# Patient Record
Sex: Male | Born: 1940 | Race: White | Hispanic: No | Smoking: Former smoker
Health system: Southern US, Community
[De-identification: ages and names within clinical notes are randomized; demographics above are authoritative.]

## PROBLEM LIST (undated history)

## (undated) DIAGNOSIS — I420 Dilated cardiomyopathy: Secondary | ICD-10-CM

## (undated) DIAGNOSIS — Z9289 Personal history of other medical treatment: Secondary | ICD-10-CM

## (undated) DIAGNOSIS — D759 Disease of blood and blood-forming organs, unspecified: Secondary | ICD-10-CM

## (undated) DIAGNOSIS — Z9889 Other specified postprocedural states: Secondary | ICD-10-CM

## (undated) DIAGNOSIS — I10 Essential (primary) hypertension: Secondary | ICD-10-CM

## (undated) DIAGNOSIS — E119 Type 2 diabetes mellitus without complications: Secondary | ICD-10-CM

## (undated) DIAGNOSIS — E669 Obesity, unspecified: Secondary | ICD-10-CM

## (undated) DIAGNOSIS — K219 Gastro-esophageal reflux disease without esophagitis: Secondary | ICD-10-CM

## (undated) DIAGNOSIS — C9110 Chronic lymphocytic leukemia of B-cell type not having achieved remission: Principal | ICD-10-CM

## (undated) DIAGNOSIS — E781 Pure hyperglyceridemia: Secondary | ICD-10-CM

## (undated) DIAGNOSIS — D72820 Lymphocytosis (symptomatic): Secondary | ICD-10-CM

## (undated) HISTORY — PX: FOOT SURGERY: SHX648

## (undated) HISTORY — DX: Pure hyperglyceridemia: E78.1

## (undated) HISTORY — PX: NASAL SINUS SURGERY: SHX719

## (undated) HISTORY — DX: Chronic lymphocytic leukemia of B-cell type not having achieved remission: C91.10

## (undated) HISTORY — PX: OTHER SURGICAL HISTORY: SHX169

## (undated) HISTORY — PX: TONSILLECTOMY: SUR1361

## (undated) HISTORY — DX: Obesity, unspecified: E66.9

## (undated) HISTORY — DX: Lymphocytosis (symptomatic): D72.820

## (undated) HISTORY — DX: Personal history of other medical treatment: Z92.89

## (undated) HISTORY — DX: Dilated cardiomyopathy: I42.0

## (undated) HISTORY — DX: Other specified postprocedural states: Z98.890

## (undated) HISTORY — DX: Essential (primary) hypertension: I10

## (undated) HISTORY — DX: Type 2 diabetes mellitus without complications: E11.9

---

## 1998-02-19 ENCOUNTER — Ambulatory Visit (HOSPITAL_COMMUNITY): Admission: RE | Admit: 1998-02-19 | Discharge: 1998-02-19 | Payer: Self-pay | Admitting: Cardiology

## 1998-02-23 ENCOUNTER — Ambulatory Visit (HOSPITAL_COMMUNITY): Admission: RE | Admit: 1998-02-23 | Discharge: 1998-02-23 | Payer: Self-pay | Admitting: Cardiology

## 1998-03-31 ENCOUNTER — Ambulatory Visit (HOSPITAL_COMMUNITY): Admission: RE | Admit: 1998-03-31 | Discharge: 1998-03-31 | Payer: Self-pay | Admitting: Cardiology

## 1998-04-01 ENCOUNTER — Ambulatory Visit (HOSPITAL_COMMUNITY): Admission: RE | Admit: 1998-04-01 | Discharge: 1998-04-01 | Payer: Self-pay | Admitting: Cardiology

## 1998-12-30 ENCOUNTER — Ambulatory Visit (HOSPITAL_COMMUNITY): Admission: RE | Admit: 1998-12-30 | Discharge: 1998-12-30 | Payer: Self-pay | Admitting: Gastroenterology

## 2002-09-03 ENCOUNTER — Encounter: Admission: RE | Admit: 2002-09-03 | Discharge: 2002-09-03 | Payer: Self-pay | Admitting: Internal Medicine

## 2002-09-03 ENCOUNTER — Encounter: Payer: Self-pay | Admitting: Internal Medicine

## 2003-06-03 ENCOUNTER — Ambulatory Visit: Admission: RE | Admit: 2003-06-03 | Discharge: 2003-06-03 | Payer: Self-pay | Admitting: Family Medicine

## 2003-09-30 ENCOUNTER — Encounter: Admission: RE | Admit: 2003-09-30 | Discharge: 2003-12-29 | Payer: Self-pay | Admitting: Internal Medicine

## 2007-02-19 ENCOUNTER — Encounter (HOSPITAL_BASED_OUTPATIENT_CLINIC_OR_DEPARTMENT_OTHER): Admission: RE | Admit: 2007-02-19 | Discharge: 2007-05-20 | Payer: Self-pay | Admitting: Surgery

## 2007-02-21 ENCOUNTER — Encounter: Admission: RE | Admit: 2007-02-21 | Discharge: 2007-02-21 | Payer: Self-pay | Admitting: Internal Medicine

## 2010-01-18 ENCOUNTER — Encounter: Admission: RE | Admit: 2010-01-18 | Discharge: 2010-01-18 | Payer: Self-pay | Admitting: Orthopedic Surgery

## 2011-02-01 NOTE — Assessment & Plan Note (Signed)
[facility]   NAME:  Derek [name], Derek [name] NO.:  [id number]   MEDICAL RECORD NO.:  [id number]      DATE OF BIRTH:  04-Jun-1941   PHYSICIAN:  Epifania Gore. Nils Pyle, M.D. 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  In the interim we have treated him with Aquacel  Silver and a Covaderm dressing.  There has been no excessive drainage or  malodor.  He has not had pain.   OBJECTIVE:  Blood pressure is 145/75.  Respirations 18, pulse rate 73,  temperature 98.1.  Inspection of the right lateral ankle shows that  there has been some contraction in the wound.  There is 100% granulation  with early epithelial advancement from the periphery.   ASSESSMENT:  Clinically improved.   PLAN:  The patient is going on vacation for 2 weeks in [place].  We are  recommending that he utilize antiseptic soap washes twice a day.  Use a  Covaderm dressing and continue to use the modified shoe that is cut out  to avoid pressure over the wound.  We will reevaluate him in 2 weeks.  If he develops increased redness, pain or drainage, he should be  reevaluated in [facility].  The patient seems to understand.  We have  given him an opportunity to ask questions.  He expresses gratitude for  having been seen in the clinic and indicates he will be compliant.      [name], M.D.  Electronically Signed     HAN/MEDQ  D:  03/07/2007  T:  03/07/2007  Job:  [id number]

## 2011-02-01 NOTE — Assessment & Plan Note (Signed)
[facility]   NAME:  Derek, [name] NO.:  [id number]   MEDICAL RECORD NO.:  [id number]      DATE OF BIRTH:  06-Jan-1941   PHYSICIAN:  Epifania Gore. Nils Pyle, M.D.      VISIT DATE:                                   OFFICE VISIT   SUBJECTIVE:  [name] was initially seen on February 19, 2007 with a  neuropathic ulcer involving the right ankle.  The wound was debrided in  the clinic and we provided him with pressure relief, i.e. offloading by  utilizing a healing sandal and a bulky dressing.  He returns for  followup.  He denies interim pain, excessive drainage, malodor or fever.   OBJECTIVE:  Blood pressure is 134/81, respirations are 20, pulse rate  63, temperature 98.4.  The patient is unaccompanied.   Inspection of the right lower extremity and foot shows that the wound,  in fact, appears cleaner.  There is no evidence of infection.  There  appears to be healthy well-perfused tissue in the previous area of  debridement.  No epithelialization is evident at this point.  The ankle  brachial index performed today in the clinic is 1.31 and 0.93 on the  left and right, respectively.   IMPRESSION:  Clinical improvement responding to adequate offloading.   PLAN:  The patient is insistent upon wearing a cut away of his regular  shoe; this will allow him to continue to work.  For the wound itself, we  would dress it with a double Prisma dressing, hydrogel and Covaderm.  The patient will cut away his shoe to provide for no compression  whatsoever over the wound.  We will reevaluate the patient in one week.  We have counseled the patient with regard to the possibility of  excessive drainage or fever.  He is to notify the clinic if there is any  difficulty whatsoever and he is also to bring his modified shoe with him  when he returns to the clinic.  We have given him an opportunity to ask  questions, he seems to understand and indicates that he will be  compliant.      [name], M.D.  Electronically Signed     HAN/MEDQ  D:  02/21/2007  T:  02/21/2007  Job:  [id number]

## 2011-02-01 NOTE — Consult Note (Signed)
NAME:  Derek [name], Derek [name]                   ACCOUNT NO.:  [id number]   MEDICAL RECORD NO.:  [id number]          PATIENT TYPE:  REC   LOCATION:  FOOT                         FACILITY:  [facility]   PHYSICIAN:  Epifania Gore. Nils Pyle, M.D.DATE OF BIRTH:  1941/02/09   DATE OF CONSULTATION:  DATE OF DISCHARGE:                                 CONSULTATION   SUBJECTIVE:  Derek [name] is a 70 year old man referred by Dr. Deforest Hoyles for  evaluation of a non-healing ulceration on the lateral right foot.   IMPRESSION:  Neuropathic ulcer.   RECOMMENDATIONS:  We have initiated debridement in the wound center,  followed by the placement of Promogran with hydrogel.  We will offload  the patient with a healing sandal and re-evaluate him in 2-3 days, to  assess his response to the therapy.   SUBJECTIVE:  Derek [name] is a 70 year old man who underwent a plastic re-  construction of his right foot approximately 15 years ago.  The  resulting repair gave him a grossly deformed foot.   FOLLOWUP:  In an attempt to increase his activity, he started walking  more and noted a rub at the inferior border of the lateral malleolus.  The wound continued to enlarge, in spite of the application of topical  agents.  He was seen by Dr. Deforest Hoyles, who referred him to the wound  center for consultation.   PAST MEDICAL HISTORY:  Remarkable for the patient's hypertension.  He is  a borderline diabetic according to the patient.  He is on no oral  hypoglycemic agents, and he does not take insulin.   PREVIOUS SURGERY:  His previous surgery has included only the plastic  reconstruction of the foot and a vasectomy.   ALLERGIES:  HE DENIES ALLERGIES.   CURRENT MEDICATIONS:  Include lisinopril, HCTZ 5 mg daily, fish oil p.o.  daily and aspirin 81 mg daily.   FAMILY HISTORY:  Is positive for vascular disease, diabetes and heart  attack.   SOCIAL HISTORY:  Socially, he is married.  He does not smoke.  Drinks  occasional alcohol.  Has several  children who live in the local area.   REVIEW OF SYSTEMS:  Is remarkable for some recent a right shoulder and  chest discomfort.  He has been seen by Dr. Chase Picket and has been  told that this most likely represents a musculoskeletal pain.  He denies  bowel or bladder dysfunction.  His weight has increased 20 pounds, over  the last year.  He denies TIAs or transient paralysis.  There is no  dyspnea on exertion and no orthopnea.  The remainder of the review of  systems is negative.   PHYSICAL EXAM:  GENERAL:  He is alert, oriented, in no acute distress.  VITAL SIGNS:  Blood pressure is 163/87, respirations are 20, pulse rate  72, temperature of 98.  Capillary blood glucoses 118 mg percent.  HEENT:  Exam is clear.  NECK:  Supple.  Trachea is midline.  Thyroid is nonpalpable.  LUNGS:  Clear.  The heart sounds are normal.  ABDOMEN:  Soft.  EXTREMITY:  Exam is remarkable for mild changes of stasis bilaterally.  There is a bounding 3+ dorsalis pedis pulse on the left, with intact  sensation on the right.  The postsurgical changes of a myocutaneous flap  to the dorsal lateral aspect of the right foot are evident.  At the  lateral edge of the flap is an open ulcerated area, which was  photographed and measured and catalogued.  This wound had some non-  viable tissue at the periphery and the central areas.  This underwent a  short debridement, without particular tenderness.  There was resultant  bleeding.  There was no evidence of infection.  Capillary refill was  normal.  Neurologically, sensation was retained on the volar aspect of  the foot.  There were areas of decreased sensation associated with the  grafted skin.   DISCUSSION:  The patient's wound appears to be directly related to the  relatively insensate status of the wound and likely related to repeated  trauma from mal-fitting shoes.  Our initial attempt will be to offload  the friction-bearing area by placing the patient in a  bulky wrap with a  matrix dressing and a healing sandal.  We will re-evaluate him and 3  days.  We have explained this approach to the patient, in terms that he  seems to understand.  Our long-term objective will be to get this wound  initially healed and to have him placed in customized footwear.  The  patient seems to understand.  We have given him an opportunity to ask  questions.  He expresses gratitude for having been seen in the clinic  and indicates that he will be compliant as outlined above.      [name], M.D.  Electronically Signed     HAN/MEDQ  D:  02/19/2007  T:  02/19/2007  Job:  [id number]   cc:   Wenda Low, MD

## 2011-02-01 NOTE — Assessment & Plan Note (Signed)
[facility]   NAME:  KATELYN, BRAXTON NO.:  [id number]   MEDICAL RECORD NO.:  [id number]      DATE OF BIRTH:  [birth year]   PHYSICIAN:  Epifania Gore. Nils Pyle, M.D.      VISIT DATE:                                   OFFICE VISIT   SUBJECTIVE:  Mr. Talamo is a 70 year old man who we are following for a  right lateral ankle neuropathic ulcer.  In the interim, he has made a  modification of his athletic shoe to avoid pressure and has continued to  wear a moist dressing and to utilize a daily shower.  There has been no  excessive drainage, malodor, pain, or fever.   OBJECTIVE:  The blood pressure is 123/68, respirations are 18, pulse  rate 72, temperature 98.4.  Inspection of the wound shows that there is  the development of healthy appearing granulation at the base of the  wound.  There is no definite epithelialization at present.  There is no  evidence of infection or vascular compromise.   ASSESSMENT:  Clinical improvement.   PLAN:  We will continue the modified athletic shoe for off-loading.  We  will begin an Aquacel silver with a coverdell dressing and re-evaluate  the patient in one week.      [name], M.D.  Electronically Signed     HAN/MEDQ  D:  02/28/2007  T:  02/28/2007  Job:  [id number]

## 2011-12-21 DIAGNOSIS — E669 Obesity, unspecified: Secondary | ICD-10-CM | POA: Diagnosis not present

## 2011-12-21 DIAGNOSIS — I10 Essential (primary) hypertension: Secondary | ICD-10-CM | POA: Diagnosis not present

## 2011-12-21 DIAGNOSIS — R21 Rash and other nonspecific skin eruption: Secondary | ICD-10-CM | POA: Diagnosis not present

## 2011-12-21 DIAGNOSIS — Z1331 Encounter for screening for depression: Secondary | ICD-10-CM | POA: Diagnosis not present

## 2011-12-21 DIAGNOSIS — E782 Mixed hyperlipidemia: Secondary | ICD-10-CM | POA: Diagnosis not present

## 2011-12-21 DIAGNOSIS — E119 Type 2 diabetes mellitus without complications: Secondary | ICD-10-CM | POA: Diagnosis not present

## 2011-12-21 DIAGNOSIS — N529 Male erectile dysfunction, unspecified: Secondary | ICD-10-CM | POA: Diagnosis not present

## 2012-01-03 DIAGNOSIS — I10 Essential (primary) hypertension: Secondary | ICD-10-CM | POA: Diagnosis not present

## 2012-01-03 DIAGNOSIS — E119 Type 2 diabetes mellitus without complications: Secondary | ICD-10-CM | POA: Diagnosis not present

## 2012-01-03 DIAGNOSIS — G479 Sleep disorder, unspecified: Secondary | ICD-10-CM | POA: Diagnosis not present

## 2012-01-13 DIAGNOSIS — N529 Male erectile dysfunction, unspecified: Secondary | ICD-10-CM | POA: Diagnosis not present

## 2012-03-08 DIAGNOSIS — IMO0002 Reserved for concepts with insufficient information to code with codable children: Secondary | ICD-10-CM | POA: Diagnosis not present

## 2012-06-18 DIAGNOSIS — E119 Type 2 diabetes mellitus without complications: Secondary | ICD-10-CM | POA: Diagnosis not present

## 2012-06-18 DIAGNOSIS — E669 Obesity, unspecified: Secondary | ICD-10-CM | POA: Diagnosis not present

## 2012-06-18 DIAGNOSIS — E782 Mixed hyperlipidemia: Secondary | ICD-10-CM | POA: Diagnosis not present

## 2012-06-18 DIAGNOSIS — L989 Disorder of the skin and subcutaneous tissue, unspecified: Secondary | ICD-10-CM | POA: Diagnosis not present

## 2012-06-18 DIAGNOSIS — I10 Essential (primary) hypertension: Secondary | ICD-10-CM | POA: Diagnosis not present

## 2012-07-09 DIAGNOSIS — H2510 Age-related nuclear cataract, unspecified eye: Secondary | ICD-10-CM | POA: Diagnosis not present

## 2012-07-09 DIAGNOSIS — E119 Type 2 diabetes mellitus without complications: Secondary | ICD-10-CM | POA: Diagnosis not present

## 2012-07-11 DIAGNOSIS — L570 Actinic keratosis: Secondary | ICD-10-CM | POA: Diagnosis not present

## 2012-09-26 DIAGNOSIS — Z23 Encounter for immunization: Secondary | ICD-10-CM | POA: Diagnosis not present

## 2012-12-18 DIAGNOSIS — R0602 Shortness of breath: Secondary | ICD-10-CM | POA: Diagnosis not present

## 2012-12-18 DIAGNOSIS — E785 Hyperlipidemia, unspecified: Secondary | ICD-10-CM | POA: Diagnosis not present

## 2012-12-18 DIAGNOSIS — E119 Type 2 diabetes mellitus without complications: Secondary | ICD-10-CM | POA: Diagnosis not present

## 2012-12-18 DIAGNOSIS — E669 Obesity, unspecified: Secondary | ICD-10-CM | POA: Diagnosis not present

## 2012-12-18 DIAGNOSIS — N529 Male erectile dysfunction, unspecified: Secondary | ICD-10-CM | POA: Diagnosis not present

## 2012-12-18 DIAGNOSIS — Z0000 Encounter for general adult medical examination without abnormal findings: Secondary | ICD-10-CM | POA: Diagnosis not present

## 2012-12-18 DIAGNOSIS — I10 Essential (primary) hypertension: Secondary | ICD-10-CM | POA: Diagnosis not present

## 2012-12-18 DIAGNOSIS — Z1331 Encounter for screening for depression: Secondary | ICD-10-CM | POA: Diagnosis not present

## 2012-12-27 DIAGNOSIS — R0602 Shortness of breath: Secondary | ICD-10-CM | POA: Diagnosis not present

## 2012-12-27 DIAGNOSIS — R079 Chest pain, unspecified: Secondary | ICD-10-CM | POA: Diagnosis not present

## 2012-12-27 DIAGNOSIS — E119 Type 2 diabetes mellitus without complications: Secondary | ICD-10-CM | POA: Diagnosis not present

## 2012-12-31 DIAGNOSIS — E119 Type 2 diabetes mellitus without complications: Secondary | ICD-10-CM | POA: Diagnosis not present

## 2012-12-31 DIAGNOSIS — R9439 Abnormal result of other cardiovascular function study: Secondary | ICD-10-CM | POA: Diagnosis not present

## 2012-12-31 DIAGNOSIS — I10 Essential (primary) hypertension: Secondary | ICD-10-CM | POA: Diagnosis not present

## 2012-12-31 DIAGNOSIS — R0602 Shortness of breath: Secondary | ICD-10-CM | POA: Diagnosis not present

## 2012-12-31 DIAGNOSIS — E785 Hyperlipidemia, unspecified: Secondary | ICD-10-CM | POA: Diagnosis not present

## 2013-01-11 ENCOUNTER — Other Ambulatory Visit: Payer: Self-pay | Admitting: Cardiology

## 2013-01-14 DIAGNOSIS — R079 Chest pain, unspecified: Secondary | ICD-10-CM | POA: Diagnosis not present
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
## 2013-01-17 ENCOUNTER — Inpatient Hospital Stay (HOSPITAL_BASED_OUTPATIENT_CLINIC_OR_DEPARTMENT_OTHER)
Admission: RE | Admit: 2013-01-17 | Discharge: 2013-01-17 | Disposition: A | Discharge status: Home / Self Care | Payer: Medicare Other | Source: Ambulatory Visit | Attending: Cardiology | Admitting: Cardiology

## 2013-01-17 ENCOUNTER — Encounter (HOSPITAL_BASED_OUTPATIENT_CLINIC_OR_DEPARTMENT_OTHER)
Admission: RE | Disposition: A | Discharge status: Home / Self Care | Payer: Self-pay | Source: Ambulatory Visit | Attending: Cardiology

## 2013-01-17 ENCOUNTER — Encounter (HOSPITAL_BASED_OUTPATIENT_CLINIC_OR_DEPARTMENT_OTHER): Payer: Self-pay | Admitting: *Deleted

## 2013-01-17 DIAGNOSIS — R9430 Abnormal result of cardiovascular function study, unspecified: Secondary | ICD-10-CM | POA: Diagnosis not present

## 2013-01-17 DIAGNOSIS — I2510 Atherosclerotic heart disease of native coronary artery without angina pectoris: Secondary | ICD-10-CM | POA: Insufficient documentation

## 2013-01-17 DIAGNOSIS — I209 Angina pectoris, unspecified: Secondary | ICD-10-CM | POA: Diagnosis not present

## 2013-01-17 DIAGNOSIS — I10 Essential (primary) hypertension: Secondary | ICD-10-CM | POA: Insufficient documentation

## 2013-01-17 DIAGNOSIS — R9439 Abnormal result of other cardiovascular function study: Secondary | ICD-10-CM | POA: Insufficient documentation

## 2013-01-17 DIAGNOSIS — E119 Type 2 diabetes mellitus without complications: Secondary | ICD-10-CM | POA: Insufficient documentation

## 2013-01-17 DIAGNOSIS — Z6841 Body Mass Index (BMI) 40.0 and over, adult: Secondary | ICD-10-CM | POA: Insufficient documentation

## 2013-01-17 DIAGNOSIS — E785 Hyperlipidemia, unspecified: Secondary | ICD-10-CM | POA: Insufficient documentation

## 2013-01-17 DIAGNOSIS — R0602 Shortness of breath: Secondary | ICD-10-CM | POA: Insufficient documentation

## 2013-01-17 LAB — POCT I-STAT GLUCOSE
Glucose, Bld: 128 mg/dL — ABNORMAL HIGH (ref 70–99)
Operator id: 274661

## 2013-01-17 SURGERY — JV LEFT HEART CATHETERIZATION WITH CORONARY ANGIOGRAM
Anesthesia: Moderate Sedation

## 2013-01-17 MED ORDER — ONDANSETRON HCL 4 MG/2ML IJ SOLN: 4.0000 mg | Freq: Four times a day (QID) | INTRAMUSCULAR | Status: DC | PRN

## 2013-01-17 MED ORDER — METOPROLOL SUCCINATE ER 50 MG PO TB24: 50.0000 mg | ORAL_TABLET | Freq: Every day | ORAL | Status: DC

## 2013-01-17 MED ORDER — SODIUM CHLORIDE 0.9 % IV SOLN: 1.0000 mL/kg/h | INTRAVENOUS | Status: AC

## 2013-01-17 MED ORDER — ACETAMINOPHEN 325 MG PO TABS: 650.0000 mg | ORAL_TABLET | ORAL | Status: DC | PRN

## 2013-01-17 MED ORDER — ASPIRIN 81 MG PO CHEW
324.0000 mg | CHEWABLE_TABLET | ORAL | Status: AC
  Administered 2013-01-17: 324 mg via ORAL

## 2013-01-17 MED ORDER — SODIUM CHLORIDE 0.9 % IV SOLN: 250.0000 mL | INTRAVENOUS | Status: DC | PRN

## 2013-01-17 MED ORDER — SODIUM CHLORIDE 0.9 % IV SOLN
INTRAVENOUS | Status: DC
  Administered 2013-01-17: 09:00:00 via INTRAVENOUS

## 2013-01-17 MED ORDER — SODIUM CHLORIDE 0.9 % IJ SOLN: 3.0000 mL | Freq: Two times a day (BID) | INTRAMUSCULAR | Status: DC

## 2013-01-17 MED ORDER — SODIUM CHLORIDE 0.9 % IJ SOLN: 3.0000 mL | INTRAMUSCULAR | Status: DC | PRN

## 2013-01-17 MED ORDER — ISOSORBIDE MONONITRATE ER 30 MG PO TB24: 30.0000 mg | ORAL_TABLET | Freq: Every day | ORAL | Status: DC

## 2013-01-17 MED ORDER — DIAZEPAM 5 MG PO TABS
5.0000 mg | ORAL_TABLET | ORAL | Status: AC
  Administered 2013-01-17: 5 mg via ORAL

## 2013-01-17 NOTE — Interval H&P Note (Signed)
History and Physical Interval Note:  01/17/2013 9:39 AM  Derek [name]  has presented today for surgery, with the diagnosis of abn stress test  The various methods of treatment have been discussed with the patient and family. After consideration of risks, benefits and other options for treatment, the patient has consented to  Procedure(s): JV LEFT HEART CATHETERIZATION WITH CORONARY ANGIOGRAM (N/A) as a surgical intervention .  The patient's history has been reviewed, patient examined, no change in status, stable for surgery.  I have reviewed the patient's chart and labs.  Questions were answered to the patient's satisfaction.     Ewald Beg
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
## 2013-01-17 NOTE — CV Procedure (Signed)
CARDIAC CATHETERIZATION  PROCEDURE:  Left heart catheterization with selective coronary angiography, left ventriculogram via the radial artery approach.  INDICATIONS:  72 year old male with diabetes, hypertension, morbid obesity with weight of 318 pounds, who was referred for stress test secondary to increasing dyspnea on exertion with right upper chest/shoulder discomfort upon minimal activity, CCS class 2-3 angina. His stress test was overall moderate risk with inferolateral mid to apex ischemia.  The risks, benefits, and details of the procedure were explained to the patient, including possibilities of stroke, heart attack, death, renal impairment, arterial damage, bleeding.  The patient verbalized understanding and wanted to proceed.  Informed written consent was obtained.  PROCEDURE TECHNIQUE:  Allen's test was performed pre-and post procedure and was normal. The right radial artery site was prepped and draped in a sterile fashion. One percent lidocaine was used for local anesthesia. Using the modified Seldinger technique a 5 French hydrophilic sheath was inserted into the radial artery without difficulty. 3 mg of verapamil was administered via the sheath. A Judkins right #4 catheter with the guidance of a Versicore wire was placed in the right coronary cusp and selectively cannulated the right coronary artery. After traversing the aortic arch, 5000 units of heparin IV was administered. A Judkins left #3.5 catheter was used to selectively cannulate the left main artery. Multiple views with hand injection of Omnipaque were obtained. Catheter a pigtail catheter was used to cross into the left ventricle, hemodynamics were obtained, and a left ventriculogram was performed in the RAO position with power injection. Following the procedure, sheath was removed, patient was hemodynamically stable, hemostasis was maintained with a Terumo T band.   CONTRAST:  Total of 75 ml.    FLOUROSCOPY TIME: 4.0  min.  COMPLICATIONS:  None.    HEMODYNAMICS:  Aortic pressure was 123456 mmHg; LV systolic pressure was 123456 mmHg; LVEDP 6 mmHg.  There was no gradient between the left ventricle and aorta.    ANGIOGRAPHIC DATA:    Left main: Widely patent vessel giving rise to LAD and circumflex.  Left anterior descending (LAD): The proximal vessel demonstrates only minor luminal irregularities. In the mid vessel, there is a bifurcation of a large diagonal branch. Immediately after this bifurcation, there is LAD stenosis of approximately 85%, then 15 mm distally, stenosis of 95%, calcified, then in the distal region of the vessel, there is diffuse disease with after nitroglycerin demonstrates 2-3 focal regions of 80-90% stenosis in the small caliber distal vessel. The large diagonal branch mentioned above, demonstrates 2 focal areas of 85% stenosis in the proximal/mid vessel.  Circumflex artery (CIRC): The circumflex artery in its proximal region demonstrates minor luminal irregularities then in obtuse marginal 2 demonstrates a mid occlusion with apparent left to left collaterals filling retrograde. The mid to distal segment of the circumflex is fairly small in caliber.  Right coronary artery (RCA): Dominant vessel giving rise to the posterior descending artery. Minor luminal irregularities throughout the proximal to distal RCA. There is a small caliber posterior lateral branch with 90% proximal stenosis. There is also collateral blood flow, right to right filling a segmental posterior lateral branch.  LEFT VENTRICULOGRAM:  Left ventricular angiogram was done in the 30 RAO projection and revealed normal left ventricular wall motion and systolic function with an estimated ejection fraction of 55%.   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 Nitroglycerin administered with mild increase in caliber of the distal vessel. There is also small caliber posterior lateral branch 90% proximal stenosis, collateral blood flow. There is small caliber obtuse marginal branch stenosis/occlusion as well with left to left collaterals filling retrograde. See above for details. Normal left ventricular systolic function.  LVEDP 6 mmHg.  Ejection fraction 55%. No significant wall motion abnormalities.  RECOMMENDATION:  We will first initiate metoprolol as well as isosorbide to provide anti anginal therapy. We will reevaluate next week. I discussed case with Dr. Irish Lack of interventional cardiology. We discussed the potential plan for percutaneous intervention to the mid LAD segment as well as intervention to the proximal/mid large caliber diagonal branch and potential balloon angioplasty to distal LAD. Once again, nuclear stress test was deemed moderate risk-he did have mild electrical instability with brief run of possible ventricular tachycardia that was low and he was also noted to have significant dyspnea on exertion with minimal effort, CCS class III.

## 2013-01-17 NOTE — Progress Notes (Signed)
Pt received from cath procedure, alert and oriented, denies any discomfort, and  with right radial site intact, and no problems at site.  Pt tolerated fluid well.  Dr Derl Barrow in to talk to pt and family about the results of test and plan of care.

## 2013-01-24 DIAGNOSIS — I2510 Atherosclerotic heart disease of native coronary artery without angina pectoris: Secondary | ICD-10-CM | POA: Diagnosis not present

## 2013-01-24 DIAGNOSIS — E119 Type 2 diabetes mellitus without complications: Secondary | ICD-10-CM | POA: Diagnosis not present

## 2013-01-24 DIAGNOSIS — I10 Essential (primary) hypertension: Secondary | ICD-10-CM | POA: Diagnosis not present

## 2013-01-24 DIAGNOSIS — E782 Mixed hyperlipidemia: Secondary | ICD-10-CM | POA: Diagnosis not present

## 2013-02-13 DIAGNOSIS — D72829 Elevated white blood cell count, unspecified: Secondary | ICD-10-CM | POA: Diagnosis not present

## 2013-02-15 ENCOUNTER — Telehealth: Payer: Self-pay | Admitting: Oncology

## 2013-02-15 NOTE — Telephone Encounter (Signed)
LVOM FOR PT TO RETURN CALL IN RE NP APPT.  °

## 2013-02-18 ENCOUNTER — Telehealth: Payer: Self-pay | Admitting: Oncology

## 2013-02-18 NOTE — Telephone Encounter (Signed)
S/W PT IN RE TO NP APPT 06/23 @ 10:30 W/DR. HA REFERRING DR. D[place] Ar HUSAIN DX- [place] PACKET MAILED.

## 2013-02-18 NOTE — Telephone Encounter (Signed)
C/D 02/18/13 for appt. 03/11/13

## 2013-03-11 ENCOUNTER — Other Ambulatory Visit (HOSPITAL_BASED_OUTPATIENT_CLINIC_OR_DEPARTMENT_OTHER): Payer: Medicare Other | Admitting: Lab

## 2013-03-11 ENCOUNTER — Ambulatory Visit: Payer: Medicare Other

## 2013-03-11 ENCOUNTER — Encounter: Payer: Self-pay | Admitting: Oncology

## 2013-03-11 ENCOUNTER — Other Ambulatory Visit (HOSPITAL_COMMUNITY)
Admission: RE | Admit: 2013-03-11 | Discharge: 2013-03-11 | Disposition: A | Discharge status: Home / Self Care | Payer: Medicare Other | Source: Ambulatory Visit | Attending: Oncology | Admitting: Oncology

## 2013-03-11 ENCOUNTER — Ambulatory Visit (HOSPITAL_BASED_OUTPATIENT_CLINIC_OR_DEPARTMENT_OTHER): Payer: Medicare Other | Admitting: Oncology

## 2013-03-11 ENCOUNTER — Telehealth: Payer: Self-pay | Admitting: Oncology

## 2013-03-11 VITALS — BP 130/72 | HR 62 | Temp 96.9°F | Resp 19 | Ht 76.0 in | Wt 318.6 lb

## 2013-03-11 DIAGNOSIS — D72820 Lymphocytosis (symptomatic): Secondary | ICD-10-CM

## 2013-03-11 DIAGNOSIS — D72819 Decreased white blood cell count, unspecified: Secondary | ICD-10-CM | POA: Diagnosis not present

## 2013-03-11 LAB — CBC WITH DIFFERENTIAL/PLATELET
BASO%: 0.8 % (ref 0.0–2.0)
Basophils Absolute: 0.1 10*3/uL (ref 0.0–0.1)
EOS%: 0.6 % (ref 0.0–7.0)
Eosinophils Absolute: 0.1 10*3/uL (ref 0.0–0.5)
HCT: 39.5 % (ref 38.4–49.9)
HGB: 13.5 g/dL (ref 13.0–17.1)
LYMPH%: 51.1 % — ABNORMAL HIGH (ref 14.0–49.0)
MCH: 30.2 pg (ref 27.2–33.4)
MCHC: 34.2 g/dL (ref 32.0–36.0)
MCV: 88.2 fL (ref 79.3–98.0)
MONO#: 0.6 10*3/uL (ref 0.1–0.9)
MONO%: 4.6 % (ref 0.0–14.0)
NEUT#: 5.9 10*3/uL (ref 1.5–6.5)
NEUT%: 42.9 % (ref 39.0–75.0)
Platelets: 175 10*3/uL (ref 140–400)
RBC: 4.47 10*6/uL (ref 4.20–5.82)
RDW: 13.8 % (ref 11.0–14.6)
WBC: 13.9 10*3/uL — ABNORMAL HIGH (ref 4.0–10.3)
lymph#: 7.1 10*3/uL — ABNORMAL HIGH (ref 0.9–3.3)

## 2013-03-11 LAB — MORPHOLOGY: PLT EST: ADEQUATE

## 2013-03-11 LAB — CHCC SMEAR

## 2013-03-11 LAB — COMPREHENSIVE METABOLIC PANEL (CC13)
ALT: 12 U/L (ref 0–55)
AST: 14 U/L (ref 5–34)
Albumin: 3.6 g/dL (ref 3.5–5.0)
Alkaline Phosphatase: 62 U/L (ref 40–150)
BUN: 11.1 mg/dL (ref 7.0–26.0)
CO2: 25 mEq/L (ref 22–29)
Calcium: 9.7 mg/dL (ref 8.4–10.4)
Chloride: 101 mEq/L (ref 98–107)
Creatinine: 1.1 mg/dL (ref 0.7–1.3)
Glucose: 173 mg/dl — ABNORMAL HIGH (ref 70–99)
Potassium: 4.2 mEq/L (ref 3.5–5.1)
Sodium: 137 mEq/L (ref 136–145)
Total Bilirubin: 1 mg/dL (ref 0.20–1.20)
Total Protein: 7.2 g/dL (ref 6.4–8.3)

## 2013-03-11 LAB — LACTATE DEHYDROGENASE (CC13): LDH: 109 U/L — ABNORMAL LOW (ref 125–245)

## 2013-03-11 NOTE — Progress Notes (Signed)
[place]  Telephone:(336) [phone] Fax:(336) [place]    Referral MD:  Wenda Low, M.D.  Reason for Referral:  Lymphocyte-predominant lymphocytosis.     HPI:  Derek [name] is a 72 year-old man with no significant PMH.  He was noted to have leukocytosis on CBC on 01/14/13 with WBC 15.2; Hgb 14; plt 186.  He was kindly referred to the [facility] for evaluation.  Mr. Kishi presented to the clinic for the first time today with a daughter-in-law.  He denied any problem.  He denied fever, anorexia, weight loss, fatigue, headache, visual changes, confusion, drenching night sweats, palpable lymph node swelling, mucositis, odynophagia, dysphagia, nausea vomiting, jaundice, chest pain, palpitation, shortness of breath, dyspnea on exertion, productive cough, gum bleeding, epistaxis, hematemesis, hemoptysis, abdominal pain, abdominal swelling, early satiety, melena, hematochezia, hematuria, skin rash, spontaneous bleeding, joint swelling, joint pain, heat or cold intolerance, bowel bladder incontinence, back pain, focal motor weakness, paresthesia, depression.      Past Medical History  Diagnosis Date  . Lymphocytosis   . Hypertension   . Diabetes mellitus, type II   :    Past Surgical History  Procedure Laterality Date  . Foot surgery Right   . Vericose vein stripping    . Nasal sinus surgery    :   CURRENT MEDS: Current Outpatient Prescriptions  Medication Sig Dispense Refill  . atorvastatin (LIPITOR) 40 MG tablet Take 40 mg by mouth daily.      . Cholecalciferol (VITAMIN D-3 PO) Take by mouth.      . fish oil-omega-3 fatty acids 1000 MG capsule Take 1 g by mouth daily.      . isosorbide mononitrate (IMDUR) 30 MG 24 hr tablet Take 1 tablet (30 mg total) by mouth daily.  30 tablet  12  . lisinopril-hydrochlorothiazide (PRINZIDE,ZESTORETIC) 10-12.5 MG per tablet Take 1 tablet by mouth daily.      . metFORMIN  (GLUCOPHAGE) 500 MG tablet Take 500 mg by mouth 3 (three) times daily with meals.      . metoprolol succinate (TOPROL XL) 50 MG 24 hr tablet Take 1 tablet (50 mg total) by mouth daily. Take with or immediately following a meal.  30 tablet  12   No current facility-administered medications for this visit.      No Known Allergies:  Family History  Problem Relation Age of Onset  . Heart attack Mother   . Cancer Father     liver  :  History   Social History  . Marital Status: Married    Spouse Name: N/A    Number of Children: 3  . Years of Education: N/A   Occupational History  .      retied Audiological scientist; Materials engineer work    Social History Main Topics  . Smoking status: Former Smoker -- 3.00 packs/day for 20 years    Quit date: 09/19/1988  . Smokeless tobacco: Never Used  . Alcohol Use: Yes  . Drug Use: Not on file  . Sexually Active: Not on file   Other Topics Concern  . Not on file   Social History Narrative  . No narrative on file  :  REVIEW OF SYSTEM:  The rest of the 14-point review of sytem was negative.   Exam: ECOG 0  General:  Obese man, in no acute distress.  Eyes:  no scleral icterus.  ENT:  There were no oropharyngeal lesions.  Neck was without thyromegaly.  Lymphatics:  Negative cervical, supraclavicular or axillary adenopathy.  Respiratory: lungs were clear bilaterally without wheezing or crackles.  Cardiovascular:  Regular rate and rhythm, S1/S2, without murmur, rub or gallop.  There was no pedal edema.  GI:  abdomen was soft, flat, nontender, nondistended, without organomegaly.  Muscoloskeletal:  no spinal tenderness of palpation of vertebral spine.  Skin exam was without echymosis, petichae.  Neuro exam was nonfocal.  Patient was able to get on and off exam table without assistance.  Gait was normal.  Patient was alerted and oriented.  Attention was good.   Language was appropriate.  Mood was normal without depression.  Speech was not pressured.  Thought content was  not tangential.    LABS:  Lab Results  Component Value Date   WBC 13.9* 03/11/2013   HGB 13.5 03/11/2013   HCT 39.5 03/11/2013   PLT 175 03/11/2013   GLUCOSE 173* 03/11/2013   ALT 12 03/11/2013   AST 14 03/11/2013   NA 137 03/11/2013   K 4.2 03/11/2013   CL 101 03/11/2013   CREATININE 1.1 03/11/2013   BUN 11.1 03/11/2013   CO2 25 03/11/2013    Blood smear review:   I personally reviewed the patient's peripheral blood smear today.  There was isocytosis.  There was no peripheral blast.  There was moderate atypical lymphocytes. There was no schistocytosis, spherocytosis, target cell, rouleaux formation, tear drop cell.  There was no giant platelets or platelet clumps.      ASSESSMENT AND PLAN:   1.  Issue:  Elevated white blood cell. 2.  Potential causes:  Chronic leukemia/lymphoma such as CLL (chronic lymphocytic leukemia).  This can also be due to chronic, benign elevation in white blood cell not related to any bone marrow problem.  3.  Work up:  Peripheral blood flow cytometry to determine the diagnosis. 4.  If CLL is confirmed, I recommend watchful observation and monitor.  Patients with CLL need to be treated when they have severe constitutional symptoms, low blood count, massive lymph nodes, and recurrent infection. 5.  Follow up:  In about 4 months.   I informed Derek [name] that I am leaving the practice.  The [facility] will arrange for him to see another provider when he returns.    The length of time of the face-to-face encounter was 30 minutes. More than 50% of time was spent counseling and coordination of care.     Thank you for this referral.

## 2013-03-11 NOTE — Patient Instructions (Addendum)
1.  Issue:  Elevated white blood cell. 2.  Potential causes:  Chronic (not acute) leukemia/lymphoma such as CLL (chronic lymphocytic leukemia).  This can also be chronic, benign elevation in white blood cell not related to any bone marrow problem.  3.  Work up:  Peripheral blood flow cytometry to determine the diagnosis. 4.  If CLL is confirmed, I recommend watchful observation and monitor.  Patients with CLL need to be treated when they have severe constitutional symptoms, low blood count, massive lymph nodes, and recurrent infection. 5.  Follow up:  In about 4 months.

## 2013-03-11 NOTE — Progress Notes (Signed)
Checked in new patient. Wants all communication via phone/mail. No POA/living will. No financial issues. He will be ordering new medicare and BCBS card. All scanned in.

## 2013-03-13 ENCOUNTER — Encounter: Payer: Self-pay | Admitting: Oncology

## 2013-03-13 LAB — FLOW CYTOMETRY

## 2013-03-15 DIAGNOSIS — E785 Hyperlipidemia, unspecified: Secondary | ICD-10-CM | POA: Diagnosis not present

## 2013-03-15 DIAGNOSIS — I10 Essential (primary) hypertension: Secondary | ICD-10-CM | POA: Diagnosis not present

## 2013-03-15 DIAGNOSIS — E669 Obesity, unspecified: Secondary | ICD-10-CM | POA: Diagnosis not present

## 2013-03-15 DIAGNOSIS — E782 Mixed hyperlipidemia: Secondary | ICD-10-CM | POA: Diagnosis not present

## 2013-03-15 DIAGNOSIS — I2510 Atherosclerotic heart disease of native coronary artery without angina pectoris: Secondary | ICD-10-CM | POA: Diagnosis not present

## 2013-04-23 ENCOUNTER — Telehealth: Payer: Self-pay | Admitting: Oncology

## 2013-04-23 NOTE — Telephone Encounter (Signed)
pt called to set up appt.Marland KitchenMarland KitchenDone.Marland Kitchenok per Lanelle Bal

## 2013-04-24 ENCOUNTER — Telehealth: Payer: Self-pay | Admitting: Hematology and Oncology

## 2013-04-24 ENCOUNTER — Other Ambulatory Visit: Payer: Medicare Other | Admitting: Lab

## 2013-04-24 ENCOUNTER — Ambulatory Visit (HOSPITAL_BASED_OUTPATIENT_CLINIC_OR_DEPARTMENT_OTHER): Payer: Medicare Other | Admitting: Hematology and Oncology

## 2013-04-24 ENCOUNTER — Ambulatory Visit (HOSPITAL_BASED_OUTPATIENT_CLINIC_OR_DEPARTMENT_OTHER): Payer: Medicare Other | Admitting: Lab

## 2013-04-24 VITALS — BP 158/80 | HR 92 | Temp 98.4°F | Resp 20 | Ht 76.0 in | Wt 316.3 lb

## 2013-04-24 DIAGNOSIS — R5381 Other malaise: Secondary | ICD-10-CM

## 2013-04-24 DIAGNOSIS — R5383 Other fatigue: Secondary | ICD-10-CM | POA: Diagnosis not present

## 2013-04-24 DIAGNOSIS — D72829 Elevated white blood cell count, unspecified: Secondary | ICD-10-CM | POA: Diagnosis not present

## 2013-04-24 DIAGNOSIS — C9110 Chronic lymphocytic leukemia of B-cell type not having achieved remission: Secondary | ICD-10-CM | POA: Diagnosis not present

## 2013-04-24 LAB — CBC WITH DIFFERENTIAL/PLATELET
BASO%: 0.6 % (ref 0.0–2.0)
Basophils Absolute: 0.1 10*3/uL (ref 0.0–0.1)
EOS%: 0.6 % (ref 0.0–7.0)
Eosinophils Absolute: 0.1 10*3/uL (ref 0.0–0.5)
HCT: 40 % (ref 38.4–49.9)
HGB: 13.6 g/dL (ref 13.0–17.1)
LYMPH%: 52 % — ABNORMAL HIGH (ref 14.0–49.0)
MCH: 30.1 pg (ref 27.2–33.4)
MCHC: 34 g/dL (ref 32.0–36.0)
MCV: 88.3 fL (ref 79.3–98.0)
MONO#: 0.9 10*3/uL (ref 0.1–0.9)
MONO%: 5.2 % (ref 0.0–14.0)
NEUT#: 7.5 10*3/uL — ABNORMAL HIGH (ref 1.5–6.5)
NEUT%: 41.6 % (ref 39.0–75.0)
Platelets: 178 10*3/uL (ref 140–400)
RBC: 4.53 10*6/uL (ref 4.20–5.82)
RDW: 13.8 % (ref 11.0–14.6)
WBC: 18.1 10*3/uL — ABNORMAL HIGH (ref 4.0–10.3)
lymph#: 9.4 10*3/uL — ABNORMAL HIGH (ref 0.9–3.3)

## 2013-04-24 LAB — TSH CHCC: TSH: 2.016 m(IU)/L (ref 0.320–4.118)

## 2013-04-24 LAB — TECHNOLOGIST REVIEW

## 2013-04-24 NOTE — Telephone Encounter (Signed)
gv an dprinted avs for pt...sent pt back to lab.Derek KitchenMarland [name]

## 2013-04-30 DIAGNOSIS — R5381 Other malaise: Secondary | ICD-10-CM | POA: Diagnosis not present

## 2013-04-30 DIAGNOSIS — E119 Type 2 diabetes mellitus without complications: Secondary | ICD-10-CM | POA: Diagnosis not present

## 2013-04-30 DIAGNOSIS — I10 Essential (primary) hypertension: Secondary | ICD-10-CM | POA: Diagnosis not present

## 2013-04-30 DIAGNOSIS — R5383 Other fatigue: Secondary | ICD-10-CM | POA: Diagnosis not present

## 2013-05-10 NOTE — Progress Notes (Signed)
ID: Ardath Sax OB: [birth year]  MR#: [id number]  [place]  Telephone:(336) [phone] Fax:(336) [phone]    OFFICE PROGRESS NOTE   Referral MD: Wenda Low, M.D.  Reason for Referral: Lymphocyte-predominant lymphocytosis.   DIAGNOSIS: CHRONIC LYMPHOCYTIC LEUKEMIA.  PAST THERAPY: . None.  CURRENT THERAPY: Watchful observation.  HPI: Mr. Derek [name]. Derek [name] is a 72 year-old man with no significant PMH. He was noted to have leukocytosis on CBC on 01/14/13 with WBC 15.2; Hgb 14; plt 186. He was kindly referred to the [facility] for evaluation.  Mr. Paolicelli presented to the clinic for the first time on 03/11/2013 with a daughter-in-law where he was evaluated by Dr. [name] who recommended flow cytometry. Results of Peripheral Blood Flow Cytometry: IMMUNOPHENOTYPING BY FLOW CYTOMETRY OF THIS PERIPHERAL BLOOD SPECIMEN SHOWS A KAPPA RESTRICTED MONOCLONAL B CELL POPULATION WHICH IS POSITIVE FOR CD19, CD20, CD21, CD22, CD23 AND SHOWS CO-EXPRESSION OF CD5 AND 20. THE PERIPHERAL SMEAR REVIEW SHOWS LYMPHOCYTOSIS AND THE FINDINGS ARE CONSISTENT WITH CHRONIC LYMPHOCYTIC LEUKEMIA.  INTERVAL HISTORY: Mr. Derek [name]. Eschete is a 72 year-old man who presented for his follow up visit. He reported no changes since last visit. He continue to have low energy level. He has mild night sweats around neck. In morning he has nasal congestion. Occasional cough. Patient complains on tingling in both feet. He denied fever, anorexia, weight loss, headache, visual changes, confusion, drenching night sweats, palpable lymph node swelling, mucositis, odynophagia, dysphagia, nausea vomiting, jaundice, chest pain, palpitation, shortness of breath, dyspnea on exertion, productive cough, gum bleeding, epistaxis, hematemesis, hemoptysis, abdominal pain, abdominal swelling, early satiety, melena, hematochezia, hematuria, skin rash, spontaneous bleeding, joint swelling, joint pain, heat or cold intolerance, bowel bladder  incontinence, back pain, focal motor weakness, paresthesia, depression.   Review of Systems  Constitutional: Positive for malaise/fatigue. Negative for fever, chills, weight loss and diaphoresis.  HENT: Positive for congestion. Negative for hearing loss, nosebleeds, sore throat, neck pain, tinnitus and ear discharge.   Eyes: Negative for blurred vision, double vision and photophobia.  Respiratory: Positive for cough. Negative for hemoptysis, sputum production, shortness of breath and wheezing.   Cardiovascular: Negative for chest pain, palpitations, orthopnea, claudication, leg swelling and PND.  Gastrointestinal: Negative for heartburn, nausea, vomiting, abdominal pain, diarrhea, constipation and blood in stool.  Genitourinary: Negative for dysuria, urgency, frequency and hematuria.  Musculoskeletal: Negative for myalgias, back pain and joint pain.  Skin: Negative for itching and rash.  Neurological: Positive for tingling. Negative for dizziness, tremors, sensory change, speech change, focal weakness, seizures, loss of consciousness, weakness and headaches.  Endo/Heme/Allergies: Does not bruise/bleed easily.  Psychiatric/Behavioral: Negative.      PAST MEDICAL HISTORY: Past Medical History  Diagnosis Date  . Lymphocytosis   . Hypertension   . Diabetes mellitus, type II   . Obesity   . Hypertriglyceridemia     PAST SURGICAL HISTORY: Past Surgical History  Procedure Laterality Date  . Foot surgery Right   . Vericose vein stripping    . Nasal sinus surgery      FAMILY HISTORY Family History  Problem Relation Age of Onset  . Heart attack Mother   . Cancer Father     liver   HEALTH MAINTENANCE: History  Substance Use Topics  . Smoking status: Former Smoker -- 3.00 packs/day for 20 years    Quit date: 09/19/1988  . Smokeless tobacco: Never Used  . Alcohol Use: Yes    No Known Allergies  Current Outpatient Prescriptions  Medication Sig Dispense Refill  . atorvastatin  (LIPITOR) 40 MG tablet Take 40 mg by mouth daily.      . Cholecalciferol (VITAMIN D-3 PO) Take by mouth.      . fish oil-omega-3 fatty acids 1000 MG capsule Take 1 g by mouth daily.      . isosorbide mononitrate (IMDUR) 30 MG 24 hr tablet Take 1 tablet (30 mg total) by mouth daily.  30 tablet  12  . lisinopril-hydrochlorothiazide (PRINZIDE,ZESTORETIC) 10-12.5 MG per tablet Take 1 tablet by mouth daily.      . metFORMIN (GLUCOPHAGE) 500 MG tablet Take 500 mg by mouth. Take 3 tablets daily      . metoprolol succinate (TOPROL XL) 50 MG 24 hr tablet Take 1 tablet (50 mg total) by mouth daily. Take with or immediately following a meal.  30 tablet  12    OBJECTIVE: Filed Vitals:   04/24/13 0902  BP: 158/80  Pulse: 92  Temp: 98.4 F (36.9 C)  Resp: 20     Body mass index is 38.52 kg/(m^2).    ECOG FS: 0  HEENT: Sclerae anicteric.  Conjunctivae were pink. Pupils round and reactive bilaterally. Oral mucosa is moist without ulceration or thrush. No occipital, submandibular, cervical, supraclavicular or axillar adenopathy. Lungs: clear to auscultation without wheezes. No rales or rhonchi. Heart: regular rate and rhythm. No murmur, gallop or rubs. Abdomen: soft, non tender. No guarding or rebound tenderness. Bowel sounds are present. No palpable hepatosplenomegaly. MSK: no focal spinal tenderness. Extremities: No clubbing or cyanosis.No calf tenderness to palpitation, no peripheral edema. The patient had grossly intact strength in upper and lower extremities Neuro: non-focal, alert and oriented to time, person and place, appropriate affect  LAB RESULTS:  CMP     Component Value Date/Time   NA 137 03/11/2013 1031   K 4.2 03/11/2013 1031   CL 101 03/11/2013 1031   CO2 25 03/11/2013 1031   GLUCOSE 173* 03/11/2013 1031   GLUCOSE 128* 01/17/2013 1040   BUN 11.1 03/11/2013 1031   CREATININE 1.1 03/11/2013 1031   CALCIUM 9.7 03/11/2013 1031   PROT 7.2 03/11/2013 1031   ALBUMIN 3.6 03/11/2013 1031   AST  14 03/11/2013 1031   ALT 12 03/11/2013 1031   ALKPHOS 62 03/11/2013 1031   BILITOT 1.00 03/11/2013 1031    Lab Results  Component Value Date   WBC 18.1* 04/24/2013   NEUTROABS 7.5* 04/24/2013   HGB 13.6 04/24/2013   HCT 40.0 04/24/2013   MCV 88.3 04/24/2013   PLT 178 04/24/2013      Chemistry      Component Value Date/Time   NA 137 03/11/2013 1031   K 4.2 03/11/2013 1031   CL 101 03/11/2013 1031   CO2 25 03/11/2013 1031   BUN 11.1 03/11/2013 1031   CREATININE 1.1 03/11/2013 1031      Component Value Date/Time   CALCIUM 9.7 03/11/2013 1031   ALKPHOS 62 03/11/2013 1031   AST 14 03/11/2013 1031   ALT 12 03/11/2013 1031   BILITOT 1.00 03/11/2013 1031     ASSESSMENT AND PLAN:  1. CLL (chronic lymphocytic leukemia).   2. We discussed indications for treatment such as severe constitutional symptoms, low blood count, massive lymph nodes, and recurrent infection. Patient was offer treatment for CLL if he has significant fatigue, but he decided to be on watchful observation. 3. Diabetes mellitus, type II. His tiredness can be secondary to not well control diabetes mellitus. I recommended patient follow with his  PCP foe control his diabetes. 4. Fatigue. We will check thyroid function today. 5. Follow up: In about 4 months.    The length of time of the face-to-face encounter was 25 minutes. More than 50% of time was spent counseling and coordination of care.   Conni Slipper, MD   04/25/2013  3:41 AM

## 2013-05-28 ENCOUNTER — Other Ambulatory Visit: Payer: Self-pay | Admitting: *Deleted

## 2013-06-19 DIAGNOSIS — E782 Mixed hyperlipidemia: Secondary | ICD-10-CM | POA: Diagnosis not present

## 2013-06-19 DIAGNOSIS — I10 Essential (primary) hypertension: Secondary | ICD-10-CM | POA: Diagnosis not present

## 2013-06-19 DIAGNOSIS — R21 Rash and other nonspecific skin eruption: Secondary | ICD-10-CM | POA: Diagnosis not present

## 2013-06-19 DIAGNOSIS — D72829 Elevated white blood cell count, unspecified: Secondary | ICD-10-CM | POA: Diagnosis not present

## 2013-06-19 DIAGNOSIS — E119 Type 2 diabetes mellitus without complications: Secondary | ICD-10-CM | POA: Diagnosis not present

## 2013-07-11 DIAGNOSIS — H2510 Age-related nuclear cataract, unspecified eye: Secondary | ICD-10-CM | POA: Diagnosis not present

## 2013-07-11 DIAGNOSIS — E119 Type 2 diabetes mellitus without complications: Secondary | ICD-10-CM | POA: Diagnosis not present

## 2013-08-27 ENCOUNTER — Encounter: Payer: Self-pay | Admitting: Cardiology

## 2013-08-27 DIAGNOSIS — E119 Type 2 diabetes mellitus without complications: Secondary | ICD-10-CM | POA: Insufficient documentation

## 2013-08-27 DIAGNOSIS — E781 Pure hyperglyceridemia: Secondary | ICD-10-CM | POA: Insufficient documentation

## 2013-08-27 DIAGNOSIS — E669 Obesity, unspecified: Secondary | ICD-10-CM | POA: Insufficient documentation

## 2013-08-27 DIAGNOSIS — I10 Essential (primary) hypertension: Secondary | ICD-10-CM | POA: Insufficient documentation

## 2013-08-28 ENCOUNTER — Encounter: Payer: Self-pay | Admitting: Cardiology

## 2013-08-28 ENCOUNTER — Ambulatory Visit (INDEPENDENT_AMBULATORY_CARE_PROVIDER_SITE_OTHER): Payer: Medicare Other | Admitting: Cardiology

## 2013-08-28 ENCOUNTER — Encounter (INDEPENDENT_AMBULATORY_CARE_PROVIDER_SITE_OTHER): Payer: Self-pay

## 2013-08-28 VITALS — BP 124/80 | HR 70 | Ht 75.0 in | Wt 311.0 lb

## 2013-08-28 DIAGNOSIS — I10 Essential (primary) hypertension: Secondary | ICD-10-CM | POA: Diagnosis not present

## 2013-08-28 DIAGNOSIS — E669 Obesity, unspecified: Secondary | ICD-10-CM

## 2013-08-28 DIAGNOSIS — E119 Type 2 diabetes mellitus without complications: Secondary | ICD-10-CM

## 2013-08-28 DIAGNOSIS — R9430 Abnormal result of cardiovascular function study, unspecified: Secondary | ICD-10-CM

## 2013-08-28 DIAGNOSIS — I2510 Atherosclerotic heart disease of native coronary artery without angina pectoris: Secondary | ICD-10-CM | POA: Diagnosis not present

## 2013-08-28 NOTE — Progress Notes (Signed)
[facility] [address], [address], [place]  [address] Phone: [phone] Fax:  [phone]  Date:  08/28/2013   ID:  Rohaan, [place] December 26, 1940, MRN [id number]  PCP:  Wenda Low, MD   History of Present Illness: Derek [name] is a 72 y.o. male with newly discovered CAD. He be complaining of right shoulder and right upper chest discomfort with walking and occasional shortness of breath with no associated diaphoresis or nausea. He has a history of diabetes, hypertension and obesity. Dr. Lysle Rubens ordered a nuclear stress test. Stress test was performed and demonstrated evidence of mild ischemia in the basal inferolateral to mid inferolateral region. Felt to be a moderate risk myocardial perfusion study. He also had during exercise a small/brief run of possible ventricular tachycardia that was slow and was noted to have significant dyspnea on exertion.  Catheterization demonstrated mid LAD 85% stenosis immediately after bifurcation of a large diagonal branch. Distal to this lesion approximately 15 mm was another 95% stenosis, calcified. In the very distal regions of the LAD there was diffuse disease up to 80%. The large diagonal branch mentioned above also demonstrated 2 focal areas of 85% stenosis of the proximal/mid vessel. Other vessels demonstrated minor luminal irregularities. Ejection fraction 55%.   After cardiac catheterization I started him on metoprolol as well as isosorbide. He states that with medication, he has not been feeling any further symptoms. No exertional discomfort at this point. He will monitor this closely. We did discuss the risks of taking Viagra with isosorbide. For the first 3 days he had a headache with isosorbide, this is improved.  On 03/15/13-he is interested in perhaps coming off of the isosorbide because of the limitations with Viagra. I'm willing to allow him to trial being off of this medication. Of course if he develops anginal-like symptoms, I would like  for him to be back on isosorbide. We have discussed at length. He is going to [facility]  08/28/13 - Left angle swelling after driving from [place]. Stopped and walked around. Some pain in right arm when walking up stairs. His isosorbide remains on his drug list. He believes he is still taking this medication.  Wt Readings from Last 3 Encounters:  08/28/13 311 lb (141.069 kg)  04/24/13 316 lb 4.8 oz (143.473 kg)  03/11/13 318 lb 9.6 oz (144.516 kg)     Past Medical History  Diagnosis Date  . Lymphocytosis   . Hypertension   . Diabetes mellitus, type II   . Obesity   . Hypertriglyceridemia     Past Surgical History  Procedure Laterality Date  . Foot surgery Right   . Vericose vein stripping    . Nasal sinus surgery      Current Outpatient Prescriptions  Medication Sig Dispense Refill  . aspirin 81 MG tablet Take 81 mg by mouth daily.      Marland Kitchen atorvastatin (LIPITOR) 40 MG tablet Take 40 mg by mouth daily.      . Cholecalciferol (VITAMIN D-3 PO) Take by mouth.      . fish oil-omega-3 fatty acids 1000 MG capsule Take 2 g by mouth daily.       . isosorbide mononitrate (IMDUR) 30 MG 24 hr tablet Take 1 tablet (30 mg total) by mouth daily.  30 tablet  12  . lisinopril-hydrochlorothiazide (PRINZIDE,ZESTORETIC) 10-12.5 MG per tablet Take 1 tablet by mouth daily.      . metFORMIN (GLUCOPHAGE) 500 MG tablet Take 2,000 mg by  mouth daily.       . metoprolol succinate (TOPROL XL) 50 MG 24 hr tablet Take 1 tablet (50 mg total) by mouth daily. Take with or immediately following a meal.  30 tablet  12   No current facility-administered medications for this visit.    Allergies:   No Known Allergies  Social History:  The patient  reports that he quit smoking about 24 years ago. He has never used smokeless tobacco. He reports that he drinks alcohol.   ROS:  Please see the history of present illness.   No syncope, no bleeding, no orthopnea, no PND    PHYSICAL EXAM: VS:  BP 124/80  Pulse  70  Ht 6\' 3"  (1.905 m)  Wt 311 lb (141.069 kg)  BMI 38.87 kg/m2 Well nourished, well developed, in no acute distress HEENT: normal Neck: no JVD Cardiac:  normal S1, S2; RRR; no murmur Lungs:  clear to auscultation bilaterally, no wheezing, rhonchi or rales Abd: soft, nontender, no hepatomegalyObese Ext: no edema Skin: warm and dry Neuro: no focal abnormalities noted  EKG:  Normal rhythm, rate 70, poor R wave progression, left axis deviation, no other changes  Cardiac catheterization 01/17/13: Stress test moderate risk with inferolateral mid to apex ischemia. 1. Mid LAD disease immediately after the bifurcation of a large diagonal branch of 85% stenosis, then 15 mm distal to that, 95% calcified stenosis, then distal LAD diffuse disease with lesions of up to 80-90%. Nitroglycerin administered with mild increase in caliber of the distal vessel. There is also small caliber posterior lateral branch 90% proximal stenosis, collateral blood flow. There is small caliber obtuse marginal branch stenosis/occlusion as well with left to left collaterals filling retrograde. See above for details. 2. Normal left ventricular systolic function. LVEDP 6 mmHg. Ejection fraction 55%. No significant wall motion abnormalities. Have discussed this with Dr. Irish Lack.   ASSESSMENT AND PLAN:  1. Coronary artery disease-currently stable on antianginal medications. Aggressive secondary prevention. Disease as described above. Reviewed. 2. Angina-atypical right arm pain with motion. Could be anginal symptoms or perhaps musculoskeletal. We will continue with anti-anginal therapy. 3. Obesity-continue with aggressive diet, exercise. He has lost approximately 7 pounds since June. 4. Hyperlipidemia-continue with atorvastatin. LFTs normal in June. Hemoglobin 13.6. Prior LDL 36. 5. Diabetes-Dr. Deforest Hoyles working on this. Medications reviewed. ACE inhibitor for renal protection. Creatinine 1.1 in June 2014.  Signed, [name],  MD [facility]  08/28/2013 9:08 AM

## 2013-08-28 NOTE — Patient Instructions (Signed)
Your physician recommends that you continue on your current medications as directed. Please refer to the Current Medication list given to you today.  Your physician wants you to follow-up in: 6 months with Dr. Skains. You will receive a reminder letter in the mail two months in advance. If you don't receive a letter, please call our office to schedule the follow-up appointment.  

## 2013-09-10 ENCOUNTER — Other Ambulatory Visit (HOSPITAL_BASED_OUTPATIENT_CLINIC_OR_DEPARTMENT_OTHER): Payer: Medicare Other

## 2013-09-10 ENCOUNTER — Encounter: Payer: Self-pay | Admitting: Hematology and Oncology

## 2013-09-10 ENCOUNTER — Ambulatory Visit (HOSPITAL_BASED_OUTPATIENT_CLINIC_OR_DEPARTMENT_OTHER): Payer: Medicare Other | Admitting: Hematology and Oncology

## 2013-09-10 ENCOUNTER — Telehealth: Payer: Self-pay | Admitting: Hematology and Oncology

## 2013-09-10 VITALS — BP 150/63 | HR 64 | Temp 98.3°F | Resp 18 | Ht 75.0 in | Wt 312.5 lb

## 2013-09-10 DIAGNOSIS — C9110 Chronic lymphocytic leukemia of B-cell type not having achieved remission: Secondary | ICD-10-CM

## 2013-09-10 DIAGNOSIS — D72820 Lymphocytosis (symptomatic): Secondary | ICD-10-CM

## 2013-09-10 DIAGNOSIS — D72819 Decreased white blood cell count, unspecified: Secondary | ICD-10-CM

## 2013-09-10 DIAGNOSIS — C8330 Diffuse large B-cell lymphoma, unspecified site: Secondary | ICD-10-CM | POA: Insufficient documentation

## 2013-09-10 DIAGNOSIS — Z85820 Personal history of malignant melanoma of skin: Secondary | ICD-10-CM | POA: Diagnosis not present

## 2013-09-10 DIAGNOSIS — D649 Anemia, unspecified: Secondary | ICD-10-CM

## 2013-09-10 HISTORY — DX: Chronic lymphocytic leukemia of B-cell type not having achieved remission: C91.10

## 2013-09-10 LAB — TECHNOLOGIST REVIEW

## 2013-09-10 LAB — COMPREHENSIVE METABOLIC PANEL (CC13)
ALT: 14 U/L (ref 0–55)
AST: 16 U/L (ref 5–34)
Albumin: 3.6 g/dL (ref 3.5–5.0)
Alkaline Phosphatase: 69 U/L (ref 40–150)
Anion Gap: 12 mEq/L — ABNORMAL HIGH (ref 3–11)
BUN: 15.5 mg/dL (ref 7.0–26.0)
CO2: 24 mEq/L (ref 22–29)
Calcium: 9.6 mg/dL (ref 8.4–10.4)
Chloride: 104 mEq/L (ref 98–109)
Creatinine: 1 mg/dL (ref 0.7–1.3)
Glucose: 131 mg/dl (ref 70–140)
Potassium: 4.2 mEq/L (ref 3.5–5.1)
Sodium: 140 mEq/L (ref 136–145)
Total Bilirubin: 0.85 mg/dL (ref 0.20–1.20)
Total Protein: 7.5 g/dL (ref 6.4–8.3)

## 2013-09-10 LAB — CBC WITH DIFFERENTIAL/PLATELET
BASO%: 0.7 % (ref 0.0–2.0)
Basophils Absolute: 0.2 10*3/uL — ABNORMAL HIGH (ref 0.0–0.1)
EOS%: 1.4 % (ref 0.0–7.0)
Eosinophils Absolute: 0.3 10*3/uL (ref 0.0–0.5)
HCT: 39 % (ref 38.4–49.9)
HGB: 12.9 g/dL — ABNORMAL LOW (ref 13.0–17.1)
LYMPH%: 69.9 % — ABNORMAL HIGH (ref 14.0–49.0)
MCH: 28.8 pg (ref 27.2–33.4)
MCHC: 33.1 g/dL (ref 32.0–36.0)
MCV: 87 fL (ref 79.3–98.0)
MONO#: 1 10*3/uL — ABNORMAL HIGH (ref 0.1–0.9)
MONO%: 4 % (ref 0.0–14.0)
NEUT#: 6.2 10*3/uL (ref 1.5–6.5)
NEUT%: 24 % — ABNORMAL LOW (ref 39.0–75.0)
Platelets: 175 10*3/uL (ref 140–400)
RBC: 4.48 10*6/uL (ref 4.20–5.82)
RDW: 14.7 % — ABNORMAL HIGH (ref 11.0–14.6)
WBC: 25.8 10*3/uL — ABNORMAL HIGH (ref 4.0–10.3)
lymph#: 18 10*3/uL — ABNORMAL HIGH (ref 0.9–3.3)

## 2013-09-10 LAB — CHCC SMEAR

## 2013-09-10 NOTE — Telephone Encounter (Signed)
appts made per 12/23 POF AVS and CAL given shh

## 2013-09-10 NOTE — Progress Notes (Signed)
[name] OFFICE PROGRESS NOTE  Patient Care Team: Wenda Low, MD as PCP - General (Internal Medicine) [name], MD as Attending Physician (Cardiology)  DIAGNOSIS: CLL, Rai stage 0  SUMMARY OF ONCOLOGIC HISTORY: This is a pleasant 72 year old gentleman was found to have lymphocytosis. Flow cytometry confirmed CLL. He was placed on observation.  INTERVAL HISTORY: Derek [name] 72 y.o. male returns for further followup. The patient denies any recent infection. He denies any new skin lesion.\ Denies any new lymphadenopathy.  I have reviewed the past medical history, past surgical history, social history and family history with the patient and they are unchanged from previous note.  ALLERGIES:  has No Known Allergies.  MEDICATIONS:  Current Outpatient Prescriptions  Medication Sig Dispense Refill  . aspirin 81 MG tablet Take 81 mg by mouth daily. Every other day      . atorvastatin (LIPITOR) 40 MG tablet Take 40 mg by mouth daily.      . fish oil-omega-3 fatty acids 1000 MG capsule Take 2 g by mouth daily.       . isosorbide mononitrate (IMDUR) 30 MG 24 hr tablet Take 1 tablet (30 mg total) by mouth daily.  30 tablet  12  . lisinopril-hydrochlorothiazide (PRINZIDE,ZESTORETIC) 10-12.5 MG per tablet Take 1 tablet by mouth daily.      . metFORMIN (GLUCOPHAGE) 500 MG tablet Take 1,000 mg by mouth 2 (two) times daily with a meal.       . metoprolol succinate (TOPROL-XL) 25 MG 24 hr tablet Take 25 mg by mouth daily.       No current facility-administered medications for this visit.    REVIEW OF SYSTEMS:   Constitutional: Denies fevers, chills or abnormal weight loss Eyes: Denies blurriness of vision Ears, nose, mouth, throat, and face: Denies mucositis or sore throat Respiratory: Denies cough, dyspnea or wheezes Cardiovascular: Denies palpitation, chest discomfort or lower extremity swelling Gastrointestinal:  Denies nausea, heartburn or change in bowel  habits Skin: Denies abnormal skin rashes Lymphatics: Denies new lymphadenopathy or easy bruising Neurological:Denies numbness, tingling or new weaknesses Behavioral/Psych: Mood is stable, no new changes  All other systems were reviewed with the patient and are negative.  PHYSICAL EXAMINATION: ECOG PERFORMANCE STATUS: 0 - Asymptomatic  Filed Vitals:   09/10/13 0827  BP: 150/63  Pulse: 64  Temp: 98.3 F (36.8 C)  Resp: 18   Filed Weights   09/10/13 0827  Weight: 312 lb 8 oz (141.749 kg)    GENERAL:alert, no distress and comfortable. The patient is morbidly obese SKIN: skin color, texture, turgor are normal, no rashes or significant lesions EYES: normal, Conjunctiva are pink and non-injected, sclera clear OROPHARYNX:no exudate, no erythema and lips, buccal mucosa, and tongue normal  NECK: supple, thyroid normal size, non-tender, without nodularity LYMPH:  no palpable lymphadenopathy in the cervical, axillary or inguinal LUNGS: clear to auscultation and percussion with normal breathing effort HEART: regular rate & rhythm and no murmurs and no lower extremity edema ABDOMEN:abdomen soft, non-tender and normal bowel sounds. Unable to appreciate splenomegaly due to morbid obesity Musculoskeletal:no cyanosis of digits and no clubbing  NEURO: alert & oriented x 3 with fluent speech, no focal motor/sensory deficits  LABORATORY DATA:  I have reviewed the data as listed    Component Value Date/Time   NA 140 09/10/2013 0812   K 4.2 09/10/2013 0812   CL 101 03/11/2013 1031   CO2 24 09/10/2013 0812   GLUCOSE 131 09/10/2013 0812   GLUCOSE 173* 03/11/2013  1031   GLUCOSE 128* 01/17/2013 1040   BUN 15.5 09/10/2013 0812   CREATININE 1.0 09/10/2013 0812   CALCIUM 9.6 09/10/2013 0812   PROT 7.5 09/10/2013 0812   ALBUMIN 3.6 09/10/2013 0812   AST 16 09/10/2013 0812   ALT 14 09/10/2013 0812   ALKPHOS 69 09/10/2013 0812   BILITOT 0.85 09/10/2013 0812    No results found for this basename:  SPEP, UPEP,  kappa and lambda light chains    Lab Results  Component Value Date   WBC 25.8* 09/10/2013   NEUTROABS 6.2 09/10/2013   HGB 12.9* 09/10/2013   HCT 39.0 09/10/2013   MCV 87.0 09/10/2013   PLT 175 09/10/2013      Chemistry      Component Value Date/Time   NA 140 09/10/2013 0812   K 4.2 09/10/2013 0812   CL 101 03/11/2013 1031   CO2 24 09/10/2013 0812   BUN 15.5 09/10/2013 0812   CREATININE 1.0 09/10/2013 0812      Component Value Date/Time   CALCIUM 9.6 09/10/2013 0812   ALKPHOS 69 09/10/2013 0812   AST 16 09/10/2013 0812   ALT 14 09/10/2013 0812   BILITOT 0.85 09/10/2013 0812      ASSESSMENT & PLAN:  #1 CLL The patient have rapidly increasing white blood cell count. I did not see a FISH panel done. I will order that with his next visit. I educated the patient's signs and symptoms to watch out for progression of CLL. In the meantime I will like to see him back in 4 months for history, blood work and physical examination. #2 very mild anemia This is new. It could be due to progression of CLL or anemia chronic disease. He is not symptomatic. We will observe for now. #3 history of skin cancer I recommend vigilance skin care and yearly dermatology visit. I recommend him to take vitamin D supplements. #4 preventive care He has received influenza vaccination this year.  Orders Placed This Encounter  Procedures  . CBC & Diff and Retic    Standing Status: Future     Number of Occurrences:      Standing Expiration Date: 09/10/2014  . Comprehensive metabolic panel    Standing Status: Future     Number of Occurrences:      Standing Expiration Date: 09/10/2014  . FISH, Peripheral Blood    CLL FISH    Standing Status: Future     Number of Occurrences:      Standing Expiration Date: 09/10/2014  . Lactate dehydrogenase    Standing Status: Future     Number of Occurrences:      Standing Expiration Date: 09/10/2014  . IgG, IgA, IgM    Standing Status: Future      Number of Occurrences:      Standing Expiration Date: 09/10/2014  . Direct antiglobulin test    Standing Status: Future     Number of Occurrences:      Standing Expiration Date: 09/10/2014   All questions were answered. The patient knows to call the clinic with any problems, questions or concerns. No barriers to learning was detected. I spent 25 minutes counseling the patient face to face. The total time spent in the appointment was 40 minutes and more than 50% was on counseling and review of test results     [facility], [place], MD 09/10/2013 8:54 AM

## 2013-09-18 ENCOUNTER — Other Ambulatory Visit (HOSPITAL_COMMUNITY): Payer: Self-pay | Admitting: Internal Medicine

## 2013-09-18 ENCOUNTER — Ambulatory Visit (HOSPITAL_COMMUNITY)
Admission: RE | Admit: 2013-09-18 | Discharge: 2013-09-18 | Disposition: A | Discharge status: Home / Self Care | Payer: Medicare Other | Source: Ambulatory Visit | Attending: Internal Medicine | Admitting: Internal Medicine

## 2013-09-18 DIAGNOSIS — L03119 Cellulitis of unspecified part of limb: Secondary | ICD-10-CM

## 2013-09-18 DIAGNOSIS — M25561 Pain in right knee: Secondary | ICD-10-CM

## 2013-09-18 DIAGNOSIS — I82819 Embolism and thrombosis of superficial veins of unspecified lower extremities: Secondary | ICD-10-CM | POA: Insufficient documentation

## 2013-09-18 DIAGNOSIS — M79609 Pain in unspecified limb: Secondary | ICD-10-CM | POA: Diagnosis not present

## 2013-09-18 DIAGNOSIS — M7989 Other specified soft tissue disorders: Secondary | ICD-10-CM

## 2013-09-18 DIAGNOSIS — I889 Nonspecific lymphadenitis, unspecified: Secondary | ICD-10-CM | POA: Diagnosis not present

## 2013-09-18 DIAGNOSIS — I809 Phlebitis and thrombophlebitis of unspecified site: Secondary | ICD-10-CM | POA: Diagnosis not present

## 2013-09-18 DIAGNOSIS — L02419 Cutaneous abscess of limb, unspecified: Secondary | ICD-10-CM

## 2013-09-18 DIAGNOSIS — I83893 Varicose veins of bilateral lower extremities with other complications: Secondary | ICD-10-CM | POA: Insufficient documentation

## 2013-09-18 DIAGNOSIS — L539 Erythematous condition, unspecified: Secondary | ICD-10-CM | POA: Diagnosis not present

## 2013-09-18 DIAGNOSIS — R609 Edema, unspecified: Secondary | ICD-10-CM | POA: Diagnosis not present

## 2013-09-18 NOTE — Progress Notes (Signed)
VASCULAR LAB PRELIMINARY  PRELIMINARY  PRELIMINARY  PRELIMINARY  Right lower extremity venous duplex completed.    Preliminary report:  No evidence of right lower extremity DVT or Baker's cyst. 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

## 2013-09-30 DIAGNOSIS — L02419 Cutaneous abscess of limb, unspecified: Secondary | ICD-10-CM | POA: Diagnosis not present

## 2013-09-30 DIAGNOSIS — L03119 Cellulitis of unspecified part of limb: Secondary | ICD-10-CM | POA: Diagnosis not present

## 2013-10-01 ENCOUNTER — Telehealth: Payer: Self-pay | Admitting: *Deleted

## 2013-10-01 NOTE — Telephone Encounter (Signed)
Call from nurse Berneice Gandy at Dr. Sherilyn Cooter office requesting last office note and list of medications prescribed by Dr. Alvy Bimler.   Informed nurse no meds prescribed by Dr. Alvy Bimler and faxed over last note from Dec 2014 to their fax [phone] as requested.

## 2013-11-15 ENCOUNTER — Other Ambulatory Visit: Payer: Self-pay | Admitting: Internal Medicine

## 2013-11-15 ENCOUNTER — Ambulatory Visit
Admission: RE | Admit: 2013-11-15 | Discharge: 2013-11-15 | Disposition: A | Discharge status: Home / Self Care | Payer: Medicare Other | Source: Ambulatory Visit | Attending: Internal Medicine | Admitting: Internal Medicine

## 2013-11-15 DIAGNOSIS — R195 Other fecal abnormalities: Secondary | ICD-10-CM | POA: Diagnosis not present

## 2013-11-15 DIAGNOSIS — R059 Cough, unspecified: Secondary | ICD-10-CM | POA: Diagnosis not present

## 2013-11-15 DIAGNOSIS — R05 Cough: Secondary | ICD-10-CM | POA: Diagnosis not present

## 2013-11-15 DIAGNOSIS — R5383 Other fatigue: Secondary | ICD-10-CM | POA: Diagnosis not present

## 2013-11-15 DIAGNOSIS — N289 Disorder of kidney and ureter, unspecified: Secondary | ICD-10-CM | POA: Diagnosis not present

## 2013-11-15 DIAGNOSIS — R109 Unspecified abdominal pain: Secondary | ICD-10-CM | POA: Diagnosis not present

## 2013-11-15 DIAGNOSIS — D72829 Elevated white blood cell count, unspecified: Secondary | ICD-10-CM | POA: Diagnosis not present

## 2013-11-15 DIAGNOSIS — R5381 Other malaise: Secondary | ICD-10-CM | POA: Diagnosis not present

## 2013-11-18 DIAGNOSIS — D72829 Elevated white blood cell count, unspecified: Secondary | ICD-10-CM | POA: Diagnosis not present

## 2013-11-19 DIAGNOSIS — Z1211 Encounter for screening for malignant neoplasm of colon: Secondary | ICD-10-CM | POA: Diagnosis not present

## 2013-11-20 DIAGNOSIS — R109 Unspecified abdominal pain: Secondary | ICD-10-CM | POA: Diagnosis not present

## 2013-11-20 DIAGNOSIS — Z792 Long term (current) use of antibiotics: Secondary | ICD-10-CM | POA: Diagnosis not present

## 2013-11-25 ENCOUNTER — Other Ambulatory Visit: Payer: Self-pay | Admitting: Oncology

## 2013-12-26 ENCOUNTER — Ambulatory Visit (HOSPITAL_BASED_OUTPATIENT_CLINIC_OR_DEPARTMENT_OTHER): Payer: Medicare Other | Admitting: Hematology and Oncology

## 2013-12-26 ENCOUNTER — Other Ambulatory Visit (HOSPITAL_BASED_OUTPATIENT_CLINIC_OR_DEPARTMENT_OTHER): Payer: Medicare Other

## 2013-12-26 ENCOUNTER — Other Ambulatory Visit (HOSPITAL_COMMUNITY)
Admission: RE | Admit: 2013-12-26 | Discharge: 2013-12-26 | Disposition: A | Discharge status: Home / Self Care | Payer: Medicare Other | Source: Ambulatory Visit | Attending: Hematology and Oncology | Admitting: Hematology and Oncology

## 2013-12-26 ENCOUNTER — Encounter: Payer: Self-pay | Admitting: Hematology and Oncology

## 2013-12-26 ENCOUNTER — Telehealth: Payer: Self-pay | Admitting: Hematology and Oncology

## 2013-12-26 VITALS — BP 135/63 | HR 61 | Temp 98.1°F | Resp 20 | Ht 75.0 in | Wt 300.1 lb

## 2013-12-26 DIAGNOSIS — R634 Abnormal weight loss: Secondary | ICD-10-CM

## 2013-12-26 DIAGNOSIS — D649 Anemia, unspecified: Secondary | ICD-10-CM

## 2013-12-26 DIAGNOSIS — D72820 Lymphocytosis (symptomatic): Secondary | ICD-10-CM | POA: Diagnosis not present

## 2013-12-26 DIAGNOSIS — C9110 Chronic lymphocytic leukemia of B-cell type not having achieved remission: Secondary | ICD-10-CM | POA: Insufficient documentation

## 2013-12-26 LAB — COMPREHENSIVE METABOLIC PANEL (CC13)
ALT: <6 U/L (ref 0–55)
AST: 12 U/L (ref 5–34)
Albumin: 3.5 g/dL (ref 3.5–5.0)
Alkaline Phosphatase: 63 U/L (ref 40–150)
Anion Gap: 12 mEq/L — ABNORMAL HIGH (ref 3–11)
BUN: 17.5 mg/dL (ref 7.0–26.0)
CO2: 24 mEq/L (ref 22–29)
Calcium: 9.6 mg/dL (ref 8.4–10.4)
Chloride: 105 mEq/L (ref 98–109)
Creatinine: 1 mg/dL (ref 0.7–1.3)
Glucose: 139 mg/dl (ref 70–140)
Potassium: 4.3 mEq/L (ref 3.5–5.1)
Sodium: 140 mEq/L (ref 136–145)
Total Bilirubin: 0.95 mg/dL (ref 0.20–1.20)
Total Protein: 7.3 g/dL (ref 6.4–8.3)

## 2013-12-26 LAB — CBC & DIFF AND RETIC
BASO%: 0.6 % (ref 0.0–2.0)
Basophils Absolute: 0.2 10*3/uL — ABNORMAL HIGH (ref 0.0–0.1)
EOS%: 0.7 % (ref 0.0–7.0)
Eosinophils Absolute: 0.2 10*3/uL (ref 0.0–0.5)
HCT: 35.1 % — ABNORMAL LOW (ref 38.4–49.9)
HGB: 11.3 g/dL — ABNORMAL LOW (ref 13.0–17.1)
Immature Retic Fract: 11.1 % — ABNORMAL HIGH (ref 3.00–10.60)
LYMPH%: 78.1 % — ABNORMAL HIGH (ref 14.0–49.0)
MCH: 27.5 pg (ref 27.2–33.4)
MCHC: 32.3 g/dL (ref 32.0–36.0)
MCV: 85.1 fL (ref 79.3–98.0)
MONO#: 1 10*3/uL — ABNORMAL HIGH (ref 0.1–0.9)
MONO%: 3.6 % (ref 0.0–14.0)
NEUT#: 4.7 10*3/uL (ref 1.5–6.5)
NEUT%: 17 % — ABNORMAL LOW (ref 39.0–75.0)
Platelets: 175 10*3/uL (ref 140–400)
RBC: 4.12 10*6/uL — ABNORMAL LOW (ref 4.20–5.82)
RDW: 17 % — ABNORMAL HIGH (ref 11.0–14.6)
Retic %: 1.83 % — ABNORMAL HIGH (ref 0.80–1.80)
Retic Ct Abs: 75.4 10*3/uL (ref 34.80–93.90)
WBC: 27.9 10*3/uL — ABNORMAL HIGH (ref 4.0–10.3)
lymph#: 21.8 10*3/uL — ABNORMAL HIGH (ref 0.9–3.3)

## 2013-12-26 LAB — TECHNOLOGIST REVIEW

## 2013-12-26 LAB — LACTATE DEHYDROGENASE (CC13): LDH: 114 U/L — ABNORMAL LOW (ref 125–245)

## 2013-12-26 NOTE — Progress Notes (Signed)
**Derek [name] De-Identified via Obfuscation** Derek Derek [name]  Patient Care Team: Wenda Low, MD as PCP - General (Internal Medicine) [name], MD as Attending Physician (Cardiology)  DIAGNOSIS: CLL with progressive anemia and recent weight loss  SUMMARY OF ONCOLOGIC HISTORY: This is a pleasant 73 year old gentleman was found to have lymphocytosis. Flow cytometry confirmed CLL. He was placed on observation.  INTERVAL HISTORY: Derek Derek [name] 73 y.o. male returns for further followup. He has lost 12 pounds of weight since I saw him 4 months ago. He complained of fatigue. A month ago, he had some sort of infectious process with severe lower abdominal pain, resolved with antibiotic therapy. He denies any recent diarrhea.  I have reviewed the past medical history, past surgical history, social history and family history with the patient and they are unchanged from previous Derek [name].  ALLERGIES:  has No Known Allergies.  MEDICATIONS:  Current Outpatient Prescriptions  Medication Sig Dispense Refill  . aspirin 81 MG tablet Take 81 mg by mouth daily. Every other day      . atorvastatin (LIPITOR) 40 MG tablet Take 40 mg by mouth daily.      . fish oil-omega-3 fatty acids 1000 MG capsule Take 2 g by mouth daily.       . isosorbide mononitrate (IMDUR) 30 MG 24 hr tablet Take 1 tablet (30 mg total) by mouth daily.  30 tablet  12  . lisinopril-hydrochlorothiazide (PRINZIDE,ZESTORETIC) 10-12.5 MG per tablet Take 1 tablet by mouth daily.      . metFORMIN (GLUCOPHAGE) 500 MG tablet Take 1,000 mg by mouth 2 (two) times daily with a meal.       . metoprolol succinate (TOPROL-XL) 25 MG 24 hr tablet Take 25 mg by mouth daily.       No current facility-administered medications for this visit.    REVIEW OF SYSTEMS:   Constitutional: Denies fevers, chills  Eyes: Denies blurriness of vision Ears, nose, mouth, throat, and face: Denies mucositis or sore throat Respiratory: Denies cough, dyspnea or  wheezes Cardiovascular: Denies palpitation, chest discomfort or lower extremity swelling Gastrointestinal:  Denies nausea, heartburn or change in bowel habits Skin: Denies abnormal skin rashes Lymphatics: Denies new lymphadenopathy or easy bruising Neurological:Denies numbness, tingling or new weaknesses Behavioral/Psych: Mood is stable, no new changes  All other systems were reviewed with the patient and are negative.  PHYSICAL EXAMINATION: ECOG PERFORMANCE STATUS: 1 - Symptomatic but completely ambulatory  Filed Vitals:   12/26/13 0831  BP: 135/63  Pulse: 61  Temp: 98.1 F (36.7 C)  Resp: 20   Filed Weights   12/26/13 0831  Weight: 300 lb 1.6 oz (136.124 kg)    GENERAL:alert, no distress and comfortable. He is morbidly obese SKIN: skin color, texture, turgor are normal, no rashes or significant lesions EYES: normal, Conjunctiva are pink and non-injected, sclera clear OROPHARYNX:no exudate, no erythema and lips, buccal mucosa, and tongue normal  NECK: supple, thyroid normal size, non-tender, without nodularity LYMPH:  no palpable lymphadenopathy in the cervical, axillary or inguinal LUNGS: clear to auscultation and percussion with normal breathing effort HEART: regular rate & rhythm and no murmurs and no lower extremity edema ABDOMEN:abdomen soft, non-tender and normal bowel sounds Musculoskeletal:no cyanosis of digits and no clubbing  NEURO: alert & oriented x 3 with fluent speech, no focal motor/sensory deficits  LABORATORY DATA:  I have reviewed the data as listed    Component Value Date/Time   NA 140 12/26/2013 0806   K 4.3 12/26/2013 0806  CL 101 03/11/2013 1031   CO2 24 12/26/2013 0806   GLUCOSE 139 12/26/2013 0806   GLUCOSE 173* 03/11/2013 1031   GLUCOSE 128* 01/17/2013 1040   BUN 17.5 12/26/2013 0806   CREATININE 1.0 12/26/2013 0806   CALCIUM 9.6 12/26/2013 0806   PROT 7.3 12/26/2013 0806   ALBUMIN 3.5 12/26/2013 0806   AST 12 12/26/2013 0806   ALT <6 12/26/2013 0806   ALKPHOS  63 12/26/2013 0806   BILITOT 0.95 12/26/2013 0806    No results found for this basename: SPEP,  UPEP,   kappa and lambda light chains    Lab Results  Component Value Date   WBC 27.9* 12/26/2013   NEUTROABS 4.7 12/26/2013   HGB 11.3* 12/26/2013   HCT 35.1* 12/26/2013   MCV 85.1 12/26/2013   PLT 175 12/26/2013      Chemistry      Component Value Date/Time   NA 140 12/26/2013 0806   K 4.3 12/26/2013 0806   CL 101 03/11/2013 1031   CO2 24 12/26/2013 0806   BUN 17.5 12/26/2013 0806   CREATININE 1.0 12/26/2013 0806      Component Value Date/Time   CALCIUM 9.6 12/26/2013 0806   ALKPHOS 63 12/26/2013 0806   AST 12 12/26/2013 0806   ALT <6 12/26/2013 0806   BILITOT 0.95 12/26/2013 0806     ASSESSMENT & PLAN:  #1 CLL with progressive lymphocytosis, weight loss and new onset of anemia  Overall, I think he has progression of his CLL. However, clinically, there is no reason to treat him yet. I recommend close followup and see him back in 3 months. If he starts to have evidence of impairment of quality of life, recurrence of infection, progressive weight loss, anemia or thrombocytopenia, he may be looking at treatment soon. I discussed with the patient the risks and benefits of watchful observation and he agreed.  Orders Placed This Encounter  Procedures  . CBC & Diff and Retic    Standing Status: Future     Number of Occurrences:      Standing Expiration Date: 12/26/2014  . Lactate dehydrogenase    Standing Status: Future     Number of Occurrences:      Standing Expiration Date: 12/26/2014  . Comprehensive metabolic panel    Standing Status: Future     Number of Occurrences:      Standing Expiration Date: 12/26/2014  . Ferritin    Standing Status: Future     Number of Occurrences:      Standing Expiration Date: 12/26/2014  . Iron and TIBC    Standing Status: Future     Number of Occurrences:      Standing Expiration Date: 12/26/2014   All questions were answered. The patient knows to call the clinic with any  problems, questions or concerns. No barriers to learning was detected. I spent 25 minutes counseling the patient face to face. The total time spent in the appointment was 30 minutes and more than 50% was on counseling and review of test results     [name], MD 12/26/2013 12:09 PM

## 2013-12-26 NOTE — Telephone Encounter (Signed)
s.w. pt wife and advised on July appt...mailed pt appt sched/avs and letter

## 2013-12-27 LAB — DIRECT ANTIGLOBULIN TEST (NOT AT ARMC)
DAT (Complement): NEGATIVE
DAT IgG: NEGATIVE

## 2013-12-27 LAB — IGG, IGA, IGM
IgA: 126 mg/dL (ref 68–379)
IgG (Immunoglobin G), Serum: 937 mg/dL (ref 650–1600)
IgM, Serum: 495 mg/dL — ABNORMAL HIGH (ref 41–251)

## 2013-12-30 DIAGNOSIS — E119 Type 2 diabetes mellitus without complications: Secondary | ICD-10-CM | POA: Diagnosis not present

## 2013-12-30 DIAGNOSIS — C9110 Chronic lymphocytic leukemia of B-cell type not having achieved remission: Secondary | ICD-10-CM | POA: Diagnosis not present

## 2013-12-30 DIAGNOSIS — I2510 Atherosclerotic heart disease of native coronary artery without angina pectoris: Secondary | ICD-10-CM | POA: Diagnosis not present

## 2013-12-30 DIAGNOSIS — Z1331 Encounter for screening for depression: Secondary | ICD-10-CM | POA: Diagnosis not present

## 2013-12-30 DIAGNOSIS — I10 Essential (primary) hypertension: Secondary | ICD-10-CM | POA: Diagnosis not present

## 2013-12-30 DIAGNOSIS — E782 Mixed hyperlipidemia: Secondary | ICD-10-CM | POA: Diagnosis not present

## 2013-12-30 DIAGNOSIS — Z0000 Encounter for general adult medical examination without abnormal findings: Secondary | ICD-10-CM | POA: Diagnosis not present

## 2014-01-06 LAB — TISSUE HYBRIDIZATION TO NCBH

## 2014-01-28 ENCOUNTER — Encounter: Payer: Self-pay | Admitting: Hematology and Oncology

## 2014-01-28 LAB — FISH, PERIPHERAL BLOOD

## 2014-02-18 ENCOUNTER — Other Ambulatory Visit: Payer: Self-pay | Admitting: *Deleted

## 2014-02-18 DIAGNOSIS — Z09 Encounter for follow-up examination after completed treatment for conditions other than malignant neoplasm: Secondary | ICD-10-CM | POA: Diagnosis not present

## 2014-02-18 DIAGNOSIS — Z86010 Personal history of colonic polyps: Secondary | ICD-10-CM | POA: Diagnosis not present

## 2014-02-18 DIAGNOSIS — K5730 Diverticulosis of large intestine without perforation or abscess without bleeding: Secondary | ICD-10-CM | POA: Diagnosis not present

## 2014-02-18 DIAGNOSIS — E7800 Pure hypercholesterolemia, unspecified: Secondary | ICD-10-CM

## 2014-02-18 DIAGNOSIS — Z79899 Other long term (current) drug therapy: Secondary | ICD-10-CM

## 2014-02-27 ENCOUNTER — Telehealth: Payer: Self-pay | Admitting: Hematology and Oncology

## 2014-02-27 NOTE — Telephone Encounter (Signed)
s.w. pt adn advised on appt change due to 7.3 holiday.Marland KitchenMarland KitchenMarland Kitchenp tok and aware

## 2014-03-14 ENCOUNTER — Ambulatory Visit (HOSPITAL_BASED_OUTPATIENT_CLINIC_OR_DEPARTMENT_OTHER): Payer: Medicare Other | Admitting: Hematology and Oncology

## 2014-03-14 ENCOUNTER — Other Ambulatory Visit: Payer: Medicare Other

## 2014-03-14 ENCOUNTER — Telehealth: Payer: Self-pay | Admitting: Hematology and Oncology

## 2014-03-14 ENCOUNTER — Encounter: Payer: Self-pay | Admitting: Hematology and Oncology

## 2014-03-14 VITALS — BP 128/70 | HR 51 | Temp 97.5°F | Resp 15 | Ht 75.0 in | Wt 293.9 lb

## 2014-03-14 DIAGNOSIS — R634 Abnormal weight loss: Secondary | ICD-10-CM | POA: Diagnosis not present

## 2014-03-14 DIAGNOSIS — C9110 Chronic lymphocytic leukemia of B-cell type not having achieved remission: Secondary | ICD-10-CM

## 2014-03-14 DIAGNOSIS — D630 Anemia in neoplastic disease: Secondary | ICD-10-CM

## 2014-03-14 DIAGNOSIS — D638 Anemia in other chronic diseases classified elsewhere: Secondary | ICD-10-CM | POA: Insufficient documentation

## 2014-03-14 LAB — COMPREHENSIVE METABOLIC PANEL (CC13)
ALT: 7 U/L (ref 0–55)
AST: 9 U/L (ref 5–34)
Albumin: 3.5 g/dL (ref 3.5–5.0)
Alkaline Phosphatase: 63 U/L (ref 40–150)
Anion Gap: 10 mEq/L (ref 3–11)
BUN: 18.5 mg/dL (ref 7.0–26.0)
CO2: 22 mEq/L (ref 22–29)
Calcium: 9.7 mg/dL (ref 8.4–10.4)
Chloride: 105 mEq/L (ref 98–109)
Creatinine: 1 mg/dL (ref 0.7–1.3)
Glucose: 150 mg/dl — ABNORMAL HIGH (ref 70–140)
Potassium: 4.6 mEq/L (ref 3.5–5.1)
Sodium: 137 mEq/L (ref 136–145)
Total Bilirubin: 0.48 mg/dL (ref 0.20–1.20)
Total Protein: 7.6 g/dL (ref 6.4–8.3)

## 2014-03-14 LAB — CBC & DIFF AND RETIC
BASO%: 0.3 % (ref 0.0–2.0)
Basophils Absolute: 0.1 10*3/uL (ref 0.0–0.1)
EOS%: 0.5 % (ref 0.0–7.0)
Eosinophils Absolute: 0.2 10*3/uL (ref 0.0–0.5)
HCT: 35.9 % — ABNORMAL LOW (ref 38.4–49.9)
HGB: 11.2 g/dL — ABNORMAL LOW (ref 13.0–17.1)
Immature Retic Fract: 6 % (ref 3.00–10.60)
LYMPH%: 82.9 % — ABNORMAL HIGH (ref 14.0–49.0)
MCH: 26.5 pg — ABNORMAL LOW (ref 27.2–33.4)
MCHC: 31.2 g/dL — ABNORMAL LOW (ref 32.0–36.0)
MCV: 84.9 fL (ref 79.3–98.0)
MONO#: 0.9 10*3/uL (ref 0.1–0.9)
MONO%: 2.2 % (ref 0.0–14.0)
NEUT#: 6 10*3/uL (ref 1.5–6.5)
NEUT%: 14.1 % — ABNORMAL LOW (ref 39.0–75.0)
Platelets: 186 10*3/uL (ref 140–400)
RBC: 4.23 10*6/uL (ref 4.20–5.82)
RDW: 16.3 % — ABNORMAL HIGH (ref 11.0–14.6)
Retic %: 0.9 % (ref 0.80–1.80)
Retic Ct Abs: 38.07 10*3/uL (ref 34.80–93.90)
WBC: 42.3 10*3/uL — ABNORMAL HIGH (ref 4.0–10.3)
lymph#: 35 10*3/uL — ABNORMAL HIGH (ref 0.9–3.3)

## 2014-03-14 LAB — TECHNOLOGIST REVIEW

## 2014-03-14 LAB — LACTATE DEHYDROGENASE (CC13): LDH: 110 U/L — ABNORMAL LOW (ref 125–245)

## 2014-03-14 LAB — FERRITIN CHCC: Ferritin: 261 ng/ml (ref 22–316)

## 2014-03-14 LAB — IRON AND TIBC CHCC
%SAT: 15 % — ABNORMAL LOW (ref 20–55)
Iron: 40 ug/dL — ABNORMAL LOW (ref 42–163)
TIBC: 269 ug/dL (ref 202–409)
UIBC: 229 ug/dL (ref 117–376)

## 2014-03-14 NOTE — Telephone Encounter (Signed)
Gave pt appt for lab and MD for September 2015

## 2014-03-14 NOTE — Assessment & Plan Note (Signed)
This is likely related to progression of disease. I recommend he watch his blood sugar carefully. If this blood sugar continues to drop, he needs to contact his primary care provider about medication adjustment.

## 2014-03-14 NOTE — Progress Notes (Signed)
[place] OFFICE PROGRESS NOTE  Patient Care Team: Wenda Low, MD as PCP - General (Internal Medicine) [name], MD as Attending Physician (Cardiology)  SUMMARY OF ONCOLOGIC HISTORY:   CLL (chronic lymphocytic leukemia)   09/10/2013 Initial Diagnosis CLL (chronic lymphocytic leukemia)   12/26/2013 Pathology FISH analysis was normal.    INTERVAL HISTORY: Please see below for problem oriented charting. He has progressive anorexia and weight loss. He lost 6 pounds since his last visit 4 months ago. He denies new palpable lymphadenopathy. He complained of fatigue. Denies recent infection.  REVIEW OF SYSTEMS:   Constitutional: Denies fevers, chills or night sweats. Eyes: Denies blurriness of vision Ears, nose, mouth, throat, and face: Denies mucositis or sore throat Respiratory: Denies cough, dyspnea or wheezes Cardiovascular: Denies palpitation, chest discomfort or lower extremity swelling Gastrointestinal:  Denies nausea, heartburn or change in bowel habits Skin: Denies abnormal skin rashes. He complained of skin itching Lymphatics: Denies new lymphadenopathy or easy bruising Neurological:Denies numbness, tingling or new weaknesses Behavioral/Psych: Mood is stable, no new changes  All other systems were reviewed with the patient and are negative.  I have reviewed the past medical history, past surgical history, social history and family history with the patient and they are unchanged from previous note.  ALLERGIES:  has No Known Allergies.  MEDICATIONS:  Current Outpatient Prescriptions  Medication Sig Dispense Refill  . aspirin 81 MG tablet Take 81 mg by mouth daily. Every other day      . atorvastatin (LIPITOR) 40 MG tablet Take 40 mg by mouth daily.      . fish oil-omega-3 fatty acids 1000 MG capsule Take 2 g by mouth daily.       . isosorbide mononitrate (IMDUR) 30 MG 24 hr tablet Take 1 tablet (30 mg total) by mouth daily.  30 tablet  12  .  lisinopril-hydrochlorothiazide (PRINZIDE,ZESTORETIC) 10-12.5 MG per tablet Take 1 tablet by mouth daily.      . metFORMIN (GLUCOPHAGE) 500 MG tablet Take 1,000 mg by mouth 2 (two) times daily with a meal.       . metoprolol succinate (TOPROL-XL) 25 MG 24 hr tablet Take 25 mg by mouth daily.       No current facility-administered medications for this visit.    PHYSICAL EXAMINATION: ECOG PERFORMANCE STATUS: 1 - Symptomatic but completely ambulatory  Filed Vitals:   03/14/14 0810  BP: 128/70  Pulse: 51  Temp: 97.5 F (36.4 C)  Resp: 15   Filed Weights   03/14/14 0810  Weight: 293 lb 14.4 oz (133.312 kg)    GENERAL:alert, no distress and comfortable. He is morbidly obese SKIN: skin color, texture, turgor are normal, no rashes or significant lesions. Noticed scratch marks. EYES: normal, Conjunctiva are pink and non-injected, sclera clear OROPHARYNX:no exudate, no erythema and lips, buccal mucosa, and tongue normal  NECK: supple, thyroid normal size, non-tender, without nodularity LYMPH:  Palpable lymphadenopathy in his neck. None elsewhere. LUNGS: clear to auscultation and percussion with normal breathing effort HEART: regular rate & rhythm and no murmurs and no lower extremity edema ABDOMEN:abdomen soft, non-tender and normal bowel sounds Musculoskeletal:no cyanosis of digits and no clubbing  NEURO: alert & oriented x 3 with fluent speech, no focal motor/sensory deficits  LABORATORY DATA:  I have reviewed the data as listed    Component Value Date/Time   NA 140 12/26/2013 0806   K 4.3 12/26/2013 0806   CL 101 03/11/2013 1031   CO2 24 12/26/2013 0806  GLUCOSE 139 12/26/2013 0806   GLUCOSE 173* 03/11/2013 1031   GLUCOSE 128* 01/17/2013 1040   BUN 17.5 12/26/2013 0806   CREATININE 1.0 12/26/2013 0806   CALCIUM 9.6 12/26/2013 0806   PROT 7.3 12/26/2013 0806   ALBUMIN 3.5 12/26/2013 0806   AST 12 12/26/2013 0806   ALT <6 12/26/2013 0806   ALKPHOS 63 12/26/2013 0806   BILITOT 0.95 12/26/2013 0806     No results found for this basename: SPEP, UPEP,  kappa and lambda light chains    Lab Results  Component Value Date   WBC 42.3* 03/14/2014   NEUTROABS 6.0 03/14/2014   HGB 11.2* 03/14/2014   HCT 35.9* 03/14/2014   MCV 84.9 03/14/2014   PLT 186 03/14/2014      Chemistry      Component Value Date/Time   NA 140 12/26/2013 0806   K 4.3 12/26/2013 0806   CL 101 03/11/2013 1031   CO2 24 12/26/2013 0806   BUN 17.5 12/26/2013 0806   CREATININE 1.0 12/26/2013 0806      Component Value Date/Time   CALCIUM 9.6 12/26/2013 0806   ALKPHOS 63 12/26/2013 0806   AST 12 12/26/2013 0806   ALT <6 12/26/2013 0806   BILITOT 0.95 12/26/2013 0806      ASSESSMENT & PLAN:  CLL (chronic lymphocytic leukemia) Clinically, he has disease progression with now mild palpable lymphadenopathy, progressive anemia and weight loss. His lymphocyte doubling time is more than 6 months. I warned the patient that he will likely need treatment started within the next 6-12 months. I will see him back in 3 months with history, physical examination and blood work. I educated the patient signs and symptoms to watch out for disease progression.  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 The patient denies recent history of bleeding such as epistaxis, hematuria or hematochezia. He is asymptomatic from the anemia. We will observe for now.  He does not require transfusion now.    Weight loss, non-intentional This is likely related to progression of disease. I recommend he watch his blood sugar carefully. If this blood sugar continues to drop, he needs to contact his primary care provider about medication adjustment.   Orders Placed This Encounter  Procedures  . CBC & Diff and Retic    Standing Status: Future     Number of Occurrences:      Standing Expiration Date: 03/14/2015  . Lactate dehydrogenase    Standing Status: Future     Number of Occurrences:      Standing Expiration Date:  03/14/2015  . Comprehensive metabolic panel    Standing Status: Future     Number of Occurrences:      Standing Expiration Date: 03/14/2015   All questions were answered. The patient knows to call the clinic with any problems, questions or concerns. No barriers to learning was detected. I spent 25 minutes counseling the patient face to face. The total time spent in the appointment was 30 minutes and more than 50% was on counseling and review of test results     [facility], Causey, MD 03/14/2014 8:54 AM

## 2014-03-14 NOTE — Assessment & Plan Note (Signed)
This is likely anemia of chronic disease related to CLL.. The patient denies recent history of bleeding such as epistaxis, hematuria or hematochezia. He is asymptomatic from the anemia. We will observe for now.  He does not require transfusion now.

## 2014-03-14 NOTE — Assessment & Plan Note (Signed)
Clinically, he has disease progression with now mild palpable lymphadenopathy, progressive anemia and weight loss. His lymphocyte doubling time is more than 6 months. I warned the patient that he will likely need treatment started within the next 6-12 months. I will see him back in 3 months with history, physical examination and blood work. I educated the patient signs and symptoms to watch out for disease progression.

## 2014-03-17 ENCOUNTER — Ambulatory Visit: Payer: Medicare Other | Admitting: Cardiology

## 2014-03-19 ENCOUNTER — Other Ambulatory Visit: Payer: Medicare Other

## 2014-03-21 ENCOUNTER — Other Ambulatory Visit: Payer: Medicare Other

## 2014-03-21 ENCOUNTER — Ambulatory Visit (HOSPITAL_BASED_OUTPATIENT_CLINIC_OR_DEPARTMENT_OTHER): Payer: Medicare Other | Admitting: Hematology and Oncology

## 2014-03-31 ENCOUNTER — Other Ambulatory Visit: Payer: Medicare Other

## 2014-03-31 ENCOUNTER — Encounter: Payer: Self-pay | Admitting: Cardiology

## 2014-03-31 ENCOUNTER — Ambulatory Visit: Payer: Medicare Other | Admitting: Cardiology

## 2014-05-05 ENCOUNTER — Telehealth: Payer: Self-pay | Admitting: Hematology and Oncology

## 2014-05-05 NOTE — Telephone Encounter (Signed)
s.w. pt wife and she just wanted me to mail...mailed pt appt scehd anda vs letter for Sept...md out of the office appt moved

## 2014-05-07 ENCOUNTER — Other Ambulatory Visit: Payer: Medicare Other

## 2014-05-07 ENCOUNTER — Ambulatory Visit: Payer: Medicare Other | Admitting: Cardiology

## 2014-05-16 ENCOUNTER — Encounter: Payer: Self-pay | Admitting: Cardiology

## 2014-05-16 ENCOUNTER — Other Ambulatory Visit: Payer: Medicare Other

## 2014-05-16 ENCOUNTER — Ambulatory Visit (INDEPENDENT_AMBULATORY_CARE_PROVIDER_SITE_OTHER): Payer: Medicare Other | Admitting: Cardiology

## 2014-05-16 VITALS — BP 122/70 | HR 62 | Ht 76.0 in | Wt 297.0 lb

## 2014-05-16 DIAGNOSIS — I208 Other forms of angina pectoris: Secondary | ICD-10-CM

## 2014-05-16 DIAGNOSIS — I2510 Atherosclerotic heart disease of native coronary artery without angina pectoris: Secondary | ICD-10-CM | POA: Diagnosis not present

## 2014-05-16 DIAGNOSIS — E7800 Pure hypercholesterolemia, unspecified: Secondary | ICD-10-CM

## 2014-05-16 DIAGNOSIS — I209 Angina pectoris, unspecified: Secondary | ICD-10-CM

## 2014-05-16 DIAGNOSIS — I25119 Atherosclerotic heart disease of native coronary artery with unspecified angina pectoris: Secondary | ICD-10-CM

## 2014-05-16 DIAGNOSIS — I2089 Other forms of angina pectoris: Secondary | ICD-10-CM

## 2014-05-16 DIAGNOSIS — E669 Obesity, unspecified: Secondary | ICD-10-CM

## 2014-05-16 DIAGNOSIS — I10 Essential (primary) hypertension: Secondary | ICD-10-CM | POA: Diagnosis not present

## 2014-05-16 MED ORDER — METOPROLOL SUCCINATE ER 50 MG PO TB24: 50.0000 mg | ORAL_TABLET | Freq: Every day | ORAL | Status: DC

## 2014-05-16 NOTE — Patient Instructions (Signed)
Please increase your Metoprolol to 50 mg a day. Continue all other medications as listed.  Please let us know if the right arm discomfort/pain continues.  Follow up in 1 month with Dr Marlou Porch.

## 2014-05-16 NOTE — Progress Notes (Signed)
[facility]. [address] [place], [place]  [address] Phone: [phone] Fax:  [phone]  Date:  05/16/2014   ID:  Derek [name], Derek [name] Oct 26, 1940, MRN [id number]  PCP:  Wenda Low, MD   History of Present Illness: Derek [name] is a 73 y.o. male with newly discovered CAD. He be complaining of right shoulder and right upper chest discomfort with walking and occasional shortness of breath with no associated diaphoresis or nausea. He has a history of diabetes, hypertension and obesity. Dr. Lysle Rubens ordered a nuclear stress test. Stress test was performed and demonstrated evidence of mild ischemia in the basal inferolateral to mid inferolateral region. Felt to be a moderate risk myocardial perfusion study. He also had during exercise a small/brief run of possible ventricular tachycardia that was slow and was noted to have significant dyspnea on exertion.  Catheterization demonstrated mid LAD 85% stenosis immediately after bifurcation of a large diagonal branch. Distal to this lesion approximately 15 mm was another 95% stenosis, calcified. In the very distal regions of the LAD there was diffuse disease up to 80%. The large diagonal branch mentioned above also demonstrated 2 focal areas of 85% stenosis of the proximal/mid vessel. Other vessels demonstrated minor luminal irregularities. Ejection fraction 55%. Discussed with Dr. Consuello Closs.   After cardiac catheterization I started him on metoprolol as well as isosorbide. He states that with medication, he has not been feeling any further symptoms. No exertional discomfort at this point. He will monitor this closely. We did discuss the risks of taking Viagra with isosorbide. For the first 3 days he had a headache with isosorbide, this is improved.  On 03/15/13-he is interested in perhaps coming off of the isosorbide because of the limitations with Viagra. I'm willing to allow him to trial being off of this medication. Of course if he  develops anginal-like symptoms, I would like for him to be back on isosorbide. We have discussed at length. He is going to [facility]  08/28/13 - Left ankle swelling after driving from [place]. Stopped and walked around. Some pain in right arm when walking up stairs. His isosorbide remains on his drug list. He believes he is still taking this medication.  05/16/14-he is now once again complaining of symptoms of right-sided arm/shoulder discomfort, achiness when exerting himself, walking a pale, walking up stairs, any other vigorous activity. It is relieved with rest. When walking, he tries to raise his arm above his head to change or less and this symptom but usually it is rest that seems to alleviate the discomfort.  Wt Readings from Last 3 Encounters:  03/14/14 293 lb 14.4 oz (133.312 kg)  12/26/13 300 lb 1.6 oz (136.124 kg)  09/10/13 312 lb 8 oz (141.749 kg)     Past Medical History  Diagnosis Date  . Lymphocytosis   . Hypertension   . Diabetes mellitus, type II   . Obesity   . Hypertriglyceridemia   . 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 Foot surgery Right   . Vericose vein stripping    . Nasal sinus surgery      Current Outpatient Prescriptions  Medication Sig Dispense Refill  . aspirin 81 MG tablet Take 81 mg by mouth daily. Every other day      . atorvastatin (LIPITOR) 40 MG tablet Take 40 mg by mouth daily.      . fish oil-omega-3 fatty acids 1000 MG  capsule Take 2 g by mouth daily.       . isosorbide mononitrate (IMDUR) 30 MG 24 hr tablet Take 1 tablet (30 mg total) by mouth daily.  30 tablet  12  . lisinopril-hydrochlorothiazide (PRINZIDE,ZESTORETIC) 10-12.5 MG per tablet Take 1 tablet by mouth daily.      . metFORMIN (GLUCOPHAGE) 500 MG tablet Take 1,000 mg by mouth 2 (two) times daily with a meal.       . metoprolol succinate (TOPROL-XL) 25 MG 24 hr tablet Take 25 mg by mouth daily.       No current  facility-administered medications for this visit.    Allergies:   No Known Allergies  Social History:  The patient  reports that he quit smoking about 25 years ago. He has never used smokeless tobacco. He reports that he drinks about 12 ounces of alcohol per week.   ROS:  Please see the history of present illness.   No syncope, no bleeding, no orthopnea, no PND    PHYSICAL EXAM: VS:  There were no vitals taken for this visit. Well nourished, well developed, in no acute distress HEENT: normal Neck: no JVD Cardiac:  normal S1, S2; RRR; no murmur Lungs:  clear to auscultation bilaterally, no wheezing, rhonchi or rales Abd: soft, nontender, no hepatomegalyObese Ext: no edema Skin: warm and dry Neuro: no focal abnormalities noted  EKG:  05/16/14-Normal rhythm, rate 62 poor R wave progression, left axis deviation, no other changes  Cardiac catheterization 01/17/13: Stress test moderate risk with inferolateral mid to apex ischemia. 1. Mid LAD disease immediately after the bifurcation of a large diagonal branch of 85% stenosis, then 15 mm distal to that, 95% calcified stenosis, then distal LAD diffuse disease with lesions of up to 80-90%. Nitroglycerin administered with mild increase in caliber of the distal vessel. There is also small caliber posterior lateral branch 90% proximal stenosis, collateral blood flow. There is small caliber obtuse marginal branch stenosis/occlusion as well with left to left collaterals filling retrograde.  2. Normal left ventricular systolic function. LVEDP 6 mmHg. Ejection fraction 55%. No significant wall motion abnormalities. Have discussed this with Dr. Irish Lack.   ASSESSMENT AND PLAN:  1. Coronary artery disease-challenging to discern whether or not his right-sided arm symptoms are musculoskeletal versus anginal equivalent. I'm unable to reduplicate the discomfort with arm motion or palpation. He states clearly that he obtains this discomfort when walking up a  hill, stairs in his house, any other exertional activity. At first, when starting metoprolol this seemed to help. I will increase his metoprolol to 50 mg and see him back in one month. If he is still having the symptoms, I will have him referred for PCI of LAD. Aggressive secondary prevention. Disease as described above. Reviewed. I discussed the case previously with Dr. Irish Lack.  2. Angina-atypical right arm pain with motion. Could be anginal symptoms or perhaps musculoskeletal (not as convinced). 3. Obesity-continue with aggressive diet, exercise.  4. Hyperlipidemia-continue with atorvastatin. LFTs normal in June. Hemoglobin 13.6. Prior LDL 36. 5. Diabetes-Dr. Deforest Hoyles working on this. Medications reviewed. ACE inhibitor for renal protection. Creatinine 1.1 in June 2014. 6. One month f/u.  Signed, [name], MD [facility]  05/16/2014 8:34 AM

## 2014-06-11 ENCOUNTER — Telehealth: Payer: Self-pay | Admitting: Hematology and Oncology

## 2014-06-11 ENCOUNTER — Ambulatory Visit (HOSPITAL_BASED_OUTPATIENT_CLINIC_OR_DEPARTMENT_OTHER): Payer: Medicare Other | Admitting: Hematology and Oncology

## 2014-06-11 ENCOUNTER — Encounter: Payer: Self-pay | Admitting: Hematology and Oncology

## 2014-06-11 ENCOUNTER — Telehealth: Payer: Self-pay | Admitting: *Deleted

## 2014-06-11 ENCOUNTER — Other Ambulatory Visit (HOSPITAL_BASED_OUTPATIENT_CLINIC_OR_DEPARTMENT_OTHER): Payer: Medicare Other

## 2014-06-11 VITALS — BP 137/58 | HR 63 | Temp 98.0°F | Resp 18 | Ht 76.0 in | Wt 296.1 lb

## 2014-06-11 DIAGNOSIS — D649 Anemia, unspecified: Secondary | ICD-10-CM

## 2014-06-11 DIAGNOSIS — C9110 Chronic lymphocytic leukemia of B-cell type not having achieved remission: Secondary | ICD-10-CM

## 2014-06-11 DIAGNOSIS — D72829 Elevated white blood cell count, unspecified: Secondary | ICD-10-CM

## 2014-06-11 DIAGNOSIS — D630 Anemia in neoplastic disease: Secondary | ICD-10-CM

## 2014-06-11 DIAGNOSIS — Z23 Encounter for immunization: Secondary | ICD-10-CM | POA: Diagnosis not present

## 2014-06-11 LAB — COMPREHENSIVE METABOLIC PANEL (CC13)
ALT: <6 U/L (ref 0–55)
AST: 14 U/L (ref 5–34)
Albumin: 3.2 g/dL — ABNORMAL LOW (ref 3.5–5.0)
Alkaline Phosphatase: 62 U/L (ref 40–150)
Anion Gap: 10 mEq/L (ref 3–11)
BUN: 18 mg/dL (ref 7.0–26.0)
CO2: 23 mEq/L (ref 22–29)
Calcium: 9.3 mg/dL (ref 8.4–10.4)
Chloride: 107 mEq/L (ref 98–109)
Creatinine: 1 mg/dL (ref 0.7–1.3)
Glucose: 125 mg/dl (ref 70–140)
Potassium: 3.9 mEq/L (ref 3.5–5.1)
Sodium: 140 mEq/L (ref 136–145)
Total Bilirubin: 0.95 mg/dL (ref 0.20–1.20)
Total Protein: 7.4 g/dL (ref 6.4–8.3)

## 2014-06-11 LAB — CBC & DIFF AND RETIC
BASO%: 0.5 % (ref 0.0–2.0)
Basophils Absolute: 0.3 10*3/uL — ABNORMAL HIGH (ref 0.0–0.1)
EOS%: 0.2 % (ref 0.0–7.0)
Eosinophils Absolute: 0.1 10*3/uL (ref 0.0–0.5)
HCT: 30.6 % — ABNORMAL LOW (ref 38.4–49.9)
HGB: 9.2 g/dL — ABNORMAL LOW (ref 13.0–17.1)
Immature Retic Fract: 16.5 % — ABNORMAL HIGH (ref 3.00–10.60)
LYMPH%: 84.9 % — ABNORMAL HIGH (ref 14.0–49.0)
MCH: 25.5 pg — ABNORMAL LOW (ref 27.2–33.4)
MCHC: 30 g/dL — ABNORMAL LOW (ref 32.0–36.0)
MCV: 85.1 fL (ref 79.3–98.0)
MONO#: 1.3 10*3/uL — ABNORMAL HIGH (ref 0.1–0.9)
MONO%: 2.6 % (ref 0.0–14.0)
NEUT#: 6 10*3/uL (ref 1.5–6.5)
NEUT%: 11.8 % — ABNORMAL LOW (ref 39.0–75.0)
Platelets: 179 10*3/uL (ref 140–400)
RBC: 3.59 10*6/uL — ABNORMAL LOW (ref 4.20–5.82)
RDW: 17.7 % — ABNORMAL HIGH (ref 11.0–14.6)
Retic %: 2.54 % — ABNORMAL HIGH (ref 0.80–1.80)
Retic Ct Abs: 91.19 10*3/uL (ref 34.80–93.90)
WBC: 51 10*3/uL (ref 4.0–10.3)
lymph#: 43.3 10*3/uL — ABNORMAL HIGH (ref 0.9–3.3)

## 2014-06-11 LAB — TECHNOLOGIST REVIEW

## 2014-06-11 LAB — LACTATE DEHYDROGENASE (CC13): LDH: 145 U/L (ref 125–245)

## 2014-06-11 MED ORDER — INFLUENZA VAC SPLIT QUAD 0.5 ML IM SUSY
0.5000 mL | PREFILLED_SYRINGE | Freq: Once | INTRAMUSCULAR | Status: AC
  Administered 2014-06-11: 0.5 mL via INTRAMUSCULAR
  Filled 2014-06-11: qty 0.5

## 2014-06-11 NOTE — Telephone Encounter (Signed)
Pt confirmed labs/ov per 09/23 POF, sent msg to add chemo,gave pt AVS....KJ °

## 2014-06-11 NOTE — Progress Notes (Signed)
[facility] OFFICE PROGRESS NOTE  Patient Care Team: Wenda Low, MD as PCP - General (Internal Medicine) [name], MD as Attending Physician (Cardiology)  SUMMARY OF ONCOLOGIC HISTORY:   CLL (chronic lymphocytic leukemia)   09/10/2013 Initial Diagnosis CLL (chronic lymphocytic leukemia)   12/26/2013 Pathology Results FISH analysis was normal.    INTERVAL HISTORY: Please see below for problem oriented charting. The patient complained of progressive fatigue. He denies new lymphadenopathy. Denies recent infection.   REVIEW OF SYSTEMS:   Constitutional: Denies fevers, chills or abnormal weight loss Eyes: Denies blurriness of vision Ears, nose, mouth, throat, and face: Denies mucositis or sore throat Respiratory: Denies cough, dyspnea or wheezes Cardiovascular: Denies palpitation, chest discomfort or lower extremity swelling Gastrointestinal:  Denies nausea, heartburn or change in bowel habits Skin: Denies abnormal skin rashes Lymphatics: Denies new lymphadenopathy or easy bruising Neurological:Denies numbness, tingling or new weaknesses Behavioral/Psych: Mood is stable, no new changes  All other systems were reviewed with the patient and are negative.  I have reviewed the past medical history, past surgical history, social history and family history with the patient and they are unchanged from previous note.  ALLERGIES:  has No Known Allergies.  MEDICATIONS:  Current Outpatient Prescriptions  Medication Sig Dispense Refill  . aspirin 81 MG tablet Take 81 mg by mouth daily. Every other day      . atorvastatin (LIPITOR) 40 MG tablet Take 40 mg by mouth daily.      . fish oil-omega-3 fatty acids 1000 MG capsule Take 2 g by mouth daily.       . isosorbide mononitrate (IMDUR) 30 MG 24 hr tablet Take 1 tablet (30 mg total) by mouth daily.  30 tablet  12  . lisinopril-hydrochlorothiazide (PRINZIDE,ZESTORETIC) 10-12.5 MG per tablet Take 1 tablet by mouth daily.       . metFORMIN (GLUCOPHAGE) 500 MG tablet Take 1,000 mg by mouth 2 (two) times daily with a meal.       . metoprolol succinate (TOPROL-XL) 50 MG 24 hr tablet Take 1 tablet (50 mg total) by mouth daily.  90 tablet  3   No current facility-administered medications for this visit.    PHYSICAL EXAMINATION: ECOG PERFORMANCE STATUS: 1 - Symptomatic but completely ambulatory  Filed Vitals:   06/11/14 0852  BP: 137/58  Pulse: 63  Temp: 98 F (36.7 C)  Resp: 18   Filed Weights   06/11/14 0852  Weight: 296 lb 1.6 oz (134.31 kg)    GENERAL:alert, no distress and comfortable. His morbidly obese  SKIN: skin color, texture, turgor are normal, no rashes or significant lesions EYES: normal, Conjunctiva are pink and non-injected, sclera clear OROPHARYNX:no exudate, no erythema and lips, buccal mucosa, and tongue normal  NECK: supple, thyroid normal size, non-tender, without nodularity LYMPH:  no palpable lymphadenopathy in the cervical, axillary or inguinal LUNGS: clear to auscultation and percussion with normal breathing effort HEART: regular rate & rhythm and no murmurs and no lower extremity edema ABDOMEN:abdomen soft, non-tender and normal bowel sounds Musculoskeletal:no cyanosis of digits and no clubbing  NEURO: alert & oriented x 3 with fluent speech, no focal motor/sensory deficits  LABORATORY DATA:  I have reviewed the data as listed    Component Value Date/Time   NA 140 06/11/2014 0834   K 3.9 06/11/2014 0834   CL 101 03/11/2013 1031   CO2 23 06/11/2014 0834   GLUCOSE 125 06/11/2014 0834   GLUCOSE 173* 03/11/2013 1031   GLUCOSE 128* 01/17/2013 1040  BUN 18.0 06/11/2014 0834   CREATININE 1.0 06/11/2014 0834   CALCIUM 9.3 06/11/2014 0834   PROT 7.4 06/11/2014 0834   ALBUMIN 3.2* 06/11/2014 0834   AST 14 06/11/2014 0834   ALT <6 06/11/2014 0834   ALKPHOS 62 06/11/2014 0834   BILITOT 0.95 06/11/2014 0834    No results found for this basename: SPEP, UPEP,  kappa and lambda light chains     Lab Results  Component Value Date   WBC 51.0* 06/11/2014   NEUTROABS 6.0 06/11/2014   HGB 9.2* 06/11/2014   HCT 30.6* 06/11/2014   MCV 85.1 06/11/2014   PLT 179 06/11/2014      Chemistry      Component Value Date/Time   NA 140 06/11/2014 0834   K 3.9 06/11/2014 0834   CL 101 03/11/2013 1031   CO2 23 06/11/2014 0834   BUN 18.0 06/11/2014 0834   CREATININE 1.0 06/11/2014 0834      Component Value Date/Time   CALCIUM 9.3 06/11/2014 0834   ALKPHOS 62 06/11/2014 0834   AST 14 06/11/2014 0834   ALT <6 06/11/2014 0834   BILITOT 0.95 06/11/2014 0834     ASSESSMENT & PLAN:  CLL (chronic lymphocytic leukemia) He has progressive leukocytosis and anemia and the patient is symptomatic. I recommend CT scan of the chest, abdomen and pelvis, bone marrow biopsy, chemotherapy education class and port placement for future treatment and he agreed to proceed.  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 At present time, he does not require blood transfusion. We'll monitor his blood counts carefully. We will proceed to work him up with a plan to start treatment in the near future.   Orders Placed This Encounter  Procedures  . CT Chest W Contrast    Standing Status: Future     Number of Occurrences:      Standing Expiration Date: 09/11/2015    Order Specific Question:  Reason for Exam (SYMPTOM  OR DIAGNOSIS REQUIRED)    Answer:  staging CLL    Order Specific Question:  Preferred imaging location?    Answer:  [facility]  . CT Abdomen Pelvis W Contrast    Standing Status: Future     Number of Occurrences:      Standing Expiration Date: 09/11/2015    Order Specific Question:  Reason for Exam (SYMPTOM  OR DIAGNOSIS REQUIRED)    Answer:  staging CLL    Order Specific Question:  Preferred imaging location?    Answer:  [facility]    Indicate type of CVC ordering    Standing Status: Future     Number of Occurrences:       Standing Expiration Date: 08/12/2015    Order Specific Question:  Reason for exam:    Answer:  need port for chemo    Order Specific Question:  Preferred Imaging Location?    Answer:  [facility]  . CBC & Diff and Retic    Standing Status: Future     Number of Occurrences:      Standing Expiration Date: 07/16/2015  . Comprehensive metabolic panel    Standing Status: Future     Number of Occurrences:      Standing Expiration Date: 07/16/2015  . Lactate dehydrogenase    Standing Status: Future     Number of Occurrences:      Standing Expiration Date: 07/16/2015  . Hepatitis B core antibody,  IgM    Standing Status: Future     Number of Occurrences:      Standing Expiration Date: 07/16/2015  . Hepatitis B surface antibody    Standing Status: Future     Number of Occurrences:      Standing Expiration Date: 07/16/2015  . Hepatitis B surface antigen    Standing Status: Future     Number of Occurrences:      Standing Expiration Date: 07/16/2015   All questions were answered. The patient knows to call the clinic with any problems, questions or concerns. No barriers to learning was detected. I spent 30 minutes counseling the patient face to face. The total time spent in the appointment was 40 minutes and more than 50% was on counseling and review of test results     [facility], Garceno, MD 06/11/2014 4:05 PM

## 2014-06-11 NOTE — Telephone Encounter (Signed)
Per staff message from desk RN I have scheduled appts.  

## 2014-06-11 NOTE — Assessment & Plan Note (Signed)
This is due to his underlying disease. At present time, he does not require blood transfusion. We'll monitor his blood counts carefully. We will proceed to work him up with a plan to start treatment in the near future.

## 2014-06-11 NOTE — Telephone Encounter (Signed)
Per staff message and POF I have scheduled appts. Advised scheduler of appts. JMW  

## 2014-06-11 NOTE — Assessment & Plan Note (Signed)
He has progressive leukocytosis and anemia and the patient is symptomatic. I recommend CT scan of the chest, abdomen and pelvis, bone marrow biopsy, chemotherapy education class and port placement for future treatment and he agreed to proceed.

## 2014-06-12 ENCOUNTER — Telehealth: Payer: Self-pay | Admitting: *Deleted

## 2014-06-12 ENCOUNTER — Ambulatory Visit: Payer: Medicare Other | Admitting: Hematology and Oncology

## 2014-06-12 ENCOUNTER — Other Ambulatory Visit: Payer: Medicare Other

## 2014-06-12 NOTE — Telephone Encounter (Signed)
BMBx arranged in infusion room on 10/01.  Notified Carter in [employer].  Date and time information given to pt's wife and she wrote it down and read it back to this nurse.  Asked pt to call us back if any questions.  She verbalized understanding.

## 2014-06-13 ENCOUNTER — Ambulatory Visit (HOSPITAL_COMMUNITY): Admission: RE | Admit: 2014-06-13 | Payer: Medicare Other | Source: Ambulatory Visit

## 2014-06-14 ENCOUNTER — Other Ambulatory Visit: Payer: Self-pay | Admitting: Radiology

## 2014-06-16 ENCOUNTER — Encounter: Payer: Self-pay | Admitting: Cardiology

## 2014-06-16 ENCOUNTER — Ambulatory Visit (INDEPENDENT_AMBULATORY_CARE_PROVIDER_SITE_OTHER): Payer: Medicare Other | Admitting: Cardiology

## 2014-06-16 VITALS — BP 150/74 | HR 68 | Ht 76.0 in | Wt 296.0 lb

## 2014-06-16 DIAGNOSIS — I209 Angina pectoris, unspecified: Secondary | ICD-10-CM | POA: Diagnosis not present

## 2014-06-16 DIAGNOSIS — I10 Essential (primary) hypertension: Secondary | ICD-10-CM

## 2014-06-16 DIAGNOSIS — E669 Obesity, unspecified: Secondary | ICD-10-CM | POA: Diagnosis not present

## 2014-06-16 DIAGNOSIS — D630 Anemia in neoplastic disease: Secondary | ICD-10-CM | POA: Diagnosis not present

## 2014-06-16 DIAGNOSIS — I25119 Atherosclerotic heart disease of native coronary artery with unspecified angina pectoris: Secondary | ICD-10-CM

## 2014-06-16 DIAGNOSIS — I2510 Atherosclerotic heart disease of native coronary artery without angina pectoris: Secondary | ICD-10-CM

## 2014-06-16 DIAGNOSIS — I208 Other forms of angina pectoris: Secondary | ICD-10-CM | POA: Diagnosis not present

## 2014-06-16 DIAGNOSIS — C9110 Chronic lymphocytic leukemia of B-cell type not having achieved remission: Secondary | ICD-10-CM | POA: Diagnosis not present

## 2014-06-16 MED ORDER — ISOSORBIDE MONONITRATE ER 60 MG PO TB24: 60.0000 mg | ORAL_TABLET | Freq: Every day | ORAL | Status: DC

## 2014-06-16 NOTE — Progress Notes (Signed)
[place]. [address], [address] [place], [place]  [address] Phone: [phone] Fax:  [phone]  Date:  06/16/2014   ID:  Derek [name] 05/07/1941, MRN [id number]  PCP:  Wenda Low, MD   History of Present Illness: Derek [name] is a 73 y.o. male with CAD. He had been complaining of right shoulder and right upper chest discomfort with walking and occasional shortness of breath with no associated diaphoresis or nausea. He has a history of diabetes, hypertension and obesity. Dr. Lysle Rubens ordered a nuclear stress test. Stress test was performed and demonstrated evidence of mild ischemia in the basal inferolateral to mid inferolateral region. Felt to be a moderate risk myocardial perfusion study. He also had during exercise a small/brief run of possible ventricular tachycardia that was slow and was noted to have significant dyspnea on exertion.  Catheterization demonstrated mid LAD 85% stenosis immediately after bifurcation of a large diagonal branch. Distal to this lesion approximately 15 mm was another 95% stenosis, calcified. In the very distal regions of the LAD there was diffuse disease up to 80%. The large diagonal branch mentioned above also demonstrated 2 focal areas of 85% stenosis of the proximal/mid vessel. Other vessels demonstrated minor luminal irregularities. Ejection fraction 55%. Discussed with Dr. Consuello Closs.   After cardiac catheterization I started him on metoprolol as well as isosorbide. He states that with medication, he has not been feeling any further symptoms. No exertional discomfort at this point. He will monitor this closely. We did discuss the risks of taking Viagra with isosorbide. For the first 3 days he had a headache with isosorbide, this is improved.  On 03/15/13-he is interested in perhaps coming off of the isosorbide because of the limitations with Viagra. I'm willing to allow him to trial being off of this medication. Of course if he develops  anginal-like symptoms, I would like for him to be back on isosorbide. We have discussed at length. He is going to [facility]  08/28/13 - Left ankle swelling after driving from [place]. Stopped and walked around. Some pain in right arm when walking up stairs. His isosorbide remains on his drug list. He believes he is still taking this medication.  05/16/14-he is now once again complaining of symptoms of right-sided arm/shoulder discomfort, achiness when exerting himself,  walking up stairs, any other vigorous activity. It is relieved with rest. When walking, he tries to raise his arm above his head to change or less and this symptom but usually it is rest that seems to alleviate the discomfort.  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 He does have associated anemia with this and likely this is contributing to his symptoms. He still will feel right shoulder/arm pain with some physical activity however. He is taking his medicine. The increase in the Toprol seems to have helped.  Wt Readings from Last 3 Encounters:  06/16/14 296 lb (134.265 kg)  06/11/14 296 lb 1.6 oz (134.31 kg)  05/16/14 297 lb (134.718 kg)     Past Medical History  Diagnosis Date  . Lymphocytosis   . Hypertension   . Diabetes mellitus, type II   . Obesity   . Hypertriglyceridemia   . 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 Foot surgery Right   . Vericose vein stripping    . Nasal sinus surgery  Current Outpatient Prescriptions  Medication Sig Dispense Refill  . aspirin 81 MG tablet Take 81 mg by mouth daily. Every other day      . atorvastatin (LIPITOR) 40 MG tablet Take 40 mg by mouth daily.      . fish oil-omega-3 fatty acids 1000 MG capsule Take 2 g by mouth daily.       . isosorbide mononitrate (IMDUR) 30 MG 24 hr tablet Take 1 tablet (30 mg total) by mouth  daily.  30 tablet  12  . lisinopril-hydrochlorothiazide (PRINZIDE,ZESTORETIC) 10-12.5 MG per tablet Take 1 tablet by mouth daily.      . metFORMIN (GLUCOPHAGE) 500 MG tablet Take 1,000 mg by mouth 2 (two) times daily with a meal.       . metoprolol succinate (TOPROL-XL) 50 MG 24 hr tablet Take 1 tablet (50 mg total) by mouth daily.  90 tablet  3   No current facility-administered medications for this visit.    Allergies:   No Known Allergies  Social History:  The patient  reports that he quit smoking about 25 years ago. He has never used smokeless tobacco. He reports that he drinks about 12 ounces of alcohol per week.   ROS:  Please see the history of present illness.   No syncope, no bleeding, no orthopnea, no PND    PHYSICAL EXAM: VS:  BP 150/74  Pulse 68  Ht 6\' 4"  (1.93 m)  Wt 296 lb (134.265 kg)  BMI 36.05 kg/m2 Well nourished, well developed, in no acute distress HEENT: normal Neck: no JVD Cardiac:  normal S1, S2; RRR; no murmur Lungs:  clear to auscultation bilaterally, no wheezing, rhonchi or rales Abd: soft, nontender, no hepatomegalyObese Ext: no edema Skin: warm and dry Neuro: no focal abnormalities noted  EKG:  05/16/14-Normal rhythm, rate 62 poor R wave progression, left axis deviation, no other changes  Cardiac catheterization 01/17/13: Stress test moderate risk with inferolateral mid to apex ischemia. 1. Mid LAD disease immediately after the bifurcation of a large diagonal branch of 85% stenosis, then 15 mm distal to that, 95% calcified stenosis, then distal LAD diffuse disease with lesions of up to 80-90%. Nitroglycerin administered with mild increase in caliber of the distal vessel. There is also small caliber posterior lateral branch 90% proximal stenosis, collateral blood flow. There is small caliber obtuse marginal branch stenosis/occlusion as well with left to left collaterals filling retrograde.  2. Normal left ventricular systolic function. LVEDP 6 mmHg. Ejection  fraction 55%. No significant wall motion abnormalities. Have discussed this with Dr. Irish Lack.   ASSESSMENT AND PLAN:  1. Coronary artery disease-challenging to discern whether or not his right-sided arm symptoms are musculoskeletal versus anginal equivalent. I'm unable to reduplicate the discomfort with arm motion or palpation. He states clearly that he obtains this discomfort when walking up a hill, stairs in his house, any other exertional activity. At first, when starting metoprolol this seemed to help. I  increased his metoprolol to 50 mg and this helped a little bit.. I'm increasing his isosorbide to 60 mg from 30. Aggressive secondary prevention. Disease as described above. Reviewed. I discussed the case previously with Dr. Irish Lack.  2. CLL - Hg 9.2, WBC 51. Dr. Alvy Bimler. Likely contributing to his fatigue like symptoms. 3. Angina-atypical right arm pain with motion. Could be anginal symptoms or perhaps musculoskeletal (not as convinced). 4. Obesity-continue with aggressive diet, exercise.  5. Hyperlipidemia-continue with atorvastatin. LFTs normal in June. Hemoglobin 13.6. Prior LDL 36. 6. Diabetes-Dr. Deforest Hoyles  working on this. Medications reviewed. ACE inhibitor for renal protection. Creatinine 1.1 in June 2014. 7. 4 month f/u.  Signed, [name], MD  [facility]  06/16/2014 4:15 PM

## 2014-06-16 NOTE — Patient Instructions (Addendum)
Please increase your Imdur to 60 mg a day. Continue all other medications as listed  Follow up in 4 months with Dr. Marlou Porch.  You will receive a letter in the mail 2 months before you are due.  Please call us when you receive this letter to schedule your follow up appointment.

## 2014-06-17 ENCOUNTER — Ambulatory Visit (HOSPITAL_COMMUNITY)
Admission: RE | Admit: 2014-06-17 | Discharge: 2014-06-17 | Disposition: A | Discharge status: Home / Self Care | Payer: Medicare Other | Source: Ambulatory Visit | Attending: Hematology and Oncology | Admitting: Hematology and Oncology

## 2014-06-17 ENCOUNTER — Encounter: Payer: Self-pay | Admitting: *Deleted

## 2014-06-17 ENCOUNTER — Encounter (HOSPITAL_COMMUNITY): Payer: Self-pay

## 2014-06-17 ENCOUNTER — Other Ambulatory Visit: Payer: Medicare Other

## 2014-06-17 ENCOUNTER — Other Ambulatory Visit: Payer: Self-pay | Admitting: Hematology and Oncology

## 2014-06-17 DIAGNOSIS — E669 Obesity, unspecified: Secondary | ICD-10-CM | POA: Insufficient documentation

## 2014-06-17 DIAGNOSIS — Z79899 Other long term (current) drug therapy: Secondary | ICD-10-CM | POA: Diagnosis not present

## 2014-06-17 DIAGNOSIS — I10 Essential (primary) hypertension: Secondary | ICD-10-CM | POA: Insufficient documentation

## 2014-06-17 DIAGNOSIS — Z23 Encounter for immunization: Secondary | ICD-10-CM

## 2014-06-17 DIAGNOSIS — C9110 Chronic lymphocytic leukemia of B-cell type not having achieved remission: Secondary | ICD-10-CM | POA: Diagnosis not present

## 2014-06-17 DIAGNOSIS — Z87891 Personal history of nicotine dependence: Secondary | ICD-10-CM | POA: Insufficient documentation

## 2014-06-17 DIAGNOSIS — Z452 Encounter for adjustment and management of vascular access device: Secondary | ICD-10-CM | POA: Diagnosis not present

## 2014-06-17 DIAGNOSIS — Z7982 Long term (current) use of aspirin: Secondary | ICD-10-CM | POA: Diagnosis not present

## 2014-06-17 DIAGNOSIS — E119 Type 2 diabetes mellitus without complications: Secondary | ICD-10-CM | POA: Insufficient documentation

## 2014-06-17 LAB — GLUCOSE, CAPILLARY: Glucose-Capillary: 105 mg/dL — ABNORMAL HIGH (ref 70–99)

## 2014-06-17 LAB — CBC WITH DIFFERENTIAL/PLATELET
Basophils Absolute: 0 10*3/uL (ref 0.0–0.1)
Basophils Relative: 0 % (ref 0–1)
Eosinophils Absolute: 0 10*3/uL (ref 0.0–0.7)
Eosinophils Relative: 0 % (ref 0–5)
HCT: 28.1 % — ABNORMAL LOW (ref 39.0–52.0)
Hemoglobin: 8.7 g/dL — ABNORMAL LOW (ref 13.0–17.0)
Lymphocytes Relative: 84 % — ABNORMAL HIGH (ref 12–46)
Lymphs Abs: 39.5 10*3/uL — ABNORMAL HIGH (ref 0.7–4.0)
MCH: 25.9 pg — ABNORMAL LOW (ref 26.0–34.0)
MCHC: 31 g/dL (ref 30.0–36.0)
MCV: 83.6 fL (ref 78.0–100.0)
Monocytes Absolute: 1.9 10*3/uL — ABNORMAL HIGH (ref 0.1–1.0)
Monocytes Relative: 4 % (ref 3–12)
Neutro Abs: 5.7 10*3/uL (ref 1.7–7.7)
Neutrophils Relative %: 12 % — ABNORMAL LOW (ref 43–77)
Platelets: 179 10*3/uL (ref 150–400)
RBC: 3.36 MIL/uL — ABNORMAL LOW (ref 4.22–5.81)
RDW: 16.9 % — ABNORMAL HIGH (ref 11.5–15.5)
WBC: 47.1 10*3/uL — ABNORMAL HIGH (ref 4.0–10.5)

## 2014-06-17 LAB — PROTIME-INR
INR: 1.16 (ref 0.00–1.49)
Prothrombin Time: 14.8 seconds (ref 11.6–15.2)

## 2014-06-17 LAB — APTT: aPTT: 23 seconds — ABNORMAL LOW (ref 24–37)

## 2014-06-17 MED ORDER — MIDAZOLAM HCL 2 MG/2ML IJ SOLN
INTRAMUSCULAR | Status: AC | PRN
  Administered 2014-06-17: 2 mg via INTRAVENOUS

## 2014-06-17 MED ORDER — FENTANYL CITRATE 0.05 MG/ML IJ SOLN
INTRAMUSCULAR | Status: AC
  Filled 2014-06-17: qty 4

## 2014-06-17 MED ORDER — SODIUM CHLORIDE 0.9 % IV SOLN
INTRAVENOUS | Status: DC
  Administered 2014-06-17: 13:00:00 via INTRAVENOUS

## 2014-06-17 MED ORDER — MIDAZOLAM HCL 2 MG/2ML IJ SOLN
INTRAMUSCULAR | Status: AC
  Filled 2014-06-17: qty 4

## 2014-06-17 MED ORDER — HEPARIN SOD (PORK) LOCK FLUSH 100 UNIT/ML IV SOLN
500.0000 [IU] | Freq: Once | INTRAVENOUS | Status: AC
  Administered 2014-06-17: 500 [IU] via INTRAVENOUS

## 2014-06-17 MED ORDER — HEPARIN SOD (PORK) LOCK FLUSH 100 UNIT/ML IV SOLN
INTRAVENOUS | Status: AC
  Filled 2014-06-17: qty 5

## 2014-06-17 MED ORDER — LIDOCAINE HCL 1 % IJ SOLN
INTRAMUSCULAR | Status: AC
  Filled 2014-06-17: qty 20

## 2014-06-17 MED ORDER — CEFAZOLIN SODIUM 10 G IJ SOLR
3.0000 g | INTRAMUSCULAR | Status: AC
  Administered 2014-06-17: 3 g via INTRAVENOUS
  Filled 2014-06-17: qty 3000

## 2014-06-17 MED ORDER — FENTANYL CITRATE 0.05 MG/ML IJ SOLN
INTRAMUSCULAR | Status: AC | PRN
  Administered 2014-06-17: 100 ug via INTRAVENOUS

## 2014-06-17 NOTE — Discharge Instructions (Signed)

## 2014-06-17 NOTE — Procedures (Signed)
Interventional Radiology Procedure Note  Procedure: Placement of a right IJ approach single lumen PowerPort.  Tip is positioned at the superior cavoatrial junction and catheter is ready for immediate use.  Complications: No immediate Recommendations:  - Ok to shower tomorrow - Do not submerge for 7 days - Routine line care   Signed,  Izack Hoogland S. Cordai Rodrigue, DO    

## 2014-06-17 NOTE — H&P (Signed)
Chief Complaint: "I'm here for a port a cath"  Referring Physician(s): Gorsuch,Ni  History of Present Illness: Derek [name] is a 73 y.o. male with history of CLL who presents today for port a cath placement for chemotherapy.  Past Medical History  Diagnosis Date  . Lymphocytosis   . Hypertension   . Diabetes mellitus, type II   . Obesity   . Hypertriglyceridemia   . 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 Foot surgery Right   . Vericose vein stripping    . Nasal sinus surgery      Allergies: Review of patient's allergies indicates no known allergies.  Medications: Prior to Admission medications   Medication Sig Start Date End Date Taking? Authorizing Provider  aspirin 81 MG tablet Take 81 mg by mouth daily. Every other day   Yes Historical Provider, MD  atorvastatin (LIPITOR) 40 MG tablet Take 40 mg by mouth daily. 02/27/13  Yes Historical Provider, MD  fish oil-omega-3 fatty acids 1000 MG capsule Take 2 g by mouth daily.    Yes Historical Provider, MD  isosorbide mononitrate (IMDUR) 60 MG 24 hr tablet Take 1 tablet (60 mg total) by mouth daily. 06/16/14  Yes [name], MD  lisinopril-hydrochlorothiazide (PRINZIDE,ZESTORETIC) 10-12.5 MG per tablet Take 1 tablet by mouth daily.   Yes Historical Provider, MD  metFORMIN (GLUCOPHAGE) 500 MG tablet Take 1,000 mg by mouth 2 (two) times daily with a meal.    Yes Historical Provider, MD  metoprolol succinate (TOPROL-XL) 50 MG 24 hr tablet Take 1 tablet (50 mg total) by mouth daily. 05/16/14  Yes [name], MD    Family History  Problem Relation Age of Onset  . Heart attack Mother   . Cancer Father     liver    History   Social History  . Marital Status: Married    Spouse Name: N/A    Number of Children: 3  . Years of Education: N/A   Occupational History  .      retied Audiological scientist; Materials engineer work    Social History Main Topics  . Smoking status: Former  Smoker -- 3.00 packs/day for 20 years    Quit date: 09/19/1988  . Smokeless tobacco: Never Used  . Alcohol Use: 12.0 oz/week    20 Shots of liquor per week  . Drug Use: None  . Sexual Activity: None   Other Topics Concern  . None   Social History Narrative  . None         Review of Systems  Constitutional: Positive for fatigue. Negative for fever and chills.  Respiratory: Negative for cough and shortness of breath.   Cardiovascular: Negative for chest pain.  Gastrointestinal: Negative for nausea, vomiting, abdominal pain and blood in stool.  Genitourinary: Negative for dysuria and hematuria.  Musculoskeletal: Negative for back pain.  Neurological: Negative for headaches.  Hematological: Does not bruise/bleed easily.    Vital Signs: BP 141/62  Pulse 59  Temp(Src) 98.1 F (36.7 C) (Oral)  Resp 18  SpO2 96%  Physical Exam  Constitutional: He is oriented to person, place, and time. He appears well-developed and well-nourished.  Cardiovascular: Normal rate and regular rhythm.   Pulmonary/Chest: Effort normal and breath sounds normal.  Abdominal: Soft. Bowel sounds are normal. There is no tenderness.  obese  Musculoskeletal: Normal range of motion. He exhibits edema.  Neurological: He is alert and oriented to person, place, and time.  Imaging: No results found.  Labs: Lab Results  Component Value Date   WBC 47.1* 06/17/2014   HCT 28.1* 06/17/2014   MCV 83.6 06/17/2014   PLT 179 06/17/2014   NA 140 06/11/2014   K 3.9 06/11/2014   CL 101 03/11/2013   CO2 23 06/11/2014   GLUCOSE 125 06/11/2014   BUN 18.0 06/11/2014   CREATININE 1.0 06/11/2014   CALCIUM 9.3 06/11/2014   PROT 7.4 06/11/2014   ALBUMIN 3.2* 06/11/2014   AST 14 06/11/2014   ALT <6 06/11/2014   ALKPHOS 62 06/11/2014   BILITOT 0.95 06/11/2014   INR 1.16 06/17/2014    Assessment and Plan: Derek [name] is a 73 y.o. male with history of CLL who presents today for port a cath placement for chemotherapy.  Details/risks of procedure d/w pt/son with their understanding and consent.          Signed: Autumn Messing 06/17/2014, 1:57 PM

## 2014-06-18 ENCOUNTER — Encounter (HOSPITAL_COMMUNITY): Payer: Self-pay

## 2014-06-18 ENCOUNTER — Ambulatory Visit (HOSPITAL_COMMUNITY)
Admission: RE | Admit: 2014-06-18 | Discharge: 2014-06-18 | Disposition: A | Discharge status: Home / Self Care | Payer: Medicare Other | Source: Ambulatory Visit | Attending: Hematology and Oncology | Admitting: Hematology and Oncology

## 2014-06-18 DIAGNOSIS — J90 Pleural effusion, not elsewhere classified: Secondary | ICD-10-CM | POA: Insufficient documentation

## 2014-06-18 DIAGNOSIS — C9110 Chronic lymphocytic leukemia of B-cell type not having achieved remission: Secondary | ICD-10-CM | POA: Diagnosis not present

## 2014-06-18 DIAGNOSIS — Z23 Encounter for immunization: Secondary | ICD-10-CM

## 2014-06-18 DIAGNOSIS — R599 Enlarged lymph nodes, unspecified: Secondary | ICD-10-CM | POA: Insufficient documentation

## 2014-06-18 DIAGNOSIS — K5730 Diverticulosis of large intestine without perforation or abscess without bleeding: Secondary | ICD-10-CM | POA: Diagnosis not present

## 2014-06-18 DIAGNOSIS — N200 Calculus of kidney: Secondary | ICD-10-CM | POA: Insufficient documentation

## 2014-06-18 DIAGNOSIS — R161 Splenomegaly, not elsewhere classified: Secondary | ICD-10-CM | POA: Diagnosis not present

## 2014-06-18 MED ORDER — IOHEXOL 300 MG/ML  SOLN
100.0000 mL | Freq: Once | INTRAMUSCULAR | Status: AC | PRN
  Administered 2014-06-18: 100 mL via INTRAVENOUS

## 2014-06-19 ENCOUNTER — Other Ambulatory Visit (HOSPITAL_BASED_OUTPATIENT_CLINIC_OR_DEPARTMENT_OTHER): Payer: Medicare Other

## 2014-06-19 ENCOUNTER — Encounter: Payer: Self-pay | Admitting: Hematology and Oncology

## 2014-06-19 ENCOUNTER — Other Ambulatory Visit (HOSPITAL_COMMUNITY)
Admission: RE | Admit: 2014-06-19 | Discharge: 2014-06-19 | Disposition: A | Discharge status: Home / Self Care | Payer: Medicare Other | Source: Ambulatory Visit | Attending: Hematology and Oncology | Admitting: Hematology and Oncology

## 2014-06-19 ENCOUNTER — Ambulatory Visit (HOSPITAL_BASED_OUTPATIENT_CLINIC_OR_DEPARTMENT_OTHER): Payer: Medicare Other | Admitting: Hematology and Oncology

## 2014-06-19 VITALS — BP 154/64 | HR 82 | Temp 98.2°F | Resp 20

## 2014-06-19 DIAGNOSIS — C9110 Chronic lymphocytic leukemia of B-cell type not having achieved remission: Secondary | ICD-10-CM | POA: Diagnosis not present

## 2014-06-19 DIAGNOSIS — Z23 Encounter for immunization: Secondary | ICD-10-CM | POA: Diagnosis not present

## 2014-06-19 DIAGNOSIS — C8309 Small cell B-cell lymphoma, extranodal and solid organ sites: Secondary | ICD-10-CM | POA: Diagnosis not present

## 2014-06-19 LAB — COMPREHENSIVE METABOLIC PANEL (CC13)
ALT: 7 U/L (ref 0–55)
AST: 11 U/L (ref 5–34)
Albumin: 3.1 g/dL — ABNORMAL LOW (ref 3.5–5.0)
Alkaline Phosphatase: 61 U/L (ref 40–150)
Anion Gap: 9 mEq/L (ref 3–11)
BUN: 15.1 mg/dL (ref 7.0–26.0)
CO2: 25 mEq/L (ref 22–29)
Calcium: 9.5 mg/dL (ref 8.4–10.4)
Chloride: 104 mEq/L (ref 98–109)
Creatinine: 1 mg/dL (ref 0.7–1.3)
Glucose: 160 mg/dl — ABNORMAL HIGH (ref 70–140)
Potassium: 3.6 mEq/L (ref 3.5–5.1)
Sodium: 138 mEq/L (ref 136–145)
Total Bilirubin: 0.91 mg/dL (ref 0.20–1.20)
Total Protein: 7.3 g/dL (ref 6.4–8.3)

## 2014-06-19 LAB — CBC & DIFF AND RETIC
BASO%: 0.3 % (ref 0.0–2.0)
Basophils Absolute: 0.2 10*3/uL — ABNORMAL HIGH (ref 0.0–0.1)
EOS%: 0.3 % (ref 0.0–7.0)
Eosinophils Absolute: 0.2 10*3/uL (ref 0.0–0.5)
HCT: 28.4 % — ABNORMAL LOW (ref 38.4–49.9)
HGB: 8.7 g/dL — ABNORMAL LOW (ref 13.0–17.1)
Immature Retic Fract: 13.3 % — ABNORMAL HIGH (ref 3.00–10.60)
LYMPH%: 87.7 % — ABNORMAL HIGH (ref 14.0–49.0)
MCH: 25.6 pg — ABNORMAL LOW (ref 27.2–33.4)
MCHC: 30.7 g/dL — ABNORMAL LOW (ref 32.0–36.0)
MCV: 83.2 fL (ref 79.3–98.0)
MONO#: 0.6 10*3/uL (ref 0.1–0.9)
MONO%: 1.1 % (ref 0.0–14.0)
NEUT#: 5.8 10*3/uL (ref 1.5–6.5)
NEUT%: 10.6 % — ABNORMAL LOW (ref 39.0–75.0)
Platelets: 200 10*3/uL (ref 140–400)
RBC: 3.41 10*6/uL — ABNORMAL LOW (ref 4.20–5.82)
RDW: 18.1 % — ABNORMAL HIGH (ref 11.0–14.6)
Retic %: 2.65 % — ABNORMAL HIGH (ref 0.80–1.80)
Retic Ct Abs: 90.37 10*3/uL (ref 34.80–93.90)
WBC: 54.3 10*3/uL (ref 4.0–10.3)
lymph#: 47.6 10*3/uL — ABNORMAL HIGH (ref 0.9–3.3)

## 2014-06-19 LAB — BONE MARROW EXAM

## 2014-06-19 LAB — HEPATITIS B CORE ANTIBODY, IGM: Hep B C IgM: NONREACTIVE

## 2014-06-19 LAB — LACTATE DEHYDROGENASE (CC13): LDH: 152 U/L (ref 125–245)

## 2014-06-19 LAB — TECHNOLOGIST REVIEW

## 2014-06-19 LAB — HEPATITIS B SURFACE ANTIGEN: Hepatitis B Surface Ag: NEGATIVE

## 2014-06-19 LAB — HEPATITIS B SURFACE ANTIBODY,QUALITATIVE: Hep B S Ab: NEGATIVE

## 2014-06-19 NOTE — Progress Notes (Signed)
I remove his bandage over the port site. His port look well healed without signs of infection. Bone Marrow Biopsy and Aspiration Procedure Note   Informed consent was obtained and potential risks including bleeding, infection and pain were reviewed with the patient. The patient's name, date of birth, identification, consent and allergies were verified prior to the start of procedure and time out was performed.  The right posterior iliac crest was chosen as the site of biopsy.  The skin was prepped with Betadine solution.   8 cc of 1% lidocaine was used to provide local anaesthesia.   No bone marrow aspirate was obtained. 2 X 1 inch biopsy were obtained.   The procedure was tolerated well and there were no complications.  The patient was stable at the end of the procedure.  Specimens sent for flow cytometry, cytogenetics and additional studies.

## 2014-06-19 NOTE — Patient Instructions (Signed)
Bone Marrow Aspiration, Bone Marrow Biopsy  Care After  Read the instructions outlined below and refer to this sheet in the next few weeks. These discharge instructions provide you with general information on caring for yourself after you leave the hospital. Your caregiver may also give you specific instructions. While your treatment has been planned according to the most current medical practices available, unavoidable complications occasionally occur. If you have any problems or questions after discharge, call your caregiver.  FINDING OUT THE RESULTS OF YOUR TEST  Not all test results are available during your visit. If your test results are not back during the visit, make an appointment with your caregiver to find out the results. Do not assume everything is normal if you have not heard from your caregiver or the medical facility. It is important for you to follow up on all of your test results.   HOME CARE INSTRUCTIONS   You have had sedation and may be sleepy or dizzy. Your thinking may not be as clear as usual. For the next 24 hours:  · Only take over-the-counter or prescription medicines for pain, discomfort, and or fever as directed by your caregiver.  · Do not drink alcohol.  · Do not smoke.  · Do not drive.  · Do not make important legal decisions.  · Do not operate heavy machinery.  · Do not care for small children by yourself.  · Keep your dressing clean and dry. You may replace dressing with a bandage after 24 hours.  · You may take a bath or shower after 24 hours.  · Use an ice pack for 20 minutes every 2 hours while awake for pain as needed.  SEEK MEDICAL CARE IF:   · There is redness, swelling, or increasing pain at the biopsy site.  · There is pus coming from the biopsy site.  · There is drainage from a biopsy site lasting longer than one day.  · An unexplained oral temperature above 102° F (38.9° C) develops.  SEEK IMMEDIATE MEDICAL CARE IF:   · You develop a rash.  · You have difficulty  breathing.  · You develop any reaction or side effects to medications given.  Document Released: 03/25/2005 Document Revised: 11/28/2011 Document Reviewed: 09/02/2008  ExitCare® Patient Information ©2015 [employer]. This information is not intended to replace advice given to you by your health care provider. Make sure you discuss any questions you have with your health care provider.

## 2014-06-19 NOTE — Progress Notes (Signed)
Patient discharged home with no complaints; vital signs stable; ambulates well at discharge; consent signed and patient states that he and family member understand discharge instructions given by Dr. Alvy Bimler.

## 2014-06-20 ENCOUNTER — Other Ambulatory Visit: Payer: Self-pay | Admitting: *Deleted

## 2014-06-20 ENCOUNTER — Other Ambulatory Visit: Payer: Self-pay | Admitting: Hematology and Oncology

## 2014-06-20 DIAGNOSIS — C9110 Chronic lymphocytic leukemia of B-cell type not having achieved remission: Secondary | ICD-10-CM

## 2014-06-20 MED ORDER — LIDOCAINE-PRILOCAINE 2.5-2.5 % EX CREA: 1.0000 "application " | TOPICAL_CREAM | CUTANEOUS | Status: DC | PRN

## 2014-06-20 MED ORDER — SULFAMETHOXAZOLE-TMP DS 800-160 MG PO TABS: 1.0000 | ORAL_TABLET | ORAL | Status: DC

## 2014-06-20 MED ORDER — CHLORAMBUCIL 2 MG PO TABS: 0.1000 mg/kg | ORAL_TABLET | ORAL | Status: DC

## 2014-06-20 MED ORDER — ACYCLOVIR 400 MG PO TABS: 400.0000 mg | ORAL_TABLET | Freq: Every day | ORAL | Status: DC

## 2014-06-20 MED ORDER — ALLOPURINOL 300 MG PO TABS: 300.0000 mg | ORAL_TABLET | Freq: Every day | ORAL | Status: DC

## 2014-06-20 NOTE — Telephone Encounter (Signed)
Bactrim, Allopurinol, acyclovir and EMLA called to [facility]. Leukeran called to [facility]. Per patient request

## 2014-06-23 ENCOUNTER — Other Ambulatory Visit: Payer: Self-pay | Admitting: Hematology and Oncology

## 2014-06-23 ENCOUNTER — Telehealth: Payer: Self-pay | Admitting: Hematology and Oncology

## 2014-06-23 ENCOUNTER — Other Ambulatory Visit (HOSPITAL_BASED_OUTPATIENT_CLINIC_OR_DEPARTMENT_OTHER): Payer: Medicare Other

## 2014-06-23 ENCOUNTER — Ambulatory Visit (HOSPITAL_BASED_OUTPATIENT_CLINIC_OR_DEPARTMENT_OTHER): Payer: Medicare Other | Admitting: Hematology and Oncology

## 2014-06-23 VITALS — BP 158/57 | HR 68 | Temp 97.8°F | Resp 18 | Ht 76.0 in | Wt 294.2 lb

## 2014-06-23 DIAGNOSIS — D630 Anemia in neoplastic disease: Secondary | ICD-10-CM

## 2014-06-23 DIAGNOSIS — C9110 Chronic lymphocytic leukemia of B-cell type not having achieved remission: Secondary | ICD-10-CM

## 2014-06-23 DIAGNOSIS — Z9189 Other specified personal risk factors, not elsewhere classified: Secondary | ICD-10-CM

## 2014-06-23 LAB — CBC WITH DIFFERENTIAL/PLATELET
BASO%: 0.4 % (ref 0.0–2.0)
Basophils Absolute: 0.2 10*3/uL — ABNORMAL HIGH (ref 0.0–0.1)
EOS%: 0.3 % (ref 0.0–7.0)
Eosinophils Absolute: 0.1 10*3/uL (ref 0.0–0.5)
HCT: 27.4 % — ABNORMAL LOW (ref 38.4–49.9)
HGB: 8.3 g/dL — ABNORMAL LOW (ref 13.0–17.1)
LYMPH%: 87 % — ABNORMAL HIGH (ref 14.0–49.0)
MCH: 25.2 pg — ABNORMAL LOW (ref 27.2–33.4)
MCHC: 30.4 g/dL — ABNORMAL LOW (ref 32.0–36.0)
MCV: 83 fL (ref 79.3–98.0)
MONO#: 0.8 10*3/uL (ref 0.1–0.9)
MONO%: 1.5 % (ref 0.0–14.0)
NEUT#: 5.3 10*3/uL (ref 1.5–6.5)
NEUT%: 10.8 % — ABNORMAL LOW (ref 39.0–75.0)
Platelets: 183 10*3/uL (ref 140–400)
RBC: 3.3 10*6/uL — ABNORMAL LOW (ref 4.20–5.82)
RDW: 18.1 % — ABNORMAL HIGH (ref 11.0–14.6)
WBC: 49.3 10*3/uL — ABNORMAL HIGH (ref 4.0–10.3)
lymph#: 43 10*3/uL — ABNORMAL HIGH (ref 0.9–3.3)

## 2014-06-23 LAB — TECHNOLOGIST REVIEW

## 2014-06-23 LAB — HOLD TUBE, BLOOD BANK

## 2014-06-23 MED ORDER — PREDNISONE 50 MG PO TABS: ORAL_TABLET | ORAL | Status: DC

## 2014-06-23 NOTE — Telephone Encounter (Signed)
gv pt appt schedule for oct. dates for chemo and labs scheduled per 10/5 pof.

## 2014-06-23 NOTE — Progress Notes (Signed)
[place] OFFICE PROGRESS NOTE  Patient Care Team: Wenda Low, MD as PCP - General (Internal Medicine) [name], MD as Attending Physician (Cardiology)  SUMMARY OF ONCOLOGIC HISTORY:   CLL (chronic lymphocytic leukemia)   09/10/2013 Initial Diagnosis CLL (chronic lymphocytic leukemia)   12/26/2013 Pathology Results FISH analysis was normal.   06/17/2014 Procedure The patient has placement of Infuse-a-Port.   06/18/2014 Imaging CT scan of the chest, abdomen and pelvis showed diffuse lymphadenopathy and splenomegaly.   06/19/2014 Bone Marrow Biopsy Bone marrow aspirate and biopsy show CLL.    INTERVAL HISTORY: Please see below for problem oriented charting. He returns today to review test results. He complained of fatigue.  REVIEW OF SYSTEMS:   Constitutional: Denies fevers, chills or abnormal weight loss Eyes: Denies blurriness of vision Ears, nose, mouth, throat, and face: Denies mucositis or sore throat Respiratory: Denies cough, dyspnea or wheezes Cardiovascular: Denies palpitation, chest discomfort or lower extremity swelling Gastrointestinal:  Denies nausea, heartburn or change in bowel habits Skin: Denies abnormal skin rashes Lymphatics: Denies new lymphadenopathy or easy bruising Neurological:Denies numbness, tingling or new weaknesses Behavioral/Psych: Mood is stable, no new changes  All other systems were reviewed with the patient and are negative.  I have reviewed the past medical history, past surgical history, social history and family history with the patient and they are unchanged from previous note.  ALLERGIES:  has No Known Allergies.  MEDICATIONS:  Current Outpatient Prescriptions  Medication Sig Dispense Refill  . aspirin 81 MG tablet Take 81 mg by mouth daily. Every other day      . atorvastatin (LIPITOR) 40 MG tablet Take 40 mg by mouth daily.      . fish oil-omega-3 fatty acids 1000 MG capsule Take 2 g by mouth daily.       . isosorbide  mononitrate (IMDUR) 60 MG 24 hr tablet Take 1 tablet (60 mg total) by mouth daily.  30 tablet  12  . lidocaine-prilocaine (EMLA) cream Apply 1 application topically as needed. Apply to port 1 hour prior to chemo  30 g  1  . lisinopril-hydrochlorothiazide (PRINZIDE,ZESTORETIC) 10-12.5 MG per tablet Take 1 tablet by mouth daily.      . metFORMIN (GLUCOPHAGE) 500 MG tablet Take 1,000 mg by mouth 2 (two) times daily with a meal.       . metoprolol succinate (TOPROL-XL) 50 MG 24 hr tablet Take 1 tablet (50 mg total) by mouth daily.  90 tablet  3  . acyclovir (ZOVIRAX) 400 MG tablet Take 1 tablet (400 mg total) by mouth daily.  30 tablet  5  . allopurinol (ZYLOPRIM) 300 MG tablet Take 1 tablet (300 mg total) by mouth daily.  30 tablet  0  . chlorambucil (LEUKERAN) 2 MG tablet Take 7 tablets (14 mg total) by mouth every 14 (fourteen) days. Take on days 1 and 15 of chemotherapy. Give on an empty stomach 1 hour before or 2 hours after meals.  14 tablet  5  . predniSONE (DELTASONE) 50 MG tablet Take 1 tab today and tomorrow  2 tablet  0  . sulfamethoxazole-trimethoprim (BACTRIM DS) 800-160 MG per tablet Take 1 tablet by mouth 3 (three) times a week. On Mondays, Wednesdays and Fridays  12 tablet  5   No current facility-administered medications for this visit.    PHYSICAL EXAMINATION: ECOG PERFORMANCE STATUS: 1 - Symptomatic but completely ambulatory  Filed Vitals:   06/23/14 0847  BP: 158/57  Pulse: 68  Temp: 97.8 F (  36.6 C)  Resp: 18   Filed Weights   06/23/14 0847  Weight: 294 lb 3.2 oz (133.448 kg)    GENERAL:alert, no distress and comfortable SKIN: skin color, texture, turgor are normal, no rashes or significant lesions EYES: normal, Conjunctiva are pink and non-injected, sclera clear Musculoskeletal:no cyanosis of digits and no clubbing  NEURO: alert & oriented x 3 with fluent speech, no focal motor/sensory deficits  LABORATORY DATA:  I have reviewed the data as listed    Component  Value Date/Time   NA 138 06/19/2014 1010   K 3.6 06/19/2014 1010   CL 101 03/11/2013 1031   CO2 25 06/19/2014 1010   GLUCOSE 160* 06/19/2014 1010   GLUCOSE 173* 03/11/2013 1031   GLUCOSE 128* 01/17/2013 1040   BUN 15.1 06/19/2014 1010   CREATININE 1.0 06/19/2014 1010   CALCIUM 9.5 06/19/2014 1010   PROT 7.3 06/19/2014 1010   ALBUMIN 3.1* 06/19/2014 1010   AST 11 06/19/2014 1010   ALT 7 06/19/2014 1010   ALKPHOS 61 06/19/2014 1010   BILITOT 0.91 06/19/2014 1010    No results found for this basename: SPEP, UPEP,  kappa and lambda light chains    Lab Results  Component Value Date   WBC 49.3* 06/23/2014   NEUTROABS 5.3 06/23/2014   HGB 8.3* 06/23/2014   HCT 27.4* 06/23/2014   MCV 83.0 06/23/2014   PLT 183 06/23/2014      Chemistry      Component Value Date/Time   NA 138 06/19/2014 1010   K 3.6 06/19/2014 1010   CL 101 03/11/2013 1031   CO2 25 06/19/2014 1010   BUN 15.1 06/19/2014 1010   CREATININE 1.0 06/19/2014 1010      Component Value Date/Time   CALCIUM 9.5 06/19/2014 1010   ALKPHOS 61 06/19/2014 1010   AST 11 06/19/2014 1010   ALT 7 06/19/2014 1010   BILITOT 0.91 06/19/2014 1010     ASSESSMENT & PLAN:  CLL (chronic lymphocytic leukemia) The decision was made based on publication at the [facility]. It is a category 1 recommendation from [employer]CC[employer]. Nelson Chimes, M.D., Johna Sheriff, M.D., Rubbie Battiest, M.S., Syliva Overman, M.D., Buckner Malta, M.D., Lana Fish. Roderick Pee, M.D., Bradd Burner, M.D., John Giovanni, M.D., Cecil Cobbs, M.D., Lina Sar, M.D., Annie Main Opat, M.D., Derenda Mis. Roxy Manns, M.D., Kathyrn Drown, M.D., Boyce Medici, M.D., Lequita Asal, M.D., Viona Gilmore, M.D., Beryle Beams, Ph.D., Ree Kida, M.D., Sheralyn Boatman, M.D., Tyrone Apple, M.[place]., Katherine Roan, B.[place] Sharen Heck, M.D., and Bonney Leitz, M.D. Alison Stalling J Med 2014; [id number] 20, 2014DOI: 10.1056/[id number]  The chemotherapy consists of obinutuzumab &  chlorambucil.  1. Chlorambucil was administered orally at a dose of 0.5 mg per kilogram of body weight on days 1 and 15 of each cycle  2. Obinutuzumab was administered intravenously at a dose of 1000 mg on days 1, 8, and 15 of cycle 1 and on day 1 of cycles 2 through 6  3. Prophylaxis for infusion-related reactions and the tumor lysis syndrome included fluid intake and premedication with allopurinol, acetaminophen, antihistamines, and glucocorticoids. 4. Antimicrobial prophylaxis with acyclovir and Bactrim.  The combination chemotherapy yield an average progression free survival approximately 26.7 months and response rates approximately 21%.  Some of the short term side-effects included, though not limited to, risk of fatigue, weight loss, tumor lysis syndrome, risk of allergic reactions, pancytopenia, life-threatening infections, need for transfusions of blood products, nausea, vomiting, change in bowel habits, risk of congestive heart failure, admission to  hospital for various reasons, and risks of death.   Long term side-effects are also discussed including permanent damage to nerve function, chronic fatigue, and rare secondary malignancy including bone marrow disorders.   The patient is aware that the response rates discussed earlier is not guaranteed.    After a long discussion, patient made an informed decision to proceed with the prescribed plan of care.    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 At present time, he does not require blood transfusion. We'll monitor his blood counts carefully. We will proceed to work him up with a plan to start treatment in the near future. I will give him one unit of blood if his hemoglobin dropped to less than 8 g. The patient would need irradiated blood products.   At high risk of tumor lysis syndrome I will start him on allopurinol.   Orders Placed This Encounter  Procedures  . CBC with Differential    Standing  Status: Future     Number of Occurrences: 1     Standing Expiration Date: 06/23/2015  . CBC & Diff and Retic    Standing Status: Standing     Number of Occurrences: 11     Standing Expiration Date: 06/24/2015  . Lactate dehydrogenase    Standing Status: Standing     Number of Occurrences: 9     Standing Expiration Date: 06/24/2015  . Comprehensive metabolic panel    Standing Status: Standing     Number of Occurrences: 3     Standing Expiration Date: 06/24/2015  . Uric Acid    Standing Status: Standing     Number of Occurrences: 9     Standing Expiration Date: 06/24/2015  . Hold Tube, Blood Bank    Standing Status: Future     Number of Occurrences: 1     Standing Expiration Date: 06/23/2015  . Hold Tube, Blood Bank    Standing Status: Standing     Number of Occurrences: 9     Standing Expiration Date: 06/24/2015   All questions were answered. The patient knows to call the clinic with any problems, questions or concerns. No barriers to learning was detected. I spent 30 minutes counseling the patient face to face. The total time spent in the appointment was 40 minutes and more than 50% was on counseling and review of test results     , , MD 06/23/2014 9:23 AM    

## 2014-06-23 NOTE — Assessment & Plan Note (Signed)
This is due to his underlying disease. At present time, he does not require blood transfusion. We'll monitor his blood counts carefully. We will proceed to work him up with a plan to start treatment in the near future. I will give him one unit of blood if his hemoglobin dropped to less than 8 g. The patient would need irradiated blood products.

## 2014-06-23 NOTE — Assessment & Plan Note (Signed)
The decision was made based on publication at the [facility]. It is a category 1 recommendation from NCCN. Nelson Chimes, M.D., Johna Sheriff, M.D., Rubbie Battiest, M.S., Syliva Overman, M.D., Buckner Malta, M.D., Lana Fish. Roderick Pee, M.D., Bradd Burner, M.D., John Giovanni, M.D., Cecil Cobbs, M.D., Lina Sar, M.D., Annie Main Opat, M.D., Derenda Mis. Roxy Manns, M.D., Kathyrn Drown, M.D., Boyce Medici, M.D., Lequita Asal, M.D., Viona Gilmore, M.D., Beryle Beams, Ph.D., Ree Kida, M.D., Sheralyn Boatman, M.D., Tyrone Apple, M.[place]., Katherine Roan, B.[place]., Sharen Heck, M.D., and Bonney Leitz, M.D. Alison Stalling J Med 2014; [id number] 20, 2014DOI: 10.1056/[id number]  The chemotherapy consists of obinutuzumab & chlorambucil.  1. Chlorambucil was administered orally at a dose of 0.5 mg per kilogram of body weight on days 1 and 15 of each cycle  2. Obinutuzumab was administered intravenously at a dose of 1000 mg on days 1, 8, and 15 of cycle 1 and on day 1 of cycles 2 through 6  3. Prophylaxis for infusion-related reactions and the tumor lysis syndrome included fluid intake and premedication with allopurinol, acetaminophen, antihistamines, and glucocorticoids. 4. Antimicrobial prophylaxis with acyclovir and Bactrim.  The combination chemotherapy yield an average progression free survival approximately 26.7 months and response rates approximately 21%.  Some of the short term side-effects included, though not limited to, risk of fatigue, weight loss, tumor lysis syndrome, risk of allergic reactions, pancytopenia, life-threatening infections, need for transfusions of blood products, nausea, vomiting, change in bowel habits, risk of congestive heart failure, admission to hospital for various reasons, and risks of death.   Long term side-effects are also discussed including permanent damage to nerve function, chronic fatigue, and rare secondary malignancy including  bone marrow disorders.   The patient is aware that the response rates discussed earlier is not guaranteed.    After a long discussion, patient made an informed decision to proceed with the prescribed plan of care.

## 2014-06-23 NOTE — Assessment & Plan Note (Signed)
I will start him on allopurinol.

## 2014-06-24 ENCOUNTER — Ambulatory Visit (HOSPITAL_BASED_OUTPATIENT_CLINIC_OR_DEPARTMENT_OTHER): Payer: Medicare Other

## 2014-06-24 ENCOUNTER — Other Ambulatory Visit: Payer: Self-pay | Admitting: Hematology and Oncology

## 2014-06-24 ENCOUNTER — Ambulatory Visit (HOSPITAL_BASED_OUTPATIENT_CLINIC_OR_DEPARTMENT_OTHER): Payer: Medicare Other | Admitting: Nurse Practitioner

## 2014-06-24 VITALS — BP 153/67 | HR 77 | Temp 97.8°F | Resp 24

## 2014-06-24 DIAGNOSIS — Z5112 Encounter for antineoplastic immunotherapy: Secondary | ICD-10-CM

## 2014-06-24 DIAGNOSIS — C9110 Chronic lymphocytic leukemia of B-cell type not having achieved remission: Secondary | ICD-10-CM | POA: Diagnosis not present

## 2014-06-24 DIAGNOSIS — T7840XA Allergy, unspecified, initial encounter: Secondary | ICD-10-CM

## 2014-06-24 MED ORDER — MEPERIDINE HCL 25 MG/ML IJ SOLN
INTRAMUSCULAR | Status: AC
  Filled 2014-06-24: qty 1

## 2014-06-24 MED ORDER — FAMOTIDINE IN NACL 20-0.9 MG/50ML-% IV SOLN
20.0000 mg | Freq: Once | INTRAVENOUS | Status: AC | PRN
Start: 2014-06-24 — End: 2014-06-24
  Administered 2014-06-24: 20 mg via INTRAVENOUS

## 2014-06-24 MED ORDER — MEPERIDINE HCL 25 MG/ML IJ SOLN
12.5000 mg | Freq: Once | INTRAMUSCULAR | Status: AC
  Administered 2014-06-24: 12.5 mg via INTRAVENOUS

## 2014-06-24 MED ORDER — ACETAMINOPHEN 325 MG PO TABS
650.0000 mg | ORAL_TABLET | Freq: Once | ORAL | Status: AC
  Administered 2014-06-24: 650 mg via ORAL

## 2014-06-24 MED ORDER — DEXAMETHASONE SODIUM PHOSPHATE 20 MG/5ML IJ SOLN
INTRAMUSCULAR | Status: AC
  Filled 2014-06-24: qty 5

## 2014-06-24 MED ORDER — DIPHENHYDRAMINE HCL 50 MG/ML IJ SOLN
25.0000 mg | Freq: Once | INTRAMUSCULAR | Status: AC | PRN
  Administered 2014-06-24: 25 mg via INTRAVENOUS

## 2014-06-24 MED ORDER — SODIUM CHLORIDE 0.9 % IJ SOLN
10.0000 mL | INTRAMUSCULAR | Status: DC | PRN
  Administered 2014-06-24: 10 mL
  Filled 2014-06-24: qty 10

## 2014-06-24 MED ORDER — HEPARIN SOD (PORK) LOCK FLUSH 100 UNIT/ML IV SOLN
500.0000 [IU] | Freq: Once | INTRAVENOUS | Status: AC | PRN
  Administered 2014-06-24: 500 [IU]
  Filled 2014-06-24: qty 5

## 2014-06-24 MED ORDER — ACETAMINOPHEN 325 MG PO TABS
ORAL_TABLET | ORAL | Status: AC
  Filled 2014-06-24: qty 2

## 2014-06-24 MED ORDER — SODIUM CHLORIDE 0.9 % IV SOLN
Freq: Once | INTRAVENOUS | Status: AC
  Administered 2014-06-24: 10:00:00 via INTRAVENOUS

## 2014-06-24 MED ORDER — DIPHENHYDRAMINE HCL 50 MG/ML IJ SOLN
50.0000 mg | Freq: Once | INTRAMUSCULAR | Status: AC
  Administered 2014-06-24: 50 mg via INTRAVENOUS

## 2014-06-24 MED ORDER — DEXAMETHASONE SODIUM PHOSPHATE 20 MG/5ML IJ SOLN
20.0000 mg | Freq: Once | INTRAMUSCULAR | Status: AC
  Administered 2014-06-24: 20 mg via INTRAVENOUS

## 2014-06-24 MED ORDER — METHYLPREDNISOLONE SODIUM SUCC 125 MG IJ SOLR
125.0000 mg | Freq: Once | INTRAMUSCULAR | Status: AC | PRN
  Administered 2014-06-24: 125 mg via INTRAVENOUS

## 2014-06-24 MED ORDER — SODIUM CHLORIDE 0.9 % IV SOLN
Freq: Once | INTRAVENOUS | Status: AC | PRN
  Administered 2014-06-24: 12:00:00 via INTRAVENOUS

## 2014-06-24 MED ORDER — SODIUM CHLORIDE 0.9 % IV SOLN
100.0000 mg | Freq: Once | INTRAVENOUS | Status: AC
  Administered 2014-06-24: 100 mg via INTRAVENOUS
  Filled 2014-06-24: qty 4

## 2014-06-24 MED ORDER — DIPHENHYDRAMINE HCL 50 MG/ML IJ SOLN
INTRAMUSCULAR | Status: AC
  Filled 2014-06-24: qty 1

## 2014-06-24 NOTE — Progress Notes (Signed)
[place]   Chief Complaint  Patient presents with  . Hypersensitivity Reaction    HPI: Derek [name] 73 y.o. male diagnosed with chronic lymphocytic leukemia.  Here today to initiate obinutuzumab /chlorambucil chemotherapy regimen.  Patient had just started his first cycle of obinutuzumab chemotherapy infusion; and developed some right worse.  He denied any chest pain, chest pressure, or shortness of breath.  He denied any issues with swallowing or his airway.  He denied any nausea or vomiting.  Vital signs remained stable.  Patient did confirm that he had taken the prednisone 50 mg orally on both the day before and the day of his chemotherapy.  HPI  ROS  Past Medical History  Diagnosis Date  . Lymphocytosis   . Hypertension   . Obesity   . Hypertriglyceridemia   . 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 Diabetes mellitus, type II     Past Surgical History  Procedure Laterality Date  . Foot surgery Right   . Vericose vein stripping    . Nasal sinus surgery      has Abnormal cardiovascular function study; Hypertension; Diabetes mellitus, type II; Obesity; Hypertriglyceridemia; Coronary atherosclerosis of native coronary artery; CLL (chronic lymphocytic leukemia); Anemia in neoplastic disease; Weight loss, non-intentional; At high risk of tumor lysis syndrome; and Hypersensitivity reaction on his problem list.     has No Known Allergies.    Medication List       This list is accurate as of: 06/24/14 11:59 PM.  Always use your most recent med list.               acyclovir 400 MG tablet  Commonly known as:  ZOVIRAX  Take 1 tablet (400 mg total) by mouth daily.     allopurinol 300 MG tablet  Commonly known as:  ZYLOPRIM  Take 1 tablet (300 mg total) by mouth daily.     aspirin 81 MG tablet  Take 81 mg by mouth daily. Every other day     atorvastatin 40 MG tablet  Commonly known as:  LIPITOR  Take 40 mg by mouth daily.     chlorambucil 2 MG tablet  Commonly known as:  LEUKERAN  Take 7 tablets (14 mg total) by mouth every 14 (fourteen) days. Take on days 1 and 15 of chemotherapy. Give on an empty stomach 1 hour before or 2 hours after meals.     fish oil-omega-3 fatty acids 1000 MG capsule  Take 2 g by mouth daily.     isosorbide mononitrate 60 MG 24 hr tablet  Commonly known as:  IMDUR  Take 1 tablet (60 mg total) by mouth daily.     lidocaine-prilocaine cream  Commonly known as:  EMLA  Apply 1 application topically as needed. Apply to port 1 hour prior to chemo     lisinopril-hydrochlorothiazide 10-12.5 MG per tablet  Commonly known as:  PRINZIDE,ZESTORETIC  Take 1 tablet by mouth daily.     metFORMIN 500 MG tablet  Commonly known as:  GLUCOPHAGE  Take 1,000 mg by mouth 2 (two) times daily with a meal.     metoprolol succinate 50 MG 24 hr tablet  Commonly known as:  TOPROL-XL  Take 1 tablet (50 mg total) by mouth daily.     predniSONE 50 MG tablet  Commonly known as:  DELTASONE  Take 1 tab today and tomorrow     sulfamethoxazole-trimethoprim 800-160 MG per tablet  Commonly known as:  BACTRIM DS  Take 1 tablet by mouth 3 (three) times a week. On Mondays, Wednesdays and Fridays         PHYSICAL EXAMINATION  Vitals: BP 149/60, HR 77, temp 97.8  Physical Exam  Nursing note and vitals reviewed. Constitutional: He is oriented to person, place, and time and well-developed, well-nourished, and in no distress.  HENT:  Head: Normocephalic.  Eyes: Conjunctivae are normal. Pupils are equal, round, and reactive to light. No scleral icterus.  Neck: Normal range of motion.  Cardiovascular: Normal rate, regular rhythm, normal heart sounds and intact distal pulses.   Pulmonary/Chest: Effort normal and breath sounds normal. No respiratory distress. He has no wheezes.  Abdominal: Soft. Bowel sounds are normal. He exhibits no distension. There is no tenderness. There is no rebound.  Musculoskeletal:  Normal range of motion. He exhibits no edema and no tenderness.  Neurological: He is alert and oriented to person, place, and time. Gait normal.  Skin: Skin is warm and dry. No rash noted. No erythema.  Psychiatric: Affect normal.    LABORATORY DATA:. CBC  Lab Results  Component Value Date   WBC 23.5* 06/25/2014   RBC 3.02* 06/25/2014   HGB 7.8* 06/25/2014   HCT 25.2* 06/25/2014   PLT 137* 06/25/2014   MCV 83.4 06/25/2014   MCH 25.8* 06/25/2014   MCHC 31.0* 06/25/2014   RDW 17.0* 06/25/2014   LYMPHSABS 9.9* 06/25/2014   MONOABS 1.5* 06/25/2014   EOSABS 0.0 06/25/2014   BASOSABS 0.0 06/25/2014     CMET  Lab Results  Component Value Date   NA 138 06/25/2014   K 4.0 06/25/2014   CL 101 03/11/2013   CO2 21* 06/25/2014   GLUCOSE 140 06/25/2014   BUN 31.0* 06/25/2014   CREATININE 1.2 06/25/2014   CALCIUM 9.1 06/25/2014   PROT 7.3 06/25/2014   ALBUMIN 3.0* 06/25/2014   AST 13 06/25/2014   ALT 7 06/25/2014   ALKPHOS 61 06/25/2014   BILITOT 0.95 06/25/2014    ASSESSMENT/PLAN:    CLL (chronic lymphocytic leukemia)  Assessment & Plan Patient will continue with his obinutuzumab /chlorambucil chemotherapy regimen.  He will receive the obinutuzumab on days 1, 8, and 15 of his first cycle; and then will receive the obinutuzumab on day 1 only of cycles 2 through 6.  Patient will continue with oral chlorambucil as previously directed on days #1 and 15 of each cycle.  Patient was given prednisone 50 mg orally at to take on the day prior and day off his chemotherapy.  Patient has plans to return on 07/01/2014 for labs and chemotherapy as well.   Hypersensitivity reaction  Assessment & Plan Patient did develop some moderate rigors with his first dose of obinutuzumab today.  Patient states he did take the oral prednisone as previously directed.  Patient was given Benadryl 25 mg IV, Pepcid 20 mg IV, Solu-Medrol 125 mg IV, and Demerol 12.5 mg IV which did resolve all hypersensitivity reaction symptoms.   Patient was able to complete all of his chemotherapy today.    Patient stated understanding of all instructions; and was in agreement with this plan of care. The patient knows to call the clinic with any problems, questions or concerns.   Review/collaboration with Dr. Alvy Bimler regarding all aspects of patient's visit today.   Total time spent with patient was 25 minutes;  with greater than 80 percent of that time spent in face to face counseling regarding his symptoms, frequent monitoring while in the infusion area, and coordination of care  and follow up.  Disclaimer: This note was dictated with voice recognition software. Similar sounding words can inadvertently be transcribed and may not be corrected upon review.   Drue Second, NP 06/26/2014

## 2014-06-24 NOTE — Patient Instructions (Signed)
[place] Discharge Instructions for Patients Receiving Chemotherapy  Today you received the following chemotherapy agent: Obinutuzumab   If you develop nausea and vomiting that is not controlled by your nausea medication, call the clinic. This particular treatment is not known to cause nausea  BELOW ARE SYMPTOMS THAT SHOULD BE REPORTED IMMEDIATELY:  *FEVER GREATER THAN 100.5 F  *CHILLS WITH OR WITHOUT FEVER  NAUSEA AND VOMITING THAT IS NOT CONTROLLED WITH YOUR NAUSEA MEDICATION  *UNUSUAL SHORTNESS OF BREATH  *UNUSUAL BRUISING OR BLEEDING  TENDERNESS IN MOUTH AND THROAT WITH OR WITHOUT PRESENCE OF ULCERS  *URINARY PROBLEMS  *BOWEL PROBLEMS  UNUSUAL RASH Items with * indicate a potential emergency and should be followed up as soon as possible.  Feel free to call the clinic should you have any questions or concerns. The clinic phone number is [phone].  It has been a pleasure to serve you today1

## 2014-06-24 NOTE — Progress Notes (Signed)
@  1222-noted patient have rigors. No pain or nausea or dyspnea. Stopped obinutuzumab and stared NS wide open. Covered with warm blankets. Started hypersensitivity protocol medications. Charge nurse notified Selena Lesser, NP.  @1225 --Selena Lesser, NP at bedside. Orders given for Demerol 12.5 mg IV X 1 and follow this with Pepcid. @1256 -Resting quietly with no rigors or other complaints. NS continues. @1315 -Called wife, Elois and made her aware of reaction and that he is doing well now. Family does need to arrange for someone to pick him up today-has had too many medications to drive. @1321 -Restart Obinutuzumab per Dr. Alvy Bimler

## 2014-06-25 ENCOUNTER — Other Ambulatory Visit: Payer: Self-pay | Admitting: Hematology and Oncology

## 2014-06-25 ENCOUNTER — Ambulatory Visit (HOSPITAL_COMMUNITY)
Admission: RE | Admit: 2014-06-25 | Discharge: 2014-06-25 | Disposition: A | Discharge status: Home / Self Care | Payer: Medicare Other | Source: Ambulatory Visit | Attending: Hematology and Oncology | Admitting: Hematology and Oncology

## 2014-06-25 ENCOUNTER — Ambulatory Visit: Payer: Medicare Other

## 2014-06-25 ENCOUNTER — Other Ambulatory Visit (HOSPITAL_BASED_OUTPATIENT_CLINIC_OR_DEPARTMENT_OTHER): Payer: Medicare Other

## 2014-06-25 ENCOUNTER — Other Ambulatory Visit: Payer: Medicare Other

## 2014-06-25 ENCOUNTER — Ambulatory Visit (HOSPITAL_BASED_OUTPATIENT_CLINIC_OR_DEPARTMENT_OTHER): Payer: Medicare Other

## 2014-06-25 VITALS — BP 150/65 | HR 60 | Temp 98.0°F | Resp 20

## 2014-06-25 DIAGNOSIS — D630 Anemia in neoplastic disease: Secondary | ICD-10-CM | POA: Diagnosis not present

## 2014-06-25 DIAGNOSIS — C9110 Chronic lymphocytic leukemia of B-cell type not having achieved remission: Secondary | ICD-10-CM

## 2014-06-25 DIAGNOSIS — Z5112 Encounter for antineoplastic immunotherapy: Secondary | ICD-10-CM | POA: Diagnosis not present

## 2014-06-25 DIAGNOSIS — T7840XA Allergy, unspecified, initial encounter: Secondary | ICD-10-CM | POA: Insufficient documentation

## 2014-06-25 LAB — CBC & DIFF AND RETIC
BASO%: 0 % (ref 0.0–2.0)
Basophils Absolute: 0 10*3/uL (ref 0.0–0.1)
EOS%: 0 % (ref 0.0–7.0)
Eosinophils Absolute: 0 10*3/uL (ref 0.0–0.5)
HCT: 25.2 % — ABNORMAL LOW (ref 38.4–49.9)
HGB: 7.8 g/dL — ABNORMAL LOW (ref 13.0–17.1)
Immature Retic Fract: 9.9 % (ref 3.00–10.60)
LYMPH%: 42.2 % (ref 14.0–49.0)
MCH: 25.8 pg — ABNORMAL LOW (ref 27.2–33.4)
MCHC: 31 g/dL — ABNORMAL LOW (ref 32.0–36.0)
MCV: 83.4 fL (ref 79.3–98.0)
MONO#: 1.5 10*3/uL — ABNORMAL HIGH (ref 0.1–0.9)
MONO%: 6.6 % (ref 0.0–14.0)
NEUT#: 12 10*3/uL — ABNORMAL HIGH (ref 1.5–6.5)
NEUT%: 51.2 % (ref 39.0–75.0)
Platelets: 137 10*3/uL — ABNORMAL LOW (ref 140–400)
RBC: 3.02 10*6/uL — ABNORMAL LOW (ref 4.20–5.82)
RDW: 17 % — ABNORMAL HIGH (ref 11.0–14.6)
Retic %: 2.58 % — ABNORMAL HIGH (ref 0.80–1.80)
Retic Ct Abs: 77.92 10*3/uL (ref 34.80–93.90)
WBC: 23.5 10*3/uL — ABNORMAL HIGH (ref 4.0–10.3)
lymph#: 9.9 10*3/uL — ABNORMAL HIGH (ref 0.9–3.3)

## 2014-06-25 LAB — COMPREHENSIVE METABOLIC PANEL (CC13)
ALT: 7 U/L (ref 0–55)
AST: 13 U/L (ref 5–34)
Albumin: 3 g/dL — ABNORMAL LOW (ref 3.5–5.0)
Alkaline Phosphatase: 61 U/L (ref 40–150)
Anion Gap: 10 mEq/L (ref 3–11)
BUN: 31 mg/dL — ABNORMAL HIGH (ref 7.0–26.0)
CO2: 21 mEq/L — ABNORMAL LOW (ref 22–29)
Calcium: 9.1 mg/dL (ref 8.4–10.4)
Chloride: 107 mEq/L (ref 98–109)
Creatinine: 1.2 mg/dL (ref 0.7–1.3)
Glucose: 140 mg/dl (ref 70–140)
Potassium: 4 mEq/L (ref 3.5–5.1)
Sodium: 138 mEq/L (ref 136–145)
Total Bilirubin: 0.95 mg/dL (ref 0.20–1.20)
Total Protein: 7.3 g/dL (ref 6.4–8.3)

## 2014-06-25 LAB — TECHNOLOGIST REVIEW

## 2014-06-25 LAB — URIC ACID (CC13): Uric Acid, Serum: 6.8 mg/dl (ref 2.6–7.4)

## 2014-06-25 LAB — LACTATE DEHYDROGENASE (CC13): LDH: 194 U/L (ref 125–245)

## 2014-06-25 LAB — ABO/RH: ABO/RH(D): O POS

## 2014-06-25 LAB — HOLD TUBE, BLOOD BANK

## 2014-06-25 MED ORDER — DIPHENHYDRAMINE HCL 50 MG/ML IJ SOLN
50.0000 mg | Freq: Once | INTRAMUSCULAR | Status: AC
  Administered 2014-06-25: 50 mg via INTRAVENOUS

## 2014-06-25 MED ORDER — DEXAMETHASONE SODIUM PHOSPHATE 20 MG/5ML IJ SOLN
INTRAMUSCULAR | Status: AC
  Filled 2014-06-25: qty 5

## 2014-06-25 MED ORDER — SODIUM CHLORIDE 0.9 % IV SOLN
900.0000 mg | Freq: Once | INTRAVENOUS | Status: AC
  Administered 2014-06-25: 900 mg via INTRAVENOUS
  Filled 2014-06-25: qty 36

## 2014-06-25 MED ORDER — DEXAMETHASONE SODIUM PHOSPHATE 20 MG/5ML IJ SOLN
20.0000 mg | Freq: Once | INTRAMUSCULAR | Status: AC
  Administered 2014-06-25: 20 mg via INTRAVENOUS

## 2014-06-25 MED ORDER — DIPHENHYDRAMINE HCL 50 MG/ML IJ SOLN
INTRAMUSCULAR | Status: AC
  Filled 2014-06-25: qty 1

## 2014-06-25 MED ORDER — SODIUM CHLORIDE 0.9 % IJ SOLN
10.0000 mL | INTRAMUSCULAR | Status: DC | PRN
  Administered 2014-06-25: 10 mL
  Filled 2014-06-25: qty 10

## 2014-06-25 MED ORDER — ACETAMINOPHEN 325 MG PO TABS
650.0000 mg | ORAL_TABLET | Freq: Once | ORAL | Status: AC
  Administered 2014-06-25: 650 mg via ORAL

## 2014-06-25 MED ORDER — SODIUM CHLORIDE 0.9 % IV SOLN
Freq: Once | INTRAVENOUS | Status: AC
  Administered 2014-06-25: 09:00:00 via INTRAVENOUS

## 2014-06-25 MED ORDER — SODIUM CHLORIDE 0.9 % IV SOLN
250.0000 mL | Freq: Once | INTRAVENOUS | Status: AC
  Administered 2014-06-25: 250 mL via INTRAVENOUS

## 2014-06-25 MED ORDER — HEPARIN SOD (PORK) LOCK FLUSH 100 UNIT/ML IV SOLN
500.0000 [IU] | Freq: Once | INTRAVENOUS | Status: AC | PRN
  Administered 2014-06-25: 500 [IU]
  Filled 2014-06-25: qty 5

## 2014-06-25 MED ORDER — ACETAMINOPHEN 325 MG PO TABS
ORAL_TABLET | ORAL | Status: AC
  Filled 2014-06-25: qty 2

## 2014-06-25 NOTE — Progress Notes (Signed)
Per Dr. Alvy Bimler, okay to proceed with treatment today, states no additional pre meds needed before treatment.  MD reviewed labs. We will give patient 1 unit PRBCs either today or tomorrow based on time constraints.  1425: Pt tolerated day 2 of Gazyva treatment without any difficulty. Vital signs stable.

## 2014-06-25 NOTE — Patient Instructions (Addendum)
Blood Transfusion Information WHAT IS A BLOOD TRANSFUSION? A transfusion is the replacement of blood or some of its parts. Blood is made up of multiple cells which provide different functions.  Red blood cells carry oxygen and are used for blood loss replacement.  White blood cells fight against infection.  Platelets control bleeding.  Plasma helps clot blood.  Other blood products are available for specialized needs, such as hemophilia or other clotting disorders. BEFORE THE TRANSFUSION  Who gives blood for transfusions?   You may be able to donate blood to be used at a later date on yourself (autologous donation).  Relatives can be asked to donate blood. This is generally not any safer than if you have received blood from a stranger. The same precautions are taken to ensure safety when a relative's blood is donated.  Healthy volunteers who are fully evaluated to make sure their blood is safe. This is blood bank blood. Transfusion therapy is the safest it has ever been in the practice of medicine. Before blood is taken from a donor, a complete history is taken to make sure that person has no history of diseases nor engages in risky social behavior (examples are intravenous drug use or sexual activity with multiple partners). The donor's travel history is screened to minimize risk of transmitting infections, such as malaria. The donated blood is tested for signs of infectious diseases, such as HIV and hepatitis. The blood is then tested to be sure it is compatible with you in order to minimize the chance of a transfusion reaction. If you or a relative donates blood, this is often done in anticipation of surgery and is not appropriate for emergency situations. It takes many days to process the donated blood. RISKS AND COMPLICATIONS Although transfusion therapy is very safe and saves many lives, the main dangers of transfusion include:   Getting an infectious disease.  Developing a  transfusion reaction. This is an allergic reaction to something in the blood you were given. Every precaution is taken to prevent this. The decision to have a blood transfusion has been considered carefully by your caregiver before blood is given. Blood is not given unless the benefits outweigh the risks. AFTER THE TRANSFUSION  Right after receiving a blood transfusion, you will usually feel much better and more energetic. This is especially true if your red blood cells have gotten low (anemic). The transfusion raises the level of the red blood cells which carry oxygen, and this usually causes an energy increase.  The nurse administering the transfusion will monitor you carefully for complications. HOME CARE INSTRUCTIONS  No special instructions are needed after a transfusion. You may find your energy is better. Speak with your caregiver about any limitations on activity for underlying diseases you may have. SEEK MEDICAL CARE IF:   Your condition is not improving after your transfusion.  You develop redness or irritation at the intravenous (IV) site. SEEK IMMEDIATE MEDICAL CARE IF:  Any of the following symptoms occur over the next 12 hours:  Shaking chills.  You have a temperature by mouth above 102 F (38.9 C), not controlled by medicine.  Chest, back, or muscle pain.  People around you feel you are not acting correctly or are confused.  Shortness of breath or difficulty breathing.  Dizziness and fainting.  You get a rash or develop hives.  You have a decrease in urine output.  Your urine turns a dark color or changes to pink, red, or brown. Any of the following   symptoms occur over the next 10 days:  You have a temperature by mouth above 102 F (38.9 C), not controlled by medicine.  Shortness of breath.  Weakness after normal activity.  The white part of the eye turns yellow (jaundice).  You have a decrease in the amount of urine or are urinating less often.  Your  urine turns a dark color or changes to pink, red, or brown. Document Released: 09/02/2000 Document Revised: 11/28/2011 Document Reviewed: 04/21/2008 [facility] Patient Information 2015 [place], [place]. This information is not intended to replace advice given to you by your health care provider. Make sure you discuss any questions you have with your health care provider.    [place] Discharge Instructions for Patients Receiving Chemotherapy  Today you received the following chemotherapy agents: Gazyva.  To help prevent nausea and vomiting after your treatment, we encourage you to take your nausea medication as prescribed.   If you develop nausea and vomiting that is not controlled by your nausea medication, call the clinic.   BELOW ARE SYMPTOMS THAT SHOULD BE REPORTED IMMEDIATELY:  *FEVER GREATER THAN 100.5 F  *CHILLS WITH OR WITHOUT FEVER  NAUSEA AND VOMITING THAT IS NOT CONTROLLED WITH YOUR NAUSEA MEDICATION  *UNUSUAL SHORTNESS OF BREATH  *UNUSUAL BRUISING OR BLEEDING  TENDERNESS IN MOUTH AND THROAT WITH OR WITHOUT PRESENCE OF ULCERS  *URINARY PROBLEMS  *BOWEL PROBLEMS  UNUSUAL RASH Items with * indicate a potential emergency and should be followed up as soon as possible.  Feel free to call the clinic you have any questions or concerns. The clinic phone number is [phone].

## 2014-06-26 ENCOUNTER — Telehealth: Payer: Self-pay | Admitting: *Deleted

## 2014-06-26 ENCOUNTER — Encounter: Payer: Self-pay | Admitting: Nurse Practitioner

## 2014-06-26 LAB — PREPARE RBC (CROSSMATCH)

## 2014-06-26 NOTE — Assessment & Plan Note (Signed)
Patient will continue with his obinutuzumab /chlorambucil chemotherapy regimen.  He will receive the obinutuzumab on days 1, 8, and 15 of his first cycle; and then will receive the obinutuzumab on day 1 only of cycles 2 through 6.  Patient will continue with oral chlorambucil as previously directed on days #1 and 15 of each cycle.  Patient was given prednisone 50 mg orally at to take on the day prior and day off his chemotherapy.  Patient has plans to return on 07/01/2014 for labs and chemotherapy as well.

## 2014-06-26 NOTE — Telephone Encounter (Signed)
Called Derek [name] for chemotherapy F/U.  Patient is doing well.  Denies n/v.  Denies any new side effects or symptoms.  Bowel and bladder is functioning well.  Eating and drinking well and I instructed to drink 64 oz minimum daily or at least the day before, of and after treatment.  Denies questions at this time and encouraged to call if needed.  Reviewed how to call after hours in the case of an emergency.

## 2014-06-26 NOTE — Assessment & Plan Note (Signed)
Patient did develop some moderate rigors with his first dose of obinutuzumab today.  Patient states he did take the oral prednisone as previously directed.  Patient was given Benadryl 25 mg IV, Pepcid 20 mg IV, Solu-Medrol 125 mg IV, and Demerol 12.5 mg IV which did resolve all hypersensitivity reaction symptoms.  Patient was able to complete all of his chemotherapy today.

## 2014-06-26 NOTE — Telephone Encounter (Signed)
Message copied by Cherylynn Ridges on Thu Jun 26, 2014  4:00 PM ------      Message from: Tania Ade      Created: Tue Jun 24, 2014  5:03 PM      Regarding: chemo follow up call       1st obinutuzumab day 1 10/6, day 2 10/7      Also on leukeran at home      Call on 10/8 ------

## 2014-06-29 LAB — TYPE AND SCREEN
ABO/RH(D): O POS
Antibody Screen: NEGATIVE
Unit division: 0
Unit division: 0

## 2014-07-01 ENCOUNTER — Ambulatory Visit (HOSPITAL_BASED_OUTPATIENT_CLINIC_OR_DEPARTMENT_OTHER): Payer: Medicare Other

## 2014-07-01 ENCOUNTER — Other Ambulatory Visit (HOSPITAL_BASED_OUTPATIENT_CLINIC_OR_DEPARTMENT_OTHER): Payer: Medicare Other

## 2014-07-01 VITALS — BP 130/56 | HR 72 | Temp 98.7°F | Resp 16

## 2014-07-01 DIAGNOSIS — Z5112 Encounter for antineoplastic immunotherapy: Secondary | ICD-10-CM | POA: Diagnosis not present

## 2014-07-01 DIAGNOSIS — C9110 Chronic lymphocytic leukemia of B-cell type not having achieved remission: Secondary | ICD-10-CM | POA: Diagnosis not present

## 2014-07-01 LAB — CBC & DIFF AND RETIC
BASO%: 0.5 % (ref 0.0–2.0)
Basophils Absolute: 0 10*3/uL (ref 0.0–0.1)
EOS%: 1.8 % (ref 0.0–7.0)
Eosinophils Absolute: 0.1 10*3/uL (ref 0.0–0.5)
HCT: 30.6 % — ABNORMAL LOW (ref 38.4–49.9)
HGB: 9.4 g/dL — ABNORMAL LOW (ref 13.0–17.1)
Immature Retic Fract: 8.5 % (ref 3.00–10.60)
LYMPH%: 21.5 % (ref 14.0–49.0)
MCH: 25.3 pg — ABNORMAL LOW (ref 27.2–33.4)
MCHC: 30.7 g/dL — ABNORMAL LOW (ref 32.0–36.0)
MCV: 82.3 fL (ref 79.3–98.0)
MONO#: 0.4 10*3/uL (ref 0.1–0.9)
MONO%: 7.7 % (ref 0.0–14.0)
NEUT#: 3.8 10*3/uL (ref 1.5–6.5)
NEUT%: 68.5 % (ref 39.0–75.0)
Platelets: 186 10*3/uL (ref 140–400)
RBC: 3.72 10*6/uL — ABNORMAL LOW (ref 4.20–5.82)
RDW: 17.4 % — ABNORMAL HIGH (ref 11.0–14.6)
Retic %: 3.01 % — ABNORMAL HIGH (ref 0.80–1.80)
Retic Ct Abs: 111.97 10*3/uL — ABNORMAL HIGH (ref 34.80–93.90)
WBC: 5.6 10*3/uL (ref 4.0–10.3)
lymph#: 1.2 10*3/uL (ref 0.9–3.3)

## 2014-07-01 LAB — HOLD TUBE, BLOOD BANK

## 2014-07-01 LAB — URIC ACID (CC13): Uric Acid, Serum: 6.4 mg/dl (ref 2.6–7.4)

## 2014-07-01 LAB — LACTATE DEHYDROGENASE (CC13): LDH: 156 U/L (ref 125–245)

## 2014-07-01 MED ORDER — DIPHENHYDRAMINE HCL 50 MG/ML IJ SOLN
50.0000 mg | Freq: Once | INTRAMUSCULAR | Status: AC
  Administered 2014-07-01: 50 mg via INTRAVENOUS

## 2014-07-01 MED ORDER — ACETAMINOPHEN 325 MG PO TABS
ORAL_TABLET | ORAL | Status: AC
  Filled 2014-07-01: qty 2

## 2014-07-01 MED ORDER — OBINUTUZUMAB CHEMO INJECTION 1000 MG/40ML
1000.0000 mg | Freq: Once | INTRAVENOUS | Status: AC
  Administered 2014-07-01: 1000 mg via INTRAVENOUS
  Filled 2014-07-01: qty 40

## 2014-07-01 MED ORDER — DIPHENHYDRAMINE HCL 50 MG/ML IJ SOLN
INTRAMUSCULAR | Status: AC
  Filled 2014-07-01: qty 1

## 2014-07-01 MED ORDER — SODIUM CHLORIDE 0.9 % IV SOLN
Freq: Once | INTRAVENOUS | Status: AC
  Administered 2014-07-01: 12:00:00 via INTRAVENOUS

## 2014-07-01 MED ORDER — ACETAMINOPHEN 325 MG PO TABS
650.0000 mg | ORAL_TABLET | Freq: Once | ORAL | Status: AC
  Administered 2014-07-01: 650 mg via ORAL

## 2014-07-01 MED ORDER — SODIUM CHLORIDE 0.9 % IJ SOLN
10.0000 mL | INTRAMUSCULAR | Status: DC | PRN
  Administered 2014-07-01: 10 mL
  Filled 2014-07-01: qty 10

## 2014-07-01 MED ORDER — HEPARIN SOD (PORK) LOCK FLUSH 100 UNIT/ML IV SOLN
500.0000 [IU] | Freq: Once | INTRAVENOUS | Status: AC | PRN
  Administered 2014-07-01: 500 [IU]
  Filled 2014-07-01: qty 5

## 2014-07-01 MED ORDER — DEXAMETHASONE SODIUM PHOSPHATE 20 MG/5ML IJ SOLN
INTRAMUSCULAR | Status: AC
  Filled 2014-07-01: qty 5

## 2014-07-01 MED ORDER — DEXAMETHASONE SODIUM PHOSPHATE 20 MG/5ML IJ SOLN
20.0000 mg | Freq: Once | INTRAMUSCULAR | Status: AC
  Administered 2014-07-01: 20 mg via INTRAVENOUS

## 2014-07-01 NOTE — Patient Instructions (Signed)
[place] Discharge Instructions for Patients Receiving Chemotherapy  Today you received the following chemotherapy agents: Gazyva  To help prevent nausea and vomiting after your treatment, we encourage you to take your nausea medication as prescribed by your physician.    If you develop nausea and vomiting that is not controlled by your nausea medication, call the clinic.   BELOW ARE SYMPTOMS THAT SHOULD BE REPORTED IMMEDIATELY:  *FEVER GREATER THAN 100.5 F  *CHILLS WITH OR WITHOUT FEVER  NAUSEA AND VOMITING THAT IS NOT CONTROLLED WITH YOUR NAUSEA MEDICATION  *UNUSUAL SHORTNESS OF BREATH  *UNUSUAL BRUISING OR BLEEDING  TENDERNESS IN MOUTH AND THROAT WITH OR WITHOUT PRESENCE OF ULCERS  *URINARY PROBLEMS  *BOWEL PROBLEMS  UNUSUAL RASH Items with * indicate a potential emergency and should be followed up as soon as possible.  Feel free to call the clinic you have any questions or concerns. The clinic phone number is [phone].

## 2014-07-08 ENCOUNTER — Other Ambulatory Visit (HOSPITAL_BASED_OUTPATIENT_CLINIC_OR_DEPARTMENT_OTHER): Payer: Medicare Other

## 2014-07-08 ENCOUNTER — Ambulatory Visit (HOSPITAL_BASED_OUTPATIENT_CLINIC_OR_DEPARTMENT_OTHER): Payer: Medicare Other | Admitting: Hematology and Oncology

## 2014-07-08 ENCOUNTER — Telehealth: Payer: Self-pay | Admitting: Hematology and Oncology

## 2014-07-08 ENCOUNTER — Ambulatory Visit (HOSPITAL_BASED_OUTPATIENT_CLINIC_OR_DEPARTMENT_OTHER): Payer: Medicare Other

## 2014-07-08 ENCOUNTER — Encounter: Payer: Self-pay | Admitting: Hematology and Oncology

## 2014-07-08 VITALS — BP 136/57 | HR 64 | Temp 98.3°F | Resp 20

## 2014-07-08 VITALS — BP 137/54 | HR 63 | Temp 97.5°F | Resp 20 | Ht 76.0 in

## 2014-07-08 DIAGNOSIS — C9110 Chronic lymphocytic leukemia of B-cell type not having achieved remission: Secondary | ICD-10-CM

## 2014-07-08 DIAGNOSIS — D630 Anemia in neoplastic disease: Secondary | ICD-10-CM | POA: Diagnosis not present

## 2014-07-08 DIAGNOSIS — Z5112 Encounter for antineoplastic immunotherapy: Secondary | ICD-10-CM

## 2014-07-08 LAB — CBC & DIFF AND RETIC
BASO%: 1.6 % (ref 0.0–2.0)
Basophils Absolute: 0.1 10*3/uL (ref 0.0–0.1)
EOS%: 2.2 % (ref 0.0–7.0)
Eosinophils Absolute: 0.2 10*3/uL (ref 0.0–0.5)
HCT: 33.8 % — ABNORMAL LOW (ref 38.4–49.9)
HGB: 10.6 g/dL — ABNORMAL LOW (ref 13.0–17.1)
Immature Retic Fract: 4.1 % (ref 3.00–10.60)
LYMPH%: 20.9 % (ref 14.0–49.0)
MCH: 26.2 pg — ABNORMAL LOW (ref 27.2–33.4)
MCHC: 31.4 g/dL — ABNORMAL LOW (ref 32.0–36.0)
MCV: 83.7 fL (ref 79.3–98.0)
MONO#: 0.4 10*3/uL (ref 0.1–0.9)
MONO%: 5.4 % (ref 0.0–14.0)
NEUT#: 4.8 10*3/uL (ref 1.5–6.5)
NEUT%: 69.9 % (ref 39.0–75.0)
Platelets: 190 10*3/uL (ref 140–400)
RBC: 4.04 10*6/uL — ABNORMAL LOW (ref 4.20–5.82)
RDW: 18.7 % — ABNORMAL HIGH (ref 11.0–14.6)
Retic %: 2.19 % — ABNORMAL HIGH (ref 0.80–1.80)
Retic Ct Abs: 88.48 10*3/uL (ref 34.80–93.90)
WBC: 6.8 10*3/uL (ref 4.0–10.3)
lymph#: 1.4 10*3/uL (ref 0.9–3.3)
nRBC: 0 % (ref 0–0)

## 2014-07-08 LAB — URIC ACID (CC13): Uric Acid, Serum: 5.7 mg/dl (ref 2.6–7.4)

## 2014-07-08 LAB — LACTATE DEHYDROGENASE (CC13): LDH: 112 U/L — ABNORMAL LOW (ref 125–245)

## 2014-07-08 MED ORDER — ACETAMINOPHEN 325 MG PO TABS
ORAL_TABLET | ORAL | Status: AC
Start: 2014-07-08 — End: 2014-07-08
  Filled 2014-07-08: qty 2

## 2014-07-08 MED ORDER — SODIUM CHLORIDE 0.9 % IV SOLN
Freq: Once | INTRAVENOUS | Status: AC
  Administered 2014-07-08: 10:00:00 via INTRAVENOUS

## 2014-07-08 MED ORDER — SODIUM CHLORIDE 0.9 % IJ SOLN
10.0000 mL | INTRAMUSCULAR | Status: DC | PRN
  Administered 2014-07-08: 10 mL
  Filled 2014-07-08: qty 10

## 2014-07-08 MED ORDER — ACETAMINOPHEN 325 MG PO TABS
650.0000 mg | ORAL_TABLET | Freq: Once | ORAL | Status: AC
  Administered 2014-07-08: 650 mg via ORAL

## 2014-07-08 MED ORDER — DEXAMETHASONE SODIUM PHOSPHATE 20 MG/5ML IJ SOLN
20.0000 mg | Freq: Once | INTRAMUSCULAR | Status: AC
Start: 2014-07-08 — End: 2014-07-08
  Administered 2014-07-08: 20 mg via INTRAVENOUS

## 2014-07-08 MED ORDER — HEPARIN SOD (PORK) LOCK FLUSH 100 UNIT/ML IV SOLN
500.0000 [IU] | Freq: Once | INTRAVENOUS | Status: AC | PRN
  Administered 2014-07-08: 500 [IU]
  Filled 2014-07-08: qty 5

## 2014-07-08 MED ORDER — SODIUM CHLORIDE 0.9 % IV SOLN
1000.0000 mg | Freq: Once | INTRAVENOUS | Status: AC
  Administered 2014-07-08: 1000 mg via INTRAVENOUS
  Filled 2014-07-08: qty 40

## 2014-07-08 MED ORDER — DIPHENHYDRAMINE HCL 50 MG/ML IJ SOLN
INTRAMUSCULAR | Status: AC
  Filled 2014-07-08: qty 1

## 2014-07-08 MED ORDER — DEXAMETHASONE SODIUM PHOSPHATE 20 MG/5ML IJ SOLN
INTRAMUSCULAR | Status: AC
  Filled 2014-07-08: qty 5

## 2014-07-08 MED ORDER — DIPHENHYDRAMINE HCL 50 MG/ML IJ SOLN
50.0000 mg | Freq: Once | INTRAMUSCULAR | Status: AC
  Administered 2014-07-08: 50 mg via INTRAVENOUS

## 2014-07-08 NOTE — Progress Notes (Signed)
[place] OFFICE PROGRESS NOTE  Patient Care Team: Wenda Low, MD as PCP - General (Internal Medicine) [name], MD as Attending Physician (Cardiology)  SUMMARY OF ONCOLOGIC HISTORY:   CLL (chronic lymphocytic leukemia)   09/10/2013 Initial Diagnosis CLL (chronic lymphocytic leukemia)   12/26/2013 Pathology Results FISH analysis was normal.   06/17/2014 Procedure The patient has placement of Infuse-a-Port.   06/18/2014 Imaging CT scan of the chest, abdomen and pelvis showed diffuse lymphadenopathy and splenomegaly.   06/19/2014 Bone Marrow Biopsy Bone marrow aspirate and biopsy show CLL.   06/24/2014 -  Chemotherapy He started on the Obinutuzumab and chlorambucil    INTERVAL HISTORY: Please see below for problem oriented charting. He seemed prior to his treatment today. He feels well. Denies side effects of treatment recently  REVIEW OF SYSTEMS:   Constitutional: Denies fevers, chills or abnormal weight loss Eyes: Denies blurriness of vision Ears, nose, mouth, throat, and face: Denies mucositis or sore throat Respiratory: Denies cough, dyspnea or wheezes Cardiovascular: Denies palpitation, chest discomfort or lower extremity swelling Gastrointestinal:  Denies nausea, heartburn or change in bowel habits Skin: Denies abnormal skin rashes Lymphatics: Denies new lymphadenopathy or easy bruising Neurological:Denies numbness, tingling or new weaknesses Behavioral/Psych: Mood is stable, no new changes  All other systems were reviewed with the patient and are negative.  I have reviewed the past medical history, past surgical history, social history and family history with the patient and they are unchanged from previous note.  ALLERGIES:  has No Known Allergies.  MEDICATIONS:  Current Outpatient Prescriptions  Medication Sig Dispense Refill  . acyclovir (ZOVIRAX) 400 MG tablet Take 1 tablet (400 mg total) by mouth daily.  30 tablet  5  . allopurinol (ZYLOPRIM) 300  MG tablet Take 1 tablet (300 mg total) by mouth daily.  30 tablet  0  . aspirin 81 MG tablet Take 81 mg by mouth daily. Every other day      . atorvastatin (LIPITOR) 40 MG tablet Take 40 mg by mouth daily.      . chlorambucil (LEUKERAN) 2 MG tablet Take 7 tablets (14 mg total) by mouth every 14 (fourteen) days. Take on days 1 and 15 of chemotherapy. Give on an empty stomach 1 hour before or 2 hours after meals.  14 tablet  5  . fish oil-omega-3 fatty acids 1000 MG capsule Take 2 g by mouth daily.       . isosorbide mononitrate (IMDUR) 60 MG 24 hr tablet Take 1 tablet (60 mg total) by mouth daily.  30 tablet  12  . lidocaine-prilocaine (EMLA) cream Apply 1 application topically as needed. Apply to port 1 hour prior to chemo  30 g  1  . lisinopril-hydrochlorothiazide (PRINZIDE,ZESTORETIC) 10-12.5 MG per tablet Take 1 tablet by mouth daily.      . metFORMIN (GLUCOPHAGE) 500 MG tablet Take 1,000 mg by mouth 2 (two) times daily with a meal.       . metoprolol succinate (TOPROL-XL) 50 MG 24 hr tablet Take 1 tablet (50 mg total) by mouth daily.  90 tablet  3  . predniSONE (DELTASONE) 50 MG tablet Take 1 tab today and tomorrow  2 tablet  0  . sulfamethoxazole-trimethoprim (BACTRIM DS) 800-160 MG per tablet Take 1 tablet by mouth 3 (three) times a week. On Mondays, Wednesdays and Fridays  12 tablet  5   No current facility-administered medications for this visit.    PHYSICAL EXAMINATION: ECOG PERFORMANCE STATUS: 0 - Asymptomatic  Filed  Vitals:   07/08/14 0855  BP: 137/54  Pulse: 63  Temp: 97.5 F (36.4 C)  Resp: 20   There were no vitals filed for this visit.  GENERAL:alert, no distress and comfortable. He is morbidly obese SKIN: skin color, texture, turgor are normal, no rashes or significant lesions EYES: normal, Conjunctiva are pink and non-injected, sclera clear OROPHARYNX:no exudate, no erythema and lips, buccal mucosa, and tongue normal  NECK: supple, thyroid normal size, non-tender,  without nodularity LYMPH:  no palpable lymphadenopathy in the cervical, axillary or inguinal LUNGS: clear to auscultation and percussion with normal breathing effort HEART: regular rate & rhythm and no murmurs and no lower extremity edema ABDOMEN:abdomen soft, non-tender and normal bowel sounds Musculoskeletal:no cyanosis of digits and no clubbing  NEURO: alert & oriented x 3 with fluent speech, no focal motor/sensory deficits  LABORATORY DATA:  I have reviewed the data as listed    Component Value Date/Time   NA 138 06/25/2014 0833   K 4.0 06/25/2014 0833   CL 101 03/11/2013 1031   CO2 21* 06/25/2014 0833   GLUCOSE 140 06/25/2014 0833   GLUCOSE 173* 03/11/2013 1031   GLUCOSE 128* 01/17/2013 1040   BUN 31.0* 06/25/2014 0833   CREATININE 1.2 06/25/2014 0833   CALCIUM 9.1 06/25/2014 0833   PROT 7.3 06/25/2014 0833   ALBUMIN 3.0* 06/25/2014 0833   AST 13 06/25/2014 0833   ALT 7 06/25/2014 0833   ALKPHOS 61 06/25/2014 0833   BILITOT 0.95 06/25/2014 0833    No results found for this basename: SPEP, UPEP,  kappa and lambda light chains    Lab Results  Component Value Date   WBC 6.8 07/08/2014   NEUTROABS 4.8 07/08/2014   HGB 10.6* 07/08/2014   HCT 33.8* 07/08/2014   MCV 83.7 07/08/2014   PLT 190 07/08/2014      Chemistry      Component Value Date/Time   NA 138 06/25/2014 0833   K 4.0 06/25/2014 0833   CL 101 03/11/2013 1031   CO2 21* 06/25/2014 0833   BUN 31.0* 06/25/2014 0833   CREATININE 1.2 06/25/2014 0833      Component Value Date/Time   CALCIUM 9.1 06/25/2014 0833   ALKPHOS 61 06/25/2014 0833   AST 13 06/25/2014 0833   ALT 7 06/25/2014 0833   BILITOT 0.95 06/25/2014 0833      ASSESSMENT & PLAN:  CLL (chronic lymphocytic leukemia) He had mild infusion reaction on cycle 1 day 1 of treatment. Since then he tolerated treatment well. I recommend we continue same treatment without dose adjustment.  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 At present time,  he does not require blood transfusion. We'll monitor his blood counts carefully.      No orders of the defined types were placed in this encounter.   All questions were answered. The patient knows to call the clinic with any problems, questions or concerns. No barriers to learning was detected. I spent 25 minutes counseling the patient face to face. The total time spent in the appointment was 30 minutes and more than 50% was on counseling and review of test results     [facility], [place], MD 07/08/2014 10:10 PM

## 2014-07-08 NOTE — Assessment & Plan Note (Signed)
This is due to his underlying disease. At present time, he does not require blood transfusion. We'll monitor his blood counts carefully.

## 2014-07-08 NOTE — Assessment & Plan Note (Signed)
He had mild infusion reaction on cycle 1 day 1 of treatment. Since then he tolerated treatment well. I recommend we continue same treatment without dose adjustment.

## 2014-07-08 NOTE — Patient Instructions (Signed)
[place] Discharge Instructions for Patients Receiving Chemotherapy  Today you received the following chemotherapy agents: Gazyva  To help prevent nausea and vomiting after your treatment, we encourage you to take your nausea medication as prescribed by your physician.    If you develop nausea and vomiting that is not controlled by your nausea medication, call the clinic.   BELOW ARE SYMPTOMS THAT SHOULD BE REPORTED IMMEDIATELY:  *FEVER GREATER THAN 100.5 F  *CHILLS WITH OR WITHOUT FEVER  NAUSEA AND VOMITING THAT IS NOT CONTROLLED WITH YOUR NAUSEA MEDICATION  *UNUSUAL SHORTNESS OF BREATH  *UNUSUAL BRUISING OR BLEEDING  TENDERNESS IN MOUTH AND THROAT WITH OR WITHOUT PRESENCE OF ULCERS  *URINARY PROBLEMS  *BOWEL PROBLEMS  UNUSUAL RASH Items with * indicate a potential emergency and should be followed up as soon as possible.  Feel free to call the clinic you have any questions or concerns. The clinic phone number is [phone].

## 2014-07-08 NOTE — Telephone Encounter (Signed)
gv and printed appt sched and avs for pt for NOV...sed added tx. °

## 2014-07-09 DIAGNOSIS — N182 Chronic kidney disease, stage 2 (mild): Secondary | ICD-10-CM | POA: Diagnosis not present

## 2014-07-09 DIAGNOSIS — E780 Pure hypercholesterolemia: Secondary | ICD-10-CM | POA: Diagnosis not present

## 2014-07-09 DIAGNOSIS — Z1389 Encounter for screening for other disorder: Secondary | ICD-10-CM | POA: Diagnosis not present

## 2014-07-09 DIAGNOSIS — C9110 Chronic lymphocytic leukemia of B-cell type not having achieved remission: Secondary | ICD-10-CM | POA: Diagnosis not present

## 2014-07-09 DIAGNOSIS — I2510 Atherosclerotic heart disease of native coronary artery without angina pectoris: Secondary | ICD-10-CM | POA: Diagnosis not present

## 2014-07-09 DIAGNOSIS — E1122 Type 2 diabetes mellitus with diabetic chronic kidney disease: Secondary | ICD-10-CM | POA: Diagnosis not present

## 2014-07-09 DIAGNOSIS — Z125 Encounter for screening for malignant neoplasm of prostate: Secondary | ICD-10-CM | POA: Diagnosis not present

## 2014-07-09 DIAGNOSIS — Z23 Encounter for immunization: Secondary | ICD-10-CM | POA: Diagnosis not present

## 2014-07-09 DIAGNOSIS — I10 Essential (primary) hypertension: Secondary | ICD-10-CM | POA: Diagnosis not present

## 2014-07-22 ENCOUNTER — Ambulatory Visit (HOSPITAL_BASED_OUTPATIENT_CLINIC_OR_DEPARTMENT_OTHER): Payer: Medicare Other

## 2014-07-22 ENCOUNTER — Ambulatory Visit (HOSPITAL_BASED_OUTPATIENT_CLINIC_OR_DEPARTMENT_OTHER): Payer: Medicare Other | Admitting: Hematology and Oncology

## 2014-07-22 ENCOUNTER — Other Ambulatory Visit (HOSPITAL_BASED_OUTPATIENT_CLINIC_OR_DEPARTMENT_OTHER): Payer: Medicare Other

## 2014-07-22 ENCOUNTER — Telehealth: Payer: Self-pay | Admitting: Hematology and Oncology

## 2014-07-22 ENCOUNTER — Telehealth: Payer: Self-pay | Admitting: *Deleted

## 2014-07-22 VITALS — BP 119/56 | HR 63 | Temp 97.5°F | Resp 19 | Ht 76.0 in | Wt 287.9 lb

## 2014-07-22 DIAGNOSIS — D630 Anemia in neoplastic disease: Secondary | ICD-10-CM | POA: Diagnosis not present

## 2014-07-22 DIAGNOSIS — Z5112 Encounter for antineoplastic immunotherapy: Secondary | ICD-10-CM | POA: Diagnosis not present

## 2014-07-22 DIAGNOSIS — C9110 Chronic lymphocytic leukemia of B-cell type not having achieved remission: Secondary | ICD-10-CM

## 2014-07-22 LAB — CBC & DIFF AND RETIC
BASO%: 0.6 % (ref 0.0–2.0)
Basophils Absolute: 0 10*3/uL (ref 0.0–0.1)
EOS%: 3.7 % (ref 0.0–7.0)
Eosinophils Absolute: 0.2 10*3/uL (ref 0.0–0.5)
HCT: 36 % — ABNORMAL LOW (ref 38.4–49.9)
HGB: 11.7 g/dL — ABNORMAL LOW (ref 13.0–17.1)
Immature Retic Fract: 6.3 % (ref 3.00–10.60)
LYMPH%: 23.8 % (ref 14.0–49.0)
MCH: 27.1 pg — ABNORMAL LOW (ref 27.2–33.4)
MCHC: 32.5 g/dL (ref 32.0–36.0)
MCV: 83.5 fL (ref 79.3–98.0)
MONO#: 0.4 10*3/uL (ref 0.1–0.9)
MONO%: 7.4 % (ref 0.0–14.0)
NEUT#: 3.1 10*3/uL (ref 1.5–6.5)
NEUT%: 64.5 % (ref 39.0–75.0)
Platelets: 143 10*3/uL (ref 140–400)
RBC: 4.31 10*6/uL (ref 4.20–5.82)
RDW: 18.3 % — ABNORMAL HIGH (ref 11.0–14.6)
Retic %: 1.45 % (ref 0.80–1.80)
Retic Ct Abs: 62.5 10*3/uL (ref 34.80–93.90)
WBC: 4.8 10*3/uL (ref 4.0–10.3)
lymph#: 1.2 10*3/uL (ref 0.9–3.3)

## 2014-07-22 LAB — LACTATE DEHYDROGENASE (CC13): LDH: 115 U/L — ABNORMAL LOW (ref 125–245)

## 2014-07-22 LAB — COMPREHENSIVE METABOLIC PANEL (CC13)
ALT: 17 U/L (ref 0–55)
AST: 14 U/L (ref 5–34)
Albumin: 3.8 g/dL (ref 3.5–5.0)
Alkaline Phosphatase: 60 U/L (ref 40–150)
Anion Gap: 9 mEq/L (ref 3–11)
BUN: 13.5 mg/dL (ref 7.0–26.0)
CO2: 23 mEq/L (ref 22–29)
Calcium: 9.6 mg/dL (ref 8.4–10.4)
Chloride: 105 mEq/L (ref 98–109)
Creatinine: 1.2 mg/dL (ref 0.7–1.3)
Glucose: 148 mg/dl — ABNORMAL HIGH (ref 70–140)
Potassium: 4.5 mEq/L (ref 3.5–5.1)
Sodium: 137 mEq/L (ref 136–145)
Total Bilirubin: 0.8 mg/dL (ref 0.20–1.20)
Total Protein: 7.2 g/dL (ref 6.4–8.3)

## 2014-07-22 MED ORDER — DEXAMETHASONE SODIUM PHOSPHATE 20 MG/5ML IJ SOLN
INTRAMUSCULAR | Status: AC
  Filled 2014-07-22: qty 5

## 2014-07-22 MED ORDER — DIPHENHYDRAMINE HCL 50 MG/ML IJ SOLN
50.0000 mg | Freq: Once | INTRAMUSCULAR | Status: AC
  Administered 2014-07-22: 50 mg via INTRAVENOUS

## 2014-07-22 MED ORDER — HEPARIN SOD (PORK) LOCK FLUSH 100 UNIT/ML IV SOLN
500.0000 [IU] | Freq: Once | INTRAVENOUS | Status: AC | PRN
  Administered 2014-07-22: 500 [IU]
  Filled 2014-07-22: qty 5

## 2014-07-22 MED ORDER — ACETAMINOPHEN 325 MG PO TABS
650.0000 mg | ORAL_TABLET | Freq: Once | ORAL | Status: AC
  Administered 2014-07-22: 650 mg via ORAL

## 2014-07-22 MED ORDER — SODIUM CHLORIDE 0.9 % IV SOLN
Freq: Once | INTRAVENOUS | Status: AC
  Administered 2014-07-22: 09:00:00 via INTRAVENOUS

## 2014-07-22 MED ORDER — SODIUM CHLORIDE 0.9 % IJ SOLN
10.0000 mL | INTRAMUSCULAR | Status: DC | PRN
  Administered 2014-07-22: 10 mL
  Filled 2014-07-22: qty 10

## 2014-07-22 MED ORDER — DEXAMETHASONE SODIUM PHOSPHATE 20 MG/5ML IJ SOLN
20.0000 mg | Freq: Once | INTRAMUSCULAR | Status: AC
  Administered 2014-07-22: 20 mg via INTRAVENOUS

## 2014-07-22 MED ORDER — SODIUM CHLORIDE 0.9 % IV SOLN
1000.0000 mg | Freq: Once | INTRAVENOUS | Status: AC
  Administered 2014-07-22: 1000 mg via INTRAVENOUS
  Filled 2014-07-22: qty 40

## 2014-07-22 MED ORDER — ACETAMINOPHEN 325 MG PO TABS
ORAL_TABLET | ORAL | Status: AC
  Filled 2014-07-22: qty 2

## 2014-07-22 MED ORDER — DIPHENHYDRAMINE HCL 50 MG/ML IJ SOLN
INTRAMUSCULAR | Status: AC
  Filled 2014-07-22: qty 1

## 2014-07-22 NOTE — Assessment & Plan Note (Signed)
He had mild infusion reaction on cycle 1 day 1 of treatment. Since then he tolerated treatment well. I recommend we continue same treatment without dose adjustment.

## 2014-07-22 NOTE — Telephone Encounter (Signed)
Per staff message and POF I have scheduled appts. Advised scheduler of appts. JMW  

## 2014-07-22 NOTE — Assessment & Plan Note (Signed)
This is due to his underlying disease. At present time, he does not require blood transfusion. We'll monitor his blood counts carefully.

## 2014-07-22 NOTE — Patient Instructions (Signed)
[place] Discharge Instructions for Patients Receiving Chemotherapy  Today you received the following chemotherapy agents: Gazyva.  To help prevent nausea and vomiting after your treatment, we encourage you to take your nausea medication: as directed.   If you develop nausea and vomiting that is not controlled by your nausea medication, call the clinic.   BELOW ARE SYMPTOMS THAT SHOULD BE REPORTED IMMEDIATELY:  *FEVER GREATER THAN 100.5 F  *CHILLS WITH OR WITHOUT FEVER  NAUSEA AND VOMITING THAT IS NOT CONTROLLED WITH YOUR NAUSEA MEDICATION  *UNUSUAL SHORTNESS OF BREATH  *UNUSUAL BRUISING OR BLEEDING  TENDERNESS IN MOUTH AND THROAT WITH OR WITHOUT PRESENCE OF ULCERS  *URINARY PROBLEMS  *BOWEL PROBLEMS  UNUSUAL RASH Items with * indicate a potential emergency and should be followed up as soon as possible.  Feel free to call the clinic you have any questions or concerns. The clinic phone number is [phone].

## 2014-07-22 NOTE — Progress Notes (Signed)
[place] OFFICE PROGRESS NOTE  Patient Care Team: Wenda Low, MD as PCP - General (Internal Medicine) [name], MD as Attending Physician (Cardiology)  SUMMARY OF ONCOLOGIC HISTORY:   CLL (chronic lymphocytic leukemia)   09/10/2013 Initial Diagnosis CLL (chronic lymphocytic leukemia)   12/26/2013 Pathology Results FISH analysis was normal.   06/17/2014 Procedure The patient has placement of Infuse-a-Port.   06/18/2014 Imaging CT scan of the chest, abdomen and pelvis showed diffuse lymphadenopathy and splenomegaly.   06/19/2014 Bone Marrow Biopsy Bone marrow aspirate and biopsy show CLL.   06/24/2014 -  Chemotherapy He started on the Obinutuzumab and chlorambucil    INTERVAL HISTORY: Please see below for problem oriented charting. He is seen prior to cycle 2 of treatment. He tolerated recent treatment well. Denies any side effects. No recent infection.  REVIEW OF SYSTEMS:   Constitutional: Denies fevers, chills or abnormal weight loss Eyes: Denies blurriness of vision Ears, nose, mouth, throat, and face: Denies mucositis or sore throat Respiratory: Denies cough, dyspnea or wheezes Cardiovascular: Denies palpitation, chest discomfort or lower extremity swelling Gastrointestinal:  Denies nausea, heartburn or change in bowel habits Skin: Denies abnormal skin rashes Lymphatics: Denies new lymphadenopathy or easy bruising Neurological:Denies numbness, tingling or new weaknesses Behavioral/Psych: Mood is stable, no new changes  All other systems were reviewed with the patient and are negative.  I have reviewed the past medical history, past surgical history, social history and family history with the patient and they are unchanged from previous note.  ALLERGIES:  has No Known Allergies.  MEDICATIONS:  Current Outpatient Prescriptions  Medication Sig Dispense Refill  . acyclovir (ZOVIRAX) 400 MG tablet Take 1 tablet (400 mg total) by mouth daily. 30 tablet 5  .  aspirin 81 MG tablet Take 81 mg by mouth daily. Every other day    . atorvastatin (LIPITOR) 40 MG tablet Take 40 mg by mouth daily.    . chlorambucil (LEUKERAN) 2 MG tablet Take 7 tablets (14 mg total) by mouth every 14 (fourteen) days. Take on days 1 and 15 of chemotherapy. Give on an empty stomach 1 hour before or 2 hours after meals. 14 tablet 5  . fish oil-omega-3 fatty acids 1000 MG capsule Take 2 g by mouth daily.     . isosorbide mononitrate (IMDUR) 60 MG 24 hr tablet Take 1 tablet (60 mg total) by mouth daily. 30 tablet 12  . lidocaine-prilocaine (EMLA) cream Apply 1 application topically as needed. Apply to port 1 hour prior to chemo 30 g 1  . lisinopril-hydrochlorothiazide (PRINZIDE,ZESTORETIC) 10-12.5 MG per tablet Take 1 tablet by mouth daily.    . metFORMIN (GLUCOPHAGE) 500 MG tablet Take 1,000 mg by mouth 2 (two) times daily with a meal.     . metoprolol succinate (TOPROL-XL) 50 MG 24 hr tablet Take 1 tablet (50 mg total) by mouth daily. 90 tablet 3  . sulfamethoxazole-trimethoprim (BACTRIM DS) 800-160 MG per tablet Take 1 tablet by mouth 3 (three) times a week. On Mondays, Wednesdays and Fridays 12 tablet 5   No current facility-administered medications for this visit.   Facility-Administered Medications Ordered in Other Visits  Medication Dose Route Frequency Provider Last Rate Last Dose  . heparin lock flush 100 unit/mL  500 Units Intracatheter Once PRN [name], MD      . obinutuzumab (GAZYVA) 1,000 mg in sodium chloride 0.9 % 250 mL (3.4483 mg/mL) chemo infusion  1,000 mg Intravenous Once [name], MD      .  sodium chloride 0.9 % injection 10 mL  10 mL Intracatheter PRN [name], MD        PHYSICAL EXAMINATION: ECOG PERFORMANCE STATUS: 0 - Asymptomatic  Filed Vitals:   07/22/14 0833  BP: 119/56  Pulse: 63  Temp: 97.5 F (36.4 C)  Resp: 19   Filed Weights   07/22/14 0833  Weight: 287 lb 14.4 oz (130.591 kg)    GENERAL:alert, no distress and comfortable. He  is morbidly obese SKIN: skin color, texture, turgor are normal, no rashes or significant lesions EYES: normal, Conjunctiva are pink and non-injected, sclera clear OROPHARYNX:no exudate, no erythema and lips, buccal mucosa, and tongue normal  NECK: supple, thyroid normal size, non-tender, without nodularity LYMPH:  no palpable lymphadenopathy in the cervical, axillary or inguinal LUNGS: clear to auscultation and percussion with normal breathing effort HEART: regular rate & rhythm and no murmurs and no lower extremity edema ABDOMEN:abdomen soft, non-tender and normal bowel sounds Musculoskeletal:no cyanosis of digits and no clubbing  NEURO: alert & oriented x 3 with fluent speech, no focal motor/sensory deficits  LABORATORY DATA:  I have reviewed the data as listed    Component Value Date/Time   NA 137 07/22/2014 0816   K 4.5 07/22/2014 0816   CL 101 03/11/2013 1031   CO2 23 07/22/2014 0816   GLUCOSE 148* 07/22/2014 0816   GLUCOSE 173* 03/11/2013 1031   GLUCOSE 128* 01/17/2013 1040   BUN 13.5 07/22/2014 0816   CREATININE 1.2 07/22/2014 0816   CALCIUM 9.6 07/22/2014 0816   PROT 7.2 07/22/2014 0816   ALBUMIN 3.8 07/22/2014 0816   AST 14 07/22/2014 0816   ALT 17 07/22/2014 0816   ALKPHOS 60 07/22/2014 0816   BILITOT 0.80 07/22/2014 0816    No results found for: SPEP, UPEP  Lab Results  Component Value Date   WBC 4.8 07/22/2014   NEUTROABS 3.1 07/22/2014   HGB 11.7* 07/22/2014   HCT 36.0* 07/22/2014   MCV 83.5 07/22/2014   PLT 143 07/22/2014      Chemistry      Component Value Date/Time   NA 137 07/22/2014 0816   K 4.5 07/22/2014 0816   CL 101 03/11/2013 1031   CO2 23 07/22/2014 0816   BUN 13.5 07/22/2014 0816   CREATININE 1.2 07/22/2014 0816      Component Value Date/Time   CALCIUM 9.6 07/22/2014 0816   ALKPHOS 60 07/22/2014 0816   AST 14 07/22/2014 0816   ALT 17 07/22/2014 0816   BILITOT 0.80 07/22/2014 0816      ASSESSMENT & PLAN:  CLL (chronic  lymphocytic leukemia) He had mild infusion reaction on cycle 1 day 1 of treatment. Since then he tolerated treatment well. I recommend we continue same treatment without dose adjustment.  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 At present time, he does not require blood transfusion. We'll monitor his blood counts carefully.        No orders of the defined types were placed in this encounter.   All questions were answered. The patient knows to call the clinic with any problems, questions or concerns. No barriers to learning was detected. I spent 25 minutes counseling the patient face to face. The total time spent in the appointment was 30 minutes and more than 50% was on counseling and review of test results     [facility], [place], MD 07/22/2014 9:39 AM

## 2014-07-22 NOTE — Telephone Encounter (Signed)
Gave avs & cal forDec. Sent mess to sch tx. °

## 2014-07-31 DIAGNOSIS — E119 Type 2 diabetes mellitus without complications: Secondary | ICD-10-CM | POA: Diagnosis not present

## 2014-07-31 DIAGNOSIS — H2513 Age-related nuclear cataract, bilateral: Secondary | ICD-10-CM | POA: Diagnosis not present

## 2014-08-19 ENCOUNTER — Ambulatory Visit (HOSPITAL_BASED_OUTPATIENT_CLINIC_OR_DEPARTMENT_OTHER): Payer: Medicare Other

## 2014-08-19 ENCOUNTER — Other Ambulatory Visit (HOSPITAL_BASED_OUTPATIENT_CLINIC_OR_DEPARTMENT_OTHER): Payer: Medicare Other

## 2014-08-19 ENCOUNTER — Encounter: Payer: Self-pay | Admitting: Hematology and Oncology

## 2014-08-19 ENCOUNTER — Ambulatory Visit (HOSPITAL_BASED_OUTPATIENT_CLINIC_OR_DEPARTMENT_OTHER): Payer: Medicare Other | Admitting: Hematology and Oncology

## 2014-08-19 ENCOUNTER — Telehealth: Payer: Self-pay | Admitting: Hematology and Oncology

## 2014-08-19 VITALS — BP 129/61 | HR 61 | Temp 98.6°F | Resp 18 | Ht 76.0 in | Wt 292.2 lb

## 2014-08-19 DIAGNOSIS — D696 Thrombocytopenia, unspecified: Secondary | ICD-10-CM

## 2014-08-19 DIAGNOSIS — C9110 Chronic lymphocytic leukemia of B-cell type not having achieved remission: Secondary | ICD-10-CM

## 2014-08-19 DIAGNOSIS — Z5112 Encounter for antineoplastic immunotherapy: Secondary | ICD-10-CM

## 2014-08-19 DIAGNOSIS — D630 Anemia in neoplastic disease: Secondary | ICD-10-CM

## 2014-08-19 LAB — CBC & DIFF AND RETIC
BASO%: 0.4 % (ref 0.0–2.0)
Basophils Absolute: 0 10*3/uL (ref 0.0–0.1)
EOS%: 1.8 % (ref 0.0–7.0)
Eosinophils Absolute: 0.1 10*3/uL (ref 0.0–0.5)
HCT: 38.6 % (ref 38.4–49.9)
HGB: 12.8 g/dL — ABNORMAL LOW (ref 13.0–17.1)
Immature Retic Fract: 6.7 % (ref 3.00–10.60)
LYMPH%: 19.6 % (ref 14.0–49.0)
MCH: 28.4 pg (ref 27.2–33.4)
MCHC: 33.2 g/dL (ref 32.0–36.0)
MCV: 85.8 fL (ref 79.3–98.0)
MONO#: 0.4 10*3/uL (ref 0.1–0.9)
MONO%: 7.9 % (ref 0.0–14.0)
NEUT#: 3.9 10*3/uL (ref 1.5–6.5)
NEUT%: 70.3 % (ref 39.0–75.0)
Platelets: 137 10*3/uL — ABNORMAL LOW (ref 140–400)
RBC: 4.5 10*6/uL (ref 4.20–5.82)
RDW: 18.3 % — ABNORMAL HIGH (ref 11.0–14.6)
Retic %: 1.54 % (ref 0.80–1.80)
Retic Ct Abs: 69.3 10*3/uL (ref 34.80–93.90)
WBC: 5.6 10*3/uL (ref 4.0–10.3)
lymph#: 1.1 10*3/uL (ref 0.9–3.3)

## 2014-08-19 LAB — COMPREHENSIVE METABOLIC PANEL (CC13)
ALT: 12 U/L (ref 0–55)
AST: 15 U/L (ref 5–34)
Albumin: 3.9 g/dL (ref 3.5–5.0)
Alkaline Phosphatase: 66 U/L (ref 40–150)
Anion Gap: 9 mEq/L (ref 3–11)
BUN: 17.1 mg/dL (ref 7.0–26.0)
CO2: 22 mEq/L (ref 22–29)
Calcium: 9.9 mg/dL (ref 8.4–10.4)
Chloride: 106 mEq/L (ref 98–109)
Creatinine: 1.1 mg/dL (ref 0.7–1.3)
Glucose: 163 mg/dl — ABNORMAL HIGH (ref 70–140)
Potassium: 4.4 mEq/L (ref 3.5–5.1)
Sodium: 138 mEq/L (ref 136–145)
Total Bilirubin: 0.52 mg/dL (ref 0.20–1.20)
Total Protein: 7 g/dL (ref 6.4–8.3)

## 2014-08-19 LAB — LACTATE DEHYDROGENASE (CC13): LDH: 123 U/L — ABNORMAL LOW (ref 125–245)

## 2014-08-19 MED ORDER — DEXAMETHASONE SODIUM PHOSPHATE 20 MG/5ML IJ SOLN
INTRAMUSCULAR | Status: AC
  Filled 2014-08-19: qty 5

## 2014-08-19 MED ORDER — SODIUM CHLORIDE 0.9 % IV SOLN
Freq: Once | INTRAVENOUS | Status: AC
  Administered 2014-08-19: 10:00:00 via INTRAVENOUS

## 2014-08-19 MED ORDER — DIPHENHYDRAMINE HCL 50 MG/ML IJ SOLN
50.0000 mg | Freq: Once | INTRAMUSCULAR | Status: AC
  Administered 2014-08-19: 50 mg via INTRAVENOUS

## 2014-08-19 MED ORDER — ACYCLOVIR 400 MG PO TABS: 400.0000 mg | ORAL_TABLET | Freq: Every day | ORAL | Status: DC

## 2014-08-19 MED ORDER — HEPARIN SOD (PORK) LOCK FLUSH 100 UNIT/ML IV SOLN
500.0000 [IU] | Freq: Once | INTRAVENOUS | Status: AC | PRN
  Administered 2014-08-19: 500 [IU]
  Filled 2014-08-19: qty 5

## 2014-08-19 MED ORDER — SODIUM CHLORIDE 0.9 % IJ SOLN
10.0000 mL | INTRAMUSCULAR | Status: DC | PRN
  Administered 2014-08-19: 10 mL
  Filled 2014-08-19: qty 10

## 2014-08-19 MED ORDER — ACETAMINOPHEN 325 MG PO TABS
650.0000 mg | ORAL_TABLET | Freq: Once | ORAL | Status: AC
  Administered 2014-08-19: 650 mg via ORAL

## 2014-08-19 MED ORDER — DIPHENHYDRAMINE HCL 50 MG/ML IJ SOLN
INTRAMUSCULAR | Status: AC
  Filled 2014-08-19: qty 1

## 2014-08-19 MED ORDER — ACETAMINOPHEN 325 MG PO TABS
ORAL_TABLET | ORAL | Status: AC
  Filled 2014-08-19: qty 2

## 2014-08-19 MED ORDER — SODIUM CHLORIDE 0.9 % IV SOLN
1000.0000 mg | Freq: Once | INTRAVENOUS | Status: AC
  Administered 2014-08-19: 1000 mg via INTRAVENOUS
  Filled 2014-08-19: qty 40

## 2014-08-19 MED ORDER — DEXAMETHASONE SODIUM PHOSPHATE 20 MG/5ML IJ SOLN
20.0000 mg | Freq: Once | INTRAMUSCULAR | Status: AC
  Administered 2014-08-19: 20 mg via INTRAVENOUS

## 2014-08-19 NOTE — Assessment & Plan Note (Signed)
This is due to his underlying disease. At present time, he does not require blood transfusion. We'll monitor his blood counts carefully.

## 2014-08-19 NOTE — Assessment & Plan Note (Signed)
This is likely due to recent treatment  And his underlying disease.. The patient denies recent history of bleeding such as epistaxis, hematuria or hematochezia. He is asymptomatic from the low platelet count. I will observe for now.  he does not require transfusion now. I will continue the chemotherapy at current dose without dosage adjustment.

## 2014-08-19 NOTE — Assessment & Plan Note (Signed)
He had mild infusion reaction on cycle 1 day 1 of treatment. Since then he tolerated treatment well. I recommend we continue same treatment without dose adjustment.  I will order CT scan to be done prior to next cycle of treatment to assess response to treatment.

## 2014-08-19 NOTE — Progress Notes (Signed)
[facility] OFFICE PROGRESS NOTE  Patient Care Team: Wenda Low, MD as PCP - General (Internal Medicine) [name], MD as Attending Physician (Cardiology)  SUMMARY OF ONCOLOGIC HISTORY:   CLL (chronic lymphocytic leukemia)   09/10/2013 Initial Diagnosis CLL (chronic lymphocytic leukemia)   12/26/2013 Pathology Results FISH analysis was normal.   06/17/2014 Procedure The patient has placement of Infuse-a-Port.   06/18/2014 Imaging CT scan of the chest, abdomen and pelvis showed diffuse lymphadenopathy and splenomegaly.   06/19/2014 Bone Marrow Biopsy Bone marrow aspirate and biopsy show CLL.   06/24/2014 -  Chemotherapy He started on the Obinutuzumab and chlorambucil    INTERVAL HISTORY: Please see below for problem oriented charting.  he is seen prior to cycle 3 of therapy. He denies side effects from treatment.  REVIEW OF SYSTEMS:   Constitutional: Denies fevers, chills or abnormal weight loss Eyes: Denies blurriness of vision Ears, nose, mouth, throat, and face: Denies mucositis or sore throat Respiratory: Denies cough, dyspnea or wheezes Cardiovascular: Denies palpitation, chest discomfort or lower extremity swelling Gastrointestinal:  Denies nausea, heartburn or change in bowel habits Skin: Denies abnormal skin rashes Lymphatics: Denies new lymphadenopathy or easy bruising Neurological:Denies numbness, tingling or new weaknesses Behavioral/Psych: Mood is stable, no new changes  All other systems were reviewed with the patient and are negative.  I have reviewed the past medical history, past surgical history, social history and family history with the patient and they are unchanged from previous note.  ALLERGIES:  has No Known Allergies.  MEDICATIONS:  Current Outpatient Prescriptions  Medication Sig Dispense Refill  . acyclovir (ZOVIRAX) 400 MG tablet Take 1 tablet (400 mg total) by mouth daily. 30 tablet 5  . aspirin 81 MG tablet Take 81 mg by mouth  daily. Every other day    . atorvastatin (LIPITOR) 40 MG tablet Take 40 mg by mouth daily.    . chlorambucil (LEUKERAN) 2 MG tablet Take 7 tablets (14 mg total) by mouth every 14 (fourteen) days. Take on days 1 and 15 of chemotherapy. Give on an empty stomach 1 hour before or 2 hours after meals. 14 tablet 5  . fish oil-omega-3 fatty acids 1000 MG capsule Take 2 g by mouth daily.     . isosorbide mononitrate (IMDUR) 30 MG 24 hr tablet Take 30 mg by mouth daily.    Marland Kitchen lidocaine-prilocaine (EMLA) cream Apply 1 application topically as needed. Apply to port 1 hour prior to chemo 30 g 1  . lisinopril-hydrochlorothiazide (PRINZIDE,ZESTORETIC) 10-12.5 MG per tablet Take 1 tablet by mouth daily.    . metFORMIN (GLUCOPHAGE) 1000 MG tablet Take 1,000 mg by mouth 2 (two) times daily with a meal.    . metoprolol succinate (TOPROL-XL) 50 MG 24 hr tablet Take 1 tablet (50 mg total) by mouth daily. 90 tablet 3  . sulfamethoxazole-trimethoprim (BACTRIM DS) 800-160 MG per tablet Take 1 tablet by mouth 3 (three) times a week. On Mondays, Wednesdays and Fridays 12 tablet 5   No current facility-administered medications for this visit.    PHYSICAL EXAMINATION: ECOG PERFORMANCE STATUS: 0 - Asymptomatic  Filed Vitals:   08/19/14 0921  BP: 129/61  Pulse: 61  Temp: 98.6 F (37 C)  Resp: 18   Filed Weights   08/19/14 0921  Weight: 292 lb 3.2 oz (132.541 kg)    GENERAL:alert, no distress and comfortable SKIN: skin color, texture, turgor are normal, no rashes or significant lesions EYES: normal, Conjunctiva are pink and non-injected, sclera clear OROPHARYNX:no  exudate, no erythema and lips, buccal mucosa, and tongue normal  NECK: supple, thyroid normal size, non-tender, without nodularity LYMPH:  no palpable lymphadenopathy in the cervical, axillary or inguinal LUNGS: clear to auscultation and percussion with normal breathing effort HEART: regular rate & rhythm and no murmurs and no lower extremity  edema ABDOMEN:abdomen soft, non-tender and normal bowel sounds Musculoskeletal:no cyanosis of digits and no clubbing  NEURO: alert & oriented x 3 with fluent speech, no focal motor/sensory deficits  LABORATORY DATA:  I have reviewed the data as listed    Component Value Date/Time   NA 138 08/19/2014 0852   K 4.4 08/19/2014 0852   CL 101 03/11/2013 1031   CO2 22 08/19/2014 0852   GLUCOSE 163* 08/19/2014 0852   GLUCOSE 173* 03/11/2013 1031   GLUCOSE 128* 01/17/2013 1040   BUN 17.1 08/19/2014 0852   CREATININE 1.1 08/19/2014 0852   CALCIUM 9.9 08/19/2014 0852   PROT 7.0 08/19/2014 0852   ALBUMIN 3.9 08/19/2014 0852   AST 15 08/19/2014 0852   ALT 12 08/19/2014 0852   ALKPHOS 66 08/19/2014 0852   BILITOT 0.52 08/19/2014 0852    No results found for: SPEP, UPEP  Lab Results  Component Value Date   WBC 5.6 08/19/2014   NEUTROABS 3.9 08/19/2014   HGB 12.8* 08/19/2014   HCT 38.6 08/19/2014   MCV 85.8 08/19/2014   PLT 137* 08/19/2014      Chemistry      Component Value Date/Time   NA 138 08/19/2014 0852   K 4.4 08/19/2014 0852   CL 101 03/11/2013 1031   CO2 22 08/19/2014 0852   BUN 17.1 08/19/2014 0852   CREATININE 1.1 08/19/2014 0852      Component Value Date/Time   CALCIUM 9.9 08/19/2014 0852   ALKPHOS 66 08/19/2014 0852   AST 15 08/19/2014 0852   ALT 12 08/19/2014 0852   BILITOT 0.52 08/19/2014 0852      ASSESSMENT & PLAN:  CLL (chronic lymphocytic leukemia) He had mild infusion reaction on cycle 1 day 1 of treatment. Since then he tolerated treatment well. I recommend we continue same treatment without dose adjustment.  I will order CT scan to be done prior to next cycle of treatment to assess response to treatment.    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 At present time, he does not require blood transfusion. We'll monitor his blood counts carefully.      Thrombocytopenia This is likely due to recent treatment  And  his underlying disease.. The patient denies recent history of bleeding such as epistaxis, hematuria or hematochezia. He is asymptomatic from the low platelet count. I will observe for now.  he does not require transfusion now. I will continue the chemotherapy at current dose without dosage adjustment.     Orders Placed This Encounter  Procedures  . CT Abdomen Pelvis W Contrast    Standing Status: Future     Number of Occurrences:      Standing Expiration Date: 11/19/2015    Order Specific Question:  Reason for Exam (SYMPTOM  OR DIAGNOSIS REQUIRED)    Answer:  staging CLL    Order Specific Question:  Preferred imaging location?    Answer:  [facility]  . CT Chest W Contrast    Standing Status: Future     Number of Occurrences:      Standing Expiration Date: 10/19/2015    Order Specific Question:  Reason for Exam (SYMPTOM  OR DIAGNOSIS REQUIRED)    Answer:  staging CLL    Order Specific Question:  Preferred imaging location?    Answer:  [facility]  . Comprehensive metabolic panel    Standing Status: Standing     Number of Occurrences: 9     Standing Expiration Date: 08/20/2015   All questions were answered. The patient knows to call the clinic with any problems, questions or concerns. No barriers to learning was detected. I spent 30 minutes counseling the patient face to face. The total time spent in the appointment was 40 minutes and more than 50% was on counseling and review of test results     [facility], [place], MD 08/19/2014 9:59 PM

## 2014-08-19 NOTE — Telephone Encounter (Signed)
gv adn printed appt sched and avs for pt for Dec...sed gv barium

## 2014-08-19 NOTE — Patient Instructions (Signed)
[facility] Discharge Instructions for Patients Receiving Chemotherapy  Today you received the following chemotherapy agents Gazyva.  To help prevent nausea and vomiting after your treatment, we encourage you to take your nausea medication as prescribed.   If you develop nausea and vomiting that is not controlled by your nausea medication, call the clinic.   BELOW ARE SYMPTOMS THAT SHOULD BE REPORTED IMMEDIATELY:  *FEVER GREATER THAN 100.5 F  *CHILLS WITH OR WITHOUT FEVER  NAUSEA AND VOMITING THAT IS NOT CONTROLLED WITH YOUR NAUSEA MEDICATION  *UNUSUAL SHORTNESS OF BREATH  *UNUSUAL BRUISING OR BLEEDING  TENDERNESS IN MOUTH AND THROAT WITH OR WITHOUT PRESENCE OF ULCERS  *URINARY PROBLEMS  *BOWEL PROBLEMS  UNUSUAL RASH Items with * indicate a potential emergency and should be followed up as soon as possible.  Feel free to call the clinic you have any questions or concerns. The clinic phone number is [phone].

## 2014-09-15 ENCOUNTER — Ambulatory Visit (HOSPITAL_COMMUNITY)
Admission: RE | Admit: 2014-09-15 | Discharge: 2014-09-15 | Disposition: A | Discharge status: Home / Self Care | Payer: Medicare Other | Source: Ambulatory Visit | Attending: Hematology and Oncology | Admitting: Hematology and Oncology

## 2014-09-15 ENCOUNTER — Other Ambulatory Visit (HOSPITAL_BASED_OUTPATIENT_CLINIC_OR_DEPARTMENT_OTHER): Payer: Medicare Other

## 2014-09-15 ENCOUNTER — Ambulatory Visit (HOSPITAL_COMMUNITY): Payer: Medicare Other

## 2014-09-15 DIAGNOSIS — C9110 Chronic lymphocytic leukemia of B-cell type not having achieved remission: Secondary | ICD-10-CM

## 2014-09-15 DIAGNOSIS — N200 Calculus of kidney: Secondary | ICD-10-CM | POA: Diagnosis not present

## 2014-09-15 DIAGNOSIS — I2510 Atherosclerotic heart disease of native coronary artery without angina pectoris: Secondary | ICD-10-CM | POA: Insufficient documentation

## 2014-09-15 DIAGNOSIS — K5730 Diverticulosis of large intestine without perforation or abscess without bleeding: Secondary | ICD-10-CM | POA: Insufficient documentation

## 2014-09-15 DIAGNOSIS — N400 Enlarged prostate without lower urinary tract symptoms: Secondary | ICD-10-CM | POA: Diagnosis not present

## 2014-09-15 DIAGNOSIS — R590 Localized enlarged lymph nodes: Secondary | ICD-10-CM | POA: Insufficient documentation

## 2014-09-15 DIAGNOSIS — R599 Enlarged lymph nodes, unspecified: Secondary | ICD-10-CM | POA: Diagnosis not present

## 2014-09-15 LAB — CBC & DIFF AND RETIC
BASO%: 0.6 % (ref 0.0–2.0)
Basophils Absolute: 0 10*3/uL (ref 0.0–0.1)
EOS%: 2 % (ref 0.0–7.0)
Eosinophils Absolute: 0.1 10*3/uL (ref 0.0–0.5)
HCT: 37.7 % — ABNORMAL LOW (ref 38.4–49.9)
HGB: 12.9 g/dL — ABNORMAL LOW (ref 13.0–17.1)
Immature Retic Fract: 6 % (ref 3.00–10.60)
LYMPH%: 24.5 % (ref 14.0–49.0)
MCH: 29.2 pg (ref 27.2–33.4)
MCHC: 34.2 g/dL (ref 32.0–36.0)
MCV: 85.3 fL (ref 79.3–98.0)
MONO#: 0.6 10*3/uL (ref 0.1–0.9)
MONO%: 12.2 % (ref 0.0–14.0)
NEUT#: 3.1 10*3/uL (ref 1.5–6.5)
NEUT%: 60.7 % (ref 39.0–75.0)
Platelets: 119 10*3/uL — ABNORMAL LOW (ref 140–400)
RBC: 4.42 10*6/uL (ref 4.20–5.82)
RDW: 16.5 % — ABNORMAL HIGH (ref 11.0–14.6)
Retic %: 1.62 % (ref 0.80–1.80)
Retic Ct Abs: 71.6 10*3/uL (ref 34.80–93.90)
WBC: 5 10*3/uL (ref 4.0–10.3)
lymph#: 1.2 10*3/uL (ref 0.9–3.3)

## 2014-09-15 LAB — COMPREHENSIVE METABOLIC PANEL (CC13)
ALT: 17 U/L (ref 0–55)
AST: 17 U/L (ref 5–34)
Albumin: 4 g/dL (ref 3.5–5.0)
Alkaline Phosphatase: 62 U/L (ref 40–150)
Anion Gap: 9 mEq/L (ref 3–11)
BUN: 15.2 mg/dL (ref 7.0–26.0)
CO2: 25 mEq/L (ref 22–29)
Calcium: 9.7 mg/dL (ref 8.4–10.4)
Chloride: 104 mEq/L (ref 98–109)
Creatinine: 1.1 mg/dL (ref 0.7–1.3)
EGFR: 69 mL/min/{1.73_m2} — ABNORMAL LOW (ref >90)
Glucose: 127 mg/dl (ref 70–140)
Potassium: 4.4 mEq/L (ref 3.5–5.1)
Sodium: 139 mEq/L (ref 136–145)
Total Bilirubin: 0.72 mg/dL (ref 0.20–1.20)
Total Protein: 7 g/dL (ref 6.4–8.3)

## 2014-09-15 LAB — LACTATE DEHYDROGENASE (CC13): LDH: 176 U/L (ref 125–245)

## 2014-09-15 MED ORDER — IOHEXOL 300 MG/ML  SOLN
100.0000 mL | Freq: Once | INTRAMUSCULAR | Status: AC | PRN
  Administered 2014-09-15: 100 mL via INTRAVENOUS

## 2014-09-16 ENCOUNTER — Encounter: Payer: Self-pay | Admitting: Hematology and Oncology

## 2014-09-16 ENCOUNTER — Telehealth: Payer: Self-pay | Admitting: Hematology and Oncology

## 2014-09-16 ENCOUNTER — Telehealth: Payer: Self-pay | Admitting: *Deleted

## 2014-09-16 ENCOUNTER — Ambulatory Visit (HOSPITAL_BASED_OUTPATIENT_CLINIC_OR_DEPARTMENT_OTHER): Payer: Medicare Other | Admitting: Hematology and Oncology

## 2014-09-16 VITALS — BP 128/47 | HR 56 | Temp 97.5°F | Resp 18 | Ht 76.0 in | Wt 297.4 lb

## 2014-09-16 DIAGNOSIS — D696 Thrombocytopenia, unspecified: Secondary | ICD-10-CM | POA: Diagnosis not present

## 2014-09-16 DIAGNOSIS — D630 Anemia in neoplastic disease: Secondary | ICD-10-CM | POA: Diagnosis not present

## 2014-09-16 DIAGNOSIS — C9110 Chronic lymphocytic leukemia of B-cell type not having achieved remission: Secondary | ICD-10-CM

## 2014-09-16 NOTE — Telephone Encounter (Signed)
Per staff message and POF I have scheduled appts. Advised scheduler of appts n, no availabble on 1/5 moved to 1/6 JMW

## 2014-09-16 NOTE — Assessment & Plan Note (Signed)
This is due to his underlying disease. At present time, he does not require blood transfusion. We'll monitor his blood counts carefully.

## 2014-09-16 NOTE — Telephone Encounter (Signed)
s.w. pt and advised on 1.5 appts moved to 1.6 due to chemo availability....pt ok and aware

## 2014-09-16 NOTE — Telephone Encounter (Signed)
gv adn printed appt sched and avs for pt for Jan and Feb.....emailed MW to add tx.

## 2014-09-16 NOTE — Assessment & Plan Note (Signed)
He had mild infusion reaction on cycle 1 day 1 of treatment. Since then he tolerated treatment well. I recommend we continue same treatment without dose adjustment. CT scan from 09/15/2014 show excellent response to treatment.

## 2014-09-16 NOTE — Assessment & Plan Note (Signed)
This is likely due to recent treatment and his underlying disease.. The patient denies recent history of bleeding such as epistaxis, hematuria or hematochezia. He is asymptomatic from the low platelet count. I will observe for now.  he does not require transfusion now. I will continue the chemotherapy at current dose without dosage adjustment.

## 2014-09-16 NOTE — Progress Notes (Signed)
[place] OFFICE PROGRESS NOTE  Patient Care Team: Wenda Low, MD as PCP - General (Internal Medicine) [name], MD as Attending Physician (Cardiology)  SUMMARY OF ONCOLOGIC HISTORY:   CLL (chronic lymphocytic leukemia)   09/10/2013 Initial Diagnosis CLL (chronic lymphocytic leukemia)   12/26/2013 Pathology Results FISH analysis was normal.   06/17/2014 Procedure The patient has placement of Infuse-a-Port.   06/18/2014 Imaging CT scan of the chest, abdomen and pelvis showed diffuse lymphadenopathy and splenomegaly.   06/19/2014 Bone Marrow Biopsy Bone marrow aspirate and biopsy show CLL.   06/24/2014 -  Chemotherapy He started on the Obinutuzumab and chlorambucil   09/15/2014 Imaging Repeat CT scan of the chest, abdomen and pelvis show greater than 50% reduction in lymphadenopathy and splenomegaly    INTERVAL HISTORY: Please see below for problem oriented charting. He is seen prior to cycle 4 treatment. He denies recent side effects of treatment. No recent infection.  REVIEW OF SYSTEMS:   Constitutional: Denies fevers, chills or abnormal weight loss Eyes: Denies blurriness of vision Ears, nose, mouth, throat, and face: Denies mucositis or sore throat Respiratory: Denies cough, dyspnea or wheezes Cardiovascular: Denies palpitation, chest discomfort or lower extremity swelling Gastrointestinal:  Denies nausea, heartburn or change in bowel habits Skin: Denies abnormal skin rashes Lymphatics: Denies new lymphadenopathy or easy bruising Neurological:Denies numbness, tingling or new weaknesses Behavioral/Psych: Mood is stable, no new changes  All other systems were reviewed with the patient and are negative.  I have reviewed the past medical history, past surgical history, social history and family history with the patient and they are unchanged from previous note.  ALLERGIES:  has No Known Allergies.  MEDICATIONS:  Current Outpatient Prescriptions  Medication Sig  Dispense Refill  . acyclovir (ZOVIRAX) 400 MG tablet Take 1 tablet (400 mg total) by mouth daily. 30 tablet 5  . aspirin 81 MG tablet Take 81 mg by mouth daily. Every other day    . atorvastatin (LIPITOR) 40 MG tablet Take 40 mg by mouth daily.    . chlorambucil (LEUKERAN) 2 MG tablet Take 7 tablets (14 mg total) by mouth every 14 (fourteen) days. Take on days 1 and 15 of chemotherapy. Give on an empty stomach 1 hour before or 2 hours after meals. 14 tablet 5  . fish oil-omega-3 fatty acids 1000 MG capsule Take 2 g by mouth daily.     . isosorbide mononitrate (IMDUR) 30 MG 24 hr tablet Take 30 mg by mouth daily.    Marland Kitchen lidocaine-prilocaine (EMLA) cream Apply 1 application topically as needed. Apply to port 1 hour prior to chemo 30 g 1  . lisinopril-hydrochlorothiazide (PRINZIDE,ZESTORETIC) 10-12.5 MG per tablet Take 1 tablet by mouth daily.    . metFORMIN (GLUCOPHAGE) 1000 MG tablet Take 1,000 mg by mouth 2 (two) times daily with a meal.    . metoprolol succinate (TOPROL-XL) 50 MG 24 hr tablet Take 1 tablet (50 mg total) by mouth daily. 90 tablet 3  . sulfamethoxazole-trimethoprim (BACTRIM DS) 800-160 MG per tablet Take 1 tablet by mouth 3 (three) times a week. On Mondays, Wednesdays and Fridays 12 tablet 5   No current facility-administered medications for this visit.    PHYSICAL EXAMINATION: ECOG PERFORMANCE STATUS: 0 - Asymptomatic  Filed Vitals:   09/16/14 0949  BP: 128/47  Pulse: 56  Temp: 97.5 F (36.4 C)  Resp: 18   Filed Weights   09/16/14 0949  Weight: 297 lb 6.4 oz (134.9 kg)    GENERAL:alert, no distress  and comfortable. He is morbidly obese SKIN: skin color, texture, turgor are normal, no rashes or significant lesions EYES: normal, Conjunctiva are pink and non-injected, sclera clear OROPHARYNX:no exudate, no erythema and lips, buccal mucosa, and tongue normal  NECK: supple, thyroid normal size, non-tender, without nodularity LYMPH:  no palpable lymphadenopathy in the  cervical, axillary or inguinal LUNGS: clear to auscultation and percussion with normal breathing effort HEART: regular rate & rhythm and no murmurs and no lower extremity edema ABDOMEN:abdomen soft, non-tender and normal bowel sounds Musculoskeletal:no cyanosis of digits and no clubbing  NEURO: alert & oriented x 3 with fluent speech, no focal motor/sensory deficits  LABORATORY DATA:  I have reviewed the data as listed    Component Value Date/Time   NA 139 09/15/2014 0857   K 4.4 09/15/2014 0857   CL 101 03/11/2013 1031   CO2 25 09/15/2014 0857   GLUCOSE 127 09/15/2014 0857   GLUCOSE 173* 03/11/2013 1031   GLUCOSE 128* 01/17/2013 1040   BUN 15.2 09/15/2014 0857   CREATININE 1.1 09/15/2014 0857   CALCIUM 9.7 09/15/2014 0857   PROT 7.0 09/15/2014 0857   ALBUMIN 4.0 09/15/2014 0857   AST 17 09/15/2014 0857   ALT 17 09/15/2014 0857   ALKPHOS 62 09/15/2014 0857   BILITOT 0.72 09/15/2014 0857    No results found for: SPEP, UPEP  Lab Results  Component Value Date   WBC 5.0 09/15/2014   NEUTROABS 3.1 09/15/2014   HGB 12.9* 09/15/2014   HCT 37.7* 09/15/2014   MCV 85.3 09/15/2014   PLT 119* 09/15/2014      Chemistry      Component Value Date/Time   NA 139 09/15/2014 0857   K 4.4 09/15/2014 0857   CL 101 03/11/2013 1031   CO2 25 09/15/2014 0857   BUN 15.2 09/15/2014 0857   CREATININE 1.1 09/15/2014 0857      Component Value Date/Time   CALCIUM 9.7 09/15/2014 0857   ALKPHOS 62 09/15/2014 0857   AST 17 09/15/2014 0857   ALT 17 09/15/2014 0857   BILITOT 0.72 09/15/2014 0857       RADIOGRAPHIC STUDIES: Reviewed the scans with the patient and his son I have personally reviewed the radiological images as listed and agreed with the findings in the report. Ct Chest W Contrast  09/15/2014   CLINICAL DATA:  Subsequent encounter for CLL.  EXAM: CT CHEST, ABDOMEN, AND PELVIS WITH CONTRAST  TECHNIQUE: Multidetector CT imaging of the chest, abdomen and pelvis was performed  following the standard protocol during bolus administration of intravenous contrast.  CONTRAST:  182mL OMNIPAQUE IOHEXOL 300 MG/ML  SOLN  COMPARISON:  06/18/2014.  FINDINGS: CT CHEST FINDINGS  Soft tissue / Mediastinum: Right Port-A-Cath tip is positioned in the distal SVC, near the junction with the RA.  No axillary lymphadenopathy. 10 mm high right paratracheal lymph node measured on the previous study has decreased in size, measuring 7 mm in short axis today. Other central mediastinal lymphadenopathy also appears decreased in the interval. 12 mm index subcarinal lymph node measured on the previous study has decreased in size to 4 mm short axis on the current exam. There is no hilar lymphadenopathy.  Heart size is normal. Coronary artery calcification is noted. No pericardial effusion.  Lungs / Pleura: Lungs are clear without focal airspace consolidation or pulmonary edema. No pleural effusion. No pulmonary parenchymal nodule or mass.  Bones: Tiny sclerotic focus in the upper left glenoid is unchanged. This may well represent a bone island. No  worrisome lytic or sclerotic osseous abnormality.  CT ABDOMEN AND PELVIS FINDINGS  Hepatobiliary: No focal abnormality within the liver parenchyma. A tiny (4 mm hyper attenuating focus along the nondependent Mid gallbladder wall could be bouyant stone or tiny polyp. No gallbladder wall thickening or pericholecystic fluid. No intrahepatic or extrahepatic biliary dilation.  Pancreas: No focal mass lesion. No dilatation of the main duct. No intraparenchymal cyst. No peripancreatic edema.  Spleen: Spleen has decreased in size in the interval, measuring 15.3 cm in craniocaudal length today compared to 20 cm previously. No focal abnormality is seen within the splenic parenchyma.  Adrenals/Urinary Tract: No adrenal nodule or mass. 7 x 11 mm stone is again identified in the right renal pelvis, without evidence for urinary obstruction. No right ureteral stone.  No stones or  hydronephrosis in the left kidney. No left ureteral stones.  No bladder stones.  Stomach/Bowel: Stomach is nondistended. No gastric wall thickening. No evidence of outlet obstruction. Duodenum is normally positioned as is the ligament of Treitz. No small bowel wall thickening. No small bowel dilatation. Terminal ileum is normal. The appendix is normal. Diverticular changes are scattered along the entire length of the colon, but are most pronounced in the sigmoid segment. No associated pericolonic edema or inflammation to suggest diverticulitis.  Vascular/Lymphatic: Atherosclerotic calcification is noted in the wall of the abdominal aorta without aneurysm. No gastrohepatic or hepatoduodenal ligament lymphadenopathy. No retroperitoneal lymphadenopathy. No evidence for pelvic sidewall lymphadenopathy.  Reproductive: Prostate gland normal to mildly enlarged. Seminal vesicles have normal CT imaging features.  Other: No intraperitoneal free fluid.  Musculoskeletal: Bone windows reveal no worrisome lytic or sclerotic osseous lesions.  IMPRESSION: Interval decrease in mediastinal and hilar lymphadenopathy. No residual lymphadenopathy in the chest by size criteria.  7 x 11 mm stone again noted in the right renal pelvis without evidence for urinary obstruction.  Diffuse colonic diverticulosis without diverticulitis.   Electronically Signed   By: Kennith Center M.D.   On: 09/15/2014 10:50   Ct Abdomen Pelvis W Contrast  09/15/2014   CLINICAL DATA:  Subsequent encounter for CLL.  EXAM: CT CHEST, ABDOMEN, AND PELVIS WITH CONTRAST  TECHNIQUE: Multidetector CT imaging of the chest, abdomen and pelvis was performed following the standard protocol during bolus administration of intravenous contrast.  CONTRAST:  OMNIPAQUE IOHEXOL 300 MG/ML  SOLN  COMPARISON:  06/18/2014.  FINDINGS: CT CHEST FINDINGS  Soft tissue / Mediastinum: Right Port-A-Cath tip is positioned in the distal SVC, near the junction with the RA.  No axillary  lymphadenopathy. 10 mm high right paratracheal lymph node measured on the previous study has decreased in size, measuring 7 mm in short axis today. Other central mediastinal lymphadenopathy also appears decreased in the interval. 12 mm index subcarinal lymph node measured on the previous study has decreased in size to 4 mm short axis on the current exam. There is no hilar lymphadenopathy.  Heart size is normal. Coronary artery calcification is noted. No pericardial effusion.  Lungs / Pleura: Lungs are clear without focal airspace consolidation or pulmonary edema. No pleural effusion. No pulmonary parenchymal nodule or mass.  Bones: Tiny sclerotic focus in the upper left glenoid is unchanged. This may well represent a bone island. No worrisome lytic or sclerotic osseous abnormality.  CT ABDOMEN AND PELVIS FINDINGS  Hepatobiliary: No focal abnormality within the liver parenchyma. A tiny (4 mm hyper attenuating focus along the nondependent Mid gallbladder wall could be bouyant stone or tiny polyp. No gallbladder wall thickening or pericholecystic  fluid. No intrahepatic or extrahepatic biliary dilation.  Pancreas: No focal mass lesion. No dilatation of the main duct. No intraparenchymal cyst. No peripancreatic edema.  Spleen: Spleen has decreased in size in the interval, measuring 15.3 cm in craniocaudal length today compared to 20 cm previously. No focal abnormality is seen within the splenic parenchyma.  Adrenals/Urinary Tract: No adrenal nodule or mass. 7 x 11 mm stone is again identified in the right renal pelvis, without evidence for urinary obstruction. No right ureteral stone.  No stones or hydronephrosis in the left kidney. No left ureteral stones.  No bladder stones.  Stomach/Bowel: Stomach is nondistended. No gastric wall thickening. No evidence of outlet obstruction. Duodenum is normally positioned as is the ligament of Treitz. No small bowel wall thickening. No small bowel dilatation. Terminal ileum is  normal. The appendix is normal. Diverticular changes are scattered along the entire length of the colon, but are most pronounced in the sigmoid segment. No associated pericolonic edema or inflammation to suggest diverticulitis.  Vascular/Lymphatic: Atherosclerotic calcification is noted in the wall of the abdominal aorta without aneurysm. No gastrohepatic or hepatoduodenal ligament lymphadenopathy. No retroperitoneal lymphadenopathy. No evidence for pelvic sidewall lymphadenopathy.  Reproductive: Prostate gland normal to mildly enlarged. Seminal vesicles have normal CT imaging features.  Other: No intraperitoneal free fluid.  Musculoskeletal: Bone windows reveal no worrisome lytic or sclerotic osseous lesions.  IMPRESSION: Interval decrease in mediastinal and hilar lymphadenopathy. No residual lymphadenopathy in the chest by size criteria.  7 x 11 mm stone again noted in the right renal pelvis without evidence for urinary obstruction.  Diffuse colonic diverticulosis without diverticulitis.   Electronically Signed   By: Misty Stanley M.D.   On: 09/15/2014 10:50     ASSESSMENT & PLAN:  CLL (chronic lymphocytic leukemia) He had mild infusion reaction on cycle 1 day 1 of treatment. Since then he tolerated treatment well. I recommend we continue same treatment without dose adjustment. CT scan from 09/15/2014 show excellent response to treatment.    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 At present time, he does not require blood transfusion. We'll monitor his blood counts carefully.    Thrombocytopenia This is likely due to recent treatment and his underlying disease.. The patient denies recent history of bleeding such as epistaxis, hematuria or hematochezia. He is asymptomatic from the low platelet count. I will observe for now.  he does not require transfusion now. I will continue the chemotherapy at current dose without dosage adjustment.    All questions were answered.  The patient knows to call the clinic with any problems, questions or concerns. No barriers to learning was detected. I spent 40 minutes counseling the patient face to face. The total time spent in the appointment was 55 minutes and more than 50% was on counseling and review of test results     [facility], [place], MD 09/16/2014 9:02 PM

## 2014-09-23 ENCOUNTER — Other Ambulatory Visit: Payer: Medicare Other

## 2014-09-24 ENCOUNTER — Other Ambulatory Visit (HOSPITAL_BASED_OUTPATIENT_CLINIC_OR_DEPARTMENT_OTHER): Payer: Medicare Other

## 2014-09-24 ENCOUNTER — Ambulatory Visit (HOSPITAL_BASED_OUTPATIENT_CLINIC_OR_DEPARTMENT_OTHER): Payer: Medicare Other

## 2014-09-24 DIAGNOSIS — C9110 Chronic lymphocytic leukemia of B-cell type not having achieved remission: Secondary | ICD-10-CM | POA: Diagnosis not present

## 2014-09-24 DIAGNOSIS — Z5112 Encounter for antineoplastic immunotherapy: Secondary | ICD-10-CM | POA: Diagnosis not present

## 2014-09-24 LAB — CBC & DIFF AND RETIC
BASO%: 0.7 % (ref 0.0–2.0)
Basophils Absolute: 0 10*3/uL (ref 0.0–0.1)
EOS%: 1.4 % (ref 0.0–7.0)
Eosinophils Absolute: 0 10*3/uL (ref 0.0–0.5)
HCT: 34 % — ABNORMAL LOW (ref 38.4–49.9)
HGB: 11.9 g/dL — ABNORMAL LOW (ref 13.0–17.1)
Immature Retic Fract: 9.7 % (ref 3.00–10.60)
LYMPH%: 35.9 % (ref 14.0–49.0)
MCH: 29.6 pg (ref 27.2–33.4)
MCHC: 35 g/dL (ref 32.0–36.0)
MCV: 84.6 fL (ref 79.3–98.0)
MONO#: 0.5 10*3/uL (ref 0.1–0.9)
MONO%: 16.9 % — ABNORMAL HIGH (ref 0.0–14.0)
NEUT#: 1.3 10*3/uL — ABNORMAL LOW (ref 1.5–6.5)
NEUT%: 45.1 % (ref 39.0–75.0)
Platelets: 92 10*3/uL — ABNORMAL LOW (ref 140–400)
RBC: 4.02 10*6/uL — ABNORMAL LOW (ref 4.20–5.82)
RDW: 16.3 % — ABNORMAL HIGH (ref 11.0–14.6)
Retic %: 2.61 % — ABNORMAL HIGH (ref 0.80–1.80)
Retic Ct Abs: 104.92 10*3/uL — ABNORMAL HIGH (ref 34.80–93.90)
WBC: 2.8 10*3/uL — ABNORMAL LOW (ref 4.0–10.3)
lymph#: 1 10*3/uL (ref 0.9–3.3)

## 2014-09-24 LAB — COMPREHENSIVE METABOLIC PANEL (CC13)
ALT: 12 U/L (ref 0–55)
AST: 16 U/L (ref 5–34)
Albumin: 3.9 g/dL (ref 3.5–5.0)
Alkaline Phosphatase: 64 U/L (ref 40–150)
Anion Gap: 10 mEq/L (ref 3–11)
BUN: 14.6 mg/dL (ref 7.0–26.0)
CO2: 26 mEq/L (ref 22–29)
Calcium: 9.4 mg/dL (ref 8.4–10.4)
Chloride: 103 mEq/L (ref 98–109)
Creatinine: 1.1 mg/dL (ref 0.7–1.3)
EGFR: 70 mL/min/{1.73_m2} — ABNORMAL LOW (ref >90)
Glucose: 120 mg/dl (ref 70–140)
Potassium: 4 mEq/L (ref 3.5–5.1)
Sodium: 138 mEq/L (ref 136–145)
Total Bilirubin: 1.46 mg/dL — ABNORMAL HIGH (ref 0.20–1.20)
Total Protein: 6.7 g/dL (ref 6.4–8.3)

## 2014-09-24 LAB — LACTATE DEHYDROGENASE (CC13): LDH: 184 U/L (ref 125–245)

## 2014-09-24 MED ORDER — SODIUM CHLORIDE 0.9 % IJ SOLN
10.0000 mL | INTRAMUSCULAR | Status: DC | PRN
  Administered 2014-09-24: 10 mL
  Filled 2014-09-24: qty 10

## 2014-09-24 MED ORDER — ACETAMINOPHEN 325 MG PO TABS
650.0000 mg | ORAL_TABLET | Freq: Once | ORAL | Status: AC
  Administered 2014-09-24: 650 mg via ORAL

## 2014-09-24 MED ORDER — SODIUM CHLORIDE 0.9 % IV SOLN
Freq: Once | INTRAVENOUS | Status: AC
  Administered 2014-09-24: 14:00:00 via INTRAVENOUS

## 2014-09-24 MED ORDER — DEXAMETHASONE SODIUM PHOSPHATE 20 MG/5ML IJ SOLN
INTRAMUSCULAR | Status: AC
  Filled 2014-09-24: qty 5

## 2014-09-24 MED ORDER — ACETAMINOPHEN 325 MG PO TABS
ORAL_TABLET | ORAL | Status: AC
  Filled 2014-09-24: qty 2

## 2014-09-24 MED ORDER — DIPHENHYDRAMINE HCL 50 MG/ML IJ SOLN
50.0000 mg | Freq: Once | INTRAMUSCULAR | Status: AC
  Administered 2014-09-24: 50 mg via INTRAVENOUS

## 2014-09-24 MED ORDER — SODIUM CHLORIDE 0.9 % IV SOLN
1000.0000 mg | Freq: Once | INTRAVENOUS | Status: AC
  Administered 2014-09-24: 1000 mg via INTRAVENOUS
  Filled 2014-09-24: qty 40

## 2014-09-24 MED ORDER — DEXAMETHASONE SODIUM PHOSPHATE 20 MG/5ML IJ SOLN
20.0000 mg | Freq: Once | INTRAMUSCULAR | Status: AC
  Administered 2014-09-24: 20 mg via INTRAVENOUS

## 2014-09-24 MED ORDER — DIPHENHYDRAMINE HCL 50 MG/ML IJ SOLN
INTRAMUSCULAR | Status: AC
  Filled 2014-09-24: qty 1

## 2014-09-24 MED ORDER — HEPARIN SOD (PORK) LOCK FLUSH 100 UNIT/ML IV SOLN
500.0000 [IU] | Freq: Once | INTRAVENOUS | Status: AC | PRN
Start: 2014-09-24 — End: 2014-09-24
  Administered 2014-09-24: 500 [IU]
  Filled 2014-09-24: qty 5

## 2014-09-24 NOTE — Patient Instructions (Signed)
[place] Discharge Instructions for Patients Receiving Chemotherapy  Today you received the following chemotherapy agents: Gazyva.  To help prevent nausea and vomiting after your treatment, we encourage you to take your nausea medication: as directed.   If you develop nausea and vomiting that is not controlled by your nausea medication, call the clinic.   BELOW ARE SYMPTOMS THAT SHOULD BE REPORTED IMMEDIATELY:  *FEVER GREATER THAN 100.5 F  *CHILLS WITH OR WITHOUT FEVER  NAUSEA AND VOMITING THAT IS NOT CONTROLLED WITH YOUR NAUSEA MEDICATION  *UNUSUAL SHORTNESS OF BREATH  *UNUSUAL BRUISING OR BLEEDING  TENDERNESS IN MOUTH AND THROAT WITH OR WITHOUT PRESENCE OF ULCERS  *URINARY PROBLEMS  *BOWEL PROBLEMS  UNUSUAL RASH Items with * indicate a potential emergency and should be followed up as soon as possible.  Feel free to call the clinic you have any questions or concerns. The clinic phone number is [phone].

## 2014-09-24 NOTE — Progress Notes (Signed)
Dr. Alvy Bimler reviewed pt's CBC and CMET today and gave order ok to treat w/ Gazyva today.  She instructs for pt to hold his Chlorambucil until he sees her again.  Message given to infusion RN, Beth, and she will give instructions to pt.Marland Kitchen

## 2014-09-24 NOTE — Progress Notes (Signed)
Reviewed labs with patient, specifically Bilirubin. Pt. States he has been drinking more wine lately. counselled patient to cut back on alcohol intake as any type of alcohol can affect liver functions.  Pt. vpoiced understanding. Also instructed pt. To not take Chlorambucil until his next appt. With Dr. Alvy Bimler. He forgot to take this morning.

## 2014-10-15 ENCOUNTER — Ambulatory Visit: Payer: Medicare Other | Admitting: Cardiology

## 2014-10-20 ENCOUNTER — Telehealth: Payer: Self-pay | Admitting: *Deleted

## 2014-10-20 NOTE — Telephone Encounter (Signed)
NOTIFIED PT. TO TAKE THE LEUKERAN BEFORE HIS TREATMENT TOMORROW. HE VOICES UNDERSTANDING.

## 2014-10-20 NOTE — Telephone Encounter (Signed)
Yes

## 2014-10-20 NOTE — Telephone Encounter (Signed)
Felicity DID NOT HAVE PT. TAKE HIS LEUKERAN BEFORE HIS PREVIOUS TREATMENT.

## 2014-10-21 ENCOUNTER — Encounter: Payer: Self-pay | Admitting: Hematology and Oncology

## 2014-10-21 ENCOUNTER — Other Ambulatory Visit: Payer: Self-pay | Admitting: Hematology and Oncology

## 2014-10-21 ENCOUNTER — Other Ambulatory Visit (HOSPITAL_BASED_OUTPATIENT_CLINIC_OR_DEPARTMENT_OTHER): Payer: Medicare Other

## 2014-10-21 ENCOUNTER — Telehealth: Payer: Self-pay | Admitting: *Deleted

## 2014-10-21 ENCOUNTER — Ambulatory Visit (HOSPITAL_BASED_OUTPATIENT_CLINIC_OR_DEPARTMENT_OTHER): Payer: Medicare Other | Admitting: Hematology and Oncology

## 2014-10-21 ENCOUNTER — Telehealth: Payer: Self-pay | Admitting: Hematology and Oncology

## 2014-10-21 ENCOUNTER — Ambulatory Visit (HOSPITAL_BASED_OUTPATIENT_CLINIC_OR_DEPARTMENT_OTHER): Payer: Medicare Other

## 2014-10-21 VITALS — BP 134/59 | HR 64 | Temp 98.2°F | Resp 18 | Ht 76.0 in | Wt 304.5 lb

## 2014-10-21 DIAGNOSIS — C9110 Chronic lymphocytic leukemia of B-cell type not having achieved remission: Secondary | ICD-10-CM

## 2014-10-21 DIAGNOSIS — D630 Anemia in neoplastic disease: Secondary | ICD-10-CM

## 2014-10-21 DIAGNOSIS — T451X5A Adverse effect of antineoplastic and immunosuppressive drugs, initial encounter: Secondary | ICD-10-CM

## 2014-10-21 DIAGNOSIS — D701 Agranulocytosis secondary to cancer chemotherapy: Secondary | ICD-10-CM | POA: Diagnosis not present

## 2014-10-21 DIAGNOSIS — D696 Thrombocytopenia, unspecified: Secondary | ICD-10-CM | POA: Diagnosis not present

## 2014-10-21 DIAGNOSIS — Z5112 Encounter for antineoplastic immunotherapy: Secondary | ICD-10-CM | POA: Diagnosis not present

## 2014-10-21 LAB — CBC & DIFF AND RETIC
BASO%: 1.2 % (ref 0.0–2.0)
Basophils Absolute: 0 10*3/uL (ref 0.0–0.1)
EOS%: 1.2 % (ref 0.0–7.0)
Eosinophils Absolute: 0 10*3/uL (ref 0.0–0.5)
HCT: 37.5 % — ABNORMAL LOW (ref 38.4–49.9)
HGB: 12.8 g/dL — ABNORMAL LOW (ref 13.0–17.1)
Immature Retic Fract: 6.2 % (ref 3.00–10.60)
LYMPH%: 50.3 % — ABNORMAL HIGH (ref 14.0–49.0)
MCH: 30.5 pg (ref 27.2–33.4)
MCHC: 34.1 g/dL (ref 32.0–36.0)
MCV: 89.3 fL (ref 79.3–98.0)
MONO#: 0.8 10*3/uL (ref 0.1–0.9)
MONO%: 46.1 % — ABNORMAL HIGH (ref 0.0–14.0)
NEUT#: 0 10*3/uL — CL (ref 1.5–6.5)
NEUT%: 1.2 % — ABNORMAL LOW (ref 39.0–75.0)
Platelets: 116 10*3/uL — ABNORMAL LOW (ref 140–400)
RBC: 4.2 10*6/uL (ref 4.20–5.82)
RDW: 15.1 % — ABNORMAL HIGH (ref 11.0–14.6)
Retic %: 1.89 % — ABNORMAL HIGH (ref 0.80–1.80)
Retic Ct Abs: 79.38 10*3/uL (ref 34.80–93.90)
WBC: 1.7 10*3/uL — ABNORMAL LOW (ref 4.0–10.3)
lymph#: 0.8 10*3/uL — ABNORMAL LOW (ref 0.9–3.3)

## 2014-10-21 LAB — COMPREHENSIVE METABOLIC PANEL (CC13)
ALT: 8 U/L (ref 0–55)
AST: 13 U/L (ref 5–34)
Albumin: 3.8 g/dL (ref 3.5–5.0)
Alkaline Phosphatase: 56 U/L (ref 40–150)
Anion Gap: 11 mEq/L (ref 3–11)
BUN: 14.3 mg/dL (ref 7.0–26.0)
CO2: 24 mEq/L (ref 22–29)
Calcium: 9.2 mg/dL (ref 8.4–10.4)
Chloride: 101 mEq/L (ref 98–109)
Creatinine: 1.2 mg/dL (ref 0.7–1.3)
EGFR: 62 mL/min/{1.73_m2} — ABNORMAL LOW (ref >90)
Glucose: 158 mg/dl — ABNORMAL HIGH (ref 70–140)
Potassium: 4.3 mEq/L (ref 3.5–5.1)
Sodium: 136 mEq/L (ref 136–145)
Total Bilirubin: 1.22 mg/dL — ABNORMAL HIGH (ref 0.20–1.20)
Total Protein: 6.8 g/dL (ref 6.4–8.3)

## 2014-10-21 LAB — LACTATE DEHYDROGENASE (CC13): LDH: 121 U/L — ABNORMAL LOW (ref 125–245)

## 2014-10-21 MED ORDER — ACETAMINOPHEN 325 MG PO TABS
ORAL_TABLET | ORAL | Status: AC
  Filled 2014-10-21: qty 2

## 2014-10-21 MED ORDER — SODIUM CHLORIDE 0.9 % IV SOLN
1000.0000 mg | Freq: Once | INTRAVENOUS | Status: AC
  Administered 2014-10-21: 1000 mg via INTRAVENOUS
  Filled 2014-10-21: qty 40

## 2014-10-21 MED ORDER — DIPHENHYDRAMINE HCL 50 MG/ML IJ SOLN
INTRAMUSCULAR | Status: AC
  Filled 2014-10-21: qty 1

## 2014-10-21 MED ORDER — SODIUM CHLORIDE 0.9 % IJ SOLN
10.0000 mL | INTRAMUSCULAR | Status: DC | PRN
  Administered 2014-10-21: 10 mL
  Filled 2014-10-21: qty 10

## 2014-10-21 MED ORDER — DEXAMETHASONE SODIUM PHOSPHATE 20 MG/5ML IJ SOLN
INTRAMUSCULAR | Status: AC
  Filled 2014-10-21: qty 5

## 2014-10-21 MED ORDER — ACETAMINOPHEN 325 MG PO TABS
650.0000 mg | ORAL_TABLET | Freq: Once | ORAL | Status: AC
  Administered 2014-10-21: 650 mg via ORAL

## 2014-10-21 MED ORDER — DIPHENHYDRAMINE HCL 50 MG/ML IJ SOLN
50.0000 mg | Freq: Once | INTRAMUSCULAR | Status: AC
  Administered 2014-10-21: 50 mg via INTRAVENOUS

## 2014-10-21 MED ORDER — SODIUM CHLORIDE 0.9 % IV SOLN
Freq: Once | INTRAVENOUS | Status: AC
  Administered 2014-10-21: 09:00:00 via INTRAVENOUS

## 2014-10-21 MED ORDER — DEXAMETHASONE SODIUM PHOSPHATE 20 MG/5ML IJ SOLN
20.0000 mg | Freq: Once | INTRAMUSCULAR | Status: AC
  Administered 2014-10-21: 20 mg via INTRAVENOUS

## 2014-10-21 MED ORDER — HEPARIN SOD (PORK) LOCK FLUSH 100 UNIT/ML IV SOLN
500.0000 [IU] | Freq: Once | INTRAVENOUS | Status: AC | PRN
  Administered 2014-10-21: 500 [IU]
  Filled 2014-10-21: qty 5

## 2014-10-21 NOTE — Progress Notes (Signed)
Ok to treat today per Dr. Alvy Bimler.  She has reviewed CBC.

## 2014-10-21 NOTE — Telephone Encounter (Signed)
added appt per MD...per MD pt aware

## 2014-10-21 NOTE — Telephone Encounter (Signed)
Pt confirmed labs/ov per 02/02 POF, gave pt AVS.... KJ, sent msg to add chemo.... °

## 2014-10-21 NOTE — Telephone Encounter (Signed)
Per staff message and POF I have scheduled appts. Advised scheduler of appts. JMW  

## 2014-10-21 NOTE — Assessment & Plan Note (Signed)
He had mild infusion reaction on cycle 1 day 1 of treatment. Since then he tolerated treatment well. I recommend we continue same treatment without dose adjustment. CT scan from 09/15/2014 show excellent response to treatment. I will cover him with Neulasta due to leukopenia. I will discontinue chlorambucil from now on.

## 2014-10-21 NOTE — Assessment & Plan Note (Signed)
This is likely due to recent treatment and his underlying disease.. The patient denies recent history of bleeding such as epistaxis, hematuria or hematochezia. He is asymptomatic from the low platelet count. I will observe for now.  he does not require transfusion now. I will continue the chemotherapy at current dose without dosage adjustment.

## 2014-10-21 NOTE — Progress Notes (Signed)
[facility] OFFICE PROGRESS NOTE  Patient Care Team: Wenda Low, MD as PCP - General (Internal Medicine) [name], MD as Attending Physician (Cardiology)  SUMMARY OF ONCOLOGIC HISTORY:   CLL (chronic lymphocytic leukemia)   09/10/2013 Initial Diagnosis CLL (chronic lymphocytic leukemia)   12/26/2013 Pathology Results FISH analysis was normal.   06/17/2014 Procedure The patient has placement of Infuse-a-Port.   06/18/2014 Imaging CT scan of the chest, abdomen and pelvis showed diffuse lymphadenopathy and splenomegaly.   06/19/2014 Bone Marrow Biopsy Bone marrow aspirate and biopsy show CLL.   06/24/2014 -  Chemotherapy He started on the Obinutuzumab and chlorambucil   09/15/2014 Imaging Repeat CT scan of the chest, abdomen and pelvis show greater than 50% reduction in lymphadenopathy and splenomegaly    INTERVAL HISTORY: Please see below for problem oriented charting. He is seen today prior to cycle 5 of treatment. He complained of mild symptoms of nasal congestion. Denies any sore throat, fevers or chills. Denies new lymphadenopathy.  REVIEW OF SYSTEMS:   Constitutional: Denies fevers, chills or abnormal weight loss Eyes: Denies blurriness of vision Ears, nose, mouth, throat, and face: Denies mucositis or sore throat Respiratory: Denies cough, dyspnea or wheezes Cardiovascular: Denies palpitation, chest discomfort or lower extremity swelling Gastrointestinal:  Denies nausea, heartburn or change in bowel habits Skin: Denies abnormal skin rashes Lymphatics: Denies new lymphadenopathy or easy bruising Neurological:Denies numbness, tingling or new weaknesses Behavioral/Psych: Mood is stable, no new changes  All other systems were reviewed with the patient and are negative.  I have reviewed the past medical history, past surgical history, social history and family history with the patient and they are unchanged from previous note.  ALLERGIES:  has No Known  Allergies.  MEDICATIONS:  Current Outpatient Prescriptions  Medication Sig Dispense Refill  . aspirin 81 MG tablet Take 81 mg by mouth daily. Every other day    . atorvastatin (LIPITOR) 40 MG tablet Take 40 mg by mouth daily.    . chlorambucil (LEUKERAN) 2 MG tablet Take 7 tablets (14 mg total) by mouth every 14 (fourteen) days. Take on days 1 and 15 of chemotherapy. Give on an empty stomach 1 hour before or 2 hours after meals. 14 tablet 5  . fish oil-omega-3 fatty acids 1000 MG capsule Take 2 g by mouth daily.     . isosorbide mononitrate (IMDUR) 30 MG 24 hr tablet Take 30 mg by mouth daily.    Marland Kitchen lidocaine-prilocaine (EMLA) cream Apply 1 application topically as needed. Apply to port 1 hour prior to chemo 30 g 1  . lisinopril-hydrochlorothiazide (PRINZIDE,ZESTORETIC) 10-12.5 MG per tablet Take 1 tablet by mouth daily.    . metFORMIN (GLUCOPHAGE) 1000 MG tablet Take 1,000 mg by mouth 2 (two) times daily with a meal.    . metoprolol succinate (TOPROL-XL) 50 MG 24 hr tablet Take 1 tablet (50 mg total) by mouth daily. 90 tablet 3  . acyclovir (ZOVIRAX) 400 MG tablet Take 1 tablet (400 mg total) by mouth daily. (Patient not taking: Reported on 10/21/2014) 30 tablet 5   No current facility-administered medications for this visit.   Facility-Administered Medications Ordered in Other Visits  Medication Dose Route Frequency Provider Last Rate Last Dose  . sodium chloride 0.9 % injection 10 mL  10 mL Intracatheter PRN [name], MD   10 mL at 10/21/14 1348    PHYSICAL EXAMINATION: ECOG PERFORMANCE STATUS: 0 - Asymptomatic  Filed Vitals:   10/21/14 0900  BP: 134/59  Pulse: 64  Temp: 98.2 F (36.8 C)  Resp: 18   Filed Weights   10/21/14 0900  Weight: 304 lb 8 oz (138.12 kg)    GENERAL:alert, no distress and comfortable. He is morbidly obese SKIN: skin color, texture, turgor are normal, no rashes or significant lesions EYES: normal, Conjunctiva are pink and non-injected, sclera  clear OROPHARYNX:no exudate, no erythema and lips, buccal mucosa, and tongue normal  NECK: supple, thyroid normal size, non-tender, without nodularity LYMPH:  no palpable lymphadenopathy in the cervical, axillary or inguinal LUNGS: clear to auscultation and percussion with normal breathing effort HEART: regular rate & rhythm and no murmurs and no lower extremity edema ABDOMEN:abdomen soft, non-tender and normal bowel sounds Musculoskeletal:no cyanosis of digits and no clubbing  NEURO: alert & oriented x 3 with fluent speech, no focal motor/sensory deficits  LABORATORY DATA:  I have reviewed the data as listed    Component Value Date/Time   NA 136 10/21/2014 0825   K 4.3 10/21/2014 0825   CL 101 03/11/2013 1031   CO2 24 10/21/2014 0825   GLUCOSE 158* 10/21/2014 0825   GLUCOSE 173* 03/11/2013 1031   GLUCOSE 128* 01/17/2013 1040   BUN 14.3 10/21/2014 0825   CREATININE 1.2 10/21/2014 0825   CALCIUM 9.2 10/21/2014 0825   PROT 6.8 10/21/2014 0825   ALBUMIN 3.8 10/21/2014 0825   AST 13 10/21/2014 0825   ALT 8 10/21/2014 0825   ALKPHOS 56 10/21/2014 0825   BILITOT 1.22* 10/21/2014 0825    No results found for: SPEP, UPEP  Lab Results  Component Value Date   WBC 1.7* 10/21/2014   NEUTROABS 0.0* 10/21/2014   HGB 12.8* 10/21/2014   HCT 37.5* 10/21/2014   MCV 89.3 10/21/2014   PLT 116* 10/21/2014      Chemistry      Component Value Date/Time   NA 136 10/21/2014 0825   K 4.3 10/21/2014 0825   CL 101 03/11/2013 1031   CO2 24 10/21/2014 0825   BUN 14.3 10/21/2014 0825   CREATININE 1.2 10/21/2014 0825      Component Value Date/Time   CALCIUM 9.2 10/21/2014 0825   ALKPHOS 56 10/21/2014 0825   AST 13 10/21/2014 0825   ALT 8 10/21/2014 0825   BILITOT 1.22* 10/21/2014 0825      ASSESSMENT & PLAN:  CLL (chronic lymphocytic leukemia) He had mild infusion reaction on cycle 1 day 1 of treatment. Since then he tolerated treatment well. I recommend we continue same treatment  without dose adjustment. CT scan from 09/15/2014 show excellent response to treatment. I will cover him with Neulasta due to leukopenia. I will discontinue chlorambucil from now on.    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 At present time, he does not require blood transfusion. We'll monitor his blood counts carefully.    Thrombocytopenia This is likely due to recent treatment and his underlying disease.. The patient denies recent history of bleeding such as epistaxis, hematuria or hematochezia. He is asymptomatic from the low platelet count. I will observe for now.  he does not require transfusion now. I will continue the chemotherapy at current dose without dosage adjustment.    Leukopenia due to antineoplastic chemotherapy I will discontinue chlorambucil after today. I will cover him with Neulasta injection tomorrow to prevent risk of neutropenic fever.    No orders of the defined types were placed in this encounter.   All questions were answered. The patient knows to call the clinic with any problems, questions  or concerns. No barriers to learning was detected. I spent 25 minutes counseling the patient face to face. The total time spent in the appointment was 30 minutes and more than 50% was on counseling and review of test results     [facility] [place], [place], MD 10/21/2014 3:12 PM

## 2014-10-21 NOTE — Assessment & Plan Note (Signed)
This is due to his underlying disease. At present time, he does not require blood transfusion. We'll monitor his blood counts carefully.

## 2014-10-21 NOTE — Patient Instructions (Signed)
[place] [facility] Discharge Instructions for Patients Receiving Chemotherapy  Today you received the following chemotherapy agents: Gazyva.  To help prevent nausea and vomiting after your treatment, we encourage you to take your nausea medication as prescribed.   If you develop nausea and vomiting that is not controlled by your nausea medication, call the clinic.   BELOW ARE SYMPTOMS THAT SHOULD BE REPORTED IMMEDIATELY:  *FEVER GREATER THAN 100.5 F  *CHILLS WITH OR WITHOUT FEVER  NAUSEA AND VOMITING THAT IS NOT CONTROLLED WITH YOUR NAUSEA MEDICATION  *UNUSUAL SHORTNESS OF BREATH  *UNUSUAL BRUISING OR BLEEDING  TENDERNESS IN MOUTH AND THROAT WITH OR WITHOUT PRESENCE OF ULCERS  *URINARY PROBLEMS  *BOWEL PROBLEMS  UNUSUAL RASH Items with * indicate a potential emergency and should be followed up as soon as possible.  Feel free to call the clinic you have any questions or concerns. The clinic phone number is [phone].

## 2014-10-21 NOTE — Progress Notes (Signed)
After first 30 minutes of gazyva infusion, patient's temp up to 100.1 from baseline 99. Patient denies shaking, chills, or any other changes. Per Dr. Alvy Bimler, continue with infusion per protocol and notify MD if temp increases to 101.

## 2014-10-21 NOTE — Assessment & Plan Note (Signed)
I will discontinue chlorambucil after today. I will cover him with Neulasta injection tomorrow to prevent risk of neutropenic fever.

## 2014-10-22 ENCOUNTER — Ambulatory Visit (HOSPITAL_BASED_OUTPATIENT_CLINIC_OR_DEPARTMENT_OTHER): Payer: Medicare Other

## 2014-10-22 ENCOUNTER — Telehealth: Payer: Self-pay | Admitting: *Deleted

## 2014-10-22 DIAGNOSIS — Z5189 Encounter for other specified aftercare: Secondary | ICD-10-CM

## 2014-10-22 DIAGNOSIS — C9110 Chronic lymphocytic leukemia of B-cell type not having achieved remission: Secondary | ICD-10-CM | POA: Diagnosis not present

## 2014-10-22 MED ORDER — PEGFILGRASTIM INJECTION 6 MG/0.6ML
6.0000 mg | Freq: Once | SUBCUTANEOUS | Status: AC
  Administered 2014-10-22: 6 mg via SUBCUTANEOUS
  Filled 2014-10-22: qty 0.6

## 2014-10-22 NOTE — Patient Instructions (Signed)
Pegfilgrastim injection What is this medicine? PEGFILGRASTIM (peg fil GRA stim) is a long-acting granulocyte colony-stimulating factor that stimulates the growth of neutrophils, a type of white blood cell important in the body's fight against infection. It is used to reduce the incidence of fever and infection in patients with certain types of cancer who are receiving chemotherapy that affects the bone marrow. This medicine may be used for other purposes; ask your health care provider or pharmacist if you have questions. COMMON BRAND NAME(S): Neulasta What should I tell my health care provider before I take this medicine? They need to know if you have any of these conditions: -latex allergy -ongoing radiation therapy -sickle cell disease -skin reactions to acrylic adhesives (On-Body Injector only) -an unusual or allergic reaction to pegfilgrastim, filgrastim, other medicines, foods, dyes, or preservatives -pregnant or trying to get pregnant -breast-feeding How should I use this medicine? This medicine is for injection under the skin. If you get this medicine at home, you will be taught how to prepare and give the pre-filled syringe or how to use the On-body Injector. Refer to the patient Instructions for Use for detailed instructions. Use exactly as directed. Take your medicine at regular intervals. Do not take your medicine more often than directed. It is important that you put your used needles and syringes in a special sharps container. Do not put them in a trash can. If you do not have a sharps container, call your pharmacist or healthcare provider to get one. Talk to your pediatrician regarding the use of this medicine in children. Special care may be needed. Overdosage: If you think you have taken too much of this medicine contact a poison control center or emergency room at once. NOTE: This medicine is only for you. Do not share this medicine with others. What if I miss a dose? It is  important not to miss your dose. Call your doctor or health care professional if you miss your dose. If you miss a dose due to an On-body Injector failure or leakage, a new dose should be administered as soon as possible using a single prefilled syringe for manual use. What may interact with this medicine? Interactions have not been studied. Give your health care provider a list of all the medicines, herbs, non-prescription drugs, or dietary supplements you use. Also tell them if you smoke, drink alcohol, or use illegal drugs. Some items may interact with your medicine. This list may not describe all possible interactions. Give your health care provider a list of all the medicines, herbs, non-prescription drugs, or dietary supplements you use. Also tell them if you smoke, drink alcohol, or use illegal drugs. Some items may interact with your medicine. What should I watch for while using this medicine? You may need blood work done while you are taking this medicine. If you are going to need a MRI, CT scan, or other procedure, tell your doctor that you are using this medicine (On-Body Injector only). What side effects may I notice from receiving this medicine? Side effects that you should report to your doctor or health care professional as soon as possible: -allergic reactions like skin rash, itching or hives, swelling of the face, lips, or tongue -dizziness -fever -pain, redness, or irritation at site where injected -pinpoint red spots on the skin -shortness of breath or breathing problems -stomach or side pain, or pain at the shoulder -swelling -tiredness -trouble passing urine Side effects that usually do not require medical attention (report to your doctor   or health care professional if they continue or are bothersome): -bone pain -muscle pain This list may not describe all possible side effects. Call your doctor for medical advice about side effects. You may report side effects to FDA at  1-800-FDA-1088. Where should I keep my medicine? Keep out of the reach of children. Store pre-filled syringes in a refrigerator between 2 and 8 degrees C (36 and 46 degrees F). Do not freeze. Keep in carton to protect from light. Throw away this medicine if it is left out of the refrigerator for more than 48 hours. Throw away any unused medicine after the expiration date. NOTE: This sheet is a summary. It may not cover all possible information. If you have questions about this medicine, talk to your doctor, pharmacist, or health care provider.  2015, [employer]. (2013-12-05 16:14:05)  

## 2014-10-22 NOTE — Telephone Encounter (Signed)
March here for Neulasta injection following lst Gazyva chemotherapy.  States that he is doing well.  No nausea, vomiting or diarrhea.  Is not drinking much fluids.  Encouraged to drink more.  Knows to call if he has any problems of concerns.  CAC card given to him with instructions.

## 2014-11-03 DIAGNOSIS — C9590 Leukemia, unspecified not having achieved remission: Secondary | ICD-10-CM | POA: Diagnosis not present

## 2014-11-03 DIAGNOSIS — I517 Cardiomegaly: Secondary | ICD-10-CM | POA: Diagnosis not present

## 2014-11-03 DIAGNOSIS — R05 Cough: Secondary | ICD-10-CM | POA: Diagnosis not present

## 2014-11-03 DIAGNOSIS — J9811 Atelectasis: Secondary | ICD-10-CM | POA: Diagnosis not present

## 2014-11-03 DIAGNOSIS — J189 Pneumonia, unspecified organism: Secondary | ICD-10-CM | POA: Diagnosis not present

## 2014-11-10 DIAGNOSIS — J45909 Unspecified asthma, uncomplicated: Secondary | ICD-10-CM | POA: Diagnosis not present

## 2014-11-12 ENCOUNTER — Ambulatory Visit (INDEPENDENT_AMBULATORY_CARE_PROVIDER_SITE_OTHER): Payer: Medicare Other | Admitting: Cardiology

## 2014-11-12 ENCOUNTER — Encounter: Payer: Self-pay | Admitting: Cardiology

## 2014-11-12 VITALS — BP 120/62 | HR 81 | Ht 76.0 in | Wt 288.8 lb

## 2014-11-12 DIAGNOSIS — I2510 Atherosclerotic heart disease of native coronary artery without angina pectoris: Secondary | ICD-10-CM | POA: Diagnosis not present

## 2014-11-12 DIAGNOSIS — E669 Obesity, unspecified: Secondary | ICD-10-CM

## 2014-11-12 DIAGNOSIS — I10 Essential (primary) hypertension: Secondary | ICD-10-CM

## 2014-11-12 NOTE — Progress Notes (Signed)
[place] [address], [address], [place]  [address] Phone: [phone] Fax:  [phone]  Date:  11/12/2014   ID:  Storm, Brandsma [birth year], MRN [id number]  PCP:  Wenda Low, MD   History of Present Illness: ABDULREHMAN INGLETT is a 74 y.o. male with CAD. He had been complaining of right shoulder and right upper chest discomfort with walking and occasional shortness of breath with no associated diaphoresis or nausea. He has a history of diabetes, hypertension and obesity. Dr. Lysle Rubens ordered a nuclear stress test. Stress test was performed and demonstrated evidence of mild ischemia in the basal inferolateral to mid inferolateral region. Felt to be a moderate risk myocardial perfusion study. He also had during exercise a small/brief run of possible ventricular tachycardia that was slow and was noted to have significant dyspnea on exertion.  Catheterization demonstrated mid LAD 85% stenosis immediately after bifurcation of a large diagonal branch. Distal to this lesion approximately 15 mm was another 95% stenosis, calcified. In the very distal regions of the LAD there was diffuse disease up to 80%. The large diagonal branch mentioned above also demonstrated 2 focal areas of 85% stenosis of the proximal/mid vessel. Other vessels demonstrated minor luminal irregularities. Ejection fraction 55%. Discussed with Dr. Consuello Closs.   After cardiac catheterization I started him on metoprolol as well as isosorbide. He states that with medication, he has not been feeling any further symptoms. No exertional discomfort at this point. He will monitor this closely. We did discuss the risks of taking Viagra with isosorbide. For the first 3 days he had a headache with isosorbide, this is improved.  On 03/15/13-he is interested in perhaps coming off of the isosorbide because of the limitations with Viagra. I'm willing to allow him to trial being off of this medication. Of course if he develops  anginal-like symptoms, I would like for him to be back on isosorbide. We have discussed at length. He is going to [facility]  08/28/13 - Left ankle swelling after driving from [place]. Stopped and walked around. Some pain in right arm when walking up stairs. His isosorbide remains on his drug list. He believes he is still taking this medication.  05/16/14-he is now once again complaining of symptoms of right-sided arm/shoulder discomfort, achiness when exerting himself,  walking up stairs, any other vigorous activity. It is relieved with rest. When walking, he tries to raise his arm above his head to change or less and this symptom but usually it is rest that seems to alleviate the discomfort.  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 He does have associated anemia with this and likely this is contributing to his symptoms. He still will feel right shoulder/arm pain with some physical activity however. He is taking his medicine. The increase in the Toprol seems to have helped.  11/12/14 - PNA in ER in [place]. Last round of chemo. Taking abx. Saw Dr. Laurann Montana. Inh as well.   Wt Readings from Last 3 Encounters:  11/12/14 288 lb 12.8 oz (130.999 kg)  10/21/14 304 lb 8 oz (138.12 kg)  09/16/14 297 lb 6.4 oz (134.9 kg)     Past Medical History  Diagnosis Date  . Lymphocytosis   . Hypertension   . Obesity   . Hypertriglyceridemia   . 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 Diabetes mellitus, type II     Past Surgical  History  Procedure Laterality Date  . Foot surgery Right   . Vericose vein stripping    . Nasal sinus surgery      Current Outpatient Prescriptions  Medication Sig Dispense Refill  . acyclovir (ZOVIRAX) 400 MG tablet Take 1 tablet (400 mg total) by mouth daily. 30 tablet 5  . aspirin 81 MG tablet Take 81 mg by mouth daily. Every other day    . atorvastatin (LIPITOR) 40 MG  tablet Take 40 mg by mouth daily.    . fish oil-omega-3 fatty acids 1000 MG capsule Take 2 g by mouth daily.     . isosorbide mononitrate (IMDUR) 30 MG 24 hr tablet Take 30 mg by mouth daily.    Marland Kitchen levofloxacin (LEVAQUIN) 750 MG tablet Take 750 mg by mouth daily.  0  . lidocaine-prilocaine (EMLA) cream Apply 1 application topically as needed. Apply to port 1 hour prior to chemo 30 g 1  . lisinopril-hydrochlorothiazide (PRINZIDE,ZESTORETIC) 10-12.5 MG per tablet Take 1 tablet by mouth daily.    . metFORMIN (GLUCOPHAGE) 1000 MG tablet Take 1,000 mg by mouth 2 (two) times daily with a meal.    . metoprolol succinate (TOPROL-XL) 50 MG 24 hr tablet Take 1 tablet (50 mg total) by mouth daily. 90 tablet 3  . predniSONE (DELTASONE) 20 MG tablet Take 20 mg by mouth daily. Take 2 tablets by mouth daily     No current facility-administered medications for this visit.    Allergies:   No Known Allergies  Social History:  The patient  reports that he quit smoking about 26 years ago. He has never used smokeless tobacco. He reports that he drinks about 12.0 oz of alcohol per week.   ROS:  Please see the history of present illness.   No syncope, no bleeding, no orthopnea, no PND    PHYSICAL EXAM: VS:  BP 120/62 mmHg  Pulse 81  Ht 6\' 4"  (1.93 m)  Wt 288 lb 12.8 oz (130.999 kg)  BMI 35.17 kg/m2  SpO2 91% Well nourished, well developed, in no acute distress HEENT: normal Neck: no JVD Cardiac:  normal S1, S2; RRR; no murmur Lungs:  clear to auscultation bilaterally, no wheezing, rhonchi or rales Abd: soft, nontender, no hepatomegalyObese Ext: no edema Skin: warm and dry Neuro: no focal abnormalities noted  EKG:  05/16/14-Normal rhythm, rate 62 poor R wave progression, left axis deviation, no other changes  Cardiac catheterization 01/17/13: Stress test moderate risk with inferolateral mid to apex ischemia. 1. Mid LAD disease immediately after the bifurcation of a large diagonal branch of 85% stenosis,  then 15 mm distal to that, 95% calcified stenosis, then distal LAD diffuse disease with lesions of up to 80-90%. Nitroglycerin administered with mild increase in caliber of the distal vessel. There is also small caliber posterior lateral branch 90% proximal stenosis, collateral blood flow. There is small caliber obtuse marginal branch stenosis/occlusion as well with left to left collaterals filling retrograde.  2. Normal left ventricular systolic function. LVEDP 6 mmHg. Ejection fraction 55%. No significant wall motion abnormalities. Have discussed this with Dr. Irish Lack.   ASSESSMENT AND PLAN:  1. Coronary artery disease-continues to be stable with this. challenging to discern whether or not his right-sided arm symptoms are musculoskeletal versus anginal equivalent. I'm unable to reduplicate the discomfort with arm motion or palpation. He states clearly previously that he obtains this discomfort when walking up a hill, stairs in his house, any other exertional activity. At first, when starting metoprolol this  seemed to help. I  increased his metoprolol to 50 mg and this helped a little bit.. Increasing his isosorbide to 60 mg from 30. Aggressive secondary prevention. Disease as described above. Reviewed. I discussed the case previously with Dr. Irish Lack. overall he says 5. I think the main thing is to continue with secondary prevention efforts such as weight loss, conditioning. Currently he is getting over pneumonia.  2. CLL -  Dr. Alvy Bimler. Likely contributing to his fatigue like symptoms. currently getting over pneumonia.  3. Angina-atypical right arm pain with motion. Could be anginal symptoms or perhaps musculoskeletal (not as convinced). 4. Obesity-continue with aggressive diet, exercise.  good job with weight loss currently.  5. Hyperlipidemia-continue with atorvastatin. LFTs normal in June.  Prior LDL 36. 6. Diabetes-Dr. Deforest Hoyles working on this. Medications reviewed. ACE inhibitor for renal  protection. Creatinine 1.1 in June 2014. 7. 6 month f/u.  Signed, [name], MD [facility]  11/12/2014 9:36 AM

## 2014-11-12 NOTE — Patient Instructions (Signed)
**Note De-identified  Obfuscation** Your physician recommends that you continue on your current medications as directed. Please refer to the Current Medication list given to you today.  Your physician wants you to follow-up in: 6 months. You will receive a reminder letter in the mail two months in advance. If you don't receive a letter, please call our office to schedule the follow-up appointment.  

## 2014-11-18 ENCOUNTER — Telehealth: Payer: Self-pay | Admitting: Hematology and Oncology

## 2014-11-18 ENCOUNTER — Ambulatory Visit (HOSPITAL_BASED_OUTPATIENT_CLINIC_OR_DEPARTMENT_OTHER): Payer: Medicare Other

## 2014-11-18 ENCOUNTER — Ambulatory Visit (HOSPITAL_BASED_OUTPATIENT_CLINIC_OR_DEPARTMENT_OTHER): Payer: Medicare Other | Admitting: Hematology and Oncology

## 2014-11-18 ENCOUNTER — Encounter: Payer: Self-pay | Admitting: Hematology and Oncology

## 2014-11-18 ENCOUNTER — Other Ambulatory Visit (HOSPITAL_BASED_OUTPATIENT_CLINIC_OR_DEPARTMENT_OTHER): Payer: Medicare Other

## 2014-11-18 VITALS — BP 141/59 | HR 67 | Temp 98.2°F | Resp 18 | Ht 76.0 in | Wt 293.0 lb

## 2014-11-18 DIAGNOSIS — C9110 Chronic lymphocytic leukemia of B-cell type not having achieved remission: Secondary | ICD-10-CM

## 2014-11-18 DIAGNOSIS — R05 Cough: Secondary | ICD-10-CM

## 2014-11-18 DIAGNOSIS — Z5112 Encounter for antineoplastic immunotherapy: Secondary | ICD-10-CM

## 2014-11-18 DIAGNOSIS — J069 Acute upper respiratory infection, unspecified: Secondary | ICD-10-CM | POA: Insufficient documentation

## 2014-11-18 LAB — CBC & DIFF AND RETIC
BASO%: 0.2 % (ref 0.0–2.0)
Basophils Absolute: 0 10*3/uL (ref 0.0–0.1)
EOS%: 1.7 % (ref 0.0–7.0)
Eosinophils Absolute: 0.1 10*3/uL (ref 0.0–0.5)
HCT: 39.3 % (ref 38.4–49.9)
HGB: 13.5 g/dL (ref 13.0–17.1)
Immature Retic Fract: 5.1 % (ref 3.00–10.60)
LYMPH%: 21.8 % (ref 14.0–49.0)
MCH: 29.9 pg (ref 27.2–33.4)
MCHC: 34.4 g/dL (ref 32.0–36.0)
MCV: 87.1 fL (ref 79.3–98.0)
MONO#: 0.7 10*3/uL (ref 0.1–0.9)
MONO%: 11.8 % (ref 0.0–14.0)
NEUT#: 3.8 10*3/uL (ref 1.5–6.5)
NEUT%: 64.5 % (ref 39.0–75.0)
Platelets: 166 10*3/uL (ref 140–400)
RBC: 4.51 10*6/uL (ref 4.20–5.82)
RDW: 14.6 % (ref 11.0–14.6)
Retic %: 2.54 % — ABNORMAL HIGH (ref 0.80–1.80)
Retic Ct Abs: 114.55 10*3/uL — ABNORMAL HIGH (ref 34.80–93.90)
WBC: 5.9 10*3/uL (ref 4.0–10.3)
lymph#: 1.3 10*3/uL (ref 0.9–3.3)

## 2014-11-18 LAB — COMPREHENSIVE METABOLIC PANEL (CC13)
ALT: 14 U/L (ref 0–55)
AST: 21 U/L (ref 5–34)
Albumin: 3.6 g/dL (ref 3.5–5.0)
Alkaline Phosphatase: 59 U/L (ref 40–150)
Anion Gap: 11 mEq/L (ref 3–11)
BUN: 18.1 mg/dL (ref 7.0–26.0)
CO2: 27 mEq/L (ref 22–29)
Calcium: 9.3 mg/dL (ref 8.4–10.4)
Chloride: 103 mEq/L (ref 98–109)
Creatinine: 1.1 mg/dL (ref 0.7–1.3)
EGFR: 67 mL/min/{1.73_m2} — ABNORMAL LOW (ref >90)
Glucose: 132 mg/dl (ref 70–140)
Potassium: 3.7 mEq/L (ref 3.5–5.1)
Sodium: 141 mEq/L (ref 136–145)
Total Bilirubin: 0.66 mg/dL (ref 0.20–1.20)
Total Protein: 6.3 g/dL — ABNORMAL LOW (ref 6.4–8.3)

## 2014-11-18 MED ORDER — DEXAMETHASONE SODIUM PHOSPHATE 20 MG/5ML IJ SOLN
20.0000 mg | Freq: Once | INTRAMUSCULAR | Status: AC
  Administered 2014-11-18: 20 mg via INTRAVENOUS

## 2014-11-18 MED ORDER — ACETAMINOPHEN 325 MG PO TABS
ORAL_TABLET | ORAL | Status: AC
  Filled 2014-11-18: qty 2

## 2014-11-18 MED ORDER — SODIUM CHLORIDE 0.9 % IV SOLN
1000.0000 mg | Freq: Once | INTRAVENOUS | Status: AC
  Administered 2014-11-18: 1000 mg via INTRAVENOUS
  Filled 2014-11-18: qty 40

## 2014-11-18 MED ORDER — DIPHENHYDRAMINE HCL 50 MG/ML IJ SOLN
INTRAMUSCULAR | Status: AC
  Filled 2014-11-18: qty 1

## 2014-11-18 MED ORDER — DIPHENHYDRAMINE HCL 50 MG/ML IJ SOLN
50.0000 mg | Freq: Once | INTRAMUSCULAR | Status: AC
  Administered 2014-11-18: 50 mg via INTRAVENOUS

## 2014-11-18 MED ORDER — SODIUM CHLORIDE 0.9 % IJ SOLN
10.0000 mL | INTRAMUSCULAR | Status: DC | PRN
  Administered 2014-11-18: 10 mL
  Filled 2014-11-18: qty 10

## 2014-11-18 MED ORDER — HEPARIN SOD (PORK) LOCK FLUSH 100 UNIT/ML IV SOLN
500.0000 [IU] | Freq: Once | INTRAVENOUS | Status: AC | PRN
  Administered 2014-11-18: 500 [IU]
  Filled 2014-11-18: qty 5

## 2014-11-18 MED ORDER — DEXAMETHASONE SODIUM PHOSPHATE 20 MG/5ML IJ SOLN
INTRAMUSCULAR | Status: AC
  Filled 2014-11-18: qty 5

## 2014-11-18 MED ORDER — ACETAMINOPHEN 325 MG PO TABS
650.0000 mg | ORAL_TABLET | Freq: Once | ORAL | Status: AC
  Administered 2014-11-18: 650 mg via ORAL

## 2014-11-18 MED ORDER — SODIUM CHLORIDE 0.9 % IV SOLN
Freq: Once | INTRAVENOUS | Status: AC
  Administered 2014-11-18: 10:00:00 via INTRAVENOUS

## 2014-11-18 NOTE — Progress Notes (Signed)
[facility] OFFICE PROGRESS NOTE  Patient Care Team: Wenda Low, MD as PCP - General (Internal Medicine) [name], MD as Attending Physician (Cardiology)  SUMMARY OF ONCOLOGIC HISTORY:   CLL (chronic lymphocytic leukemia)   09/10/2013 Initial Diagnosis CLL (chronic lymphocytic leukemia)   12/26/2013 Pathology Results FISH analysis was normal.   06/17/2014 Procedure The patient has placement of Infuse-a-Port.   06/18/2014 Imaging CT scan of the chest, abdomen and pelvis showed diffuse lymphadenopathy and splenomegaly.   06/19/2014 Bone Marrow Biopsy Bone marrow aspirate and biopsy show CLL.   06/24/2014 - 11/18/2014 Chemotherapy He received 6 cycles of Obinutuzumab and chlorambucil   09/15/2014 Imaging Repeat CT scan of the chest, abdomen and pelvis show greater than 50% reduction in lymphadenopathy and splenomegaly    INTERVAL HISTORY: Please see below for problem oriented charting. He went to [place] last month and developed pneumonia. He was treated with a course of levofloxacin and prednisone with improvement of his function. He has persistent mild, nonproductive cough.  REVIEW OF SYSTEMS:   Constitutional: Denies fevers, chills or abnormal weight loss Eyes: Denies blurriness of vision Ears, nose, mouth, throat, and face: Denies mucositis or sore throat Cardiovascular: Denies palpitation, chest discomfort or lower extremity swelling Gastrointestinal:  Denies nausea, heartburn or change in bowel habits Skin: Denies abnormal skin rashes Lymphatics: Denies new lymphadenopathy or easy bruising Neurological:Denies numbness, tingling or new weaknesses Behavioral/Psych: Mood is stable, no new changes  All other systems were reviewed with the patient and are negative.  I have reviewed the past medical history, past surgical history, social history and family history with the patient and they are unchanged from previous note.  ALLERGIES:  has No Known  Allergies.  MEDICATIONS:  Current Outpatient Prescriptions  Medication Sig Dispense Refill  . acyclovir (ZOVIRAX) 400 MG tablet Take 1 tablet (400 mg total) by mouth daily. 30 tablet 5  . aspirin 81 MG tablet Take 81 mg by mouth daily. Every other day    . atorvastatin (LIPITOR) 40 MG tablet Take 40 mg by mouth daily.    . fish oil-omega-3 fatty acids 1000 MG capsule Take 2 g by mouth daily.     . isosorbide mononitrate (IMDUR) 30 MG 24 hr tablet Take 30 mg by mouth daily.    Marland Kitchen lidocaine-prilocaine (EMLA) cream Apply 1 application topically as needed. Apply to port 1 hour prior to chemo 30 g 1  . lisinopril-hydrochlorothiazide (PRINZIDE,ZESTORETIC) 10-12.5 MG per tablet Take 1 tablet by mouth daily.    . metFORMIN (GLUCOPHAGE) 1000 MG tablet Take 1,000 mg by mouth 2 (two) times daily with a meal.    . metoprolol succinate (TOPROL-XL) 50 MG 24 hr tablet Take 1 tablet (50 mg total) by mouth daily. 90 tablet 3  . predniSONE (DELTASONE) 20 MG tablet Take 20 mg by mouth daily. Take 2 tablets by mouth daily     No current facility-administered medications for this visit.    PHYSICAL EXAMINATION: ECOG PERFORMANCE STATUS: 1 - Symptomatic but completely ambulatory  Filed Vitals:   11/18/14 0835  BP: 141/59  Pulse: 67  Temp: 98.2 F (36.8 C)  Resp: 18   Filed Weights   11/18/14 0835  Weight: 293 lb (132.904 kg)    GENERAL:alert, no distress and comfortable SKIN: skin color, texture, turgor are normal, no rashes or significant lesions EYES: normal, Conjunctiva are pink and non-injected, sclera clear OROPHARYNX:no exudate, no erythema and lips, buccal mucosa, and tongue normal  NECK: supple, thyroid normal size, non-tender,  without nodularity LYMPH:  no palpable lymphadenopathy in the cervical, axillary or inguinal LUNGS: clear to auscultation and percussion with normal breathing effort HEART: regular rate & rhythm and no murmurs and no lower extremity edema ABDOMEN:abdomen soft,  non-tender and normal bowel sounds Musculoskeletal:no cyanosis of digits and no clubbing  NEURO: alert & oriented x 3 with fluent speech, no focal motor/sensory deficits  LABORATORY DATA:  I have reviewed the data as listed    Component Value Date/Time   NA 136 10/21/2014 0825   K 4.3 10/21/2014 0825   CL 101 03/11/2013 1031   CO2 24 10/21/2014 0825   GLUCOSE 158* 10/21/2014 0825   GLUCOSE 173* 03/11/2013 1031   GLUCOSE 128* 01/17/2013 1040   BUN 14.3 10/21/2014 0825   CREATININE 1.2 10/21/2014 0825   CALCIUM 9.2 10/21/2014 0825   PROT 6.8 10/21/2014 0825   ALBUMIN 3.8 10/21/2014 0825   AST 13 10/21/2014 0825   ALT 8 10/21/2014 0825   ALKPHOS 56 10/21/2014 0825   BILITOT 1.22* 10/21/2014 0825    No results found for: SPEP, UPEP  Lab Results  Component Value Date   WBC 5.9 11/18/2014   NEUTROABS 3.8 11/18/2014   HGB 13.5 11/18/2014   HCT 39.3 11/18/2014   MCV 87.1 11/18/2014   PLT 166 11/18/2014      Chemistry      Component Value Date/Time   NA 136 10/21/2014 0825   K 4.3 10/21/2014 0825   CL 101 03/11/2013 1031   CO2 24 10/21/2014 0825   BUN 14.3 10/21/2014 0825   CREATININE 1.2 10/21/2014 0825      Component Value Date/Time   CALCIUM 9.2 10/21/2014 0825   ALKPHOS 56 10/21/2014 0825   AST 13 10/21/2014 0825   ALT 8 10/21/2014 0825   BILITOT 1.22* 10/21/2014 0825     ASSESSMENT & PLAN:  CLL (chronic lymphocytic leukemia) He had mild infusion reaction on cycle 1 day 1 of treatment. Since then he tolerated treatment well. I recommend we continue same treatment without dose adjustment. CT scan from 09/15/2014 show excellent response to treatment. He will received his final treatment today. I will order CT scan of the chest, abdomen and pelvis with contrast next month to assess response to treatment. After that, I plan to discontinue his port. I reminded him to not take lisinopril and metformin the day before, day of and day after CT scan to reduce risk of  contrast nephropathy.     Recent upper respiratory tract infection He had recent upper respiratory tract infection. He has fully recovered from this. Currently, he is not neutropenic. I will proceed with treatment without dose adjustment or delay. He will be covered with Neulasta tomorrow to prevent infection.    Orders Placed This Encounter  Procedures  . CT Chest W Contrast    Standing Status: Future     Number of Occurrences:      Standing Expiration Date: 01/18/2016    Order Specific Question:  Reason for Exam (SYMPTOM  OR DIAGNOSIS REQUIRED)    Answer:  staging CLL< assess response to Rx    Order Specific Question:  Preferred imaging location?    Answer:  [facility]  . CT Abdomen Pelvis W Contrast    Standing Status: Future     Number of Occurrences:      Standing Expiration Date: 02/18/2016    Order Specific Question:  Reason for Exam (SYMPTOM  OR DIAGNOSIS REQUIRED)    Answer:  staging CLL<  assess response to Rx    Order Specific Question:  Preferred imaging location?    Answer:  [facility]   All questions were answered. The patient knows to call the clinic with any problems, questions or concerns. No barriers to learning was detected. I spent 25 minutes counseling the patient face to face. The total time spent in the appointment was 30 minutes and more than 50% was on counseling and review of test results     [facility], [place], MD 11/18/2014 8:52 AM

## 2014-11-18 NOTE — Assessment & Plan Note (Addendum)
He had recent upper respiratory tract infection. He has fully recovered from this. Currently, he is not neutropenic. I will proceed with treatment without dose adjustment or delay. He will be covered with Neulasta tomorrow to prevent infection.

## 2014-11-18 NOTE — Patient Instructions (Signed)
[facility] Discharge Instructions for Patients Receiving Chemotherapy  Today you received the following chemotherapy agents Gayzava  To help prevent nausea and vomiting after your treatment, we encourage you to take your nausea medication as directed/prescribed   If you develop nausea and vomiting that is not controlled by your nausea medication, call the clinic.   BELOW ARE SYMPTOMS THAT SHOULD BE REPORTED IMMEDIATELY:  *FEVER GREATER THAN 100.5 F  *CHILLS WITH OR WITHOUT FEVER  NAUSEA AND VOMITING THAT IS NOT CONTROLLED WITH YOUR NAUSEA MEDICATION  *UNUSUAL SHORTNESS OF BREATH  *UNUSUAL BRUISING OR BLEEDING  TENDERNESS IN MOUTH AND THROAT WITH OR WITHOUT PRESENCE OF ULCERS  *URINARY PROBLEMS  *BOWEL PROBLEMS  UNUSUAL RASH Items with * indicate a potential emergency and should be followed up as soon as possible.  Feel free to call the clinic you have any questions or concerns. The clinic phone number is [phone].

## 2014-11-18 NOTE — Telephone Encounter (Signed)
gv and prnted appt sched and avs for pt for March and April....gv pt barium

## 2014-11-18 NOTE — Assessment & Plan Note (Signed)
He had mild infusion reaction on cycle 1 day 1 of treatment. Since then he tolerated treatment well. I recommend we continue same treatment without dose adjustment. CT scan from 09/15/2014 show excellent response to treatment. He will received his final treatment today. I will order CT scan of the chest, abdomen and pelvis with contrast next month to assess response to treatment. After that, I plan to discontinue his port. I reminded him to not take lisinopril and metformin the day before, day of and day after CT scan to reduce risk of contrast nephropathy.

## 2014-11-19 ENCOUNTER — Ambulatory Visit (HOSPITAL_BASED_OUTPATIENT_CLINIC_OR_DEPARTMENT_OTHER): Payer: Medicare Other

## 2014-11-19 DIAGNOSIS — Z5189 Encounter for other specified aftercare: Secondary | ICD-10-CM

## 2014-11-19 DIAGNOSIS — C9110 Chronic lymphocytic leukemia of B-cell type not having achieved remission: Secondary | ICD-10-CM | POA: Diagnosis not present

## 2014-11-19 MED ORDER — PEGFILGRASTIM INJECTION 6 MG/0.6ML
6.0000 mg | Freq: Once | SUBCUTANEOUS | Status: AC
  Administered 2014-11-19: 6 mg via SUBCUTANEOUS
  Filled 2014-11-19: qty 0.6

## 2014-12-19 ENCOUNTER — Ambulatory Visit (HOSPITAL_BASED_OUTPATIENT_CLINIC_OR_DEPARTMENT_OTHER): Payer: Medicare Other

## 2014-12-19 ENCOUNTER — Ambulatory Visit (HOSPITAL_COMMUNITY)
Admission: RE | Admit: 2014-12-19 | Discharge: 2014-12-19 | Disposition: A | Discharge status: Home / Self Care | Payer: Medicare Other | Source: Ambulatory Visit | Attending: Hematology and Oncology | Admitting: Hematology and Oncology

## 2014-12-19 ENCOUNTER — Other Ambulatory Visit (HOSPITAL_BASED_OUTPATIENT_CLINIC_OR_DEPARTMENT_OTHER): Payer: Medicare Other

## 2014-12-19 VITALS — BP 124/63 | HR 58 | Temp 98.1°F | Resp 20

## 2014-12-19 DIAGNOSIS — K5730 Diverticulosis of large intestine without perforation or abscess without bleeding: Secondary | ICD-10-CM | POA: Diagnosis not present

## 2014-12-19 DIAGNOSIS — C9110 Chronic lymphocytic leukemia of B-cell type not having achieved remission: Secondary | ICD-10-CM

## 2014-12-19 DIAGNOSIS — N200 Calculus of kidney: Secondary | ICD-10-CM | POA: Diagnosis not present

## 2014-12-19 DIAGNOSIS — Z95828 Presence of other vascular implants and grafts: Secondary | ICD-10-CM

## 2014-12-19 DIAGNOSIS — R911 Solitary pulmonary nodule: Secondary | ICD-10-CM | POA: Diagnosis not present

## 2014-12-19 LAB — CBC & DIFF AND RETIC
BASO%: 0.3 % (ref 0.0–2.0)
Basophils Absolute: 0 10*3/uL (ref 0.0–0.1)
EOS%: 1 % (ref 0.0–7.0)
Eosinophils Absolute: 0 10*3/uL (ref 0.0–0.5)
HCT: 36.9 % — ABNORMAL LOW (ref 38.4–49.9)
HGB: 12.7 g/dL — ABNORMAL LOW (ref 13.0–17.1)
Immature Retic Fract: 8.7 % (ref 3.00–10.60)
LYMPH%: 34.4 % (ref 14.0–49.0)
MCH: 30 pg (ref 27.2–33.4)
MCHC: 34.4 g/dL (ref 32.0–36.0)
MCV: 87.2 fL (ref 79.3–98.0)
MONO#: 0.7 10*3/uL (ref 0.1–0.9)
MONO%: 22.5 % — ABNORMAL HIGH (ref 0.0–14.0)
NEUT#: 1.3 10*3/uL — ABNORMAL LOW (ref 1.5–6.5)
NEUT%: 41.8 % (ref 39.0–75.0)
Platelets: 156 10*3/uL (ref 140–400)
RBC: 4.23 10*6/uL (ref 4.20–5.82)
RDW: 16.5 % — ABNORMAL HIGH (ref 11.0–14.6)
Retic %: 2.65 % — ABNORMAL HIGH (ref 0.80–1.80)
Retic Ct Abs: 112.1 10*3/uL — ABNORMAL HIGH (ref 34.80–93.90)
WBC: 3 10*3/uL — ABNORMAL LOW (ref 4.0–10.3)
lymph#: 1 10*3/uL (ref 0.9–3.3)

## 2014-12-19 LAB — COMPREHENSIVE METABOLIC PANEL (CC13)
ALT: 15 U/L (ref 0–55)
AST: 20 U/L (ref 5–34)
Albumin: 3.7 g/dL (ref 3.5–5.0)
Alkaline Phosphatase: 60 U/L (ref 40–150)
Anion Gap: 13 mEq/L — ABNORMAL HIGH (ref 3–11)
BUN: 15.3 mg/dL (ref 7.0–26.0)
CO2: 21 mEq/L — ABNORMAL LOW (ref 22–29)
Calcium: 9.2 mg/dL (ref 8.4–10.4)
Chloride: 105 mEq/L (ref 98–109)
Creatinine: 1 mg/dL (ref 0.7–1.3)
EGFR: 74 mL/min/{1.73_m2} — ABNORMAL LOW (ref >90)
Glucose: 146 mg/dl — ABNORMAL HIGH (ref 70–140)
Potassium: 4.1 mEq/L (ref 3.5–5.1)
Sodium: 139 mEq/L (ref 136–145)
Total Bilirubin: 0.77 mg/dL (ref 0.20–1.20)
Total Protein: 6.5 g/dL (ref 6.4–8.3)

## 2014-12-19 MED ORDER — HEPARIN SOD (PORK) LOCK FLUSH 100 UNIT/ML IV SOLN
500.0000 [IU] | Freq: Once | INTRAVENOUS | Status: AC
  Administered 2014-12-19: 500 [IU] via INTRAVENOUS
  Filled 2014-12-19: qty 5

## 2014-12-19 MED ORDER — SODIUM CHLORIDE 0.9 % IJ SOLN
10.0000 mL | INTRAMUSCULAR | Status: DC | PRN
  Administered 2014-12-19: 10 mL via INTRAVENOUS
  Filled 2014-12-19: qty 10

## 2014-12-19 MED ORDER — IOHEXOL 300 MG/ML  SOLN
100.0000 mL | Freq: Once | INTRAMUSCULAR | Status: AC | PRN
  Administered 2014-12-19: 100 mL via INTRAVENOUS

## 2014-12-19 NOTE — Progress Notes (Signed)
Patient left accessed for CT scan.  Blood return noted.

## 2014-12-19 NOTE — Patient Instructions (Signed)

## 2014-12-23 ENCOUNTER — Ambulatory Visit (HOSPITAL_BASED_OUTPATIENT_CLINIC_OR_DEPARTMENT_OTHER): Payer: Medicare Other | Admitting: Hematology and Oncology

## 2014-12-23 ENCOUNTER — Telehealth: Payer: Self-pay | Admitting: Hematology and Oncology

## 2014-12-23 ENCOUNTER — Encounter: Payer: Self-pay | Admitting: Hematology and Oncology

## 2014-12-23 VITALS — BP 138/55 | HR 66 | Temp 98.5°F | Resp 18 | Ht 76.0 in | Wt 295.7 lb

## 2014-12-23 DIAGNOSIS — D72819 Decreased white blood cell count, unspecified: Secondary | ICD-10-CM

## 2014-12-23 DIAGNOSIS — D630 Anemia in neoplastic disease: Secondary | ICD-10-CM | POA: Diagnosis not present

## 2014-12-23 DIAGNOSIS — C9110 Chronic lymphocytic leukemia of B-cell type not having achieved remission: Secondary | ICD-10-CM | POA: Diagnosis not present

## 2014-12-23 DIAGNOSIS — D701 Agranulocytosis secondary to cancer chemotherapy: Secondary | ICD-10-CM

## 2014-12-23 DIAGNOSIS — T451X5A Adverse effect of antineoplastic and immunosuppressive drugs, initial encounter: Secondary | ICD-10-CM

## 2014-12-23 NOTE — Assessment & Plan Note (Signed)
He had completed all treatment and have complete response. I will get the port removed and I plan to see him back in 6 months with repeat history, physical examination and blood work.

## 2014-12-23 NOTE — Progress Notes (Signed)
[facility] OFF[facility] PROGRESS NOTE  Patient Care Team: Wenda Low, MD as PCP - General (Internal Medicine) Jerline Pain, MD as Attending Physician (Cardiology)  SUMMARY OF ONCOLOGIC HISTORY:   CLL (chronic lymphocytic leukemia)   09/10/2013 Initial Diagnosis CLL (chronic lymphocytic leukemia)   12/26/2013 Pathology Results FISH analysis was normal.   06/17/2014 Procedure The patient has placement of Infuse-a-Port.   06/18/2014 Imaging CT scan of the chest, abdomen and pelvis showed diffuse lymphadenopathy and splenomegaly.   06/19/2014 Bone Marrow Biopsy Bone marrow aspirate and biopsy show CLL.   06/24/2014 - 11/18/2014 Chemotherapy He received 6 cycles of Obinutuzumab and chlorambucil   09/15/2014 Imaging Repeat CT scan of the chest, abdomen and pelvis show greater than 50% reduction in lymphadenopathy and splenomegaly   12/19/2014 Imaging Repeat CT scan showed complete resolution of lymphadenopathy and splenomegaly.    INTERVAL HISTORY: Please see below for problem oriented charting. He returns to follow-up on test results. He has no new complaints.  REVIEW OF SYSTEMS:   Constitutional: Denies fevers, chills or abnormal weight loss Eyes: Denies blurriness of vision Ears, nose, mouth, throat, and face: Denies mucositis or sore throat Respiratory: Denies cough, dyspnea or wheezes Cardiovascular: Denies palpitation, chest discomfort or lower extremity swelling Gastrointestinal:  Denies nausea, heartburn or change in bowel habits Skin: Denies abnormal skin rashes Lymphatics: Denies new lymphadenopathy or easy bruising Neurological:Denies numbness, tingling or new weaknesses Behavioral/Psych: Mood is stable, no new changes  All other systems were reviewed with the patient and are negative.  I have reviewed the past medical history, past surgical history, social history and family history with the patient and they are unchanged from previous note.  ALLERGIES:  has No Known  Allergies.  MEDICATIONS:  Current Outpatient Prescriptions  Medication Sig Dispense Refill  . aspirin 81 MG tablet Take 81 mg by mouth daily. Every other day    . atorvastatin (LIPITOR) 40 MG tablet Take 40 mg by mouth daily.    . fish oil-omega-3 fatty acids 1000 MG capsule Take 2 g by mouth daily.     . isosorbide mononitrate (IMDUR) 30 MG 24 hr tablet Take 30 mg by mouth daily.    Marland Kitchen lidocaine-prilocaine (EMLA) cream Apply 1 application topically as needed. Apply to port 1 hour prior to chemo 30 g 1  . lisinopril-hydrochlorothiazide (PRINZIDE,ZESTORETIC) 10-12.5 MG per tablet Take 1 tablet by mouth daily.    . metFORMIN (GLUCOPHAGE) 1000 MG tablet Take 1,000 mg by mouth 2 (two) times daily with a meal.     No current facility-administered medications for this visit.    PHYSICAL EXAMINATION: ECOG PERFORMANCE STATUS: 0 - Asymptomatic  Filed Vitals:   12/23/14 0849  BP: 138/55  Pulse: 66  Temp: 98.5 F (36.9 C)  Resp: 18   Filed Weights   12/23/14 0849  Weight: 295 lb 11.2 oz (134.129 kg)    GENERAL:alert, no distress and comfortable SKIN: skin color, texture, turgor are normal, no rashes or significant lesions EYES: normal, Conjunctiva are pink and non-injected, sclera clear Musculoskeletal:no cyanosis of digits and no clubbing  NEURO: alert & oriented x 3 with fluent speech, no focal motor/sensory deficits  LABORATORY DATA:  I have reviewed the data as listed    Component Value Date/Time   NA 139 12/19/2014 0804   K 4.1 12/19/2014 0804   CL 101 03/11/2013 1031   CO2 21* 12/19/2014 0804   GLUCOSE 146* 12/19/2014 0804   GLUCOSE 173* 03/11/2013 1031   GLUCOSE 128*  01/17/2013 1040   BUN 15.3 12/19/2014 0804   CREATINE 1.0 12/19/2014 0804   CALCIUM 9.2 12/19/2014 0804   PROT 6.5 12/19/2014 0804   ALBUMIN 3.7 12/19/2014 0804   AST 20 12/19/2014 0804   ALT 15 12/19/2014 0804   ALKPHOS 60 12/19/2014 0804   BILITOT 0.77 12/19/2014 0804    No results found for:  SPEP, UPEP  Lab Results  Component Value Date   WBC 3.0* 12/19/2014   NEUTROABS 1.3* 12/19/2014   HGB 12.7* 12/19/2014   HCT 36.9* 12/19/2014   MCV 87.2 12/19/2014   PLT 156 12/19/2014      Chemistry      Component Value Date/Time   NA 139 12/19/2014 0804   K 4.1 12/19/2014 0804   CL 101 03/11/2013 1031   CO2 21* 12/19/2014 0804   BUN 15.3 12/19/2014 0804   CREATINE 1.0 12/19/2014 0804      Component Value Date/Time   CALCIUM 9.2 12/19/2014 0804   ALKPHOS 60 12/19/2014 0804   AST 20 12/19/2014 0804   ALT 15 12/19/2014 0804   BILITOT 0.77 12/19/2014 0804       RADIOGRAPHIC STUDIES: I reviewed the imaging study I have personally reviewed the radiological images as listed and agreed with the findings in the report.   ASSESSMENT & PLAN:  CLL (chronic lymphocytic leukemia) He had completed all treatment and have complete response. I will get the port removed and I plan to see him back in 6 months with repeat history, physical examination and blood work.   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 At present time, he does not require blood transfusion. We'll monitor his blood counts carefully.      Leukopenia due to antineoplastic chemotherapy This is due to recent treatment. He is not symptomatic. I will observe only.    Coronary atherosclerosis of native coronary artery He has risk factors for heart disease. I recommend he resume oral cardiac medications as recommended by his cardiologist. He denies recent signs and symptoms of chest pain or congestive heart failure.    Orders Placed This Encounter  Procedures  . IR Removal Tun Access W/ Port W/O FL    Standing Status: Future     Number of Occurrences:      Standing Expiration Date: 02/22/2016    Order Specific Question:  Reason for exam:    Answer:  remove port    Order Specific Question:  Preferred Imaging Location?    Answer:  [facility]  .  CBC with Differential/Platelet    Standing Status: Future     Number of Occurrences:      Standing Expiration Date: 01/27/2016  . Lactate dehydrogenase    Standing Status: Future     Number of Occurrences:      Standing Expiration Date: 01/27/2016   All questions were answered. The patient knows to call the clinic with any problems, questions or concerns. No barriers to learning was detected. I spent 15 minutes counseling the patient face to face. The total time spent in the appointment was 20 minutes and more than 50% was on counseling and review of test results     , , MD 12/23/2014 9:01 AM    

## 2014-12-23 NOTE — Assessment & Plan Note (Signed)
This is due to his underlying disease and recent treatment. At present time, he does not require blood transfusion. We'll monitor his blood counts carefully.

## 2014-12-23 NOTE — Assessment & Plan Note (Signed)
He has risk factors for heart disease. I recommend he resume oral cardiac medications as recommended by his cardiologist. He denies recent signs and symptoms of chest pain or congestive heart failure.

## 2014-12-23 NOTE — Telephone Encounter (Signed)
Gave avs & calendar for October. °

## 2014-12-23 NOTE — Assessment & Plan Note (Signed)
This is due to recent treatment. He is not symptomatic. I will observe only.

## 2014-12-25 ENCOUNTER — Other Ambulatory Visit: Payer: Self-pay | Admitting: Radiology

## 2014-12-26 ENCOUNTER — Other Ambulatory Visit: Payer: Self-pay | Admitting: Radiology

## 2014-12-29 ENCOUNTER — Ambulatory Visit (HOSPITAL_COMMUNITY)
Admission: RE | Admit: 2014-12-29 | Discharge: 2014-12-29 | Disposition: A | Discharge status: Home / Self Care | Payer: Medicare Other | Source: Ambulatory Visit | Attending: Interventional Radiology | Admitting: Interventional Radiology

## 2014-12-29 ENCOUNTER — Encounter (HOSPITAL_COMMUNITY): Payer: Self-pay

## 2014-12-29 ENCOUNTER — Ambulatory Visit (HOSPITAL_COMMUNITY)
Admission: RE | Admit: 2014-12-29 | Discharge: 2014-12-29 | Disposition: A | Discharge status: Home / Self Care | Payer: Medicare Other | Source: Ambulatory Visit | Attending: Hematology and Oncology | Admitting: Hematology and Oncology

## 2014-12-29 DIAGNOSIS — E781 Pure hyperglyceridemia: Secondary | ICD-10-CM | POA: Diagnosis not present

## 2014-12-29 DIAGNOSIS — Z9221 Personal history of antineoplastic chemotherapy: Secondary | ICD-10-CM | POA: Diagnosis not present

## 2014-12-29 DIAGNOSIS — E669 Obesity, unspecified: Secondary | ICD-10-CM | POA: Insufficient documentation

## 2014-12-29 DIAGNOSIS — Z7982 Long term (current) use of aspirin: Secondary | ICD-10-CM | POA: Diagnosis not present

## 2014-12-29 DIAGNOSIS — Z6836 Body mass index (BMI) 36.0-36.9, adult: Secondary | ICD-10-CM | POA: Diagnosis not present

## 2014-12-29 DIAGNOSIS — Z452 Encounter for adjustment and management of vascular access device: Secondary | ICD-10-CM | POA: Diagnosis not present

## 2014-12-29 DIAGNOSIS — Z856 Personal history of leukemia: Secondary | ICD-10-CM | POA: Insufficient documentation

## 2014-12-29 DIAGNOSIS — E119 Type 2 diabetes mellitus without complications: Secondary | ICD-10-CM | POA: Diagnosis not present

## 2014-12-29 DIAGNOSIS — Z79899 Other long term (current) drug therapy: Secondary | ICD-10-CM | POA: Insufficient documentation

## 2014-12-29 DIAGNOSIS — I10 Essential (primary) hypertension: Secondary | ICD-10-CM | POA: Insufficient documentation

## 2014-12-29 DIAGNOSIS — Z87891 Personal history of nicotine dependence: Secondary | ICD-10-CM | POA: Insufficient documentation

## 2014-12-29 DIAGNOSIS — C9110 Chronic lymphocytic leukemia of B-cell type not having achieved remission: Secondary | ICD-10-CM

## 2014-12-29 LAB — CBC
HCT: 39.7 % (ref 39.0–52.0)
Hemoglobin: 13.6 g/dL (ref 13.0–17.0)
MCH: 29.8 pg (ref 26.0–34.0)
MCHC: 34.3 g/dL (ref 30.0–36.0)
MCV: 86.9 fL (ref 78.0–100.0)
Platelets: 160 10*3/uL (ref 150–400)
RBC: 4.57 MIL/uL (ref 4.22–5.81)
RDW: 15.4 % (ref 11.5–15.5)
WBC: 4.1 10*3/uL (ref 4.0–10.5)

## 2014-12-29 LAB — PROTIME-INR
INR: 1 (ref 0.00–1.49)
Prothrombin Time: 13.3 seconds (ref 11.6–15.2)

## 2014-12-29 LAB — GLUCOSE, CAPILLARY: Glucose-Capillary: 133 mg/dL — ABNORMAL HIGH (ref 70–99)

## 2014-12-29 MED ORDER — LIDOCAINE-EPINEPHRINE 2 %-1:100000 IJ SOLN
INTRAMUSCULAR | Status: AC
  Filled 2014-12-29: qty 1

## 2014-12-29 MED ORDER — SODIUM CHLORIDE 0.9 % IV SOLN
INTRAVENOUS | Status: DC
  Administered 2014-12-29: 08:00:00 via INTRAVENOUS

## 2014-12-29 MED ORDER — DEXTROSE 5 % IV SOLN
3.0000 g | Freq: Once | INTRAVENOUS | Status: AC
  Administered 2014-12-29: 3 g via INTRAVENOUS
  Filled 2014-12-29: qty 3000

## 2014-12-29 NOTE — Procedures (Signed)
Successful removal of right anterior chest wall port-a-cath. No immediate post procedural complications.  

## 2014-12-29 NOTE — Discharge Instructions (Signed)
Incision Care °An incision is when a surgeon cuts into your body tissues. After surgery, the incision needs to be cared for properly to prevent infection.  °HOME CARE INSTRUCTIONS  °· Take all medicine as directed by your caregiver. Only take over-the-counter or prescription medicines for pain, discomfort, or fever as directed by your caregiver. °· Do not remove your bandage (dressing) or get your incision wet until your surgeon gives you permission. In the event that your dressing becomes wet, dirty, or starts to smell, change the dressing and call your surgeon for instructions as soon as possible. °· Take showers. Do not take tub baths, swim, or do anything that may soak the wound until it is healed. °· Resume your normal diet and activities as directed or allowed. °· Avoid lifting any weight until you are instructed otherwise. °· Use anti-itch antihistamine medicine as directed by your caregiver. The wound may itch when it is healing. Do not pick or scratch at the wound. °· Follow up with your caregiver for stitch (suture) or staple removal as directed. °· Drink enough fluids to keep your urine clear or pale yellow. °SEEK MEDICAL CARE IF:  °· You have redness, swelling, or increasing pain in the wound that is not controlled with medicine. °· You have drainage, blood, or pus coming from the wound that lasts longer than 1 day. °· You develop muscle aches, chills, or a general ill feeling. °· You notice a bad smell coming from the wound or dressing. °· Your wound edges separate after the sutures, staples, or skin adhesive strips have been removed. °· You develop persistent nausea or vomiting. °SEEK IMMEDIATE MEDICAL CARE IF:  °· You have a fever. °· You develop a rash. °· You develop dizzy episodes or faint while standing. °· You have difficulty breathing. °· You develop any reaction or side effects to medicine given. °MAKE SURE YOU:  °· Understand these instructions. °· Will watch your condition. °· Will get help  right away if you are not doing well or get worse. °Document Released: 03/25/2005 Document Revised: 11/28/2011 Document Reviewed: 10/30/2013 °Exit[employer]® Patient Information ©2015 [employer]. This information is not intended to replace advice given to you by your health care provider. Make sure you discuss any questions you have with your health care provider. ° ° °

## 2014-12-29 NOTE — H&P (Signed)
Chief Complaint: "I am here to have my port removed."  Referring Physician(s): Gorsuch,Ni  History of Present Illness: Derek [name] is a 74 y.o. male with CLL and has finished treatment. He had his right sided port placed 05/2014, no difficulty since placement and is scheduled today for port removal. He denies any chest pain, shortness of breath or palpitations. He denies any active signs of bleeding or excessive bruising. He denies any recent fever or chills. The patient denies any history of sleep apnea or chronic oxygen use.    Past Medical History  Diagnosis Date  . Lymphocytosis   . Hypertension   . Obesity   . Hypertriglyceridemia   . 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 Diabetes mellitus, type II     Past Surgical History  Procedure Laterality Date  . Foot surgery Right   . Vericose vein stripping    . Nasal sinus surgery      Allergies: Review of patient's allergies indicates no known allergies.  Medications: Prior to Admission medications   Medication Sig Start Date End Date Taking? Authorizing Provider  aspirin 81 MG tablet Take 81 mg by mouth every other day.    Yes Historical Provider, MD  atorvastatin (LIPITOR) 40 MG tablet Take 40 mg by mouth daily. 02/27/13  Yes Historical Provider, MD  fish oil-omega-3 fatty acids 1000 MG capsule Take 2 g by mouth daily.    Yes Historical Provider, MD  isosorbide mononitrate (IMDUR) 30 MG 24 hr tablet Take 30 mg by mouth every morning.    Yes Historical Provider, MD  lisinopril-hydrochlorothiazide (PRINZIDE,ZESTORETIC) 10-12.5 MG per tablet Take 1 tablet by mouth every morning.    Yes Historical Provider, MD  metFORMIN (GLUCOPHAGE) 1000 MG tablet Take 1,000 mg by mouth 2 (two) times daily with a meal.   Yes Historical Provider, MD  lidocaine-prilocaine (EMLA) cream Apply 1 application topically as needed. Apply to port 1 hour prior to chemo 06/20/14   [name], MD     Family History  Problem Relation Age of  Onset  . Heart attack Mother   . Cancer Father     liver    History   Social History  . Marital Status: Married    Spouse Name: N/A  . Number of Children: 3  . Years of Education: N/A   Occupational History  .      retied Audiological scientist; Materials engineer work    Social History Main Topics  . Smoking status: Former Smoker -- 3.00 packs/day for 20 years    Quit date: 09/19/1988  . Smokeless tobacco: Never Used  . Alcohol Use: 8.4 oz/week    14 Shots of liquor per week  . Drug Use: Not on file  . Sexual Activity: Not on file   Other Topics Concern  . None   Social History Narrative    Review of Systems: A 12 point ROS discussed and pertinent positives are indicated in the HPI above.  All other systems are negative.  Review of Systems  Vital Signs: BP 139/61 mmHg  Pulse 65  Temp(Src) 98.4 F (36.9 C) (Oral)  Resp 18  Ht 6\' 4"  (1.93 m)  Wt 295 lb (133.811 kg)  BMI 35.92 kg/m2  SpO2 100%  Physical Exam  Constitutional: He is oriented to person, place, and time. No distress.  HENT:  Head: Normocephalic and atraumatic.  Neck: No tracheal deviation present.  Cardiovascular: Normal rate and regular rhythm.  Exam reveals no gallop and no  friction rub.   No murmur heard. Pulmonary/Chest: Effort normal and breath sounds normal. No respiratory distress. He has no wheezes. He has no rales.  Abdominal: Soft. Bowel sounds are normal. He exhibits no distension. There is no tenderness.  Neurological: He is alert and oriented to person, place, and time.  Skin: Skin is warm and dry. He is not diaphoretic.  Right sided port incision well healed.     Imaging: Ct Chest W Contrast  12/19/2014   CLINICAL DATA:  Subsequent encounter for CLL.  EXAM: CT CHEST, ABDOMEN, AND PELVIS WITH CONTRAST  TECHNIQUE: Multidetector CT imaging of the chest, abdomen and pelvis was performed following the standard protocol during bolus administration of intravenous contrast.  CONTRAST:  112mL OMNIPAQUE IOHEXOL 300  MG/ML  SOLN  COMPARISON:  09/15/2014.  FINDINGS: CT CHEST FINDINGS  Mediastinum/Nodes: Right Port-A-Cath tip is positioned in the distal SVC. No lymphadenopathy in the chest. Coronary artery calcification is noted. No pericardial effusion.  Lungs/Pleura: No edema or focal airspace consolidation. No pleural effusion. Scattered areas of micro nodularity are seen peripherally in the left upper and lower lobes and to a lesser degree in the right lower lobe.  Musculoskeletal: Bone windows reveal no worrisome lytic or sclerotic osseous lesions.  CT ABDOMEN AND PELVIS FINDINGS  Hepatobiliary: No focal abnormality within the liver parenchyma. Probable tiny cholesterol polyp in the gallbladder. No intrahepatic or extrahepatic biliary dilation.  Pancreas: No focal mass lesion. No dilatation of the main duct. No intraparenchymal cyst. No peripancreatic edema.  Spleen: No splenomegaly. No focal mass lesion.  Adrenals/Urinary Tract: No adrenal nodule or mass. 9 x 12 mm stone again identified in the right renal pelvis without obstruction. No stones in the left kidney. No evidence for hydroureter. Urinary bladder is unremarkable.  Stomach/Bowel: Stomach is nondistended. No gastric wall thickening. No evidence of outlet obstruction. Duodenum is normally positioned as is the ligament of Treitz. No small bowel wall thickening. No small bowel dilatation. Terminal ileum is normal. The appendix is normal. Diverticuli are seen scattered along the entire length of the colon without CT findings of diverticulitis.  Vascular/Lymphatic: No abdominal lymphadenopathy. No evidence for pelvic sidewall lymphadenopathy. There is abdominal aortic atherosclerosis without aneurysm.  Reproductive: Prostate gland and seminal vesicles are unremarkable.  Other: No intraperitoneal free fluid.  Musculoskeletal: Bone windows reveal no worrisome lytic or sclerotic osseous lesions.  IMPRESSION: 1. Stable exam. No lymphadenopathy in the chest, abdomen, or  pelvis. 2. Persistent 9 x 12 mm stone in the right renal pelvis without urinary obstruction. 3. Diverticulosis without diverticulitis. Imaging findings of potential clinical significance:  Aortic Atherosclerosis (ICD10-170.0)   Electronically Signed   By: Misty Stanley M.D.   On: 12/19/2014 10:54   Ct Abdomen Pelvis W Contrast  12/19/2014   CLINICAL DATA:  Subsequent encounter for CLL.  EXAM: CT CHEST, ABDOMEN, AND PELVIS WITH CONTRAST  TECHNIQUE: Multidetector CT imaging of the chest, abdomen and pelvis was performed following the standard protocol during bolus administration of intravenous contrast.  CONTRAST:  184mL OMNIPAQUE IOHEXOL 300 MG/ML  SOLN  COMPARISON:  09/15/2014.  FINDINGS: CT CHEST FINDINGS  Mediastinum/Nodes: Right Port-A-Cath tip is positioned in the distal SVC. No lymphadenopathy in the chest. Coronary artery calcification is noted. No pericardial effusion.  Lungs/Pleura: No edema or focal airspace consolidation. No pleural effusion. Scattered areas of micro nodularity are seen peripherally in the left upper and lower lobes and to a lesser degree in the right lower lobe.  Musculoskeletal: Bone windows reveal no  worrisome lytic or sclerotic osseous lesions.  CT ABDOMEN AND PELVIS FINDINGS  Hepatobiliary: No focal abnormality within the liver parenchyma. Probable tiny cholesterol polyp in the gallbladder. No intrahepatic or extrahepatic biliary dilation.  Pancreas: No focal mass lesion. No dilatation of the main duct. No intraparenchymal cyst. No peripancreatic edema.  Spleen: No splenomegaly. No focal mass lesion.  Adrenals/Urinary Tract: No adrenal nodule or mass. 9 x 12 mm stone again identified in the right renal pelvis without obstruction. No stones in the left kidney. No evidence for hydroureter. Urinary bladder is unremarkable.  Stomach/Bowel: Stomach is nondistended. No gastric wall thickening. No evidence of outlet obstruction. Duodenum is normally positioned as is the ligament of Treitz.  No small bowel wall thickening. No small bowel dilatation. Terminal ileum is normal. The appendix is normal. Diverticuli are seen scattered along the entire length of the colon without CT findings of diverticulitis.  Vascular/Lymphatic: No abdominal lymphadenopathy. No evidence for pelvic sidewall lymphadenopathy. There is abdominal aortic atherosclerosis without aneurysm.  Reproductive: Prostate gland and seminal vesicles are unremarkable.  Other: No intraperitoneal free fluid.  Musculoskeletal: Bone windows reveal no worrisome lytic or sclerotic osseous lesions.  IMPRESSION: 1. Stable exam. No lymphadenopathy in the chest, abdomen, or pelvis. 2. Persistent 9 x 12 mm stone in the right renal pelvis without urinary obstruction. 3. Diverticulosis without diverticulitis. Imaging findings of potential clinical significance:  Aortic Atherosclerosis (ICD10-170.0)   Electronically Signed   By: Misty Stanley M.D.   On: 12/19/2014 10:54    Labs:  CBC:  Recent Labs  09/24/14 1229 10/21/14 0825 11/18/14 0820 12/19/14 0804  WBC 2.8* 1.7* 5.9 3.0*  HGB 11.9* 12.8* 13.5 12.7*  HCT 34.0* 37.5* 39.3 36.9*  PLT 92* 116* 166 156    COAGS:  Recent Labs  06/17/14 1305  INR 1.16  APTT 23*    BMP:  Recent Labs  09/24/14 1229 10/21/14 0825 11/18/14 0821 12/19/14 0804  NA 138 136 141 139  K 4.0 4.3 3.7 4.1  CO2 26 24 27  21*  GLUCOSE 120 158* 132 146*  BUN 14.6 14.3 18.1 15.3  CALCIUM 9.4 9.2 9.3 9.2  CREATININE 1.1 1.2 1.1 1.0    LIVER FUNCTION TESTS:  Recent Labs  09/24/14 1229 10/21/14 0825 11/18/14 0821 12/19/14 0804  BILITOT 1.46* 1.22* 0.66 0.77  AST 16 13 21 20   ALT 12 8 14 15   ALKPHOS 64 56 59 60  PROT 6.7 6.8 6.3* 6.5  ALBUMIN 3.9 3.8 3.6 3.7    Assessment and Plan: CLL, treatment finished Port placed 05/2014, no difficulty since placement Scheduled today for port removal Patient ate this morning and denies any use of anticoagulation Will proceed with removal without  sedation Risks and Benefits discussed with the patient including, but not limited to bleeding, and infection.  All of the patient's questions were answered, patient is agreeable to proceed. Consent signed and in chart.   SignedHedy Jacob 12/29/2014, 8:25 AM

## 2015-01-09 DIAGNOSIS — N182 Chronic kidney disease, stage 2 (mild): Secondary | ICD-10-CM | POA: Diagnosis not present

## 2015-01-09 DIAGNOSIS — Z1389 Encounter for screening for other disorder: Secondary | ICD-10-CM | POA: Diagnosis not present

## 2015-01-09 DIAGNOSIS — C9110 Chronic lymphocytic leukemia of B-cell type not having achieved remission: Secondary | ICD-10-CM | POA: Diagnosis not present

## 2015-01-09 DIAGNOSIS — E780 Pure hypercholesterolemia: Secondary | ICD-10-CM | POA: Diagnosis not present

## 2015-01-09 DIAGNOSIS — E1122 Type 2 diabetes mellitus with diabetic chronic kidney disease: Secondary | ICD-10-CM | POA: Diagnosis not present

## 2015-01-09 DIAGNOSIS — R21 Rash and other nonspecific skin eruption: Secondary | ICD-10-CM | POA: Diagnosis not present

## 2015-01-09 DIAGNOSIS — N529 Male erectile dysfunction, unspecified: Secondary | ICD-10-CM | POA: Diagnosis not present

## 2015-01-09 DIAGNOSIS — I10 Essential (primary) hypertension: Secondary | ICD-10-CM | POA: Diagnosis not present

## 2015-01-09 DIAGNOSIS — Z0000 Encounter for general adult medical examination without abnormal findings: Secondary | ICD-10-CM | POA: Diagnosis not present

## 2015-01-09 DIAGNOSIS — Z125 Encounter for screening for malignant neoplasm of prostate: Secondary | ICD-10-CM | POA: Diagnosis not present

## 2015-01-09 DIAGNOSIS — I2510 Atherosclerotic heart disease of native coronary artery without angina pectoris: Secondary | ICD-10-CM | POA: Diagnosis not present

## 2015-01-28 DIAGNOSIS — L570 Actinic keratosis: Secondary | ICD-10-CM | POA: Diagnosis not present

## 2015-04-28 ENCOUNTER — Encounter: Payer: Self-pay | Admitting: *Deleted

## 2015-06-02 ENCOUNTER — Ambulatory Visit (INDEPENDENT_AMBULATORY_CARE_PROVIDER_SITE_OTHER): Payer: Medicare Other | Admitting: Cardiology

## 2015-06-02 ENCOUNTER — Encounter: Payer: Self-pay | Admitting: Cardiology

## 2015-06-02 VITALS — BP 130/59 | HR 62 | Ht 76.0 in | Wt 302.0 lb

## 2015-06-02 DIAGNOSIS — E785 Hyperlipidemia, unspecified: Secondary | ICD-10-CM | POA: Insufficient documentation

## 2015-06-02 DIAGNOSIS — I25118 Atherosclerotic heart disease of native coronary artery with other forms of angina pectoris: Secondary | ICD-10-CM

## 2015-06-02 DIAGNOSIS — C9110 Chronic lymphocytic leukemia of B-cell type not having achieved remission: Secondary | ICD-10-CM | POA: Diagnosis not present

## 2015-06-02 DIAGNOSIS — I2510 Atherosclerotic heart disease of native coronary artery without angina pectoris: Secondary | ICD-10-CM

## 2015-06-02 NOTE — Progress Notes (Signed)
[facility] [address], [address] [place], [place]  [address] Phone: [phone] Fax:  [phone]  Date:  06/02/2015   ID:  Derek [name], Derek [name] [birth year], MRN [id number]  PCP:  Wenda Low, MD   History of Present Illness: Derek [name] is a 74 y.o. male with CAD. He had been complaining of right shoulder and right upper chest discomfort with walking and occasional shortness of breath with no associated diaphoresis or nausea. He has a history of diabetes, hypertension and obesity. Dr. Lysle Rubens ordered a nuclear stress test. Stress test was performed and demonstrated evidence of mild ischemia in the basal inferolateral to mid inferolateral region. Felt to be a moderate risk myocardial perfusion study. He also had during exercise a small/brief run of possible ventricular tachycardia that was slow and was noted to have significant dyspnea on exertion.  Catheterization demonstrated mid LAD 85% stenosis immediately after bifurcation of a large diagonal branch. Distal to this lesion approximately 15 mm was another 95% stenosis, calcified. In the very distal regions of the LAD there was diffuse disease up to 80%. The large diagonal branch mentioned above also demonstrated 2 focal areas of 85% stenosis of the proximal/mid vessel. Other vessels demonstrated minor luminal irregularities. Ejection fraction 55%. Discussed with Dr. Consuello Closs.   After cardiac catheterization I started him on metoprolol as well as isosorbide. He states that with medication, he has not been feeling any further symptoms. No exertional discomfort at this point. He will monitor this closely. We did discuss the risks of taking Viagra with isosorbide. For the first 3 days he had a headache with isosorbide, this is improved.  On 03/15/13-he is interested in perhaps coming off of the isosorbide because of the limitations with Viagra. I'm willing to allow him to trial being off of this medication. Of course if he develops  anginal-like symptoms, I would like for him to be back on isosorbide. We have discussed at length. He is going to [facility]  08/28/13 - Left ankle swelling after driving from [place]. Stopped and walked around. Some pain in right arm when walking up stairs. His isosorbide remains on his drug list. He believes he is still taking this medication.  05/16/14-he is now once again complaining of symptoms of right-sided arm/shoulder discomfort, achiness when exerting himself,  walking up stairs, any other vigorous activity. It is relieved with rest. When walking, he tries to raise his arm above his head to change or less and this symptom but usually it is rest that seems to alleviate the discomfort.  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 He does have associated anemia with this and likely this is contributing to his symptoms. He still will feel right shoulder/arm pain with some physical activity however. He is taking his medicine. The increase in the Toprol seems to have helped.  11/12/14 - PNA in ER in [place]. Last round of chemo. Taking abx. Saw Dr. Laurann Montana. Inh as well.   06/02/15 - Weight loss discussed at length. He is in remission in regards to his CLL. He is not having any anginal symptoms with his current medications.   Wt Readings from Last 3 Encounters:  06/02/15 302 lb (136.986 kg)  12/29/14 295 lb (133.811 kg)  12/23/14 295 lb 11.2 oz (134.129 kg)     Past Medical History  Diagnosis Date  . Lymphocytosis   . Hypertension   .  Obesity   . Hypertriglyceridemia   . 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 Diabetes mellitus, type II   . H/O exercise stress test 1999, 2002  . H/O cardiac catheterization     Past Surgical History  Procedure Laterality Date  . Foot surgery Right   . Vericose vein stripping    . Nasal sinus surgery      Current Outpatient Prescriptions  Medication  Sig Dispense Refill  . aspirin 81 MG tablet Take 81 mg by mouth every other day.     Marland Kitchen atorvastatin (LIPITOR) 40 MG tablet Take 40 mg by mouth daily.    . fish oil-omega-3 fatty acids 1000 MG capsule Take 2 g by mouth daily.     . isosorbide mononitrate (IMDUR) 30 MG 24 hr tablet Take 30 mg by mouth every morning.     Marland Kitchen lisinopril-hydrochlorothiazide (PRINZIDE,ZESTORETIC) 10-12.5 MG per tablet Take 1 tablet by mouth every morning.     . metFORMIN (GLUCOPHAGE) 1000 MG tablet Take 1,000 mg by mouth 2 (two) times daily with a meal.    . metoprolol succinate (TOPROL-XL) 25 MG 24 hr tablet      No current facility-administered medications for this visit.    Allergies:   No Known Allergies  Social History:  The patient  reports that he quit smoking about 26 years ago. He has never used smokeless tobacco. He reports that he drinks about 8.4 oz of alcohol per week.   ROS:  Please see the history of present illness.   No syncope, no bleeding, no orthopnea, no PND    PHYSICAL EXAM: VS:  BP 130/59 mmHg  Pulse 62  Ht 6\' 4"  (1.93 m)  Wt 302 lb (136.986 kg)  BMI 36.78 kg/m2 Well nourished, well developed, in no acute distress HEENT: normal Neck: no JVD Cardiac:  normal S1, S2; RRR; no murmur Lungs:  clear to auscultation bilaterally, no wheezing, rhonchi or rales Abd: soft, nontender, no hepatomegalyObese Ext: no edema Skin: warm and dry Neuro: no focal abnormalities noted  EKG:  Today 06/02/15-sinus rhythm, LAFB, 62 bpm personally reviewed-prior 05/16/14-Normal rhythm, rate 62 poor R wave progression, left axis deviation, no other changes   Cardiac catheterization 01/17/13: Stress test moderate risk with inferolateral mid to apex ischemia. 1. Mid LAD disease immediately after the bifurcation of a large diagonal branch of 85% stenosis, then 15 mm distal to that, 95% calcified stenosis, then distal LAD diffuse disease with lesions of up to 80-90%. Nitroglycerin administered with mild increase in  caliber of the distal vessel. There is also small caliber posterior lateral branch 90% proximal stenosis, collateral blood flow. There is small caliber obtuse marginal branch stenosis/occlusion as well with left to left collaterals filling retrograde.  2. Normal left ventricular systolic function. LVEDP 6 mmHg. Ejection fraction 55%. No significant wall motion abnormalities. Have discussed this with Dr. Irish Lack.   ASSESSMENT AND PLAN:  1. Coronary artery disease- angina is currently well controlled with both beta blocker as well as long-acting nitrate. Challenging to discern whether or not his prior right-sided arm symptoms are musculoskeletal versus anginal equivalent. I'm unable to reduplicate the discomfort with arm motion or palpation. He states clearly previously that he obtains this discomfort when walking up a hill, stairs in his house, any other exertional activity. At first, when starting metoprolol this seemed to help. I increased his metoprolol to 50 mg and this helped a little bit.. Isosorbide 30. Aggressive secondary prevention. Disease as described above. Reviewed. I discussed the  case previously with Dr. Irish Lack.  I think the main thing is to continue with secondary prevention efforts such as weight loss, conditioning.  2. CLL -  Dr. Alvy Bimler. Port now out. Remission.  3. Angina-atypical right arm pain with motion. Could be anginal symptoms or perhaps musculoskeletal (not as convinced). 4. Obesity-continue with aggressive diet, exercise.   5. Hyperlipidemia-continue with atorvastatin. LFTs normal in June.  Prior LDL 52. 6. Diabetes-Dr. Deforest Hoyles working on this. Medications reviewed. ACE inhibitor for renal protection. Creatinine 1.1  7. 12 month f/u.  Signed, [name], MD [facility]  06/02/2015 2:00 PM

## 2015-06-02 NOTE — Patient Instructions (Signed)
Medication Instructions:  The current medical regimen is effective;  continue present plan and medications.  Follow-Up: Follow up in 1 year with Dr. Skains.  You will receive a letter in the mail 2 months before you are due.  Please call us when you receive this letter to schedule your follow up appointment.  Thank you for choosing [facility]!!     

## 2015-06-17 ENCOUNTER — Encounter: Payer: Self-pay | Admitting: Cardiology

## 2015-06-25 ENCOUNTER — Telehealth: Payer: Self-pay | Admitting: Hematology and Oncology

## 2015-06-25 ENCOUNTER — Other Ambulatory Visit (HOSPITAL_BASED_OUTPATIENT_CLINIC_OR_DEPARTMENT_OTHER): Payer: Medicare Other

## 2015-06-25 ENCOUNTER — Encounter: Payer: Self-pay | Admitting: Hematology and Oncology

## 2015-06-25 ENCOUNTER — Ambulatory Visit (HOSPITAL_BASED_OUTPATIENT_CLINIC_OR_DEPARTMENT_OTHER): Payer: Medicare Other | Admitting: Hematology and Oncology

## 2015-06-25 VITALS — BP 142/60 | HR 63 | Temp 98.4°F | Resp 20 | Ht 76.0 in | Wt 304.3 lb

## 2015-06-25 DIAGNOSIS — D638 Anemia in other chronic diseases classified elsewhere: Secondary | ICD-10-CM

## 2015-06-25 DIAGNOSIS — C9110 Chronic lymphocytic leukemia of B-cell type not having achieved remission: Secondary | ICD-10-CM

## 2015-06-25 DIAGNOSIS — Z23 Encounter for immunization: Secondary | ICD-10-CM | POA: Diagnosis present

## 2015-06-25 DIAGNOSIS — Z299 Encounter for prophylactic measures, unspecified: Secondary | ICD-10-CM | POA: Insufficient documentation

## 2015-06-25 LAB — CBC WITH DIFFERENTIAL/PLATELET
BASO%: 0.7 % (ref 0.0–2.0)
Basophils Absolute: 0.1 10*3/uL (ref 0.0–0.1)
EOS%: 1.2 % (ref 0.0–7.0)
Eosinophils Absolute: 0.2 10*3/uL (ref 0.0–0.5)
HCT: 36.5 % — ABNORMAL LOW (ref 38.4–49.9)
HGB: 12.4 g/dL — ABNORMAL LOW (ref 13.0–17.1)
LYMPH%: 9.4 % — ABNORMAL LOW (ref 14.0–49.0)
MCH: 30.1 pg (ref 27.2–33.4)
MCHC: 34.1 g/dL (ref 32.0–36.0)
MCV: 88.2 fL (ref 79.3–98.0)
MONO#: 1 10*3/uL — ABNORMAL HIGH (ref 0.1–0.9)
MONO%: 7.6 % (ref 0.0–14.0)
NEUT#: 10.4 10*3/uL — ABNORMAL HIGH (ref 1.5–6.5)
NEUT%: 81.1 % — ABNORMAL HIGH (ref 39.0–75.0)
Platelets: 173 10*3/uL (ref 140–400)
RBC: 4.14 10*6/uL — ABNORMAL LOW (ref 4.20–5.82)
RDW: 15.4 % — ABNORMAL HIGH (ref 11.0–14.6)
WBC: 12.8 10*3/uL — ABNORMAL HIGH (ref 4.0–10.3)
lymph#: 1.2 10*3/uL (ref 0.9–3.3)

## 2015-06-25 LAB — COMPREHENSIVE METABOLIC PANEL (CC13)
ALT: 14 U/L (ref 0–55)
AST: 15 U/L (ref 5–34)
Albumin: 3.8 g/dL (ref 3.5–5.0)
Alkaline Phosphatase: 61 U/L (ref 40–150)
Anion Gap: 9 mEq/L (ref 3–11)
BUN: 15.9 mg/dL (ref 7.0–26.0)
CO2: 25 mEq/L (ref 22–29)
Calcium: 9.3 mg/dL (ref 8.4–10.4)
Chloride: 106 mEq/L (ref 98–109)
Creatinine: 1 mg/dL (ref 0.7–1.3)
EGFR: 72 mL/min/{1.73_m2} — ABNORMAL LOW (ref >90)
Glucose: 140 mg/dl (ref 70–140)
Potassium: 3.9 mEq/L (ref 3.5–5.1)
Sodium: 140 mEq/L (ref 136–145)
Total Bilirubin: 0.95 mg/dL (ref 0.20–1.20)
Total Protein: 6.7 g/dL (ref 6.4–8.3)

## 2015-06-25 LAB — LACTATE DEHYDROGENASE (CC13): LDH: 131 U/L (ref 125–245)

## 2015-06-25 MED ORDER — INFLUENZA VAC SPLIT QUAD 0.5 ML IM SUSY
0.5000 mL | PREFILLED_SYRINGE | Freq: Once | INTRAMUSCULAR | Status: AC
  Administered 2015-06-25: 0.5 mL via INTRAMUSCULAR
  Filled 2015-06-25: qty 0.5

## 2015-06-25 NOTE — Assessment & Plan Note (Signed)
This is likely anemia of chronic disease. The patient denies recent history of bleeding such as epistaxis, hematuria or hematochezia. He is asymptomatic from the anemia. We will observe for now.  

## 2015-06-25 NOTE — Assessment & Plan Note (Signed)
He had completed all treatment and have complete response. I plan to see him back in 9 months with repeat history, physical examination and blood work.

## 2015-06-25 NOTE — Telephone Encounter (Signed)
Gave adn printed appt sched and avs for pt for June 2017 °

## 2015-06-25 NOTE — Assessment & Plan Note (Signed)
We discussed the importance of preventive care and reviewed the vaccination programs. He does not have any prior allergic reactions to influenza vaccination. He agrees to proceed with influenza vaccination today and we will administer it today at the clinic.  

## 2015-06-25 NOTE — Progress Notes (Signed)
[place] OFFICE PROGRESS NOTE  Patient Care Team: Wenda Low, MD as PCP - General (Internal Medicine) Jerline Pain, MD as Attending Physician (Cardiology)  SUMMARY OF ONCOLOGIC HISTORY:   CLL (chronic lymphocytic leukemia) ([place])   09/10/2013 Initial Diagnosis CLL (chronic lymphocytic leukemia)   12/26/2013 Pathology Results FISH analysis was normal.   06/17/2014 Procedure The patient has placement of Infuse-a-Port.   06/18/2014 Imaging CT scan of the chest, abdomen and pelvis showed diffuse lymphadenopathy and splenomegaly.   06/19/2014 Bone Marrow Biopsy Bone marrow aspirate and biopsy show CLL.   06/24/2014 - 11/18/2014 Chemotherapy He received 6 cycles of Obinutuzumab and chlorambucil   09/15/2014 Imaging Repeat CT scan of the chest, abdomen and pelvis show greater than 50% reduction in lymphadenopathy and splenomegaly   12/19/2014 Imaging Repeat CT scan showed complete resolution of lymphadenopathy and splenomegaly.    INTERVAL HISTORY: Please see below for problem oriented charting. He feels well. Denies new lymphadenopathy. Denies recent infection.  REVIEW OF SYSTEMS:   Constitutional: Denies fevers, chills or abnormal weight loss Eyes: Denies blurriness of vision Ears, nose, mouth, throat, and face: Denies mucositis or sore throat Respiratory: Denies cough, dyspnea or wheezes Cardiovascular: Denies palpitation, chest discomfort or lower extremity swelling Gastrointestinal:  Denies nausea, heartburn or change in bowel habits Skin: Denies abnormal skin rashes Lymphatics: Denies new lymphadenopathy or easy bruising Neurological:Denies numbness, tingling or new weaknesses Behavioral/Psych: Mood is stable, no new changes  All other systems were reviewed with the patient and are negative.  I have reviewed the past medical history, past surgical history, social history and family history with the patient and they are unchanged from previous note.  ALLERGIES:  has  No Known Allergies.  MEDICATIONS:  Current Outpatient Prescriptions  Medication Sig Dispense Refill  . aspirin 81 MG tablet Take 81 mg by mouth every other day.     Marland Kitchen atorvastatin (LIPITOR) 40 MG tablet Take 40 mg by mouth daily.    . fish oil-omega-3 fatty acids 1000 MG capsule Take 2 g by mouth daily.     . isosorbide mononitrate (IMDUR) 30 MG 24 hr tablet Take 30 mg by mouth every morning.     Marland Kitchen lisinopril-hydrochlorothiazide (PRINZIDE,ZESTORETIC) 10-12.5 MG per tablet Take 1 tablet by mouth every morning.     . metFORMIN (GLUCOPHAGE) 1000 MG tablet Take 1,000 mg by mouth 2 (two) times daily with a meal.    . metoprolol succinate (TOPROL-XL) 25 MG 24 hr tablet      No current facility-administered medications for this visit.    PHYSICAL EXAMINATION: ECOG PERFORMANCE STATUS: 0 - Asymptomatic  Filed Vitals:   06/25/15 0811  BP: 142/60  Pulse: 63  Temp: 98.4 F (36.9 C)  Resp: 20   Filed Weights   06/25/15 0811  Weight: 304 lb 4.8 oz (138.03 kg)    GENERAL:alert, no distress and comfortable. He is morbidly obese SKIN: skin color, texture, turgor are normal, no rashes or significant lesions EYES: normal, Conjunctiva are pink and non-injected, sclera clear OROPHARYNX:no exudate, no erythema and lips, buccal mucosa, and tongue normal  NECK: supple, thyroid normal size, non-tender, without nodularity LYMPH:  no palpable lymphadenopathy in the cervical, axillary or inguinal LUNGS: clear to auscultation and percussion with normal breathing effort HEART: regular rate & rhythm and no murmurs and no lower extremity edema ABDOMEN:abdomen soft, non-tender and normal bowel sounds Musculoskeletal:no cyanosis of digits and no clubbing  NEURO: alert & oriented x 3 with fluent speech, no focal motor/sensory  deficits  LABORATORY DATA:  I have reviewed the data as listed    Component Value Date/Time   NA 140 06/25/2015 0758   K 3.9 06/25/2015 0758   CL 101 03/11/2013 1031   CO2 25  06/25/2015 0758   GLUCOSE 140 06/25/2015 0758   GLUCOSE 173* 03/11/2013 1031   GLUCOSE 128* 01/17/2013 1040   BUN 15.9 06/25/2015 0758   CREATININE 1.0 06/25/2015 0758   CALCIUM 9.3 06/25/2015 0758   PROT 6.7 06/25/2015 0758   ALBUMIN 3.8 06/25/2015 0758   AST 15 06/25/2015 0758   ALT 14 06/25/2015 0758   ALKPHOS 61 06/25/2015 0758   BILITOT 0.95 06/25/2015 0758    No results found for: SPEP, UPEP  Lab Results  Component Value Date   WBC 12.8* 06/25/2015   NEUTROABS 10.4* 06/25/2015   HGB 12.4* 06/25/2015   HCT 36.5* 06/25/2015   MCV 88.2 06/25/2015   PLT 173 06/25/2015      Chemistry      Component Value Date/Time   NA 140 06/25/2015 0758   K 3.9 06/25/2015 0758   CL 101 03/11/2013 1031   CO2 25 06/25/2015 0758   BUN 15.9 06/25/2015 0758   CREATININE 1.0 06/25/2015 0758      Component Value Date/Time   CALCIUM 9.3 06/25/2015 0758   ALKPHOS 61 06/25/2015 0758   AST 15 06/25/2015 0758   ALT 14 06/25/2015 0758   BILITOT 0.95 06/25/2015 0758     ASSESSMENT & PLAN:  CLL (chronic lymphocytic leukemia) He had completed all treatment and have complete response. I plan to see him back in 9 months with repeat history, physical examination and blood work.    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 The patient denies recent history of bleeding such as epistaxis, hematuria or hematochezia. He is asymptomatic from the anemia. We will observe for now.   Preventive measure We discussed the importance of preventive care and reviewed the vaccination programs. He does not have any prior allergic reactions to influenza vaccination. He agrees to proceed with influenza vaccination today and we will administer it today at the clinic.    Orders Placed This Encounter  Procedures  . CBC with Differential/Platelet    Standing Status: Future     Number of Occurrences:      Standing Expiration Date: 07/29/2016   All questions were answered. The  patient knows to call the clinic with any problems, questions or concerns. No barriers to learning was detected. I spent 15 minutes counseling the patient face to face. The total time spent in the appointment was 20 minutes and more than 50% was on counseling and review of test results     [facility], [place], MD 06/25/2015 1:26 PM

## 2015-07-27 DIAGNOSIS — E7800 Pure hypercholesterolemia, unspecified: Secondary | ICD-10-CM | POA: Diagnosis not present

## 2015-07-27 DIAGNOSIS — E08621 Diabetes mellitus due to underlying condition with foot ulcer: Secondary | ICD-10-CM | POA: Diagnosis not present

## 2015-07-27 DIAGNOSIS — I10 Essential (primary) hypertension: Secondary | ICD-10-CM | POA: Diagnosis not present

## 2015-07-27 DIAGNOSIS — E1122 Type 2 diabetes mellitus with diabetic chronic kidney disease: Secondary | ICD-10-CM | POA: Diagnosis not present

## 2015-07-27 DIAGNOSIS — N182 Chronic kidney disease, stage 2 (mild): Secondary | ICD-10-CM | POA: Diagnosis not present

## 2015-07-27 DIAGNOSIS — I2510 Atherosclerotic heart disease of native coronary artery without angina pectoris: Secondary | ICD-10-CM | POA: Diagnosis not present

## 2015-07-27 DIAGNOSIS — C9110 Chronic lymphocytic leukemia of B-cell type not having achieved remission: Secondary | ICD-10-CM | POA: Diagnosis not present

## 2015-08-27 DIAGNOSIS — E119 Type 2 diabetes mellitus without complications: Secondary | ICD-10-CM | POA: Diagnosis not present

## 2015-08-27 DIAGNOSIS — H2513 Age-related nuclear cataract, bilateral: Secondary | ICD-10-CM | POA: Diagnosis not present

## 2015-09-22 DIAGNOSIS — L308 Other specified dermatitis: Secondary | ICD-10-CM | POA: Diagnosis not present

## 2015-11-09 DIAGNOSIS — M2022 Hallux rigidus, left foot: Secondary | ICD-10-CM | POA: Diagnosis not present

## 2015-11-09 DIAGNOSIS — M79672 Pain in left foot: Secondary | ICD-10-CM | POA: Diagnosis not present

## 2015-11-09 DIAGNOSIS — M7752 Other enthesopathy of left foot: Secondary | ICD-10-CM | POA: Diagnosis not present

## 2015-11-16 DIAGNOSIS — M25572 Pain in left ankle and joints of left foot: Secondary | ICD-10-CM | POA: Diagnosis not present

## 2015-11-16 DIAGNOSIS — M2022 Hallux rigidus, left foot: Secondary | ICD-10-CM | POA: Diagnosis not present

## 2016-01-27 DIAGNOSIS — Z1389 Encounter for screening for other disorder: Secondary | ICD-10-CM | POA: Diagnosis not present

## 2016-01-27 DIAGNOSIS — R0609 Other forms of dyspnea: Secondary | ICD-10-CM | POA: Diagnosis not present

## 2016-01-27 DIAGNOSIS — I872 Venous insufficiency (chronic) (peripheral): Secondary | ICD-10-CM | POA: Diagnosis not present

## 2016-01-27 DIAGNOSIS — C9110 Chronic lymphocytic leukemia of B-cell type not having achieved remission: Secondary | ICD-10-CM | POA: Diagnosis not present

## 2016-01-27 DIAGNOSIS — I10 Essential (primary) hypertension: Secondary | ICD-10-CM | POA: Diagnosis not present

## 2016-01-27 DIAGNOSIS — N182 Chronic kidney disease, stage 2 (mild): Secondary | ICD-10-CM | POA: Diagnosis not present

## 2016-01-27 DIAGNOSIS — Z6839 Body mass index (BMI) 39.0-39.9, adult: Secondary | ICD-10-CM | POA: Diagnosis not present

## 2016-01-27 DIAGNOSIS — Z125 Encounter for screening for malignant neoplasm of prostate: Secondary | ICD-10-CM | POA: Diagnosis not present

## 2016-01-27 DIAGNOSIS — Z0000 Encounter for general adult medical examination without abnormal findings: Secondary | ICD-10-CM | POA: Diagnosis not present

## 2016-01-27 DIAGNOSIS — E1122 Type 2 diabetes mellitus with diabetic chronic kidney disease: Secondary | ICD-10-CM | POA: Diagnosis not present

## 2016-01-27 DIAGNOSIS — E669 Obesity, unspecified: Secondary | ICD-10-CM | POA: Diagnosis not present

## 2016-01-27 DIAGNOSIS — E785 Hyperlipidemia, unspecified: Secondary | ICD-10-CM | POA: Diagnosis not present

## 2016-02-25 ENCOUNTER — Ambulatory Visit: Payer: Medicare Other | Admitting: Hematology and Oncology

## 2016-02-25 ENCOUNTER — Telehealth: Payer: Self-pay | Admitting: Hematology and Oncology

## 2016-02-25 ENCOUNTER — Other Ambulatory Visit: Payer: Medicare Other

## 2016-02-25 NOTE — Telephone Encounter (Signed)
s.w. pt and r/s missed appt....pt ok and aware of new d.t °

## 2016-03-10 ENCOUNTER — Telehealth: Payer: Self-pay | Admitting: Hematology and Oncology

## 2016-03-10 ENCOUNTER — Other Ambulatory Visit (HOSPITAL_BASED_OUTPATIENT_CLINIC_OR_DEPARTMENT_OTHER): Payer: Medicare Other

## 2016-03-10 ENCOUNTER — Encounter: Payer: Self-pay | Admitting: Hematology and Oncology

## 2016-03-10 ENCOUNTER — Ambulatory Visit (HOSPITAL_BASED_OUTPATIENT_CLINIC_OR_DEPARTMENT_OTHER): Payer: Medicare Other | Admitting: Hematology and Oncology

## 2016-03-10 VITALS — BP 132/54 | HR 52 | Temp 97.7°F | Resp 18 | Ht 76.0 in | Wt 312.4 lb

## 2016-03-10 DIAGNOSIS — C9110 Chronic lymphocytic leukemia of B-cell type not having achieved remission: Secondary | ICD-10-CM

## 2016-03-10 DIAGNOSIS — F1010 Alcohol abuse, uncomplicated: Secondary | ICD-10-CM

## 2016-03-10 DIAGNOSIS — Z299 Encounter for prophylactic measures, unspecified: Secondary | ICD-10-CM

## 2016-03-10 LAB — CBC WITH DIFFERENTIAL/PLATELET
BASO%: 0.7 % (ref 0.0–2.0)
Basophils Absolute: 0.1 10*3/uL (ref 0.0–0.1)
EOS%: 2.1 % (ref 0.0–7.0)
Eosinophils Absolute: 0.2 10*3/uL (ref 0.0–0.5)
HCT: 38.8 % (ref 38.4–49.9)
HGB: 13.3 g/dL (ref 13.0–17.1)
LYMPH%: 16.9 % (ref 14.0–49.0)
MCH: 30.8 pg (ref 27.2–33.4)
MCHC: 34.2 g/dL (ref 32.0–36.0)
MCV: 90.1 fL (ref 79.3–98.0)
MONO#: 0.6 10*3/uL (ref 0.1–0.9)
MONO%: 7.5 % (ref 0.0–14.0)
NEUT#: 5.8 10*3/uL (ref 1.5–6.5)
NEUT%: 72.8 % (ref 39.0–75.0)
Platelets: 170 10*3/uL (ref 140–400)
RBC: 4.31 10*6/uL (ref 4.20–5.82)
RDW: 14.4 % (ref 11.0–14.6)
WBC: 8 10*3/uL (ref 4.0–10.3)
lymph#: 1.4 10*3/uL (ref 0.9–3.3)

## 2016-03-10 NOTE — Assessment & Plan Note (Addendum)
He had completed all treatment and have complete response. Examination today showed no evidence of recurrence. I plan to see him back in 12 months with repeat history, physical examination and blood work. 

## 2016-03-10 NOTE — Telephone Encounter (Signed)
per pof to sch pt appt-gave pt copy of avs °

## 2016-03-10 NOTE — Progress Notes (Signed)
[place] OFFICE PROGRESS NOTE  Patient Care Team: Wenda Low, MD as PCP - General (Internal Medicine) Jerline Pain, MD as Attending Physician (Cardiology)  SUMMARY OF ONCOLOGIC HISTORY:   CLL (chronic lymphocytic leukemia) ([place])   09/10/2013 Initial Diagnosis CLL (chronic lymphocytic leukemia)   12/26/2013 Pathology Results FISH analysis was normal.   06/17/2014 Procedure The patient has placement of Infuse-a-Port.   06/18/2014 Imaging CT scan of the chest, abdomen and pelvis showed diffuse lymphadenopathy and splenomegaly.   06/19/2014 Bone Marrow Biopsy Bone marrow aspirate and biopsy show CLL.   06/24/2014 - 11/18/2014 Chemotherapy He received 6 cycles of Obinutuzumab and chlorambucil   09/15/2014 Imaging Repeat CT scan of the chest, abdomen and pelvis show greater than 50% reduction in lymphadenopathy and splenomegaly   12/19/2014 Imaging Repeat CT scan showed complete resolution of lymphadenopathy and splenomegaly.    INTERVAL HISTORY: Please see below for problem oriented charting. He returns for further follow-up. He feels well. Denies recent infection. He is gaining weight. The patient denies any recent signs or symptoms of bleeding such as spontaneous epistaxis, hematuria or hematochezia. No new lymphadenopathy.  REVIEW OF SYSTEMS:   Constitutional: Denies fevers, chills or abnormal weight loss Eyes: Denies blurriness of vision Ears, nose, mouth, throat, and face: Denies mucositis or sore throat Respiratory: Denies cough, dyspnea or wheezes Cardiovascular: Denies palpitation, chest discomfort or lower extremity swelling Gastrointestinal:  Denies nausea, heartburn or change in bowel habits Skin: Denies abnormal skin rashes Neurological:Denies numbness, tingling or new weaknesses Behavioral/Psych: Mood is stable, no new changes  All other systems were reviewed with the patient and are negative.  I have reviewed the past medical history, past surgical  history, social history and family history with the patient and they are unchanged from previous note.  ALLERGIES:  has No Known Allergies.  MEDICATIONS:  Current Outpatient Prescriptions  Medication Sig Dispense Refill  . aspirin 81 MG tablet Take 81 mg by mouth every other day.     Marland Kitchen atorvastatin (LIPITOR) 40 MG tablet Take 40 mg by mouth daily.    . fish oil-omega-3 fatty acids 1000 MG capsule Take 2 g by mouth daily.     . isosorbide mononitrate (IMDUR) 30 MG 24 hr tablet Take 30 mg by mouth every morning.     Marland Kitchen lisinopril-hydrochlorothiazide (PRINZIDE,ZESTORETIC) 10-12.5 MG per tablet Take 1 tablet by mouth every morning.     . metFORMIN (GLUCOPHAGE) 1000 MG tablet Take 1,000 mg by mouth 2 (two) times daily with a meal.    . metoprolol succinate (TOPROL-XL) 25 MG 24 hr tablet      No current facility-administered medications for this visit.    PHYSICAL EXAMINATION: ECOG PERFORMANCE STATUS: 0 - Asymptomatic  Filed Vitals:   03/10/16 1151  BP: 132/54  Pulse: 52  Temp: 97.7 F (36.5 C)  Resp: 18   Filed Weights   03/10/16 1151  Weight: 312 lb 6.4 oz (141.704 kg)    GENERAL:alert, no distress and comfortable. He is morbidly obese SKIN: skin color, texture, turgor are normal, no rashes or significant lesions. Noted some old bruises EYES: normal, Conjunctiva are pink and non-injected, sclera clear OROPHARYNX:no exudate, no erythema and lips, buccal mucosa, and tongue normal  NECK: supple, thyroid normal size, non-tender, without nodularity LYMPH:  no palpable lymphadenopathy in the cervical, axillary or inguinal LUNGS: clear to auscultation and percussion with normal breathing effort HEART: regular rate & rhythm and no murmurs with minor bilateral lower extremity edema ABDOMEN:abdomen soft, non-tender  and normal bowel sounds Musculoskeletal:no cyanosis of digits and no clubbing  NEURO: alert & oriented x 3 with fluent speech, no focal motor/sensory deficits  LABORATORY  DATA:  I have reviewed the data as listed    Component Value Date/Time   NA 140 06/25/2015 0758   K 3.9 06/25/2015 0758   CL 101 03/11/2013 1031   CO2 25 06/25/2015 0758   GLUCOSE 140 06/25/2015 0758   GLUCOSE 173* 03/11/2013 1031   GLUCOSE 128* 01/17/2013 1040   BUN 15.9 06/25/2015 0758   CREATININE 1.0 06/25/2015 0758   CALCIUM 9.3 06/25/2015 0758   PROT 6.7 06/25/2015 0758   ALBUMIN 3.8 06/25/2015 0758   AST 15 06/25/2015 0758   ALT 14 06/25/2015 0758   ALKPHOS 61 06/25/2015 0758   BILITOT 0.95 06/25/2015 0758    No results found for: SPEP, UPEP  Lab Results  Component Value Date   WBC 8.0 03/10/2016   NEUTROABS 5.8 03/10/2016   HGB 13.3 03/10/2016   HCT 38.8 03/10/2016   MCV 90.1 03/10/2016   PLT 170 03/10/2016      Chemistry      Component Value Date/Time   NA 140 06/25/2015 0758   K 3.9 06/25/2015 0758   CL 101 03/11/2013 1031   CO2 25 06/25/2015 0758   BUN 15.9 06/25/2015 0758   CREATININE 1.0 06/25/2015 0758      Component Value Date/Time   CALCIUM 9.3 06/25/2015 0758   ALKPHOS 61 06/25/2015 0758   AST 15 06/25/2015 0758   ALT 14 06/25/2015 0758   BILITOT 0.95 06/25/2015 0758      ASSESSMENT & PLAN:  CLL (chronic lymphocytic leukemia) He had completed all treatment and have complete response. Examination today showed no evidence of recurrence. I plan to see him back in 12 months with repeat history, physical examination and blood work.    Morbid obesity ([place]) The patient is morbidly obese with cardiovascular risk factors. He drinks alcohol on a regular basis. I reinforced the importance of increasing physical activity and refraining from drinking excessive amount of alcohol  Preventive measure We discussed the importance of preventive care and reviewed the vaccination programs. I recommend annual influenza vaccination and vitamin D supplement    Orders Placed This Encounter  Procedures  . CBC with Differential/Platelet    Standing  Status: Future     Number of Occurrences:      Standing Expiration Date: 04/14/2017   All questions were answered. The patient knows to call the clinic with any problems, questions or concerns. No barriers to learning was detected. I spent 15 minutes counseling the patient face to face. The total time spent in the appointment was 20 minutes and more than 50% was on counseling and review of test results     [facility], Irja Wheless, MD 03/10/2016 12:05 PM

## 2016-03-10 NOTE — Assessment & Plan Note (Signed)
The patient is morbidly obese with cardiovascular risk factors. He drinks alcohol on a regular basis. I reinforced the importance of increasing physical activity and refraining from drinking excessive amount of alcohol 

## 2016-03-10 NOTE — Assessment & Plan Note (Signed)
We discussed the importance of preventive care and reviewed the vaccination programs. I recommend annual influenza vaccination and vitamin D supplement

## 2016-03-17 ENCOUNTER — Encounter: Payer: Self-pay | Admitting: Cardiology

## 2016-03-17 ENCOUNTER — Ambulatory Visit (INDEPENDENT_AMBULATORY_CARE_PROVIDER_SITE_OTHER): Payer: Medicare Other | Admitting: Cardiology

## 2016-03-17 VITALS — BP 152/78 | HR 59 | Ht 76.0 in | Wt 311.4 lb

## 2016-03-17 DIAGNOSIS — I10 Essential (primary) hypertension: Secondary | ICD-10-CM | POA: Diagnosis not present

## 2016-03-17 DIAGNOSIS — I25118 Atherosclerotic heart disease of native coronary artery with other forms of angina pectoris: Secondary | ICD-10-CM | POA: Diagnosis not present

## 2016-03-17 NOTE — Patient Instructions (Signed)

## 2016-03-17 NOTE — Progress Notes (Signed)
[name]. [address] [place], [place]  [address] Phone: [phone] Fax:  [phone]  Date:  03/17/2016   ID:  Derek [name], Derek [name] [birth year], MRN [id number]  PCP:  Wenda Low, MD   History of Present Illness: Derek [name] is a 75 y.o. male with CAD. He is asking about shortness of breath with exertion.   His prior anginal symptoms were right shoulder and right upper chest discomfort with walking and shortness of breath with no associated diaphoresis or nausea. He has a history of diabetes, hypertension and morbid obesity. Stress test was performed and demonstrated evidence of mild ischemia in the basal inferolateral to mid inferolateral region. Felt to be a moderate risk myocardial perfusion study. He also had during exercise a small/brief run of possible ventricular tachycardia that was slow and was noted to have significant dyspnea on exertion.  Catheterization, see below, demonstrated mid LAD 85% stenosis immediately after bifurcation of a large diagonal branch. Distal to this lesion approximately 15 mm was another 95% stenosis, calcified. In the very distal regions of the LAD there was diffuse disease up to 80%. The large diagonal branch mentioned above also demonstrated 2 focal areas of 85% stenosis of the proximal/mid vessel. Other vessels demonstrated minor luminal irregularities. Ejection fraction 55%. Discussed with Dr. Consuello Closs.   After cardiac catheterization I started him on metoprolol as well as isosorbide. He understands not to take Viagra with Imdur.  He comes ex today and states that he has bad some shortness of breath with exertion but he admits that he does not walk, exercise. Weight loss has been an issue for him especially since his cancer remission.  Wt Readings from Last 3 Encounters:  03/17/16 311 lb 6.4 oz (141.25 kg)  03/10/16 312 lb 6.4 oz (141.704 kg)  06/25/15 304 lb 4.8 oz (138.03 kg)     Past Medical History  Diagnosis Date  .  Lymphocytosis   . Hypertension   . Obesity   . Hypertriglyceridemia   . CLL (chronic lymphocytic leukemia) ([place]) 09/10/2013  . Diabetes mellitus, type II ([place])   . H/O exercise stress test 1999, 2002  . H/O cardiac catheterization     Past Surgical History  Procedure Laterality Date  . Foot surgery Right   . Vericose vein stripping    . Nasal sinus surgery      Current Outpatient Prescriptions  Medication Sig Dispense Refill  . aspirin 81 MG tablet Take 81 mg by mouth every other day.     Marland Kitchen atorvastatin (LIPITOR) 40 MG tablet Take 40 mg by mouth daily.    . fish oil-omega-3 fatty acids 1000 MG capsule Take 2 g by mouth daily.     . isosorbide mononitrate (IMDUR) 30 MG 24 hr tablet Take 30 mg by mouth every morning.     Marland Kitchen lisinopril-hydrochlorothiazide (PRINZIDE,ZESTORETIC) 10-12.5 MG per tablet Take 1 tablet by mouth every morning.     . metFORMIN (GLUCOPHAGE) 1000 MG tablet Take 1,000 mg by mouth 2 (two) times daily with a meal.    . metoprolol succinate (TOPROL-XL) 25 MG 24 hr tablet Take 25 mg by mouth daily.      No current facility-administered medications for this visit.    Allergies:   No Known Allergies  Social History:  The patient  reports that he quit smoking about 27 years ago. He has never used smokeless tobacco. He reports that he drinks about 8.4 oz of alcohol per week.  ROS:  Please see the history of present illness.   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 1. Mid LAD disease immediately after the bifurcation of a large diagonal branch of 85% stenosis, then 15 mm distal to that, 95% calcified stenosis, then distal LAD diffuse disease with lesions of up to 80-90%. Nitroglycerin administered with mild increase in caliber of the distal vessel. There is also small caliber posterior lateral branch 90% proximal stenosis, collateral blood flow. There is small caliber obtuse marginal branch stenosis/occlusion as well with left to left collaterals filling retrograde.  2. Normal left ventricular systolic function. LVEDP 6 mmHg. Ejection fraction 55%. No significant wall motion abnormalities. Have discussed this with Dr. Irish Lack.   ASSESSMENT AND PLAN:  1. Coronary artery disease- angina is currently well controlled with both beta blocker as well as long-acting nitrate. Challenging to discern whether or not his prior right-sided arm symptoms are musculoskeletal versus anginal equivalent. I'm unable to reduplicate the discomfort with arm motion or palpation. He states clearly previously that he obtains this discomfort when walking up a hill, stairs in his house, any other exertional activity. At first, when starting metoprolol this seemed to help. I increased his metoprolol to 50 mg and this helped a little bit.. Isosorbide 30. Aggressive secondary prevention. Disease as described above. Reviewed. I discussed the case previously with Dr. Irish Lack.  I think the main thing is to continue with secondary prevention efforts such as weight loss, conditioning.  2. CLL -  Dr. Alvy Bimler. Port now out. Remission.  3. Angina-atypical right arm pain with motion. Could  be anginal symptoms or perhaps musculoskeletal (not as convinced). Shortness of breath multifactorial. No further cardiac testing at this time. 4. Obesity-morbid obesity continue with aggressive diet, exercise.  Expressed the importance of weight loss. Maintaining diet. Weight loss. 5. Hyperlipidemia-continue with atorvastatin. LFTs normal in June.  Prior LDL 52. 6. Diabetes-Dr. Lysle Rubens working on this. Medications reviewed. ACE inhibitor for renal protection. Creatinine 1.1  7. 12 month f/u.  Signed, [name], MD [facility]  03/17/2016 9:13 AM

## 2016-04-06 IMAGING — CT CT ABD-PELV W/ CM
2 of 4 series · 15 of 46 positions shown, 17 images · IV contrast (OMNIPAQUE)
Comparison: 06/18/2014.

CLINICAL DATA: Subsequent encounter for CLL.

EXAM:
CT CHEST, ABDOMEN, AND PELVIS WITH CONTRAST
TECHNIQUE: Multidetector CT imaging of the chest, abdomen and pelvis was
performed following the standard protocol during bolus
administration of intravenous contrast.
CONTRAST:  100mL OMNIPAQUE IOHEXOL 300 MG/ML  SOLN

[Series 2: cap with st · axial · 0.89mm/px · z∈[+665,+1285]mm · 12 of 142 slices shown, 14 images]
[im 12/142  soft-tissue]
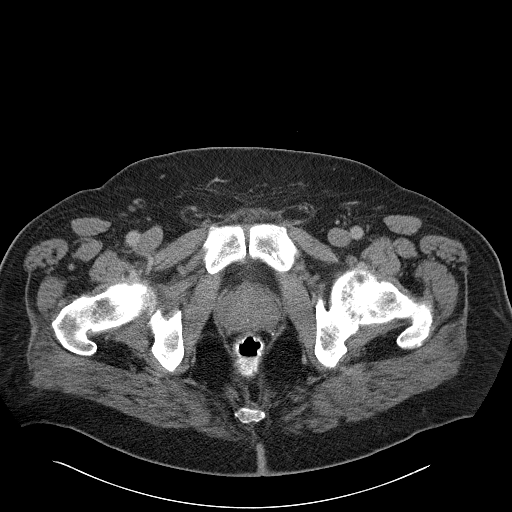
[im 12/142  bone]
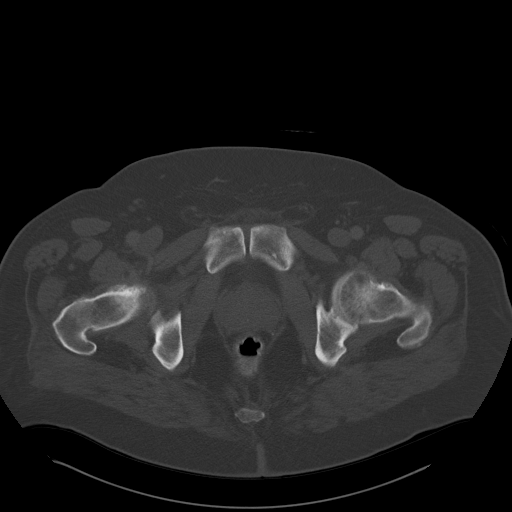
[im 23/142  soft-tissue]
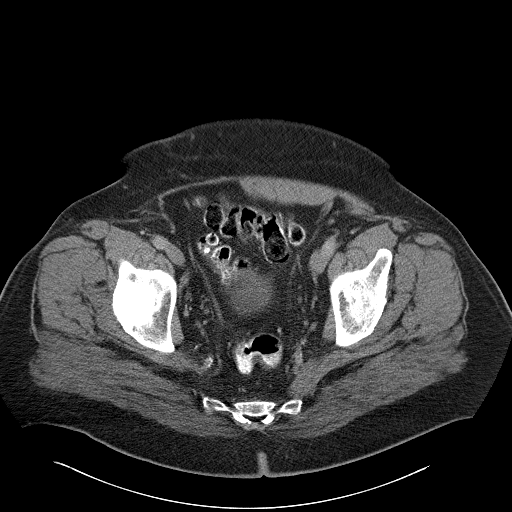
[im 34/142  soft-tissue]
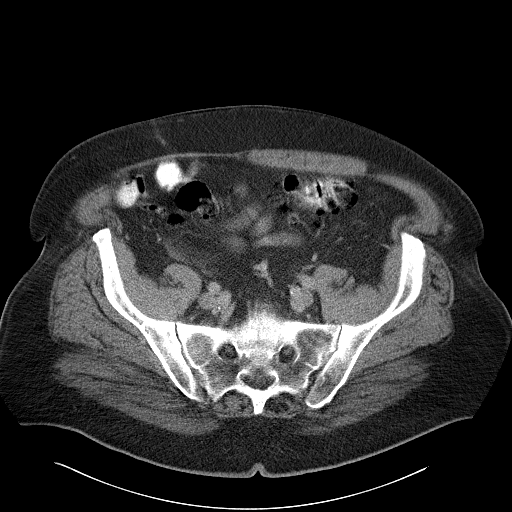
[im 46/142  soft-tissue]
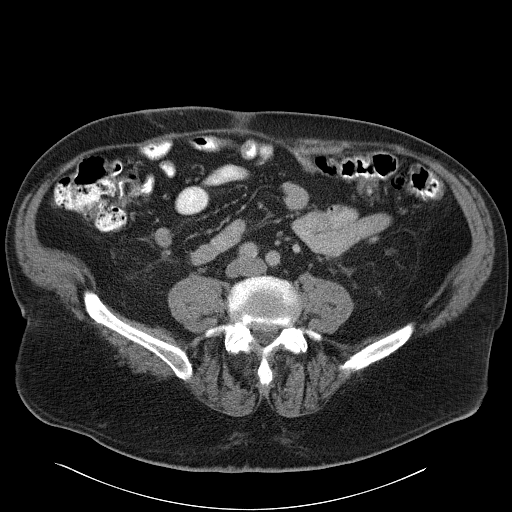
[im 57/142  soft-tissue]
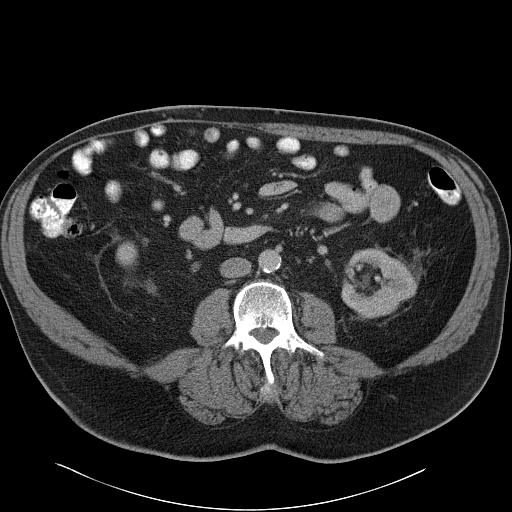
[im 68/142  soft-tissue]
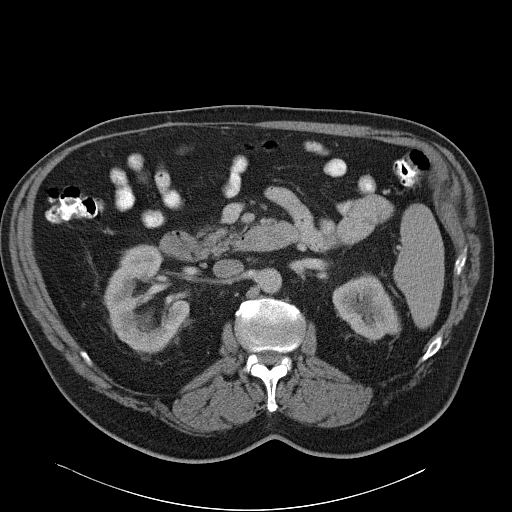
[im 79/142  soft-tissue]
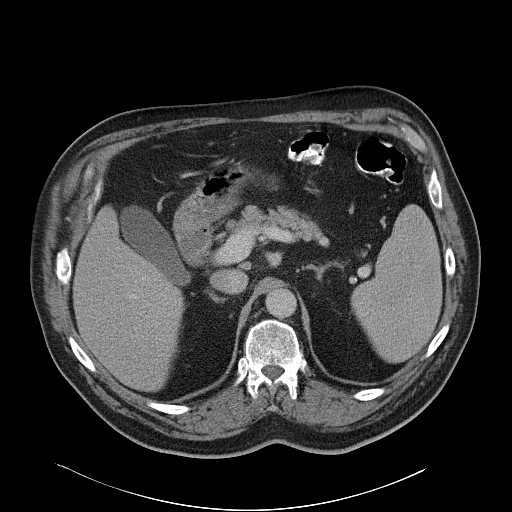
[im 91/142  soft-tissue]
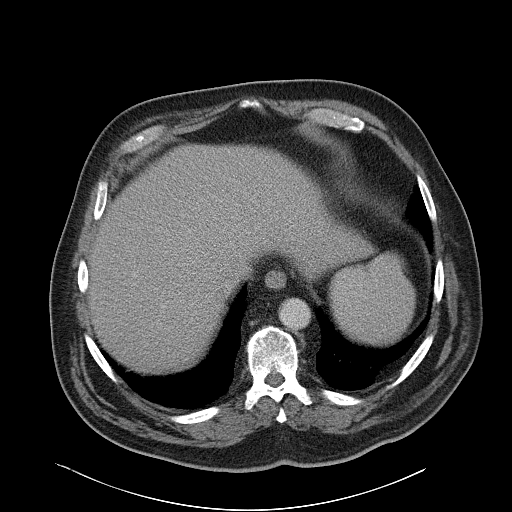
[im 102/142  soft-tissue]
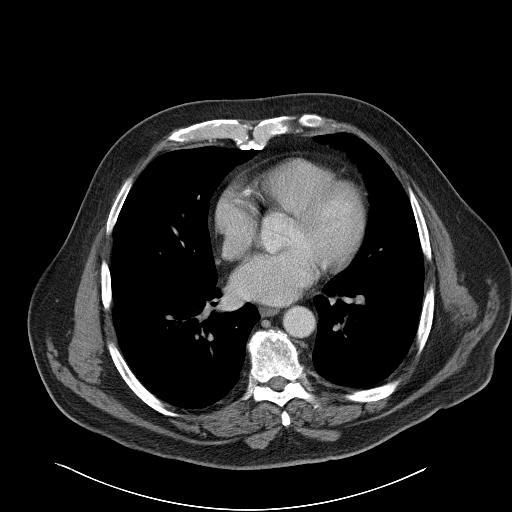
[im 102/142  bone]
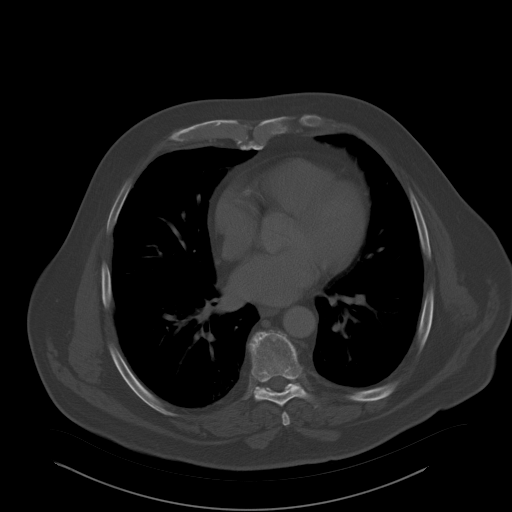
[im 113/142  soft-tissue]
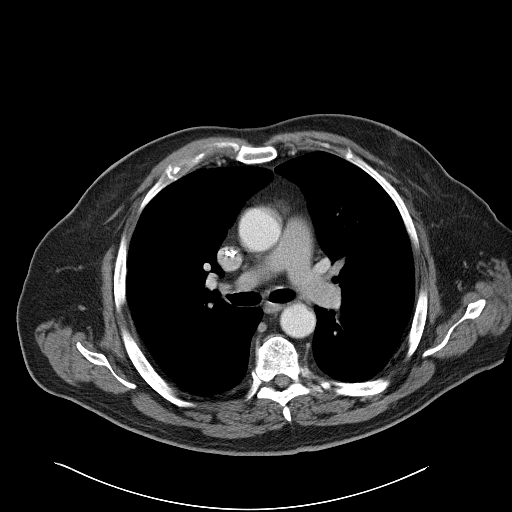
[im 125/142  soft-tissue]
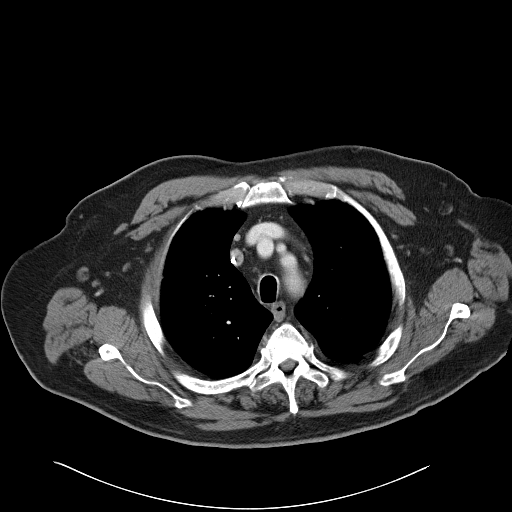
[im 136/142  soft-tissue]
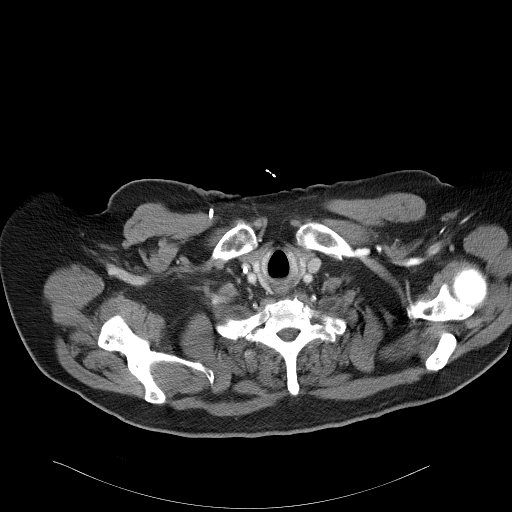

[Series 602: <mpr thick range> · coronal · 1.39mm/px · 3 of 127 slices shown]
[im 43/127  soft-tissue]
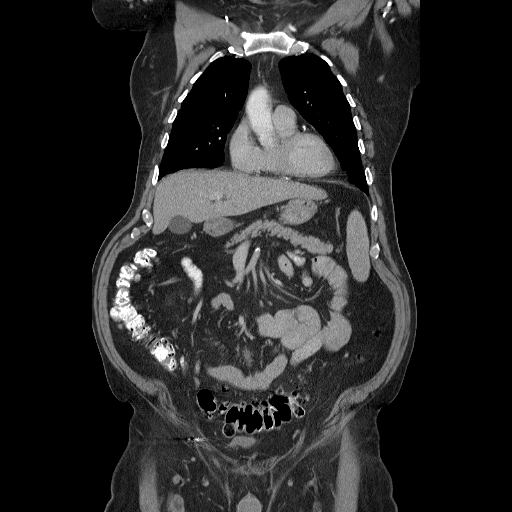
[im 57/127  soft-tissue]
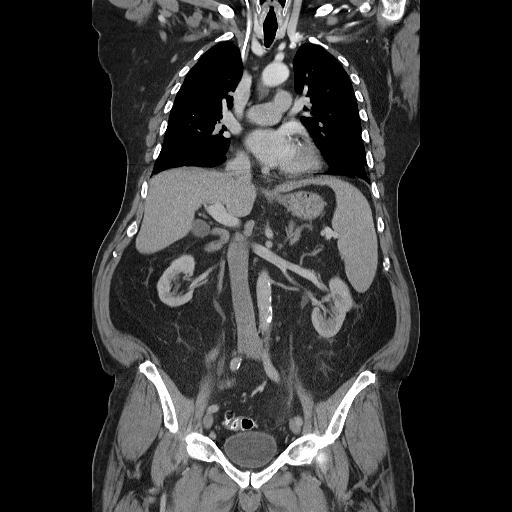
[im 71/127  soft-tissue]
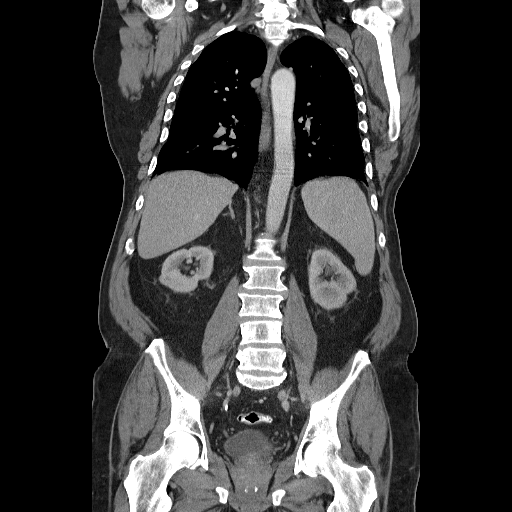

[15 of 46 positions shown; findings below may reference images not displayed]

FINDINGS: CT CHEST FINDINGS

Soft tissue / Mediastinum: Right Port-A-Cath tip is positioned in
the distal SVC, near the junction with the RA.

No axillary lymphadenopathy. 10 mm high right paratracheal lymph
node measured on the previous study has decreased in size, measuring
7 mm in short axis today. Other central mediastinal lymphadenopathy
also appears decreased in the interval. 12 mm index subcarinal lymph
node measured on the previous study has decreased in size to 4 mm
short axis on the current exam. There is no hilar lymphadenopathy.

Heart size is normal. Coronary artery calcification is noted. No
pericardial effusion.

Lungs / Pleura: Lungs are clear without focal airspace consolidation
or pulmonary edema. No pleural effusion. No pulmonary parenchymal
nodule or mass.

Bones: Tiny sclerotic focus in the upper left glenoid is unchanged.
This may well represent a bone island. No worrisome lytic or
sclerotic osseous abnormality.

CT ABDOMEN AND PELVIS FINDINGS

Hepatobiliary: No focal abnormality within the liver parenchyma. A
tiny (4 mm hyper attenuating focus along the nondependent Mid
gallbladder wall could be bouyant stone or tiny polyp. No
gallbladder wall thickening or pericholecystic fluid. No
intrahepatic or extrahepatic biliary dilation.

Pancreas: No focal mass lesion. No dilatation of the main duct. No
intraparenchymal cyst. No peripancreatic edema.

Spleen: Spleen has decreased in size in the interval, measuring
cm in craniocaudal length today compared to 20 cm previously. No
focal abnormality is seen within the splenic parenchyma.

Adrenals/Urinary Tract: No adrenal nodule or mass. 7 x 11 mm stone
is again identified in the right renal pelvis, without evidence for
urinary obstruction. No right ureteral stone.

No stones or hydronephrosis in the left kidney. No left ureteral
stones.

No bladder stones.

Stomach/Bowel: Stomach is nondistended. No gastric wall thickening.
No evidence of outlet obstruction. Duodenum is normally positioned
as is the ligament of Treitz. No small bowel wall thickening. No
small bowel dilatation. Terminal ileum is normal. The appendix is
normal. Diverticular changes are scattered along the entire length
of the colon, but are most pronounced in the sigmoid segment. No
associated pericolonic edema or inflammation to suggest
diverticulitis.

Vascular/Lymphatic: Atherosclerotic calcification is noted in the
wall of the abdominal aorta without aneurysm. No gastrohepatic or
hepatoduodenal ligament lymphadenopathy. No retroperitoneal
lymphadenopathy. No evidence for pelvic sidewall lymphadenopathy.

Reproductive: Prostate gland normal to mildly enlarged. Seminal
vesicles have normal CT imaging features.

Other: No intraperitoneal free fluid.

Musculoskeletal: Bone windows reveal no worrisome lytic or sclerotic
osseous lesions.
IMPRESSION: Interval decrease in mediastinal and hilar lymphadenopathy. No
residual lymphadenopathy in the chest by size criteria.

7 x 11 mm stone again noted in the right renal pelvis without
evidence for urinary obstruction.

Diffuse colonic diverticulosis without diverticulitis.

## 2016-06-07 ENCOUNTER — Other Ambulatory Visit: Payer: Self-pay | Admitting: Internal Medicine

## 2016-06-07 ENCOUNTER — Ambulatory Visit
Admission: RE | Admit: 2016-06-07 | Discharge: 2016-06-07 | Disposition: A | Discharge status: Home / Self Care | Payer: Medicare Other | Source: Ambulatory Visit | Attending: Internal Medicine | Admitting: Internal Medicine

## 2016-06-07 DIAGNOSIS — R079 Chest pain, unspecified: Secondary | ICD-10-CM | POA: Diagnosis not present

## 2016-06-07 DIAGNOSIS — R109 Unspecified abdominal pain: Secondary | ICD-10-CM | POA: Diagnosis not present

## 2016-06-07 DIAGNOSIS — R059 Cough, unspecified: Secondary | ICD-10-CM

## 2016-06-07 DIAGNOSIS — R05 Cough: Secondary | ICD-10-CM | POA: Diagnosis not present

## 2016-06-07 DIAGNOSIS — R1011 Right upper quadrant pain: Secondary | ICD-10-CM | POA: Diagnosis not present

## 2016-06-07 DIAGNOSIS — Z23 Encounter for immunization: Secondary | ICD-10-CM | POA: Diagnosis not present

## 2016-06-07 DIAGNOSIS — R197 Diarrhea, unspecified: Secondary | ICD-10-CM | POA: Diagnosis not present

## 2016-06-10 ENCOUNTER — Other Ambulatory Visit: Payer: Self-pay | Admitting: Internal Medicine

## 2016-06-10 ENCOUNTER — Ambulatory Visit
Admission: RE | Admit: 2016-06-10 | Discharge: 2016-06-10 | Disposition: A | Discharge status: Home / Self Care | Payer: Medicare Other | Source: Ambulatory Visit | Attending: Internal Medicine | Admitting: Internal Medicine

## 2016-06-10 DIAGNOSIS — K8020 Calculus of gallbladder without cholecystitis without obstruction: Secondary | ICD-10-CM | POA: Diagnosis not present

## 2016-06-10 DIAGNOSIS — R1011 Right upper quadrant pain: Secondary | ICD-10-CM

## 2016-06-17 ENCOUNTER — Ambulatory Visit: Payer: Self-pay | Admitting: Surgery

## 2016-06-17 DIAGNOSIS — K429 Umbilical hernia without obstruction or gangrene: Secondary | ICD-10-CM | POA: Diagnosis not present

## 2016-06-17 DIAGNOSIS — K8010 Calculus of gallbladder with chronic cholecystitis without obstruction: Secondary | ICD-10-CM | POA: Diagnosis not present

## 2016-06-17 NOTE — H&P (Signed)
History of Present Illness Derek [name]. Derek Marken MD; 06/17/2016 12:29 PM) The patient is a 75 year old male who presents for evaluation of gall stones. Referred by Dr. Lysle Rubens for symptomatic gallstones  This is a 75 year old male who presents with a two-month history of loose bowel movements. Several weeks ago he had an episode of severe right flank pain and right upper quadrant abdominal pain. His workup included a right upper quadrant ultrasound which showed multiple gallstones. He denies any nausea, vomiting, or abdominal distention. The right upper quadrant abdominal pain has resolved and he is currently asymptomatic.   CLINICAL DATA: Right upper quadrant pain and cough for 1 week, history of chronic lymphocytic leukemia  EXAM: US ABDOMEN LIMITED - RIGHT UPPER QUADRANT  COMPARISON: CT abdomen exams from 12/19/2014 and 09/15/2014, abdominal ultrasound report from 09/03/2002  FINDINGS: Gallbladder:  The gallbladder is physiologically distended in appearance without wall thickening. The gallbladder wall is 3 mm in thickness, within normal limits. There are several dependent layering calculi along the body of the gallbladder, the largest approximately 6 mm. Along the nondependent wall, closer to the neck of the gallbladder is an echogenic non shadowing lesion, vascularity not demonstrated, findings likely representing previously mentioned gallbladder polyps.  Common bile duct:  Diameter: 4 mm  Liver:  The liver parenchyma is coarsened in echotexture without space-occupying mass. No surface contour irregularity.  IMPRESSION: 1. Dependent gallbladder calculi, the largest approximately 6 mm. No secondary signs of acute cholecystitis. 2. 6 mm gallbladder polyp   Electronically Signed By: Ashley Royalty M.D. On: 06/10/2016 13:15    Other Problems (Ammie Eversole, LPN; 0/99/8338 25:05 AM) Cancer  Diagnostic Studies History (Ammie Eversole, LPN; 3/97/6734 19:37  AM) Colonoscopy 1-5 years ago  Allergies Elbert Ewings, CMA; 06/17/2016 10:38 AM) No Known Drug Allergies 06/17/2016  Medication History Elbert Ewings, CMA; 06/17/2016 10:39 AM) Atorvastatin Calcium (40MG  Tablet, Oral) Active. Lisinopril-Hydrochlorothiazide (10-12.5MG  Tablet, Oral) Active. MetFORMIN HCl (1000MG  Tablet, Oral) Active. Metoprolol Succinate ER (25MG  Tablet ER 24HR, Oral) Active. Aspirin (81MG  Tablet, Oral) Active. Fish Oil (1000MG  Capsule DR, Oral) Active. Imdur (30MG  Tablet ER 24HR, Oral) Active. Medications Reconciled  Social History (Ammie Eversole, LPN; 05/21/4096 35:32 AM) Alcohol use Moderate alcohol use. Caffeine use Coffee, Tea. No drug use Tobacco use Former smoker.  Family History Aleatha Borer, LPN; 9/92/4268 34:19 AM) Cancer Father. Heart Disease Mother.     Review of Systems (Ammie Eversole LPN; 03/10/2978 89:21 AM) General Present- Fatigue. Not Present- Appetite Loss, Chills, Fever, Night Sweats, Weight Gain and Weight Loss. Skin Not Present- Change in Wart/Mole, Dryness, Hives, Jaundice, New Lesions, Non-Healing Wounds, Rash and Ulcer. HEENT Present- Ringing in the Ears. Not Present- Earache, Hearing Loss, Hoarseness, Nose Bleed, Oral Ulcers, Seasonal Allergies, Sinus Pain, Sore Throat, Visual Disturbances, Wears glasses/contact lenses and Yellow Eyes. Respiratory Not Present- Bloody sputum, Chronic Cough, Difficulty Breathing, Snoring and Wheezing. Cardiovascular Not Present- Chest Pain, Difficulty Breathing Lying Down, Leg Cramps, Palpitations, Rapid Heart Rate, Shortness of Breath and Swelling of Extremities. Gastrointestinal Present- Change in Bowel Habits. Not Present- Abdominal Pain, Bloating, Bloody Stool, Chronic diarrhea, Constipation, Difficulty Swallowing, Excessive gas, Gets full quickly at meals, Hemorrhoids, Indigestion, Nausea, Rectal Pain and Vomiting. Male Genitourinary Not Present- Blood in Urine, Change in Urinary Stream,  Frequency, Impotence, Nocturia, Painful Urination, Urgency and Urine Leakage.  Vitals Elbert Ewings CMA; 06/17/2016 10:40 AM) 06/17/2016 10:39 AM Weight: 304.4 lb Height: 76in Body Surface Area: 2.65 m Body Mass Index: 37.05 kg/m  Temp.: 98.51F(Temporal)  Pulse: 80 (Regular)  BP:  140/82 (Sitting, Left Arm, Standard)      Physical Exam Rodman Key K. Azaan Leask MD; 06/17/2016 12:30 PM)  The physical exam findings are as follows: Note:Obese in NAD Eyes: Pupils equal, round; sclera anicteric HENT: Oral mucosa moist; good dentition Neck: No masses palpated, no thyromegaly Lungs: CTA bilaterally; normal respiratory effort CV: Regular rate and rhythm; no murmurs; extremities well-perfused with no edema Abd: +bowel sounds, soft, non-tender, no palpable organomegaly; small umbilical hernia - reducible Skin: Warm, dry; no sign of jaundice Psychiatric - alert and oriented x 4; calm mood and affect    Assessment & Plan Rodman Key K. Jerri Hargadon MD; 06/17/2016 12:31 PM)  CHRONIC CHOLECYSTITIS WITH CALCULUS (K80.10)  Current Plans Schedule for Surgery - Laparoscopic cholecystectomy with intraoperative cholangiogram/ repair of umbilical hernia. The surgical procedure has been discussed with the patient. Potential risks, benefits, alternative treatments, and expected outcomes have been explained. All of the patient's questions at this time have been answered. The likelihood of reaching the patient's treatment goal is good. The patient understand the proposed surgical procedure and wishes to proceed. Pt Education - Pamphlet Given - Laparoscopic Gallbladder Surgery: discussed with patient and provided information. UMBILICAL HERNIA WITHOUT OBSTRUCTION OR GANGRENE (K42.9)  Derek [name]. Georgette Dover, MD, [facility]  General/ Trauma Surgery  06/17/2016 12:32 PM

## 2016-07-08 ENCOUNTER — Encounter (HOSPITAL_COMMUNITY): Payer: Self-pay

## 2016-07-08 NOTE — Pre-Procedure Instructions (Addendum)
Derek [name] Derek [name]  07/08/2016      [facility] - [place] - [address] [place] [address] Phone: [phone] Fax: [phone]  [place], [place] - [address] [place] [address] Phone: [phone] Fax: [phone]    Your procedure is scheduled on Tuesday, July 12, 2016  Report to [facility] Admitting at 5:30 A.M.  Call this number if you have problems the morning of surgery:  [phone]   Remember:  Do not eat food or drink liquids after midnight.  Take these medicines the morning of surgery with A SIP OF WATER : None  Stop taking Aspirin,vitamins, fish oil and herbal medications. Do not take any NSAIDs ie: Ibuprofen, Advil, Naproxen, BC and Goody Powder or any medication containing Aspirin; stop now.    How to Manage Your Diabetes Before and After Surgery  Why is it important to control my blood sugar before and after surgery? . Improving blood sugar levels before and after surgery helps healing and can limit problems. . A way of improving blood sugar control is eating a healthy diet by: o  Eating less sugar and carbohydrates o  Increasing activity/exercise o  Talking with your doctor about reaching your blood sugar goals . High blood sugars (greater than 180 mg/dL) can raise your risk of infections and slow your recovery, so you will need to focus on controlling your diabetes during the weeks before surgery. . Make sure that the doctor who takes care of your diabetes knows about your planned surgery including the date and location.  How do I manage my blood sugar before surgery? . Check your blood sugar at least 4 times a day, starting 2 days before surgery, to make sure that the level is not too high or low. o Check your blood sugar the morning of your surgery when you wake up and every 2 hours until you get to the  Short Stay unit. . If your blood sugar is less than 70 mg/dL, you will need to treat for low blood sugar: o Do not take insulin. o Treat a low blood sugar (less than 70 mg/dL) with  cup of clear juice (cranberry or apple), 4 glucose tablets, OR glucose gel. o Recheck blood sugar in 15 minutes after treatment (to make sure it is greater than 70 mg/dL). If your blood sugar is not greater than 70 mg/dL on recheck, call [phone] for further instructions. . Report your blood sugar to the short stay nurse when you get to Short Stay.  . If you are admitted to the hospital after surgery: o Your blood sugar will be checked by the staff and you will probably be given insulin after surgery (instead of oral diabetes medicines) to make sure you have good blood sugar levels. o The goal for blood sugar control after surgery is 80-180 mg/dL.  WHAT DO I DO ABOUT MY DIABETES MEDICATION  Do not take oral diabetes medicines (pills) the morning of surgery such as metFORMIN (GLUCOPHAGE) .   Do not wear jewelry, make-up or nail polish.  Do not wear lotions, powders, or perfumes, or deoderant.  Do not shave 48 hours prior to surgery.  Men may shave face and neck.  Do not bring valuables to the hospital.  [facility] is not responsible for any belongings or valuables.  Contacts, dentures or bridgework may not be worn into  surgery.  Leave your suitcase in the car.  After surgery it may be brought to your room.  For patients admitted to the hospital, discharge time will be determined by your treatment team.  Patients discharged the day of surgery will not be allowed to drive home.   Special instructions: Shower the night before surgery and the morning of surgery with CHG.  Please read over the  fact sheets that you were given.

## 2016-07-11 ENCOUNTER — Encounter (HOSPITAL_COMMUNITY)
Admission: RE | Admit: 2016-07-11 | Discharge: 2016-07-11 | Disposition: A | Discharge status: Home / Self Care | Payer: Medicare Other | Source: Ambulatory Visit | Attending: Surgery | Admitting: Surgery

## 2016-07-11 ENCOUNTER — Encounter (HOSPITAL_COMMUNITY): Payer: Self-pay

## 2016-07-11 DIAGNOSIS — Z79899 Other long term (current) drug therapy: Secondary | ICD-10-CM | POA: Diagnosis not present

## 2016-07-11 DIAGNOSIS — I10 Essential (primary) hypertension: Secondary | ICD-10-CM | POA: Diagnosis not present

## 2016-07-11 DIAGNOSIS — Z7984 Long term (current) use of oral hypoglycemic drugs: Secondary | ICD-10-CM | POA: Diagnosis not present

## 2016-07-11 DIAGNOSIS — K8080 Other cholelithiasis without obstruction: Secondary | ICD-10-CM | POA: Diagnosis present

## 2016-07-11 DIAGNOSIS — E119 Type 2 diabetes mellitus without complications: Secondary | ICD-10-CM | POA: Diagnosis not present

## 2016-07-11 DIAGNOSIS — K429 Umbilical hernia without obstruction or gangrene: Secondary | ICD-10-CM | POA: Diagnosis not present

## 2016-07-11 DIAGNOSIS — C9110 Chronic lymphocytic leukemia of B-cell type not having achieved remission: Secondary | ICD-10-CM | POA: Diagnosis not present

## 2016-07-11 DIAGNOSIS — Z8249 Family history of ischemic heart disease and other diseases of the circulatory system: Secondary | ICD-10-CM | POA: Diagnosis not present

## 2016-07-11 DIAGNOSIS — K219 Gastro-esophageal reflux disease without esophagitis: Secondary | ICD-10-CM | POA: Diagnosis not present

## 2016-07-11 DIAGNOSIS — Z87891 Personal history of nicotine dependence: Secondary | ICD-10-CM | POA: Diagnosis not present

## 2016-07-11 DIAGNOSIS — Z7982 Long term (current) use of aspirin: Secondary | ICD-10-CM | POA: Diagnosis not present

## 2016-07-11 DIAGNOSIS — I2510 Atherosclerotic heart disease of native coronary artery without angina pectoris: Secondary | ICD-10-CM | POA: Diagnosis not present

## 2016-07-11 DIAGNOSIS — K8010 Calculus of gallbladder with chronic cholecystitis without obstruction: Secondary | ICD-10-CM | POA: Diagnosis not present

## 2016-07-11 DIAGNOSIS — E669 Obesity, unspecified: Secondary | ICD-10-CM | POA: Diagnosis not present

## 2016-07-11 DIAGNOSIS — Z6837 Body mass index (BMI) 37.0-37.9, adult: Secondary | ICD-10-CM | POA: Diagnosis not present

## 2016-07-11 HISTORY — DX: Disease of blood and blood-forming organs, unspecified: D75.9

## 2016-07-11 HISTORY — DX: Gastro-esophageal reflux disease without esophagitis: K21.9

## 2016-07-11 LAB — BASIC METABOLIC PANEL
Anion gap: 8 (ref 5–15)
BUN: 13 mg/dL (ref 6–20)
CO2: 26 mmol/L (ref 22–32)
Calcium: 9.4 mg/dL (ref 8.9–10.3)
Chloride: 105 mmol/L (ref 101–111)
Creatinine, Ser: 1.12 mg/dL (ref 0.61–1.24)
GFR calc Af Amer: >60 mL/min (ref >60)
GFR calc non Af Amer: >60 mL/min (ref >60)
Glucose, Bld: 160 mg/dL — ABNORMAL HIGH (ref 65–99)
Potassium: 4.4 mmol/L (ref 3.5–5.1)
Sodium: 139 mmol/L (ref 135–145)

## 2016-07-11 LAB — HEMOGLOBIN A1C
Hgb A1c MFr Bld: 5.9 % — ABNORMAL HIGH (ref 4.8–5.6)
Mean Plasma Glucose: 123 mg/dL

## 2016-07-11 LAB — CBC
HCT: 39.9 % (ref 39.0–52.0)
Hemoglobin: 13.5 g/dL (ref 13.0–17.0)
MCH: 30.8 pg (ref 26.0–34.0)
MCHC: 33.8 g/dL (ref 30.0–36.0)
MCV: 90.9 fL (ref 78.0–100.0)
Platelets: 156 10*3/uL (ref 150–400)
RBC: 4.39 MIL/uL (ref 4.22–5.81)
RDW: 15.7 % — ABNORMAL HIGH (ref 11.5–15.5)
WBC: 7.6 10*3/uL (ref 4.0–10.5)

## 2016-07-11 LAB — GLUCOSE, CAPILLARY: Glucose-Capillary: 160 mg/dL — ABNORMAL HIGH (ref 65–99)

## 2016-07-11 MED ORDER — DEXTROSE 5 % IV SOLN
3.0000 g | INTRAVENOUS | Status: DC
  Filled 2016-07-11: qty 3000

## 2016-07-11 NOTE — Progress Notes (Signed)
   07/11/16 0828  OBSTRUCTIVE SLEEP APNEA  Have you ever been diagnosed with sleep apnea through a sleep study? No  Do you snore loudly (loud enough to be heard through closed doors)?  1 (occ)  Do you often feel tired, fatigued, or sleepy during the daytime (such as falling asleep during driving or talking to someone)? 0  Has anyone observed you stop breathing during your sleep? 0  Do you have, or are you being treated for high blood pressure? 1  BMI more than 35 kg/m2? 1  Age > 50 (1-yes) 1  Neck circumference greater than:Male 16 inches or larger, Male 17inches or larger? 1 (20.5)  Male Gender (Yes=1) 1  Obstructive Sleep Apnea Score 6  Score 5 or greater  Results sent to PCP

## 2016-07-11 NOTE — Anesthesia Preprocedure Evaluation (Addendum)
Anesthesia Evaluation  Patient identified by MRN, date of birth, ID band Patient awake    Reviewed: Allergy & Precautions, NPO status , Patient's Chart, lab work & pertinent test results, reviewed documented beta blocker date and time   History of Anesthesia Complications Negative for: history of anesthetic complications  Airway Mallampati: I  TM Distance: >3 FB Neck ROM: Full    Dental  (+) Edentulous Upper, Edentulous Lower   Pulmonary former smoker (quit 30 years ago),    breath sounds clear to auscultation       Cardiovascular hypertension, Pt. on medications and Pt. on home beta blockers (-) angina+ CAD   Rhythm:Regular Rate:Normal  01/17/13 Cardiac cath (done due to abnormal stress test): IMPRESSIONS: 1. Mid LAD disease immediately after the bifurcation of a large diagonal branch of 85% stenosis, then 15 mm distal to that, 95% calcified stenosis, then distal LAD diffuse disease with lesions of up to 80-90%. Nitroglycerin administered with mild increase in caliber of the distal vessel. There is also small caliber posterior lateral branch 90% proximal stenosis, collateral blood flow. There is small caliber obtuse marginal branch stenosis/occlusion as well with left to left collaterals filling retrograde. See full report for details.  2. Normal left ventricular systolic function. LVEDP 6 mmHg. Ejection fraction 55%. No significant wall motion abnormalities.   Neuro/Psych negative neurological ROS  negative psych ROS   GI/Hepatic Neg liver ROS, GERD  Medicated and Controlled,  Endo/Other  diabetes (glu 145), Oral Hypoglycemic Agents  Renal/GU negative Renal ROS     Musculoskeletal   Abdominal (+) + obese,   Peds  Hematology  (+) Blood dyscrasia (CLL), ,   Anesthesia Other Findings   Reproductive/Obstetrics                            Anesthesia Physical Anesthesia Plan  ASA:  III  Anesthesia Plan: General   Post-op Pain Management:    Induction: Intravenous  Airway Management Planned: Oral ETT  Additional Equipment: Arterial line  Intra-op Plan:   Post-operative Plan: Extubation in OR  Informed Consent: I have reviewed the patients History and Physical, chart, labs and discussed the procedure including the risks, benefits and alternatives for the proposed anesthesia with the patient or authorized representative who has indicated his/her understanding and acceptance.     Plan Discussed with: CRNA and Surgeon  Anesthesia Plan Comments: (Plan routine monitors, A-line, GETA)        Anesthesia Quick Evaluation

## 2016-07-11 NOTE — Progress Notes (Signed)
Anesthesia Chart Review: Patient is a 75 year old male scheduled for cholecystectomy on 07/12/16 by Dr. Georgette Dover. PAT was this morning.  History includes former smoker, CLL (in remission), lymphocytosis, hypertension, hypertriglyceridemia, GERD, varicose vein stripping, nasal sinus surgery, tonsillectomy. BMI is consistent with obesity. OSA screening score was 6.  - PCP is Dr. Wenda Low. - HEM-ONC is Dr. Alvy Bimler.  Cardiologist is Dr. Marlou Porch, last visit 03/17/16. 12 month follow-up recommended. His CAD A/P included: - Coronary artery disease- angina is currently well controlled with both beta blocker as well as long-acting nitrate. Challenging to discern whether or not his prior right-sided arm symptoms are musculoskeletal versus anginal equivalent. I'm unable to reduplicate the discomfort with arm motion or palpation. He states clearly previously that he obtains this discomfort when walking up a hill, stairs in his house, any other exertional activity. At first, when starting metoprolol this seemed to help. I increased his metoprolol to 50 mg and this helped a little bit.. Isosorbide 30. Aggressive secondary prevention. Disease as described above. Reviewed. I discussed the case previously with Dr. Irish Lack.  I think the main thing is to continue with secondary prevention efforts such as weight loss, conditioning.  - Angina-atypical right arm pain with motion. Could be anginal symptoms or perhaps musculoskeletal (not as convinced). Shortness of breath multifactorial. No further cardiac testing at this time.  Meds include aspirin 81 mg, Lipitor, fish oil, lisinopril-HCTZ, metformin, Toprol-XL.  BP (!) 125/53   Pulse (!) 52   Temp 37 C   Resp 20   Ht 6\' 4"  (1.93 m)   Wt (!) 307 lb 11.2 oz (139.6 kg)   SpO2 98%   BMI 37.45 kg/m   03/17/16 EKG ([facility]): SB at 59 bpm, sinus arrhythmia, LAFB.  01/17/13 Cardiac cath (done due to abnormal stress test): IMPRESSIONS: 1. Mid LAD disease  immediately after the bifurcation of a large diagonal branch of 85% stenosis, then 15 mm distal to that, 95% calcified stenosis, then distal LAD diffuse disease with lesions of up to 80-90%. Nitroglycerin administered with mild increase in caliber of the distal vessel. There is also small caliber posterior lateral branch 90% proximal stenosis, collateral blood flow. There is small caliber obtuse marginal branch stenosis/occlusion as well with left to left collaterals filling retrograde. See full report for details.  2. Normal left ventricular systolic function.  LVEDP 6 mmHg.  Ejection fraction 55%. No significant wall motion abnormalities. RECOMMENDATION:  We will first initiate metoprolol as well as isosorbide to provide anti anginal therapy. We will reevaluate next week. I discussed case with Dr. Irish Lack of interventional cardiology. We discussed the potential plan for percutaneous intervention to the mid LAD segment as well as intervention to the proximal/mid large caliber diagonal branch and potential balloon angioplasty to distal LAD. Once again, nuclear stress test was deemed moderate risk-he did have mild electrical instability with brief run of possible ventricular tachycardia that was low and he was also noted to have significant dyspnea on exertion with minimal effort, CCS class III. (Ultimately, a decision was made for aggressive medical management.)  By cardiology notes, patient stress test (done ~ 12/2012) showed "evidence of mild ischemia in the basal inferolateral to mid inferolateral region. Felt to be a moderate risk myocardial perfusion study. He also had during exercise a small/brief run of possible ventricular tachycardia that was slow and was noted to have significant dyspnea on exertion."  06/07/16 CXR: IMPRESSION: No active cardiopulmonary disease.  Preoperative labs noted. WBC 7.6. PLT 156, H/H  13.5/39.9. Cr 1.12, glucose 160.  Patient with cardiology follow-up within the past six  months. Continued medical therapy without additional cardiac testing recommended at that time. Reviewed above with anesthesiologist Dr. Gifford Shave. Further evaluation on the day of surgery, but if no new/progressive CV/CHF symptoms then it is anticipated that he can proceed as planned.  [facility] Short Stay Center/Anesthesiology Phone [phone] 07/11/2016 12:15 PM

## 2016-07-12 ENCOUNTER — Encounter (HOSPITAL_COMMUNITY): Payer: Self-pay | Admitting: *Deleted

## 2016-07-12 ENCOUNTER — Ambulatory Visit (HOSPITAL_COMMUNITY): Payer: Medicare Other | Admitting: Anesthesiology

## 2016-07-12 ENCOUNTER — Encounter (HOSPITAL_COMMUNITY)
Admission: RE | Disposition: A | Discharge status: Home / Self Care | Payer: Self-pay | Source: Ambulatory Visit | Attending: Surgery

## 2016-07-12 ENCOUNTER — Ambulatory Visit (HOSPITAL_COMMUNITY): Payer: Medicare Other | Admitting: Vascular Surgery

## 2016-07-12 ENCOUNTER — Ambulatory Visit (HOSPITAL_COMMUNITY)
Admission: RE | Admit: 2016-07-12 | Discharge: 2016-07-12 | Disposition: A | Discharge status: Home / Self Care | Payer: Medicare Other | Source: Ambulatory Visit | Attending: Surgery | Admitting: Surgery

## 2016-07-12 DIAGNOSIS — K8010 Calculus of gallbladder with chronic cholecystitis without obstruction: Secondary | ICD-10-CM | POA: Diagnosis not present

## 2016-07-12 DIAGNOSIS — K429 Umbilical hernia without obstruction or gangrene: Secondary | ICD-10-CM | POA: Insufficient documentation

## 2016-07-12 DIAGNOSIS — Z7984 Long term (current) use of oral hypoglycemic drugs: Secondary | ICD-10-CM | POA: Diagnosis not present

## 2016-07-12 DIAGNOSIS — Z79899 Other long term (current) drug therapy: Secondary | ICD-10-CM | POA: Diagnosis not present

## 2016-07-12 DIAGNOSIS — E119 Type 2 diabetes mellitus without complications: Secondary | ICD-10-CM | POA: Insufficient documentation

## 2016-07-12 DIAGNOSIS — I10 Essential (primary) hypertension: Secondary | ICD-10-CM | POA: Diagnosis not present

## 2016-07-12 DIAGNOSIS — E669 Obesity, unspecified: Secondary | ICD-10-CM | POA: Insufficient documentation

## 2016-07-12 DIAGNOSIS — K811 Chronic cholecystitis: Secondary | ICD-10-CM | POA: Diagnosis not present

## 2016-07-12 DIAGNOSIS — Z7982 Long term (current) use of aspirin: Secondary | ICD-10-CM | POA: Insufficient documentation

## 2016-07-12 DIAGNOSIS — Z6837 Body mass index (BMI) 37.0-37.9, adult: Secondary | ICD-10-CM | POA: Insufficient documentation

## 2016-07-12 DIAGNOSIS — Z87891 Personal history of nicotine dependence: Secondary | ICD-10-CM | POA: Diagnosis not present

## 2016-07-12 DIAGNOSIS — I2510 Atherosclerotic heart disease of native coronary artery without angina pectoris: Secondary | ICD-10-CM | POA: Insufficient documentation

## 2016-07-12 DIAGNOSIS — K219 Gastro-esophageal reflux disease without esophagitis: Secondary | ICD-10-CM | POA: Insufficient documentation

## 2016-07-12 DIAGNOSIS — C9110 Chronic lymphocytic leukemia of B-cell type not having achieved remission: Secondary | ICD-10-CM | POA: Insufficient documentation

## 2016-07-12 DIAGNOSIS — Z8249 Family history of ischemic heart disease and other diseases of the circulatory system: Secondary | ICD-10-CM | POA: Insufficient documentation

## 2016-07-12 HISTORY — PX: CHOLECYSTECTOMY: SHX55

## 2016-07-12 HISTORY — PX: UMBILICAL HERNIA REPAIR: SHX196

## 2016-07-12 LAB — GLUCOSE, CAPILLARY
Glucose-Capillary: 145 mg/dL — ABNORMAL HIGH (ref 65–99)
Glucose-Capillary: 191 mg/dL — ABNORMAL HIGH (ref 65–99)

## 2016-07-12 SURGERY — LAPAROSCOPIC CHOLECYSTECTOMY
Anesthesia: General | Site: Abdomen

## 2016-07-12 MED ORDER — HYDROMORPHONE HCL 1 MG/ML IJ SOLN: 0.2500 mg | INTRAMUSCULAR | Status: DC | PRN

## 2016-07-12 MED ORDER — LIDOCAINE HCL (CARDIAC) 20 MG/ML IV SOLN
INTRAVENOUS | Status: DC | PRN
  Administered 2016-07-12: 20 mg via INTRAVENOUS

## 2016-07-12 MED ORDER — BUPIVACAINE-EPINEPHRINE (PF) 0.5% -1:200000 IJ SOLN
INTRAMUSCULAR | Status: AC
  Filled 2016-07-12: qty 30

## 2016-07-12 MED ORDER — LACTATED RINGERS IV SOLN
INTRAVENOUS | Status: DC | PRN
  Administered 2016-07-12: 07:00:00 via INTRAVENOUS

## 2016-07-12 MED ORDER — BUPIVACAINE HCL 0.5 % IJ SOLN
INTRAMUSCULAR | Status: DC | PRN
  Administered 2016-07-12: 11 mL

## 2016-07-12 MED ORDER — ESMOLOL HCL 100 MG/10ML IV SOLN
INTRAVENOUS | Status: DC | PRN
  Administered 2016-07-12: 20 mg via INTRAVENOUS
  Administered 2016-07-12: 30 mg via INTRAVENOUS
  Administered 2016-07-12: 20 mg via INTRAVENOUS
  Administered 2016-07-12: 30 mg via INTRAVENOUS

## 2016-07-12 MED ORDER — SODIUM CHLORIDE 0.9 % IR SOLN
Status: DC | PRN
Start: 2016-07-12 — End: 2016-07-12
  Administered 2016-07-12: 1000 mL

## 2016-07-12 MED ORDER — MIDAZOLAM HCL 2 MG/2ML IJ SOLN: 0.5000 mg | Freq: Once | INTRAMUSCULAR | Status: DC | PRN

## 2016-07-12 MED ORDER — FENTANYL CITRATE (PF) 100 MCG/2ML IJ SOLN
INTRAMUSCULAR | Status: AC
  Filled 2016-07-12: qty 2

## 2016-07-12 MED ORDER — CEFAZOLIN SODIUM 1 G IJ SOLR
INTRAMUSCULAR | Status: DC | PRN
  Administered 2016-07-12: 3 g via INTRAMUSCULAR

## 2016-07-12 MED ORDER — CHLORHEXIDINE GLUCONATE CLOTH 2 % EX PADS: 6.0000 | MEDICATED_PAD | Freq: Once | CUTANEOUS | Status: DC

## 2016-07-12 MED ORDER — PROPOFOL 10 MG/ML IV BOLUS
INTRAVENOUS | Status: DC | PRN
  Administered 2016-07-12: 200 mg via INTRAVENOUS

## 2016-07-12 MED ORDER — PROPOFOL 10 MG/ML IV BOLUS
INTRAVENOUS | Status: AC
  Filled 2016-07-12: qty 20

## 2016-07-12 MED ORDER — FENTANYL CITRATE (PF) 100 MCG/2ML IJ SOLN
INTRAMUSCULAR | Status: DC | PRN
  Administered 2016-07-12 (×3): 100 ug via INTRAVENOUS

## 2016-07-12 MED ORDER — OXYCODONE HCL 5 MG PO TABS: 5.0000 mg | ORAL_TABLET | Freq: Four times a day (QID) | ORAL | 0 refills | Status: DC | PRN

## 2016-07-12 MED ORDER — ROCURONIUM BROMIDE 100 MG/10ML IV SOLN
INTRAVENOUS | Status: DC | PRN
  Administered 2016-07-12: 10 mg via INTRAVENOUS
  Administered 2016-07-12: 50 mg via INTRAVENOUS
  Administered 2016-07-12: 10 mg via INTRAVENOUS

## 2016-07-12 MED ORDER — SUGAMMADEX SODIUM 500 MG/5ML IV SOLN
INTRAVENOUS | Status: DC | PRN
  Administered 2016-07-12: 300 mg via INTRAVENOUS

## 2016-07-12 MED ORDER — PHENYLEPHRINE HCL 10 MG/ML IJ SOLN
INTRAMUSCULAR | Status: DC | PRN
  Administered 2016-07-12: 80 ug via INTRAVENOUS

## 2016-07-12 MED ORDER — PROMETHAZINE HCL 25 MG/ML IJ SOLN: 6.2500 mg | INTRAMUSCULAR | Status: DC | PRN

## 2016-07-12 MED ORDER — 0.9 % SODIUM CHLORIDE (POUR BTL) OPTIME
TOPICAL | Status: DC | PRN
  Administered 2016-07-12: 1000 mL

## 2016-07-12 MED ORDER — SUCCINYLCHOLINE CHLORIDE 20 MG/ML IJ SOLN
INTRAMUSCULAR | Status: DC | PRN
  Administered 2016-07-12: 140 mg via INTRAVENOUS

## 2016-07-12 MED ORDER — IOPAMIDOL (ISOVUE-300) INJECTION 61%
INTRAVENOUS | Status: AC
Start: 2016-07-12 — End: 2016-07-12
  Filled 2016-07-12: qty 50

## 2016-07-12 MED ORDER — MEPERIDINE HCL 25 MG/ML IJ SOLN: 6.2500 mg | INTRAMUSCULAR | Status: DC | PRN

## 2016-07-12 MED ORDER — FENTANYL CITRATE (PF) 100 MCG/2ML IJ SOLN
INTRAMUSCULAR | Status: AC
  Filled 2016-07-12: qty 4

## 2016-07-12 MED ORDER — SODIUM CHLORIDE 0.9 % IV SOLN
INTRAVENOUS | Status: DC | PRN
  Administered 2016-07-12: 50 mL

## 2016-07-12 MED ORDER — ONDANSETRON HCL 4 MG/2ML IJ SOLN
INTRAMUSCULAR | Status: DC | PRN
  Administered 2016-07-12: 4 mg via INTRAVENOUS

## 2016-07-12 SURGICAL SUPPLY — 69 items
APL SKNCLS STERI-STRIP NONHPOA (GAUZE/BANDAGES/DRESSINGS) ×2
APPLIER CLIP ROT 10 11.4 M/L (STAPLE) ×3
APR CLP MED LRG 11.4X10 (STAPLE) ×2
BAG SPEC RTRVL LRG 6X4 10 (ENDOMECHANICALS) ×2
BENZOIN TINCTURE PRP APPL 2/3 (GAUZE/BANDAGES/DRESSINGS) ×3 IMPLANT
BLADE SURG 15 STRL LF DISP TIS (BLADE) ×1 IMPLANT
BLADE SURG 15 STRL SS (BLADE)
BLADE SURG ROTATE 9660 (MISCELLANEOUS) ×2 IMPLANT
CANISTER SUCTION 2500CC (MISCELLANEOUS) ×3 IMPLANT
CHLORAPREP W/TINT 26ML (MISCELLANEOUS) ×3 IMPLANT
CLIP APPLIE ROT 10 11.4 M/L (STAPLE) ×2 IMPLANT
COVER MAYO STAND STRL (DRAPES) ×3 IMPLANT
COVER SURGICAL LIGHT HANDLE (MISCELLANEOUS) ×3 IMPLANT
DRAPE C-ARM 42X72 X-RAY (DRAPES) ×3 IMPLANT
DRAPE LAPAROTOMY 100X72 PEDS (DRAPES) ×1 IMPLANT
DRAPE UTILITY XL STRL (DRAPES) ×1 IMPLANT
DRSG TEGADERM 2-3/8X2-3/4 SM (GAUZE/BANDAGES/DRESSINGS) ×9 IMPLANT
DRSG TEGADERM 4X4.75 (GAUZE/BANDAGES/DRESSINGS) ×3 IMPLANT
ELECT CAUTERY BLADE 6.4 (BLADE) ×1 IMPLANT
ELECT REM PT RETURN 9FT ADLT (ELECTROSURGICAL) ×3
ELECTRODE REM PT RTRN 9FT ADLT (ELECTROSURGICAL) ×2 IMPLANT
FILTER SMOKE EVAC LAPAROSHD (FILTER) ×3 IMPLANT
GAUZE SPONGE 2X2 8PLY STRL LF (GAUZE/BANDAGES/DRESSINGS) ×2 IMPLANT
GAUZE SPONGE 4X4 16PLY XRAY LF (GAUZE/BANDAGES/DRESSINGS) ×1 IMPLANT
GLOVE BIO SURGEON STRL SZ7 (GLOVE) ×3 IMPLANT
GLOVE BIOGEL PI IND STRL 6.5 (GLOVE) ×1 IMPLANT
GLOVE BIOGEL PI IND STRL 7.0 (GLOVE) ×1 IMPLANT
GLOVE BIOGEL PI IND STRL 7.5 (GLOVE) ×3 IMPLANT
GLOVE BIOGEL PI INDICATOR 6.5 (GLOVE) ×1
GLOVE BIOGEL PI INDICATOR 7.0 (GLOVE) ×1
GLOVE BIOGEL PI INDICATOR 7.5 (GLOVE) ×2
GLOVE ECLIPSE 7.0 STRL STRAW (GLOVE) ×2 IMPLANT
GLOVE ECLIPSE 7.5 STRL STRAW (GLOVE) ×2 IMPLANT
GLOVE SURG SS PI 6.0 STRL IVOR (GLOVE) ×4 IMPLANT
GOWN STRL REUS W/ TWL LRG LVL3 (GOWN DISPOSABLE) ×7 IMPLANT
GOWN STRL REUS W/TWL LRG LVL3 (GOWN DISPOSABLE) ×12
KIT BASIN OR (CUSTOM PROCEDURE TRAY) ×3 IMPLANT
KIT ROOM TURNOVER OR (KITS) ×3 IMPLANT
NDL HYPO 25GX1X1/2 BEV (NEEDLE) ×1 IMPLANT
NEEDLE HYPO 25GX1X1/2 BEV (NEEDLE) IMPLANT
NS IRRIG 1000ML POUR BTL (IV SOLUTION) ×3 IMPLANT
PACK SURGICAL SETUP 50X90 (CUSTOM PROCEDURE TRAY) ×1 IMPLANT
PAD ARMBOARD 7.5X6 YLW CONV (MISCELLANEOUS) ×3 IMPLANT
PENCIL BUTTON HOLSTER BLD 10FT (ELECTRODE) ×1 IMPLANT
POUCH SPECIMEN RETRIEVAL 10MM (ENDOMECHANICALS) ×3 IMPLANT
SCISSORS LAP 5X35 DISP (ENDOMECHANICALS) ×3 IMPLANT
SET CHOLANGIOGRAPH 5 50 .035 (SET/KITS/TRAYS/PACK) ×3 IMPLANT
SET IRRIG TUBING LAPAROSCOPIC (IRRIGATION / IRRIGATOR) ×3 IMPLANT
SLEEVE ENDOPATH XCEL 5M (ENDOMECHANICALS) ×3 IMPLANT
SPECIMEN JAR SMALL (MISCELLANEOUS) ×3 IMPLANT
SPONGE GAUZE 2X2 STER 10/PKG (GAUZE/BANDAGES/DRESSINGS) ×1
STRIP CLOSURE SKIN 1/2X4 (GAUZE/BANDAGES/DRESSINGS) ×3 IMPLANT
SUT MNCRL AB 4-0 PS2 18 (SUTURE) ×3 IMPLANT
SUT NOVA NAB DX-16 0-1 5-0 T12 (SUTURE) ×1 IMPLANT
SUT NOVA NAB GS-21 0 18 T12 DT (SUTURE) ×3 IMPLANT
SUT VIC AB 3-0 SH 27 (SUTURE) ×3
SUT VIC AB 3-0 SH 27X BRD (SUTURE) ×1 IMPLANT
SUT VIC AB 3-0 SH 27XBRD (SUTURE) ×1 IMPLANT
SYR BULB 3OZ (MISCELLANEOUS) ×1 IMPLANT
SYR CONTROL 10ML LL (SYRINGE) IMPLANT
TOWEL OR 17X24 6PK STRL BLUE (TOWEL DISPOSABLE) ×3 IMPLANT
TOWEL OR 17X26 10 PK STRL BLUE (TOWEL DISPOSABLE) ×1 IMPLANT
TRAY LAPAROSCOPIC MC (CUSTOM PROCEDURE TRAY) ×3 IMPLANT
TROCAR XCEL BLUNT TIP 100MML (ENDOMECHANICALS) ×3 IMPLANT
TROCAR XCEL NON-BLD 11X100MML (ENDOMECHANICALS) ×3 IMPLANT
TROCAR XCEL NON-BLD 5MMX100MML (ENDOMECHANICALS) ×3 IMPLANT
TUBE CONNECTING 12X1/4 (SUCTIONS) IMPLANT
TUBING INSUFFLATION (TUBING) ×3 IMPLANT
YANKAUER SUCT BULB TIP NO VENT (SUCTIONS) IMPLANT

## 2016-07-12 NOTE — Discharge Instructions (Signed)
[facility] °LAPAROSCOPIC SURGERY: POST OP INSTRUCTIONS °Always review your discharge instruction sheet given to you by the facility where your surgery was performed. °IF YOU HAVE DISABILITY OR FAMILY LEAVE FORMS, YOU MUST BRING THEM TO THE OFFICE FOR PROCESSING.   °DO NOT GIVE THEM TO YOUR DOCTOR. ° °1. A prescription for pain medication will be given to you upon discharge.  Take your pain medication as prescribed, if needed.  If narcotic pain medicine is not needed, then you may take acetaminophen (Tylenol) or ibuprofen (Advil) as needed. °2. Take your usually prescribed medications unless otherwise directed. °3. If you need a refill on your pain medication, please contact your pharmacy.  They will contact our office to request authorization. Prescriptions will not be filled after 5pm or on week-ends. °4. You should follow a light diet the first few days after arrival home, such as soup and crackers, etc.  Be sure to include lots of fluids daily. °5. Most patients will experience some swelling and bruising in the area of the incisions.  Ice packs will help.  Swelling and bruising can take several days to resolve.  °6. It is common to experience some constipation if taking pain medication after surgery.  Increasing fluid intake and taking a stool softener (such as Colace) will usually help or prevent this problem from occurring.  A mild laxative (Milk of Magnesia or Miralax) should be taken according to package instructions if there are no bowel movements after 48 hours. °7. Unless discharge instructions indicate otherwise, you may remove your bandages 48 hours after surgery, and you may shower at that time.  You will have steri-strips (small skin tapes) in place directly over the incision.  These strips should be left on the skin for 7-10 days.  If your surgeon used skin glue on the incision, you may shower in 24 hours.  The glue will flake off over the next 2-3 weeks.  Any sutures or staples  will be removed at the office during your follow-up visit. °8. ACTIVITIES:  You may resume regular (light) daily activities beginning the next day--such as daily self-care, walking, climbing stairs--gradually increasing activities as tolerated.  You may have sexual intercourse when it is comfortable.  Refrain from any heavy lifting or straining until approved by your doctor. °a. You may drive when you are no longer taking prescription pain medication, you can comfortably wear a seatbelt, and you can safely maneuver your car and apply brakes. °b. RETURN TO WORK:   2-3 weeks °9. You should see your doctor in the office for a follow-up appointment approximately 2-3 weeks after your surgery.  Make sure that you call for this appointment within a day or two after you arrive home to insure a convenient appointment time. °10. OTHER INSTRUCTIONS: ________________________________________________________________________ °WHEN TO CALL YOUR DOCTOR: °1. Fever over 101.0 °2. Inability to urinate °3. Continued bleeding from incision. °4. Increased pain, redness, or drainage from the incision. °5. Increasing abdominal pain ° °The clinic staff is available to answer your questions during regular business hours.  Please don’t hesitate to call and ask to speak to one of the nurses for clinical concerns.  If you have a medical emergency, go to the nearest emergency room or call 911.  A surgeon from [facility] is always on call at the hospital. °[address], [address], [place], [place]  [address] ? [address] [address], [place], [place]   [address] °[phone] ? [phone] ? FAX [phone] °Web site:   [email or web address] ° °

## 2016-07-12 NOTE — H&P (View-Only) (Signed)
History of Present Illness Derek [name]. Derek Luevanos MD; 06/17/2016 12:29 PM) The patient is a 75 year old male who presents for evaluation of gall stones. Referred by Dr. Lysle [name] for symptomatic gallstones  This is a 75 year old male who presents with a two-month history of loose bowel movements. Several weeks ago he had an episode of severe right flank pain and right upper quadrant abdominal pain. His workup included a right upper quadrant ultrasound which showed multiple gallstones. He denies any nausea, vomiting, or abdominal distention. The right upper quadrant abdominal pain has resolved and he is currently asymptomatic.   CLINICAL DATA: Right upper quadrant pain and cough for 1 week, history of chronic lymphocytic leukemia  EXAM: US ABDOMEN LIMITED - RIGHT UPPER QUADRANT  COMPARISON: CT abdomen exams from 12/19/2014 and 09/15/2014, abdominal ultrasound report from 09/03/2002  FINDINGS: Gallbladder:  The gallbladder is physiologically distended in appearance without wall thickening. The gallbladder wall is 3 mm in thickness, within normal limits. There are several dependent layering calculi along the body of the gallbladder, the largest approximately 6 mm. Along the nondependent wall, closer to the neck of the gallbladder is an echogenic non shadowing lesion, vascularity not demonstrated, findings likely representing previously mentioned gallbladder polyps.  Common bile duct:  Diameter: 4 mm  Liver:  The liver parenchyma is coarsened in echotexture without space-occupying mass. No surface contour irregularity.  IMPRESSION: 1. Dependent gallbladder calculi, the largest approximately 6 mm. No secondary signs of acute cholecystitis. 2. 6 mm gallbladder polyp   Electronically Signed By: Derek [name] M.D. On: 06/10/2016 13:15    Other Problems (Derek Eversole, LPN; 12/08/252 27:06 AM) Cancer  Diagnostic Studies History (Derek Eversole, LPN; 2/37/6283 15:17  AM) Colonoscopy 1-5 years ago  Allergies Derek [name], [name]; 06/17/2016 10:38 AM) No Known Drug Allergies 06/17/2016  Medication History Derek [name], [name]; 06/17/2016 10:39 AM) Atorvastatin Calcium (40MG  Tablet, Oral) Active. Lisinopril-Hydrochlorothiazide (10-12.5MG  Tablet, Oral) Active. MetFORMIN HCl (1000MG  Tablet, Oral) Active. Metoprolol Succinate ER (25MG  Tablet ER 24HR, Oral) Active. Aspirin (81MG  Tablet, Oral) Active. Fish Oil (1000MG  Capsule DR, Oral) Active. Imdur (30MG  Tablet ER 24HR, Oral) Active. Medications Reconciled  Social History (Derek Eversole, LPN; 03/04/736 10:62 AM) Alcohol use Moderate alcohol use. Caffeine use Coffee, Tea. No drug use Tobacco use Former smoker.  Family History Derek Borer, LPN; 6/94/8546 27:03 AM) Cancer Father. Heart Disease Mother.     Review of Systems (Derek Eversole LPN; 5/00/9381 82:99 AM) General Present- Fatigue. Not Present- Appetite Loss, Chills, Fever, Night Sweats, Weight Gain and Weight Loss. Skin Not Present- Change in Wart/Mole, Dryness, Hives, Jaundice, New Lesions, Non-Healing Wounds, Rash and Ulcer. HEENT Present- Ringing in the Ears. Not Present- Earache, Hearing Loss, Hoarseness, Nose Bleed, Oral Ulcers, Seasonal Allergies, Sinus Pain, Sore Throat, Visual Disturbances, Wears glasses/contact lenses and Yellow Eyes. Respiratory Not Present- Bloody sputum, Chronic Cough, Difficulty Breathing, Snoring and Wheezing. Cardiovascular Not Present- Chest Pain, Difficulty Breathing Lying Down, Leg Cramps, Palpitations, Rapid Heart Rate, Shortness of Breath and Swelling of Extremities. Gastrointestinal Present- Change in Bowel Habits. Not Present- Abdominal Pain, Bloating, Bloody Stool, Chronic diarrhea, Constipation, Difficulty Swallowing, Excessive gas, Gets full quickly at meals, Hemorrhoids, Indigestion, Nausea, Rectal Pain and Vomiting. Male Genitourinary Not Present- Blood in Urine, Change in Urinary Stream,  Frequency, Impotence, Nocturia, Painful Urination, Urgency and Urine Leakage.  Vitals Derek [name]; 06/17/2016 10:40 AM) 06/17/2016 10:39 AM Weight: 304.4 lb Height: 76in Body Surface Area: 2.65 m Body Mass Index: 37.05 kg/m  Temp.: 98.90F(Temporal)  Pulse: 80 (Regular)  BP:  140/82 (Sitting, Left Arm, Standard)      Physical Exam Derek Key K. Demaria Deeney MD; 06/17/2016 12:30 PM)  The physical exam findings are as follows: Note:Obese in NAD Eyes: Pupils equal, round; sclera anicteric HENT: Oral mucosa moist; good dentition Neck: No masses palpated, no thyromegaly Lungs: CTA bilaterally; normal respiratory effort CV: Regular rate and rhythm; no murmurs; extremities well-perfused with no edema Abd: +bowel sounds, soft, non-tender, no palpable organomegaly; small umbilical hernia - reducible Skin: Warm, dry; no sign of jaundice Psychiatric - alert and oriented x 4; calm mood and affect    Assessment & Plan Derek Key K. Lonald Troiani MD; 06/17/2016 12:31 PM)  CHRONIC CHOLECYSTITIS WITH CALCULUS (K80.10)  Current Plans Schedule for Surgery - Laparoscopic cholecystectomy with intraoperative cholangiogram/ repair of umbilical hernia. The surgical procedure has been discussed with the patient. Potential risks, benefits, alternative treatments, and expected outcomes have been explained. All of the patient's questions at this time have been answered. The likelihood of reaching the patient's treatment goal is good. The patient understand the proposed surgical procedure and wishes to proceed. Pt Education - Pamphlet Given - Laparoscopic Gallbladder Surgery: discussed with patient and provided information. UMBILICAL HERNIA WITHOUT OBSTRUCTION OR GANGRENE (K42.9)  Derek [name]. Derek Dover, MD, [facility]  General/ Trauma Surgery  06/17/2016 12:32 PM

## 2016-07-12 NOTE — Anesthesia Procedure Notes (Signed)
Anesthesia Procedure Note Time out, sterile prep, drape L wrist, #20ga AC L rad artery 1st pass, sterile dressing on, pt tolerated well   Jenita Seashore, MD

## 2016-07-12 NOTE — Anesthesia Procedure Notes (Signed)
Procedure Name: Intubation Date/Time: 07/12/2016 7:45 AM Performed by: Salli Quarry Castiel Lauricella Pre-anesthesia Checklist: Patient identified, Emergency Drugs available, Suction available and Patient being monitored Patient Re-evaluated:Patient Re-evaluated prior to inductionOxygen Delivery Method: Circle System Utilized Preoxygenation: Pre-oxygenation with 100% oxygen Intubation Type: IV induction Ventilation: Mask ventilation with difficulty and Two handed mask ventilation required Laryngoscope Size: Miller and 3 Grade View: Grade I Tube type: Oral Tube size: 7.5 mm Number of attempts: 1 Airway Equipment and Method: Stylet and Oral airway Placement Confirmation: ETT inserted through vocal cords under direct vision,  positive ETCO2 and breath sounds checked- equal and bilateral Tube secured with: Tape Dental Injury: Teeth and Oropharynx as per pre-operative assessment

## 2016-07-12 NOTE — Progress Notes (Signed)
Patient did not take Lopressor last night. Held Lopressor this morning to pt. HR currently 56. All other vitals within normal limits.

## 2016-07-12 NOTE — Interval H&P Note (Signed)
History and Physical Interval Note:  07/12/2016 7:11 AM  Derek [name]  has presented today for surgery, with the diagnosis of CHRONIC CALCULUS CHOLECYSTITIS, UMBILICAL HERNIA  The various methods of treatment have been discussed with the patient and family. After consideration of risks, benefits and other options for treatment, the patient has consented to  Procedure(s): LAPAROSCOPIC CHOLECYSTECTOMY WITH INTRAOPERATIVE CHOLANGIOGRAM (N/A) [place] (N/A) as a surgical intervention .  The patient's history has been reviewed, patient examined, no change in status, stable for surgery.  I have reviewed the patient's chart and labs.  Questions were answered to the patient's satisfaction.     [name] K.

## 2016-07-12 NOTE — Op Note (Signed)
Laparoscopic Cholecystectomy/ repair of umbilical hernia Procedure Note  Indications: This patient presents with symptomatic gallbladder disease and will undergo laparoscopic cholecystectomy.  Pre-operative Diagnosis: Calculus of gallbladder with other cholecystitis, without mention of obstruction/ umbilical hernia  Post-operative Diagnosis: Same  Surgeon: Kisha Messman K.   Assistants: Judyann Munson, RNFA  Anesthesia: General endotracheal anesthesia  ASA Class: 2  Procedure Details  The patient was seen again in the Holding Room. The risks, benefits, complications, treatment options, and expected outcomes were discussed with the patient. The possibilities of reaction to medication, pulmonary aspiration, perforation of viscus, bleeding, recurrent infection, finding a normal gallbladder, the need for additional procedures, failure to diagnose a condition, the possible need to convert to an open procedure, and creating a complication requiring transfusion or operation were discussed with the patient. The likelihood of improving the patient's symptoms with return to their baseline status is good.  The patient and/or family concurred with the proposed plan, giving informed consent. The site of surgery properly noted. The patient was taken to Operating Room, identified as Derek [name] and the procedure verified as Laparoscopic Cholecystectomy with Intraoperative Cholangiogram. A Time Out was held and the above information confirmed.  Prior to the induction of general anesthesia, antibiotic prophylaxis was administered. General endotracheal anesthesia was then administered and tolerated well. After the induction, the abdomen was prepped with Chloraprep and draped in sterile fashion. The patient was positioned in the supine position.  Local anesthetic agent was injected into the skin above the umbilicus and an incision made. We dissected down to the umbilical hernia sac with blunt dissection.  We  dissected the hernia sac away from the surrounding tissue and then opened the hernia sac.  The fascial opening was about 1 cm.  A pursestring suture of 0-Vicryl was placed around the fascial opening.  The Hasson cannula was inserted and secured with the stay suture.  Pneumoperitoneum was then created with CO2 and tolerated well without any adverse changes in the patient's vital signs. An 11-mm port was placed in the subxiphoid position.  Two 5-mm ports were placed in the right upper quadrant. All skin incisions were infiltrated with a local anesthetic agent before making the incision and placing the trocars.   We positioned the patient in reverse Trendelenburg, tilted slightly to the patient's left.  The gallbladder was identified, the fundus grasped and retracted cephalad. There are signfiicant adhesions to the surface of the gallbladder and to the edge of the liver.  Adhesions were lysed bluntly and with the electrocautery where indicated, taking care not to injure any adjacent organs or viscus. The infundibulum was grasped and retracted laterally, exposing the peritoneum overlying the triangle of Calot. This was then divided and exposed in a blunt fashion. The cystic duct was clearly identified and bluntly dissected circumferentially. A critical view of the cystic duct and cystic artery was obtained.  The cystic duct appeared too small in diameter for a cholangiogram catheter, so we did not attempt a cholangiogram.  The cystic duct was then ligated with clips and divided. The cystic artery was, dissected free, ligated with clips and divided as well.   The gallbladder was dissected from the liver bed in retrograde fashion with the electrocautery. The gallbladder was removed and placed in an Endocatch sac. The liver bed was irrigated and inspected. Hemostasis was achieved with the electrocautery. Copious irrigation was utilized and was repeatedly aspirated until clear.  The gallbladder and Endocatch sac were  then removed through the umbilical port site.  The umbilical fascia was then closed with interrupted 0 Novofil sutures.    We again inspected the right upper quadrant for hemostasis.  Pneumoperitoneum was released as we removed the trocars.   3-0 Monocryl was used to close the subcutaneous tissues at the umbilicus. 4-0 Monocryl was used to close the skin.   Benzoin, steri-strips, and clean dressings were applied. The patient was then extubated and brought to the recovery room in stable condition. Instrument, sponge, and needle counts were correct at closure and at the conclusion of the case.   Findings: Cholecystitis with Cholelithiasis  Estimated Blood Loss: Minimal         Drains: none         Specimens: Gallbladder           Complications: None; patient tolerated the procedure well.         Disposition: PACU - hemodynamically stable.         Condition: stable  Imogene Burn. Georgette Dover, MD, [facility]  General/ Trauma Surgery  07/12/2016 8:54 AM

## 2016-07-12 NOTE — Transfer of Care (Signed)
Immediate Anesthesia Transfer of Care Note  Patient: Derek [name]  Procedure(s) Performed: Procedure(s): LAPAROSCOPIC CHOLECYSTECTOMY (N/A) UMBILICAL HERINA REPAIR (N/A)  Patient Location: PACU  Anesthesia Type:General  Level of Consciousness: awake, alert , oriented and patient cooperative  Airway & Oxygen Therapy: Patient Spontanous Breathing and Patient connected to nasal cannula oxygen  Post-op Assessment: Report given to RN and Post -op Vital signs reviewed and stable  Post vital signs: Reviewed and stable  Last Vitals:  Vitals:   07/12/16 0554  BP: 137/62  Pulse: (!) 56  Resp: 20  Temp: 36.6 C    Last Pain: There were no vitals filed for this visit.    Patients Stated Pain Goal: 7 (52/71/29 2909)  Complications: No apparent anesthesia complications

## 2016-07-12 NOTE — Anesthesia Postprocedure Evaluation (Signed)
Anesthesia Post Note  Patient: GRAYLING SCHRANZ  Procedure(s) Performed: Procedure(s) (LRB): LAPAROSCOPIC CHOLECYSTECTOMY (N/A) UMBILICAL HERINA REPAIR (N/A)  Patient location during evaluation: PACU Anesthesia Type: General Level of consciousness: awake and alert, oriented and patient cooperative Pain management: pain level controlled Vital Signs Assessment: post-procedure vital signs reviewed and stable Respiratory status: spontaneous breathing, nonlabored ventilation and respiratory function stable Cardiovascular status: blood pressure returned to baseline and stable Postop Assessment: no signs of nausea or vomiting Anesthetic complications: no    Last Vitals:  Vitals:   07/12/16 0941 07/12/16 0954  BP: (!) 131/51 (!) 157/70  Pulse: (!) 57 64  Resp: 12 16  Temp: 36.5 C     Last Pain:  Vitals:   07/12/16 0954  PainSc: 3                  Lanai Conlee,E. Donyea Gafford

## 2016-07-13 ENCOUNTER — Encounter (HOSPITAL_COMMUNITY): Payer: Self-pay | Admitting: Surgery

## 2016-08-30 DIAGNOSIS — E119 Type 2 diabetes mellitus without complications: Secondary | ICD-10-CM | POA: Diagnosis not present

## 2016-08-30 DIAGNOSIS — Z7984 Long term (current) use of oral hypoglycemic drugs: Secondary | ICD-10-CM | POA: Diagnosis not present

## 2016-08-30 DIAGNOSIS — J209 Acute bronchitis, unspecified: Secondary | ICD-10-CM | POA: Diagnosis not present

## 2016-09-29 ENCOUNTER — Encounter (HOSPITAL_COMMUNITY): Payer: Self-pay | Admitting: *Deleted

## 2016-10-13 DIAGNOSIS — H2513 Age-related nuclear cataract, bilateral: Secondary | ICD-10-CM | POA: Diagnosis not present

## 2016-10-13 DIAGNOSIS — H524 Presbyopia: Secondary | ICD-10-CM | POA: Diagnosis not present

## 2016-10-13 DIAGNOSIS — H52203 Unspecified astigmatism, bilateral: Secondary | ICD-10-CM | POA: Diagnosis not present

## 2016-10-13 DIAGNOSIS — H5203 Hypermetropia, bilateral: Secondary | ICD-10-CM | POA: Diagnosis not present

## 2016-10-13 DIAGNOSIS — H25013 Cortical age-related cataract, bilateral: Secondary | ICD-10-CM | POA: Diagnosis not present

## 2017-01-14 DIAGNOSIS — E785 Hyperlipidemia, unspecified: Secondary | ICD-10-CM | POA: Diagnosis not present

## 2017-01-14 DIAGNOSIS — E119 Type 2 diabetes mellitus without complications: Secondary | ICD-10-CM | POA: Diagnosis not present

## 2017-01-14 DIAGNOSIS — I878 Other specified disorders of veins: Secondary | ICD-10-CM | POA: Diagnosis present

## 2017-01-14 DIAGNOSIS — N179 Acute kidney failure, unspecified: Secondary | ICD-10-CM | POA: Diagnosis not present

## 2017-01-14 DIAGNOSIS — E11620 Type 2 diabetes mellitus with diabetic dermatitis: Secondary | ICD-10-CM | POA: Diagnosis not present

## 2017-01-14 DIAGNOSIS — L539 Erythematous condition, unspecified: Secondary | ICD-10-CM | POA: Diagnosis not present

## 2017-01-14 DIAGNOSIS — Z9049 Acquired absence of other specified parts of digestive tract: Secondary | ICD-10-CM | POA: Diagnosis not present

## 2017-01-14 DIAGNOSIS — I10 Essential (primary) hypertension: Secondary | ICD-10-CM | POA: Diagnosis not present

## 2017-01-14 DIAGNOSIS — I82402 Acute embolism and thrombosis of unspecified deep veins of left lower extremity: Secondary | ICD-10-CM | POA: Diagnosis not present

## 2017-01-14 DIAGNOSIS — C9591 Leukemia, unspecified, in remission: Secondary | ICD-10-CM | POA: Diagnosis not present

## 2017-01-14 DIAGNOSIS — M79662 Pain in left lower leg: Secondary | ICD-10-CM | POA: Diagnosis not present

## 2017-01-14 DIAGNOSIS — Z7984 Long term (current) use of oral hypoglycemic drugs: Secondary | ICD-10-CM | POA: Diagnosis not present

## 2017-01-14 DIAGNOSIS — E669 Obesity, unspecified: Secondary | ICD-10-CM | POA: Diagnosis present

## 2017-01-14 DIAGNOSIS — L03119 Cellulitis of unspecified part of limb: Secondary | ICD-10-CM | POA: Diagnosis not present

## 2017-01-14 DIAGNOSIS — L03116 Cellulitis of left lower limb: Secondary | ICD-10-CM | POA: Diagnosis not present

## 2017-01-14 DIAGNOSIS — M7989 Other specified soft tissue disorders: Secondary | ICD-10-CM | POA: Diagnosis not present

## 2017-01-14 DIAGNOSIS — M79605 Pain in left leg: Secondary | ICD-10-CM | POA: Diagnosis not present

## 2017-01-14 DIAGNOSIS — Z87891 Personal history of nicotine dependence: Secondary | ICD-10-CM | POA: Diagnosis not present

## 2017-01-14 DIAGNOSIS — Z6837 Body mass index (BMI) 37.0-37.9, adult: Secondary | ICD-10-CM | POA: Diagnosis not present

## 2017-01-14 DIAGNOSIS — R609 Edema, unspecified: Secondary | ICD-10-CM | POA: Diagnosis not present

## 2017-01-18 DIAGNOSIS — I872 Venous insufficiency (chronic) (peripheral): Secondary | ICD-10-CM | POA: Diagnosis not present

## 2017-01-18 DIAGNOSIS — L03116 Cellulitis of left lower limb: Secondary | ICD-10-CM | POA: Diagnosis not present

## 2017-01-25 DIAGNOSIS — L03116 Cellulitis of left lower limb: Secondary | ICD-10-CM | POA: Diagnosis not present

## 2017-01-25 DIAGNOSIS — B351 Tinea unguium: Secondary | ICD-10-CM | POA: Diagnosis not present

## 2017-01-25 DIAGNOSIS — R609 Edema, unspecified: Secondary | ICD-10-CM | POA: Diagnosis not present

## 2017-01-31 ENCOUNTER — Encounter: Payer: Self-pay | Admitting: Podiatry

## 2017-01-31 ENCOUNTER — Ambulatory Visit (INDEPENDENT_AMBULATORY_CARE_PROVIDER_SITE_OTHER): Payer: Medicare Other | Admitting: Podiatry

## 2017-01-31 DIAGNOSIS — E119 Type 2 diabetes mellitus without complications: Secondary | ICD-10-CM | POA: Diagnosis not present

## 2017-01-31 DIAGNOSIS — M79676 Pain in unspecified toe(s): Secondary | ICD-10-CM

## 2017-01-31 DIAGNOSIS — B351 Tinea unguium: Secondary | ICD-10-CM

## 2017-01-31 NOTE — Progress Notes (Signed)
   Subjective:    Patient ID: Derek [name], male    DOB: 1940/11/27, 76 y.o.   MRN: [id number]  HPI this patient presents the office with chief complaint of long thick painful big toenail, left foot. He says that nail is thick and painful walking and wearing his shoes.  This patient is a type II diabetic and was referred to the office by Dr. Deforest Hoyles for treatment.  He gives a history of having a injury requiring a muscular flap noted on the dorsum of his right foot. He also says that he developed cellulitis of his left lower leg, which is been treated with antibiotics and is improving.  He presents the office for diabetic evaluation and treatment of the long painful nails on both big toes    Review of Systems  Respiratory: Positive for shortness of breath.   Cardiovascular: Positive for leg swelling.  All other systems reviewed and are negative.      Objective:   Physical Exam GENERAL APPEARANCE: Alert, conversant. Appropriately groomed. No acute distress.  VASCULAR: Pedal pulses are  palpable at  [facility] and PT bilateral.  Capillary refill time is immediate to all digits,  Normal temperature gradient.   NEUROLOGIC: sensation is normal to 5.07 monofilament at 5/5 sites bilateral.  Light touch is intact bilateral, Muscle strength normal.  MUSCULOSKELETAL: acceptable muscle strength, tone and stability bilateral.  Intrinsic muscluature intact bilateral.  Rectus appearance of foot and digits noted bilateral.  Enlarged muscle flap right dorsum right foot.  DERMATOLOGIC: skin color, texture, and turgor are within normal limits.  No preulcerative lesions or ulcers  are seen, no interdigital maceration noted.  No open lesions present.  . No drainage noted. NAILS  Thick disfigured discolored nails with subungual debris. Hallux nails bilateral         Assessment & Plan:  Onychomycosis  Hallux     IE  Debridement of hallux nails  B/l   Gardiner Barefoot DPM

## 2017-02-07 DIAGNOSIS — Z6839 Body mass index (BMI) 39.0-39.9, adult: Secondary | ICD-10-CM | POA: Diagnosis not present

## 2017-02-07 DIAGNOSIS — L03119 Cellulitis of unspecified part of limb: Secondary | ICD-10-CM | POA: Diagnosis not present

## 2017-02-07 DIAGNOSIS — E1122 Type 2 diabetes mellitus with diabetic chronic kidney disease: Secondary | ICD-10-CM | POA: Diagnosis not present

## 2017-02-07 DIAGNOSIS — I872 Venous insufficiency (chronic) (peripheral): Secondary | ICD-10-CM | POA: Diagnosis not present

## 2017-02-07 DIAGNOSIS — E669 Obesity, unspecified: Secondary | ICD-10-CM | POA: Diagnosis not present

## 2017-02-07 DIAGNOSIS — E785 Hyperlipidemia, unspecified: Secondary | ICD-10-CM | POA: Diagnosis not present

## 2017-02-07 DIAGNOSIS — N182 Chronic kidney disease, stage 2 (mild): Secondary | ICD-10-CM | POA: Diagnosis not present

## 2017-02-07 DIAGNOSIS — I2510 Atherosclerotic heart disease of native coronary artery without angina pectoris: Secondary | ICD-10-CM | POA: Diagnosis not present

## 2017-02-07 DIAGNOSIS — C9110 Chronic lymphocytic leukemia of B-cell type not having achieved remission: Secondary | ICD-10-CM | POA: Diagnosis not present

## 2017-02-07 DIAGNOSIS — N529 Male erectile dysfunction, unspecified: Secondary | ICD-10-CM | POA: Diagnosis not present

## 2017-02-07 DIAGNOSIS — Z125 Encounter for screening for malignant neoplasm of prostate: Secondary | ICD-10-CM | POA: Diagnosis not present

## 2017-02-07 DIAGNOSIS — I10 Essential (primary) hypertension: Secondary | ICD-10-CM | POA: Diagnosis not present

## 2017-02-07 DIAGNOSIS — Z7984 Long term (current) use of oral hypoglycemic drugs: Secondary | ICD-10-CM | POA: Diagnosis not present

## 2017-03-06 ENCOUNTER — Ambulatory Visit (HOSPITAL_BASED_OUTPATIENT_CLINIC_OR_DEPARTMENT_OTHER): Payer: Medicare Other | Admitting: Hematology and Oncology

## 2017-03-06 ENCOUNTER — Other Ambulatory Visit (HOSPITAL_BASED_OUTPATIENT_CLINIC_OR_DEPARTMENT_OTHER): Payer: Medicare Other

## 2017-03-06 ENCOUNTER — Encounter: Payer: Self-pay | Admitting: Hematology and Oncology

## 2017-03-06 ENCOUNTER — Telehealth: Payer: Self-pay | Admitting: Hematology and Oncology

## 2017-03-06 DIAGNOSIS — Z8579 Personal history of other malignant neoplasms of lymphoid, hematopoietic and related tissues: Secondary | ICD-10-CM | POA: Diagnosis not present

## 2017-03-06 DIAGNOSIS — I872 Venous insufficiency (chronic) (peripheral): Secondary | ICD-10-CM | POA: Diagnosis not present

## 2017-03-06 DIAGNOSIS — C9110 Chronic lymphocytic leukemia of B-cell type not having achieved remission: Secondary | ICD-10-CM

## 2017-03-06 DIAGNOSIS — F1010 Alcohol abuse, uncomplicated: Secondary | ICD-10-CM | POA: Diagnosis not present

## 2017-03-06 DIAGNOSIS — R7989 Other specified abnormal findings of blood chemistry: Secondary | ICD-10-CM | POA: Diagnosis not present

## 2017-03-06 LAB — CBC WITH DIFFERENTIAL/PLATELET
BASO%: 0.7 % (ref 0.0–2.0)
Basophils Absolute: 0.1 10*3/uL (ref 0.0–0.1)
EOS%: 1.2 % (ref 0.0–7.0)
Eosinophils Absolute: 0.1 10*3/uL (ref 0.0–0.5)
HCT: 39.4 % (ref 38.4–49.9)
HGB: 13.5 g/dL (ref 13.0–17.1)
LYMPH%: 19 % (ref 14.0–49.0)
MCH: 30.5 pg (ref 27.2–33.4)
MCHC: 34.1 g/dL (ref 32.0–36.0)
MCV: 89.3 fL (ref 79.3–98.0)
MONO#: 0.6 10*3/uL (ref 0.1–0.9)
MONO%: 6.7 % (ref 0.0–14.0)
NEUT#: 7 10*3/uL — ABNORMAL HIGH (ref 1.5–6.5)
NEUT%: 72.4 % (ref 39.0–75.0)
Platelets: 158 10*3/uL (ref 140–400)
RBC: 4.42 10*6/uL (ref 4.20–5.82)
RDW: 15.1 % — ABNORMAL HIGH (ref 11.0–14.6)
WBC: 9.6 10*3/uL (ref 4.0–10.3)
lymph#: 1.8 10*3/uL (ref 0.9–3.3)

## 2017-03-06 NOTE — Telephone Encounter (Signed)
Appointments scheduled per 03/06/17 los. Patient was given a  Copy of the AVS report and appointment schedule, per 03/06/17 los.

## 2017-03-07 DIAGNOSIS — I872 Venous insufficiency (chronic) (peripheral): Secondary | ICD-10-CM | POA: Insufficient documentation

## 2017-03-07 NOTE — Assessment & Plan Note (Signed)
The patient is morbidly obese with cardiovascular risk factors. He drinks alcohol on a regular basis. I reinforced the importance of increasing physical activity and refraining from drinking excessive amount of alcohol

## 2017-03-07 NOTE — Progress Notes (Signed)
[place] OFFICE PROGRESS NOTE  Patient Care Team: Wenda Low, MD as PCP - General (Internal Medicine) Jerline Pain, MD as Attending Physician (Cardiology)  SUMMARY OF ONCOLOGIC HISTORY:   CLL (chronic lymphocytic leukemia) ([place])   09/10/2013 Initial Diagnosis    CLL (chronic lymphocytic leukemia)      12/26/2013 Pathology Results    FISH analysis was normal.      06/17/2014 Procedure    The patient has placement of Infuse-a-Port.      06/18/2014 Imaging    CT scan of the chest, abdomen and pelvis showed diffuse lymphadenopathy and splenomegaly.      06/19/2014 Bone Marrow Biopsy    Bone marrow aspirate and biopsy show CLL.      06/24/2014 - 11/18/2014 Chemotherapy    He received 6 cycles of Obinutuzumab and chlorambucil      09/15/2014 Imaging    Repeat CT scan of the chest, abdomen and pelvis show greater than 50% reduction in lymphadenopathy and splenomegaly      12/19/2014 Imaging    Repeat CT scan showed complete resolution of lymphadenopathy and splenomegaly.       INTERVAL HISTORY: Please see below for problem oriented charting. He returns for further follow-up Several months ago, he was treated for cellulitis affecting the left lower extremity He denies recent infection Denies new lymphadenopathy Denies recent cough, chest pain or shortness of breath He continues to drink alcohol on a regular basis  REVIEW OF SYSTEMS:   Constitutional: Denies fevers, chills or abnormal weight loss Eyes: Denies blurriness of vision Ears, nose, mouth, throat, and face: Denies mucositis or sore throat Respiratory: Denies cough, dyspnea or wheezes Cardiovascular: Denies palpitation, chest discomfort or lower extremity swelling Gastrointestinal:  Denies nausea, heartburn or change in bowel habits Skin: Denies abnormal skin rashes Lymphatics: Denies new lymphadenopathy or easy bruising Neurological:Denies numbness, tingling or new  weaknesses Behavioral/Psych: Mood is stable, no new changes  All other systems were reviewed with the patient and are negative.  I have reviewed the past medical history, past surgical history, social history and family history with the patient and they are unchanged from previous note.  ALLERGIES:  has No Known Allergies.  MEDICATIONS:  Current Outpatient Prescriptions  Medication Sig Dispense Refill  . aspirin 81 MG tablet Take 81 mg by mouth every other day.     Marland Kitchen atorvastatin (LIPITOR) 40 MG tablet Take 40 mg by mouth daily.    . Cholecalciferol (VITAMIN D PO) Take 1 tablet by mouth daily.    . fish oil-omega-3 fatty acids 1000 MG capsule Take 2 g by mouth daily.     Marland Kitchen lisinopril-hydrochlorothiazide (PRINZIDE,ZESTORETIC) 10-12.5 MG per tablet Take 1 tablet by mouth every morning.     . metFORMIN (GLUCOPHAGE) 1000 MG tablet Take 1,000 mg by mouth 2 (two) times daily with a meal.    . metoprolol succinate (TOPROL-XL) 25 MG 24 hr tablet Take 25 mg by mouth every evening.      No current facility-administered medications for this visit.     PHYSICAL EXAMINATION: ECOG PERFORMANCE STATUS: 1 - Symptomatic but completely ambulatory  Vitals:   03/06/17 1102  BP: (!) 142/57  Pulse: (!) 55  Resp: 20  Temp: 98.6 F (37 C)   Filed Weights   03/06/17 1102  Weight: (!) 309 lb 3.2 oz (140.3 kg)    GENERAL:alert, no distress and comfortable.  He is morbidly obese SKIN: skin color, texture, turgor are normal, no rashes or significant lesions.  Noted chronic venous stasis changes on both anterior shin area EYES: normal, Conjunctiva are pink and non-injected, sclera clear OROPHARYNX:no exudate, no erythema and lips, buccal mucosa, and tongue normal  NECK: supple, thyroid normal size, non-tender, without nodularity LYMPH:  no palpable lymphadenopathy in the cervical, axillary or inguinal LUNGS: clear to auscultation and percussion with normal breathing effort HEART: regular rate & rhythm  and no murmurs with moderate bilateral lower extremity edema ABDOMEN:abdomen soft, non-tender and normal bowel sounds Musculoskeletal:no cyanosis of digits and no clubbing  NEURO: alert & oriented x 3 with fluent speech, no focal motor/sensory deficits  LABORATORY DATA:  I have reviewed the data as listed    Component Value Date/Time   NA 139 07/11/2016 0846   NA 140 06/25/2015 0758   K 4.4 07/11/2016 0846   K 3.9 06/25/2015 0758   CL 105 07/11/2016 0846   CL 101 03/11/2013 1031   CO2 26 07/11/2016 0846   CO2 25 06/25/2015 0758   GLUCOSE 160 (H) 07/11/2016 0846   GLUCOSE 140 06/25/2015 0758   GLUCOSE 173 (H) 03/11/2013 1031   BUN 13 07/11/2016 0846   BUN 15.9 06/25/2015 0758   CREATININE 1.12 07/11/2016 0846   CREATININE 1.0 06/25/2015 0758   CALCIUM 9.4 07/11/2016 0846   CALCIUM 9.3 06/25/2015 0758   PROT 6.7 06/25/2015 0758   ALBUMIN 3.8 06/25/2015 0758   AST 15 06/25/2015 0758   ALT 14 06/25/2015 0758   ALKPHOS 61 06/25/2015 0758   BILITOT 0.95 06/25/2015 0758   GFRNONAA >60 07/11/2016 0846   GFRAA >60 07/11/2016 0846    No results found for: SPEP, UPEP  Lab Results  Component Value Date   WBC 9.6 03/06/2017   NEUTROABS 7.0 (H) 03/06/2017   HGB 13.5 03/06/2017   HCT 39.4 03/06/2017   MCV 89.3 03/06/2017   PLT 158 03/06/2017      Chemistry      Component Value Date/Time   NA 139 07/11/2016 0846   NA 140 06/25/2015 0758   K 4.4 07/11/2016 0846   K 3.9 06/25/2015 0758   CL 105 07/11/2016 0846   CL 101 03/11/2013 1031   CO2 26 07/11/2016 0846   CO2 25 06/25/2015 0758   BUN 13 07/11/2016 0846   BUN 15.9 06/25/2015 0758   CREATININE 1.12 07/11/2016 0846   CREATININE 1.0 06/25/2015 0758      Component Value Date/Time   CALCIUM 9.4 07/11/2016 0846   CALCIUM 9.3 06/25/2015 0758   ALKPHOS 61 06/25/2015 0758   AST 15 06/25/2015 0758   ALT 14 06/25/2015 0758   BILITOT 0.95 06/25/2015 0758      ASSESSMENT & PLAN:  CLL (chronic lymphocytic leukemia) He  had completed all treatment and have complete response. Examination today showed no evidence of recurrence. I plan to see him back in 12 months with repeat history, physical examination and blood work.  Chronic venous stasis dermatitis He has chronic bilateral anterior shin skin changes due to chronic venous stasis dermatitis We will continue to monitor carefully Clinically, he has no signs of infection  Morbid obesity (HCC) The patient is morbidly obese with cardiovascular risk factors. He drinks alcohol on a regular basis. I reinforced the importance of increasing physical activity and refraining from drinking excessive amount of alcohol   No orders of the defined types were placed in this encounter.  All questions were answered. The patient knows to call the clinic with any problems, questions or concerns. No barriers to learning was   detected. I spent 15 minutes counseling the patient face to face. The total time spent in the appointment was 20 minutes and more than 50% was on counseling and review of test results      , MD 03/07/2017 5:39 PM  

## 2017-03-07 NOTE — Assessment & Plan Note (Signed)
He had completed all treatment and have complete response. Examination today showed no evidence of recurrence. I plan to see him back in 12 months with repeat history, physical examination and blood work.

## 2017-03-07 NOTE — Assessment & Plan Note (Signed)
He has chronic bilateral anterior shin skin changes due to chronic venous stasis dermatitis We will continue to monitor carefully Clinically, he has no signs of infection

## 2017-04-07 DIAGNOSIS — Z1389 Encounter for screening for other disorder: Secondary | ICD-10-CM | POA: Diagnosis not present

## 2017-04-07 DIAGNOSIS — Z0000 Encounter for general adult medical examination without abnormal findings: Secondary | ICD-10-CM | POA: Diagnosis not present

## 2017-05-11 DIAGNOSIS — E785 Hyperlipidemia, unspecified: Secondary | ICD-10-CM | POA: Diagnosis not present

## 2017-05-11 DIAGNOSIS — N182 Chronic kidney disease, stage 2 (mild): Secondary | ICD-10-CM | POA: Diagnosis not present

## 2017-05-11 DIAGNOSIS — E1122 Type 2 diabetes mellitus with diabetic chronic kidney disease: Secondary | ICD-10-CM | POA: Diagnosis not present

## 2017-05-11 DIAGNOSIS — I2510 Atherosclerotic heart disease of native coronary artery without angina pectoris: Secondary | ICD-10-CM | POA: Diagnosis not present

## 2017-05-11 DIAGNOSIS — E119 Type 2 diabetes mellitus without complications: Secondary | ICD-10-CM | POA: Diagnosis not present

## 2017-05-11 DIAGNOSIS — I10 Essential (primary) hypertension: Secondary | ICD-10-CM | POA: Diagnosis not present

## 2017-06-07 ENCOUNTER — Ambulatory Visit: Payer: Medicare Other | Admitting: Podiatry

## 2017-06-12 DIAGNOSIS — E1165 Type 2 diabetes mellitus with hyperglycemia: Secondary | ICD-10-CM | POA: Diagnosis not present

## 2017-06-12 DIAGNOSIS — Z7984 Long term (current) use of oral hypoglycemic drugs: Secondary | ICD-10-CM | POA: Diagnosis not present

## 2017-06-12 DIAGNOSIS — N183 Chronic kidney disease, stage 3 (moderate): Secondary | ICD-10-CM | POA: Diagnosis not present

## 2017-06-12 DIAGNOSIS — I872 Venous insufficiency (chronic) (peripheral): Secondary | ICD-10-CM | POA: Diagnosis not present

## 2017-06-12 DIAGNOSIS — I2510 Atherosclerotic heart disease of native coronary artery without angina pectoris: Secondary | ICD-10-CM | POA: Diagnosis not present

## 2017-06-12 DIAGNOSIS — E349 Endocrine disorder, unspecified: Secondary | ICD-10-CM | POA: Diagnosis not present

## 2017-06-12 DIAGNOSIS — E1122 Type 2 diabetes mellitus with diabetic chronic kidney disease: Secondary | ICD-10-CM | POA: Diagnosis not present

## 2017-06-12 DIAGNOSIS — Z23 Encounter for immunization: Secondary | ICD-10-CM | POA: Diagnosis not present

## 2017-06-12 DIAGNOSIS — C9110 Chronic lymphocytic leukemia of B-cell type not having achieved remission: Secondary | ICD-10-CM | POA: Diagnosis not present

## 2017-06-12 DIAGNOSIS — I10 Essential (primary) hypertension: Secondary | ICD-10-CM | POA: Diagnosis not present

## 2017-06-12 DIAGNOSIS — E785 Hyperlipidemia, unspecified: Secondary | ICD-10-CM | POA: Diagnosis not present

## 2017-06-13 DIAGNOSIS — E291 Testicular hypofunction: Secondary | ICD-10-CM | POA: Diagnosis not present

## 2017-06-22 DIAGNOSIS — H2513 Age-related nuclear cataract, bilateral: Secondary | ICD-10-CM | POA: Diagnosis not present

## 2017-06-22 DIAGNOSIS — H25013 Cortical age-related cataract, bilateral: Secondary | ICD-10-CM | POA: Diagnosis not present

## 2017-06-22 DIAGNOSIS — E119 Type 2 diabetes mellitus without complications: Secondary | ICD-10-CM | POA: Diagnosis not present

## 2017-07-07 ENCOUNTER — Ambulatory Visit: Payer: Medicare Other | Admitting: Podiatry

## 2017-07-07 DIAGNOSIS — E349 Endocrine disorder, unspecified: Secondary | ICD-10-CM | POA: Diagnosis not present

## 2017-08-02 ENCOUNTER — Ambulatory Visit: Payer: Medicare Other | Admitting: Podiatry

## 2017-08-18 ENCOUNTER — Ambulatory Visit: Payer: Medicare Other | Admitting: Podiatry

## 2017-08-24 DIAGNOSIS — E291 Testicular hypofunction: Secondary | ICD-10-CM | POA: Diagnosis not present

## 2017-09-05 ENCOUNTER — Ambulatory Visit (INDEPENDENT_AMBULATORY_CARE_PROVIDER_SITE_OTHER): Payer: Medicare Other | Admitting: Podiatry

## 2017-09-05 ENCOUNTER — Encounter: Payer: Self-pay | Admitting: Podiatry

## 2017-09-05 DIAGNOSIS — M79676 Pain in unspecified toe(s): Secondary | ICD-10-CM

## 2017-09-05 DIAGNOSIS — E119 Type 2 diabetes mellitus without complications: Secondary | ICD-10-CM

## 2017-09-05 DIAGNOSIS — B351 Tinea unguium: Secondary | ICD-10-CM

## 2017-09-05 NOTE — Progress Notes (Signed)
His patient prehe office for continued evaluation and treatment of his thick painful toenails on both big toes.  He says he is able to work on all his other toes, but these big toes are too thick for him to work himself.  This patient is a diabetic with no foot complications.  Marland Kitchen He presents the office today for preventative foot care services  . It has been 7 months since his last appointment.  General Appearance  Alert, conversant and in no acute stress.  Vascular  Dorsalis pedis and posterior pulses are palpable  bilaterally.  Capillary return is within normal limits  Bilaterally. Temperature is within normal limits  Bilaterally  Neurologic  Senn-Weinstein monofilament wire test within normal limits  bilaterally. Muscle power  Within normal limits bilaterally.  Nails Thick disfigured discolored nails with subungual debris bilaterally on  hallux  bilaterally. No evidence of bacterial infection or drainage bilaterally.  Orthopedic  No limitations of motion of motion feet bilaterally.  No crepitus or effusions noted.  No bony pathology or digital deformities noted. Enlarged muscle flap noted on the dorsum of the right foot  Skin  normotropic skin with no porokeratosis noted bilaterally.  No signs of infections or ulcers noted.   Onychomycosis  Hallux  B/L   ROV.  Debridement of hallux nails  B/L.   RTC 4 months.   Gardiner Barefoot DPM

## 2017-09-07 DIAGNOSIS — R7989 Other specified abnormal findings of blood chemistry: Secondary | ICD-10-CM | POA: Diagnosis not present

## 2017-09-20 DIAGNOSIS — E349 Endocrine disorder, unspecified: Secondary | ICD-10-CM | POA: Diagnosis not present

## 2017-10-19 DIAGNOSIS — E349 Endocrine disorder, unspecified: Secondary | ICD-10-CM | POA: Diagnosis not present

## 2017-10-19 DIAGNOSIS — N182 Chronic kidney disease, stage 2 (mild): Secondary | ICD-10-CM | POA: Diagnosis not present

## 2017-10-19 DIAGNOSIS — R5383 Other fatigue: Secondary | ICD-10-CM | POA: Diagnosis not present

## 2017-10-19 DIAGNOSIS — I10 Essential (primary) hypertension: Secondary | ICD-10-CM | POA: Diagnosis not present

## 2017-10-19 DIAGNOSIS — E1122 Type 2 diabetes mellitus with diabetic chronic kidney disease: Secondary | ICD-10-CM | POA: Diagnosis not present

## 2017-10-19 DIAGNOSIS — I2510 Atherosclerotic heart disease of native coronary artery without angina pectoris: Secondary | ICD-10-CM | POA: Diagnosis not present

## 2017-10-19 DIAGNOSIS — C9110 Chronic lymphocytic leukemia of B-cell type not having achieved remission: Secondary | ICD-10-CM | POA: Diagnosis not present

## 2017-10-19 DIAGNOSIS — R202 Paresthesia of skin: Secondary | ICD-10-CM | POA: Diagnosis not present

## 2017-10-23 DIAGNOSIS — R7989 Other specified abnormal findings of blood chemistry: Secondary | ICD-10-CM | POA: Diagnosis not present

## 2017-10-25 DIAGNOSIS — L814 Other melanin hyperpigmentation: Secondary | ICD-10-CM | POA: Diagnosis not present

## 2017-10-25 DIAGNOSIS — D225 Melanocytic nevi of trunk: Secondary | ICD-10-CM | POA: Diagnosis not present

## 2017-10-25 DIAGNOSIS — L309 Dermatitis, unspecified: Secondary | ICD-10-CM | POA: Diagnosis not present

## 2017-10-25 DIAGNOSIS — D485 Neoplasm of uncertain behavior of skin: Secondary | ICD-10-CM | POA: Diagnosis not present

## 2017-10-25 DIAGNOSIS — L570 Actinic keratosis: Secondary | ICD-10-CM | POA: Diagnosis not present

## 2018-01-02 ENCOUNTER — Ambulatory Visit: Payer: Medicare Other | Admitting: Podiatry

## 2018-01-23 DIAGNOSIS — Z872 Personal history of diseases of the skin and subcutaneous tissue: Secondary | ICD-10-CM | POA: Diagnosis not present

## 2018-01-23 DIAGNOSIS — L570 Actinic keratosis: Secondary | ICD-10-CM | POA: Diagnosis not present

## 2018-01-23 DIAGNOSIS — B078 Other viral warts: Secondary | ICD-10-CM | POA: Diagnosis not present

## 2018-01-23 DIAGNOSIS — D225 Melanocytic nevi of trunk: Secondary | ICD-10-CM | POA: Diagnosis not present

## 2018-01-31 ENCOUNTER — Ambulatory Visit: Payer: Medicare Other | Admitting: Podiatry

## 2018-02-28 DIAGNOSIS — N410 Acute prostatitis: Secondary | ICD-10-CM | POA: Diagnosis not present

## 2018-02-28 DIAGNOSIS — N5201 Erectile dysfunction due to arterial insufficiency: Secondary | ICD-10-CM | POA: Diagnosis not present

## 2018-03-06 ENCOUNTER — Other Ambulatory Visit: Payer: Self-pay | Admitting: Hematology and Oncology

## 2018-03-06 ENCOUNTER — Inpatient Hospital Stay: Payer: Medicare Other | Attending: Hematology and Oncology | Admitting: Hematology and Oncology

## 2018-03-06 ENCOUNTER — Inpatient Hospital Stay: Payer: Medicare Other

## 2018-03-06 ENCOUNTER — Telehealth: Payer: Self-pay | Admitting: *Deleted

## 2018-03-06 ENCOUNTER — Other Ambulatory Visit: Payer: Self-pay | Admitting: *Deleted

## 2018-03-06 ENCOUNTER — Telehealth: Payer: Self-pay | Admitting: Hematology and Oncology

## 2018-03-06 ENCOUNTER — Encounter: Payer: Self-pay | Admitting: Hematology and Oncology

## 2018-03-06 DIAGNOSIS — Z79899 Other long term (current) drug therapy: Secondary | ICD-10-CM

## 2018-03-06 DIAGNOSIS — Z85828 Personal history of other malignant neoplasm of skin: Secondary | ICD-10-CM

## 2018-03-06 DIAGNOSIS — E119 Type 2 diabetes mellitus without complications: Secondary | ICD-10-CM

## 2018-03-06 DIAGNOSIS — R7989 Other specified abnormal findings of blood chemistry: Secondary | ICD-10-CM | POA: Insufficient documentation

## 2018-03-06 DIAGNOSIS — Z7982 Long term (current) use of aspirin: Secondary | ICD-10-CM

## 2018-03-06 DIAGNOSIS — C4441 Basal cell carcinoma of skin of scalp and neck: Secondary | ICD-10-CM | POA: Insufficient documentation

## 2018-03-06 DIAGNOSIS — Z8579 Personal history of other malignant neoplasms of lymphoid, hematopoietic and related tissues: Secondary | ICD-10-CM | POA: Diagnosis not present

## 2018-03-06 DIAGNOSIS — C9110 Chronic lymphocytic leukemia of B-cell type not having achieved remission: Secondary | ICD-10-CM

## 2018-03-06 DIAGNOSIS — Z7984 Long term (current) use of oral hypoglycemic drugs: Secondary | ICD-10-CM | POA: Diagnosis not present

## 2018-03-06 LAB — CMP (CANCER CENTER ONLY)
ALT: 17 U/L (ref 0–55)
AST: 12 U/L (ref 5–34)
Albumin: 3.8 g/dL (ref 3.5–5.0)
Alkaline Phosphatase: 74 U/L (ref 40–150)
Anion gap: 8 (ref 3–11)
BUN: 21 mg/dL (ref 7–26)
CO2: 25 mmol/L (ref 22–29)
Calcium: 9.7 mg/dL (ref 8.4–10.4)
Chloride: 104 mmol/L (ref 98–109)
Creatinine: 1.58 mg/dL — ABNORMAL HIGH (ref 0.70–1.30)
GFR, Est AFR Am: 48 mL/min — ABNORMAL LOW (ref >60)
GFR, Estimated: 41 mL/min — ABNORMAL LOW (ref >60)
Glucose, Bld: 261 mg/dL — ABNORMAL HIGH (ref 70–140)
Potassium: 4.8 mmol/L (ref 3.5–5.1)
Sodium: 137 mmol/L (ref 136–145)
Total Bilirubin: 0.8 mg/dL (ref 0.2–1.2)
Total Protein: 6.7 g/dL (ref 6.4–8.3)

## 2018-03-06 LAB — CBC WITH DIFFERENTIAL (CANCER CENTER ONLY)
Basophils Absolute: 0.1 10*3/uL (ref 0.0–0.1)
Basophils Relative: 1 %
Eosinophils Absolute: 0.1 10*3/uL (ref 0.0–0.5)
Eosinophils Relative: 1 %
HCT: 39.9 % (ref 38.4–49.9)
Hemoglobin: 13.7 g/dL (ref 13.0–17.1)
Lymphocytes Relative: 14 %
Lymphs Abs: 1 10*3/uL (ref 0.9–3.3)
MCH: 30.5 pg (ref 27.2–33.4)
MCHC: 34.3 g/dL (ref 32.0–36.0)
MCV: 89.1 fL (ref 79.3–98.0)
Monocytes Absolute: 0.6 10*3/uL (ref 0.1–0.9)
Monocytes Relative: 8 %
Neutro Abs: 5.4 10*3/uL (ref 1.5–6.5)
Neutrophils Relative %: 76 %
Platelet Count: 142 10*3/uL (ref 140–400)
RBC: 4.48 MIL/uL (ref 4.20–5.82)
RDW: 13.9 % (ref 11.0–14.6)
WBC Count: 7.2 10*3/uL (ref 4.0–10.3)

## 2018-03-06 LAB — LACTATE DEHYDROGENASE: LDH: 119 U/L — ABNORMAL LOW (ref 125–245)

## 2018-03-06 NOTE — Telephone Encounter (Signed)
Notified of message below. Verbalized understanding 

## 2018-03-06 NOTE — Progress Notes (Signed)
[facility] OFFICE PROGRESS NOTE  Patient Care Team: Husain, Karrar, MD as PCP - General (Internal Medicine) Skains, Mark C, MD as Attending Physician (Cardiology)  ASSESSMENT & PLAN:  CLL (chronic lymphocytic leukemia) He has no signs or symptoms of cancer recurrence I will see him back once a year for history, physical examination and blood work  Diabetes mellitus, type II (HCC) He has poorly controlled blood sugar I recommend close follow-up with his primary care doctor for further management  Elevated serum creatinine He has elevated serum creatinine likely precipitated by poor oral intake and hyperglycemia I recommend the patient to drink extra fluid  History of skin cancer He has history of skin cancer I recommend avoidance of excessive sun exposure and close follow-up with dermatologist   No orders of the defined types were placed in this encounter.   INTERVAL HISTORY: Please see below for problem oriented charting. He returns for further follow-up He feels well No recent lymphadenopathy He had recent diagnosis of bladder infection, successfully treated by urologist with antibiotic therapy He denies new diagnosis of skin cancer He is active with all activities of daily living  SUMMARY OF ONCOLOGIC HISTORY: Oncology History   Normal FISH     CLL (chronic lymphocytic leukemia) (HCC)   09/10/2013 Initial Diagnosis    CLL (chronic lymphocytic leukemia)      12/26/2013 Pathology Results    FISH analysis was normal.      06/17/2014 Procedure    The patient has placement of Infuse-a-Port.      06/18/2014 Imaging    CT scan of the chest, abdomen and pelvis showed diffuse lymphadenopathy and splenomegaly.      06/19/2014 Bone Marrow Biopsy    Bone marrow aspirate and biopsy show CLL.      06/24/2014 - 11/18/2014 Chemotherapy    He received 6 cycles of Obinutuzumab and chlorambucil      09/15/2014 Imaging    Repeat CT scan of the chest, abdomen  and pelvis show greater than 50% reduction in lymphadenopathy and splenomegaly      12/19/2014 Imaging    Repeat CT scan showed complete resolution of lymphadenopathy and splenomegaly.       REVIEW OF SYSTEMS:   Constitutional: Denies fevers, chills or abnormal weight loss Eyes: Denies blurriness of vision Ears, nose, mouth, throat, and face: Denies mucositis or sore throat Respiratory: Denies cough, dyspnea or wheezes Cardiovascular: Denies palpitation, chest discomfort or lower extremity swelling Gastrointestinal:  Denies nausea, heartburn or change in bowel habits Skin: Denies abnormal skin rashes Lymphatics: Denies new lymphadenopathy or easy bruising Neurological:Denies numbness, tingling or new weaknesses Behavioral/Psych: Mood is stable, no new changes  All other systems were reviewed with the patient and are negative.  I have reviewed the past medical history, past surgical history, social history and family history with the patient and they are unchanged from previous note.  ALLERGIES:  has No Known Allergies.  MEDICATIONS:  Current Outpatient Medications  Medication Sig Dispense Refill  . aspirin 81 MG tablet Take 81 mg by mouth every other day.     . atorvastatin (LIPITOR) 40 MG tablet Take 40 mg by mouth daily.    . Cholecalciferol (VITAMIN D PO) Take 1 tablet by mouth daily.    . fish oil-omega-3 fatty acids 1000 MG capsule Take 2 g by mouth daily.     . lisinopril-hydrochlorothiazide (PRINZIDE,ZESTORETIC) 10-12.5 MG per tablet Take 1 tablet by mouth every morning.     . metFORMIN (GLUCOPHAGE) 1000   MG tablet Take 1,000 mg by mouth 2 (two) times daily with a meal.    . metoprolol succinate (TOPROL-XL) 25 MG 24 hr tablet Take 25 mg by mouth every evening.      No current facility-administered medications for this visit.     PHYSICAL EXAMINATION: ECOG PERFORMANCE STATUS: 0 - Asymptomatic  Vitals:   03/06/18 0855  BP: (!) 129/55  Pulse: (!) 52  Resp: 18  Temp:  98.6 F (37 C)  SpO2: 98%   Filed Weights   03/06/18 0855  Weight: (!) 316 lb 11.2 oz (143.7 kg)    GENERAL:alert, no distress and comfortable SKIN: skin color, texture, turgor are normal, no rashes or significant lesions EYES: normal, Conjunctiva are pink and non-injected, sclera clear OROPHARYNX:no exudate, no erythema and lips, buccal mucosa, and tongue normal  NECK: supple, thyroid normal size, non-tender, without nodularity LYMPH:  no palpable lymphadenopathy in the cervical, axillary or inguinal LUNGS: clear to auscultation and percussion with normal breathing effort HEART: regular rate & rhythm and no murmurs and no lower extremity edema ABDOMEN:abdomen soft, non-tender and normal bowel sounds Musculoskeletal:no cyanosis of digits and no clubbing  NEURO: alert & oriented x 3 with fluent speech, no focal motor/sensory deficits  LABORATORY DATA:  I have reviewed the data as listed    Component Value Date/Time   NA 137 03/06/2018 0816   NA 140 06/25/2015 0758   K 4.8 03/06/2018 0816   K 3.9 06/25/2015 0758   CL 104 03/06/2018 0816   CL 101 03/11/2013 1031   CO2 25 03/06/2018 0816   CO2 25 06/25/2015 0758   GLUCOSE 261 (H) 03/06/2018 0816   GLUCOSE 140 06/25/2015 0758   GLUCOSE 173 (H) 03/11/2013 1031   BUN 21 03/06/2018 0816   BUN 15.9 06/25/2015 0758   CREATININE 1.58 (H) 03/06/2018 0816   CREATININE 1.0 06/25/2015 0758   CALCIUM 9.7 03/06/2018 0816   CALCIUM 9.3 06/25/2015 0758   PROT 6.7 03/06/2018 0816   PROT 6.7 06/25/2015 0758   ALBUMIN 3.8 03/06/2018 0816   ALBUMIN 3.8 06/25/2015 0758   AST 12 03/06/2018 0816   AST 15 06/25/2015 0758   ALT 17 03/06/2018 0816   ALT 14 06/25/2015 0758   ALKPHOS 74 03/06/2018 0816   ALKPHOS 61 06/25/2015 0758   BILITOT 0.8 03/06/2018 0816   BILITOT 0.95 06/25/2015 0758   GFRNONAA 41 (L) 03/06/2018 0816   GFRAA 48 (L) 03/06/2018 0816    No results found for: SPEP, UPEP  Lab Results  Component Value Date   WBC 7.2  03/06/2018   NEUTROABS 5.4 03/06/2018   HGB 13.7 03/06/2018   HCT 39.9 03/06/2018   MCV 89.1 03/06/2018   PLT 142 03/06/2018      Chemistry      Component Value Date/Time   NA 137 03/06/2018 0816   NA 140 06/25/2015 0758   K 4.8 03/06/2018 0816   K 3.9 06/25/2015 0758   CL 104 03/06/2018 0816   CL 101 03/11/2013 1031   CO2 25 03/06/2018 0816   CO2 25 06/25/2015 0758   BUN 21 03/06/2018 0816   BUN 15.9 06/25/2015 0758   CREATININE 1.58 (H) 03/06/2018 0816   CREATININE 1.0 06/25/2015 0758      Component Value Date/Time   CALCIUM 9.7 03/06/2018 0816   CALCIUM 9.3 06/25/2015 0758   ALKPHOS 74 03/06/2018 0816   ALKPHOS 61 06/25/2015 0758   AST 12 03/06/2018 0816   AST 15 06/25/2015 0758     ALT 17 03/06/2018 0816   ALT 14 06/25/2015 0758   BILITOT 0.8 03/06/2018 0816   BILITOT 0.95 06/25/2015 0758      All questions were answered. The patient knows to call the clinic with any problems, questions or concerns. No barriers to learning was detected.  I spent 15 minutes counseling the patient face to face. The total time spent in the appointment was 20 minutes and more than 50% was on counseling and review of test results   , MD 03/06/2018 9:21 AM  

## 2018-03-06 NOTE — Assessment & Plan Note (Signed)
He has poorly controlled blood sugar I recommend close follow-up with his primary care doctor for further management

## 2018-03-06 NOTE — Assessment & Plan Note (Signed)
He has elevated serum creatinine likely precipitated by poor oral intake and hyperglycemia I recommend the patient to drink extra fluid

## 2018-03-06 NOTE — Telephone Encounter (Signed)
-----   Message from [name], MD sent at 03/06/2018  9:14 AM EDT ----- Regarding: labs Pls let him know blood sugar and creatinine is high I recommend extra fluids today and close follow-up with primary doctor for follow-up ----- Message ----- From: Interface, Lab In [place] Sent: 03/06/2018   8:44 AM To: [name], MD

## 2018-03-06 NOTE — Assessment & Plan Note (Signed)
He has no signs or symptoms of cancer recurrence I will see him back once a year for history, physical examination and blood work 

## 2018-03-06 NOTE — Telephone Encounter (Signed)
Gave avs and calendar ° °

## 2018-03-06 NOTE — Assessment & Plan Note (Signed)
He has history of skin cancer I recommend avoidance of excessive sun exposure and close follow-up with dermatologist

## 2018-03-12 DIAGNOSIS — N5201 Erectile dysfunction due to arterial insufficiency: Secondary | ICD-10-CM | POA: Diagnosis not present

## 2018-04-10 DIAGNOSIS — E1122 Type 2 diabetes mellitus with diabetic chronic kidney disease: Secondary | ICD-10-CM | POA: Diagnosis not present

## 2018-04-10 DIAGNOSIS — Z125 Encounter for screening for malignant neoplasm of prostate: Secondary | ICD-10-CM | POA: Diagnosis not present

## 2018-04-10 DIAGNOSIS — N183 Chronic kidney disease, stage 3 (moderate): Secondary | ICD-10-CM | POA: Diagnosis not present

## 2018-04-10 DIAGNOSIS — I872 Venous insufficiency (chronic) (peripheral): Secondary | ICD-10-CM | POA: Diagnosis not present

## 2018-04-10 DIAGNOSIS — I2510 Atherosclerotic heart disease of native coronary artery without angina pectoris: Secondary | ICD-10-CM | POA: Diagnosis not present

## 2018-04-10 DIAGNOSIS — Z0000 Encounter for general adult medical examination without abnormal findings: Secondary | ICD-10-CM | POA: Diagnosis not present

## 2018-04-10 DIAGNOSIS — R21 Rash and other nonspecific skin eruption: Secondary | ICD-10-CM | POA: Diagnosis not present

## 2018-04-10 DIAGNOSIS — E785 Hyperlipidemia, unspecified: Secondary | ICD-10-CM | POA: Diagnosis not present

## 2018-04-10 DIAGNOSIS — I10 Essential (primary) hypertension: Secondary | ICD-10-CM | POA: Diagnosis not present

## 2018-04-10 DIAGNOSIS — E349 Endocrine disorder, unspecified: Secondary | ICD-10-CM | POA: Diagnosis not present

## 2018-04-10 DIAGNOSIS — C9110 Chronic lymphocytic leukemia of B-cell type not having achieved remission: Secondary | ICD-10-CM | POA: Diagnosis not present

## 2018-04-10 DIAGNOSIS — Z1389 Encounter for screening for other disorder: Secondary | ICD-10-CM | POA: Diagnosis not present

## 2018-05-01 DIAGNOSIS — D485 Neoplasm of uncertain behavior of skin: Secondary | ICD-10-CM | POA: Diagnosis not present

## 2018-05-01 DIAGNOSIS — L308 Other specified dermatitis: Secondary | ICD-10-CM | POA: Diagnosis not present

## 2018-05-01 DIAGNOSIS — L259 Unspecified contact dermatitis, unspecified cause: Secondary | ICD-10-CM | POA: Diagnosis not present

## 2018-05-30 DIAGNOSIS — Z7984 Long term (current) use of oral hypoglycemic drugs: Secondary | ICD-10-CM | POA: Diagnosis not present

## 2018-05-30 DIAGNOSIS — E1122 Type 2 diabetes mellitus with diabetic chronic kidney disease: Secondary | ICD-10-CM | POA: Diagnosis not present

## 2018-06-13 DIAGNOSIS — E1122 Type 2 diabetes mellitus with diabetic chronic kidney disease: Secondary | ICD-10-CM | POA: Diagnosis not present

## 2018-06-13 DIAGNOSIS — E119 Type 2 diabetes mellitus without complications: Secondary | ICD-10-CM | POA: Diagnosis not present

## 2018-06-13 DIAGNOSIS — N183 Chronic kidney disease, stage 3 (moderate): Secondary | ICD-10-CM | POA: Diagnosis not present

## 2018-06-13 DIAGNOSIS — I2510 Atherosclerotic heart disease of native coronary artery without angina pectoris: Secondary | ICD-10-CM | POA: Diagnosis not present

## 2018-06-13 DIAGNOSIS — Z7984 Long term (current) use of oral hypoglycemic drugs: Secondary | ICD-10-CM | POA: Diagnosis not present

## 2018-06-13 DIAGNOSIS — E782 Mixed hyperlipidemia: Secondary | ICD-10-CM | POA: Diagnosis not present

## 2018-06-13 DIAGNOSIS — I10 Essential (primary) hypertension: Secondary | ICD-10-CM | POA: Diagnosis not present

## 2018-07-10 DIAGNOSIS — Z7984 Long term (current) use of oral hypoglycemic drugs: Secondary | ICD-10-CM | POA: Diagnosis not present

## 2018-07-10 DIAGNOSIS — E119 Type 2 diabetes mellitus without complications: Secondary | ICD-10-CM | POA: Diagnosis not present

## 2018-07-10 DIAGNOSIS — H25013 Cortical age-related cataract, bilateral: Secondary | ICD-10-CM | POA: Diagnosis not present

## 2018-07-10 DIAGNOSIS — H2513 Age-related nuclear cataract, bilateral: Secondary | ICD-10-CM | POA: Diagnosis not present

## 2018-07-26 DIAGNOSIS — D225 Melanocytic nevi of trunk: Secondary | ICD-10-CM | POA: Diagnosis not present

## 2018-07-26 DIAGNOSIS — D1801 Hemangioma of skin and subcutaneous tissue: Secondary | ICD-10-CM | POA: Diagnosis not present

## 2018-07-26 DIAGNOSIS — L309 Dermatitis, unspecified: Secondary | ICD-10-CM | POA: Diagnosis not present

## 2018-07-26 DIAGNOSIS — L570 Actinic keratosis: Secondary | ICD-10-CM | POA: Diagnosis not present

## 2018-07-26 DIAGNOSIS — L821 Other seborrheic keratosis: Secondary | ICD-10-CM | POA: Diagnosis not present

## 2018-07-26 DIAGNOSIS — L218 Other seborrheic dermatitis: Secondary | ICD-10-CM | POA: Diagnosis not present

## 2018-08-23 DIAGNOSIS — M25511 Pain in right shoulder: Secondary | ICD-10-CM | POA: Diagnosis not present

## 2018-09-03 DIAGNOSIS — R000 Tachycardia, unspecified: Secondary | ICD-10-CM | POA: Diagnosis not present

## 2018-09-03 DIAGNOSIS — I2510 Atherosclerotic heart disease of native coronary artery without angina pectoris: Secondary | ICD-10-CM | POA: Diagnosis not present

## 2018-09-03 DIAGNOSIS — Z7984 Long term (current) use of oral hypoglycemic drugs: Secondary | ICD-10-CM | POA: Diagnosis not present

## 2018-09-03 DIAGNOSIS — E349 Endocrine disorder, unspecified: Secondary | ICD-10-CM | POA: Diagnosis not present

## 2018-09-03 DIAGNOSIS — R5383 Other fatigue: Secondary | ICD-10-CM | POA: Diagnosis not present

## 2018-09-03 DIAGNOSIS — C9110 Chronic lymphocytic leukemia of B-cell type not having achieved remission: Secondary | ICD-10-CM | POA: Diagnosis not present

## 2018-09-03 DIAGNOSIS — E1122 Type 2 diabetes mellitus with diabetic chronic kidney disease: Secondary | ICD-10-CM | POA: Diagnosis not present

## 2018-09-04 ENCOUNTER — Telehealth: Payer: Self-pay

## 2018-09-04 ENCOUNTER — Encounter: Payer: Self-pay | Admitting: Cardiology

## 2018-09-04 ENCOUNTER — Ambulatory Visit (INDEPENDENT_AMBULATORY_CARE_PROVIDER_SITE_OTHER): Payer: Medicare Other | Admitting: Cardiology

## 2018-09-04 VITALS — BP 124/86 | HR 134 | Ht 76.0 in | Wt 305.8 lb

## 2018-09-04 DIAGNOSIS — I2510 Atherosclerotic heart disease of native coronary artery without angina pectoris: Secondary | ICD-10-CM

## 2018-09-04 DIAGNOSIS — I10 Essential (primary) hypertension: Secondary | ICD-10-CM

## 2018-09-04 DIAGNOSIS — E119 Type 2 diabetes mellitus without complications: Secondary | ICD-10-CM | POA: Diagnosis not present

## 2018-09-04 DIAGNOSIS — I4892 Unspecified atrial flutter: Secondary | ICD-10-CM | POA: Diagnosis not present

## 2018-09-04 LAB — CBC WITH DIFFERENTIAL/PLATELET
Basophils Absolute: 0 10*3/uL (ref 0.0–0.2)
Basos: 0 %
EOS (ABSOLUTE): 0.1 10*3/uL (ref 0.0–0.4)
Eos: 1 %
Hematocrit: 45.4 % (ref 37.5–51.0)
Hemoglobin: 15.3 g/dL (ref 13.0–17.7)
Lymphocytes Absolute: 1.4 10*3/uL (ref 0.7–3.1)
Lymphs: 12 %
MCH: 29.5 pg (ref 26.6–33.0)
MCHC: 33.7 g/dL (ref 31.5–35.7)
MCV: 88 fL (ref 79–97)
Monocytes Absolute: 1.2 10*3/uL — ABNORMAL HIGH (ref 0.1–0.9)
Monocytes: 10 %
Neutrophils Absolute: 9.2 10*3/uL — ABNORMAL HIGH (ref 1.4–7.0)
Neutrophils: 77 %
Platelets: 186 10*3/uL (ref 150–450)
RBC: 5.18 x10E6/uL (ref 4.14–5.80)
RDW: 15.2 % (ref 12.3–15.4)
WBC: 11.9 10*3/uL — ABNORMAL HIGH (ref 3.4–10.8)

## 2018-09-04 LAB — BASIC METABOLIC PANEL
BUN/Creatinine Ratio: 15 (ref 10–24)
BUN: 18 mg/dL (ref 8–27)
CO2: 26 mmol/L (ref 20–29)
Calcium: 9.8 mg/dL (ref 8.6–10.2)
Chloride: 102 mmol/L (ref 96–106)
Creatinine, Ser: 1.19 mg/dL (ref 0.76–1.27)
GFR calc Af Amer: 68 mL/min/{1.73_m2} (ref >59)
GFR calc non Af Amer: 59 mL/min/{1.73_m2} — ABNORMAL LOW (ref >59)
Glucose: 135 mg/dL — ABNORMAL HIGH (ref 65–99)
Potassium: 4.6 mmol/L (ref 3.5–5.2)
Sodium: 135 mmol/L (ref 134–144)

## 2018-09-04 MED ORDER — APIXABAN 5 MG PO TABS: 5.0000 mg | ORAL_TABLET | Freq: Two times a day (BID) | ORAL | 11 refills | Status: DC

## 2018-09-04 NOTE — H&P (View-Only) (Signed)
Cardiology Office Note:    Date:  09/04/2018   ID:  Tracey, Stewart [birth year], MRN [id number]  PCP:  Wenda Low, MD  Cardiologist:  No primary care provider on file.  Electrophysiologist:  None   Referring MD: Wenda Low, MD     History of Present Illness:    KEELAN TRIPODI is a 77 y.o. male here for follow-up of coronary artery disease with prior catheterization showing mid LAD 85% stenosis immediately after a bifurcation of large diagonal branch.  Medical management after discussion with Dr. Irish Lack.  EF 55%.  He was started on metoprolol and high intensity Lipitor 40 mg.  He saw Dr. Lysle Rubens yesterday who noticed his elevated heart rate.  Ultimately, he does say that he has had no get up and go.  He has had some more shortness of breath with activity.  He admits that he drinks 2-3 alcohol drinks and then feels better.  No chest pain.  No anginal symptoms.  He does snore.  He has not had any recent fevers or chills.  Blood pressure currently is stable at 124/86.  Heart rate is 134.  Prior ECG showed sinus bradycardia rate 52.  He is currently taking Toprol 25 mg a day.  He is not on anticoagulation.  Past Medical History:  Diagnosis Date  . Blood dyscrasia    cll remission  . CLL (chronic lymphocytic leukemia) ([place]) 09/10/2013  . Diabetes mellitus, type II ([place])   . GERD (gastroesophageal reflux disease)    occ tums  . H/O cardiac catheterization   . H/O exercise stress test 1999, 2002  . Hypertension   . Hypertriglyceridemia   . Lymphocytosis   . Obesity     Past Surgical History:  Procedure Laterality Date  . CHOLECYSTECTOMY N/A 07/12/2016   Procedure: LAPAROSCOPIC CHOLECYSTECTOMY;  Surgeon: Donnie Mesa, MD;  Location: [facility] [place];  Service: General;  Laterality: N/A;  . FOOT SURGERY Right   . NASAL SINUS SURGERY    . TONSILLECTOMY    . UMBILICAL HERNIA REPAIR N/A 07/12/2016   Procedure: [place];  Surgeon: Donnie Mesa, MD;  Location: [facility];   Service: General;  Laterality: N/A;  . vericose vein stripping      Current Medications: Current Meds  Medication Sig  . atorvastatin (LIPITOR) 40 MG tablet Take 40 mg by mouth daily.  . Cholecalciferol (VITAMIN D PO) Take 1 tablet by mouth daily.  . fish oil-omega-3 fatty acids 1000 MG capsule Take 2 g by mouth daily.   Marland Kitchen lisinopril-hydrochlorothiazide (PRINZIDE,ZESTORETIC) 10-12.5 MG per tablet Take 1 tablet by mouth every morning.   . metFORMIN (GLUCOPHAGE) 1000 MG tablet Take 1,000 mg by mouth 2 (two) times daily with a meal.  . metoprolol succinate (TOPROL-XL) 25 MG 24 hr tablet Take 25 mg by mouth every evening.   . [DISCONTINUED] aspirin 81 MG tablet Take 81 mg by mouth every other day.      Allergies:   Patient has no known allergies.   Social History   Socioeconomic History  . Marital status: Married    Spouse name: Not on file  . Number of children: 3  . Years of education: Not on file  . Highest education level: Not on file  Occupational History    Employer: [employer]    Comment: retied Audiological scientist; asphalt work   Social Needs  . Financial resource strain: Not on file  . Food insecurity:    Worry: Not on file  Inability: Not on file  . Transportation needs:    Medical: Not on file    Non-medical: Not on file  Tobacco Use  . Smoking status: Former Smoker    Packs/day: 3.00    Years: 20.00    Pack years: 60.00    Last attempt to quit: 09/19/1988    Years since quitting: 29.9  . Smokeless tobacco: Never Used  Substance and Sexual Activity  . Alcohol use: Yes    Alcohol/week: 14.0 standard drinks    Types: 14 Shots of liquor per week  . Drug use: No  . Sexual activity: Not on file  Lifestyle  . Physical activity:    Days per week: Not on file    Minutes per session: Not on file  . Stress: Not on file  Relationships  . Social connections:    Talks on phone: Not on file    Gets together: Not on file    Attends religious service: Not on file    Active  member of club or organization: Not on file    Attends meetings of clubs or organizations: Not on file    Relationship status: Not on file  Other Topics Concern  . Not on file  Social History Narrative  . Not on file     Family History: The patient's family history includes Cancer in his father; Heart attack in his mother.  ROS:   Please see the history of present illness.     All other systems reviewed and are negative.  EKGs/Labs/Other Studies Reviewed:    The following studies were reviewed today:  Cardiac catheterization 01/17/13: Stress test moderate risk with inferolateral mid to apex ischemia. 1. Mid LAD disease immediately after the bifurcation of a large diagonal branch of 85% stenosis, then 15 mm distal to that, 95% calcified stenosis, then distal LAD diffuse disease with lesions of up to 80-90%. Nitroglycerin administered with mild increase in caliber of the distal vessel. There is also small caliber posterior lateral branch 90% proximal stenosis, collateral blood flow. There is small caliber obtuse marginal branch stenosis/occlusion as well with left to left collaterals filling retrograde.  2. Normal left ventricular systolic function. LVEDP 6 mmHg. Ejection fraction 55%. No significant wall motion abnormalities. Have discussed this with Dr. Irish Lack.  EKG:  EKG is  ordered today.  The ekg ordered today demonstrates atrial flutter with 2-1 conduction, left anterior fascicular block.  Personally reviewed and interpreted.  Prior showed sinus bradycardia in the 50s.  Recent Labs: 03/06/2018: ALT 17; BUN 21; Creatinine 1.58; Hemoglobin 13.7; Platelet Count 142; Potassium 4.8; Sodium 137  Recent Lipid Panel No results found for: CHOL, TRIG, HDL, CHOLHDL, VLDL, LDLCALC, LDLDIRECT  Physical Exam:    VS:  BP 124/86   Pulse (!) 134   Ht 6\' 4"  (1.93 m)   Wt (!) 305 lb 12.8 oz (138.7 kg)   BMI 37.22 kg/m     Wt Readings from Last 3 Encounters:  09/04/18 (!) 305 lb 12.8 oz  (138.7 kg)  03/06/18 (!) 316 lb 11.2 oz (143.7 kg)  03/06/17 (!) 309 lb 3.2 oz (140.3 kg)     GEN: Obese well nourished, well developed in no acute distress HEENT: Normal NECK: No JVD; No carotid bruits LYMPHATICS: No lymphadenopathy CARDIAC: Tachycardic regular, no murmurs, rubs, gallops RESPIRATORY:  Clear to auscultation without rales, wheezing or rhonchi  ABDOMEN: Soft, non-tender, non-distended MUSCULOSKELETAL:  No edema; No deformity  SKIN: Warm and dry NEUROLOGIC:  Alert and oriented x  3 PSYCHIATRIC:  Normal affect   ASSESSMENT:    1. Atrial flutter, unspecified type ([place])   2. Morbid obesity ([place])   3. Diabetes mellitus with coincident hypertension ([place])   4. Coronary artery disease involving native coronary artery of native heart without angina pectoris    PLAN:    In order of problems listed above:  Atrial Flutter  - 134, 2:1 conduction -We will go ahead and start him on Eliquis 5 mg twice a day.  We will stop his aspirin 81 mg.  Chads vas score is 4, age greater than 22, hypertension, coronary artery disease -We discussed TEE cardioversion, possibly tomorrow afternoon if we are able to get 3 doses of Eliquis effectively.  Risks and benefits of procedure were discussed.  He is willing to proceed.  Coronary artery disease - Overall doing quite well.  See above.  Continue with optimizing medical management.  Weight loss.  CLL - Oncology noted.  Angina - Atypical right arm pain sometimes with motion.  Perhaps musculoskeletal.  Dyspnea on exertion -Likely multifactorial, morbid obesity for instance.  Morbid obesity -Continue with diet exercise carbohydrate control.  Mixed hyperlipidemia -Atorvastatin 40 mg.  LDL 52.  Diabetes with HTN -Dr. Deforest Hoyles.  We will see soon after cardioversion.  Medication Adjustments/Labs and Tests Ordered: Current medicines are reviewed at length with the patient today.  Concerns regarding medicines are outlined above.    Orders Placed This Encounter  Procedures  . Basic metabolic panel  . CBC with Differential/Platelet  . EKG 12-Lead   Meds ordered this encounter  Medications  . apixaban (ELIQUIS) 5 MG TABS tablet    Sig: Take 1 tablet (5 mg total) by mouth 2 (two) times daily.    Dispense:  60 tablet    Refill:  11    Patient Instructions  Medication Instructions:  1) STOP ASPIRIN 2) START ELIQUIS 5 mg twice daily  Labwork: TODAY: BMET, CBC  Testing/Procedures: Your physician has requested that you have a TEE. During a TEE, sound waves are used to create images of your heart. It provides your doctor with information about the size and shape of your heart and how well your heart's chambers and valves are working. In this test, a transducer is attached to the end of a flexible tube that's guided down your throat and into your esophagus (the tube leading from you mouth to your stomach) to get a more detailed image of your heart. You are not awake for the procedure. Please see the instruction sheet given to you today. For further information please visit [email or web address].  Your physician has recommended that you have a Cardioversion (DCCV). Electrical Cardioversion uses a jolt of electricity to your heart either through paddles or wired patches attached to your chest. This is a controlled, usually prescheduled, procedure. Defibrillation is done under light anesthesia in the hospital, and you usually go home the day of the procedure. This is done to get your heart back into a normal rhythm. You are not awake for the procedure. Please see the instruction sheet given to you today.  Follow-Up: YOU WILL BE CALLED WITH FOLLOW-UP.     Signed, [name], MD  09/04/2018 10:07 AM    [place]

## 2018-09-04 NOTE — Telephone Encounter (Signed)
Notes recorded by Frederik Schmidt, RN on 09/04/2018 at 4:55 PM EST The patient has been notified of the result and verbalized understanding. All questions (if any) were answered. Frederik Schmidt, RN 09/04/2018 4:55 PM

## 2018-09-04 NOTE — Patient Instructions (Addendum)
Medication Instructions:  1) STOP ASPIRIN 2) START ELIQUIS 5 mg twice daily  Labwork: TODAY: BMET, CBC  Testing/Procedures: Your physician has requested that you have a TEE. During a TEE, sound waves are used to create images of your heart. It provides your doctor with information about the size and shape of your heart and how well your heart's chambers and valves are working. In this test, a transducer is attached to the end of a flexible tube that's guided down your throat and into your esophagus (the tube leading from you mouth to your stomach) to get a more detailed image of your heart. You are not awake for the procedure. Please see the instruction sheet given to you today. For further information please visit [email or web address].  Your physician has recommended that you have a Cardioversion (DCCV). Electrical Cardioversion uses a jolt of electricity to your heart either through paddles or wired patches attached to your chest. This is a controlled, usually prescheduled, procedure. Defibrillation is done under light anesthesia in the hospital, and you usually go home the day of the procedure. This is done to get your heart back into a normal rhythm. You are not awake for the procedure. Please see the instruction sheet given to you today.  Follow-Up: YOU WILL BE CALLED WITH FOLLOW-UP.

## 2018-09-04 NOTE — Progress Notes (Signed)
Cardiology Office Note:    Date:  09/04/2018   ID:  Kinnick, Maus 1941/07/28, MRN [id number]  PCP:  Wenda Low, MD  Cardiologist:  No primary care provider on file.  Electrophysiologist:  None   Referring MD: Wenda Low, MD     History of Present Illness:    Derek [name] is a 77 y.o. male here for follow-up of coronary artery disease with prior catheterization showing mid LAD 85% stenosis immediately after a bifurcation of large diagonal branch.  Medical management after discussion with Dr. Irish Lack.  EF 55%.  He was started on metoprolol and high intensity Lipitor 40 mg.  He saw Dr. Lysle Rubens yesterday who noticed his elevated heart rate.  Ultimately, he does say that he has had no get up and go.  He has had some more shortness of breath with activity.  He admits that he drinks 2-3 alcohol drinks and then feels better.  No chest pain.  No anginal symptoms.  He does snore.  He has not had any recent fevers or chills.  Blood pressure currently is stable at 124/86.  Heart rate is 134.  Prior ECG showed sinus bradycardia rate 52.  He is currently taking Toprol 25 mg a day.  He is not on anticoagulation.  Past Medical History:  Diagnosis Date  . Blood dyscrasia    cll remission  . 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 Diabetes mellitus, type II ([place])   . GERD (gastroesophageal reflux disease)    occ tums  . H/O cardiac catheterization   . H/O exercise stress test 1999, 2002  . Hypertension   . Hypertriglyceridemia   . Lymphocytosis   . Obesity     Past Surgical History:  Procedure Laterality Date  . CHOLECYSTECTOMY N/A 07/12/2016   Procedure: LAPAROSCOPIC CHOLECYSTECTOMY;  Surgeon: Donnie Mesa, MD;  Location: [facility];  Service: General;  Laterality: N/A;  . FOOT SURGERY Right   . NASAL SINUS SURGERY    . TONSILLECTOMY    . UMBILICAL HERNIA REPAIR N/A 07/12/2016   Procedure: [place];  Surgeon: Donnie Mesa, MD;  Location: [facility];   Service: General;  Laterality: N/A;  . vericose vein stripping      Current Medications: Current Meds  Medication Sig  . atorvastatin (LIPITOR) 40 MG tablet Take 40 mg by mouth daily.  . Cholecalciferol (VITAMIN D PO) Take 1 tablet by mouth daily.  . fish oil-omega-3 fatty acids 1000 MG capsule Take 2 g by mouth daily.   Marland Kitchen lisinopril-hydrochlorothiazide (PRINZIDE,ZESTORETIC) 10-12.5 MG per tablet Take 1 tablet by mouth every morning.   . metFORMIN (GLUCOPHAGE) 1000 MG tablet Take 1,000 mg by mouth 2 (two) times daily with a meal.  . metoprolol succinate (TOPROL-XL) 25 MG 24 hr tablet Take 25 mg by mouth every evening.   . [DISCONTINUED] aspirin 81 MG tablet Take 81 mg by mouth every other day.      Allergies:   Patient has no known allergies.   Social History   Socioeconomic History  . Marital status: Married    Spouse name: Not on file  . Number of children: 3  . Years of education: Not on file  . Highest education level: Not on file  Occupational History    Employer: [employer]    Comment: retied Audiological scientist; asphalt work   Social Needs  . Financial resource strain: Not on file  . Food insecurity:    Worry: Not on file  Inability: Not on file  . Transportation needs:    Medical: Not on file    Non-medical: Not on file  Tobacco Use  . Smoking status: Former Smoker    Packs/day: 3.00    Years: 20.00    Pack years: 60.00    Last attempt to quit: 09/19/1988    Years since quitting: 29.9  . Smokeless tobacco: Never Used  Substance and Sexual Activity  . Alcohol use: Yes    Alcohol/week: 14.0 standard drinks    Types: 14 Shots of liquor per week  . Drug use: No  . Sexual activity: Not on file  Lifestyle  . Physical activity:    Days per week: Not on file    Minutes per session: Not on file  . Stress: Not on file  Relationships  . Social connections:    Talks on phone: Not on file    Gets together: Not on file    Attends religious service: Not on file    Active  member of club or organization: Not on file    Attends meetings of clubs or organizations: Not on file    Relationship status: Not on file  Other Topics Concern  . Not on file  Social History Narrative  . Not on file     Family History: The patient's family history includes Cancer in his father; Heart attack in his mother.  ROS:   Please see the history of present illness.     All other systems reviewed and are negative.  EKGs/Labs/Other Studies Reviewed:    The following studies were reviewed today:  Cardiac catheterization 01/17/13: Stress test moderate risk with inferolateral mid to apex ischemia. 1. Mid LAD disease immediately after the bifurcation of a large diagonal branch of 85% stenosis, then 15 mm distal to that, 95% calcified stenosis, then distal LAD diffuse disease with lesions of up to 80-90%. Nitroglycerin administered with mild increase in caliber of the distal vessel. There is also small caliber posterior lateral branch 90% proximal stenosis, collateral blood flow. There is small caliber obtuse marginal branch stenosis/occlusion as well with left to left collaterals filling retrograde.  2. Normal left ventricular systolic function. LVEDP 6 mmHg. Ejection fraction 55%. No significant wall motion abnormalities. Have discussed this with Dr. Irish Lack.  EKG:  EKG is  ordered today.  The ekg ordered today demonstrates atrial flutter with 2-1 conduction, left anterior fascicular block.  Personally reviewed and interpreted.  Prior showed sinus bradycardia in the 50s.  Recent Labs: 03/06/2018: ALT 17; BUN 21; Creatinine 1.58; Hemoglobin 13.7; Platelet Count 142; Potassium 4.8; Sodium 137  Recent Lipid Panel No results found for: CHOL, TRIG, HDL, CHOLHDL, VLDL, LDLCALC, LDLDIRECT  Physical Exam:    VS:  BP 124/86   Pulse (!) 134   Ht 6\' 4"  (1.93 m)   Wt (!) 305 lb 12.8 oz (138.7 kg)   BMI 37.22 kg/m     Wt Readings from Last 3 Encounters:  09/04/18 (!) 305 lb 12.8 oz  (138.7 kg)  03/06/18 (!) 316 lb 11.2 oz (143.7 kg)  03/06/17 (!) 309 lb 3.2 oz (140.3 kg)     GEN: Obese well nourished, well developed in no acute distress HEENT: Normal NECK: No JVD; No carotid bruits LYMPHATICS: No lymphadenopathy CARDIAC: Tachycardic regular, no murmurs, rubs, gallops RESPIRATORY:  Clear to auscultation without rales, wheezing or rhonchi  ABDOMEN: Soft, non-tender, non-distended MUSCULOSKELETAL:  No edema; No deformity  SKIN: Warm and dry NEUROLOGIC:  Alert and oriented x  3 PSYCHIATRIC:  Normal affect   ASSESSMENT:    1. Atrial flutter, unspecified type ([place])   2. Morbid obesity ([place])   3. Diabetes mellitus with coincident hypertension ([place])   4. Coronary artery disease involving native coronary artery of native heart without angina pectoris    PLAN:    In order of problems listed above:  Atrial Flutter  - 134, 2:1 conduction -We will go ahead and start him on Eliquis 5 mg twice a day.  We will stop his aspirin 81 mg.  Chads vas score is 4, age greater than 81, hypertension, coronary artery disease -We discussed TEE cardioversion, possibly tomorrow afternoon if we are able to get 3 doses of Eliquis effectively.  Risks and benefits of procedure were discussed.  He is willing to proceed.  Coronary artery disease - Overall doing quite well.  See above.  Continue with optimizing medical management.  Weight loss.  CLL - Oncology noted.  Angina - Atypical right arm pain sometimes with motion.  Perhaps musculoskeletal.  Dyspnea on exertion -Likely multifactorial, morbid obesity for instance.  Morbid obesity -Continue with diet exercise carbohydrate control.  Mixed hyperlipidemia -Atorvastatin 40 mg.  LDL 52.  Diabetes with HTN -Dr. Deforest Hoyles.  We will see soon after cardioversion.  Medication Adjustments/Labs and Tests Ordered: Current medicines are reviewed at length with the patient today.  Concerns regarding medicines are outlined above.    Orders Placed This Encounter  Procedures  . Basic metabolic panel  . CBC with Differential/Platelet  . EKG 12-Lead   Meds ordered this encounter  Medications  . apixaban (ELIQUIS) 5 MG TABS tablet    Sig: Take 1 tablet (5 mg total) by mouth 2 (two) times daily.    Dispense:  60 tablet    Refill:  11    Patient Instructions  Medication Instructions:  1) STOP ASPIRIN 2) START ELIQUIS 5 mg twice daily  Labwork: TODAY: BMET, CBC  Testing/Procedures: Your physician has requested that you have a TEE. During a TEE, sound waves are used to create images of your heart. It provides your doctor with information about the size and shape of your heart and how well your heart's chambers and valves are working. In this test, a transducer is attached to the end of a flexible tube that's guided down your throat and into your esophagus (the tube leading from you mouth to your stomach) to get a more detailed image of your heart. You are not awake for the procedure. Please see the instruction sheet given to you today. For further information please visit [email or web address].  Your physician has recommended that you have a Cardioversion (DCCV). Electrical Cardioversion uses a jolt of electricity to your heart either through paddles or wired patches attached to your chest. This is a controlled, usually prescheduled, procedure. Defibrillation is done under light anesthesia in the hospital, and you usually go home the day of the procedure. This is done to get your heart back into a normal rhythm. You are not awake for the procedure. Please see the instruction sheet given to you today.  Follow-Up: YOU WILL BE CALLED WITH FOLLOW-UP.     Signed, [name], MD  09/04/2018 10:07 AM    [place]

## 2018-09-04 NOTE — Telephone Encounter (Signed)
-----   Message from Jerline Pain, MD sent at 09/04/2018  4:51 PM EST ----- Okay for cardioversion. [name], MD

## 2018-09-05 ENCOUNTER — Encounter (HOSPITAL_COMMUNITY)
Admission: RE | Disposition: A | Discharge status: Home / Self Care | Payer: Self-pay | Source: Home / Self Care | Attending: Cardiology

## 2018-09-05 ENCOUNTER — Ambulatory Visit (HOSPITAL_BASED_OUTPATIENT_CLINIC_OR_DEPARTMENT_OTHER): Payer: Medicare Other

## 2018-09-05 ENCOUNTER — Ambulatory Visit (HOSPITAL_COMMUNITY): Payer: Medicare Other | Admitting: Anesthesiology

## 2018-09-05 ENCOUNTER — Other Ambulatory Visit: Payer: Self-pay

## 2018-09-05 ENCOUNTER — Encounter (HOSPITAL_COMMUNITY): Payer: Self-pay | Admitting: *Deleted

## 2018-09-05 ENCOUNTER — Ambulatory Visit (HOSPITAL_COMMUNITY)
Admission: RE | Admit: 2018-09-05 | Discharge: 2018-09-05 | Disposition: A | Discharge status: Home / Self Care | Payer: Medicare Other | Attending: Cardiology | Admitting: Cardiology

## 2018-09-05 DIAGNOSIS — Z7982 Long term (current) use of aspirin: Secondary | ICD-10-CM | POA: Diagnosis not present

## 2018-09-05 DIAGNOSIS — I2510 Atherosclerotic heart disease of native coronary artery without angina pectoris: Secondary | ICD-10-CM | POA: Diagnosis not present

## 2018-09-05 DIAGNOSIS — Z79899 Other long term (current) drug therapy: Secondary | ICD-10-CM | POA: Insufficient documentation

## 2018-09-05 DIAGNOSIS — Z87891 Personal history of nicotine dependence: Secondary | ICD-10-CM | POA: Insufficient documentation

## 2018-09-05 DIAGNOSIS — E119 Type 2 diabetes mellitus without complications: Secondary | ICD-10-CM | POA: Insufficient documentation

## 2018-09-05 DIAGNOSIS — Z6837 Body mass index (BMI) 37.0-37.9, adult: Secondary | ICD-10-CM | POA: Diagnosis not present

## 2018-09-05 DIAGNOSIS — I4891 Unspecified atrial fibrillation: Secondary | ICD-10-CM | POA: Diagnosis not present

## 2018-09-05 DIAGNOSIS — C9110 Chronic lymphocytic leukemia of B-cell type not having achieved remission: Secondary | ICD-10-CM | POA: Insufficient documentation

## 2018-09-05 DIAGNOSIS — I340 Nonrheumatic mitral (valve) insufficiency: Secondary | ICD-10-CM

## 2018-09-05 DIAGNOSIS — I10 Essential (primary) hypertension: Secondary | ICD-10-CM | POA: Insufficient documentation

## 2018-09-05 DIAGNOSIS — Z809 Family history of malignant neoplasm, unspecified: Secondary | ICD-10-CM | POA: Insufficient documentation

## 2018-09-05 DIAGNOSIS — I4892 Unspecified atrial flutter: Secondary | ICD-10-CM | POA: Insufficient documentation

## 2018-09-05 DIAGNOSIS — Z8249 Family history of ischemic heart disease and other diseases of the circulatory system: Secondary | ICD-10-CM | POA: Insufficient documentation

## 2018-09-05 DIAGNOSIS — E785 Hyperlipidemia, unspecified: Secondary | ICD-10-CM | POA: Diagnosis not present

## 2018-09-05 DIAGNOSIS — I484 Atypical atrial flutter: Secondary | ICD-10-CM | POA: Diagnosis not present

## 2018-09-05 DIAGNOSIS — Z7984 Long term (current) use of oral hypoglycemic drugs: Secondary | ICD-10-CM | POA: Insufficient documentation

## 2018-09-05 DIAGNOSIS — E782 Mixed hyperlipidemia: Secondary | ICD-10-CM | POA: Diagnosis not present

## 2018-09-05 DIAGNOSIS — K219 Gastro-esophageal reflux disease without esophagitis: Secondary | ICD-10-CM | POA: Diagnosis not present

## 2018-09-05 HISTORY — PX: CARDIOVERSION: SHX1299

## 2018-09-05 HISTORY — PX: TEE WITHOUT CARDIOVERSION: SHX5443

## 2018-09-05 LAB — GLUCOSE, CAPILLARY: Glucose-Capillary: 147 mg/dL — ABNORMAL HIGH (ref 70–99)

## 2018-09-05 SURGERY — ECHOCARDIOGRAM, TRANSESOPHAGEAL
Anesthesia: General

## 2018-09-05 MED ORDER — LIDOCAINE HCL (CARDIAC) PF 100 MG/5ML IV SOSY
PREFILLED_SYRINGE | INTRAVENOUS | Status: DC | PRN
  Administered 2018-09-05: 100 mg via INTRAVENOUS

## 2018-09-05 MED ORDER — PROPOFOL 10 MG/ML IV BOLUS
INTRAVENOUS | Status: DC | PRN
  Administered 2018-09-05 (×2): 20 mg via INTRAVENOUS
  Administered 2018-09-05: 15 mg via INTRAVENOUS
  Administered 2018-09-05: 20 mg via INTRAVENOUS

## 2018-09-05 MED ORDER — PHENYLEPHRINE HCL 10 MG/ML IJ SOLN
INTRAMUSCULAR | Status: DC | PRN
  Administered 2018-09-05 (×2): 80 ug via INTRAVENOUS

## 2018-09-05 MED ORDER — PROPOFOL 500 MG/50ML IV EMUL
INTRAVENOUS | Status: DC | PRN
  Administered 2018-09-05: 75 ug/kg/min via INTRAVENOUS

## 2018-09-05 MED ORDER — SODIUM CHLORIDE 0.9 % IV SOLN
INTRAVENOUS | Status: DC
  Administered 2018-09-05 (×2): via INTRAVENOUS

## 2018-09-05 NOTE — Interval H&P Note (Signed)
History and Physical Interval Note:  09/05/2018 12:54 PM  Derek [name]  has presented today for surgery, with the diagnosis of a fib  The various methods of treatment have been discussed with the patient and family. After consideration of risks, benefits and other options for treatment, the patient has consented to  Procedure(s): TRANSESOPHAGEAL ECHOCARDIOGRAM (TEE) (N/A) CARDIOVERSION (N/A) as a surgical intervention .  The patient's history has been reviewed, patient examined, no change in status, stable for surgery.  I have reviewed the patient's chart and labs.  Questions were answered to the patient's satisfaction.     [employer]

## 2018-09-05 NOTE — Progress Notes (Signed)
  Echocardiogram Echocardiogram Transesophageal has been performed.  Jannett Celestine 09/05/2018, 2:48 PM

## 2018-09-05 NOTE — CV Procedure (Signed)
    Electrical Cardioversion Procedure Note ALANDIS BLUEMEL [id number] December 25, 1940  Procedure: Electrical Cardioversion Indications:  Atrial Flutter  Time Out: Verified patient identification, verified procedure,medications/allergies/relevent history reviewed, required imaging and test results available.  Performed  Procedure Details  The patient was NPO after midnight. Anesthesia was administered at the beside  by Dr. Smith Robert propofol.  Cardioversion was performed with synchronized biphasic defibrillation via AP pads with 120 joules.  1 attempt(s) were performed.  The patient converted to normal sinus rhythm. The patient tolerated the procedure well   TEE: Ejection fraction appeared 40 to 45% pre-cardioversion with tachycardia.  Anterior wall hypokinesis.  Post cardioversion ejection fraction improved.  Moderate mitral regurgitation noted.  No left atrial appendage thrombus.  IMPRESSION:  Successful cardioversion of atrial flutter Post ECHO TTE image appeared improved (EF 50%)  If atrial flutter were to return, we would need to proceed with antiarrhythmic therapy/EP consultation.  Flutter did not appear to be typical on original EKG.  [name] 09/05/2018, 2:40 PM

## 2018-09-05 NOTE — Anesthesia Preprocedure Evaluation (Addendum)
Anesthesia Evaluation  Patient identified by MRN, date of birth, ID band Patient awake    Reviewed: Allergy & Precautions, NPO status , Patient's Chart, lab work & pertinent test results  Airway Mallampati: II  TM Distance: >3 FB Neck ROM: Full    Dental  (+) Edentulous Upper, Edentulous Lower, Dental Advisory Given   Pulmonary former smoker,    breath sounds clear to auscultation- rhonchi       Cardiovascular hypertension, Pt. on home beta blockers + CAD   Rhythm:Irregular Rate:Abnormal     Neuro/Psych negative neurological ROS  negative psych ROS   GI/Hepatic Neg liver ROS, GERD  ,  Endo/Other  diabetes, Type 2, Oral Hypoglycemic Agents  Renal/GU negative Renal ROS     Musculoskeletal negative musculoskeletal ROS (+)   Abdominal (+) + obese,   Peds  Hematology   Anesthesia Other Findings CLL  Reproductive/Obstetrics                           Anesthesia Physical Anesthesia Plan  ASA: III  Anesthesia Plan: General   Post-op Pain Management:    Induction: Intravenous  PONV Risk Score and Plan: Propofol infusion and Treatment may vary due to age or medical condition  Airway Management Planned: Natural Airway and Nasal Cannula  Additional Equipment:   Intra-op Plan:   Post-operative Plan:   Informed Consent: I have reviewed the patients History and Physical, chart, labs and discussed the procedure including the risks, benefits and alternatives for the proposed anesthesia with the patient or authorized representative who has indicated his/her understanding and acceptance.     Plan Discussed with: CRNA  Anesthesia Plan Comments:       Anesthesia Quick Evaluation

## 2018-09-05 NOTE — Transfer of Care (Signed)
Immediate Anesthesia Transfer of Care Note  Patient: Derek [name]  Procedure(s) Performed: TRANSESOPHAGEAL ECHOCARDIOGRAM (TEE) (N/A ) CARDIOVERSION (N/A )  Patient Location: Endoscopy Unit  Anesthesia Type:General  Level of Consciousness: awake and alert   Airway & Oxygen Therapy: Patient Spontanous Breathing and Patient connected to nasal cannula oxygen  Post-op Assessment: Report given to RN, Post -op Vital signs reviewed and stable and Patient moving all extremities  Post vital signs: Reviewed and stable  Last Vitals:  Vitals Value Taken Time  BP 95/59 09/05/2018  2:41 PM  Temp 36.7 C 09/05/2018  2:41 PM  Pulse 76 09/05/2018  2:45 PM  Resp 18 09/05/2018  2:45 PM  SpO2 96 % 09/05/2018  2:45 PM  Vitals shown include unvalidated device data.  Last Pain:  Vitals:   09/05/18 1441  TempSrc: Oral  PainSc: 0-No pain         Complications: No apparent anesthesia complications

## 2018-09-05 NOTE — Discharge Instructions (Signed)
TEE ° °YOU HAD AN CARDIAC PROCEDURE TODAY: Refer to the procedure report and other information in the discharge instructions given to you for any specific questions about what was found during the examination. If this information does not answer your questions, please call [facility] office at [phone] to clarify.  ° °DIET: Your first meal following the procedure should be a light meal and then it is ok to progress to your normal diet. A half-sandwich or bowl of soup is an example of a good first meal. Heavy or fried foods are harder to digest and may make you feel nauseous or bloated. Drink plenty of fluids but you should avoid alcoholic beverages for 24 hours. If you had a esophageal dilation, please see attached instructions for diet.  ° °ACTIVITY: Your care partner should take you home directly after the procedure. You should plan to take it easy, moving slowly for the rest of the day. You can resume normal activity the day after the procedure however YOU SHOULD NOT DRIVE, use power tools, machinery or perform tasks that involve climbing or major physical exertion for 24 hours (because of the sedation medicines used during the test).  ° °SYMPTOMS TO REPORT IMMEDIATELY: °A cardiologist can be reached at any hour. Please call [phone] for any of the following symptoms:  °Vomiting of blood or coffee ground material  °New, significant abdominal pain  °New, significant chest pain or pain under the shoulder blades  °Painful or persistently difficult swallowing  °New shortness of breath  °Black, tarry-looking or red, bloody stools ° °FOLLOW UP:  °Please also call with any specific questions about appointments or follow up tests. ° °Electrical Cardioversion, Care After °This sheet gives you information about how to care for yourself after your procedure. Your health care provider may also give you more specific instructions. If you have problems or questions, contact your health care provider. °What can I  expect after the procedure? °After the procedure, it is common to have: °· Some redness on the skin where the shocks were given. °Follow these instructions at home: ° °· Do not drive for 24 hours if you were given a medicine to help you relax (sedative). °· Take over-the-counter and prescription medicines only as told by your health care provider. °· Ask your health care provider how to check your pulse. Check it often. °· Rest for 48 hours after the procedure or as told by your health care provider. °· Avoid or limit your caffeine use as told by your health care provider. °Contact a health care provider if: °· You feel like your heart is beating too quickly or your pulse is not regular. °· You have a serious muscle cramp that does not go away. °Get help right away if: ° °· You have discomfort in your chest. °· You are dizzy or you feel faint. °· You have trouble breathing or you are short of breath. °· Your speech is slurred. °· You have trouble moving an arm or leg on one side of your body. °· Your fingers or toes turn cold or blue. °This information is not intended to replace advice given to you by your health care provider. Make sure you discuss any questions you have with your health care provider. °Document Released: 06/26/2013 Document Revised: 04/08/2016 Document Reviewed: 03/11/2016 °Elsevier Interactive Patient Education © 2019 [employer] ° °

## 2018-09-06 NOTE — Anesthesia Postprocedure Evaluation (Signed)
Anesthesia Post Note  Patient: Derek [name]  Procedure(s) Performed: TRANSESOPHAGEAL ECHOCARDIOGRAM (TEE) (N/A ) CARDIOVERSION (N/A )     Anesthesia Post Evaluation  Last Vitals:  Vitals:   09/05/18 1450 09/05/18 1505  BP: (!) 94/58 (!) 100/45  Pulse: 68 82  Resp: 14 20  Temp:    SpO2: 96% 99%    Last Pain:  Vitals:   09/05/18 1441  TempSrc: Oral  PainSc: 0-No pain                 Effie Berkshire

## 2018-09-13 ENCOUNTER — Telehealth: Payer: Self-pay

## 2018-09-13 ENCOUNTER — Encounter: Payer: Self-pay | Admitting: Cardiovascular Disease

## 2018-09-13 NOTE — Telephone Encounter (Signed)
Patient may meet inclusion criteria for current pharmacy residency project to initiate SGLT2i or GLP1-RA therapy for cardiovascular benefit due to current diagnosis of ASCVD and DM. Patient does not have a recent HA1C on file.  Patient has scheduled appointment with Richardson Dopp, PA-C, on 09/14/18.  Pharmacist will discuss initiation of Jardiance or Victoza and enrollment into study with patient and provider at upcoming office visit.   Relevant labs and dates: Lab Results  Component Value Date   HGBA1C 5.9 (H) 07/11/2016   Lab Results  Component Value Date   CREATININE 1.19 09/04/2018   CREATININE 1.58 (H) 03/06/2018   CREATININE 1.12 07/11/2016   Estimated Creatinine Clearance: 80.4 mL/min (by C-G formula based on SCr of 1.19 mg/dL). Wt Readings from Last 1 Encounters:  09/05/18 (!) 305 lb 12.8 oz (138.7 kg)   BP Readings from Last 1 Encounters:  09/05/18 (!) 100/45    Metformin use: Tor Netters D PGY1 Pharmacy Resident  09/13/2018      10:07 PM

## 2018-09-14 ENCOUNTER — Ambulatory Visit (INDEPENDENT_AMBULATORY_CARE_PROVIDER_SITE_OTHER): Payer: Medicare Other | Admitting: Physician Assistant

## 2018-09-14 ENCOUNTER — Encounter: Payer: Self-pay | Admitting: Physician Assistant

## 2018-09-14 VITALS — BP 130/60 | HR 105 | Ht 76.0 in | Wt 310.1 lb

## 2018-09-14 DIAGNOSIS — I484 Atypical atrial flutter: Secondary | ICD-10-CM | POA: Diagnosis not present

## 2018-09-14 DIAGNOSIS — I25118 Atherosclerotic heart disease of native coronary artery with other forms of angina pectoris: Secondary | ICD-10-CM | POA: Diagnosis not present

## 2018-09-14 DIAGNOSIS — I10 Essential (primary) hypertension: Secondary | ICD-10-CM

## 2018-09-14 DIAGNOSIS — I420 Dilated cardiomyopathy: Secondary | ICD-10-CM | POA: Diagnosis not present

## 2018-09-14 DIAGNOSIS — E119 Type 2 diabetes mellitus without complications: Secondary | ICD-10-CM

## 2018-09-14 DIAGNOSIS — I2510 Atherosclerotic heart disease of native coronary artery without angina pectoris: Secondary | ICD-10-CM

## 2018-09-14 HISTORY — DX: Dilated cardiomyopathy: I42.0

## 2018-09-14 MED ORDER — AMIODARONE HCL 200 MG PO TABS: ORAL_TABLET | ORAL | 3 refills | Status: DC

## 2018-09-14 MED ORDER — METFORMIN HCL 500 MG PO TABS: 500.0000 mg | ORAL_TABLET | Freq: Two times a day (BID) | ORAL | 11 refills | Status: DC

## 2018-09-14 MED ORDER — EMPAGLIFLOZIN 10 MG PO TABS: 10.0000 mg | ORAL_TABLET | Freq: Every day | ORAL | 11 refills | Status: DC

## 2018-09-14 NOTE — Patient Instructions (Signed)
Medication Instructions:  Start: Amiodarone 400 mg, 2 tablets, twice a day for 2 weeks and 400 mg, 2 tablets, daily for 2 weeks, then 200 mg daily after.  If you need a refill on your cardiac medications before your next appointment, please call your pharmacy.   Lab work:  If you have labs (blood work) drawn today and your tests are completely normal, you will receive your results only by: Marland Kitchen MyChart Message (if you have MyChart) OR . A paper copy in the mail If you have any lab test that is abnormal or we need to change your treatment, we will call you to review the results.  Testing/Procedures: Your physician has recommended that you have a pulmonary function test. Pulmonary Function Tests are a group of tests that measure how well air moves in and out of your lungs.  Follow-Up: Your physician recommends that you schedule a follow-up appointment in: 4 weeks with Dr. Marlou Porch or Richardson Dopp PA-C.   Do not miss a dose of Eliquis, if you do, call our office to inform us.

## 2018-09-14 NOTE — Progress Notes (Signed)
Cardiology Office Note:    Date:  09/14/2018   ID:  Derek, [name] 07-13-1941, MRN [id number]  PCP:  Wenda Low, MD  Cardiologist:  [name], MD  Electrophysiologist:  None   Referring MD: Wenda Low, MD   Chief Complaint  Patient presents with  . Follow-up    Status post recent cardioversion    History of Present Illness:    Derek [name] is a 77 y.o. male with coronary artery disease, CLL, diabetes, hypertension, hyperlipidemia, atrial flutter.  He had an abnormal stress test in 2014.  Cardiac Catheterization demonstrated 85% and 95% stenoses after a large diagonal branch, dual 85% stenoses in the diagonal branch and an occluded 2nd obtuse marginal branch.  It was decided to treat him medically.  He was recently seen by Dr. Marlou Porch for the evaluation of new onset atrial flutter with rapid rate.  He was placed on Eliquis (CHADS2-VASc=5 - age x 2, CAD, HTN, DM) and underwent a transesophageal echocardiogram guided cardioversion.  His EF on TEE was 40-45 but did improve to 45-50 immediately post cardioversion.  It was felt he would need anti-arrhythmic Rx if he has recurrent atrial flutter.   [name] returns for follow up post cardioversion.  He is here alone.  He did feel good after his cardioversion.  However, he does not occasional times when he feels fatigued.  He also notes shortness of breath at times as well as bendopnea.  He denies paroxysmal nocturnal dyspnea, leg swelling, syncope or near syncope.  He does have a non-productive cough.  He denies chest pain.    Prior CV studies:   The following studies were reviewed today:  TEE/DCCV 09/05/18 EF 40-45, ant-sept HK, mild MR, no LAA clot, atrial septal lipomatous hypertrophy Post DCCV >> EF appeared to be improved to 45-50  Cardiac Catheterization 01/17/13 LM ok LAD 85 then 95 immediately after large Dx branch; Dx with 85 x 2 in prox/mid vessel LCx irregs; OM2 mid 100 with L-L collats RCA irregs; PLB (small) prox 90  with R-R collats EF 55  Nuclear stress test 2014 Mild ischemia in the basal inferolateral to mid inferolateral region.  Moderate risk myocardial perfusion study.   Myoview 11/02 Normal, EF 56  Past Medical History:  Diagnosis Date  . Blood dyscrasia    cll remission  . 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 Diabetes mellitus, type II ([place])   . Dilated cardiomyopathy ([place]) 09/14/2018   AFlutter >> EF 40-45 pre DCCV and 45-50 post DCCV in 08/2018 // probable tachycardia mediated.  Marland Kitchen GERD (gastroesophageal reflux disease)    occ tums  . H/O cardiac catheterization   . H/O exercise stress test 1999, 2002  . Hypertension   . Hypertriglyceridemia   . Lymphocytosis   . Obesity    Surgical Hx: The patient  has a past surgical history that includes Foot surgery (Right); vericose vein stripping; Nasal sinus surgery; Tonsillectomy; Cholecystectomy (N/A, 07/12/2016); Umbilical hernia repair (N/A, 07/12/2016); TEE without cardioversion (N/A, 09/05/2018); and Cardioversion (N/A, 09/05/2018).   Current Medications: Current Meds  Medication Sig  . apixaban (ELIQUIS) 5 MG TABS tablet Take 1 tablet (5 mg total) by mouth 2 (two) times daily.  Marland Kitchen atorvastatin (LIPITOR) 40 MG tablet Take 40 mg by mouth every evening.   . Cholecalciferol (VITAMIN D PO) Take 1 tablet by mouth daily.  . Cyanocobalamin (VITAMIN B-12 PO) Take 1 tablet by mouth daily.  . hydrOXYzine (ATARAX/VISTARIL) 10 MG tablet Take 10  mg by mouth daily.  Marland Kitchen lisinopril-hydrochlorothiazide (PRINZIDE,ZESTORETIC) 10-12.5 MG tablet Take 1 tablet by mouth daily.  . metoprolol succinate (TOPROL-XL) 25 MG 24 hr tablet Take 25 mg by mouth daily.   . Omega-3 Fatty Acids (FISH OIL) 1200 MG CAPS Take 1,200 mg by mouth daily.  . [DISCONTINUED] metFORMIN (GLUCOPHAGE) 1000 MG tablet Take 1,000 mg by mouth 2 (two) times daily.      Allergies:   Patient has no known allergies.   Social History   Tobacco Use  . Smoking status:  Former Smoker    Packs/day: 3.00    Years: 20.00    Pack years: 60.00    Last attempt to quit: 09/19/1988    Years since quitting: 30.0  . Smokeless tobacco: Never Used  Substance Use Topics  . Alcohol use: Yes    Alcohol/week: 14.0 standard drinks    Types: 14 Shots of liquor per week  . Drug use: No     Family Hx: The patient's family history includes Cancer in his father; Heart attack in his mother.  ROS:   Please see the history of present illness.    ROS All other systems reviewed and are negative.   EKGs/Labs/Other Test Reviewed:    EKG:  EKG is  ordered today.  The ekg ordered today demonstrates AFib, HR 105, LAD, septal Q waves  Recent Labs: 03/06/2018: ALT 17 09/04/2018: BUN 18; Creatinine, Ser 1.19; Hemoglobin 15.3; Platelets 186; Potassium 4.6; Sodium 135   Recent Lipid Panel No results found for: CHOL, TRIG, HDL, CHOLHDL, LDLCALC, LDLDIRECT   From [facility] Tool: Cholesterol, total 157.000 04/10/2018 HDL 39.000 04/10/2018 LDL 46.000 04/10/2018 Triglycerides 356.000 04/10/2018 A1C 7.200 09/03/2018 Hemoglobin 15.000 09/03/2018 Creatinine, Serum 1.190 09/04/2018 Potassium 4.600 09/04/2018 ALT (SGPT) 12.000 09/03/2018 TSH 3.150 09/03/2018 INR 1.000 12/29/2014 Platelets 186.000 09/04/2018   Physical Exam:    VS:  BP 130/60   Pulse (!) 105   Ht 6\' 4"  (1.93 m)   Wt (!) 310 lb 1.9 oz (140.7 kg)   SpO2 97%   BMI 37.75 kg/m     Wt Readings from Last 3 Encounters:  09/14/18 (!) 310 lb 1.9 oz (140.7 kg)  09/05/18 (!) 305 lb 12.8 oz (138.7 kg)  09/04/18 (!) 305 lb 12.8 oz (138.7 kg)     Physical Exam  Constitutional: He is oriented to person, place, and time. He appears well-developed and well-nourished. No distress.  HENT:  Head: Normocephalic and atraumatic.  Eyes: No scleral icterus.  Neck: No JVD present. No thyromegaly present.  Cardiovascular: Normal rate. An irregularly irregular rhythm present.  No murmur heard. Pulmonary/Chest: Effort normal. He has  no wheezes. He has no rales.  Abdominal: Soft.  Musculoskeletal:        General: Edema (trace bilat LE edema) present.  Lymphadenopathy:    He has no cervical adenopathy.  Neurological: He is alert and oriented to person, place, and time.  Skin: Skin is warm and dry.  Psychiatric: He has a normal mood and affect.    ASSESSMENT & PLAN:    Atypical atrial flutter ([place]) He appears to be in atrial fibrillation today with rapid rate.  He seems to be symptomatic in atrial fibrillation.  As he is symptomatic and had reduced LV function with rapid rate, a rhythm control strategy seems most appropriate.  I have recommended he start on Amiodarone.  I reviewed this with Dr .Johnsie Cancel (attending MD) who agreed.    -Start Amiodarone 400 mg bid x 2 weeks, then  400 mg QD x 2 weeks, then 200 mg QD  -Recent LFTs, TSH ok  -Arrange baseline PFTs with DLCO  -Follow up in 4 weeks   -If he is still in AFib at follow up, we will arrange another DCCV  -Continue current dose of Apixaban  Coronary artery disease involving native coronary artery of native heart with other form of angina pectoris ([place]) Severe disease noted in the LAD, Dx and LCx on cath in 2014.  He has been treated medically since then.  He is doing well without significant angina on beta-blocker therapy.  Continue statin, beta-blocker.  He is not on ASA as he is now on Apixaban.  Dilated cardiomyopathy ([place]) Low EF likely due to tachycardia.  EF was improved somewhat post DCCV.  He has some shortness of breath and cough.  But his lungs are clear and neck veins are flat.  He is currently on HCTZ.  I do not think he needs Lasix.  Continue beta-blocker, ACE inhibitor.  He does drink 3-4 vodkas a night.  I have asked him to cut back on this to one a day.   Essential hypertension The patient's blood pressure is controlled on his current regimen.  Continue current therapy.   Diabetes Patient was seen by PharmD today to discuss starting Empagliflozin  given its CV benefits.  Please see separate not in chart.  Patient will start on Empagliflozin.     Dispo:  Return in about 4 weeks (around 10/12/2018) for Close Follow Up w/ Dr. Marlou Porch, or Richardson Dopp, PA-C.   Medication Adjustments/Labs and Tests Ordered: Current medicines are reviewed at length with the patient today.  Concerns regarding medicines are outlined above.  Tests Ordered: Orders Placed This Encounter  Procedures  . EKG 12-Lead  . Pulmonary function test   Medication Changes: Meds ordered this encounter  Medications  . amiodarone (PACERONE) 200 MG tablet    Sig: Take 400 mg, 2 tablets, twice a day for 2 weeks, then 400 mg, 2 tablets, daily for 2 weeks, then 200 mg, 1 tablet, daily. All by mouth.    Dispense:  250 tablet    Refill:  3    Signed, Richardson Dopp, PA-C  09/14/2018 2:04 PM    [address], [place],   [address] Phone: [phone]; Fax: [phone]

## 2018-09-14 NOTE — Telephone Encounter (Addendum)
Discussed Jardiance and Victoza with pt today in clinic. He is willing to start Jardiance 10mg  daily. He has noticed GI issues with his metformin and would like to decrease the dose. Will decrease metformin to 500mg  BID to see if this helps improve GI tolerability. Will f/u with pt in 1 month via telephone to discuss tolerability of Jardiance. Pt is aware to monitor CBGs and BP at home. If he is doing well at 1 month phone follow up, will schedule 3 month f/u in clinic to recheck A1c and BMET. A1c 7.2 on 09/04/18 in [place].

## 2018-09-19 DIAGNOSIS — Z9289 Personal history of other medical treatment: Secondary | ICD-10-CM

## 2018-09-19 HISTORY — DX: Personal history of other medical treatment: Z92.89

## 2018-09-20 ENCOUNTER — Ambulatory Visit (INDEPENDENT_AMBULATORY_CARE_PROVIDER_SITE_OTHER): Payer: Medicare Other | Admitting: Internal Medicine

## 2018-09-20 DIAGNOSIS — I484 Atypical atrial flutter: Secondary | ICD-10-CM | POA: Diagnosis not present

## 2018-09-20 LAB — PULMONARY FUNCTION TEST
DL/VA % pred: 74 %
DL/VA: 3.61 ml/min/mmHg/L
DLCO cor % pred: 58 %
DLCO cor: 22.77 ml/min/mmHg
DLCO unc % pred: 59 %
DLCO unc: 23.21 ml/min/mmHg
FEF 25-75 Post: 3.3 L/sec
FEF 25-75 Pre: 2.96 L/sec
FEF2575-%Change-Post: 11 %
FEF2575-%Pred-Post: 124 %
FEF2575-%Pred-Pre: 111 %
FEV1-%Change-Post: 1 %
FEV1-%Pred-Post: 90 %
FEV1-%Pred-Pre: 88 %
FEV1-Post: 3.33 L
FEV1-Pre: 3.27 L
FEV1FVC-%Change-Post: 5 %
FEV1FVC-%Pred-Pre: 108 %
FEV6-%Change-Post: -3 %
FEV6-%Pred-Post: 83 %
FEV6-%Pred-Pre: 86 %
FEV6-Post: 4.01 L
FEV6-Pre: 4.15 L
FEV6FVC-%Change-Post: 0 %
FEV6FVC-%Pred-Post: 105 %
FEV6FVC-%Pred-Pre: 105 %
FVC-%Change-Post: -3 %
FVC-%Pred-Post: 79 %
FVC-%Pred-Pre: 82 %
FVC-Post: 4.01 L
FVC-Pre: 4.17 L
Post FEV1/FVC ratio: 83 %
Post FEV6/FVC ratio: 100 %
Pre FEV1/FVC ratio: 78 %
Pre FEV6/FVC Ratio: 100 %
RV % pred: 90 %
RV: 2.59 L
TLC % pred: 81 %
TLC: 6.59 L

## 2018-09-20 NOTE — Progress Notes (Signed)
PFT done today. 

## 2018-09-27 ENCOUNTER — Ambulatory Visit: Payer: Medicare Other | Admitting: Cardiology

## 2018-09-27 ENCOUNTER — Encounter: Payer: Self-pay | Admitting: Physician Assistant

## 2018-09-28 ENCOUNTER — Telehealth: Payer: Self-pay

## 2018-09-28 DIAGNOSIS — J988 Other specified respiratory disorders: Secondary | ICD-10-CM

## 2018-09-28 DIAGNOSIS — R0689 Other abnormalities of breathing: Secondary | ICD-10-CM

## 2018-09-28 DIAGNOSIS — Z79899 Other long term (current) drug therapy: Secondary | ICD-10-CM

## 2018-09-28 NOTE — Telephone Encounter (Signed)
Made pt aware of test results and recommendations who verbalized understanding. Per Richardson Dopp, PA-C to be referred to Pulmonology for further evaluation. Will send a copy to PCP. Referral has been placed in Epic.

## 2018-09-28 NOTE — Telephone Encounter (Signed)
-----   Message from Liliane Shi, [place] sent at 09/27/2018  9:22 AM EST ----- Please call patient. The PFTs show no problem with air movement in and out of the lungs. However, there is a suggestion of decreased air exchange in the lungs into the bloodstream.  This may be from a prior hx of smoking. I recently placed him on Amiodarone.  He should see pulmonology to have this evaluated as well as to determine if we can continue Amiodarone long term. Recommendations:  - Continue current medications.  - Please refer to Pulmonology for further evaluation.   - Please send copy to PCP Richardson Dopp, PA-C    09/27/2018 9:16 AM

## 2018-10-12 ENCOUNTER — Telehealth: Payer: Self-pay | Admitting: Pharmacist

## 2018-10-12 DIAGNOSIS — E119 Type 2 diabetes mellitus without complications: Secondary | ICD-10-CM

## 2018-10-12 NOTE — Telephone Encounter (Signed)
Called pt for 1 month follow up since starting Jardiance. He reports tolerating Jardiance well, no issues with copay. His GI issues have improved since decreasing metformin from 1g BID to 500mg  BID. Scheduled f/u A1c and BMET in 2 months and advised pt to contact clinic if he has any problems before then.

## 2018-10-15 DIAGNOSIS — E349 Endocrine disorder, unspecified: Secondary | ICD-10-CM | POA: Diagnosis not present

## 2018-10-15 DIAGNOSIS — E1122 Type 2 diabetes mellitus with diabetic chronic kidney disease: Secondary | ICD-10-CM | POA: Diagnosis not present

## 2018-10-15 DIAGNOSIS — I4892 Unspecified atrial flutter: Secondary | ICD-10-CM | POA: Diagnosis not present

## 2018-10-15 DIAGNOSIS — C9110 Chronic lymphocytic leukemia of B-cell type not having achieved remission: Secondary | ICD-10-CM | POA: Diagnosis not present

## 2018-10-15 DIAGNOSIS — I10 Essential (primary) hypertension: Secondary | ICD-10-CM | POA: Diagnosis not present

## 2018-10-15 DIAGNOSIS — N183 Chronic kidney disease, stage 3 (moderate): Secondary | ICD-10-CM | POA: Diagnosis not present

## 2018-10-16 ENCOUNTER — Ambulatory Visit (INDEPENDENT_AMBULATORY_CARE_PROVIDER_SITE_OTHER): Payer: Medicare Other | Admitting: Physician Assistant

## 2018-10-16 ENCOUNTER — Encounter: Payer: Self-pay | Admitting: Physician Assistant

## 2018-10-16 VITALS — BP 102/58 | HR 45 | Ht 76.0 in | Wt 305.4 lb

## 2018-10-16 DIAGNOSIS — I25119 Atherosclerotic heart disease of native coronary artery with unspecified angina pectoris: Secondary | ICD-10-CM | POA: Diagnosis not present

## 2018-10-16 DIAGNOSIS — I484 Atypical atrial flutter: Secondary | ICD-10-CM

## 2018-10-16 DIAGNOSIS — I420 Dilated cardiomyopathy: Secondary | ICD-10-CM | POA: Diagnosis not present

## 2018-10-16 DIAGNOSIS — R942 Abnormal results of pulmonary function studies: Secondary | ICD-10-CM

## 2018-10-16 DIAGNOSIS — I10 Essential (primary) hypertension: Secondary | ICD-10-CM | POA: Diagnosis not present

## 2018-10-16 LAB — CBC
Hematocrit: 44.3 % (ref 37.5–51.0)
Hemoglobin: 15.1 g/dL (ref 13.0–17.7)
MCH: 29.9 pg (ref 26.6–33.0)
MCHC: 34.1 g/dL (ref 31.5–35.7)
MCV: 88 fL (ref 79–97)
Platelets: 178 10*3/uL (ref 150–450)
RBC: 5.05 x10E6/uL (ref 4.14–5.80)
RDW: 14.2 % (ref 11.6–15.4)
WBC: 9.6 10*3/uL (ref 3.4–10.8)

## 2018-10-16 LAB — BASIC METABOLIC PANEL
BUN/Creatinine Ratio: 16 (ref 10–24)
BUN: 27 mg/dL (ref 8–27)
CO2: 22 mmol/L (ref 20–29)
Calcium: 9.7 mg/dL (ref 8.6–10.2)
Chloride: 96 mmol/L (ref 96–106)
Creatinine, Ser: 1.64 mg/dL — ABNORMAL HIGH (ref 0.76–1.27)
GFR calc Af Amer: 46 mL/min/{1.73_m2} — ABNORMAL LOW (ref >59)
GFR calc non Af Amer: 40 mL/min/{1.73_m2} — ABNORMAL LOW (ref >59)
Glucose: 143 mg/dL — ABNORMAL HIGH (ref 65–99)
Potassium: 5 mmol/L (ref 3.5–5.2)
Sodium: 134 mmol/L (ref 134–144)

## 2018-10-16 MED ORDER — AMIODARONE HCL 200 MG PO TABS: 200.0000 mg | ORAL_TABLET | Freq: Every day | ORAL | 3 refills | Status: DC

## 2018-10-16 MED ORDER — METOPROLOL SUCCINATE ER 25 MG PO TB24: 12.5000 mg | ORAL_TABLET | Freq: Every day | ORAL | 3 refills | Status: DC

## 2018-10-16 NOTE — H&P (View-Only) (Signed)
Cardiology Office Note:    Date:  10/16/2018   ID:  Derek [name], Chew 05-Apr-1941, MRN [id number]  PCP:  Wenda Low, MD  Cardiologist:  [name], MD   Electrophysiologist:  None   Referring MD: Wenda Low, MD   Chief Complaint  Patient presents with  . Follow-up    AFlutter     History of Present Illness:    Derek [name] is a 78 y.o. male with coronary artery disease, CLL, diabetes, hypertension, hyperlipidemia, atrial flutter, cardiomyopathy.  He had an abnormal stress test in 2014.  Cardiac Catheterization demonstrated 85% and 95% stenoses after a large diagonal branch, dual 85% stenoses in the diagonal branch and an occluded 2nd obtuse marginal branch.  It was decided to treat him medically.    Atrial flutter was diagnosed in December 2019.  He was placed on Apixaban for anticoagulation and underwent transesophageal echocardiogram guided cardioversion.  EF was 40-45.  He was last seen in follow-up 09/14/2018.  He was back in atrial flutter/fibrillation with rapid rate.  He was placed on amiodarone with an eye towards cardioversion after adequate load if he remained in atrial fibrillation.  Note, baseline PFTs did demonstrate reduced DLCO and he has been referred to pulmonology.    Mr. Derek [name] returns for follow up.  He is here alone. He has been doing well.  He denies chest pain.  He has chronic shortness of breath without change.  He denies syncope, paroxysmal nocturnal dyspnea, leg swelling.  He did have an episode of hematuria for 1-2 days about 1 month ago.  He missed his Apixaban x 1 dose 3 weeks ago.    Prior CV studies:   The following studies were reviewed today:  PFTs 09/20/2018  FEV1 88% predicted; FEV1/FVC 78%; DLCO cor 58  TEE/DCCV 09/05/18 EF 40-45, ant-sept HK, mild MR, no LAA clot, atrial septal lipomatous hypertrophy Post DCCV >> EF appeared to be improved to 45-50   Cardiac Catheterization 01/17/13 LM ok LAD 85 then 95 immediately after large Dx branch; Dx with  85 x 2 in prox/mid vessel LCx irregs; OM2 mid 100 with L-L collats RCA irregs; PLB (small) prox 90 with R-R collats EF 55   Nuclear stress test 2014 Mild ischemia in the basal inferolateral to mid inferolateral region.  Moderate risk myocardial perfusion study.    Myoview 11/02 Normal, EF 56  Past Medical History:  Diagnosis Date  . Blood dyscrasia    cll remission  . 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 Diabetes mellitus, type II ([place])   . Dilated cardiomyopathy ([place]) 09/14/2018   AFlutter >> EF 40-45 pre DCCV and 45-50 post DCCV in 08/2018 // probable tachycardia mediated.  Derek [name] GERD (gastroesophageal reflux disease)    occ tums  . H/O cardiac catheterization   . H/O exercise stress test 1999, 2002  . Hypertension   . Hypertriglyceridemia   . Lymphocytosis   . Obesity   . Pulmonary Function Test 09/2018   PFTs 09/2018:  FEV1 88% predicted; FEV1/FVC 78%; DLCO cor 58   Surgical Hx: The patient  has a past surgical history that includes Foot surgery (Right); vericose vein stripping; Nasal sinus surgery; Tonsillectomy; Cholecystectomy (N/A, 07/12/2016); Umbilical hernia repair (N/A, 07/12/2016); TEE without cardioversion (N/A, 09/05/2018); and Cardioversion (N/A, 09/05/2018).   Current Medications: Current Meds  Medication Sig  . apixaban (ELIQUIS) 5 MG TABS tablet Take 1 tablet (5 mg total) by mouth 2 (two) times daily.  . empagliflozin (JARDIANCE) 10 MG  TABS tablet Take 10 mg by mouth daily.  . hydrOXYzine (ATARAX/VISTARIL) 10 MG tablet Take 10 mg by mouth daily.  Derek [name] lisinopril-hydrochlorothiazide (PRINZIDE,ZESTORETIC) 10-12.5 MG tablet Take 1 tablet by mouth daily.  . metFORMIN (GLUCOPHAGE) 500 MG tablet Take 1 tablet (500 mg total) by mouth 2 (two) times daily. (Patient not taking: Reported on 10/16/2018)  . Omega-3 Fatty Acids (FISH OIL) 1200 MG CAPS Take 2,400 mg by mouth daily.   Derek [name] testosterone cypionate (DEPOTESTOSTERONE CYPIONATE) 200 MG/ML injection  Inject 200 mg into the muscle every 14 (fourteen) days.   Derek [name] triamcinolone cream (KENALOG) 0.1 % Apply 1 application topically as needed (skin irritation).   . [DISCONTINUED] amiodarone (PACERONE) 200 MG tablet Take 400 mg, 2 tablets, twice a day for 2 weeks, then 400 mg, 2 tablets, daily for 2 weeks, then 200 mg, 1 tablet, daily. All by mouth.  . [DISCONTINUED] amiodarone (PACERONE) 200 MG tablet Take 200 mg by mouth 2 (two) times daily.  . [DISCONTINUED] atorvastatin (LIPITOR) 40 MG tablet Take 40 mg by mouth every evening.   . [DISCONTINUED] Cholecalciferol (VITAMIN D PO) Take 1 tablet by mouth daily.  . [DISCONTINUED] Cyanocobalamin (VITAMIN B-12 PO) Take 500 mcg by mouth daily.   . [DISCONTINUED] metoprolol succinate (TOPROL-XL) 25 MG 24 hr tablet Take 25 mg by mouth daily.      Allergies:   Patient has no known allergies.   Social History   Tobacco Use  . Smoking status: Former Smoker    Packs/day: 3.00    Years: 20.00    Pack years: 60.00    Last attempt to quit: 09/19/1988    Years since quitting: 30.0  . Smokeless tobacco: Never Used  Substance Use Topics  . Alcohol use: Yes    Alcohol/week: 14.0 standard drinks    Types: 14 Shots of liquor per week  . Drug use: No     Family Hx: The patient's family history includes Cancer in his father; Heart attack in his mother.  ROS:   Please see the history of present illness.    Review of Systems  Respiratory: Positive for shortness of breath.   Gastrointestinal: Positive for diarrhea.   All other systems reviewed and are negative.   EKGs/Labs/Other Test Reviewed:    EKG:  EKG is  ordered today.  The ekg ordered today demonstrates AFlutter, variable AV block, HR 45, LAD, septal Q waves, QTc 389  Recent Labs: 03/06/2018: ALT 17 10/16/2018: BUN 27; Creatinine, Ser 1.64; Hemoglobin 15.1; Platelets 178; Potassium 5.0; Sodium 134   Recent Lipid Panel No results found for: CHOL, TRIG, HDL, CHOLHDL, LDLCALC, LDLDIRECT  Physical  Exam:    VS:  BP (!) 102/58   Pulse (!) 45   Ht 6\' 4"  (1.93 m)   Wt (!) 305 lb 6.4 oz (138.5 kg)   SpO2 97%   BMI 37.17 kg/m     Wt Readings from Last 3 Encounters:  10/16/18 (!) 305 lb 6.4 oz (138.5 kg)  09/14/18 (!) 310 lb 1.9 oz (140.7 kg)  09/05/18 (!) 305 lb 12.8 oz (138.7 kg)     Physical Exam  Constitutional: He is oriented to person, place, and time. He appears well-developed and well-nourished. No distress.  HENT:  Head: Normocephalic and atraumatic.  Eyes: No scleral icterus.  Neck: Neck supple. No thyromegaly present.  Cardiovascular: Normal rate, S1 normal and S2 normal. An irregularly irregular rhythm present.  No murmur heard. Pulmonary/Chest: Breath sounds normal. He has no rales.  Abdominal: Soft.  There is no hepatomegaly.  Musculoskeletal:        General: No edema.  Lymphadenopathy:    He has no cervical adenopathy.  Neurological: He is alert and oriented to person, place, and time.  Skin: Skin is warm and dry.  Psychiatric: He has a normal mood and affect.  Nursing note reviewed.   ASSESSMENT & PLAN:    Atypical atrial flutter ([place]) He remains in AFlutter.  HR is now in the 40s.  He overall feels well.  I recommend he proceed with DCCV.  He did miss Apixaban x 1 dose but that was 3 weeks ago.  He should be ok to proceed with DCCV this week or next.  He will need a BMET, CBC today. Decrease Amiodarone to 200 mg QD and decrease Toprol XL to 12.5 mg QD.  Risks and benefits of DCCV were reviewed with the patient and he agrees to proceed.  I reviewed his case with Dr. Marlou Porch today who also agreed with proceeding with DCCV.    -Arrange DCCV this week or next  -Decrease Amiodarone to 200 mg QD  -Decrease Toprol XL to 12.5 mg QD  -FU 2 weeks post DCCV  Coronary artery disease involving native coronary artery of native heart with angina pectoris (HCC) Severe disease noted in the LAD, Dx and LCx on cath in 2014.  He has been treated medically since then.  He  denies angina.  He is not on ASA as he is on Apixaban.  Continue beta-blocker, statin.  Dilated cardiomyopathy ([place]) Likely related to atrial flutter/fibrillation.  EF post DCCV on his TEE was improved.  Consider repeating Echo once he is back in NSR.  Continue beta-blocker, ACE inhibitor.    Essential hypertension The patient's blood pressure is controlled on his current regimen.  Continue current therapy.    Abnormal PFTs (pulmonary function tests) Probably related to prior smoking hx.  Referral to Pulmonology pending.    Hematuria This was painless and has not recurred. Obtain CBC today.  Refer back to Urology for evaluation.   Dispo:  Return in about 2 weeks (around 10/30/2018) for Post Procedure Follow Up w/ Dr. Marlou Porch, or Richardson Dopp, PA-C.   Medication Adjustments/Labs and Tests Ordered: Current medicines are reviewed at length with the patient today.  Concerns regarding medicines are outlined above.  Tests Ordered: Orders Placed This Encounter  Procedures  . Basic metabolic panel  . CBC  . EKG 12-Lead   Medication Changes: Meds ordered this encounter  Medications  . amiodarone (PACERONE) 200 MG tablet    Sig: Take 1 tablet (200 mg total) by mouth daily.    Dispense:  90 tablet    Refill:  3  . metoprolol succinate (TOPROL XL) 25 MG 24 hr tablet    Sig: Take 0.5 tablets (12.5 mg total) by mouth daily.    Dispense:  45 tablet    Refill:  3    Signed, Richardson Dopp, PA-C  10/16/2018 8:21 PM    [facility] [place], [place], [place]  [address] Phone: [phone]; Fax: [phone]

## 2018-10-16 NOTE — Patient Instructions (Addendum)
Medication Instructions:  Your physician has recommended you make the following change in your medication:  1. DECREASE AMIODARONE TO 200 MG DAILY  2. DECREASE TOPROL XL TO 12.5 MG DAILY.   If you need a refill on your cardiac medications before your next appointment, please call your pharmacy.  Lab work: TODAY: BMET, CBC   If you have labs (blood work) drawn today and your tests are completely normal, you will receive your results only by: Marland Kitchen MyChart Message (if you have MyChart) OR . A paper copy in the mail If you have any lab test that is abnormal or we need to change your treatment, we will call you to review the results.  Testing/Procedures: Your physician has recommended that you have a Cardioversion (DCCV). Electrical Cardioversion uses a jolt of electricity to your heart either through paddles or wired patches attached to your chest. This is a controlled, usually prescheduled, procedure. Defibrillation is done under light anesthesia in the hospital, and you usually go home the day of the procedure. This is done to get your heart back into a normal rhythm. You are not awake for the procedure. Please see the instruction sheet given to you today.  Follow-Up: At [facility], you and your health needs are our priority.  As part of our continuing mission to provide you with exceptional heart care, we have created designated Provider Care Teams.  These Care Teams include your primary Cardiologist (physician) and Advanced Practice Providers (APPs -  Physician Assistants and Nurse Practitioners) who all work together to provide you with the care you need, when you need it. . You will need a follow up appointment in: 2 WEEKS.  You may see [name], MD or one of the following Advanced Practice Providers on your designated Care Team: . Richardson Dopp, PA-C . Vin Bhagat, PA-C . Daune Perch, NP YOU HAVE A FOLLOW UP APPOINTMENT WITH DR. HERRICK ON February 7 TH @ 9 AM.   Any Other Special  Instructions Will Be Listed Below (If Applicable).  You are scheduled for a Cardioversion on Monday, February 3RD with Dr. Marlou Porch.  Please arrive at the [facility] (Main Entrance A) at [facility]: [address] [place], [place] [address] at 10:45 am. (1 hour prior to procedure unless lab work is needed; if lab work is needed arrive 1.5 hours ahead)  DIET: Nothing to eat or drink after midnight except a sip of water with medications (see medication instructions below)  Medication Instructions: Hold [place] your anticoagulant: ELIQUIS You will need to continue your anticoagulant after your procedure until you are told by your  Provider that it is safe to stop  Labs: TODAY (BMET, CBC)  You must have a responsible person to drive you home and stay in the waiting area during your procedure. Failure to do so could result in cancellation.  Bring your insurance cards.  *Special Note: Every effort is made to have your procedure done on time. Occasionally there are emergencies that occur at the hospital that may cause delays. Please be patient if a delay does occur.

## 2018-10-16 NOTE — Progress Notes (Signed)
Cardiology Office Note:    Date:  10/16/2018   ID:  Derek, [name] Feb 10, 1941, MRN [id number]  PCP:  Derek Low, MD  Cardiologist:  Derek Furbish, MD   Electrophysiologist:  None   Referring MD: Derek Low, MD   Chief Complaint  Patient presents with  . Follow-up    AFlutter     History of Present Illness:    Derek [name] is a 78 y.o. male with coronary artery disease, CLL, diabetes, hypertension, hyperlipidemia, atrial flutter, cardiomyopathy.  He had an abnormal stress test in 2014.  Cardiac Catheterization demonstrated 85% and 95% stenoses after a large diagonal branch, dual 85% stenoses in the diagonal branch and an occluded 2nd obtuse marginal branch.  It was decided to treat him medically.    Atrial flutter was diagnosed in December 2019.  He was placed on Apixaban for anticoagulation and underwent transesophageal echocardiogram guided cardioversion.  EF was 40-45.  He was last seen in follow-up 09/14/2018.  He was back in atrial flutter/fibrillation with rapid rate.  He was placed on amiodarone with an eye towards cardioversion after adequate load if he remained in atrial fibrillation.  Note, baseline PFTs did demonstrate reduced DLCO and he has been referred to pulmonology.    Derek [name] returns for follow up.  He is here alone. He has been doing well.  He denies chest pain.  He has chronic shortness of breath without change.  He denies syncope, paroxysmal nocturnal dyspnea, leg swelling.  He did have an episode of hematuria for 1-2 days about 1 month ago.  He missed his Apixaban x 1 dose 3 weeks ago.    Prior CV studies:   The following studies were reviewed today:  PFTs 09/20/2018  FEV1 88% predicted; FEV1/FVC 78%; DLCO cor 58  TEE/DCCV 09/05/18 EF 40-45, ant-sept HK, mild MR, no LAA clot, atrial septal lipomatous hypertrophy Post DCCV >> EF appeared to be improved to 45-50   Cardiac Catheterization 01/17/13 LM ok LAD 85 then 95 immediately after large Dx branch; Dx with  85 x 2 in prox/mid vessel LCx irregs; OM2 mid 100 with L-L collats RCA irregs; PLB (small) prox 90 with R-R collats EF 55   Nuclear stress test 2014 Mild ischemia in the basal inferolateral to mid inferolateral region.  Moderate risk myocardial perfusion study.    Myoview 11/02 Normal, EF 56  Past Medical History:  Diagnosis Date  . Blood dyscrasia    cll remission  . 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 Diabetes mellitus, type II ([place])   . Dilated cardiomyopathy ([place]) 09/14/2018   AFlutter >> EF 40-45 pre DCCV and 45-50 post DCCV in 08/2018 // probable tachycardia mediated.  Marland Kitchen GERD (gastroesophageal reflux disease)    occ tums  . H/O cardiac catheterization   . H/O exercise stress test 1999, 2002  . Hypertension   . Hypertriglyceridemia   . Lymphocytosis   . Obesity   . Pulmonary Function Test 09/2018   PFTs 09/2018:  FEV1 88% predicted; FEV1/FVC 78%; DLCO cor 58   Surgical Hx: The patient  has a past surgical history that includes Foot surgery (Right); vericose vein stripping; Nasal sinus surgery; Tonsillectomy; Cholecystectomy (N/A, 07/12/2016); Umbilical hernia repair (N/A, 07/12/2016); TEE without cardioversion (N/A, 09/05/2018); and Cardioversion (N/A, 09/05/2018).   Current Medications: Current Meds  Medication Sig  . apixaban (ELIQUIS) 5 MG TABS tablet Take 1 tablet (5 mg total) by mouth 2 (two) times daily.  . empagliflozin (JARDIANCE) 10 MG  TABS tablet Take 10 mg by mouth daily.  . hydrOXYzine (ATARAX/VISTARIL) 10 MG tablet Take 10 mg by mouth daily.  Marland Kitchen lisinopril-hydrochlorothiazide (PRINZIDE,ZESTORETIC) 10-12.5 MG tablet Take 1 tablet by mouth daily.  . metFORMIN (GLUCOPHAGE) 500 MG tablet Take 1 tablet (500 mg total) by mouth 2 (two) times daily. (Patient not taking: Reported on 10/16/2018)  . Omega-3 Fatty Acids (FISH OIL) 1200 MG CAPS Take 2,400 mg by mouth daily.   Marland Kitchen testosterone cypionate (DEPOTESTOSTERONE CYPIONATE) 200 MG/ML injection  Inject 200 mg into the muscle every 14 (fourteen) days.   Marland Kitchen triamcinolone cream (KENALOG) 0.1 % Apply 1 application topically as needed (skin irritation).   . [DISCONTINUED] amiodarone (PACERONE) 200 MG tablet Take 400 mg, 2 tablets, twice a day for 2 weeks, then 400 mg, 2 tablets, daily for 2 weeks, then 200 mg, 1 tablet, daily. All by mouth.  . [DISCONTINUED] amiodarone (PACERONE) 200 MG tablet Take 200 mg by mouth 2 (two) times daily.  . [DISCONTINUED] atorvastatin (LIPITOR) 40 MG tablet Take 40 mg by mouth every evening.   . [DISCONTINUED] Cholecalciferol (VITAMIN D PO) Take 1 tablet by mouth daily.  . [DISCONTINUED] Cyanocobalamin (VITAMIN B-12 PO) Take 500 mcg by mouth daily.   . [DISCONTINUED] metoprolol succinate (TOPROL-XL) 25 MG 24 hr tablet Take 25 mg by mouth daily.      Allergies:   Patient has no known allergies.   Social History   Tobacco Use  . Smoking status: Former Smoker    Packs/day: 3.00    Years: 20.00    Pack years: 60.00    Last attempt to quit: 09/19/1988    Years since quitting: 30.0  . Smokeless tobacco: Never Used  Substance Use Topics  . Alcohol use: Yes    Alcohol/week: 14.0 standard drinks    Types: 14 Shots of liquor per week  . Drug use: No     Family Hx: The patient's family history includes Cancer in his father; Heart attack in his mother.  ROS:   Please see the history of present illness.    Review of Systems  Respiratory: Positive for shortness of breath.   Gastrointestinal: Positive for diarrhea.   All other systems reviewed and are negative.   EKGs/Labs/Other Test Reviewed:    EKG:  EKG is  ordered today.  The ekg ordered today demonstrates AFlutter, variable AV block, HR 45, LAD, septal Q waves, QTc 389  Recent Labs: 03/06/2018: ALT 17 10/16/2018: BUN 27; Creatinine, Ser 1.64; Hemoglobin 15.1; Platelets 178; Potassium 5.0; Sodium 134   Recent Lipid Panel No results found for: CHOL, TRIG, HDL, CHOLHDL, LDLCALC, LDLDIRECT  Physical  Exam:    VS:  BP (!) 102/58   Pulse (!) 45   Ht 6\' 4"  (1.93 m)   Wt (!) 305 lb 6.4 oz (138.5 kg)   SpO2 97%   BMI 37.17 kg/m     Wt Readings from Last 3 Encounters:  10/16/18 (!) 305 lb 6.4 oz (138.5 kg)  09/14/18 (!) 310 lb 1.9 oz (140.7 kg)  09/05/18 (!) 305 lb 12.8 oz (138.7 kg)     Physical Exam  Constitutional: He is oriented to person, place, and time. He appears well-developed and well-nourished. No distress.  HENT:  Head: Normocephalic and atraumatic.  Eyes: No scleral icterus.  Neck: Neck supple. No thyromegaly present.  Cardiovascular: Normal rate, S1 normal and S2 normal. An irregularly irregular rhythm present.  No murmur heard. Pulmonary/Chest: Breath sounds normal. He has no rales.  Abdominal: Soft.  There is no hepatomegaly.  Musculoskeletal:        General: No edema.  Lymphadenopathy:    He has no cervical adenopathy.  Neurological: He is alert and oriented to person, place, and time.  Skin: Skin is warm and dry.  Psychiatric: He has a normal mood and affect.  Nursing note reviewed.   ASSESSMENT & PLAN:    Atypical atrial flutter ([place]) He remains in AFlutter.  HR is now in the 40s.  He overall feels well.  I recommend he proceed with DCCV.  He did miss Apixaban x 1 dose but that was 3 weeks ago.  He should be ok to proceed with DCCV this week or next.  He will need a BMET, CBC today. Decrease Amiodarone to 200 mg QD and decrease Toprol XL to 12.5 mg QD.  Risks and benefits of DCCV were reviewed with the patient and he agrees to proceed.  I reviewed his case with Dr. Marlou Porch today who also agreed with proceeding with DCCV.    -Arrange DCCV this week or next  -Decrease Amiodarone to 200 mg QD  -Decrease Toprol XL to 12.5 mg QD  -FU 2 weeks post DCCV  Coronary artery disease involving native coronary artery of native heart with angina pectoris (HCC) Severe disease noted in the LAD, Dx and LCx on cath in 2014.  He has been treated medically since then.  He  denies angina.  He is not on ASA as he is on Apixaban.  Continue beta-blocker, statin.  Dilated cardiomyopathy ([place]) Likely related to atrial flutter/fibrillation.  EF post DCCV on his TEE was improved.  Consider repeating Echo once he is back in NSR.  Continue beta-blocker, ACE inhibitor.    Essential hypertension The patient's blood pressure is controlled on his current regimen.  Continue current therapy.    Abnormal PFTs (pulmonary function tests) Probably related to prior smoking hx.  Referral to Pulmonology pending.    Hematuria This was painless and has not recurred. Obtain CBC today.  Refer back to Urology for evaluation.   Dispo:  Return in about 2 weeks (around 10/30/2018) for Post Procedure Follow Up w/ Dr. Marlou Porch, or Richardson Dopp, PA-C.   Medication Adjustments/Labs and Tests Ordered: Current medicines are reviewed at length with the patient today.  Concerns regarding medicines are outlined above.  Tests Ordered: Orders Placed This Encounter  Procedures  . Basic metabolic panel  . CBC  . EKG 12-Lead   Medication Changes: Meds ordered this encounter  Medications  . amiodarone (PACERONE) 200 MG tablet    Sig: Take 1 tablet (200 mg total) by mouth daily.    Dispense:  90 tablet    Refill:  3  . metoprolol succinate (TOPROL XL) 25 MG 24 hr tablet    Sig: Take 0.5 tablets (12.5 mg total) by mouth daily.    Dispense:  45 tablet    Refill:  3    Signed, Richardson Dopp, PA-C  10/16/2018 8:21 PM    [facility] [place], [place], [place]  [address] Phone: [phone]; Fax: [phone]

## 2018-10-18 ENCOUNTER — Telehealth: Payer: Self-pay | Admitting: *Deleted

## 2018-10-18 DIAGNOSIS — I10 Essential (primary) hypertension: Secondary | ICD-10-CM

## 2018-10-18 NOTE — Telephone Encounter (Signed)
-----   Message from Liliane Shi, [place] sent at 10/16/2018  5:45 PM EST ----- Creatinine increased since Dec 2019.  K+ normal. Hgb normal.  Recommendations:  - Stop taking Lisinopril/HCTZ for now  - I will forward to M. Supple, PharmD as well (new start on Jardiance several weeks ago)  - Repeat BMET 1 week  - Send copy to PCP Richardson Dopp, PA-C    10/16/2018 5:43 PM

## 2018-10-18 NOTE — Telephone Encounter (Signed)
Patient notified of result.  Please refer to phone note from today for complete details.   Julaine Hua, CMA 10/18/2018 1:51 PM   Pt agreeable to stop Lisinopril/HCTZ due to elevated kidney function. BMET 10/25/18. Pt advised to monitor BP and call the office if BP >130/80. Pt is agreeable to plan of care.

## 2018-10-22 ENCOUNTER — Encounter (HOSPITAL_COMMUNITY)
Admission: RE | Disposition: A | Discharge status: Home / Self Care | Payer: Self-pay | Source: Home / Self Care | Attending: Cardiology

## 2018-10-22 ENCOUNTER — Ambulatory Visit (HOSPITAL_COMMUNITY)
Admission: RE | Admit: 2018-10-22 | Discharge: 2018-10-22 | Disposition: A | Discharge status: Home / Self Care | Payer: Medicare Other | Attending: Cardiology | Admitting: Cardiology

## 2018-10-22 ENCOUNTER — Encounter (HOSPITAL_COMMUNITY): Payer: Self-pay | Admitting: Certified Registered Nurse Anesthetist

## 2018-10-22 ENCOUNTER — Other Ambulatory Visit: Payer: Self-pay

## 2018-10-22 ENCOUNTER — Ambulatory Visit (HOSPITAL_COMMUNITY): Payer: Medicare Other | Admitting: Certified Registered Nurse Anesthetist

## 2018-10-22 DIAGNOSIS — Z7901 Long term (current) use of anticoagulants: Secondary | ICD-10-CM | POA: Insufficient documentation

## 2018-10-22 DIAGNOSIS — E785 Hyperlipidemia, unspecified: Secondary | ICD-10-CM | POA: Insufficient documentation

## 2018-10-22 DIAGNOSIS — Z87891 Personal history of nicotine dependence: Secondary | ICD-10-CM | POA: Insufficient documentation

## 2018-10-22 DIAGNOSIS — R319 Hematuria, unspecified: Secondary | ICD-10-CM | POA: Insufficient documentation

## 2018-10-22 DIAGNOSIS — I25119 Atherosclerotic heart disease of native coronary artery with unspecified angina pectoris: Secondary | ICD-10-CM | POA: Diagnosis not present

## 2018-10-22 DIAGNOSIS — I484 Atypical atrial flutter: Secondary | ICD-10-CM

## 2018-10-22 DIAGNOSIS — E119 Type 2 diabetes mellitus without complications: Secondary | ICD-10-CM | POA: Diagnosis not present

## 2018-10-22 DIAGNOSIS — C9110 Chronic lymphocytic leukemia of B-cell type not having achieved remission: Secondary | ICD-10-CM | POA: Diagnosis not present

## 2018-10-22 DIAGNOSIS — Z7984 Long term (current) use of oral hypoglycemic drugs: Secondary | ICD-10-CM | POA: Insufficient documentation

## 2018-10-22 DIAGNOSIS — I10 Essential (primary) hypertension: Secondary | ICD-10-CM | POA: Insufficient documentation

## 2018-10-22 DIAGNOSIS — I420 Dilated cardiomyopathy: Secondary | ICD-10-CM | POA: Insufficient documentation

## 2018-10-22 DIAGNOSIS — I2510 Atherosclerotic heart disease of native coronary artery without angina pectoris: Secondary | ICD-10-CM | POA: Diagnosis not present

## 2018-10-22 DIAGNOSIS — Z8249 Family history of ischemic heart disease and other diseases of the circulatory system: Secondary | ICD-10-CM | POA: Diagnosis not present

## 2018-10-22 DIAGNOSIS — I4891 Unspecified atrial fibrillation: Secondary | ICD-10-CM | POA: Insufficient documentation

## 2018-10-22 HISTORY — PX: CARDIOVERSION: SHX1299

## 2018-10-22 LAB — GLUCOSE, CAPILLARY: Glucose-Capillary: 153 mg/dL — ABNORMAL HIGH (ref 70–99)

## 2018-10-22 SURGERY — CARDIOVERSION
Anesthesia: General

## 2018-10-22 MED ORDER — PROPOFOL 10 MG/ML IV BOLUS
INTRAVENOUS | Status: DC | PRN
  Administered 2018-10-22: 60 mg via INTRAVENOUS
  Administered 2018-10-22: 70 mg via INTRAVENOUS

## 2018-10-22 MED ORDER — APIXABAN 5 MG PO TABS
5.0000 mg | ORAL_TABLET | Freq: Once | ORAL | Status: AC
  Administered 2018-10-22: 5 mg via ORAL
  Filled 2018-10-22: qty 1

## 2018-10-22 MED ORDER — SODIUM CHLORIDE 0.9% FLUSH: 3.0000 mL | INTRAVENOUS | Status: DC | PRN

## 2018-10-22 MED ORDER — LIDOCAINE 2% (20 MG/ML) 5 ML SYRINGE
INTRAMUSCULAR | Status: DC | PRN
  Administered 2018-10-22: 80 mg via INTRAVENOUS

## 2018-10-22 MED ORDER — SODIUM CHLORIDE 0.9% FLUSH: 3.0000 mL | Freq: Two times a day (BID) | INTRAVENOUS | Status: DC

## 2018-10-22 MED ORDER — SODIUM CHLORIDE 0.9 % IV SOLN
250.0000 mL | INTRAVENOUS | Status: DC
  Administered 2018-10-22: 500 mL via INTRAVENOUS

## 2018-10-22 NOTE — Anesthesia Preprocedure Evaluation (Addendum)
Anesthesia Evaluation  Patient identified by MRN, date of birth, ID band Patient awake    Reviewed: Allergy & Precautions, NPO status , Patient's Chart, lab work & pertinent test results, reviewed documented beta blocker date and time   History of Anesthesia Complications Negative for: history of anesthetic complications  Airway Mallampati: II  TM Distance: >3 FB Neck ROM: Full    Dental  (+) Upper Dentures   Pulmonary former smoker,    breath sounds clear to auscultation       Cardiovascular hypertension, Pt. on medications and Pt. on home beta blockers + CAD and +CHF  + dysrhythmias Atrial Fibrillation  Rhythm:Irregular Rate:Tachycardia   '19 TEE - EF 40% to 45%. Hypokinesis of the anteroseptal myocardium. Mild MR. LA was mildly dilated. There was increased thickness of the septum, consistent with lipomatous hypertrophy.     Neuro/Psych negative neurological ROS  negative psych ROS   GI/Hepatic Neg liver ROS, GERD  Controlled,  Endo/Other  diabetes, Type 2, Oral Hypoglycemic Agents Obesity   Renal/GU Renal InsufficiencyRenal disease     Musculoskeletal negative musculoskeletal ROS (+)   Abdominal   Peds  Hematology  (+) Blood dyscrasia, anemia ,  CLL    Anesthesia Other Findings   Reproductive/Obstetrics                            Anesthesia Physical Anesthesia Plan  ASA: III  Anesthesia Plan: General   Post-op Pain Management:    Induction: Intravenous  PONV Risk Score and Plan: 2 and Treatment may vary due to age or medical condition and Propofol infusion  Airway Management Planned: Mask and Natural Airway  Additional Equipment: None  Intra-op Plan:   Post-operative Plan:   Informed Consent: I have reviewed the patients History and Physical, chart, labs and discussed the procedure including the risks, benefits and alternatives for the proposed anesthesia with the  patient or authorized representative who has indicated his/her understanding and acceptance.       Plan Discussed with: CRNA and Anesthesiologist  Anesthesia Plan Comments:        Anesthesia Quick Evaluation

## 2018-10-22 NOTE — Anesthesia Postprocedure Evaluation (Signed)
Anesthesia Post Note  Patient: Derek [name]  Procedure(s) Performed: CARDIOVERSION (N/A )     Patient location during evaluation: PACU Anesthesia Type: General Level of consciousness: awake and alert Pain management: pain level controlled Vital Signs Assessment: post-procedure vital signs reviewed and stable Respiratory status: spontaneous breathing, nonlabored ventilation and respiratory function stable Cardiovascular status: blood pressure returned to baseline and stable Postop Assessment: no apparent nausea or vomiting Anesthetic complications: no    Last Vitals:  Vitals:   10/22/18 1142 10/22/18 1152  BP: 136/75 (!) 114/58  Pulse: 65 64  Resp: (!) 28 18  Temp: 36.7 C   SpO2: 96% 96%    Last Pain:  Vitals:   10/22/18 1152  TempSrc:   PainSc: 0-No pain                 [name]

## 2018-10-22 NOTE — Interval H&P Note (Signed)
History and Physical Interval Note:  10/22/2018 11:19 AM  Derek [name]  has presented today for surgery, with the diagnosis of A-FLUTTER  The various methods of treatment have been discussed with the patient and family. After consideration of risks, benefits and other options for treatment, the patient has consented to  Procedure(s): CARDIOVERSION (N/A) as a surgical intervention .  The patient's history has been reviewed, patient examined, no change in status, stable for surgery.  I have reviewed the patient's chart and labs.  Questions were answered to the patient's satisfaction.     [employer]

## 2018-10-22 NOTE — Anesthesia Procedure Notes (Signed)
Procedure Name: General with mask airway Date/Time: 10/22/2018 11:29 AM Performed by: Alain Marion, CRNA Pre-anesthesia Checklist: Emergency Drugs available, Patient identified, Suction available, Patient being monitored and Timeout performed Patient Re-evaluated:Patient Re-evaluated prior to induction Oxygen Delivery Method: Ambu bag Preoxygenation: Pre-oxygenation with 100% oxygen Induction Type: IV induction Ventilation: Mask ventilation without difficulty Placement Confirmation: positive ETCO2 and breath sounds checked- equal and bilateral Dental Injury: Teeth and Oropharynx as per pre-operative assessment

## 2018-10-22 NOTE — Transfer of Care (Signed)
Immediate Anesthesia Transfer of Care Note  Patient: Derek [name]  Procedure(s) Performed: CARDIOVERSION (N/A )  Patient Location: Endoscopy Unit  Anesthesia Type:General  Level of Consciousness: awake, alert  and oriented  Airway & Oxygen Therapy: Patient Spontanous Breathing  Post-op Assessment: Report given to RN and Post -op Vital signs reviewed and stable  Post vital signs: Reviewed and stable  Last Vitals:  Vitals Value Taken Time  BP 140/70 10/22/2018 11:36 AM  Temp    Pulse 63 10/22/2018 11:37 AM  Resp 21 10/22/2018 11:37 AM  SpO2 97 % 10/22/2018 11:37 AM    Last Pain:  Vitals:   10/22/18 1040  TempSrc: Oral  PainSc: 0-No pain         Complications: No apparent anesthesia complications

## 2018-10-22 NOTE — Discharge Instructions (Signed)
Electrical Cardioversion, Care After °This sheet gives you information about how to care for yourself after your procedure. Your health care provider may also give you more specific instructions. If you have problems or questions, contact your health care provider. °What can I expect after the procedure? °After the procedure, it is common to have: °· Some redness on the skin where the shocks were given. °Follow these instructions at home: ° °· Do not drive for 24 hours if you were given a medicine to help you relax (sedative). °· Take over-the-counter and prescription medicines only as told by your health care provider. °· Ask your health care provider how to check your pulse. Check it often. °· Rest for 48 hours after the procedure or as told by your health care provider. °· Avoid or limit your caffeine use as told by your health care provider. °Contact a health care provider if: °· You feel like your heart is beating too quickly or your pulse is not regular. °· You have a serious muscle cramp that does not go away. °Get help right away if: ° °· You have discomfort in your chest. °· You are dizzy or you feel faint. °· You have trouble breathing or you are short of breath. °· Your speech is slurred. °· You have trouble moving an arm or leg on one side of your body. °· Your fingers or toes turn cold or blue. °This information is not intended to replace advice given to you by your health care provider. Make sure you discuss any questions you have with your health care provider. °Document Released: 06/26/2013 Document Revised: 04/08/2016 Document Reviewed: 03/11/2016 °Elsevier Interactive Patient Education © 2019 [employer] ° °

## 2018-10-22 NOTE — CV Procedure (Signed)
    Electrical Cardioversion Procedure Note Derek [name] [id number] Feb 26, 1941  Procedure: Electrical Cardioversion Indications:  Atrial Fibrillation  Time Out: Verified patient identification, verified procedure,medications/allergies/relevent history reviewed, required imaging and test results available.  Performed  Procedure Details  The patient was NPO after midnight. Anesthesia was administered at the beside  by Dr. Fransisco Beau with 120mg  of propofol.  Cardioversion was performed with synchronized biphasic defibrillation via AP pads with 120 joules.  1 attempt(s) were performed.  The patient converted to normal sinus rhythm. The patient tolerated the procedure well   IMPRESSION:  Successful cardioversion of atrial fibrillation    [name] 10/22/2018, 11:33 AM

## 2018-10-23 ENCOUNTER — Encounter (HOSPITAL_COMMUNITY): Payer: Self-pay | Admitting: Cardiology

## 2018-10-25 ENCOUNTER — Other Ambulatory Visit: Payer: Medicare Other

## 2018-10-25 DIAGNOSIS — I10 Essential (primary) hypertension: Secondary | ICD-10-CM

## 2018-10-25 LAB — BASIC METABOLIC PANEL
BUN/Creatinine Ratio: 12 (ref 10–24)
BUN: 16 mg/dL (ref 8–27)
CO2: 20 mmol/L (ref 20–29)
Calcium: 9.6 mg/dL (ref 8.6–10.2)
Chloride: 100 mmol/L (ref 96–106)
Creatinine, Ser: 1.32 mg/dL — ABNORMAL HIGH (ref 0.76–1.27)
GFR calc Af Amer: 60 mL/min/{1.73_m2} (ref >59)
GFR calc non Af Amer: 52 mL/min/{1.73_m2} — ABNORMAL LOW (ref >59)
Glucose: 171 mg/dL — ABNORMAL HIGH (ref 65–99)
Potassium: 4.5 mmol/L (ref 3.5–5.2)
Sodium: 138 mmol/L (ref 134–144)

## 2018-10-26 ENCOUNTER — Telehealth: Payer: Self-pay | Admitting: Physician Assistant

## 2018-10-26 DIAGNOSIS — N132 Hydronephrosis with renal and ureteral calculous obstruction: Secondary | ICD-10-CM | POA: Diagnosis not present

## 2018-10-26 DIAGNOSIS — R310 Gross hematuria: Secondary | ICD-10-CM | POA: Diagnosis not present

## 2018-10-26 DIAGNOSIS — N5201 Erectile dysfunction due to arterial insufficiency: Secondary | ICD-10-CM | POA: Diagnosis not present

## 2018-10-26 NOTE — Telephone Encounter (Signed)
Reviewed results with patient who verbalized understanding. 

## 2018-10-26 NOTE — Telephone Encounter (Signed)
New Message   Patient is returning call in reference to lab results. Please call.

## 2018-11-05 ENCOUNTER — Institutional Professional Consult (permissible substitution): Payer: Medicare Other | Admitting: Internal Medicine

## 2018-11-09 ENCOUNTER — Encounter: Payer: Self-pay | Admitting: Cardiology

## 2018-11-09 ENCOUNTER — Encounter: Payer: Self-pay | Admitting: Internal Medicine

## 2018-11-09 ENCOUNTER — Ambulatory Visit (INDEPENDENT_AMBULATORY_CARE_PROVIDER_SITE_OTHER): Payer: Medicare Other | Admitting: Internal Medicine

## 2018-11-09 ENCOUNTER — Ambulatory Visit (INDEPENDENT_AMBULATORY_CARE_PROVIDER_SITE_OTHER)
Admission: RE | Admit: 2018-11-09 | Discharge: 2018-11-09 | Disposition: A | Discharge status: Home / Self Care | Payer: Medicare Other | Source: Ambulatory Visit | Attending: Internal Medicine | Admitting: Internal Medicine

## 2018-11-09 ENCOUNTER — Ambulatory Visit (INDEPENDENT_AMBULATORY_CARE_PROVIDER_SITE_OTHER): Payer: Medicare Other | Admitting: Cardiology

## 2018-11-09 VITALS — BP 124/60 | HR 54 | Ht 76.0 in | Wt 309.0 lb

## 2018-11-09 VITALS — BP 140/72 | HR 53 | Ht 76.0 in | Wt 308.4 lb

## 2018-11-09 DIAGNOSIS — I25119 Atherosclerotic heart disease of native coronary artery with unspecified angina pectoris: Secondary | ICD-10-CM

## 2018-11-09 DIAGNOSIS — I484 Atypical atrial flutter: Secondary | ICD-10-CM

## 2018-11-09 DIAGNOSIS — R0600 Dyspnea, unspecified: Secondary | ICD-10-CM | POA: Insufficient documentation

## 2018-11-09 DIAGNOSIS — I10 Essential (primary) hypertension: Secondary | ICD-10-CM | POA: Diagnosis not present

## 2018-11-09 DIAGNOSIS — R0609 Other forms of dyspnea: Secondary | ICD-10-CM

## 2018-11-09 NOTE — Progress Notes (Signed)
Derek [name], male    DOB: 23-Jul-1941,    MRN: [id number]   Brief patient profile:  51 yowm always overweight and weighed [place] when quit smoking, not limited but started on amidarone 09/14/18 for AF with abn pfts 09/20/18 so referred to pulmonary clinic 11/09/2018 by Richardson Dopp.     History of Present Illness  11/09/2018  Pulmonary/ 1st office eval/Adriona Kaney  Chief Complaint  Patient presents with  . Pulmonary Consult    Referred by Richardson Dopp, PA for eval of abnormal PFT- pt on Amiodarone x 2 months. Pt denies any respiratory co's.   Dyspnea:  MMRC2 = can't walk a nl pace on a flat grade s sob but does fine slow and flat  Cough: none Sleep:  On side/back one pillows flat bed  SABA use: no inhalers    No obvious day to day or daytime variability or assoc excess/ purulent sputum or mucus plugs or hemoptysis or cp or chest tightness, subjective wheeze or overt sinus or hb symptoms.   Sleeping as above  without nocturnal  or early am exacerbation  of respiratory  c/o's or need for noct saba. Also denies any obvious fluctuation of symptoms with weather or environmental changes or other aggravating or alleviating factors except as outlined above   No unusual exposure hx or h/o childhood pna/ asthma or knowledge of premature birth.  Current Allergies, Complete Past Medical History, Past Surgical History, Family History, and Social History were reviewed in [employer] record.  ROS  The following are not active complaints unless bolded Hoarseness, sore throat, dysphagia, dental problems, itching, sneezing,  nasal congestion or discharge of excess mucus or purulent secretions, ear ache,   fever, chills, sweats, unintended wt loss or wt gain, classically pleuritic or exertional cp,  orthopnea pnd or arm/hand swelling  or leg swelling, presyncope, palpitations, abdominal pain, anorexia, nausea, vomiting, diarrhea  or change in bowel habits or change in bladder habits, change  in stools or change in urine, dysuria, hematuria,  rash, arthralgias, visual complaints, headache, numbness, weakness or ataxia or problems with walking or coordination,  change in mood or  memory.             Past Medical History:  Diagnosis Date  . Blood dyscrasia    cll remission  . 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 Diabetes mellitus, type II ([place])   . Dilated cardiomyopathy ([place]) 09/14/2018   AFlutter >> EF 40-45 pre DCCV and 45-50 post DCCV in 08/2018 // probable tachycardia mediated.  Marland Kitchen GERD (gastroesophageal reflux disease)    occ tums  . H/O cardiac catheterization   . H/O exercise stress test 1999, 2002  . Hypertension   . Hypertriglyceridemia   . Lymphocytosis   . Obesity   . Pulmonary Function Test 09/2018   PFTs 09/2018:  FEV1 88% predicted; FEV1/FVC 78%; DLCO cor 58    Outpatient Medications Prior to Visit  Medication Sig Dispense Refill  . amiodarone (PACERONE) 200 MG tablet Take 1 tablet (200 mg total) by mouth daily. 90 tablet 3  . apixaban (ELIQUIS) 5 MG TABS tablet Take 1 tablet (5 mg total) by mouth 2 (two) times daily. 60 tablet 11  . Cholecalciferol (VITAMIN D-3) 125 MCG (5000 UT) TABS Take 15,000 Units by mouth daily.    . hydrOXYzine (ATARAX/VISTARIL) 10 MG tablet Take 10 mg by mouth daily.    . metFORMIN (GLUCOPHAGE) 500 MG tablet Take 1 tablet (500 mg total) by  mouth 2 (two) times daily. 60 tablet 11  . metFORMIN (GLUCOPHAGE-XR) 500 MG 24 hr tablet Take 500 mg by mouth 2 (two) times daily.    . metoprolol succinate (TOPROL XL) 25 MG 24 hr tablet Take 0.5 tablets (12.5 mg total) by mouth daily. 45 tablet 3  . Omega-3 Fatty Acids (FISH OIL) 1200 MG CAPS Take 2,400 mg by mouth daily.     Marland Kitchen testosterone cypionate (DEPOTESTOSTERONE CYPIONATE) 200 MG/ML injection Inject 200 mg into the muscle every 14 (fourteen) days.     Marland Kitchen triamcinolone cream (KENALOG) 0.1 % Apply 1 application topically as needed (skin irritation).     . vitamin B-12  (CYANOCOBALAMIN) 500 MCG tablet Take 500 mcg by mouth daily.        Objective:     BP 124/60 (BP Location: Left Arm, Cuff Size: Normal)   Pulse (!) 54   Ht 6\' 4"  (1.93 m)   Wt (!) 309 lb (140.2 kg)   SpO2 95%   BMI 37.61 kg/m   SpO2: 95 % RA   Wt Readings from Last 3 Encounters:  11/09/18 (!) 309 lb (140.2 kg)  11/09/18 (!) 308 lb 6.4 oz (139.9 kg)  10/22/18 (!) 305 lb 6.4 oz (138.5 kg)    Pleasant amb obese wm nad  HEENT: Edentulous/ nl  turbinates bilaterally, and oropharynx. Nl external ear canals without cough reflex   NECK :  without JVD/Nodes/TM/ nl carotid upstrokes bilaterally   LUNGS: no acc muscle use,  Nl contour chest which is clear to A and P bilaterally without cough on insp or exp maneuvers   CV:  RRR  no s3 or murmur or increase in P2, and no edema   ABD:  soft and nontender with nl inspiratory excursion in the supine position. No bruits or organomegaly appreciated, bowel sounds nl  MS:  Nl gait/ ext warm without deformities, calf tenderness, cyanosis or clubbing No obvious joint restrictions   SKIN: warm and dry without lesions    NEURO:  alert, approp, nl sensorium with  no motor or cerebellar deficits apparent.     CXR PA and Lateral:   11/09/2018 :    I personally reviewed images and agree with radiology impression as follows:    No active cardiopulmonary disease.      Assessment   DOE (dyspnea on exertion) Stopped smoking 1990 @ wt 288  Amiodarone rx since  09/14/18 - PFT's  09/20/2018  FEV1 3.33 (90 % ) ratio 0.83  p 1 % improvement from saba p nothing prior to study with DLCO  59/58c % corrects to 74  % for alv volume   ERV 42  - 11/09/2018   Walked RA  2 laps @  approx 273ft each @ avg pace  stopped due to  End of study, sats still 96%    No evidence of amiodarone toxicity to date   Patients typically have been on amiodarone for 6-12 months before this complication manifests.  Of note, serial clinical evaluation for symptoms such as  cough dyspnea or fevers is  the preferred method of monitoring for pulmonary toxicity because a decrease in DLCO or lung volumes is a nonspecific for toxicity. Pathologically amiodarone pulmonary toxicity may appear as interstitial pneumonitis, eosinophilic pneumonia, organizing pneumonia, pulmonary fibrosis or less commonly as diffuse alveolar hemorrhage, pulmonary nodules or pleural effusions.  Risk factors for pulmonary toxicity include age greater than 51, daily dose greater than equal to 400 mg, a high cumulative dose, or pre-existing  lung disease, so he really has 1-2 risk factors (age and slt decrease in dlco) at this point but ok sats with walking so as long as he monitors this and stays active would:  >>>  continue amiodarone in as low a dose as tolerated and we will arrange to see him back here for 6 m comparison study, sooner if needed        Morbid obesity ([place]) complicated by aodm with low ERV  ERV 42% at pfts 09/20/2018   Body mass index is 37.61 kg/m.  -  trending up slowly  Lab Results  Component Value Date   TSH 2.016 04/24/2013     Contributing to gerd risk/ doe/reviewed the need and the process to achieve and maintain neg calorie balance > defer f/u primary care including intermittently monitoring thyroid status esp since now on amiodarone    Total time devoted to counseling  > 50 % of initial 60 min office visit:  review case with pt/wife and personally observed ambulatory 02 sat study/  discussion of options/alternatives/ personally creating written customized instructions  in presence of pt  then going over those specific  Instructions directly with the pt including how to use all of the meds but in particular covering each new medication in detail and the difference between the maintenance= "automatic" meds and the prns using an action plan format for the latter (If this problem/symptom => do that organization reading Left to right).  Please see AVS from this visit for  a full list of these instructions which I personally wrote for this pt and  are unique to this visit.     Christinia Gully, MD 11/09/2018

## 2018-11-09 NOTE — Assessment & Plan Note (Addendum)
Stopped smoking 1990 @ wt 288  Amiodarone rx since  09/14/18 - PFT's  09/20/2018  FEV1 3.33 (90 % ) ratio 0.83  p 1 % improvement from saba p nothing prior to study with DLCO  59/58c % corrects to 74  % for alv volume   ERV 42  - 11/09/2018   Walked RA  2 laps @  approx 22ft each @ avg pace  stopped due to  End of study, sats still 96%    No evidence of amiodarone toxicity to date   Patients typically have been on amiodarone for 6-12 months before this complication manifests.  Of note, serial clinical evaluation for symptoms such as cough dyspnea or fevers is  the preferred method of monitoring for pulmonary toxicity because a decrease in DLCO or lung volumes is a nonspecific for toxicity. Pathologically amiodarone pulmonary toxicity may appear as interstitial pneumonitis, eosinophilic pneumonia, organizing pneumonia, pulmonary fibrosis or less commonly as diffuse alveolar hemorrhage, pulmonary nodules or pleural effusions.  Risk factors for pulmonary toxicity include age greater than 30, daily dose greater than equal to 400 mg, a high cumulative dose, or pre-existing lung disease, so he really has 1-2 risk factors (age and slt decrease in dlco) at this point but ok sats with walking so as long as he monitors this and stays active would:  >>>  continue amiodarone in as low a dose as tolerated and we will arrange to see him back here for 6 m comparison study, sooner if needed

## 2018-11-09 NOTE — Progress Notes (Signed)
Spoke with pt and notified of results per Dr. Wert. Pt verbalized understanding and denied any questions. 

## 2018-11-09 NOTE — Patient Instructions (Signed)
To get the most out of exercise, you need to be continuously aware that you are short of breath, but never out of breath, for 30 minutes daily. As you improve, it will actually be easier for you to do the same amount of exercise  in  30 minutes so always push to the level where you are short of breath.    Monitor your 02 saturations every few weeks to get an idea where they are trending and if downward call right away for follow up  If not downward trend then due for repeat PFTs first week in July 2020 with office visit same day    Please remember to go to the  x-ray department  for your tests - we will call you with the results when they are available

## 2018-11-09 NOTE — Assessment & Plan Note (Addendum)
ERV 42% at pfts 09/20/2018   Body mass index is 37.61 kg/m.  -  trending up slowly  Lab Results  Component Value Date   TSH 2.016 04/24/2013     Contributing to gerd risk/ doe/reviewed the need and the process to achieve and maintain neg calorie balance > defer f/u primary care including intermittently monitoring thyroid status esp since now on amiodarone    Total time devoted to counseling  > 50 % of initial 60 min office visit:  review case with pt/wife and personally observed ambulatory 02 sat study/  discussion of options/alternatives/ personally creating written customized instructions  in presence of pt  then going over those specific  Instructions directly with the pt including how to use all of the meds but in particular covering each new medication in detail and the difference between the maintenance= "automatic" meds and the prns using an action plan format for the latter (If this problem/symptom => do that organization reading Left to right).  Please see AVS from this visit for a full list of these instructions which I personally wrote for this pt and  are unique to this visit.     Marland Kitchen

## 2018-11-09 NOTE — Patient Instructions (Signed)
Medication Instructions:  The current medical regimen is effective;  continue present plan and medications.  If you need a refill on your cardiac medications before your next appointment, please call your pharmacy.   Follow-Up: At [facility], you and your health needs are our priority.  As part of our continuing mission to provide you with exceptional heart care, we have created designated Provider Care Teams.  These Care Teams include your primary Cardiologist (physician) and Advanced Practice Providers (APPs -  Physician Assistants and Nurse Practitioners) who all work together to provide you with the care you need, when you need it. You will need a follow up appointment in 6 months.  Please call our office 2 months in advance to schedule this appointment.  You may see Mark Skains, MD or one of the following Advanced Practice Providers on your designated Care Team:   Lori Gerhardt, NP Laura Ingold, NP . Jill McDaniel, NP  Thank you for choosing [facility]!!       

## 2018-11-09 NOTE — Progress Notes (Signed)
Cardiology Office Note:    Date:  11/09/2018   ID:  [name], [place] 1940/12/15, MRN [id number]  PCP:  Wenda Low, MD  Cardiologist:  [name], MD  Electrophysiologist:  None   Referring MD: Wenda Low, MD     History of Present Illness:    Derek [name] is a 78 y.o. male here for the follow-up of paroxysmal atrial fibrillation.  Status post cardioversion on 10/22/2018.  He is maintaining sinus bradycardia with first-degree AV block, borderline left bundle branch block present.  He also has diabetes with hypertension coronary artery disease CLL hyperlipidemia.  Prior cardiac catheterization demonstrated an 85% and 95% stenosis after a large diagonal branch and dual 85% stenosis in the diagonal branch and occluded second obtuse marginal.  Medical management ensued.  Atrial flutter was new to him in December 2019.  Eliquis was used and TEE cardioversion took place.  EF 40 to 45%.  He was back in atrial fibrillation with rapid ventricular rate following the original cardioversion and he was placed on amiodarone at that time to see if we can maintain sinus rhythm.  Baseline PFTs did demonstrate reduced DLCO and pulmonary referral has taken place.  He has a drug rash from Lipitor according to his dermatologist.  He has stopped this.  Still having quite a bit of fatigue.  No syncope, no bleeding, no orthopnea, no PND.  Past Medical History:  Diagnosis Date  . Blood dyscrasia    cll remission  . 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 Diabetes mellitus, type II ([place])   . Dilated cardiomyopathy ([place]) 09/14/2018   AFlutter >> EF 40-45 pre DCCV and 45-50 post DCCV in 08/2018 // probable tachycardia mediated.  Marland Kitchen GERD (gastroesophageal reflux disease)    occ tums  . H/O cardiac catheterization   . H/O exercise stress test 1999, 2002  . Hypertension   . Hypertriglyceridemia   . Lymphocytosis   . Obesity   . Pulmonary Function Test 09/2018   PFTs 09/2018:  FEV1  88% predicted; FEV1/FVC 78%; DLCO cor 58    Past Surgical History:  Procedure Laterality Date  . CARDIOVERSION N/A 09/05/2018   Procedure: CARDIOVERSION;  Surgeon: Jerline Pain, MD;  Location: [facility];  Service: Cardiovascular;  Laterality: N/A;  . CARDIOVERSION N/A 10/22/2018   Procedure: CARDIOVERSION;  Surgeon: Jerline Pain, MD;  Location[facility] ENDOSCOPY;  Service: Cardiovascular;  Laterality: N/A;  . CHOLECYSTECTOMY N/A 07/12/2016   Procedure: LAPAROSCOPIC CHOLECYSTECTOMY;  Surgeon: Donnie Mesa, MD;  Location: [facility];  Service: General;  Laterality: N/A;  . FOOT SURGERY Right   . NASAL SINUS SURGERY    . TEE WITHOUT CARDIOVERSION N/A 09/05/2018   Procedure: TRANSESOPHAGEAL ECHOCARDIOGRAM (TEE);  Surgeon: Jerline Pain, MD;  Location: [facility] ENDOSCOPY;  Service: Cardiovascular;  Laterality: N/A;  . TONSILLECTOMY    . UMBILICAL HERNIA REPAIR N/A 07/12/2016   Procedure: [place];  Surgeon: Donnie Mesa, MD;  Location: [facility];  Service: General;  Laterality: N/A;  . vericose vein stripping      Current Medications: Current Meds  Medication Sig  . amiodarone (PACERONE) 200 MG tablet Take 1 tablet (200 mg total) by mouth daily.  Marland Kitchen apixaban (ELIQUIS) 5 MG TABS tablet Take 1 tablet (5 mg total) by mouth 2 (two) times daily.  . Cholecalciferol (VITAMIN D-3) 125 MCG (5000 UT) TABS Take 15,000 Units by mouth daily.  . hydrOXYzine (ATARAX/VISTARIL) 10 MG tablet Take 10 mg by mouth daily.  Marland Kitchen  metFORMIN (GLUCOPHAGE) 500 MG tablet Take 1 tablet (500 mg total) by mouth 2 (two) times daily.  . metFORMIN (GLUCOPHAGE-XR) 500 MG 24 hr tablet Take 500 mg by mouth 2 (two) times daily.  . metoprolol succinate (TOPROL XL) 25 MG 24 hr tablet Take 0.5 tablets (12.5 mg total) by mouth daily.  . Omega-3 Fatty Acids (FISH OIL) 1200 MG CAPS Take 2,400 mg by mouth daily.   Marland Kitchen testosterone cypionate (DEPOTESTOSTERONE CYPIONATE) 200 MG/ML injection Inject 200 mg into the muscle every 14 (fourteen)  days.   Marland Kitchen triamcinolone cream (KENALOG) 0.1 % Apply 1 application topically as needed (skin irritation).   . vitamin B-12 (CYANOCOBALAMIN) 500 MCG tablet Take 500 mcg by mouth daily.     Allergies:   Lipitor [atorvastatin]   Social History   Socioeconomic History  . Marital status: Married    Spouse name: Not on file  . Number of children: 3  . Years of education: Not on file  . Highest education level: Not on file  Occupational History    Employer: [employer]    Comment: retied Audiological scientist; asphalt work   Social Needs  . Financial resource strain: Not on file  . Food insecurity:    Worry: Not on file    Inability: Not on file  . Transportation needs:    Medical: Not on file    Non-medical: Not on file  Tobacco Use  . Smoking status: Former Smoker    Packs/day: 3.00    Years: 20.00    Pack years: 60.00    Last attempt to quit: 09/19/1988    Years since quitting: 30.1  . Smokeless tobacco: Never Used  Substance and Sexual Activity  . Alcohol use: Yes    Alcohol/week: 14.0 standard drinks    Types: 14 Shots of liquor per week  . Drug use: No  . Sexual activity: Not on file  Lifestyle  . Physical activity:    Days per week: Not on file    Minutes per session: Not on file  . Stress: Not on file  Relationships  . Social connections:    Talks on phone: Not on file    Gets together: Not on file    Attends religious service: Not on file    Active member of club or organization: Not on file    Attends meetings of clubs or organizations: Not on file    Relationship status: Not on file  Other Topics Concern  . Not on file  Social History Narrative  . Not on file     Family History: The patient's family history includes Cancer in his father; Heart attack in his mother.  ROS:   Please see the history of present illness.     All other systems reviewed and are negative.  EKGs/Labs/Other Studies Reviewed:    The following studies were reviewed today:  PFTs 09/20/2018    FEV1 88% predicted; FEV1/FVC 78%; DLCO cor 58  TEE/DCCV 09/05/18 EF 40-45, ant-sept HK, mild MR, no LAA clot, atrial septal lipomatous hypertrophy Post DCCV >> EF appeared to be improved to 45-50  Cardiac Catheterization5/1/14 LM ok LAD 85 then 95 immediately after large Dx branch; Dx with 85 x 2 in prox/mid vessel LCx irregs; OM2 mid 100 with L-L collats RCA irregs; PLB (small) prox 90 with R-R collats EF 55  Nuclear stress test2014 Mild ischemia in the basal inferolateral to mid inferolateral region. Moderate risk myocardial perfusion study.  Myoview 11/02 Normal, EF 56  EKG:  EKG is  ordered today.  The ekg ordered today demonstrates 53 1st degree AVB (263ms).   Recent Labs: 03/06/2018: ALT 17 10/16/2018: Hemoglobin 15.1; Platelets 178 10/25/2018: BUN 16; Creatinine, Ser 1.32; Potassium 4.5; Sodium 138  Recent Lipid Panel No results found for: CHOL, TRIG, HDL, CHOLHDL, VLDL, LDLCALC, LDLDIRECT  Physical Exam:    VS:  BP 140/72   Pulse (!) 53   Ht 6\' 4"  (1.93 m)   Wt (!) 308 lb 6.4 oz (139.9 kg)   SpO2 93%   BMI 37.54 kg/m     Wt Readings from Last 3 Encounters:  11/09/18 (!) 308 lb 6.4 oz (139.9 kg)  10/22/18 (!) 305 lb 6.4 oz (138.5 kg)  10/16/18 (!) 305 lb 6.4 oz (138.5 kg)     GEN:  Well nourished, well developed in no acute distress HEENT: Normal NECK: No JVD; No carotid bruits LYMPHATICS: No lymphadenopathy CARDIAC: RRR, no murmurs, rubs, gallops RESPIRATORY:  Clear to auscultation without rales, wheezing or rhonchi  ABDOMEN: Soft, non-tender, non-distended MUSCULOSKELETAL:  No edema; No deformity  SKIN: Skin rash, full body.  NEUROLOGIC:  Alert and oriented x 3 PSYCHIATRIC:  Normal affect   ASSESSMENT:    1. Atypical atrial flutter ([place])   2. Essential hypertension   3. Coronary artery disease involving native coronary artery of native heart with angina pectoris ([place])   4. Morbid obesity ([place])    PLAN:    In order of problems listed  above:  Rash, possible drug allergy - Saw the dermatologist.  They request that he stopped his Lipitor.  He has done this.  Ultimately, I would like for him to be on some type of statin therapy given his underlying coronary artery disease to help reduce risks of heart attack.  They understand this.  Given the rash component, I suggested he go through some trial and error with Dr. Lysle Rubens to try different statin formulations.  Coronary artery disease - Fairly significant disease noted in the LAD diagonal and circumflex in 2014.  Been treated medically.  Once again, would like for him to be on statin if possible.  Paroxysmal atrial flutter - Heart rate previously was in the 40s with atrial flutter.  Currently sinus bradycardia 53 while on amiodarone 200 mg a day and decreased dose of Toprol to 12.5 mg a day.  Wears a heart rate watch.  Overall fairly stable.  Fatigue/morbid obesity - This has not really gotten any better.  Encouraged daily exercise, weight loss.  Alcohol use - Drinks 3-4 alcoholic drinks in the evening afternoon.  Encouraged cessation given his atrial fibrillation and anticoagulation  Decreased DLCO on PFT - He is going to see pulmonary.  He was referred by Kathlen Mody, [place].  Prior hematuria -Resolved.  Medication Adjustments/Labs and Tests Ordered: Current medicines are reviewed at length with the patient today.  Concerns regarding medicines are outlined above.  Orders Placed This Encounter  Procedures  . EKG 12-Lead   No orders of the defined types were placed in this encounter.   Patient Instructions  Medication Instructions:  The current medical regimen is effective;  continue present plan and medications.  If you need a refill on your cardiac medications before your next appointment, please call your pharmacy.   Follow-Up: At [facility], you and your health needs are our priority.  As part of our continuing mission to provide you with exceptional heart care, we  have created designated Provider Care Teams.  These Care Teams include your primary  Cardiologist (physician) and Advanced Practice Providers (APPs -  Physician Assistants and Nurse Practitioners) who all work together to provide you with the care you need, when you need it. You will need a follow up appointment in 6 months.  Please call our office 2 months in advance to schedule this appointment.  You may see [name], MD or one of the following Advanced Practice Providers on your designated Care Team:   Truitt Merle, NP Cecilie Kicks, NP . Kathyrn Drown, NP  Thank you for choosing [facility]!!         Signed, [name], MD  11/09/2018 12:36 PM    [place]

## 2018-11-10 ENCOUNTER — Encounter: Payer: Self-pay | Admitting: Internal Medicine

## 2018-12-11 ENCOUNTER — Other Ambulatory Visit: Payer: Medicare Other

## 2018-12-18 ENCOUNTER — Telehealth: Payer: Self-pay

## 2018-12-18 NOTE — Telephone Encounter (Signed)
Called Mr. Foglesong for his three month follow up since starting Jardiance 10mg  daily. Unfortunately due to COVID-19 his lab and follow up appointment were rescheduled.   He reports he has not been taking Jardiance. When asked why, he says it was not intentional and to send it into the pharmacy and he will start taking it again. Based on fill history he received the medication on 09/14/18 and 10/15/18. There were 11 refills issued in December. [facility] will have a refill ready for Mr. Banet the next time he is in and he will continue on therapy. Counseled patient on possible side effects and encouraged patient to stay hydrated while taking an SGLT-2 inhibitor. Patient voiced understanding.   Lab follow up is scheduled for 02/13/19. Keep this appointment and evaluate BMET and HA1C at that time.    Isaias Sakai, Sherian Rein D PGY1 Pharmacy Resident  Phone [phone] 12/18/2018      2:41 PM

## 2019-01-17 DIAGNOSIS — E785 Hyperlipidemia, unspecified: Secondary | ICD-10-CM | POA: Diagnosis not present

## 2019-01-17 DIAGNOSIS — E1122 Type 2 diabetes mellitus with diabetic chronic kidney disease: Secondary | ICD-10-CM | POA: Diagnosis not present

## 2019-01-17 DIAGNOSIS — I10 Essential (primary) hypertension: Secondary | ICD-10-CM | POA: Diagnosis not present

## 2019-01-17 DIAGNOSIS — I2510 Atherosclerotic heart disease of native coronary artery without angina pectoris: Secondary | ICD-10-CM | POA: Diagnosis not present

## 2019-01-17 DIAGNOSIS — E119 Type 2 diabetes mellitus without complications: Secondary | ICD-10-CM | POA: Diagnosis not present

## 2019-01-17 DIAGNOSIS — N183 Chronic kidney disease, stage 3 (moderate): Secondary | ICD-10-CM | POA: Diagnosis not present

## 2019-01-17 DIAGNOSIS — Z7984 Long term (current) use of oral hypoglycemic drugs: Secondary | ICD-10-CM | POA: Diagnosis not present

## 2019-01-17 DIAGNOSIS — E782 Mixed hyperlipidemia: Secondary | ICD-10-CM | POA: Diagnosis not present

## 2019-01-31 DIAGNOSIS — C4442 Squamous cell carcinoma of skin of scalp and neck: Secondary | ICD-10-CM | POA: Diagnosis not present

## 2019-01-31 DIAGNOSIS — C4402 Squamous cell carcinoma of skin of lip: Secondary | ICD-10-CM | POA: Diagnosis not present

## 2019-01-31 DIAGNOSIS — D485 Neoplasm of uncertain behavior of skin: Secondary | ICD-10-CM | POA: Diagnosis not present

## 2019-01-31 DIAGNOSIS — L570 Actinic keratosis: Secondary | ICD-10-CM | POA: Diagnosis not present

## 2019-02-13 ENCOUNTER — Other Ambulatory Visit: Payer: Medicare Other

## 2019-02-13 DIAGNOSIS — K130 Diseases of lips: Secondary | ICD-10-CM | POA: Diagnosis not present

## 2019-02-13 DIAGNOSIS — D040 Carcinoma in situ of skin of lip: Secondary | ICD-10-CM | POA: Diagnosis not present

## 2019-02-13 DIAGNOSIS — C4442 Squamous cell carcinoma of skin of scalp and neck: Secondary | ICD-10-CM | POA: Diagnosis not present

## 2019-02-13 DIAGNOSIS — L570 Actinic keratosis: Secondary | ICD-10-CM | POA: Diagnosis not present

## 2019-03-07 ENCOUNTER — Other Ambulatory Visit: Payer: Self-pay

## 2019-03-07 ENCOUNTER — Inpatient Hospital Stay: Payer: Medicare Other | Attending: Hematology and Oncology

## 2019-03-07 ENCOUNTER — Inpatient Hospital Stay (HOSPITAL_BASED_OUTPATIENT_CLINIC_OR_DEPARTMENT_OTHER): Payer: Medicare Other | Admitting: Hematology and Oncology

## 2019-03-07 ENCOUNTER — Encounter: Payer: Self-pay | Admitting: Hematology and Oncology

## 2019-03-07 VITALS — BP 139/68 | HR 56 | Temp 98.5°F | Resp 18 | Ht 76.0 in | Wt 299.6 lb

## 2019-03-07 DIAGNOSIS — Z7901 Long term (current) use of anticoagulants: Secondary | ICD-10-CM | POA: Insufficient documentation

## 2019-03-07 DIAGNOSIS — Z7984 Long term (current) use of oral hypoglycemic drugs: Secondary | ICD-10-CM

## 2019-03-07 DIAGNOSIS — C9110 Chronic lymphocytic leukemia of B-cell type not having achieved remission: Secondary | ICD-10-CM

## 2019-03-07 DIAGNOSIS — Z79899 Other long term (current) drug therapy: Secondary | ICD-10-CM | POA: Insufficient documentation

## 2019-03-07 DIAGNOSIS — C4441 Basal cell carcinoma of skin of scalp and neck: Secondary | ICD-10-CM | POA: Insufficient documentation

## 2019-03-07 LAB — CBC WITH DIFFERENTIAL/PLATELET
Abs Immature Granulocytes: 0.03 10*3/uL (ref 0.00–0.07)
Basophils Absolute: 0 10*3/uL (ref 0.0–0.1)
Basophils Relative: 0 %
Eosinophils Absolute: 0.1 10*3/uL (ref 0.0–0.5)
Eosinophils Relative: 1 %
HCT: 43.5 % (ref 39.0–52.0)
Hemoglobin: 14.8 g/dL (ref 13.0–17.0)
Immature Granulocytes: 0 %
Lymphocytes Relative: 11 %
Lymphs Abs: 0.8 10*3/uL (ref 0.7–4.0)
MCH: 30.7 pg (ref 26.0–34.0)
MCHC: 34 g/dL (ref 30.0–36.0)
MCV: 90.2 fL (ref 80.0–100.0)
Monocytes Absolute: 0.9 10*3/uL (ref 0.1–1.0)
Monocytes Relative: 11 %
Neutro Abs: 5.8 10*3/uL (ref 1.7–7.7)
Neutrophils Relative %: 77 %
Platelets: 163 10*3/uL (ref 150–400)
RBC: 4.82 MIL/uL (ref 4.22–5.81)
RDW: 14.6 % (ref 11.5–15.5)
WBC: 7.7 10*3/uL (ref 4.0–10.5)
nRBC: 0 % (ref 0.0–0.2)

## 2019-03-07 LAB — COMPREHENSIVE METABOLIC PANEL
ALT: 12 U/L (ref 0–44)
AST: 12 U/L — ABNORMAL LOW (ref 15–41)
Albumin: 3.7 g/dL (ref 3.5–5.0)
Alkaline Phosphatase: 61 U/L (ref 38–126)
Anion gap: 11 (ref 5–15)
BUN: 18 mg/dL (ref 8–23)
CO2: 21 mmol/L — ABNORMAL LOW (ref 22–32)
Calcium: 9.3 mg/dL (ref 8.9–10.3)
Chloride: 104 mmol/L (ref 98–111)
Creatinine, Ser: 1.42 mg/dL — ABNORMAL HIGH (ref 0.61–1.24)
GFR calc Af Amer: 55 mL/min — ABNORMAL LOW (ref >60)
GFR calc non Af Amer: 48 mL/min — ABNORMAL LOW (ref >60)
Glucose, Bld: 166 mg/dL — ABNORMAL HIGH (ref 70–99)
Potassium: 4.3 mmol/L (ref 3.5–5.1)
Sodium: 136 mmol/L (ref 135–145)
Total Bilirubin: 0.9 mg/dL (ref 0.3–1.2)
Total Protein: 7 g/dL (ref 6.5–8.1)

## 2019-03-07 LAB — LACTATE DEHYDROGENASE: LDH: 124 U/L (ref 98–192)

## 2019-03-07 NOTE — Assessment & Plan Note (Signed)
He has no signs or symptoms of cancer recurrence I will see him back once a year for history, physical examination and blood work

## 2019-03-07 NOTE — Progress Notes (Signed)
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 CBC with Differential/Platelet    Standing Status:   Future    Standing Expiration Date:   04/10/2020    INTERVAL HISTORY: Please see below for problem oriented charting. He returns to follow-up on CLL He denies new lymphadenopathy No recent infection, fever or chills He is currently being treated with topical 5-FU by dermatologist for skin cancer  SUMMARY OF ONCOLOGIC HISTORY: Oncology History Overview Note  Normal FISH   CLL (chronic lymphocytic leukemia) ([place])  09/10/2013 Initial Diagnosis   CLL (chronic lymphocytic leukemia)   12/26/2013 Pathology Results   FISH analysis was normal.   06/17/2014 Procedure   The patient has placement of Infuse-a-Port.   06/18/2014 Imaging   CT scan of the chest, abdomen and pelvis showed diffuse lymphadenopathy and splenomegaly.   06/19/2014 Bone Marrow Biopsy   Bone marrow aspirate and biopsy show CLL.   06/24/2014 - 11/18/2014 Chemotherapy   He received 6 cycles of Obinutuzumab and chlorambucil   09/15/2014 Imaging   Repeat CT scan of the chest, abdomen and pelvis show greater than 50% reduction in lymphadenopathy and splenomegaly   12/19/2014 Imaging   Repeat CT scan showed complete resolution of lymphadenopathy and splenomegaly.     REVIEW OF SYSTEMS:    Constitutional: Denies fevers, chills or abnormal weight loss Eyes: Denies blurriness of vision Ears, nose, mouth, throat, and face: Denies mucositis or sore throat Respiratory: Denies cough, dyspnea or wheezes Cardiovascular: Denies palpitation, chest discomfort or lower extremity swelling Gastrointestinal:  Denies nausea, heartburn or change in bowel habits Skin: Denies abnormal skin rashes Lymphatics: Denies new lymphadenopathy or easy bruising Neurological:Denies numbness, tingling or new weaknesses Behavioral/Psych: Mood is stable, no new changes  All other systems were reviewed with the patient and are negative.  I have reviewed the past medical history, past surgical history, social history and family history with the patient and they are unchanged from previous note.  ALLERGIES:  is allergic to lipitor [atorvastatin].  MEDICATIONS:  Current Outpatient Medications  Medication Sig Dispense Refill  . amiodarone (PACERONE) 200 MG tablet Take 1 tablet (200 mg total) by mouth daily. 90 tablet 3  . apixaban (ELIQUIS) 5 MG TABS tablet Take 1 tablet (5 mg total) by mouth 2 (two) times daily. 60 tablet 11  . Cholecalciferol (VITAMIN D-3) 125 MCG (5000 UT) TABS Take 15,000 Units by mouth daily.    . empagliflozin (JARDIANCE) 10 MG TABS tablet Take 10 mg by mouth daily.    . hydrOXYzine (ATARAX/VISTARIL) 10 MG tablet Take 10 mg by mouth daily.    . metFORMIN (GLUCOPHAGE) 500 MG tablet Take 1 tablet (500 mg total) by mouth 2 (two) times daily. 60 tablet 11  . metoprolol succinate (TOPROL XL) 25 MG 24 hr tablet Take 0.5 tablets (12.5 mg total) by mouth daily. 45 tablet 3  . Omega-3 Fatty Acids (FISH  OIL) 1200 MG CAPS Take 2,400 mg by mouth daily.     Marland Kitchen testosterone cypionate (DEPOTESTOSTERONE CYPIONATE) 200 MG/ML injection Inject 200 mg into the muscle every 14 (fourteen) days.     Marland Kitchen triamcinolone cream (KENALOG) 0.1 % Apply 1 application topically as needed (skin irritation).     . vitamin  B-12 (CYANOCOBALAMIN) 500 MCG tablet Take 500 mcg by mouth daily.     No current facility-administered medications for this visit.     PHYSICAL EXAMINATION: ECOG PERFORMANCE STATUS: 0 - Asymptomatic  Vitals:   03/07/19 0857  BP: 139/68  Pulse: (!) 56  Resp: 18  Temp: 98.5 F (36.9 C)  SpO2: 98%   Filed Weights   03/07/19 0857  Weight: 299 lb 9.6 oz (135.9 kg)    GENERAL:alert, no distress and comfortable SKIN: Noted skin lesions on his scalp and his lip consistent with skin cancer EYES: normal, Conjunctiva are pink and non-injected, sclera clear OROPHARYNX:no exudate, no erythema and lips, buccal mucosa, and tongue normal  NECK: supple, thyroid normal size, non-tender, without nodularity LYMPH:  no palpable lymphadenopathy in the cervical, axillary or inguinal LUNGS: clear to auscultation and percussion with normal breathing effort HEART: regular rate & rhythm and no murmurs and no lower extremity edema ABDOMEN:abdomen soft, non-tender and normal bowel sounds Musculoskeletal:no cyanosis of digits and no clubbing  NEURO: alert & oriented x 3 with fluent speech, no focal motor/sensory deficits  LABORATORY DATA:  I have reviewed the data as listed    Component Value Date/Time   NA 138 10/25/2018 1120   NA 140 06/25/2015 0758   K 4.5 10/25/2018 1120   K 3.9 06/25/2015 0758   CL 100 10/25/2018 1120   CL 101 03/11/2013 1031   CO2 20 10/25/2018 1120   CO2 25 06/25/2015 0758   GLUCOSE 171 (H) 10/25/2018 1120   GLUCOSE 261 (H) 03/06/2018 0816   GLUCOSE 140 06/25/2015 0758   GLUCOSE 173 (H) 03/11/2013 1031   BUN 16 10/25/2018 1120   BUN 15.9 06/25/2015 0758   CREATININE 1.32 (H) 10/25/2018 1120   CREATININE 1.58 (H) 03/06/2018 0816   CREATININE 1.0 06/25/2015 0758   CALCIUM 9.6 10/25/2018 1120   CALCIUM 9.3 06/25/2015 0758   PROT 6.7 03/06/2018 0816   PROT 6.7 06/25/2015 0758   ALBUMIN 3.8 03/06/2018 0816   ALBUMIN 3.8 06/25/2015 0758   AST 12 03/06/2018 0816   AST  15 06/25/2015 0758   ALT 17 03/06/2018 0816   ALT 14 06/25/2015 0758   ALKPHOS 74 03/06/2018 0816   ALKPHOS 61 06/25/2015 0758   BILITOT 0.8 03/06/2018 0816   BILITOT 0.95 06/25/2015 0758   GFRNONAA 52 (L) 10/25/2018 1120   GFRNONAA 41 (L) 03/06/2018 0816   GFRAA 60 10/25/2018 1120   GFRAA 48 (L) 03/06/2018 0816    No results found for: SPEP, UPEP  Lab Results  Component Value Date   WBC 7.7 03/07/2019   NEUTROABS 5.8 03/07/2019   HGB 14.8 03/07/2019   HCT 43.5 03/07/2019   MCV 90.2 03/07/2019   PLT 163 03/07/2019      Chemistry      Component Value Date/Time   NA 138 10/25/2018 1120   NA 140 06/25/2015 0758   K 4.5 10/25/2018 1120   K 3.9 06/25/2015 0758   CL 100 10/25/2018 1120   CL 101 03/11/2013 1031   CO2 20 10/25/2018 1120   CO2 25 06/25/2015 0758   BUN 16 10/25/2018 1120   BUN 15.9 06/25/2015  0758   CREATININE 1.32 (H) 10/25/2018 1120   CREATININE 1.58 (H) 03/06/2018 0816   CREATININE 1.0 06/25/2015 0758      Component Value Date/Time   CALCIUM 9.6 10/25/2018 1120   CALCIUM 9.3 06/25/2015 0758   ALKPHOS 74 03/06/2018 0816   ALKPHOS 61 06/25/2015 0758   AST 12 03/06/2018 0816   AST 15 06/25/2015 0758   ALT 17 03/06/2018 0816   ALT 14 06/25/2015 0758   BILITOT 0.8 03/06/2018 0816   BILITOT 0.95 06/25/2015 0758      All questions were answered. The patient knows to call the clinic with any problems, questions or concerns. No barriers to learning was detected.  I spent 10 minutes counseling the patient face to face. The total time spent in the appointment was 15 minutes and more than 50% was on counseling and review of test results  [name], MD 03/07/2019 9:05 AM

## 2019-03-07 NOTE — Assessment & Plan Note (Signed)
He is currently receiving topical treatment through his dermatologist I will defer to them for further management We discussed the importance of avoidance of excessive sun exposure

## 2019-04-10 DIAGNOSIS — C4442 Squamous cell carcinoma of skin of scalp and neck: Secondary | ICD-10-CM | POA: Diagnosis not present

## 2019-04-10 DIAGNOSIS — L570 Actinic keratosis: Secondary | ICD-10-CM | POA: Diagnosis not present

## 2019-04-15 DIAGNOSIS — I10 Essential (primary) hypertension: Secondary | ICD-10-CM | POA: Diagnosis not present

## 2019-04-15 DIAGNOSIS — N183 Chronic kidney disease, stage 3 (moderate): Secondary | ICD-10-CM | POA: Diagnosis not present

## 2019-04-15 DIAGNOSIS — E785 Hyperlipidemia, unspecified: Secondary | ICD-10-CM | POA: Diagnosis not present

## 2019-04-15 DIAGNOSIS — E1122 Type 2 diabetes mellitus with diabetic chronic kidney disease: Secondary | ICD-10-CM | POA: Diagnosis not present

## 2019-04-15 DIAGNOSIS — Z7984 Long term (current) use of oral hypoglycemic drugs: Secondary | ICD-10-CM | POA: Diagnosis not present

## 2019-04-15 DIAGNOSIS — I2510 Atherosclerotic heart disease of native coronary artery without angina pectoris: Secondary | ICD-10-CM | POA: Diagnosis not present

## 2019-04-15 DIAGNOSIS — E782 Mixed hyperlipidemia: Secondary | ICD-10-CM | POA: Diagnosis not present

## 2019-04-15 DIAGNOSIS — E119 Type 2 diabetes mellitus without complications: Secondary | ICD-10-CM | POA: Diagnosis not present

## 2019-05-20 DIAGNOSIS — Z23 Encounter for immunization: Secondary | ICD-10-CM | POA: Diagnosis not present

## 2019-06-14 DIAGNOSIS — I872 Venous insufficiency (chronic) (peripheral): Secondary | ICD-10-CM | POA: Diagnosis not present

## 2019-06-14 DIAGNOSIS — E785 Hyperlipidemia, unspecified: Secondary | ICD-10-CM | POA: Diagnosis not present

## 2019-06-14 DIAGNOSIS — E349 Endocrine disorder, unspecified: Secondary | ICD-10-CM | POA: Diagnosis not present

## 2019-06-14 DIAGNOSIS — Z1389 Encounter for screening for other disorder: Secondary | ICD-10-CM | POA: Diagnosis not present

## 2019-06-14 DIAGNOSIS — I10 Essential (primary) hypertension: Secondary | ICD-10-CM | POA: Diagnosis not present

## 2019-06-14 DIAGNOSIS — N400 Enlarged prostate without lower urinary tract symptoms: Secondary | ICD-10-CM | POA: Diagnosis not present

## 2019-06-14 DIAGNOSIS — C9110 Chronic lymphocytic leukemia of B-cell type not having achieved remission: Secondary | ICD-10-CM | POA: Diagnosis not present

## 2019-06-14 DIAGNOSIS — I4892 Unspecified atrial flutter: Secondary | ICD-10-CM | POA: Diagnosis not present

## 2019-06-14 DIAGNOSIS — I2510 Atherosclerotic heart disease of native coronary artery without angina pectoris: Secondary | ICD-10-CM | POA: Diagnosis not present

## 2019-06-14 DIAGNOSIS — Z0000 Encounter for general adult medical examination without abnormal findings: Secondary | ICD-10-CM | POA: Diagnosis not present

## 2019-06-14 DIAGNOSIS — N183 Chronic kidney disease, stage 3 (moderate): Secondary | ICD-10-CM | POA: Diagnosis not present

## 2019-06-14 DIAGNOSIS — E1122 Type 2 diabetes mellitus with diabetic chronic kidney disease: Secondary | ICD-10-CM | POA: Diagnosis not present

## 2019-07-01 ENCOUNTER — Ambulatory Visit: Payer: Medicare Other | Admitting: Cardiology

## 2019-07-11 DIAGNOSIS — H5213 Myopia, bilateral: Secondary | ICD-10-CM | POA: Diagnosis not present

## 2019-07-11 DIAGNOSIS — Z7984 Long term (current) use of oral hypoglycemic drugs: Secondary | ICD-10-CM | POA: Diagnosis not present

## 2019-07-11 DIAGNOSIS — H25013 Cortical age-related cataract, bilateral: Secondary | ICD-10-CM | POA: Diagnosis not present

## 2019-07-11 DIAGNOSIS — H52202 Unspecified astigmatism, left eye: Secondary | ICD-10-CM | POA: Diagnosis not present

## 2019-07-11 DIAGNOSIS — H524 Presbyopia: Secondary | ICD-10-CM | POA: Diagnosis not present

## 2019-07-11 DIAGNOSIS — H2513 Age-related nuclear cataract, bilateral: Secondary | ICD-10-CM | POA: Diagnosis not present

## 2019-07-11 DIAGNOSIS — E119 Type 2 diabetes mellitus without complications: Secondary | ICD-10-CM | POA: Diagnosis not present

## 2019-07-17 DIAGNOSIS — E785 Hyperlipidemia, unspecified: Secondary | ICD-10-CM | POA: Diagnosis not present

## 2019-07-17 DIAGNOSIS — N182 Chronic kidney disease, stage 2 (mild): Secondary | ICD-10-CM | POA: Diagnosis not present

## 2019-07-17 DIAGNOSIS — E1122 Type 2 diabetes mellitus with diabetic chronic kidney disease: Secondary | ICD-10-CM | POA: Diagnosis not present

## 2019-07-17 DIAGNOSIS — E039 Hypothyroidism, unspecified: Secondary | ICD-10-CM | POA: Diagnosis not present

## 2019-07-17 DIAGNOSIS — I2510 Atherosclerotic heart disease of native coronary artery without angina pectoris: Secondary | ICD-10-CM | POA: Diagnosis not present

## 2019-07-17 DIAGNOSIS — N400 Enlarged prostate without lower urinary tract symptoms: Secondary | ICD-10-CM | POA: Diagnosis not present

## 2019-07-17 DIAGNOSIS — E119 Type 2 diabetes mellitus without complications: Secondary | ICD-10-CM | POA: Diagnosis not present

## 2019-07-17 DIAGNOSIS — E782 Mixed hyperlipidemia: Secondary | ICD-10-CM | POA: Diagnosis not present

## 2019-07-17 DIAGNOSIS — I10 Essential (primary) hypertension: Secondary | ICD-10-CM | POA: Diagnosis not present

## 2019-07-17 DIAGNOSIS — N1831 Chronic kidney disease, stage 3a: Secondary | ICD-10-CM | POA: Diagnosis not present

## 2019-07-22 ENCOUNTER — Encounter: Payer: Self-pay | Admitting: Cardiology

## 2019-07-22 ENCOUNTER — Ambulatory Visit (INDEPENDENT_AMBULATORY_CARE_PROVIDER_SITE_OTHER): Payer: Medicare Other | Admitting: Cardiology

## 2019-07-22 ENCOUNTER — Other Ambulatory Visit: Payer: Self-pay

## 2019-07-22 VITALS — BP 120/62 | HR 52 | Ht 76.0 in | Wt 302.2 lb

## 2019-07-22 DIAGNOSIS — I25119 Atherosclerotic heart disease of native coronary artery with unspecified angina pectoris: Secondary | ICD-10-CM | POA: Diagnosis not present

## 2019-07-22 DIAGNOSIS — I480 Paroxysmal atrial fibrillation: Secondary | ICD-10-CM | POA: Diagnosis not present

## 2019-07-22 DIAGNOSIS — E119 Type 2 diabetes mellitus without complications: Secondary | ICD-10-CM

## 2019-07-22 NOTE — Progress Notes (Signed)
Cardiology Office Note:    Date:  07/22/2019   ID:  Derek, [name] [birth year], MRN [id number]  PCP:  Wenda Low, MD  Cardiologist:  [name], MD  Electrophysiologist:  None   Referring MD: Wenda Low, MD     History of Present Illness:    Derek [name] is a 78 y.o. male here for the follow-up of paroxysmal atrial fibrillation.  Status post cardioversion on 10/22/2018.  He is maintaining sinus bradycardia with first-degree AV block, borderline left bundle branch block present.  He also has diabetes with hypertension coronary artery disease CLL hyperlipidemia.  Prior cardiac catheterization demonstrated an 85% and 95% stenosis after a large diagonal branch and dual 85% stenosis in the diagonal branch and occluded second obtuse marginal.  Medical management ensued.  Atrial flutter was new to him in December 2019.  Eliquis was used and TEE cardioversion took place.  EF 40 to 45%.  He was back in atrial fibrillation with rapid ventricular rate following the original cardioversion and he was placed on amiodarone at that time to see if we can maintain sinus rhythm.  Baseline PFTs did demonstrate reduced DLCO and pulmonary referral has taken place.  He has a drug rash from Lipitor according to his dermatologist.  He has stopped this.  07/22/2019-here for follow-up of atrial flutter.  Overall been doing quite well with the amiodarone as well as the Eliquis.  He does state that the price of the Eliquis is quite high for him $120 a month.  Still having fatigue, which he says is his own fault since he is not exercising.  No fevers chills nausea vomiting syncope bleeding.  He is still off of atorvastatin, not on any statin.  Past Medical History:  Diagnosis Date  . Blood dyscrasia    cll remission  . CLL (chronic lymphocytic leukemia) ([place]) 09/10/2013  . Diabetes mellitus, type II ([place])   . Dilated cardiomyopathy ([place]) 09/14/2018   AFlutter >> EF 40-45 pre DCCV and 45-50 post DCCV in  08/2018 // probable tachycardia mediated.  Marland Kitchen GERD (gastroesophageal reflux disease)    occ tums  . H/O cardiac catheterization   . H/O exercise stress test 1999, 2002  . Hypertension   . Hypertriglyceridemia   . Lymphocytosis   . Obesity   . Pulmonary Function Test 09/2018   PFTs 09/2018:  FEV1 88% predicted; FEV1/FVC 78%; DLCO cor 58    Past Surgical History:  Procedure Laterality Date  . CARDIOVERSION N/A 09/05/2018   Procedure: CARDIOVERSION;  Surgeon: Jerline Pain, MD;  Location: [facility];  Service: Cardiovascular;  Laterality: N/A;  . CARDIOVERSION N/A 10/22/2018   Procedure: CARDIOVERSION;  Surgeon: Jerline Pain, MD;  Location: [facility] ENDOSCOPY;  Service: Cardiovascular;  Laterality: N/A;  . CHOLECYSTECTOMY N/A 07/12/2016   Procedure: LAPAROSCOPIC CHOLECYSTECTOMY;  Surgeon: Donnie Mesa, MD;  Location: [facility];  Service: General;  Laterality: N/A;  . FOOT SURGERY Right   . NASAL SINUS SURGERY    . TEE WITHOUT CARDIOVERSION N/A 09/05/2018   Procedure: TRANSESOPHAGEAL ECHOCARDIOGRAM (TEE);  Surgeon: Jerline Pain, MD;  Location: [facility] ENDOSCOPY;  Service: Cardiovascular;  Laterality: N/A;  . TONSILLECTOMY    . UMBILICAL HERNIA REPAIR N/A 07/12/2016   Procedure: [place];  Surgeon: Donnie Mesa, MD;  Location: [place] [facility];  Service: General;  Laterality: N/A;  . vericose vein stripping      Current Medications: Current Meds  Medication Sig  . amiodarone (PACERONE) 200 MG tablet Take 1 tablet (  200 mg total) by mouth daily.  Marland Kitchen apixaban (ELIQUIS) 5 MG TABS tablet Take 1 tablet (5 mg total) by mouth 2 (two) times daily.  . Cholecalciferol (VITAMIN D-3) 125 MCG (5000 UT) TABS Take 15,000 Units by mouth daily.  . empagliflozin (JARDIANCE) 10 MG TABS tablet Take 10 mg by mouth daily.  . hydrOXYzine (ATARAX/VISTARIL) 10 MG tablet Take 10 mg by mouth daily.  Marland Kitchen levothyroxine (SYNTHROID) 25 MCG tablet Take 25 mcg by mouth daily.  . metFORMIN (GLUCOPHAGE) 500 MG tablet Take 1  tablet (500 mg total) by mouth 2 (two) times daily.  . metoprolol succinate (TOPROL XL) 25 MG 24 hr tablet Take 0.5 tablets (12.5 mg total) by mouth daily.  . Omega-3 Fatty Acids (FISH OIL) 1200 MG CAPS Take 2,400 mg by mouth daily.   Marland Kitchen testosterone cypionate (DEPOTESTOSTERONE CYPIONATE) 200 MG/ML injection Inject 200 mg into the muscle every 14 (fourteen) days.   Marland Kitchen triamcinolone cream (KENALOG) 0.1 % Apply 1 application topically as needed (skin irritation).   . vitamin B-12 (CYANOCOBALAMIN) 500 MCG tablet Take 500 mcg by mouth daily.     Allergies:   Lipitor [atorvastatin]   Social History   Socioeconomic History  . Marital status: Married    Spouse name: Not on file  . Number of children: 3  . Years of education: Not on file  . Highest education level: Not on file  Occupational History    Employer: [employer]    Comment: retied Audiological scientist; asphalt work   Social Needs  . Financial resource strain: Not on file  . Food insecurity    Worry: Not on file    Inability: Not on file  . Transportation needs    Medical: Not on file    Non-medical: Not on file  Tobacco Use  . Smoking status: Former Smoker    Packs/day: 3.00    Years: 10.00    Pack years: 30.00    Quit date: 09/19/1988    Years since quitting: 30.8  . Smokeless tobacco: Never Used  Substance and Sexual Activity  . Alcohol use: Yes    Alcohol/week: 14.0 standard drinks    Types: 14 Shots of liquor per week  . Drug use: No  . Sexual activity: Not on file  Lifestyle  . Physical activity    Days per week: Not on file    Minutes per session: Not on file  . Stress: Not on file  Relationships  . Social Herbalist on phone: Not on file    Gets together: Not on file    Attends religious service: Not on file    Active member of club or organization: Not on file    Attends meetings of clubs or organizations: Not on file    Relationship status: Not on file  Other Topics Concern  . Not on file  Social History  Narrative  . Not on file     Family History: The patient's family history includes Cancer in his father; Heart attack in his mother.  ROS:   Please see the history of present illness.     All other systems reviewed and are negative.  EKGs/Labs/Other Studies Reviewed:    The following studies were reviewed today:  PFTs 09/20/2018  FEV1 88% predicted; FEV1/FVC 78%; DLCO cor 58  TEE/DCCV 09/05/18 EF 40-45, ant-sept HK, mild MR, no LAA clot, atrial septal lipomatous hypertrophy Post DCCV >> EF appeared to be improved to 45-50  Cardiac Catheterization5/1/14 LM ok  LAD 85 then 95 immediately after large Dx branch; Dx with 85 x 2 in prox/mid vessel LCx irregs; OM2 mid 100 with L-L collats RCA irregs; PLB (small) prox 90 with R-R collats EF 55  Nuclear stress test2014 Mild ischemia in the basal inferolateral to mid inferolateral region. Moderate risk myocardial perfusion study.  Myoview 11/02 Normal, EF 56  EKG:  EKG is  ordered today.  The ekg ordered today demonstrates 53 1st degree AVB (274ms).   Recent Labs: 03/07/2019: ALT 12; BUN 18; Creatinine, Ser 1.42; Hemoglobin 14.8; Platelets 163; Potassium 4.3; Sodium 136  Recent Lipid Panel No results found for: CHOL, TRIG, HDL, CHOLHDL, VLDL, LDLCALC, LDLDIRECT  Physical Exam:    VS:  BP 120/62   Pulse (!) 52   Ht 6\' 4"  (1.93 m)   Wt (!) 302 lb 3.2 oz (137.1 kg)   SpO2 95%   BMI 36.78 kg/m     Wt Readings from Last 3 Encounters:  07/22/19 (!) 302 lb 3.2 oz (137.1 kg)  03/07/19 299 lb 9.6 oz (135.9 kg)  11/09/18 (!) 309 lb (140.2 kg)     GEN:  Well nourished, well developed in no acute distress HEENT: Normal NECK: No JVD; No carotid bruits LYMPHATICS: No lymphadenopathy CARDIAC: RRR, no murmurs, rubs, gallops RESPIRATORY:  Clear to auscultation without rales, wheezing or rhonchi  ABDOMEN: Soft, non-tender, non-distended MUSCULOSKELETAL:  No edema; No deformity  SKIN: Skin rash, full body.  NEUROLOGIC:   Alert and oriented x 3 PSYCHIATRIC:  Normal affect   ASSESSMENT:    1. Paroxysmal atrial fibrillation (HCC)   2. Morbid obesity ([place])   3. Type 2 diabetes mellitus without complication, without long-term current use of insulin ([place])   4. Coronary artery disease involving native coronary artery of native heart with angina pectoris ([place])    PLAN:    In order of problems listed above:  Rash, possible drug allergy - Saw the dermatologist.  They request that he stopped his Lipitor.  He has done this.  Ultimately, I would like for him to be on some type of statin therapy given his underlying coronary artery disease to help reduce risks of heart attack.  He understand this.  Given the rash component, I suggested he go through some trial and error with Dr. Lysle Rubens to try different statin formulations.  No new recommendations at this time.  Coronary artery disease - Fairly significant disease noted in the LAD diagonal and circumflex in 2014.  Been treated medically.  Once again, would like for him to be on statin if possible.  Paroxysmal atrial flutter -Cardioversion 10/22/2018 successful - Heart rate previously was in the 40s with atrial flutter.  Currently sinus bradycardia 53 while on amiodarone 200 mg a day and decreased dose of Toprol to 12.5 mg a day.  Wears a heart rate watch.  Overall fairly stable.  Continue to utilize Eliquis.  He does state that it is $120 a month.  [facility].  We will also look at good Rx with Kristopher Oppenheim.  Fatigue/morbid obesity - This has not really gotten any better.  Encouraged daily exercise, weight loss.  Continue to encourage  Alcohol use - Drinks 3-4 alcoholic drinks in the evening afternoon.  Encouraged cessation given his atrial fibrillation and anticoagulation.  No changes  Decreased DLCO on PFT - Dr. Melvyn Novas.  Reviewed his note from 11/09/2018-continue amiodarone on his low-dose as  tolerated.  No evidence of toxicity such as decrease in DLCO or lung volumes.  Prior hematuria -Resolved.  No current issue  Medication Adjustments/Labs and Tests Ordered: Current medicines are reviewed at length with the patient today.  Concerns regarding medicines are outlined above.  No orders of the defined types were placed in this encounter.  No orders of the defined types were placed in this encounter.   Patient Instructions  Medication Instructions:  The current medical regimen is effective;  continue present plan and medications.  *If you need a refill on your cardiac medications before your next appointment, please call your pharmacy*  Follow-Up: At [facility], you and your health needs are our priority.  As part of our continuing mission to provide you with exceptional heart care, we have created designated Provider Care Teams.  These Care Teams include your primary Cardiologist (physician) and Advanced Practice Providers (APPs -  Physician Assistants and Nurse Practitioners) who all work together to provide you with the care you need, when you need it.  Your next appointment:   12 months  The format for your next appointment:   In Person  Provider:   You may see [name], MD or one of the following Advanced Practice Providers on your designated Care Team:    Truitt Merle, NP  Cecilie Kicks, NP  Kathyrn Drown, NP   Thank you for choosing [facility]!!        Signed, [name], MD  07/22/2019 2:19 PM    [place]

## 2019-07-22 NOTE — Patient Instructions (Signed)
Medication Instructions:  The current medical regimen is effective;  continue present plan and medications.  *If you need a refill on your cardiac medications before your next appointment, please call your pharmacy*  Follow-Up: At [facility], you and your health needs are our priority.  As part of our continuing mission to provide you with exceptional heart care, we have created designated Provider Care Teams.  These Care Teams include your primary Cardiologist (physician) and Advanced Practice Providers (APPs -  Physician Assistants and Nurse Practitioners) who all work together to provide you with the care you need, when you need it.  Your next appointment:   12 months  The format for your next appointment:   In Person  Provider:   You may see Mark Skains, MD or one of the following Advanced Practice Providers on your designated Care Team:    Lori Gerhardt, NP  Laura Ingold, NP  Jill McDaniel, NP   Thank you for choosing [facility]!!      

## 2019-07-30 DIAGNOSIS — R7989 Other specified abnormal findings of blood chemistry: Secondary | ICD-10-CM | POA: Diagnosis not present

## 2019-08-14 DIAGNOSIS — E119 Type 2 diabetes mellitus without complications: Secondary | ICD-10-CM | POA: Diagnosis not present

## 2019-08-14 DIAGNOSIS — N400 Enlarged prostate without lower urinary tract symptoms: Secondary | ICD-10-CM | POA: Diagnosis not present

## 2019-08-14 DIAGNOSIS — I2510 Atherosclerotic heart disease of native coronary artery without angina pectoris: Secondary | ICD-10-CM | POA: Diagnosis not present

## 2019-08-14 DIAGNOSIS — E1122 Type 2 diabetes mellitus with diabetic chronic kidney disease: Secondary | ICD-10-CM | POA: Diagnosis not present

## 2019-08-14 DIAGNOSIS — N182 Chronic kidney disease, stage 2 (mild): Secondary | ICD-10-CM | POA: Diagnosis not present

## 2019-08-14 DIAGNOSIS — N1831 Chronic kidney disease, stage 3a: Secondary | ICD-10-CM | POA: Diagnosis not present

## 2019-08-14 DIAGNOSIS — E785 Hyperlipidemia, unspecified: Secondary | ICD-10-CM | POA: Diagnosis not present

## 2019-08-14 DIAGNOSIS — E782 Mixed hyperlipidemia: Secondary | ICD-10-CM | POA: Diagnosis not present

## 2019-08-14 DIAGNOSIS — I10 Essential (primary) hypertension: Secondary | ICD-10-CM | POA: Diagnosis not present

## 2019-08-14 DIAGNOSIS — E039 Hypothyroidism, unspecified: Secondary | ICD-10-CM | POA: Diagnosis not present

## 2019-08-29 DIAGNOSIS — N3011 Interstitial cystitis (chronic) with hematuria: Secondary | ICD-10-CM | POA: Diagnosis not present

## 2019-08-29 DIAGNOSIS — R310 Gross hematuria: Secondary | ICD-10-CM | POA: Diagnosis not present

## 2019-08-29 DIAGNOSIS — R350 Frequency of micturition: Secondary | ICD-10-CM | POA: Diagnosis not present

## 2019-09-02 DIAGNOSIS — N400 Enlarged prostate without lower urinary tract symptoms: Secondary | ICD-10-CM | POA: Diagnosis not present

## 2019-09-02 DIAGNOSIS — E782 Mixed hyperlipidemia: Secondary | ICD-10-CM | POA: Diagnosis not present

## 2019-09-02 DIAGNOSIS — I2510 Atherosclerotic heart disease of native coronary artery without angina pectoris: Secondary | ICD-10-CM | POA: Diagnosis not present

## 2019-09-02 DIAGNOSIS — N182 Chronic kidney disease, stage 2 (mild): Secondary | ICD-10-CM | POA: Diagnosis not present

## 2019-09-02 DIAGNOSIS — E1122 Type 2 diabetes mellitus with diabetic chronic kidney disease: Secondary | ICD-10-CM | POA: Diagnosis not present

## 2019-09-02 DIAGNOSIS — E119 Type 2 diabetes mellitus without complications: Secondary | ICD-10-CM | POA: Diagnosis not present

## 2019-09-02 DIAGNOSIS — E039 Hypothyroidism, unspecified: Secondary | ICD-10-CM | POA: Diagnosis not present

## 2019-09-02 DIAGNOSIS — E785 Hyperlipidemia, unspecified: Secondary | ICD-10-CM | POA: Diagnosis not present

## 2019-09-02 DIAGNOSIS — I10 Essential (primary) hypertension: Secondary | ICD-10-CM | POA: Diagnosis not present

## 2019-09-02 DIAGNOSIS — N1831 Chronic kidney disease, stage 3a: Secondary | ICD-10-CM | POA: Diagnosis not present

## 2019-09-16 ENCOUNTER — Other Ambulatory Visit: Payer: Self-pay | Admitting: Cardiology

## 2019-09-17 NOTE — Telephone Encounter (Signed)
Age 78, weight 137kg, SCr 1.42 on 03/07/19, last OV Nov 2020, afib indication

## 2019-10-07 DIAGNOSIS — N3021 Other chronic cystitis with hematuria: Secondary | ICD-10-CM | POA: Diagnosis not present

## 2019-10-07 DIAGNOSIS — R310 Gross hematuria: Secondary | ICD-10-CM | POA: Diagnosis not present

## 2019-10-07 DIAGNOSIS — N3001 Acute cystitis with hematuria: Secondary | ICD-10-CM | POA: Diagnosis not present

## 2019-10-10 DIAGNOSIS — R630 Anorexia: Secondary | ICD-10-CM | POA: Diagnosis not present

## 2019-10-10 DIAGNOSIS — R5383 Other fatigue: Secondary | ICD-10-CM | POA: Diagnosis not present

## 2019-10-10 DIAGNOSIS — R0789 Other chest pain: Secondary | ICD-10-CM | POA: Diagnosis not present

## 2019-10-10 DIAGNOSIS — U071 COVID-19: Secondary | ICD-10-CM | POA: Diagnosis not present

## 2019-10-11 ENCOUNTER — Other Ambulatory Visit: Payer: Self-pay | Admitting: Adult Health

## 2019-10-11 DIAGNOSIS — U071 COVID-19: Secondary | ICD-10-CM

## 2019-10-11 DIAGNOSIS — Z1152 Encounter for screening for COVID-19: Secondary | ICD-10-CM | POA: Diagnosis not present

## 2019-10-11 DIAGNOSIS — R5383 Other fatigue: Secondary | ICD-10-CM | POA: Diagnosis not present

## 2019-10-11 NOTE — Progress Notes (Signed)
  I connected by phone with Derek [name] on 10/11/2019 at 1:51 PM to discuss the potential use of an new treatment for mild to moderate COVID-19 viral infection in non-hospitalized patients.  This patient is a 79 y.o. male that meets the FDA criteria for Emergency Use Authorization of bamlanivimab or casirivimab\imdevimab.  Has a (+) direct SARS-CoV-2 viral test result  Has mild or moderate COVID-19   Is ? 79 years of age and weighs ? 40 kg  Is NOT hospitalized due to COVID-19  Is NOT requiring oxygen therapy or requiring an increase in baseline oxygen flow rate due to COVID-19  Is within 10 days of symptom onset 1/16  Has at least one of the high risk factor(s) for progression to severe COVID-19 and/or hospitalization as defined in EUA.  Specific high risk criteria : >/= 78 yo   I have spoken and communicated the following to the patient or parent/caregiver:  1. FDA has authorized the emergency use of bamlanivimab and casirivimab\imdevimab for the treatment of mild to moderate COVID-19 in adults and pediatric patients with positive results of direct SARS-CoV-2 viral testing who are 1 years of age and older weighing at least 40 kg, and who are at high risk for progressing to severe COVID-19 and/or hospitalization.  2. The significant known and potential risks and benefits of bamlanivimab and casirivimab\imdevimab, and the extent to which such potential risks and benefits are unknown.  3. Information on available alternative treatments and the risks and benefits of those alternatives, including clinical trials.  4. Patients treated with bamlanivimab and casirivimab\imdevimab should continue to self-isolate and use infection control measures (e.g., wear mask, isolate, social distance, avoid sharing personal items, clean and disinfect "high touch" surfaces, and frequent handwashing) according to CDC guidelines.   5. The patient or parent/caregiver has the option to accept or refuse  bamlanivimab or casirivimab\imdevimab .  After reviewing this information with the patient, The patient agreed to proceed with receiving the casirivimab\imdevimab infusion and will be provided a copy of the Fact sheet prior to receiving the infusion.Scot Dock 10/11/2019 1:51 PM

## 2019-10-14 ENCOUNTER — Ambulatory Visit (HOSPITAL_COMMUNITY)
Admission: RE | Admit: 2019-10-14 | Discharge: 2019-10-14 | Disposition: A | Discharge status: Home / Self Care | Payer: Medicare Other | Source: Ambulatory Visit | Attending: Pulmonary Disease | Admitting: Pulmonary Disease

## 2019-10-14 DIAGNOSIS — Z23 Encounter for immunization: Secondary | ICD-10-CM | POA: Diagnosis not present

## 2019-10-14 DIAGNOSIS — U071 COVID-19: Secondary | ICD-10-CM | POA: Diagnosis not present

## 2019-10-14 MED ORDER — FAMOTIDINE IN NACL 20-0.9 MG/50ML-% IV SOLN: 20.0000 mg | Freq: Once | INTRAVENOUS | Status: DC | PRN

## 2019-10-14 MED ORDER — DIPHENHYDRAMINE HCL 50 MG/ML IJ SOLN: 50.0000 mg | Freq: Once | INTRAMUSCULAR | Status: DC | PRN

## 2019-10-14 MED ORDER — METHYLPREDNISOLONE SODIUM SUCC 125 MG IJ SOLR: 125.0000 mg | Freq: Once | INTRAMUSCULAR | Status: DC | PRN

## 2019-10-14 MED ORDER — ALBUTEROL SULFATE HFA 108 (90 BASE) MCG/ACT IN AERS: 2.0000 | INHALATION_SPRAY | Freq: Once | RESPIRATORY_TRACT | Status: DC | PRN

## 2019-10-14 MED ORDER — SODIUM CHLORIDE 0.9 % IV SOLN
INTRAVENOUS | Status: DC | PRN
  Administered 2019-10-14: 250 mL via INTRAVENOUS

## 2019-10-14 MED ORDER — EPINEPHRINE 0.3 MG/0.3ML IJ SOAJ: 0.3000 mg | Freq: Once | INTRAMUSCULAR | Status: DC | PRN

## 2019-10-14 MED ORDER — SODIUM CHLORIDE 0.9 % IV SOLN
Freq: Once | INTRAVENOUS | Status: AC
  Filled 2019-10-14: qty 10

## 2019-10-14 NOTE — Progress Notes (Signed)
  Diagnosis: COVID-19  Physician: Dr. Asencion Noble  Procedure: [facility] Med: casirivimab\imdevimab infusion - Provided patient with casirivimab\imdevimab fact sheet for patients, parents and caregivers prior to infusion.  Complications: No immediate complications noted.  Discharge: Discharged home   Derek [name] 10/14/2019

## 2019-10-14 NOTE — Discharge Instructions (Signed)
COVID-19 COVID-19 is a respiratory infection that is caused by a virus called severe acute respiratory syndrome coronavirus 2 (SARS-CoV-2). The disease is also known as coronavirus disease or novel coronavirus. In some people, the virus may not cause any symptoms. In others, it may cause a serious infection. The infection can get worse quickly and can lead to complications, such as:  Pneumonia, or infection of the lungs.  Acute respiratory distress syndrome or ARDS. This is a condition in which fluid build-up in the lungs prevents the lungs from filling with air and passing oxygen into the blood.  Acute respiratory failure. This is a condition in which there is not enough oxygen passing from the lungs to the body or when carbon dioxide is not passing from the lungs out of the body.  Sepsis or septic shock. This is a serious bodily reaction to an infection.  Blood clotting problems.  Secondary infections due to bacteria or fungus.  Organ failure. This is when your body's organs stop working. The virus that causes COVID-19 is contagious. This means that it can spread from person to person through droplets from coughs and sneezes (respiratory secretions). What are the causes? This illness is caused by a virus. You may catch the virus by:  Breathing in droplets from an infected person. Droplets can be spread by a person breathing, speaking, singing, coughing, or sneezing.  Touching something, like a table or a doorknob, that was exposed to the virus (contaminated) and then touching your mouth, nose, or eyes. What increases the risk? Risk for infection You are more likely to be infected with this virus if you:  Are within 6 feet (2 meters) of a person with COVID-19.  Provide care for or live with a person who is infected with COVID-19.  Spend time in crowded indoor spaces or live in shared housing. Risk for serious illness You are more likely to become seriously ill from the virus if  you:  Are 78 years of age or older. The higher your age, the more you are at risk for serious illness.  Live in a nursing home or long-term care facility.  Have cancer.  Have a long-term (chronic) disease such as: ? Chronic lung disease, including chronic obstructive pulmonary disease or asthma. ? A long-term disease that lowers your body's ability to fight infection (immunocompromised). ? Heart disease, including heart failure, a condition in which the arteries that lead to the heart become narrow or blocked (coronary artery disease), a disease which makes the heart muscle thick, weak, or stiff (cardiomyopathy). ? Diabetes. ? Chronic kidney disease. ? Sickle cell disease, a condition in which red blood cells have an abnormal "sickle" shape. ? Liver disease.  Are obese. What are the signs or symptoms? Symptoms of this condition can range from mild to severe. Symptoms may appear any time from 2 to 14 days after being exposed to the virus. They include:  A fever or chills.  A cough.  Difficulty breathing.  Headaches, body aches, or muscle aches.  Runny or stuffy (congested) nose.  A sore throat.  New loss of taste or smell. Some people may also have stomach problems, such as nausea, vomiting, or diarrhea. Other people may not have any symptoms of COVID-19. How is this diagnosed? This condition may be diagnosed based on:  Your signs and symptoms, especially if: ? You live in an area with a COVID-19 outbreak. ? You recently traveled to or from an area where the virus is common. ? You   provide care for or live with a person who was diagnosed with COVID-19. ? You were exposed to a person who was diagnosed with COVID-19.  A physical exam.  Lab tests, which may include: ? Taking a sample of fluid from the back of your nose and throat (nasopharyngeal fluid), your nose, or your throat using a swab. ? A sample of mucus from your lungs (sputum). ? Blood tests.  Imaging tests,  which may include, X-rays, CT scan, or ultrasound. How is this treated? At present, there is no medicine to treat COVID-19. Medicines that treat other diseases are being used on a trial basis to see if they are effective against COVID-19. Your health care provider will talk with you about ways to treat your symptoms. For most people, the infection is mild and can be managed at home with rest, fluids, and over-the-counter medicines. Treatment for a serious infection usually takes places in a hospital intensive care unit (ICU). It may include one or more of the following treatments. These treatments are given until your symptoms improve.  Receiving fluids and medicines through an IV.  Supplemental oxygen. Extra oxygen is given through a tube in the nose, a face mask, or a hood.  Positioning you to lie on your stomach (prone position). This makes it easier for oxygen to get into the lungs.  Continuous positive airway pressure (CPAP) or bi-level positive airway pressure (BPAP) machine. This treatment uses mild air pressure to keep the airways open. A tube that is connected to a motor delivers oxygen to the body.  Ventilator. This treatment moves air into and out of the lungs by using a tube that is placed in your windpipe.  Tracheostomy. This is a procedure to create a hole in the neck so that a breathing tube can be inserted.  Extracorporeal membrane oxygenation (ECMO). This procedure gives the lungs a chance to recover by taking over the functions of the heart and lungs. It supplies oxygen to the body and removes carbon dioxide. Follow these instructions at home: Lifestyle  If you are sick, stay home except to get medical care. Your health care provider will tell you how long to stay home. Call your health care provider before you go for medical care.  Rest at home as told by your health care provider.  Do not use any products that contain nicotine or tobacco, such as cigarettes,  e-cigarettes, and chewing tobacco. If you need help quitting, ask your health care provider.  Return to your normal activities as told by your health care provider. Ask your health care provider what activities are safe for you. General instructions  Take over-the-counter and prescription medicines only as told by your health care provider.  Drink enough fluid to keep your urine pale yellow.  Keep all follow-up visits as told by your health care provider. This is important. How is this prevented?  There is no vaccine to help prevent COVID-19 infection. However, there are steps you can take to protect yourself and others from this virus. To protect yourself:   Do not travel to areas where COVID-19 is a risk. The areas where COVID-19 is reported change often. To identify high-risk areas and travel restrictions, check the CDC travel website: wwwnc.cdc.gov/travel/notices  If you live in, or must travel to, an area where COVID-19 is a risk, take precautions to avoid infection. ? Stay away from people who are sick. ? Wash your hands often with soap and water for 20 seconds. If soap and water   are not available, use an alcohol-based hand sanitizer. ? Avoid touching your mouth, face, eyes, or nose. ? Avoid going out in public, follow guidance from your state and local health authorities. ? If you must go out in public, wear a cloth face covering or face mask. Make sure your mask covers your nose and mouth. ? Avoid crowded indoor spaces. Stay at least 6 feet (2 meters) away from others. ? Disinfect objects and surfaces that are frequently touched every day. This may include:  Counters and tables.  Doorknobs and light switches.  Sinks and faucets.  Electronics, such as phones, remote controls, keyboards, computers, and tablets. To protect others: If you have symptoms of COVID-19, take steps to prevent the virus from spreading to others.  If you think you have a COVID-19 infection, contact  your health care provider right away. Tell your health care team that you think you may have a COVID-19 infection.  Stay home. Leave your house only to seek medical care. Do not use public transport.  Do not travel while you are sick.  Wash your hands often with soap and water for 20 seconds. If soap and water are not available, use alcohol-based hand sanitizer.  Stay away from other members of your household. Let healthy household members care for children and pets, if possible. If you have to care for children or pets, wash your hands often and wear a mask. If possible, stay in your own room, separate from others. Use a different bathroom.  Make sure that all people in your household wash their hands well and often.  Cough or sneeze into a tissue or your sleeve or elbow. Do not cough or sneeze into your hand or into the air.  Wear a cloth face covering or face mask. Make sure your mask covers your nose and mouth. Where to find more information  Centers for Disease Control and Prevention: www.cdc.gov/coronavirus/2019-ncov/index.html  World Health Organization: www.who.int/health-topics/coronavirus Contact a health care provider if:  You live in or have traveled to an area where COVID-19 is a risk and you have symptoms of the infection.  You have had contact with someone who has COVID-19 and you have symptoms of the infection. Get help right away if:  You have trouble breathing.  You have pain or pressure in your chest.  You have confusion.  You have bluish lips and fingernails.  You have difficulty waking from sleep.  You have symptoms that get worse. These symptoms may represent a serious problem that is an emergency. Do not wait to see if the symptoms will go away. Get medical help right away. Call your local emergency services (911 in the [place] Do not drive yourself to the hospital. Let the emergency medical personnel know if you think you have  COVID-19. Summary  COVID-19 is a respiratory infection that is caused by a virus. It is also known as coronavirus disease or novel coronavirus. It can cause serious infections, such as pneumonia, acute respiratory distress syndrome, acute respiratory failure, or sepsis.  The virus that causes COVID-19 is contagious. This means that it can spread from person to person through droplets from breathing, speaking, singing, coughing, or sneezing.  You are more likely to develop a serious illness if you are 50 years of age or older, have a weak immune system, live in a nursing home, or have chronic disease.  There is no medicine to treat COVID-19. Your health care provider will talk with you about ways to treat your symptoms.    Take steps to protect yourself and others from infection. Wash your hands often and disinfect objects and surfaces that are frequently touched every day. Stay away from people who are sick and wear a mask if you are sick. This information is not intended to replace advice given to you by your health care provider. Make sure you discuss any questions you have with your health care provider. Document Revised: 07/05/2019 Document Reviewed: 10/11/2018 Elsevier Patient Education  2020 Elsevier Inc. What types of side effects do monoclonal antibody drugs cause?  Common side effects  In general, the more common side effects caused by monoclonal antibody drugs include: . Allergic reactions, such as hives or itching . Flu-like signs and symptoms, including chills, fatigue, fever, and muscle aches and pains . Nausea, vomiting . Diarrhea . Skin rashes . Low blood pressure   The CDC is recommending patients who receive monoclonal antibody treatments wait at least 90 days before being vaccinated.  Currently, there are no data on the safety and efficacy of mRNA COVID-19 vaccines in persons who received monoclonal antibodies or convalescent plasma as part of COVID-19 treatment. Based  on the estimated half-life of such therapies as well as evidence suggesting that reinfection is uncommon in the 90 days after initial infection, vaccination should be deferred for at least 90 days, as a precautionary measure until additional information becomes available, to avoid interference of the antibody treatment with vaccine-induced immune responses. 

## 2019-10-15 ENCOUNTER — Telehealth: Payer: Self-pay | Admitting: Cardiology

## 2019-10-15 NOTE — Telephone Encounter (Signed)
Will do thanks [employer].

## 2019-10-15 NOTE — Telephone Encounter (Signed)
Hey lynn, LPN pt is requesting samples of Eliquis. Could you please advise in this matter? Thank you

## 2019-10-15 NOTE — Telephone Encounter (Signed)
New Message    Patient calling the office for samples of medication:   1.  What medication and dosage are you requesting samples for? Eliquis 5 mg   2.  Are you currently out of this medication? Dawn from [facility] is calling requesting the samples and says the Pt is out of medication     Please call

## 2019-10-15 NOTE — Telephone Encounter (Signed)
**Note De-Identified  Obfuscation** The pt states that he has a deductible that he cannot afford to pay at this time and states that as soon as his check comes in he should be able to pay his deductible and and will be able to afford the cost of Eliquis thereafter.  He is advised that we are leaving him 2 bottles of Eliquis 5 mg samples to pick up at the downstairs lobby of Dr Marlou Porch off at our Covid screening table.  He verbalized understanding and is appreciative for the assistance.

## 2019-10-16 ENCOUNTER — Encounter (HOSPITAL_COMMUNITY): Payer: Self-pay

## 2019-10-24 DIAGNOSIS — I10 Essential (primary) hypertension: Secondary | ICD-10-CM | POA: Diagnosis not present

## 2019-10-24 DIAGNOSIS — N182 Chronic kidney disease, stage 2 (mild): Secondary | ICD-10-CM | POA: Diagnosis not present

## 2019-10-24 DIAGNOSIS — N183 Chronic kidney disease, stage 3 unspecified: Secondary | ICD-10-CM | POA: Diagnosis not present

## 2019-10-24 DIAGNOSIS — I2510 Atherosclerotic heart disease of native coronary artery without angina pectoris: Secondary | ICD-10-CM | POA: Diagnosis not present

## 2019-10-24 DIAGNOSIS — Z7984 Long term (current) use of oral hypoglycemic drugs: Secondary | ICD-10-CM | POA: Diagnosis not present

## 2019-10-24 DIAGNOSIS — E119 Type 2 diabetes mellitus without complications: Secondary | ICD-10-CM | POA: Diagnosis not present

## 2019-10-24 DIAGNOSIS — E1122 Type 2 diabetes mellitus with diabetic chronic kidney disease: Secondary | ICD-10-CM | POA: Diagnosis not present

## 2019-10-24 DIAGNOSIS — E785 Hyperlipidemia, unspecified: Secondary | ICD-10-CM | POA: Diagnosis not present

## 2019-10-24 DIAGNOSIS — E782 Mixed hyperlipidemia: Secondary | ICD-10-CM | POA: Diagnosis not present

## 2019-10-24 DIAGNOSIS — N400 Enlarged prostate without lower urinary tract symptoms: Secondary | ICD-10-CM | POA: Diagnosis not present

## 2019-10-24 DIAGNOSIS — E039 Hypothyroidism, unspecified: Secondary | ICD-10-CM | POA: Diagnosis not present

## 2019-10-25 DIAGNOSIS — U071 COVID-19: Secondary | ICD-10-CM | POA: Diagnosis not present

## 2019-11-11 DIAGNOSIS — R310 Gross hematuria: Secondary | ICD-10-CM | POA: Diagnosis not present

## 2019-11-11 DIAGNOSIS — N200 Calculus of kidney: Secondary | ICD-10-CM | POA: Diagnosis not present

## 2019-11-12 ENCOUNTER — Other Ambulatory Visit: Payer: Self-pay | Admitting: Urology

## 2019-11-12 ENCOUNTER — Telehealth: Payer: Self-pay | Admitting: Cardiology

## 2019-11-12 DIAGNOSIS — N200 Calculus of kidney: Secondary | ICD-10-CM

## 2019-11-12 NOTE — Telephone Encounter (Signed)
   [facility] Pre-operative Risk Assessment    Request for surgical clearance:  1. What type of surgery is being performed? liphotropsy  2. When is this surgery scheduled?  11/18/19  3. What type of clearance is required (medical clearance vs. Pharmacy clearance to hold med vs. Both)? both  4. Are there any medications that need to be held prior to surgery and how long? Eliquis, 48 hours prior to surgery   5. Practice name and name of physician performing surgery? [facility], Dr. Gloriann Loan   6. What is your office phone number [phone] ext [phone]   7.   What is your office fax number [phone]  8.   Anesthesia type (None, local, MAC, general) ? local   Ermelinda Das 11/12/2019, 4:37 PM  _________________________________________________________________   (provider comments below)

## 2019-11-12 NOTE — Telephone Encounter (Signed)
Pharm please address ELiquis thanks

## 2019-11-13 ENCOUNTER — Telehealth: Payer: Self-pay

## 2019-11-13 NOTE — Telephone Encounter (Signed)
Pt takes Eliquis for afib with CHADS2VASc score of 5 (age x2, HTN, DM, CAD). SCr 1.42, CrCl 84 using actual body weight, 65 using adjusted body weight. Ok to hold Eliquis 2 days prior to procedure as requested.

## 2019-11-13 NOTE — Telephone Encounter (Signed)
-----   Message from Frederik Schmidt, RN sent at 11/13/2019 10:28 AM EST -----  ----- Message ----- From: Isaiah Serge, NP Sent: 11/13/2019  10:13 AM EST To: Cv [facility] Triage  Pt wants to know if we have samples of eliquis to get him through until he is seen at [facility]- please let him know either way thanks

## 2019-11-13 NOTE — Telephone Encounter (Signed)
I called and spoke with patient, he is aware that samples are ready for pick up.

## 2019-11-13 NOTE — Telephone Encounter (Signed)
   Primary Cardiologist: [name], MD  Chart reviewed as part of pre-operative protocol coverage. Patient was contacted 11/13/2019 in reference to pre-operative risk assessment for pending surgery as outlined below.  Derek [name] was last seen on 07/22/19 by Dr. Marlou Porch for CAD, PAF HTN and was stable.  Since that day, Derek [name] has done well with no angina or atrial fib and is able to meet 4 METS of activity without angina.   He may hold Eliquis 2 days prior to procedure and resume post op.    Therefore, based on ACC/AHA guidelines, the patient would be at acceptable risk for the planned procedure without further cardiovascular testing.   I will route this recommendation to the requesting party via Epic fax function and remove from pre-op pool.  Please call with questions.  Cecilie Kicks, NP 11/13/2019, 10:09 AM

## 2019-11-15 NOTE — Progress Notes (Signed)
Talked with patient for surgery on Monday ([place]). Instructions given

## 2019-11-18 ENCOUNTER — Encounter (HOSPITAL_BASED_OUTPATIENT_CLINIC_OR_DEPARTMENT_OTHER): Payer: Self-pay | Admitting: Urology

## 2019-11-18 ENCOUNTER — Ambulatory Visit (HOSPITAL_BASED_OUTPATIENT_CLINIC_OR_DEPARTMENT_OTHER)
Admission: RE | Admit: 2019-11-18 | Discharge: 2019-11-18 | Disposition: A | Discharge status: Home / Self Care | Payer: Medicare Other | Attending: Urology | Admitting: Urology

## 2019-11-18 ENCOUNTER — Ambulatory Visit (HOSPITAL_COMMUNITY): Payer: Medicare Other

## 2019-11-18 ENCOUNTER — Encounter (HOSPITAL_BASED_OUTPATIENT_CLINIC_OR_DEPARTMENT_OTHER)
Admission: RE | Disposition: A | Discharge status: Home / Self Care | Payer: Self-pay | Source: Home / Self Care | Attending: Urology

## 2019-11-18 DIAGNOSIS — E118 Type 2 diabetes mellitus with unspecified complications: Secondary | ICD-10-CM | POA: Insufficient documentation

## 2019-11-18 DIAGNOSIS — E669 Obesity, unspecified: Secondary | ICD-10-CM | POA: Insufficient documentation

## 2019-11-18 DIAGNOSIS — Z01818 Encounter for other preprocedural examination: Secondary | ICD-10-CM | POA: Diagnosis not present

## 2019-11-18 DIAGNOSIS — I499 Cardiac arrhythmia, unspecified: Secondary | ICD-10-CM | POA: Insufficient documentation

## 2019-11-18 DIAGNOSIS — I10 Essential (primary) hypertension: Secondary | ICD-10-CM | POA: Insufficient documentation

## 2019-11-18 DIAGNOSIS — N200 Calculus of kidney: Secondary | ICD-10-CM | POA: Diagnosis not present

## 2019-11-18 DIAGNOSIS — K219 Gastro-esophageal reflux disease without esophagitis: Secondary | ICD-10-CM | POA: Insufficient documentation

## 2019-11-18 DIAGNOSIS — N201 Calculus of ureter: Secondary | ICD-10-CM

## 2019-11-18 HISTORY — PX: EXTRACORPOREAL SHOCK WAVE LITHOTRIPSY: SHX1557

## 2019-11-18 LAB — GLUCOSE, CAPILLARY
Glucose-Capillary: 134 mg/dL — ABNORMAL HIGH (ref 70–99)
Glucose-Capillary: 120 mg/dL — ABNORMAL HIGH (ref 70–99)

## 2019-11-18 SURGERY — LITHOTRIPSY, ESWL
Anesthesia: LOCAL | Laterality: Right

## 2019-11-18 MED ORDER — DIPHENHYDRAMINE HCL 25 MG PO CAPS
ORAL_CAPSULE | ORAL | Status: AC
  Filled 2019-11-18: qty 1

## 2019-11-18 MED ORDER — DIAZEPAM 5 MG PO TABS
ORAL_TABLET | ORAL | Status: AC
  Filled 2019-11-18: qty 2

## 2019-11-18 MED ORDER — DIPHENHYDRAMINE HCL 25 MG PO CAPS
25.0000 mg | ORAL_CAPSULE | ORAL | Status: AC
  Administered 2019-11-18: 09:00:00 25 mg via ORAL
  Filled 2019-11-18: qty 1

## 2019-11-18 MED ORDER — CIPROFLOXACIN HCL 500 MG PO TABS
500.0000 mg | ORAL_TABLET | ORAL | Status: AC
  Administered 2019-11-18: 09:00:00 500 mg via ORAL
  Filled 2019-11-18: qty 1

## 2019-11-18 MED ORDER — HYDROCODONE-ACETAMINOPHEN 5-325 MG PO TABS: 1.0000 | ORAL_TABLET | ORAL | 0 refills | Status: DC | PRN

## 2019-11-18 MED ORDER — DIAZEPAM 5 MG PO TABS
10.0000 mg | ORAL_TABLET | ORAL | Status: AC
  Administered 2019-11-18: 10 mg via ORAL
  Filled 2019-11-18: qty 2

## 2019-11-18 MED ORDER — SODIUM CHLORIDE 0.9 % IV SOLN
INTRAVENOUS | Status: DC
  Filled 2019-11-18: qty 1000

## 2019-11-18 MED ORDER — CIPROFLOXACIN HCL 500 MG PO TABS
ORAL_TABLET | ORAL | Status: AC
  Filled 2019-11-18: qty 1

## 2019-11-18 NOTE — Op Note (Signed)
See [facility] OP note scanned into chart. Also because of the size, density, location and other factors that cannot be anticipated I feel this will likely be a staged procedure. This fact supersedes any indication in the scanned [facility] operative note to the contrary.  

## 2019-11-18 NOTE — Discharge Instructions (Signed)

## 2019-11-18 NOTE — H&P (Signed)
See scanned H&P

## 2019-11-20 DIAGNOSIS — N400 Enlarged prostate without lower urinary tract symptoms: Secondary | ICD-10-CM | POA: Diagnosis not present

## 2019-11-20 DIAGNOSIS — E039 Hypothyroidism, unspecified: Secondary | ICD-10-CM | POA: Diagnosis not present

## 2019-11-20 DIAGNOSIS — I2510 Atherosclerotic heart disease of native coronary artery without angina pectoris: Secondary | ICD-10-CM | POA: Diagnosis not present

## 2019-11-20 DIAGNOSIS — E1122 Type 2 diabetes mellitus with diabetic chronic kidney disease: Secondary | ICD-10-CM | POA: Diagnosis not present

## 2019-11-20 DIAGNOSIS — E119 Type 2 diabetes mellitus without complications: Secondary | ICD-10-CM | POA: Diagnosis not present

## 2019-11-20 DIAGNOSIS — N183 Chronic kidney disease, stage 3 unspecified: Secondary | ICD-10-CM | POA: Diagnosis not present

## 2019-11-20 DIAGNOSIS — N182 Chronic kidney disease, stage 2 (mild): Secondary | ICD-10-CM | POA: Diagnosis not present

## 2019-11-20 DIAGNOSIS — E782 Mixed hyperlipidemia: Secondary | ICD-10-CM | POA: Diagnosis not present

## 2019-11-20 DIAGNOSIS — I10 Essential (primary) hypertension: Secondary | ICD-10-CM | POA: Diagnosis not present

## 2019-11-20 DIAGNOSIS — E785 Hyperlipidemia, unspecified: Secondary | ICD-10-CM | POA: Diagnosis not present

## 2019-12-24 DIAGNOSIS — N201 Calculus of ureter: Secondary | ICD-10-CM | POA: Diagnosis not present

## 2019-12-30 DIAGNOSIS — N200 Calculus of kidney: Secondary | ICD-10-CM | POA: Diagnosis not present

## 2020-01-01 ENCOUNTER — Telehealth: Payer: Self-pay | Admitting: Cardiology

## 2020-01-01 NOTE — Telephone Encounter (Signed)
Patient calling the office for samples of medication:   1.  What medication and dosage are you requesting samples for? ELIQUIS 5 MG TABS tablet  2.  Are you currently out of this medication? No   Cecille Rubin is requesting a 1 month supply of Eliquis Samples for the patient.

## 2020-01-01 NOTE — Telephone Encounter (Addendum)
**Note De-Identified  Obfuscation** No samples and send to nurse as the pt may need to be placed on a med less expensive. I have s/w the pt concerning ths in the past and we have provided samples off and on to him this year.  I feel Derek [name] has been providing him samples as well.

## 2020-01-01 NOTE — Telephone Encounter (Signed)
Derek Hough, LPN, this pt has been getting samples of Eliquis, since the beginning of the year. Can you please advise in this matter? Thank you

## 2020-01-02 DIAGNOSIS — N400 Enlarged prostate without lower urinary tract symptoms: Secondary | ICD-10-CM | POA: Diagnosis not present

## 2020-01-02 DIAGNOSIS — N183 Chronic kidney disease, stage 3 unspecified: Secondary | ICD-10-CM | POA: Diagnosis not present

## 2020-01-02 DIAGNOSIS — E785 Hyperlipidemia, unspecified: Secondary | ICD-10-CM | POA: Diagnosis not present

## 2020-01-02 DIAGNOSIS — N182 Chronic kidney disease, stage 2 (mild): Secondary | ICD-10-CM | POA: Diagnosis not present

## 2020-01-02 DIAGNOSIS — I10 Essential (primary) hypertension: Secondary | ICD-10-CM | POA: Diagnosis not present

## 2020-01-02 DIAGNOSIS — E039 Hypothyroidism, unspecified: Secondary | ICD-10-CM | POA: Diagnosis not present

## 2020-01-02 DIAGNOSIS — E119 Type 2 diabetes mellitus without complications: Secondary | ICD-10-CM | POA: Diagnosis not present

## 2020-01-02 DIAGNOSIS — I2510 Atherosclerotic heart disease of native coronary artery without angina pectoris: Secondary | ICD-10-CM | POA: Diagnosis not present

## 2020-01-02 DIAGNOSIS — E1122 Type 2 diabetes mellitus with diabetic chronic kidney disease: Secondary | ICD-10-CM | POA: Diagnosis not present

## 2020-01-02 DIAGNOSIS — E782 Mixed hyperlipidemia: Secondary | ICD-10-CM | POA: Diagnosis not present

## 2020-01-02 NOTE — Telephone Encounter (Signed)
**Note De-Identified  Obfuscation** Cathy, I am concerned that the pt should not be going without his Eliquis. Please leave him 2 bottles of Eliquis 5 mg samples downstairs and I will call him to get update on his situation. Thank you.

## 2020-01-02 NOTE — Telephone Encounter (Signed)
Ok lynn, leaving pt 2 boxes of Eliquis downstairs for pt to pick up. Thanks

## 2020-01-02 NOTE — Telephone Encounter (Signed)
**Note De-Identified  Obfuscation** No answer so I left a message on the pts VM asking him to call Jeani Hawking at Dr [facility] office at [phone] concerning Eliquis samples.  I called Cecille Rubin at [employer] back. She reports that the pt is completely out of Eliquis at this time. She states that she has been trying to help the pt afford his Eliquis but is having no success at all. She states that she has considered pt asst through [facility] but that she feels the pt not only has not met his 3% out of pocket limit or met his yearly INS deductible but that he is over qualified to be approved as well.  I have asked Cecille Rubin to please advise the pt to call me if she is able to contact him. She states that she will and thanked me for calling her back to discuss this matter.

## 2020-01-03 NOTE — Telephone Encounter (Signed)
Offer him switch to coumadin Thanks [name], MD

## 2020-01-03 NOTE — Telephone Encounter (Signed)
I spoke to the patient who will call us next week after visiting with the [facility] on Tuesday to further discuss switching from Eliquis to Coumadin.  He wants to discuss with them, apparently.

## 2020-01-10 NOTE — Telephone Encounter (Signed)
Pt has not called back at this point. Will close this encounter for now and wait for further needs from the patient.

## 2020-01-22 DIAGNOSIS — I10 Essential (primary) hypertension: Secondary | ICD-10-CM | POA: Diagnosis not present

## 2020-01-22 DIAGNOSIS — I4892 Unspecified atrial flutter: Secondary | ICD-10-CM | POA: Diagnosis not present

## 2020-01-22 DIAGNOSIS — E1122 Type 2 diabetes mellitus with diabetic chronic kidney disease: Secondary | ICD-10-CM | POA: Diagnosis not present

## 2020-01-22 DIAGNOSIS — C9110 Chronic lymphocytic leukemia of B-cell type not having achieved remission: Secondary | ICD-10-CM | POA: Diagnosis not present

## 2020-01-22 DIAGNOSIS — I2510 Atherosclerotic heart disease of native coronary artery without angina pectoris: Secondary | ICD-10-CM | POA: Diagnosis not present

## 2020-01-22 DIAGNOSIS — L299 Pruritus, unspecified: Secondary | ICD-10-CM | POA: Diagnosis not present

## 2020-01-22 DIAGNOSIS — L0390 Cellulitis, unspecified: Secondary | ICD-10-CM | POA: Diagnosis not present

## 2020-01-22 DIAGNOSIS — Z23 Encounter for immunization: Secondary | ICD-10-CM | POA: Diagnosis not present

## 2020-01-22 DIAGNOSIS — R5383 Other fatigue: Secondary | ICD-10-CM | POA: Diagnosis not present

## 2020-01-22 DIAGNOSIS — E782 Mixed hyperlipidemia: Secondary | ICD-10-CM | POA: Diagnosis not present

## 2020-01-22 DIAGNOSIS — E039 Hypothyroidism, unspecified: Secondary | ICD-10-CM | POA: Diagnosis not present

## 2020-01-22 DIAGNOSIS — N183 Chronic kidney disease, stage 3 unspecified: Secondary | ICD-10-CM | POA: Diagnosis not present

## 2020-01-31 ENCOUNTER — Ambulatory Visit
Admission: RE | Admit: 2020-01-31 | Discharge: 2020-01-31 | Disposition: A | Discharge status: Home / Self Care | Payer: Medicare Other | Source: Ambulatory Visit | Attending: Internal Medicine | Admitting: Internal Medicine

## 2020-01-31 ENCOUNTER — Other Ambulatory Visit: Payer: Self-pay | Admitting: Internal Medicine

## 2020-01-31 DIAGNOSIS — E039 Hypothyroidism, unspecified: Secondary | ICD-10-CM | POA: Diagnosis not present

## 2020-01-31 DIAGNOSIS — N400 Enlarged prostate without lower urinary tract symptoms: Secondary | ICD-10-CM | POA: Diagnosis not present

## 2020-01-31 DIAGNOSIS — N179 Acute kidney failure, unspecified: Secondary | ICD-10-CM | POA: Diagnosis not present

## 2020-01-31 DIAGNOSIS — L0390 Cellulitis, unspecified: Secondary | ICD-10-CM | POA: Diagnosis not present

## 2020-01-31 DIAGNOSIS — N132 Hydronephrosis with renal and ureteral calculous obstruction: Secondary | ICD-10-CM | POA: Diagnosis not present

## 2020-01-31 DIAGNOSIS — E785 Hyperlipidemia, unspecified: Secondary | ICD-10-CM | POA: Diagnosis not present

## 2020-01-31 DIAGNOSIS — E782 Mixed hyperlipidemia: Secondary | ICD-10-CM | POA: Diagnosis not present

## 2020-01-31 DIAGNOSIS — N183 Chronic kidney disease, stage 3 unspecified: Secondary | ICD-10-CM | POA: Diagnosis not present

## 2020-01-31 DIAGNOSIS — E1122 Type 2 diabetes mellitus with diabetic chronic kidney disease: Secondary | ICD-10-CM | POA: Diagnosis not present

## 2020-01-31 DIAGNOSIS — I10 Essential (primary) hypertension: Secondary | ICD-10-CM | POA: Diagnosis not present

## 2020-01-31 DIAGNOSIS — N182 Chronic kidney disease, stage 2 (mild): Secondary | ICD-10-CM | POA: Diagnosis not present

## 2020-01-31 DIAGNOSIS — T148XXA Other injury of unspecified body region, initial encounter: Secondary | ICD-10-CM

## 2020-01-31 DIAGNOSIS — E119 Type 2 diabetes mellitus without complications: Secondary | ICD-10-CM | POA: Diagnosis not present

## 2020-01-31 DIAGNOSIS — L299 Pruritus, unspecified: Secondary | ICD-10-CM | POA: Diagnosis not present

## 2020-01-31 DIAGNOSIS — I2510 Atherosclerotic heart disease of native coronary artery without angina pectoris: Secondary | ICD-10-CM | POA: Diagnosis not present

## 2020-02-12 DIAGNOSIS — N179 Acute kidney failure, unspecified: Secondary | ICD-10-CM | POA: Diagnosis not present

## 2020-02-26 ENCOUNTER — Telehealth: Payer: Self-pay

## 2020-02-26 NOTE — Telephone Encounter (Signed)
-----   Message from [name], MD sent at 02/25/2020  4:57 PM EDT ----- Regarding: 6/18 is overbooked, can you move his appt to the following week?

## 2020-02-26 NOTE — Telephone Encounter (Signed)
Called and given below message. He verbalized understanding. Moved appts to 6/21. He is aware of times.

## 2020-03-06 ENCOUNTER — Other Ambulatory Visit: Payer: Medicare Other

## 2020-03-06 ENCOUNTER — Ambulatory Visit: Payer: Medicare Other | Admitting: Hematology and Oncology

## 2020-03-09 ENCOUNTER — Inpatient Hospital Stay (HOSPITAL_BASED_OUTPATIENT_CLINIC_OR_DEPARTMENT_OTHER): Payer: Medicare Other | Admitting: Hematology and Oncology

## 2020-03-09 ENCOUNTER — Other Ambulatory Visit: Payer: Self-pay

## 2020-03-09 ENCOUNTER — Encounter: Payer: Self-pay | Admitting: Hematology and Oncology

## 2020-03-09 ENCOUNTER — Inpatient Hospital Stay: Payer: Medicare Other | Attending: Hematology and Oncology

## 2020-03-09 VITALS — BP 129/69 | HR 62 | Temp 97.8°F | Resp 18 | Ht 76.0 in | Wt 304.2 lb

## 2020-03-09 DIAGNOSIS — Z7901 Long term (current) use of anticoagulants: Secondary | ICD-10-CM | POA: Diagnosis not present

## 2020-03-09 DIAGNOSIS — C9110 Chronic lymphocytic leukemia of B-cell type not having achieved remission: Secondary | ICD-10-CM

## 2020-03-09 DIAGNOSIS — D61818 Other pancytopenia: Secondary | ICD-10-CM

## 2020-03-09 DIAGNOSIS — R591 Generalized enlarged lymph nodes: Secondary | ICD-10-CM | POA: Diagnosis not present

## 2020-03-09 DIAGNOSIS — Z79899 Other long term (current) drug therapy: Secondary | ICD-10-CM | POA: Diagnosis not present

## 2020-03-09 DIAGNOSIS — R590 Localized enlarged lymph nodes: Secondary | ICD-10-CM | POA: Diagnosis not present

## 2020-03-09 LAB — CBC WITH DIFFERENTIAL/PLATELET
Abs Immature Granulocytes: 0.01 10*3/uL (ref 0.00–0.07)
Basophils Absolute: 0 10*3/uL (ref 0.0–0.1)
Basophils Relative: 1 %
Eosinophils Absolute: 0.1 10*3/uL (ref 0.0–0.5)
Eosinophils Relative: 1 %
HCT: 38.4 % — ABNORMAL LOW (ref 39.0–52.0)
Hemoglobin: 12.9 g/dL — ABNORMAL LOW (ref 13.0–17.0)
Immature Granulocytes: 0 %
Lymphocytes Relative: 18 %
Lymphs Abs: 1.1 10*3/uL (ref 0.7–4.0)
MCH: 30 pg (ref 26.0–34.0)
MCHC: 33.6 g/dL (ref 30.0–36.0)
MCV: 89.3 fL (ref 80.0–100.0)
Monocytes Absolute: 0.7 10*3/uL (ref 0.1–1.0)
Monocytes Relative: 11 %
Neutro Abs: 4.3 10*3/uL (ref 1.7–7.7)
Neutrophils Relative %: 69 %
Platelets: 129 10*3/uL — ABNORMAL LOW (ref 150–400)
RBC: 4.3 MIL/uL (ref 4.22–5.81)
RDW: 16.4 % — ABNORMAL HIGH (ref 11.5–15.5)
WBC: 6.2 10*3/uL (ref 4.0–10.5)
nRBC: 0 % (ref 0.0–0.2)

## 2020-03-09 NOTE — Progress Notes (Signed)
Derek [name] OFFICE PROGRESS NOTE  Patient Care Team: Wenda Low, MD as PCP - General (Internal Medicine) Jerline Pain, MD as PCP - Cardiology (Cardiology) Jerline Pain, MD as Attending Physician (Cardiology)  ASSESSMENT & PLAN:  CLL (chronic lymphocytic leukemia) He has clinical palpable lymphadenopathy which is slightly more than previous visit He has mild anemia and thrombocytopenia which could be due to either CLL or alcohol intake Observe for now Plan to see him back in 6 months for further follow-up  Lymphadenopathy of head and neck This is likely due to CLL He is not symptomatic I plan to see him in 6 months for further follow-up  Pancytopenia, acquired [facility]) Recent CT imaging show no evidence of abdominal disease The pancytopenia is likely due to alcohol intake or CLL We discussed the importance of him staying abstinent from alcohol intake   Orders Placed This Encounter  Procedures  . Comprehensive metabolic panel    Standing Status:   Standing    Number of Occurrences:   33    Standing Expiration Date:   03/09/2021  . CBC with Differential/Platelet    Standing Status:   Standing    Number of Occurrences:   22    Standing Expiration Date:   03/09/2021  . Lactate dehydrogenase    Standing Status:   Future    Standing Expiration Date:   03/09/2021    All questions were answered. The patient knows to call the clinic with any problems, questions or concerns. The total time spent in the appointment was 20 minutes encounter with patients including review of chart and various tests results, discussions about plan of care and coordination of care plan   [name], MD 03/09/2020 12:23 PM  INTERVAL HISTORY: Please see below for problem oriented charting. He returns for further follow-up for CLL The patient has gained a lot of weight He drinks 2-3 beers daily He denies recent chest pain or shortness of breath He bruises easily The patient denies  any recent signs or symptoms of bleeding such as spontaneous epistaxis, hematuria or hematochezia. He noticed some lymphadenopathy in the head and neck region but it does not bother him  SUMMARY OF ONCOLOGIC HISTORY: Oncology History Overview Note  Normal FISH   CLL (chronic lymphocytic leukemia) ([place])  09/10/2013 Initial Diagnosis   CLL (chronic lymphocytic leukemia)   12/26/2013 Pathology Results   FISH analysis was normal.   06/17/2014 Procedure   The patient has placement of Infuse-a-Port.   06/18/2014 Imaging   CT scan of the chest, abdomen and pelvis showed diffuse lymphadenopathy and splenomegaly.   06/19/2014 Bone Marrow Biopsy   Bone marrow aspirate and biopsy show CLL.   06/24/2014 - 11/18/2014 Chemotherapy   He received 6 cycles of Obinutuzumab and chlorambucil   09/15/2014 Imaging   Repeat CT scan of the chest, abdomen and pelvis show greater than 50% reduction in lymphadenopathy and splenomegaly   12/19/2014 Imaging   Repeat CT scan showed complete resolution of lymphadenopathy and splenomegaly.   03/09/2020 Cancer Staging   Staging form: Chronic Lymphocytic Leukemia / Small Lymphocytic Lymphoma, AJCC 8th Edition - Clinical stage from 03/09/2020: Modified Rai Stage I (Modified Rai risk: Intermediate, Lymphocytosis: Present, Adenopathy: Present, Organomegaly: Absent, Anemia: Absent, Thrombocytopenia: Absent) - Signed by [name], MD on 03/09/2020     REVIEW OF SYSTEMS:   Constitutional: Denies fevers, chills or abnormal weight loss Eyes: Denies blurriness of vision Ears, nose, mouth, throat, and face: Denies mucositis or sore throat  Respiratory: Denies cough, dyspnea or wheezes Cardiovascular: Denies palpitation, chest discomfort or lower extremity swelling Gastrointestinal:  Denies nausea, heartburn or change in bowel habits Neurological:Denies numbness, tingling or new weaknesses Behavioral/Psych: Mood is stable, no new changes  All other systems were reviewed with  the patient and are negative.  I have reviewed the past medical history, past surgical history, social history and family history with the patient and they are unchanged from previous note.  ALLERGIES:  is allergic to lipitor [atorvastatin].  MEDICATIONS:  Current Outpatient Medications  Medication Sig Dispense Refill  . amiodarone (PACERONE) 200 MG tablet Take 1 tablet (200 mg total) by mouth daily. 90 tablet 3  . ELIQUIS 5 MG TABS tablet TAKE 1 TABLET BY MOUTH TWICE A DAY 60 tablet 5  . JARDIANCE 10 MG TABS tablet TAKE 1 TABLET BY MOUTH EVERY DAY 90 mg 3  . levothyroxine (SYNTHROID) 25 MCG tablet Take 25 mcg by mouth daily.    . metoprolol succinate (TOPROL XL) 25 MG 24 hr tablet Take 0.5 tablets (12.5 mg total) by mouth daily. 45 tablet 3  . Omega-3 Fatty Acids (FISH OIL) 1200 MG CAPS Take 2,400 mg by mouth daily.     Marland Kitchen testosterone cypionate (DEPOTESTOSTERONE CYPIONATE) 200 MG/ML injection Inject 200 mg into the muscle every 14 (fourteen) days.      No current facility-administered medications for this visit.    PHYSICAL EXAMINATION: ECOG PERFORMANCE STATUS: 1 - Symptomatic but completely ambulatory  Vitals:   03/09/20 1157  BP: 129/69  Pulse: 62  Resp: 18  Temp: 97.8 F (36.6 C)  SpO2: 100%   Filed Weights   03/09/20 1157  Weight: (!) 304 lb 3.2 oz (138 kg)    GENERAL:alert, no distress and comfortable SKIN: skin color, texture, turgor are normal, no rashes or significant lesions.  Noted skin bruises EYES: normal, Conjunctiva are pink and non-injected, sclera clear OROPHARYNX:no exudate, no erythema and lips, buccal mucosa, and tongue normal  NECK: He has palpable lymphadenopathy bilaterally in the head and neck region and supraclavicular region as well LYMPH: Palpable cervical lymphadenopathy in supraclavicular region LUNGS: clear to auscultation and percussion with normal breathing effort HEART: regular rate & rhythm and no murmurs and no lower extremity  edema ABDOMEN:abdomen soft, non-tender and normal bowel sounds.  Significant central adipose tissue limiting physical examination Musculoskeletal:no cyanosis of digits and no clubbing  NEURO: alert & oriented x 3 with fluent speech, no focal motor/sensory deficits  LABORATORY DATA:  I have reviewed the data as listed    Component Value Date/Time   NA 136 03/07/2019 0840   NA 138 10/25/2018 1120   NA 140 06/25/2015 0758   K 4.3 03/07/2019 0840   K 3.9 06/25/2015 0758   CL 104 03/07/2019 0840   CL 101 03/11/2013 1031   CO2 21 (L) 03/07/2019 0840   CO2 25 06/25/2015 0758   GLUCOSE 166 (H) 03/07/2019 0840   GLUCOSE 140 06/25/2015 0758   GLUCOSE 173 (H) 03/11/2013 1031   BUN 18 03/07/2019 0840   BUN 16 10/25/2018 1120   BUN 15.9 06/25/2015 0758   CREATININE 1.42 (H) 03/07/2019 0840   CREATININE 1.58 (H) 03/06/2018 0816   CREATININE 1.0 06/25/2015 0758   CALCIUM 9.3 03/07/2019 0840   CALCIUM 9.3 06/25/2015 0758   PROT 7.0 03/07/2019 0840   PROT 6.7 06/25/2015 0758   ALBUMIN 3.7 03/07/2019 0840   ALBUMIN 3.8 06/25/2015 0758   AST 12 (L) 03/07/2019 0840   AST 12 03/06/2018  0816   AST 15 06/25/2015 0758   ALT 12 03/07/2019 0840   ALT 17 03/06/2018 0816   ALT 14 06/25/2015 0758   ALKPHOS 61 03/07/2019 0840   ALKPHOS 61 06/25/2015 0758   BILITOT 0.9 03/07/2019 0840   BILITOT 0.8 03/06/2018 0816   BILITOT 0.95 06/25/2015 0758   GFRNONAA 48 (L) 03/07/2019 0840   GFRNONAA 41 (L) 03/06/2018 0816   GFRAA 55 (L) 03/07/2019 0840   GFRAA 48 (L) 03/06/2018 0816    No results found for: SPEP, UPEP  Lab Results  Component Value Date   WBC 6.2 03/09/2020   NEUTROABS 4.3 03/09/2020   HGB 12.9 (L) 03/09/2020   HCT 38.4 (L) 03/09/2020   MCV 89.3 03/09/2020   PLT 129 (L) 03/09/2020      Chemistry      Component Value Date/Time   NA 136 03/07/2019 0840   NA 138 10/25/2018 1120   NA 140 06/25/2015 0758   K 4.3 03/07/2019 0840   K 3.9 06/25/2015 0758   CL 104 03/07/2019 0840    CL 101 03/11/2013 1031   CO2 21 (L) 03/07/2019 0840   CO2 25 06/25/2015 0758   BUN 18 03/07/2019 0840   BUN 16 10/25/2018 1120   BUN 15.9 06/25/2015 0758   CREATININE 1.42 (H) 03/07/2019 0840   CREATININE 1.58 (H) 03/06/2018 0816   CREATININE 1.0 06/25/2015 0758      Component Value Date/Time   CALCIUM 9.3 03/07/2019 0840   CALCIUM 9.3 06/25/2015 0758   ALKPHOS 61 03/07/2019 0840   ALKPHOS 61 06/25/2015 0758   AST 12 (L) 03/07/2019 0840   AST 12 03/06/2018 0816   AST 15 06/25/2015 0758   ALT 12 03/07/2019 0840   ALT 17 03/06/2018 0816   ALT 14 06/25/2015 0758   BILITOT 0.9 03/07/2019 0840   BILITOT 0.8 03/06/2018 0816   BILITOT 0.95 06/25/2015 0758

## 2020-03-09 NOTE — Assessment & Plan Note (Signed)
He has clinical palpable lymphadenopathy which is slightly more than previous visit He has mild anemia and thrombocytopenia which could be due to either CLL or alcohol intake Observe for now Plan to see him back in 6 months for further follow-up

## 2020-03-09 NOTE — Assessment & Plan Note (Signed)
This is likely due to CLL He is not symptomatic I plan to see him in 6 months for further follow-up

## 2020-03-09 NOTE — Assessment & Plan Note (Signed)
Recent CT imaging show no evidence of abdominal disease The pancytopenia is likely due to alcohol intake or CLL We discussed the importance of him staying abstinent from alcohol intake

## 2020-03-10 ENCOUNTER — Telehealth: Payer: Self-pay | Admitting: Hematology and Oncology

## 2020-03-10 NOTE — Telephone Encounter (Signed)
Scheduled per 6/21 sch msg. Called and spoke with pt to confirm 12/21 appts

## 2020-04-06 DIAGNOSIS — Z87442 Personal history of urinary calculi: Secondary | ICD-10-CM | POA: Diagnosis not present

## 2020-04-06 DIAGNOSIS — I129 Hypertensive chronic kidney disease with stage 1 through stage 4 chronic kidney disease, or unspecified chronic kidney disease: Secondary | ICD-10-CM | POA: Diagnosis not present

## 2020-04-06 DIAGNOSIS — E1129 Type 2 diabetes mellitus with other diabetic kidney complication: Secondary | ICD-10-CM | POA: Diagnosis not present

## 2020-04-06 DIAGNOSIS — N390 Urinary tract infection, site not specified: Secondary | ICD-10-CM | POA: Diagnosis not present

## 2020-04-06 DIAGNOSIS — E1122 Type 2 diabetes mellitus with diabetic chronic kidney disease: Secondary | ICD-10-CM | POA: Diagnosis not present

## 2020-04-06 DIAGNOSIS — N400 Enlarged prostate without lower urinary tract symptoms: Secondary | ICD-10-CM | POA: Diagnosis not present

## 2020-04-06 DIAGNOSIS — D61818 Other pancytopenia: Secondary | ICD-10-CM | POA: Diagnosis not present

## 2020-04-06 DIAGNOSIS — N1832 Chronic kidney disease, stage 3b: Secondary | ICD-10-CM | POA: Diagnosis not present

## 2020-04-06 DIAGNOSIS — C9110 Chronic lymphocytic leukemia of B-cell type not having achieved remission: Secondary | ICD-10-CM | POA: Diagnosis not present

## 2020-04-06 DIAGNOSIS — I4892 Unspecified atrial flutter: Secondary | ICD-10-CM | POA: Diagnosis not present

## 2020-04-17 DIAGNOSIS — E782 Mixed hyperlipidemia: Secondary | ICD-10-CM | POA: Diagnosis not present

## 2020-04-17 DIAGNOSIS — N183 Chronic kidney disease, stage 3 unspecified: Secondary | ICD-10-CM | POA: Diagnosis not present

## 2020-04-17 DIAGNOSIS — E1122 Type 2 diabetes mellitus with diabetic chronic kidney disease: Secondary | ICD-10-CM | POA: Diagnosis not present

## 2020-04-17 DIAGNOSIS — N400 Enlarged prostate without lower urinary tract symptoms: Secondary | ICD-10-CM | POA: Diagnosis not present

## 2020-04-17 DIAGNOSIS — I2510 Atherosclerotic heart disease of native coronary artery without angina pectoris: Secondary | ICD-10-CM | POA: Diagnosis not present

## 2020-04-17 DIAGNOSIS — E119 Type 2 diabetes mellitus without complications: Secondary | ICD-10-CM | POA: Diagnosis not present

## 2020-04-17 DIAGNOSIS — I10 Essential (primary) hypertension: Secondary | ICD-10-CM | POA: Diagnosis not present

## 2020-04-17 DIAGNOSIS — E785 Hyperlipidemia, unspecified: Secondary | ICD-10-CM | POA: Diagnosis not present

## 2020-04-17 DIAGNOSIS — E039 Hypothyroidism, unspecified: Secondary | ICD-10-CM | POA: Diagnosis not present

## 2020-04-17 DIAGNOSIS — N182 Chronic kidney disease, stage 2 (mild): Secondary | ICD-10-CM | POA: Diagnosis not present

## 2020-04-21 ENCOUNTER — Other Ambulatory Visit: Payer: Self-pay | Admitting: Internal Medicine

## 2020-04-21 DIAGNOSIS — R590 Localized enlarged lymph nodes: Secondary | ICD-10-CM

## 2020-04-21 DIAGNOSIS — R5383 Other fatigue: Secondary | ICD-10-CM | POA: Diagnosis not present

## 2020-04-21 DIAGNOSIS — L309 Dermatitis, unspecified: Secondary | ICD-10-CM | POA: Diagnosis not present

## 2020-04-21 DIAGNOSIS — N183 Chronic kidney disease, stage 3 unspecified: Secondary | ICD-10-CM | POA: Diagnosis not present

## 2020-04-21 DIAGNOSIS — C9110 Chronic lymphocytic leukemia of B-cell type not having achieved remission: Secondary | ICD-10-CM | POA: Diagnosis not present

## 2020-04-22 ENCOUNTER — Ambulatory Visit
Admission: RE | Admit: 2020-04-22 | Discharge: 2020-04-22 | Disposition: A | Discharge status: Home / Self Care | Payer: Medicare Other | Source: Ambulatory Visit | Attending: Internal Medicine | Admitting: Internal Medicine

## 2020-04-22 DIAGNOSIS — I700 Atherosclerosis of aorta: Secondary | ICD-10-CM | POA: Diagnosis not present

## 2020-04-22 DIAGNOSIS — R590 Localized enlarged lymph nodes: Secondary | ICD-10-CM | POA: Diagnosis not present

## 2020-04-24 ENCOUNTER — Other Ambulatory Visit (HOSPITAL_COMMUNITY): Payer: Self-pay | Admitting: Internal Medicine

## 2020-04-24 DIAGNOSIS — E119 Type 2 diabetes mellitus without complications: Secondary | ICD-10-CM | POA: Diagnosis not present

## 2020-04-24 DIAGNOSIS — N1832 Chronic kidney disease, stage 3b: Secondary | ICD-10-CM

## 2020-04-24 DIAGNOSIS — I10 Essential (primary) hypertension: Secondary | ICD-10-CM | POA: Diagnosis not present

## 2020-04-24 DIAGNOSIS — I2510 Atherosclerotic heart disease of native coronary artery without angina pectoris: Secondary | ICD-10-CM | POA: Diagnosis not present

## 2020-04-24 DIAGNOSIS — E785 Hyperlipidemia, unspecified: Secondary | ICD-10-CM | POA: Diagnosis not present

## 2020-04-24 DIAGNOSIS — N182 Chronic kidney disease, stage 2 (mild): Secondary | ICD-10-CM | POA: Diagnosis not present

## 2020-04-24 DIAGNOSIS — E039 Hypothyroidism, unspecified: Secondary | ICD-10-CM | POA: Diagnosis not present

## 2020-04-24 DIAGNOSIS — E782 Mixed hyperlipidemia: Secondary | ICD-10-CM | POA: Diagnosis not present

## 2020-04-24 DIAGNOSIS — E1122 Type 2 diabetes mellitus with diabetic chronic kidney disease: Secondary | ICD-10-CM | POA: Diagnosis not present

## 2020-04-24 DIAGNOSIS — N183 Chronic kidney disease, stage 3 unspecified: Secondary | ICD-10-CM | POA: Diagnosis not present

## 2020-04-24 DIAGNOSIS — N400 Enlarged prostate without lower urinary tract symptoms: Secondary | ICD-10-CM | POA: Diagnosis not present

## 2020-04-28 ENCOUNTER — Telehealth: Payer: Self-pay

## 2020-04-28 NOTE — Telephone Encounter (Signed)
Called and given below message. He verbalized understanding. Appt scheduled. He is aware of the time and that he can bring a family member with him to appt.

## 2020-04-28 NOTE — Telephone Encounter (Signed)
Called regarding after hours call that he is scheduled for a biopsy on 8/19. His PCP ordered the biopsy. He is complaining of no energy and tiredness all of the time. Denies pain. His lymph nodes are much larger is the reason his PCP ordered the biopsy. Instructed to call the office if needed. He verbalized understanding.

## 2020-04-28 NOTE — Telephone Encounter (Signed)
The biopsy will likely show CLL If he has more symptoms, we can consider treatment Pls schedule him to see me back on 8/23 at 130 pm, 30 mins to discuss results and options

## 2020-05-05 NOTE — H&P (Signed)
Chief Complaint: Patient was seen in consultation today for a random renal biopsy.   Referring Physician(s): Reesa Chew  Supervising Physician: Aletta Edouard  Patient Status: [facility] - Out-pt  History of Present Illness: Derek [name] is a 79 y.o. male with a medical history that includes DM2, CAD, atrial flutter (Eliquis), chronic lymphocytic leukemia and, kidney stones and chronic kidney disease. He underwent a lithotripsy procedure with Urology 11/18/19 for a right kidney stone. He is followed by [facility] and recent lab work shows rising creatinine levels.   Interventional Radiology has been asked to evaluate this patient for an image-guided random renal biopsy for further work-up and evaluation of this patient's chronic kidney disease.   Past Medical History:  Diagnosis Date  . Blood dyscrasia    cll remission  . CLL (chronic lymphocytic leukemia) ([place]) 09/10/2013  . Diabetes mellitus, type II ([place])   . Dilated cardiomyopathy ([place]) 09/14/2018   AFlutter >> EF 40-45 pre DCCV and 45-50 post DCCV in 08/2018 // probable tachycardia mediated.  Marland Kitchen GERD (gastroesophageal reflux disease)    occ tums  . H/O cardiac catheterization   . H/O exercise stress test 1999, 2002  . Hypertension   . Hypertriglyceridemia   . Lymphocytosis   . Obesity   . Pulmonary Function Test 09/2018   PFTs 09/2018:  FEV1 88% predicted; FEV1/FVC 78%; DLCO cor 58    Past Surgical History:  Procedure Laterality Date  . CARDIOVERSION N/A 09/05/2018   Procedure: CARDIOVERSION;  Surgeon: Jerline Pain, MD;  Location[facility] END[facility]COPY;  Service: Cardiovascular;  Laterality: N/A;  . CARDIOVERSION N/A 10/22/2018   Procedure: CARDIOVERSION;  Surgeon: Jerline Pain, MD;  Location: [facility] ENDOSCOPY;  Service: Cardiovascular;  Laterality: N/A;  . CHOLECYSTECTOMY N/A 07/12/2016   Procedure: LAPAROSCOPIC CHOLECYSTECTOMY;  Surgeon: Donnie Mesa, MD;  Location: [place];  Service: General;  Laterality: N/A;   . EXTRACORPOREAL SHOCK WAVE LITHOTRIPSY Right 11/18/2019   Procedure: EXTRACORPOREAL SHOCK WAVE LITHOTRIPSY (ESWL);  Surgeon: Lucas Mallow, MD;  Location: [facility];  Service: Urology;  Laterality: Right;  . FOOT SURGERY Right   . NASAL SINUS SURGERY    . TEE WITHOUT CARDIOVERSION N/A 09/05/2018   Procedure: TRANSESOPHAGEAL ECHOCARDIOGRAM (TEE);  Surgeon: Jerline Pain, MD;  Location: [facility] ENDOSCOPY;  Service: Cardiovascular;  Laterality: N/A;  . TONSILLECTOMY    . UMBILICAL HERNIA REPAIR N/A 07/12/2016   Procedure: [place];  Surgeon: Donnie Mesa, MD;  Location: [place];  Service: General;  Laterality: N/A;  . vericose vein stripping      Allergies: Lipitor [atorvastatin]  Medications: Prior to Admission medications   Medication Sig Start Date End Date Taking? Authorizing Provider  BETA CAROTENE PO Take 1 capsule by mouth daily.   Yes [provider]  ferrous sulfate 325 (65 FE) MG tablet Take 325 mg by mouth daily with breakfast.   Yes [provider]  glimepiride (AMARYL) 2 MG tablet Take 2 mg by mouth in the morning. 02/10/20  Yes [provider]  hydrOXYzine (ATARAX/VISTARIL) 25 MG tablet Take 25 mg by mouth at bedtime. 02/04/20  Yes [provider]  levothyroxine (SYNTHROID) 50 MCG tablet Take 50 mcg by mouth daily before breakfast.  06/17/19  Yes [provider]  metoprolol succinate (TOPROL XL) 25 MG 24 hr tablet Take 0.5 tablets (12.5 mg total) by mouth daily. 10/16/18  Yes Weaver, Scott T, PA-C  rosuvastatin (CRESTOR) 10 MG tablet Take 10 mg by mouth  daily. 02/27/20  Yes [provider]  tamsulosin (FLOMAX) 0.4 MG CAPS capsule Take 0.4 mg by mouth daily. 03/21/20  Yes [provider]  testosterone cypionate (DEPOTESTOSTERONE CYPIONATE) 200 MG/ML injection Inject 200 mg into the muscle every 14 (fourteen) days.  10/15/18  Yes [provider]  amiodarone (PACERONE) 200 MG tablet  Take 1 tablet (200 mg total) by mouth daily. Patient not taking: Reported on 05/01/2020 10/16/18   Richardson Dopp T, PA-C  ELIQUIS 5 MG TABS tablet TAKE 1 TABLET BY MOUTH TWICE A DAY Patient not taking: Reported on 05/01/2020 09/17/19   Jerline Pain, MD  JARDIANCE 10 MG TABS tablet TAKE 1 TABLET BY MOUTH EVERY DAY Patient not taking: Reported on 05/01/2020 09/17/19   Jerline Pain, MD  Omega-3 Fatty Acids (FISH OIL) 1200 MG CAPS Take 2,400 mg by mouth daily.  Patient not taking: Reported on 05/01/2020    [provider]     Family History  Problem Relation Age of Onset  . Heart attack Mother   . Cancer Father        liver    Social History   Socioeconomic History  . Marital status: Married    Spouse name: Not on file  . Number of children: 3  . Years of education: Not on file  . Highest education level: Not on file  Occupational History    Employer: [employer]    Comment: retied sale; asphalt work   Tobacco Use  . Smoking status: Former Smoker    Packs/day: 3.00    Years: 10.00    Pack years: 30.00    Quit date: 09/19/1988    Years since quitting: 31.6  . Smokeless tobacco: Never Used  Vaping Use  . Vaping Use: Never used  Substance and Sexual Activity  . Alcohol use: Yes    Alcohol/week: 14.0 standard drinks    Types: 14 Shots of liquor per week  . Drug use: No  . Sexual activity: Not on file  Other Topics Concern  . Not on file  Social History Narrative  . Not on file   Social Determinants of Health   Financial Resource Strain:   . Difficulty of Paying Living Expenses: Not on file  Food Insecurity:   . Worried About Charity fundraiser in the Last Year: Not on file  . Ran Out of Food in the Last Year: Not on file  Transportation Needs:   . Lack of Transportation (Medical): Not on file  . Lack of Transportation (Non-Medical): Not on file  Physical Activity:   . Days of Exercise per Week: Not on file  . Minutes of Exercise per Session: Not on file    Stress:   . Feeling of Stress : Not on file  Social Connections:   . Frequency of Communication with Friends and Family: Not on file  . Frequency of Social Gatherings with Friends and Family: Not on file  . Attends Religious Services: Not on file  . Active Member of Clubs or Organizations: Not on file  . Attends Archivist Meetings: Not on file  . Marital Status: Not on file    Review of Systems: A 12 point ROS discussed and pertinent positives are indicated in the HPI above.  All other systems are negative.  Review of Systems  Constitutional: Negative for appetite change and fatigue.  Respiratory: Negative for cough and shortness of breath.   Cardiovascular: Negative for chest pain and leg swelling.  Gastrointestinal: Negative for abdominal pain, diarrhea, nausea and vomiting.  Genitourinary: Negative for difficulty urinating.  Musculoskeletal: Negative for back pain.  Neurological: Negative for light-headedness and headaches.    Vital Signs: BP 138/76   Pulse (!) 58   Temp 97.6 F (36.4 C)   Resp 18   Ht 6\' 4"  (1.93 m)   Wt (!) 305 lb (138.3 kg)   SpO2 98%   BMI 37.13 kg/m   Physical Exam Constitutional:      General: He is not in acute distress.    Appearance: He is obese.  HENT:     Mouth/Throat:     Mouth: Mucous membranes are moist.     Pharynx: Oropharynx is clear.  Cardiovascular:     Rate and Rhythm: Normal rate and regular rhythm.     Pulses: Normal pulses.     Heart sounds: Normal heart sounds.  Pulmonary:     Effort: Pulmonary effort is normal.     Breath sounds: Normal breath sounds.  Abdominal:     General: Bowel sounds are normal.     Palpations: Abdomen is soft.  Musculoskeletal:        General: Normal range of motion.  Skin:    General: Skin is warm and dry.  Neurological:     Mental Status: He is alert and oriented to person, place, and time.     Imaging: CT SOFT TISSUE NECK WO CONTRAST  Result Date: 04/22/2020 CLINICAL  DATA:  Adenopathy EXAM: CT NECK WITHOUT CONTRAST TECHNIQUE: Multidetector CT imaging of the neck was performed following the standard protocol without intravenous contrast. COMPARISON:  None. FINDINGS: Pharynx and larynx: Normal. No mass or swelling. Salivary glands: Prominent subcentimeter right intraparotid node measuring 7 mm (3:27). Normal appearance of the left parotid and bilateral submandibular glands. Thyroid: Normal. Lymph nodes: Bilateral enlarged level 1B nodes measuring 1.6 cm on the right and 1.7 cm on the left (3:44, 56). There is mild left perimandibular stranding with prominence of the overlying platysma associated with the dominant left level 1B node (3:44). Enlarged right level 2, 3 and 5 nodes measure up to 1.4 cm (3:39, 63). Enlarged left level 5 a nodes measuring up to 1.6 cm (3:52). Prominent to mildly enlarged upper mediastinal nodes along the tracheoesophageal grooves measure up to 1.2 cm (3:108). Vascular: Atherosclerotic calcifications involving the aortic arch, bilateral carotid siphons and bifurcations. Retropharyngeal course of the right common and bilateral internal carotid arteries. Limited intracranial: No acute abnormalities. Visualized orbits: Partially imaged with no gross abnormality. Mastoids and visualized paranasal sinuses: Minimal bilateral maxillary sinus mucosal thickening. No mastoid effusion. Skeleton: Multilevel spondylosis. No acute or suspicious osseous abnormality. Upper chest: Dependent atelectasis. Other: None. IMPRESSION: 1. Cervical adenopathy involving the bilateral level 1B, right 2/3 and bilateral level 5 nodal stations. 2. Prominent to mildly enlarged upper mediastinal nodes. 3. Prominent subcentimeter right intraparotid node. Aortic Atherosclerosis (ICD10-I70.0). Electronically Signed   By: Primitivo Gauze M.D.   On: 04/22/2020 14:42    Labs:  CBC: Recent Labs    03/09/20 1129  WBC 6.2  HGB 12.9*  HCT 38.4*  PLT 129*    COAGS: No results  for input(s): INR, APTT in the last 8760 hours.  BMP: No results for input(s): NA, K, CL, CO2, GLUCOSE, BUN, CALCIUM, CREATININE, GFRNONAA, GFRAA in the last 8760 hours.  Invalid input(s): CMP  LIVER FUNCTION TESTS: No results for input(s): BILITOT, AST, ALT, ALKPHOS, PROT, ALBUMIN in the last 8760 hours.  TUMOR MARKERS: No results  for input(s): AFPTM, CEA, CA199, CHROMGRNA in the last 8760 hours.  Assessment and Plan:  Chronic kidney disease, stage IIIb: Derek [name]. Derek [name], 79 year old male, presents today to the [facility] department for an image-guided random renal biopsy.   Risks and benefits of a random renal biopsy were discussed with the patient and/or patient's family including, but not limited to bleeding, infection, damage to adjacent structures or low yield requiring additional tests.  All of the questions were answered and there is agreement to proceed.  The patient has been NPO. Vitals have been reviewed. Labs are pending but will be reviewed prior to the start of the procedure. He has held his Eliquis for the appropriate length of time.   Consent signed and in chart.   Thank you for this interesting consult.  I greatly enjoyed meeting Derek [name] and look forward to participating in their care.  A copy of this report was sent to the requesting provider on this date.  Electronically Signed: Soyla Dryer, AGACNP-BC [phone] 05/07/2020, 6:36 AM   I spent a total of  30 Minutes   in face to face in clinical consultation, greater than 50% of which was counseling/coordinating care for image-guided random renal biopsy.

## 2020-05-06 ENCOUNTER — Other Ambulatory Visit: Payer: Self-pay | Admitting: Student

## 2020-05-07 ENCOUNTER — Encounter (HOSPITAL_COMMUNITY): Payer: Self-pay

## 2020-05-07 ENCOUNTER — Ambulatory Visit (HOSPITAL_COMMUNITY)
Admission: RE | Admit: 2020-05-07 | Discharge: 2020-05-07 | Disposition: A | Discharge status: Home / Self Care | Payer: Medicare Other | Source: Ambulatory Visit | Attending: Internal Medicine | Admitting: Internal Medicine

## 2020-05-07 ENCOUNTER — Other Ambulatory Visit: Payer: Self-pay

## 2020-05-07 DIAGNOSIS — N1832 Chronic kidney disease, stage 3b: Secondary | ICD-10-CM | POA: Insufficient documentation

## 2020-05-07 DIAGNOSIS — N189 Chronic kidney disease, unspecified: Secondary | ICD-10-CM | POA: Diagnosis not present

## 2020-05-07 DIAGNOSIS — E1122 Type 2 diabetes mellitus with diabetic chronic kidney disease: Secondary | ICD-10-CM | POA: Diagnosis not present

## 2020-05-07 DIAGNOSIS — Z7901 Long term (current) use of anticoagulants: Secondary | ICD-10-CM | POA: Insufficient documentation

## 2020-05-07 DIAGNOSIS — E669 Obesity, unspecified: Secondary | ICD-10-CM | POA: Diagnosis not present

## 2020-05-07 DIAGNOSIS — K219 Gastro-esophageal reflux disease without esophagitis: Secondary | ICD-10-CM | POA: Insufficient documentation

## 2020-05-07 DIAGNOSIS — Z79899 Other long term (current) drug therapy: Secondary | ICD-10-CM | POA: Diagnosis not present

## 2020-05-07 DIAGNOSIS — I2510 Atherosclerotic heart disease of native coronary artery without angina pectoris: Secondary | ICD-10-CM | POA: Insufficient documentation

## 2020-05-07 DIAGNOSIS — Z79890 Hormone replacement therapy (postmenopausal): Secondary | ICD-10-CM | POA: Insufficient documentation

## 2020-05-07 DIAGNOSIS — N289 Disorder of kidney and ureter, unspecified: Secondary | ICD-10-CM | POA: Diagnosis not present

## 2020-05-07 DIAGNOSIS — I4892 Unspecified atrial flutter: Secondary | ICD-10-CM | POA: Diagnosis not present

## 2020-05-07 DIAGNOSIS — C9110 Chronic lymphocytic leukemia of B-cell type not having achieved remission: Secondary | ICD-10-CM | POA: Diagnosis not present

## 2020-05-07 DIAGNOSIS — Z7984 Long term (current) use of oral hypoglycemic drugs: Secondary | ICD-10-CM | POA: Insufficient documentation

## 2020-05-07 DIAGNOSIS — Z6837 Body mass index (BMI) 37.0-37.9, adult: Secondary | ICD-10-CM | POA: Diagnosis not present

## 2020-05-07 DIAGNOSIS — Z87891 Personal history of nicotine dependence: Secondary | ICD-10-CM | POA: Diagnosis not present

## 2020-05-07 DIAGNOSIS — I420 Dilated cardiomyopathy: Secondary | ICD-10-CM | POA: Insufficient documentation

## 2020-05-07 DIAGNOSIS — I129 Hypertensive chronic kidney disease with stage 1 through stage 4 chronic kidney disease, or unspecified chronic kidney disease: Secondary | ICD-10-CM | POA: Insufficient documentation

## 2020-05-07 DIAGNOSIS — E781 Pure hyperglyceridemia: Secondary | ICD-10-CM | POA: Insufficient documentation

## 2020-05-07 LAB — PROTIME-INR
INR: 1.1 (ref 0.8–1.2)
Prothrombin Time: 13.6 seconds (ref 11.4–15.2)

## 2020-05-07 LAB — GLUCOSE, CAPILLARY: Glucose-Capillary: 122 mg/dL — ABNORMAL HIGH (ref 70–99)

## 2020-05-07 LAB — CBC
HCT: 39.7 % (ref 39.0–52.0)
Hemoglobin: 13.3 g/dL (ref 13.0–17.0)
MCH: 30.6 pg (ref 26.0–34.0)
MCHC: 33.5 g/dL (ref 30.0–36.0)
MCV: 91.3 fL (ref 80.0–100.0)
Platelets: 155 K/uL (ref 150–400)
RBC: 4.35 MIL/uL (ref 4.22–5.81)
RDW: 13.7 % (ref 11.5–15.5)
WBC: 7.2 K/uL (ref 4.0–10.5)
nRBC: 0 % (ref 0.0–0.2)

## 2020-05-07 MED ORDER — MIDAZOLAM HCL 2 MG/2ML IJ SOLN
INTRAMUSCULAR | Status: AC | PRN
  Administered 2020-05-07: 0.5 mg via INTRAVENOUS
  Administered 2020-05-07: 1 mg via INTRAVENOUS

## 2020-05-07 MED ORDER — FENTANYL CITRATE (PF) 100 MCG/2ML IJ SOLN
INTRAMUSCULAR | Status: AC
  Filled 2020-05-07: qty 2

## 2020-05-07 MED ORDER — MIDAZOLAM HCL 2 MG/2ML IJ SOLN
INTRAMUSCULAR | Status: AC
  Filled 2020-05-07: qty 2

## 2020-05-07 MED ORDER — FENTANYL CITRATE (PF) 100 MCG/2ML IJ SOLN
INTRAMUSCULAR | Status: AC | PRN
  Administered 2020-05-07: 50 ug via INTRAVENOUS
  Administered 2020-05-07: 25 ug via INTRAVENOUS

## 2020-05-07 MED ORDER — SODIUM CHLORIDE 0.9 % IV SOLN: INTRAVENOUS | Status: DC

## 2020-05-07 MED ORDER — LIDOCAINE HCL (PF) 1 % IJ SOLN
INTRAMUSCULAR | Status: AC
  Administered 2020-05-07: 20 mL
  Filled 2020-05-07: qty 30

## 2020-05-07 MED ORDER — GELATIN ABSORBABLE 12-7 MM EX MISC
CUTANEOUS | Status: AC
  Filled 2020-05-07: qty 1

## 2020-05-07 NOTE — Procedures (Signed)
Interventional Radiology Procedure Note ° °Procedure: US Guided Biopsy of right kidney ° °Complications: None ° °Estimated Blood Loss: < 10 mL ° °Findings: °16 G core biopsy of right renal cortex performed under US guidance.  Two core samples obtained and sent to Pathology. ° °Araseli Sherry T. Honorio Devol, M.D °Pager:  [phone] ° °  °

## 2020-05-07 NOTE — Discharge Instructions (Signed)
Percutaneous Kidney Biopsy, Care After This sheet gives you information about how to care for yourself after your procedure. Your health care provider may also give you more specific instructions. If you have problems or questions, contact your health care provider. What can I expect after the procedure? After the procedure, it is common to have:  Pain or soreness near the biopsy site.  Pink or cloudy urine for 24 hours after the procedure. Follow these instructions at home: Activity  Return to your normal activities as told by your health care provider. Ask your health care provider what activities are safe for you.  If you were given a sedative during the procedure, it can affect you for several hours. Do not drive or operate machinery until your health care provider says that it is safe.  Do not lift anything that is heavier than 10 lb (4.5 kg), or the limit that you are told, until your health care provider says that it is safe.  Avoid activities that take a lot of effort (are strenuous) until your health care provider approves. Most people will have to wait 2 weeks before returning to activities such as exercise or sex. General instructions   Take over-the-counter and prescription medicines only as told by your health care provider.  You may eat and drink after your procedure. Follow instructions from your health care provider about eating or drinking restrictions.  Check your biopsy site every day for signs of infection. Check for: ? More redness, swelling, or pain. ? Fluid or blood. ? Warmth. ? Pus or a bad smell.  Keep all follow-up visits as told by your health care provider. This is important. Contact a health care provider if:  You have more redness, swelling, or pain around your biopsy site.  You have fluid or blood coming from your biopsy site.  Your biopsy site feels warm to the touch.  You have pus or a bad smell coming from your biopsy site.  You have blood  in your urine more than 24 hours after your procedure.  You have a fever. Get help right away if:  Your urine is dark red or brown.  You cannot urinate.  It burns when you urinate.  You feel dizzy or light-headed.  You have severe pain in your abdomen or side. Summary  After the procedure, it is common to have pain or soreness at the biopsy site and pink or cloudy urine for the first 24 hours.  Check your biopsy site each day for signs of infection, such as more redness, swelling, or pain; fluid, blood, pus or a bad smell coming from the biopsy site; or the biopsy site feeling warm to touch.  Return to your normal activities as told by your health care provider. This information is not intended to replace advice given to you by your health care provider. Make sure you discuss any questions you have with your health care provider. Document Revised: 05/09/2019 Document Reviewed: 05/09/2019 Elsevier Patient Education  2020 [employer]  

## 2020-05-11 ENCOUNTER — Telehealth: Payer: Self-pay

## 2020-05-11 ENCOUNTER — Inpatient Hospital Stay: Payer: Medicare Other | Admitting: Hematology and Oncology

## 2020-05-11 DIAGNOSIS — Z856 Personal history of leukemia: Secondary | ICD-10-CM | POA: Diagnosis not present

## 2020-05-11 DIAGNOSIS — H6123 Impacted cerumen, bilateral: Secondary | ICD-10-CM | POA: Diagnosis not present

## 2020-05-11 DIAGNOSIS — R590 Localized enlarged lymph nodes: Secondary | ICD-10-CM | POA: Diagnosis not present

## 2020-05-11 NOTE — Telephone Encounter (Signed)
Called and canceled today's appt due to pathology results not resulted. Rescheduled to 8/26 at 1 pm. He is aware of the time/ date.

## 2020-05-14 ENCOUNTER — Other Ambulatory Visit: Payer: Self-pay

## 2020-05-14 ENCOUNTER — Encounter: Payer: Self-pay | Admitting: Hematology and Oncology

## 2020-05-14 ENCOUNTER — Inpatient Hospital Stay: Payer: Medicare Other | Attending: Hematology and Oncology | Admitting: Hematology and Oncology

## 2020-05-14 VITALS — BP 154/62 | HR 58 | Temp 97.4°F | Resp 18 | Ht 76.0 in | Wt 314.4 lb

## 2020-05-14 DIAGNOSIS — E1122 Type 2 diabetes mellitus with diabetic chronic kidney disease: Secondary | ICD-10-CM | POA: Diagnosis not present

## 2020-05-14 DIAGNOSIS — Z7189 Other specified counseling: Secondary | ICD-10-CM

## 2020-05-14 DIAGNOSIS — R1110 Vomiting, unspecified: Secondary | ICD-10-CM | POA: Diagnosis not present

## 2020-05-14 DIAGNOSIS — R590 Localized enlarged lymph nodes: Secondary | ICD-10-CM | POA: Diagnosis not present

## 2020-05-14 DIAGNOSIS — L659 Nonscarring hair loss, unspecified: Secondary | ICD-10-CM | POA: Insufficient documentation

## 2020-05-14 DIAGNOSIS — E669 Obesity, unspecified: Secondary | ICD-10-CM | POA: Diagnosis not present

## 2020-05-14 DIAGNOSIS — Z7901 Long term (current) use of anticoagulants: Secondary | ICD-10-CM | POA: Insufficient documentation

## 2020-05-14 DIAGNOSIS — C9110 Chronic lymphocytic leukemia of B-cell type not having achieved remission: Secondary | ICD-10-CM | POA: Diagnosis not present

## 2020-05-14 DIAGNOSIS — Z79899 Other long term (current) drug therapy: Secondary | ICD-10-CM | POA: Diagnosis not present

## 2020-05-14 DIAGNOSIS — I700 Atherosclerosis of aorta: Secondary | ICD-10-CM | POA: Insufficient documentation

## 2020-05-14 DIAGNOSIS — Z299 Encounter for prophylactic measures, unspecified: Secondary | ICD-10-CM | POA: Diagnosis not present

## 2020-05-14 DIAGNOSIS — N189 Chronic kidney disease, unspecified: Secondary | ICD-10-CM | POA: Insufficient documentation

## 2020-05-14 MED ORDER — ONDANSETRON HCL 8 MG PO TABS: 8.0000 mg | ORAL_TABLET | Freq: Three times a day (TID) | ORAL | 1 refills | Status: DC | PRN

## 2020-05-14 MED ORDER — ACYCLOVIR 400 MG PO TABS: 400.0000 mg | ORAL_TABLET | Freq: Every day | ORAL | 5 refills | Status: DC

## 2020-05-14 MED ORDER — PROCHLORPERAZINE MALEATE 10 MG PO TABS: 10.0000 mg | ORAL_TABLET | Freq: Four times a day (QID) | ORAL | 1 refills | Status: DC | PRN

## 2020-05-14 MED ORDER — ALLOPURINOL 300 MG PO TABS: 300.0000 mg | ORAL_TABLET | Freq: Every day | ORAL | 0 refills | Status: DC

## 2020-05-14 MED ORDER — ALLOPURINOL 300 MG PO TABS: 300.0000 mg | ORAL_TABLET | Freq: Every day | ORAL | 3 refills | Status: DC

## 2020-05-14 MED ORDER — ACYCLOVIR 400 MG PO TABS: 400.0000 mg | ORAL_TABLET | Freq: Every day | ORAL | 3 refills | Status: DC

## 2020-05-14 NOTE — Assessment & Plan Note (Signed)
He is experience continuous growth of lymph nodes on his neck and is getting slighlt uncomfortable Examination confirmed slight growth compared to prior visits I review his recent Ct imaging I recommend we resume chemo treatment We discussed options. Standard of care involves BTK inhibitors. He does not want to take pills and prefers infusional therapy  We discussed the role of chemotherapy is of palliative intent The decision was made based on publication in the Blood: Randomized trial of bendamustine-rituximab or R-CHOP/R-CVP in first-line treatment of indolent NHL or MCL: the BRIGHT study.  [place] [address] Lucianne Lei der [address] [place] BS, [address] M, Kwan YL, Simpson D, Craig M, Kolibaba K, Issa S, [place], [place] DM, Munteanu M, Aram Beecham JM SO  Blood. 2014;123(19):2944.  The chemotherapy consists of   1. Bendamustine at 90 mg/m2 on day 1 & 2 2. Rituximab at 375 mg/m2 on day 1 Each cycle + 28 days  Plan for 6 cycles total with or without Neulasta support  In the international phase III BRIGHT trial, 447 previously untreated patients with advanced stage follicular (n = 920 patients), mantle cell (n = 74 patients), or other indolent lymphoma were randomly assigned to six cycles of BR according to the same dose and schedule described above or to R-CHOP or R-CVP. BR resulted in similar complete (31 versus 25 percent) and overall (97 versus 91 percent) response rates. BR was associated with higher rates of vomiting and drug hypersensitivity and lower rates of peripheral neuropathy/paresthesia and alopecia. The use of prophylactic antiemetics was not specified in the protocol and was more common among patients assigned to R-CHOP.   We discussed some of the risks, benefits and side-effects of Rituximab with Bendamustine.   Some of the short term side-effects included, though not limited to, risk of fatigue, weight loss, tumor lysis syndrome, risk of allergic  reactions, pancytopenia, life-threatening infections, need for transfusions of blood products, nausea, vomiting, change in bowel habits, admission to hospital for various reasons, and risks of death.   Long term side-effects are also discussed including permanent damage to nerve function, chronic fatigue, and rare secondary malignancy including bone marrow disorders.   The patient is aware that the response rates discussed earlier is not guaranteed.    After a long discussion, patient made an informed decision to proceed with the prescribed plan of care.   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

## 2020-05-14 NOTE — Progress Notes (Signed)
START OFF PATHWAY REGIMEN - Lymphoma and CLL   [id number]:Bendamustine + Rituximab (70/500) q28 Days:   A cycle is every 28 days:     Bendamustine      Rituximab-xxxx      Rituximab-xxxx   **Always confirm dose/schedule in your pharmacy ordering system**  Patient Characteristics: Chronic Lymphocytic Leukemia (CLL), Treatment Indicated, Second Line Disease Type: Chronic Lymphocytic Leukemia (CLL) Disease Type: Not Applicable Disease Type: Not Applicable Treatment Indicated<= Treatment Indicated Line of Therapy: Second Line Intent of Therapy: Non-Curative / Palliative Intent, Discussed with Patient

## 2020-05-14 NOTE — Progress Notes (Signed)
[name] OFF[facility] PROGRESS NOTE  Patient Care Team: Wenda Low, MD as PCP - General (Internal Medicine) Jerline Pain, MD as PCP - Cardiology (Cardiology) Jerline Pain, MD as Attending Physician (Cardiology)  ASSESSMENT & PLAN:  CLL (chronic lymphocytic leukemia) He is experience continuous growth of lymph nodes on his neck and is getting slighlt uncomfortable Examination confirmed slight growth compared to prior visits I review his recent Ct imaging I recommend we resume chemo treatment We discussed options. Standard of care involves BTK inhibitors. He does not want to take pills and prefers infusional therapy  We discussed the role of chemotherapy is of palliative intent The decision was made based on publication in the Blood: Randomized trial of bendamustine-rituximab or R-CHOP/R-CVP in first-line treatment of indolent NHL or MCL: the BRIGHT study.  [place] [address] Lucianne Lei der [address] [place] BS, [address] M, Kwan YL, Simpson D, Craig M, Kolibaba K, Issa S, [place], [place] DM, Munteanu M, Aram Beecham JM SO  Blood. 2014;123(19):2944.  The chemotherapy consists of   1. Bendamustine at 90 mg/m2 on day 1 & 2 2. Rituximab at 375 mg/m2 on day 1 Each cycle + 28 days  Plan for 6 cycles total with or without Neulasta support  In the international phase III BRIGHT trial, 447 previously untreated patients with advanced stage follicular (n = 867 patients), mantle cell (n = 74 patients), or other indolent lymphoma were randomly assigned to six cycles of BR according to the same dose and schedule described above or to R-CHOP or R-CVP. BR resulted in similar complete (31 versus 25 percent) and overall (97 versus 91 percent) response rates. BR was associated with higher rates of vomiting and drug hypersensitivity and lower rates of peripheral neuropathy/paresthesia and alopecia. The use of prophylactic antiemetics was not specified in the protocol  and was more common among patients assigned to R-CHOP.   We discussed some of the risks, benefits and side-effects of Rituximab with Bendamustine.   Some of the short term side-effects included, though not limited to, risk of fatigue, weight loss, tumor lysis syndrome, risk of allergic reactions, pancytopenia, life-threatening infections, need for transfusions of blood products, nausea, vomiting, change in bowel habits, admission to hospital for various reasons, and risks of death.   Long term side-effects are also discussed including permanent damage to nerve function, chronic fatigue, and rare secondary malignancy including bone marrow disorders.   The patient is aware that the response rates discussed earlier is not guaranteed.    After a long discussion, patient made an informed decision to proceed with the prescribed plan of care.   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 He does not have any prior allergic reactions to Covid-19 vaccination. He agrees to proceed with booster dose of Covid-19 vaccination in his next visit and we will administer it at the clinic.    Orders Placed This Encounter  Procedures  . CBC with Differential (Cancer Center Only)    Standing Status:   Standing  Number of Occurrences:   20    Standing Expiration Date:   05/14/2021  . CMP ([facility] only)    Standing Status:   Standing    Number of Occurrences:   20    Standing Expiration Date:   05/14/2021  . Hepatitis B surface antigen    Standing Status:    Standing    Number of Occurrences:   1    Standing Expiration Date:   05/14/2021  . Hepatitis B core antibody, total    Standing Status:   Standing    Number of Occurrences:   1    Standing Expiration Date:   05/14/2021  . Uric acid    Standing Status:   Standing    Number of Occurrences:   9    Standing Expiration Date:   05/14/2021  . PHYSICIAN COMMUNICATION ORDER    Hepatitis B Virus screening with HBsAg and anti-HBc recommended prior to treatment with rituximab, ofatumumab, or obinutuzumab.    All questions were answered. The patient knows to call the clinic with any problems, questions or concerns. The total time spent in the appointment was 40 minutes encounter with patients including review of chart and various tests results, discussions about plan of care and coordination of care plan   [name], MD 05/14/2020 3:04 PM  INTERVAL HISTORY: Please see below for problem oriented charting. He is seen urgently due to enlarging LN since our last visit No recent infection or skin lesions around the head and neck region  SUMMARY OF ONCOLOGIC HISTORY: Oncology History Overview Note  Normal FISH   CLL (chronic lymphocytic leukemia) ([place])  09/10/2013 Initial Diagnosis   CLL (chronic lymphocytic leukemia)   12/26/2013 Pathology Results   FISH analysis was normal.   06/17/2014 Procedure   The patient has placement of Infuse-a-Port.   06/18/2014 Imaging   CT scan of the chest, abdomen and pelvis showed diffuse lymphadenopathy and splenomegaly.   06/19/2014 Bone Marrow Biopsy   Bone marrow aspirate and biopsy show CLL.   06/24/2014 - 11/18/2014 Chemotherapy   He received 6 cycles of Obinutuzumab and chlorambucil   09/15/2014 Imaging   Repeat CT scan of the chest, abdomen and pelvis show greater than 50% reduction in lymphadenopathy and splenomegaly   12/19/2014 Imaging   Repeat CT scan showed complete resolution of lymphadenopathy and splenomegaly.   03/09/2020 Cancer Staging    Staging form: Chronic Lymphocytic Leukemia / Small Lymphocytic Lymphoma, AJCC 8th Edition - Clinical stage from 03/09/2020: Modified Rai Stage I (Modified Rai risk: Intermediate, Lymphocytosis: Present, Adenopathy: Present, Organomegaly: Absent, Anemia: Absent, Thrombocytopenia: Absent) - Signed by [name], MD on 03/09/2020   04/22/2020 Imaging   CT neck 1. Cervical adenopathy involving the bilateral level 1B, right 2/3 and bilateral level 5 nodal stations. 2. Prominent to mildly enlarged upper mediastinal nodes. 3. Prominent subcentimeter right intraparotid node.       REVIEW OF SYSTEMS:   Constitutional: Denies fevers, chills or abnormal weight loss Eyes: Denies blurriness of vision Ears, nose, mouth, throat, and face: Denies mucositis or sore throat Respiratory: Denies cough, dyspnea or wheezes Cardiovascular: Denies palpitation, chest discomfort or lower extremity swelling Gastrointestinal:  Denies nausea, heartburn or change in bowel habits Skin: Denies abnormal skin rashes Neurological:Denies numbness, tingling or new weaknesses Behavioral/Psych: Mood is stable, no new changes  All other systems were reviewed with the patient and are negative.  I have reviewed the past medical history, past surgical history, social history and family history with the  patient and they are unchanged from previous note.  ALLERGIES:  is allergic to lipitor [atorvastatin].  MEDICATIONS:  Current Outpatient Medications  Medication Sig Dispense Refill  . acyclovir (ZOVIRAX) 400 MG tablet Take 1 tablet (400 mg total) by mouth daily. 30 tablet 3  . allopurinol (ZYLOPRIM) 300 MG tablet Take 1 tablet (300 mg total) by mouth daily. 30 tablet 3  . amiodarone (PACERONE) 200 MG tablet Take 1 tablet (200 mg total) by mouth daily. (Patient not taking: Reported on 05/01/2020) 90 tablet 3  . BETA CAROTENE PO Take 1 capsule by mouth daily.    Marland Kitchen ELIQUIS 5 MG TABS tablet TAKE 1 TABLET BY MOUTH TWICE A DAY (Patient  not taking: Reported on 05/01/2020) 60 tablet 5  . ferrous sulfate 325 (65 FE) MG tablet Take 325 mg by mouth daily with breakfast.    . glimepiride (AMARYL) 2 MG tablet Take 2 mg by mouth in the morning.    . hydrOXYzine (ATARAX/VISTARIL) 25 MG tablet Take 25 mg by mouth at bedtime.    Marland Kitchen JARDIANCE 10 MG TABS tablet TAKE 1 TABLET BY MOUTH EVERY DAY (Patient not taking: Reported on 05/01/2020) 90 mg 3  . levothyroxine (SYNTHROID) 50 MCG tablet Take 50 mcg by mouth daily before breakfast.     . metoprolol succinate (TOPROL XL) 25 MG 24 hr tablet Take 0.5 tablets (12.5 mg total) by mouth daily. 45 tablet 3  . Omega-3 Fatty Acids (FISH OIL) 1200 MG CAPS Take 2,400 mg by mouth daily.  (Patient not taking: Reported on 05/01/2020)    . ondansetron (ZOFRAN) 8 MG tablet Take 1 tablet (8 mg total) by mouth every 8 (eight) hours as needed for refractory nausea / vomiting. Start on day 2 after bendamustine chemo. 30 tablet 1  . prochlorperazine (COMPAZINE) 10 MG tablet Take 1 tablet (10 mg total) by mouth every 6 (six) hours as needed (Nausea or vomiting). 30 tablet 1  . rosuvastatin (CRESTOR) 10 MG tablet Take 10 mg by mouth daily.    . tamsulosin (FLOMAX) 0.4 MG CAPS capsule Take 0.4 mg by mouth daily.    Marland Kitchen testosterone cypionate (DEPOTESTOSTERONE CYPIONATE) 200 MG/ML injection Inject 200 mg into the muscle every 14 (fourteen) days.      No current facility-administered medications for this visit.    PHYSICAL EXAMINATION: ECOG PERFORMANCE STATUS: 1 - Symptomatic but completely ambulatory  Vitals:   05/14/20 1308  BP: (!) 154/62  Pulse: (!) 58  Resp: 18  Temp: (!) 97.4 F (36.3 C)  SpO2: 96%   Filed Weights   05/14/20 1308  Weight: (!) 314 lb 6.4 oz (142.6 kg)    GENERAL:alert, no distress and comfortable SKIN: skin color, texture, turgor are normal, no rashes or significant lesions EYES: normal, Conjunctiva are pink and non-injected, sclera clear OROPHARYNX:no exudate, no erythema and lips,  buccal mucosa, and tongue normal  NECK: He has palpable enlarged LN on both sides of his neck LUNGS: clear to auscultation and percussion with normal breathing effort HEART: regular rate & rhythm and no murmurs and no lower extremity edema ABDOMEN:abdomen soft, non-tender and normal bowel sounds Musculoskeletal:no cyanosis of digits and no clubbing  NEURO: alert & oriented x 3 with fluent speech, no focal motor/sensory deficits  LABORATORY DATA:  I have reviewed the data as listed    Component Value Date/Time   NA 136 03/07/2019 0840   NA 138 10/25/2018 1120   NA 140 06/25/2015 0758   K 4.3 03/07/2019 0840  K 3.9 06/25/2015 0758   CL 104 03/07/2019 0840   CL 101 03/11/2013 1031   CO2 21 (L) 03/07/2019 0840   CO2 25 06/25/2015 0758   GLUCOSE 166 (H) 03/07/2019 0840   GLUCOSE 140 06/25/2015 0758   GLUCOSE 173 (H) 03/11/2013 1031   BUN 18 03/07/2019 0840   BUN 16 10/25/2018 1120   BUN 15.9 06/25/2015 0758   CREATININE 1.42 (H) 03/07/2019 0840   CREATININE 1.58 (H) 03/06/2018 0816   CREATININE 1.0 06/25/2015 0758   CALCIUM 9.3 03/07/2019 0840   CALCIUM 9.3 06/25/2015 0758   PROT 7.0 03/07/2019 0840   PROT 6.7 06/25/2015 0758   ALBUMIN 3.7 03/07/2019 0840   ALBUMIN 3.8 06/25/2015 0758   AST 12 (L) 03/07/2019 0840   AST 12 03/06/2018 0816   AST 15 06/25/2015 0758   ALT 12 03/07/2019 0840   ALT 17 03/06/2018 0816   ALT 14 06/25/2015 0758   ALKPHOS 61 03/07/2019 0840   ALKPHOS 61 06/25/2015 0758   BILITOT 0.9 03/07/2019 0840   BILITOT 0.8 03/06/2018 0816   BILITOT 0.95 06/25/2015 0758   GFRNONAA 48 (L) 03/07/2019 0840   GFRNONAA 41 (L) 03/06/2018 0816   GFRAA 55 (L) 03/07/2019 0840   GFRAA 48 (L) 03/06/2018 0816    No results found for: SPEP, UPEP  Lab Results  Component Value Date   WBC 7.2 05/07/2020   NEUTROABS 4.3 03/09/2020   HGB 13.3 05/07/2020   HCT 39.7 05/07/2020   MCV 91.3 05/07/2020   PLT 155 05/07/2020      Chemistry      Component Value  Date/Time   NA 136 03/07/2019 0840   NA 138 10/25/2018 1120   NA 140 06/25/2015 0758   K 4.3 03/07/2019 0840   K 3.9 06/25/2015 0758   CL 104 03/07/2019 0840   CL 101 03/11/2013 1031   CO2 21 (L) 03/07/2019 0840   CO2 25 06/25/2015 0758   BUN 18 03/07/2019 0840   BUN 16 10/25/2018 1120   BUN 15.9 06/25/2015 0758   CREATININE 1.42 (H) 03/07/2019 0840   CREATININE 1.58 (H) 03/06/2018 0816   CREATININE 1.0 06/25/2015 0758      Component Value Date/Time   CALCIUM 9.3 03/07/2019 0840   CALCIUM 9.3 06/25/2015 0758   ALKPHOS 61 03/07/2019 0840   ALKPHOS 61 06/25/2015 0758   AST 12 (L) 03/07/2019 0840   AST 12 03/06/2018 0816   AST 15 06/25/2015 0758   ALT 12 03/07/2019 0840   ALT 17 03/06/2018 0816   ALT 14 06/25/2015 0758   BILITOT 0.9 03/07/2019 0840   BILITOT 0.8 03/06/2018 0816   BILITOT 0.95 06/25/2015 0758       RADIOGRAPHIC STUDIES: I have personally reviewed the radiological images as listed and agreed with the findings in the report. CT SOFT TISSUE NECK WO CONTRAST  Result Date: 04/22/2020 CLINICAL DATA:  Adenopathy EXAM: CT NECK WITHOUT CONTRAST TECHNIQUE: Multidetector CT imaging of the neck was performed following the standard protocol without intravenous contrast. COMPARISON:  None. FINDINGS: Pharynx and larynx: Normal. No mass or swelling. Salivary glands: Prominent subcentimeter right intraparotid node measuring 7 mm (3:27). Normal appearance of the left parotid and bilateral submandibular glands. Thyroid: Normal. Lymph nodes: Bilateral enlarged level 1B nodes measuring 1.6 cm on the right and 1.7 cm on the left (3:44, 56). There is mild left perimandibular stranding with prominence of the overlying platysma associated with the dominant left level 1B node (3:44). Enlarged right level 2,  3 and 5 nodes measure up to 1.4 cm (3:39, 63). Enlarged left level 5 a nodes measuring up to 1.6 cm (3:52). Prominent to mildly enlarged upper mediastinal nodes along the  tracheoesophageal grooves measure up to 1.2 cm (3:108). Vascular: Atherosclerotic calcifications involving the aortic arch, bilateral carotid siphons and bifurcations. Retropharyngeal course of the right common and bilateral internal carotid arteries. Limited intracranial: No acute abnormalities. Visualized orbits: Partially imaged with no gross abnormality. Mastoids and visualized paranasal sinuses: Minimal bilateral maxillary sinus mucosal thickening. No mastoid effusion. Skeleton: Multilevel spondylosis. No acute or suspicious osseous abnormality. Upper chest: Dependent atelectasis. Other: None. IMPRESSION: 1. Cervical adenopathy involving the bilateral level 1B, right 2/3 and bilateral level 5 nodal stations. 2. Prominent to mildly enlarged upper mediastinal nodes. 3. Prominent subcentimeter right intraparotid node. Aortic Atherosclerosis (ICD10-I70.0). Electronically Signed   By: Primitivo Gauze M.D.   On: 04/22/2020 14:42   US BIOPSY (KIDNEY)  Result Date: 05/07/2020 INDICATION: Chronic kidney disease, diabetes and worsening renal insufficiency. EXAM: ULTRASOUND GUIDED CORE BIOPSY OF RIGHT KIDNEY MEDICATIONS: None. ANESTHESIA/SEDATION: Fentanyl 75 mcg IV; Versed 1.5 mg IV Moderate Sedation Time:  22 minutes. The patient was continuously monitored during the procedure by the interventional radiology nurse under my direct supervision. PROCEDURE: The procedure, risks, benefits, and alternatives were explained to the patient. Questions regarding the procedure were encouraged and answered. The patient understands and consents to the procedure. A time-out was performed prior to initiating the procedure. Imaging of the kidneys was performed by ultrasound in a prone position. The right flank region was prepped with chlorhexidine in a sterile fashion, and a sterile drape was applied covering the operative field. A sterile gown and sterile gloves were used for the procedure. Local anesthesia was provided with 1%  Lidocaine. Under ultrasound guidance, a 15 gauge trocar needle was advanced to the margin of right lower pole renal cortex. Coaxial 16 gauge core biopsy samples were obtained. Two separate samples were submitted in saline. A slurry of Gel-Foam pledgets were injected through the outer needle as the needle was retracted and removed. Additional ultrasound was performed. COMPLICATIONS: None immediate. FINDINGS: Both kidneys are visualized by ultrasound. The right was better localized by ultrasound and more superficial from a posterior approach compared to the left. Solid core biopsy samples were obtained. IMPRESSION: Ultrasound-guided core biopsy performed of the right kidney at the level of lower pole cortex. Electronically Signed   By: Aletta Edouard M.D.   On: 05/07/2020 10:35

## 2020-05-14 NOTE — Assessment & Plan Note (Signed)
We discussed the importance of preventive care and reviewed the vaccination programs. He does not have any prior allergic reactions to Covid-19 vaccination. He agrees to proceed with booster dose of Covid-19 vaccination in his next visit and we will administer it at the clinic.

## 2020-05-14 NOTE — Assessment & Plan Note (Signed)
He is aware treatment is palliative

## 2020-05-22 DIAGNOSIS — E1122 Type 2 diabetes mellitus with diabetic chronic kidney disease: Secondary | ICD-10-CM | POA: Diagnosis not present

## 2020-05-22 DIAGNOSIS — N400 Enlarged prostate without lower urinary tract symptoms: Secondary | ICD-10-CM | POA: Diagnosis not present

## 2020-05-22 DIAGNOSIS — N183 Chronic kidney disease, stage 3 unspecified: Secondary | ICD-10-CM | POA: Diagnosis not present

## 2020-05-22 DIAGNOSIS — E039 Hypothyroidism, unspecified: Secondary | ICD-10-CM | POA: Diagnosis not present

## 2020-05-22 DIAGNOSIS — I2510 Atherosclerotic heart disease of native coronary artery without angina pectoris: Secondary | ICD-10-CM | POA: Diagnosis not present

## 2020-05-22 DIAGNOSIS — E119 Type 2 diabetes mellitus without complications: Secondary | ICD-10-CM | POA: Diagnosis not present

## 2020-05-22 DIAGNOSIS — N182 Chronic kidney disease, stage 2 (mild): Secondary | ICD-10-CM | POA: Diagnosis not present

## 2020-05-22 DIAGNOSIS — E782 Mixed hyperlipidemia: Secondary | ICD-10-CM | POA: Diagnosis not present

## 2020-05-22 DIAGNOSIS — I10 Essential (primary) hypertension: Secondary | ICD-10-CM | POA: Diagnosis not present

## 2020-05-22 DIAGNOSIS — E785 Hyperlipidemia, unspecified: Secondary | ICD-10-CM | POA: Diagnosis not present

## 2020-05-26 ENCOUNTER — Encounter (HOSPITAL_COMMUNITY): Payer: Self-pay

## 2020-05-29 NOTE — Progress Notes (Signed)
Pharmacist Chemotherapy Monitoring - Initial Assessment    Anticipated start date: 06/03/20   Regimen:  . Are orders appropriate based on the patient's diagnosis, regimen, and cycle? Yes . Does the plan date match the patient's scheduled date? Yes . Is the sequencing of drugs appropriate? Yes . Are the premedications appropriate for the patient's regimen? Yes . Prior Authorization for treatment is: Approved o If applicable, is the correct biosimilar selected based on the patient's insurance? yes  Organ Function and Labs: Marland Kitchen Are dose adjustments needed based on the patient's renal function, hepatic function, or hematologic function? Yes . Are appropriate labs ordered prior to the start of patient's treatment? Yes . Other organ system assessment, if indicated: N/A . The following baseline labs, if indicated, have been ordered: rituximab: baseline Hepatitis B labs  Dose Assessment: . Are the drug doses appropriate? Yes . Are the following correct: o Drug concentrations Yes o IV fluid compatible with drug Yes o Administration routes Yes o Timing of therapy Yes . If applicable, does the patient have documented access for treatment and/or plans for port-a-cath placement? no . If applicable, have lifetime cumulative doses been properly documented and assessed? yes Lifetime Dose Tracking  No doses have been documented on this patient for the following tracked chemicals: Doxorubicin, Epirubicin, Idarubicin, Daunorubicin, Mitoxantrone, Bleomycin, Oxaliplatin, Carboplatin, Liposomal Doxorubicin  o   Toxicity Monitoring/Prevention: . The patient has the following take home antiemetics prescribed: Ondansetron and Prochlorperazine . The patient has the following take home medications prescribed: N/A . Medication allergies and previous infusion related reactions, if applicable, have been reviewed and addressed. Yes . The patient's current medication list has been assessed for drug-drug interactions  with their chemotherapy regimen. no significant drug-drug interactions were identified on review.  Order Review: . Are the treatment plan orders signed? Yes . Is the patient scheduled to see a provider prior to their treatment? No  I verify that I have reviewed each item in the above checklist and answered each question accordingly.   Kennith Center, Pharm.D., CPP 05/29/2020@4 :29 PM

## 2020-06-01 ENCOUNTER — Other Ambulatory Visit: Payer: Self-pay

## 2020-06-01 ENCOUNTER — Inpatient Hospital Stay: Payer: Medicare Other

## 2020-06-01 ENCOUNTER — Encounter: Payer: Self-pay | Admitting: Hematology and Oncology

## 2020-06-01 ENCOUNTER — Telehealth: Payer: Self-pay

## 2020-06-01 ENCOUNTER — Telehealth: Payer: Self-pay | Admitting: Hematology and Oncology

## 2020-06-01 ENCOUNTER — Inpatient Hospital Stay (HOSPITAL_BASED_OUTPATIENT_CLINIC_OR_DEPARTMENT_OTHER): Payer: Medicare Other | Admitting: Hematology and Oncology

## 2020-06-01 ENCOUNTER — Inpatient Hospital Stay: Payer: Medicare Other | Attending: Hematology and Oncology

## 2020-06-01 DIAGNOSIS — I129 Hypertensive chronic kidney disease with stage 1 through stage 4 chronic kidney disease, or unspecified chronic kidney disease: Secondary | ICD-10-CM | POA: Diagnosis not present

## 2020-06-01 DIAGNOSIS — E1122 Type 2 diabetes mellitus with diabetic chronic kidney disease: Secondary | ICD-10-CM | POA: Insufficient documentation

## 2020-06-01 DIAGNOSIS — C9110 Chronic lymphocytic leukemia of B-cell type not having achieved remission: Secondary | ICD-10-CM | POA: Diagnosis not present

## 2020-06-01 DIAGNOSIS — I420 Dilated cardiomyopathy: Secondary | ICD-10-CM | POA: Insufficient documentation

## 2020-06-01 DIAGNOSIS — E860 Dehydration: Secondary | ICD-10-CM | POA: Diagnosis not present

## 2020-06-01 DIAGNOSIS — Z23 Encounter for immunization: Secondary | ICD-10-CM | POA: Insufficient documentation

## 2020-06-01 DIAGNOSIS — Z7901 Long term (current) use of anticoagulants: Secondary | ICD-10-CM | POA: Insufficient documentation

## 2020-06-01 DIAGNOSIS — N184 Chronic kidney disease, stage 4 (severe): Secondary | ICD-10-CM | POA: Insufficient documentation

## 2020-06-01 DIAGNOSIS — Z7189 Other specified counseling: Secondary | ICD-10-CM

## 2020-06-01 DIAGNOSIS — E119 Type 2 diabetes mellitus without complications: Secondary | ICD-10-CM | POA: Diagnosis not present

## 2020-06-01 DIAGNOSIS — N179 Acute kidney failure, unspecified: Secondary | ICD-10-CM

## 2020-06-01 DIAGNOSIS — Z79899 Other long term (current) drug therapy: Secondary | ICD-10-CM | POA: Diagnosis not present

## 2020-06-01 LAB — URIC ACID: Uric Acid, Serum: 7.9 mg/dL (ref 3.7–8.6)

## 2020-06-01 LAB — CBC WITH DIFFERENTIAL (CANCER CENTER ONLY)
Abs Immature Granulocytes: 0.03 10*3/uL (ref 0.00–0.07)
Basophils Absolute: 0 10*3/uL (ref 0.0–0.1)
Basophils Relative: 1 %
Eosinophils Absolute: 0.1 10*3/uL (ref 0.0–0.5)
Eosinophils Relative: 1 %
HCT: 37.6 % — ABNORMAL LOW (ref 39.0–52.0)
Hemoglobin: 13 g/dL (ref 13.0–17.0)
Immature Granulocytes: 1 %
Lymphocytes Relative: 16 %
Lymphs Abs: 0.9 10*3/uL (ref 0.7–4.0)
MCH: 31.3 pg (ref 26.0–34.0)
MCHC: 34.6 g/dL (ref 30.0–36.0)
MCV: 90.4 fL (ref 80.0–100.0)
Monocytes Absolute: 0.6 10*3/uL (ref 0.1–1.0)
Monocytes Relative: 10 %
Neutro Abs: 4 10*3/uL (ref 1.7–7.7)
Neutrophils Relative %: 71 %
Platelet Count: 160 10*3/uL (ref 150–400)
RBC: 4.16 MIL/uL — ABNORMAL LOW (ref 4.22–5.81)
RDW: 13.6 % (ref 11.5–15.5)
WBC Count: 5.6 10*3/uL (ref 4.0–10.5)
nRBC: 0 % (ref 0.0–0.2)

## 2020-06-01 LAB — CMP (CANCER CENTER ONLY)
ALT: 11 U/L (ref 0–44)
AST: 15 U/L (ref 15–41)
Albumin: 3.7 g/dL (ref 3.5–5.0)
Alkaline Phosphatase: 62 U/L (ref 38–126)
Anion gap: 7 (ref 5–15)
BUN: 26 mg/dL — ABNORMAL HIGH (ref 8–23)
CO2: 23 mmol/L (ref 22–32)
Calcium: 9.5 mg/dL (ref 8.9–10.3)
Chloride: 108 mmol/L (ref 98–111)
Creatinine: 2.2 mg/dL — ABNORMAL HIGH (ref 0.61–1.24)
GFR, Est AFR Am: 32 mL/min — ABNORMAL LOW (ref >60)
GFR, Estimated: 28 mL/min — ABNORMAL LOW (ref >60)
Glucose, Bld: 165 mg/dL — ABNORMAL HIGH (ref 70–99)
Potassium: 4.5 mmol/L (ref 3.5–5.1)
Sodium: 138 mmol/L (ref 135–145)
Total Bilirubin: 0.8 mg/dL (ref 0.3–1.2)
Total Protein: 7 g/dL (ref 6.5–8.1)

## 2020-06-01 LAB — LACTATE DEHYDROGENASE: LDH: 130 U/L (ref 98–192)

## 2020-06-01 LAB — HEPATITIS B CORE ANTIBODY, TOTAL: Hep B Core Total Ab: NONREACTIVE

## 2020-06-01 LAB — HEPATITIS B SURFACE ANTIGEN: Hepatitis B Surface Ag: NONREACTIVE

## 2020-06-01 MED ORDER — APIXABAN 2.5 MG PO TABS: 5.0000 mg | ORAL_TABLET | Freq: Two times a day (BID) | ORAL | Status: DC

## 2020-06-01 NOTE — Telephone Encounter (Signed)
Scheduled appts per 9/13 sch msg. Gave pt a print out of AVS.

## 2020-06-01 NOTE — Assessment & Plan Note (Signed)
He has acute on chronic renal failure likely secondary to dehydration, elevated blood pressure and blood sugar He has appointment to follow-up with nephrologist Recent kidney biopsy did not reveal evidence of CLL We have extensive discussions about delaying bendamustine but proceed with rituximab as scheduled and he is in agreement I recommend increase hydration before his next appointment

## 2020-06-01 NOTE — Assessment & Plan Note (Signed)
He is on chronic anticoagulation therapy I verify he is taking reduced dose apixaban

## 2020-06-01 NOTE — Assessment & Plan Note (Signed)
He has elevated blood sugar likely secondary to diet Based on recommendation, I recommend reducing Amaryl to 1 mg with reduced renal function

## 2020-06-01 NOTE — Assessment & Plan Note (Signed)
Unfortunately, with his acute on chronic renal failure, I do not feel comfortable that we proceed with combination chemotherapy Based on my review, bendamustine is not recommended if creatinine clearance is less than 30 I recommend we proceed with rituximab as scheduled with plan to see him back and repeat labs next week If his renal function improves, we will proceed with reduced dose bendamustine next week He is in agreement with the plan of care

## 2020-06-01 NOTE — Telephone Encounter (Signed)
-----   Message from [name], MD sent at 06/01/2020 11:04 AM EDT ----- Regarding: acute renal failure Can he come back for discussion? See me at 1240 or 145, check in 20 mins before appt Labs are not good Ask him to bring all his prescription meds

## 2020-06-01 NOTE — Progress Notes (Signed)
[facility] OFFICE PROGRESS NOTE  Patient Care Team: Wenda Low, MD as PCP - General (Internal Medicine) Jerline Pain, MD as PCP - Cardiology (Cardiology) Jerline Pain, MD as Attending Physician (Cardiology)  ASSESSMENT & PLAN:  CLL (chronic lymphocytic leukemia) Unfortunately, with his acute on chronic renal failure, I do not feel comfortable that we proceed with combination chemotherapy Based on my review, bendamustine is not recommended if creatinine clearance is less than 30 I recommend we proceed with rituximab as scheduled with plan to see him back and repeat labs next week If his renal function improves, we will proceed with reduced dose bendamustine next week He is in agreement with the plan of care  Acute prerenal failure [facility]) He has acute on chronic renal failure likely secondary to dehydration, elevated blood pressure and blood sugar He has appointment to follow-up with nephrologist Recent kidney biopsy did not reveal evidence of CLL We have extensive discussions about delaying bendamustine but proceed with rituximab as scheduled and he is in agreement I recommend increase hydration before his next appointment  Diabetes mellitus, type II ([place]) He has elevated blood sugar likely secondary to diet Based on recommendation, I recommend reducing Amaryl to 1 mg with reduced renal function  Dilated cardiomyopathy ([place]) He is on chronic anticoagulation therapy I verify he is taking reduced dose apixaban   No orders of the defined types were placed in this encounter.   All questions were answered. The patient knows to call the clinic with any problems, questions or concerns. The total time spent in the appointment was 30 minutes encounter with patients including review of chart and various tests results, discussions about plan of care and coordination of care plan   [name], MD 06/01/2020 1:44 PM  INTERVAL HISTORY: Please see below for problem  oriented charting. He is seen urgently today due to findings of acute on chronic renal failure Since last time I saw him, his neck mass is unchanged He claims he is drinking a lot of fluids His blood sugar is intermittently elevated He denies flank pain or hematuria  SUMMARY OF ONCOLOGIC HISTORY: Oncology History Overview Note  Normal FISH   CLL (chronic lymphocytic leukemia) ([place])  09/10/2013 Initial Diagnosis   CLL (chronic lymphocytic leukemia)   12/26/2013 Pathology Results   FISH analysis was normal.   06/17/2014 Procedure   The patient has placement of Infuse-a-Port.   06/18/2014 Imaging   CT scan of the chest, abdomen and pelvis showed diffuse lymphadenopathy and splenomegaly.   06/19/2014 Bone Marrow Biopsy   Bone marrow aspirate and biopsy show CLL.   06/24/2014 - 11/18/2014 Chemotherapy   He received 6 cycles of Obinutuzumab and chlorambucil   09/15/2014 Imaging   Repeat CT scan of the chest, abdomen and pelvis show greater than 50% reduction in lymphadenopathy and splenomegaly   12/19/2014 Imaging   Repeat CT scan showed complete resolution of lymphadenopathy and splenomegaly.   03/09/2020 Cancer Staging   Staging form: Chronic Lymphocytic Leukemia / Small Lymphocytic Lymphoma, AJCC 8th Edition - Clinical stage from 03/09/2020: Modified Rai Stage I (Modified Rai risk: Intermediate, Lymphocytosis: Present, Adenopathy: Present, Organomegaly: Absent, Anemia: Absent, Thrombocytopenia: Absent) - Signed by [name], MD on 03/09/2020   04/22/2020 Imaging   CT neck 1. Cervical adenopathy involving the bilateral level 1B, right 2/3 and bilateral level 5 nodal stations. 2. Prominent to mildly enlarged upper mediastinal nodes. 3. Prominent subcentimeter right intraparotid node.       REVIEW OF SYSTEMS:  Constitutional: Denies fevers, chills or abnormal weight loss Eyes: Denies blurriness of vision Ears, nose, mouth, throat, and face: Denies mucositis or sore  throat Respiratory: Denies cough, dyspnea or wheezes Cardiovascular: Denies palpitation, chest discomfort or lower extremity swelling Gastrointestinal:  Denies nausea, heartburn or change in bowel habits Skin: Denies abnormal skin rashes Lymphatics: Denies new lymphadenopathy or easy bruising Neurological:Denies numbness, tingling or new weaknesses Behavioral/Psych: Mood is stable, no new changes  All other systems were reviewed with the patient and are negative.  I have reviewed the past medical history, past surgical history, social history and family history with the patient and they are unchanged from previous note.  ALLERGIES:  is allergic to lipitor [atorvastatin].  MEDICATIONS:  Current Outpatient Medications  Medication Sig Dispense Refill   acyclovir (ZOVIRAX) 400 MG tablet Take 1 tablet (400 mg total) by mouth daily. 30 tablet 3   allopurinol (ZYLOPRIM) 300 MG tablet Take 1 tablet (300 mg total) by mouth daily. 30 tablet 3   amiodarone (PACERONE) 200 MG tablet Take 1 tablet (200 mg total) by mouth daily. (Patient not taking: Reported on 05/01/2020) 90 tablet 3   apixaban (ELIQUIS) 2.5 MG TABS tablet Take 2 tablets (5 mg total) by mouth 2 (two) times daily.     glimepiride (AMARYL) 2 MG tablet Take 1 mg by mouth in the morning.     hydrOXYzine (ATARAX/VISTARIL) 25 MG tablet Take 25 mg by mouth at bedtime.     JARDIANCE 10 MG TABS tablet TAKE 1 TABLET BY MOUTH EVERY DAY (Patient not taking: Reported on 05/01/2020) 90 mg 3   levothyroxine (SYNTHROID) 50 MCG tablet Take 50 mcg by mouth daily before breakfast.      metoprolol succinate (TOPROL XL) 25 MG 24 hr tablet Take 0.5 tablets (12.5 mg total) by mouth daily. 45 tablet 3   Omega-3 Fatty Acids (FISH OIL) 1200 MG CAPS Take 2,400 mg by mouth daily.  (Patient not taking: Reported on 05/01/2020)     ondansetron (ZOFRAN) 8 MG tablet Take 1 tablet (8 mg total) by mouth every 8 (eight) hours as needed for refractory nausea /  vomiting. Start on day 2 after bendamustine chemo. 30 tablet 1   prochlorperazine (COMPAZINE) 10 MG tablet Take 1 tablet (10 mg total) by mouth every 6 (six) hours as needed (Nausea or vomiting). 30 tablet 1   rosuvastatin (CRESTOR) 10 MG tablet Take 10 mg by mouth daily.     tamsulosin (FLOMAX) 0.4 MG CAPS capsule Take 0.4 mg by mouth daily.     testosterone cypionate (DEPOTESTOSTERONE CYPIONATE) 200 MG/ML injection Inject 200 mg into the muscle every 14 (fourteen) days.      No current facility-administered medications for this visit.    PHYSICAL EXAMINATION: ECOG PERFORMANCE STATUS: 1 - Symptomatic but completely ambulatory  Vitals:   06/01/20 1236  BP: (!) 152/57  Pulse: (!) 57  Resp: 18  Temp: (!) 97.2 F (36.2 C)  SpO2: 99%   Filed Weights   06/01/20 1236  Weight: (!) 316 lb 9.6 oz (143.6 kg)    GENERAL:alert, no distress and comfortable LYMPH: He has palpable lymphadenopathy in his neck NEURO: alert & oriented x 3 with fluent speech, no focal motor/sensory deficits  LABORATORY DATA:  I have reviewed the data as listed    Component Value Date/Time   NA 138 06/01/2020 0957   NA 138 10/25/2018 1120   NA 140 06/25/2015 0758   K 4.5 06/01/2020 0957   K 3.9 06/25/2015 0758  CL 108 06/01/2020 0957   CL 101 03/11/2013 1031   CO2 23 06/01/2020 0957   CO2 25 06/25/2015 0758   GLUCOSE 165 (H) 06/01/2020 0957   GLUCOSE 140 06/25/2015 0758   GLUCOSE 173 (H) 03/11/2013 1031   BUN 26 (H) 06/01/2020 0957   BUN 16 10/25/2018 1120   BUN 15.9 06/25/2015 0758   CREATININE 2.20 (H) 06/01/2020 0957   CREATININE 1.0 06/25/2015 0758   CALCIUM 9.5 06/01/2020 0957   CALCIUM 9.3 06/25/2015 0758   PROT 7.0 06/01/2020 0957   PROT 6.7 06/25/2015 0758   ALBUMIN 3.7 06/01/2020 0957   ALBUMIN 3.8 06/25/2015 0758   AST 15 06/01/2020 0957   AST 15 06/25/2015 0758   ALT 11 06/01/2020 0957   ALT 14 06/25/2015 0758   ALKPHOS 62 06/01/2020 0957   ALKPHOS 61 06/25/2015 0758    BILITOT 0.8 06/01/2020 0957   BILITOT 0.95 06/25/2015 0758   GFRNONAA 28 (L) 06/01/2020 0957   GFRAA 32 (L) 06/01/2020 0957    No results found for: SPEP, UPEP  Lab Results  Component Value Date   WBC 5.6 06/01/2020   NEUTROABS 4.0 06/01/2020   HGB 13.0 06/01/2020   HCT 37.6 (L) 06/01/2020   MCV 90.4 06/01/2020   PLT 160 06/01/2020      Chemistry      Component Value Date/Time   NA 138 06/01/2020 0957   NA 138 10/25/2018 1120   NA 140 06/25/2015 0758   K 4.5 06/01/2020 0957   K 3.9 06/25/2015 0758   CL 108 06/01/2020 0957   CL 101 03/11/2013 1031   CO2 23 06/01/2020 0957   CO2 25 06/25/2015 0758   BUN 26 (H) 06/01/2020 0957   BUN 16 10/25/2018 1120   BUN 15.9 06/25/2015 0758   CREATININE 2.20 (H) 06/01/2020 0957   CREATININE 1.0 06/25/2015 0758      Component Value Date/Time   CALCIUM 9.5 06/01/2020 0957   CALCIUM 9.3 06/25/2015 0758   ALKPHOS 62 06/01/2020 0957   ALKPHOS 61 06/25/2015 0758   AST 15 06/01/2020 0957   AST 15 06/25/2015 0758   ALT 11 06/01/2020 0957   ALT 14 06/25/2015 0758   BILITOT 0.8 06/01/2020 0957   BILITOT 0.95 06/25/2015 0758       RADIOGRAPHIC STUDIES: I have personally reviewed the radiological images as listed and agreed with the findings in the report. US BIOPSY (KIDNEY)  Result Date: 05/07/2020 INDICATION: Chronic kidney disease, diabetes and worsening renal insufficiency. EXAM: ULTRASOUND GUIDED CORE BIOPSY OF RIGHT KIDNEY MEDICATIONS: None. ANESTHESIA/SEDATION: Fentanyl 75 mcg IV; Versed 1.5 mg IV Moderate Sedation Time:  22 minutes. The patient was continuously monitored during the procedure by the interventional radiology nurse under my direct supervision. PROCEDURE: The procedure, risks, benefits, and alternatives were explained to the patient. Questions regarding the procedure were encouraged and answered. The patient understands and consents to the procedure. A time-out was performed prior to initiating the procedure. Imaging  of the kidneys was performed by ultrasound in a prone position. The right flank region was prepped with chlorhexidine in a sterile fashion, and a sterile drape was applied covering the operative field. A sterile gown and sterile gloves were used for the procedure. Local anesthesia was provided with 1% Lidocaine. Under ultrasound guidance, a 15 gauge trocar needle was advanced to the margin of right lower pole renal cortex. Coaxial 16 gauge core biopsy samples were obtained. Two separate samples were submitted in saline. A slurry of Gel-Foam pledgets  were injected through the outer needle as the needle was retracted and removed. Additional ultrasound was performed. COMPLICATIONS: None immediate. FINDINGS: Both kidneys are visualized by ultrasound. The right was better localized by ultrasound and more superficial from a posterior approach compared to the left. Solid core biopsy samples were obtained. IMPRESSION: Ultrasound-guided core biopsy performed of the right kidney at the level of lower pole cortex. Electronically Signed   By: Aletta Edouard M.D.   On: 05/07/2020 10:35

## 2020-06-01 NOTE — Telephone Encounter (Signed)
Called and given below message. He verbalized understanding. Appt scheudled at 1240 today, he is aware of the time.

## 2020-06-03 ENCOUNTER — Other Ambulatory Visit: Payer: Self-pay

## 2020-06-03 ENCOUNTER — Inpatient Hospital Stay: Payer: Medicare Other

## 2020-06-03 VITALS — BP 148/61 | HR 56 | Temp 98.0°F | Resp 17

## 2020-06-03 DIAGNOSIS — Z7189 Other specified counseling: Secondary | ICD-10-CM

## 2020-06-03 DIAGNOSIS — Z23 Encounter for immunization: Secondary | ICD-10-CM | POA: Diagnosis not present

## 2020-06-03 DIAGNOSIS — N184 Chronic kidney disease, stage 4 (severe): Secondary | ICD-10-CM | POA: Diagnosis not present

## 2020-06-03 DIAGNOSIS — I420 Dilated cardiomyopathy: Secondary | ICD-10-CM | POA: Diagnosis not present

## 2020-06-03 DIAGNOSIS — C9110 Chronic lymphocytic leukemia of B-cell type not having achieved remission: Secondary | ICD-10-CM

## 2020-06-03 DIAGNOSIS — E1122 Type 2 diabetes mellitus with diabetic chronic kidney disease: Secondary | ICD-10-CM | POA: Diagnosis not present

## 2020-06-03 DIAGNOSIS — I129 Hypertensive chronic kidney disease with stage 1 through stage 4 chronic kidney disease, or unspecified chronic kidney disease: Secondary | ICD-10-CM | POA: Diagnosis not present

## 2020-06-03 MED ORDER — DIPHENHYDRAMINE HCL 25 MG PO CAPS
25.0000 mg | ORAL_CAPSULE | Freq: Once | ORAL | Status: AC
  Administered 2020-06-03: 25 mg via ORAL

## 2020-06-03 MED ORDER — ACETAMINOPHEN 325 MG PO TABS
ORAL_TABLET | ORAL | Status: AC
  Filled 2020-06-03: qty 2

## 2020-06-03 MED ORDER — SODIUM CHLORIDE 0.9 % IV SOLN
Freq: Once | INTRAVENOUS | Status: AC
  Filled 2020-06-03: qty 250

## 2020-06-03 MED ORDER — SODIUM CHLORIDE 0.9 % IV SOLN
375.0000 mg/m2 | Freq: Once | INTRAVENOUS | Status: AC
  Administered 2020-06-03: 1000 mg via INTRAVENOUS
  Filled 2020-06-03: qty 100

## 2020-06-03 MED ORDER — ACETAMINOPHEN 325 MG PO TABS
650.0000 mg | ORAL_TABLET | Freq: Once | ORAL | Status: AC
  Administered 2020-06-03: 650 mg via ORAL

## 2020-06-03 MED ORDER — DIPHENHYDRAMINE HCL 25 MG PO CAPS
ORAL_CAPSULE | ORAL | Status: AC
  Filled 2020-06-03: qty 1

## 2020-06-03 MED ORDER — SODIUM CHLORIDE 0.9 % IV SOLN
10.0000 mg | Freq: Once | INTRAVENOUS | Status: AC
  Administered 2020-06-03: 10 mg via INTRAVENOUS
  Filled 2020-06-03: qty 10

## 2020-06-03 NOTE — Progress Notes (Signed)
Per Dr. Alvy Bimler, okay to treat with labs from 06/01/2020.

## 2020-06-03 NOTE — Patient Instructions (Addendum)
[name] Discharge Instructions for Patients Receiving Chemotherapy  Today you received the following chemotherapy agents: rituximab-pvvr  To help prevent nausea and vomiting after your treatment, we encourage you to take your nausea medication as directed.   If you develop nausea and vomiting that is not controlled by your nausea medication, call the clinic.   BELOW ARE SYMPTOMS THAT SHOULD BE REPORTED IMMEDIATELY:  *FEVER GREATER THAN 100.5 F  *CHILLS WITH OR WITHOUT FEVER  NAUSEA AND VOMITING THAT IS NOT CONTROLLED WITH YOUR NAUSEA MEDICATION  *UNUSUAL SHORTNESS OF BREATH  *UNUSUAL BRUISING OR BLEEDING  TENDERNESS IN MOUTH AND THROAT WITH OR WITHOUT PRESENCE OF ULCERS  *URINARY PROBLEMS  *BOWEL PROBLEMS  UNUSUAL RASH Items with * indicate a potential emergency and should be followed up as soon as possible.  Feel free to call the clinic should you have any questions or concerns. The clinic phone number is [phone].  Please show the [facility] at check-in to the Emergency Department and triage nurse.  Rituximab injection What is this medicine? RITUXIMAB (ri TUX i mab) is a monoclonal antibody. It is used to treat certain types of cancer like non-Hodgkin lymphoma and chronic lymphocytic leukemia. It is also used to treat rheumatoid arthritis, granulomatosis with polyangiitis (or Wegener's granulomatosis), microscopic polyangiitis, and pemphigus vulgaris. This medicine may be used for other purposes; ask your health care provider or pharmacist if you have questions. COMMON BRAND NAME(S): Rituxan, RUXIENCE What should I tell my health care provider before I take this medicine? They need to know if you have any of these conditions:  heart disease  infection (especially a virus infection such as hepatitis B, chickenpox, cold sores, or herpes)  immune system problems  irregular heartbeat  kidney disease  low blood counts, like low white  cell, platelet, or red cell counts  lung or breathing disease, like asthma  recently received or scheduled to receive a vaccine  an unusual or allergic reaction to rituximab, other medicines, foods, dyes, or preservatives  pregnant or trying to get pregnant  breast-feeding How should I use this medicine? This medicine is for infusion into a vein. It is administered in a hospital or clinic by a specially trained health care professional. A special MedGuide will be given to you by the pharmacist with each prescription and refill. Be sure to read this information carefully each time. Talk to your pediatrician regarding the use of this medicine in children. This medicine is not approved for use in children. Overdosage: If you think you have taken too much of this medicine contact a poison control center or emergency room at once. NOTE: This medicine is only for you. Do not share this medicine with others. What if I miss a dose? It is important not to miss a dose. Call your doctor or health care professional if you are unable to keep an appointment. What may interact with this medicine?  cisplatin  live virus vaccines This list may not describe all possible interactions. Give your health care provider a list of all the medicines, herbs, non-prescription drugs, or dietary supplements you use. Also tell them if you smoke, drink alcohol, or use illegal drugs. Some items may interact with your medicine. What should I watch for while using this medicine? Your condition will be monitored carefully while you are receiving this medicine. You may need blood work done while you are taking this medicine. This medicine can cause serious allergic reactions. To reduce your risk you may  need to take medicine before treatment with this medicine. Take your medicine as directed. In some patients, this medicine may cause a serious brain infection that may cause death. If you have any problems seeing, thinking,  speaking, walking, or standing, tell your healthcare professional right away. If you cannot reach your healthcare professional, urgently seek other source of medical care. Call your doctor or health care professional for advice if you get a fever, chills or sore throat, or other symptoms of a cold or flu. Do not treat yourself. This drug decreases your body's ability to fight infections. Try to avoid being around people who are sick. Do not become pregnant while taking this medicine or for at least 12 months after stopping it. Women should inform their doctor if they wish to become pregnant or think they might be pregnant. There is a potential for serious side effects to an unborn child. Talk to your health care professional or pharmacist for more information. Do not breast-feed an infant while taking this medicine or for at least 6 months after stopping it. What side effects may I notice from receiving this medicine? Side effects that you should report to your doctor or health care professional as soon as possible:  allergic reactions like skin rash, itching or hives; swelling of the face, lips, or tongue  breathing problems  chest pain  changes in vision  diarrhea  headache with fever, neck stiffness, sensitivity to light, nausea, or confusion  fast, irregular heartbeat  loss of memory  low blood counts - this medicine may decrease the number of white blood cells, red blood cells and platelets. You may be at increased risk for infections and bleeding.  mouth sores  problems with balance, talking, or walking  redness, blistering, peeling or loosening of the skin, including inside the mouth  signs of infection - fever or chills, cough, sore throat, pain or difficulty passing urine  signs and symptoms of kidney injury like trouble passing urine or change in the amount of urine  signs and symptoms of liver injury like dark yellow or brown urine; general ill feeling or flu-like  symptoms; light-colored stools; loss of appetite; nausea; right upper belly pain; unusually weak or tired; yellowing of the eyes or skin  signs and symptoms of low blood pressure like dizziness; feeling faint or lightheaded, falls; unusually weak or tired  stomach pain  swelling of the ankles, feet, hands  unusual bleeding or bruising  vomiting Side effects that usually do not require medical attention (report to your doctor or health care professional if they continue or are bothersome):  headache  joint pain  muscle cramps or muscle pain  nausea  tiredness This list may not describe all possible side effects. Call your doctor for medical advice about side effects. You may report side effects to FDA at 1-800-FDA-1088. Where should I keep my medicine? This drug is given in a hospital or clinic and will not be stored at home. NOTE: This sheet is a summary. It may not cover all possible information. If you have questions about this medicine, talk to your doctor, pharmacist, or health care provider.  2020 Elsevier/Gold Standard (2018-10-17 22:01:36)

## 2020-06-04 ENCOUNTER — Inpatient Hospital Stay: Payer: Medicare Other

## 2020-06-04 ENCOUNTER — Telehealth: Payer: Self-pay

## 2020-06-04 NOTE — Telephone Encounter (Signed)
Called regarding new treatment yesterday to follow up. He is doing well with no complaints. Instructed to call the office for questions or concerns. He verbalized understanding.

## 2020-06-04 NOTE — Telephone Encounter (Signed)
-----   Message from [name], RN sent at 06/03/2020  5:30 PM EDT ----- Regarding: [name] Patient received 1st time ruxience.  Tolerated well.  No s/s or c/o distress or discomfort.

## 2020-06-05 DIAGNOSIS — C44222 Squamous cell carcinoma of skin of right ear and external auricular canal: Secondary | ICD-10-CM | POA: Diagnosis not present

## 2020-06-05 DIAGNOSIS — L821 Other seborrheic keratosis: Secondary | ICD-10-CM | POA: Diagnosis not present

## 2020-06-05 DIAGNOSIS — L570 Actinic keratosis: Secondary | ICD-10-CM | POA: Diagnosis not present

## 2020-06-05 DIAGNOSIS — D225 Melanocytic nevi of trunk: Secondary | ICD-10-CM | POA: Diagnosis not present

## 2020-06-05 DIAGNOSIS — D1801 Hemangioma of skin and subcutaneous tissue: Secondary | ICD-10-CM | POA: Diagnosis not present

## 2020-06-05 DIAGNOSIS — L814 Other melanin hyperpigmentation: Secondary | ICD-10-CM | POA: Diagnosis not present

## 2020-06-09 ENCOUNTER — Inpatient Hospital Stay (HOSPITAL_BASED_OUTPATIENT_CLINIC_OR_DEPARTMENT_OTHER): Payer: Medicare Other | Admitting: Hematology and Oncology

## 2020-06-09 ENCOUNTER — Other Ambulatory Visit: Payer: Self-pay

## 2020-06-09 ENCOUNTER — Encounter: Payer: Self-pay | Admitting: Hematology and Oncology

## 2020-06-09 ENCOUNTER — Inpatient Hospital Stay: Payer: Medicare Other

## 2020-06-09 DIAGNOSIS — Z23 Encounter for immunization: Secondary | ICD-10-CM | POA: Diagnosis not present

## 2020-06-09 DIAGNOSIS — I129 Hypertensive chronic kidney disease with stage 1 through stage 4 chronic kidney disease, or unspecified chronic kidney disease: Secondary | ICD-10-CM | POA: Diagnosis not present

## 2020-06-09 DIAGNOSIS — E119 Type 2 diabetes mellitus without complications: Secondary | ICD-10-CM

## 2020-06-09 DIAGNOSIS — C9110 Chronic lymphocytic leukemia of B-cell type not having achieved remission: Secondary | ICD-10-CM

## 2020-06-09 DIAGNOSIS — N184 Chronic kidney disease, stage 4 (severe): Secondary | ICD-10-CM | POA: Diagnosis not present

## 2020-06-09 DIAGNOSIS — Z87442 Personal history of urinary calculi: Secondary | ICD-10-CM | POA: Diagnosis not present

## 2020-06-09 DIAGNOSIS — I420 Dilated cardiomyopathy: Secondary | ICD-10-CM | POA: Diagnosis not present

## 2020-06-09 DIAGNOSIS — N1832 Chronic kidney disease, stage 3b: Secondary | ICD-10-CM | POA: Diagnosis not present

## 2020-06-09 DIAGNOSIS — Z7189 Other specified counseling: Secondary | ICD-10-CM

## 2020-06-09 DIAGNOSIS — E1122 Type 2 diabetes mellitus with diabetic chronic kidney disease: Secondary | ICD-10-CM | POA: Diagnosis not present

## 2020-06-09 LAB — CBC WITH DIFFERENTIAL/PLATELET
Abs Immature Granulocytes: 0.04 10*3/uL (ref 0.00–0.07)
Basophils Absolute: 0 10*3/uL (ref 0.0–0.1)
Basophils Relative: 0 %
Eosinophils Absolute: 0.1 10*3/uL (ref 0.0–0.5)
Eosinophils Relative: 2 %
HCT: 39.3 % (ref 39.0–52.0)
Hemoglobin: 13.3 g/dL (ref 13.0–17.0)
Immature Granulocytes: 1 %
Lymphocytes Relative: 15 %
Lymphs Abs: 1.1 10*3/uL (ref 0.7–4.0)
MCH: 30.3 pg (ref 26.0–34.0)
MCHC: 33.8 g/dL (ref 30.0–36.0)
MCV: 89.5 fL (ref 80.0–100.0)
Monocytes Absolute: 0.7 10*3/uL (ref 0.1–1.0)
Monocytes Relative: 10 %
Neutro Abs: 5.4 10*3/uL (ref 1.7–7.7)
Neutrophils Relative %: 72 %
Platelets: 174 10*3/uL (ref 150–400)
RBC: 4.39 MIL/uL (ref 4.22–5.81)
RDW: 13.2 % (ref 11.5–15.5)
WBC: 7.3 10*3/uL (ref 4.0–10.5)
nRBC: 0 % (ref 0.0–0.2)

## 2020-06-09 LAB — COMPREHENSIVE METABOLIC PANEL
ALT: 15 U/L (ref 0–44)
AST: 13 U/L — ABNORMAL LOW (ref 15–41)
Albumin: 3.6 g/dL (ref 3.5–5.0)
Alkaline Phosphatase: 59 U/L (ref 38–126)
Anion gap: 7 (ref 5–15)
BUN: 33 mg/dL — ABNORMAL HIGH (ref 8–23)
CO2: 23 mmol/L (ref 22–32)
Calcium: 9.3 mg/dL (ref 8.9–10.3)
Chloride: 106 mmol/L (ref 98–111)
Creatinine, Ser: 2.37 mg/dL — ABNORMAL HIGH (ref 0.61–1.24)
GFR calc Af Amer: 29 mL/min — ABNORMAL LOW (ref >60)
GFR calc non Af Amer: 25 mL/min — ABNORMAL LOW (ref >60)
Glucose, Bld: 176 mg/dL — ABNORMAL HIGH (ref 70–99)
Potassium: 5 mmol/L (ref 3.5–5.1)
Sodium: 136 mmol/L (ref 135–145)
Total Bilirubin: 0.8 mg/dL (ref 0.3–1.2)
Total Protein: 7 g/dL (ref 6.5–8.1)

## 2020-06-09 LAB — URIC ACID: Uric Acid, Serum: 7.7 mg/dL (ref 3.7–8.6)

## 2020-06-09 NOTE — Assessment & Plan Note (Signed)
He has appointment to see nephrologist today I recommend he keeps his appointment For now, he will continue medical management

## 2020-06-09 NOTE — Assessment & Plan Note (Signed)
His renal function remains unchanged, even slightly worse compared to his last visit I will cancel bendamustine that was scheduled for tomorrow and Thursday Given he had positive response to treatment, I plan to keep his appointment as scheduled next month If his renal function remained the same and not improved, I plan to just do rituximab only

## 2020-06-09 NOTE — Assessment & Plan Note (Signed)
He has elevated blood sugar We discussed the importance of risk factor modification to avoid worsening renal function

## 2020-06-09 NOTE — Progress Notes (Signed)
[place] OFFICE PROGRESS NOTE  Patient Care Team: Wenda Low, MD as PCP - General (Internal Medicine) Jerline Pain, MD as PCP - Cardiology (Cardiology) Jerline Pain, MD as Attending Physician (Cardiology)  ASSESSMENT & PLAN:  CLL (chronic lymphocytic leukemia) His renal function remains unchanged, even slightly worse compared to his last visit I will cancel bendamustine that was scheduled for tomorrow and Thursday Given he had positive response to treatment, I plan to keep his appointment as scheduled next month If his renal function remained the same and not improved, I plan to just do rituximab only  Chronic kidney disease (CKD), 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[place]) He has appointment to see nephrologist today I recommend he keeps his appointment For now, he will continue medical management  Diabetes mellitus, type II ([place]) He has elevated blood sugar We discussed the importance of risk factor modification to avoid worsening renal function   No orders of the defined types were placed in this encounter.   All questions were answered. The patient knows to call the clinic with any problems, questions or concerns. The total time spent in the appointment was 20 minutes encounter with patients including review of chart and various tests results, discussions about plan of care and coordination of care plan   [name], MD 06/09/2020 10:19 AM  INTERVAL HISTORY: Please see below for problem oriented charting. He returns for further follow-up He tolerated cycle 1 of rituximab without complications The lymphadenopathy has resolved He has no side effects from treatment so far  SUMMARY OF ONCOLOGIC HISTORY: Oncology History Overview Note  Normal FISH   CLL (chronic lymphocytic leukemia) ([place])  09/10/2013 Initial Diagnosis   CLL (chronic lymphocytic leukemia)   12/26/2013 Pathology Results   FISH analysis was normal.   06/17/2014 Procedure   The patient has placement  of Infuse-a-Port.   06/18/2014 Imaging   CT scan of the chest, abdomen and pelvis showed diffuse lymphadenopathy and splenomegaly.   06/19/2014 Bone Marrow Biopsy   Bone marrow aspirate and biopsy show CLL.   06/24/2014 - 11/18/2014 Chemotherapy   He received 6 cycles of Obinutuzumab and chlorambucil   09/15/2014 Imaging   Repeat CT scan of the chest, abdomen and pelvis show greater than 50% reduction in lymphadenopathy and splenomegaly   12/19/2014 Imaging   Repeat CT scan showed complete resolution of lymphadenopathy and splenomegaly.   03/09/2020 Cancer Staging   Staging form: Chronic Lymphocytic Leukemia / Small Lymphocytic Lymphoma, AJCC 8th Edition - Clinical stage from 03/09/2020: Modified Rai Stage I (Modified Rai risk: Intermediate, Lymphocytosis: Present, Adenopathy: Present, Organomegaly: Absent, Anemia: Absent, Thrombocytopenia: Absent) - Signed by [name], MD on 03/09/2020   04/22/2020 Imaging   CT neck 1. Cervical adenopathy involving the bilateral level 1B, right 2/3 and bilateral level 5 nodal stations. 2. Prominent to mildly enlarged upper mediastinal nodes. 3. Prominent subcentimeter right intraparotid node.     06/03/2020 -  Chemotherapy   The patient had rituximab for chemotherapy treatment.       REVIEW OF SYSTEMS:   Constitutional: Denies fevers, chills or abnormal weight loss Eyes: Denies blurriness of vision Ears, nose, mouth, throat, and face: Denies mucositis or sore throat Respiratory: Denies cough, dyspnea or wheezes Cardiovascular: Denies palpitation, chest discomfort or lower extremity swelling Gastrointestinal:  Denies nausea, heartburn or change in bowel habits Skin: Denies abnormal skin rashes Lymphatics: Denies new lymphadenopathy or easy bruising Neurological:Denies numbness, tingling or new weaknesses Behavioral/Psych: Mood is stable, no new changes  All other  systems were reviewed with the patient and are negative.  I have reviewed the past  medical history, past surgical history, social history and family history with the patient and they are unchanged from previous note.  ALLERGIES:  is allergic to lipitor [atorvastatin].  MEDICATIONS:  Current Outpatient Medications  Medication Sig Dispense Refill  . acyclovir (ZOVIRAX) 400 MG tablet Take 1 tablet (400 mg total) by mouth daily. 30 tablet 3  . allopurinol (ZYLOPRIM) 300 MG tablet Take 1 tablet (300 mg total) by mouth daily. 30 tablet 3  . amiodarone (PACERONE) 200 MG tablet Take 1 tablet (200 mg total) by mouth daily. (Patient not taking: Reported on 05/01/2020) 90 tablet 3  . apixaban (ELIQUIS) 2.5 MG TABS tablet Take 2 tablets (5 mg total) by mouth 2 (two) times daily.    Marland Kitchen glimepiride (AMARYL) 2 MG tablet Take 1 mg by mouth in the morning.    . hydrOXYzine (ATARAX/VISTARIL) 25 MG tablet Take 25 mg by mouth at bedtime.    Marland Kitchen JARDIANCE 10 MG TABS tablet TAKE 1 TABLET BY MOUTH EVERY DAY (Patient not taking: Reported on 05/01/2020) 90 mg 3  . levothyroxine (SYNTHROID) 50 MCG tablet Take 50 mcg by mouth daily before breakfast.     . metoprolol succinate (TOPROL XL) 25 MG 24 hr tablet Take 0.5 tablets (12.5 mg total) by mouth daily. 45 tablet 3  . Omega-3 Fatty Acids (FISH OIL) 1200 MG CAPS Take 2,400 mg by mouth daily.  (Patient not taking: Reported on 05/01/2020)    . ondansetron (ZOFRAN) 8 MG tablet Take 1 tablet (8 mg total) by mouth every 8 (eight) hours as needed for refractory nausea / vomiting. Start on day 2 after bendamustine chemo. 30 tablet 1  . prochlorperazine (COMPAZINE) 10 MG tablet Take 1 tablet (10 mg total) by mouth every 6 (six) hours as needed (Nausea or vomiting). 30 tablet 1  . rosuvastatin (CRESTOR) 10 MG tablet Take 10 mg by mouth daily.    . tamsulosin (FLOMAX) 0.4 MG CAPS capsule Take 0.4 mg by mouth daily.    Marland Kitchen testosterone cypionate (DEPOTESTOSTERONE CYPIONATE) 200 MG/ML injection Inject 200 mg into the muscle every 14 (fourteen) days.      No current  facility-administered medications for this visit.    PHYSICAL EXAMINATION: ECOG PERFORMANCE STATUS: 1 - Symptomatic but completely ambulatory  Vitals:   06/09/20 0904  BP: (!) 148/59  Pulse: (!) 54  Resp: 18  Temp: 97.7 F (36.5 C)  SpO2: 98%   Filed Weights   06/09/20 0904  Weight: (!) 317 lb (143.8 kg)    GENERAL:alert, no distress and comfortable SKIN: skin color, texture, turgor are normal, no rashes or significant lesions EYES: normal, Conjunctiva are pink and non-injected, sclera clear OROPHARYNX:no exudate, no erythema and lips, buccal mucosa, and tongue normal  NECK: supple, thyroid normal size, non-tender, without nodularity LYMPH: The previously palpable lymphadenopathy has resolved  LUNGS: clear to auscultation and percussion with normal breathing effort HEART: regular rate & rhythm and no murmurs and no lower extremity edema ABDOMEN:abdomen soft, non-tender and normal bowel sounds Musculoskeletal:no cyanosis of digits and no clubbing  NEURO: alert & oriented x 3 with fluent speech, no focal motor/sensory deficits  LABORATORY DATA:  I have reviewed the data as listed    Component Value Date/Time   NA 136 06/09/2020 0847   NA 138 10/25/2018 1120   NA 140 06/25/2015 0758   K 5.0 06/09/2020 0847   K 3.9 06/25/2015 0758  CL 106 06/09/2020 0847   CL 101 03/11/2013 1031   CO2 23 06/09/2020 0847   CO2 25 06/25/2015 0758   GLUCOSE 176 (H) 06/09/2020 0847   GLUCOSE 140 06/25/2015 0758   GLUCOSE 173 (H) 03/11/2013 1031   BUN 33 (H) 06/09/2020 0847   BUN 16 10/25/2018 1120   BUN 15.9 06/25/2015 0758   CREATININE 2.37 (H) 06/09/2020 0847   CREATININE 2.20 (H) 06/01/2020 0957   CREATININE 1.0 06/25/2015 0758   CALCIUM 9.3 06/09/2020 0847   CALCIUM 9.3 06/25/2015 0758   PROT 7.0 06/09/2020 0847   PROT 6.7 06/25/2015 0758   ALBUMIN 3.6 06/09/2020 0847   ALBUMIN 3.8 06/25/2015 0758   AST 13 (L) 06/09/2020 0847   AST 15 06/01/2020 0957   AST 15 06/25/2015 0758    ALT 15 06/09/2020 0847   ALT 11 06/01/2020 0957   ALT 14 06/25/2015 0758   ALKPHOS 59 06/09/2020 0847   ALKPHOS 61 06/25/2015 0758   BILITOT 0.8 06/09/2020 0847   BILITOT 0.8 06/01/2020 0957   BILITOT 0.95 06/25/2015 0758   GFRNONAA 25 (L) 06/09/2020 0847   GFRNONAA 28 (L) 06/01/2020 0957   GFRAA 29 (L) 06/09/2020 0847   GFRAA 32 (L) 06/01/2020 0957    No results found for: SPEP, UPEP  Lab Results  Component Value Date   WBC 7.3 06/09/2020   NEUTROABS 5.4 06/09/2020   HGB 13.3 06/09/2020   HCT 39.3 06/09/2020   MCV 89.5 06/09/2020   PLT 174 06/09/2020      Chemistry      Component Value Date/Time   NA 136 06/09/2020 0847   NA 138 10/25/2018 1120   NA 140 06/25/2015 0758   K 5.0 06/09/2020 0847   K 3.9 06/25/2015 0758   CL 106 06/09/2020 0847   CL 101 03/11/2013 1031   CO2 23 06/09/2020 0847   CO2 25 06/25/2015 0758   BUN 33 (H) 06/09/2020 0847   BUN 16 10/25/2018 1120   BUN 15.9 06/25/2015 0758   CREATININE 2.37 (H) 06/09/2020 0847   CREATININE 2.20 (H) 06/01/2020 0957   CREATININE 1.0 06/25/2015 0758      Component Value Date/Time   CALCIUM 9.3 06/09/2020 0847   CALCIUM 9.3 06/25/2015 0758   ALKPHOS 59 06/09/2020 0847   ALKPHOS 61 06/25/2015 0758   AST 13 (L) 06/09/2020 0847   AST 15 06/01/2020 0957   AST 15 06/25/2015 0758   ALT 15 06/09/2020 0847   ALT 11 06/01/2020 0957   ALT 14 06/25/2015 0758   BILITOT 0.8 06/09/2020 0847   BILITOT 0.8 06/01/2020 0957   BILITOT 0.95 06/25/2015 0758

## 2020-06-10 ENCOUNTER — Inpatient Hospital Stay: Payer: Medicare Other

## 2020-06-11 ENCOUNTER — Inpatient Hospital Stay: Payer: Medicare Other

## 2020-06-23 ENCOUNTER — Other Ambulatory Visit: Payer: Self-pay | Admitting: Internal Medicine

## 2020-06-23 DIAGNOSIS — N1832 Chronic kidney disease, stage 3b: Secondary | ICD-10-CM

## 2020-06-23 DIAGNOSIS — I129 Hypertensive chronic kidney disease with stage 1 through stage 4 chronic kidney disease, or unspecified chronic kidney disease: Secondary | ICD-10-CM

## 2020-06-30 ENCOUNTER — Ambulatory Visit: Payer: Medicare Other | Admitting: Hematology and Oncology

## 2020-06-30 ENCOUNTER — Other Ambulatory Visit: Payer: Medicare Other

## 2020-06-30 ENCOUNTER — Ambulatory Visit: Payer: Medicare Other

## 2020-07-01 ENCOUNTER — Ambulatory Visit: Payer: Medicare Other

## 2020-07-03 NOTE — Progress Notes (Addendum)
Rapid Infusion Rituximab Pharmacist Evaluation  NYKO GELL is a 79 y.o. male being treated with rituximab for CLL. This patient may be considered for RIR.   A pharmacist has verified the patient tolerated rituximab infusions per the [facility] standard infusion protocol without grade 3-4 infusion reactions. The treatment plan will be updated to reflect RIR if the patient qualifies per the checklist below:   Age > 46 years old Yes   Clinically significant cardiovascular disease No   Circulating lymphocyte count < 5000/uL prior to cycle two Yes Lab Results  Component Value Date   LYMPHSABS 1.1 06/09/2020    Prior documented grade 3-4 infusion reaction to rituximab No   Prior documented grade 1-2 infusion reaction to rituximab (If YES, Pharmacist will confirm with Physician if patient is still a candidate for RIR) No   Previous rituximab infusion within the past 6 months Yes   Treatment Plan updated orders to reflect RIR Yes    ATIBA KIMBERLIN does meet the criteria for Rapid Infusion Rituximab. This patient is going to be switched to rapid infusion rituximab.  Scr 2.14 today (~ 2) and stable.  Acquanetta Belling 07/03/20 5:18 PM

## 2020-07-07 ENCOUNTER — Other Ambulatory Visit: Payer: Self-pay | Admitting: Hematology and Oncology

## 2020-07-07 ENCOUNTER — Inpatient Hospital Stay: Payer: Medicare Other | Attending: Hematology and Oncology

## 2020-07-07 ENCOUNTER — Inpatient Hospital Stay: Payer: Medicare Other

## 2020-07-07 ENCOUNTER — Inpatient Hospital Stay (HOSPITAL_BASED_OUTPATIENT_CLINIC_OR_DEPARTMENT_OTHER): Payer: Medicare Other | Admitting: Hematology and Oncology

## 2020-07-07 ENCOUNTER — Other Ambulatory Visit: Payer: Self-pay

## 2020-07-07 ENCOUNTER — Encounter: Payer: Self-pay | Admitting: Hematology and Oncology

## 2020-07-07 VITALS — BP 148/60 | HR 62 | Temp 97.8°F | Resp 18

## 2020-07-07 DIAGNOSIS — Z299 Encounter for prophylactic measures, unspecified: Secondary | ICD-10-CM

## 2020-07-07 DIAGNOSIS — D696 Thrombocytopenia, unspecified: Secondary | ICD-10-CM

## 2020-07-07 DIAGNOSIS — N184 Chronic kidney disease, stage 4 (severe): Secondary | ICD-10-CM

## 2020-07-07 DIAGNOSIS — Z5112 Encounter for antineoplastic immunotherapy: Secondary | ICD-10-CM | POA: Insufficient documentation

## 2020-07-07 DIAGNOSIS — Z79899 Other long term (current) drug therapy: Secondary | ICD-10-CM | POA: Diagnosis not present

## 2020-07-07 DIAGNOSIS — Z7189 Other specified counseling: Secondary | ICD-10-CM

## 2020-07-07 DIAGNOSIS — C4441 Basal cell carcinoma of skin of scalp and neck: Secondary | ICD-10-CM | POA: Diagnosis not present

## 2020-07-07 DIAGNOSIS — C9110 Chronic lymphocytic leukemia of B-cell type not having achieved remission: Secondary | ICD-10-CM

## 2020-07-07 DIAGNOSIS — Z23 Encounter for immunization: Secondary | ICD-10-CM | POA: Diagnosis not present

## 2020-07-07 DIAGNOSIS — R634 Abnormal weight loss: Secondary | ICD-10-CM | POA: Insufficient documentation

## 2020-07-07 DIAGNOSIS — Z7901 Long term (current) use of anticoagulants: Secondary | ICD-10-CM | POA: Diagnosis not present

## 2020-07-07 LAB — CBC WITH DIFFERENTIAL/PLATELET
Abs Immature Granulocytes: 0.02 10*3/uL (ref 0.00–0.07)
Basophils Absolute: 0 10*3/uL (ref 0.0–0.1)
Basophils Relative: 0 %
Eosinophils Absolute: 0.1 10*3/uL (ref 0.0–0.5)
Eosinophils Relative: 2 %
HCT: 40.3 % (ref 39.0–52.0)
Hemoglobin: 13.9 g/dL (ref 13.0–17.0)
Immature Granulocytes: 0 %
Lymphocytes Relative: 15 %
Lymphs Abs: 1.1 10*3/uL (ref 0.7–4.0)
MCH: 31.1 pg (ref 26.0–34.0)
MCHC: 34.5 g/dL (ref 30.0–36.0)
MCV: 90.2 fL (ref 80.0–100.0)
Monocytes Absolute: 0.7 10*3/uL (ref 0.1–1.0)
Monocytes Relative: 10 %
Neutro Abs: 5.1 10*3/uL (ref 1.7–7.7)
Neutrophils Relative %: 73 %
Platelets: 146 10*3/uL — ABNORMAL LOW (ref 150–400)
RBC: 4.47 MIL/uL (ref 4.22–5.81)
RDW: 14.3 % (ref 11.5–15.5)
WBC: 7 10*3/uL (ref 4.0–10.5)
nRBC: 0 % (ref 0.0–0.2)

## 2020-07-07 LAB — COMPREHENSIVE METABOLIC PANEL
ALT: 16 U/L (ref 0–44)
AST: 19 U/L (ref 15–41)
Albumin: 4 g/dL (ref 3.5–5.0)
Alkaline Phosphatase: 63 U/L (ref 38–126)
Anion gap: 7 (ref 5–15)
BUN: 27 mg/dL — ABNORMAL HIGH (ref 8–23)
CO2: 23 mmol/L (ref 22–32)
Calcium: 9.7 mg/dL (ref 8.9–10.3)
Chloride: 106 mmol/L (ref 98–111)
Creatinine, Ser: 2.14 mg/dL — ABNORMAL HIGH (ref 0.61–1.24)
GFR, Estimated: 29 mL/min — ABNORMAL LOW (ref >60)
Glucose, Bld: 141 mg/dL — ABNORMAL HIGH (ref 70–99)
Potassium: 4.5 mmol/L (ref 3.5–5.1)
Sodium: 136 mmol/L (ref 135–145)
Total Bilirubin: 0.9 mg/dL (ref 0.3–1.2)
Total Protein: 7.1 g/dL (ref 6.5–8.1)

## 2020-07-07 LAB — URIC ACID: Uric Acid, Serum: 7.8 mg/dL (ref 3.7–8.6)

## 2020-07-07 MED ORDER — DIPHENHYDRAMINE HCL 25 MG PO CAPS
25.0000 mg | ORAL_CAPSULE | Freq: Once | ORAL | Status: AC
  Administered 2020-07-07: 25 mg via ORAL

## 2020-07-07 MED ORDER — PALONOSETRON HCL INJECTION 0.25 MG/5ML: 0.2500 mg | Freq: Once | INTRAVENOUS | Status: DC

## 2020-07-07 MED ORDER — SODIUM CHLORIDE 0.9 % IV SOLN
10.0000 mg | Freq: Once | INTRAVENOUS | Status: DC
  Filled 2020-07-07: qty 1

## 2020-07-07 MED ORDER — ACETAMINOPHEN 325 MG PO TABS
ORAL_TABLET | ORAL | Status: AC
  Filled 2020-07-07: qty 2

## 2020-07-07 MED ORDER — SODIUM CHLORIDE 0.9 % IV SOLN
Freq: Once | INTRAVENOUS | Status: AC
  Filled 2020-07-07: qty 250

## 2020-07-07 MED ORDER — ACETAMINOPHEN 325 MG PO TABS
650.0000 mg | ORAL_TABLET | Freq: Once | ORAL | Status: AC
  Administered 2020-07-07: 650 mg via ORAL

## 2020-07-07 MED ORDER — DIPHENHYDRAMINE HCL 25 MG PO CAPS
ORAL_CAPSULE | ORAL | Status: AC
  Filled 2020-07-07: qty 1

## 2020-07-07 MED ORDER — PALONOSETRON HCL INJECTION 0.25 MG/5ML
INTRAVENOUS | Status: AC
  Filled 2020-07-07: qty 5

## 2020-07-07 MED ORDER — SODIUM CHLORIDE 0.9 % IV SOLN
375.0000 mg/m2 | Freq: Once | INTRAVENOUS | Status: AC
  Administered 2020-07-07: 1000 mg via INTRAVENOUS
  Filled 2020-07-07: qty 100

## 2020-07-07 NOTE — Progress Notes (Signed)
[place] OFFICE PROGRESS NOTE  Patient Care Team: Wenda Low, MD as PCP - General (Internal Medicine) Jerline Pain, MD as PCP - Cardiology (Cardiology) Jerline Pain, MD as Attending Physician (Cardiology)  ASSESSMENT & PLAN:  CLL (chronic lymphocytic leukemia) Overall, he tolerated single agent rituximab well The lymphadenopathy in the head and neck region has almost completely resolved Given his persistent elevated creatinine function, overall, I feel it is best for him to just continue on single agent immunotherapy only without bendamustine I recommend we proceed with 3 cycles of treatment and then repeat imaging study and determine his response based on the imaging study If he has excellent response to therapy, I see no reason to prescribe additional bendamustine However, if he has evidence of less than robust response to treatment, we can consider adding bendamustine in the future He is in agreement with the plan of care  Basal cell carcinoma (BCC) of scalp He has history of basal cell carcinoma of the scalp He has a new lesion on the right side of his cheek consistent with probable basal cell carcinoma He has appointment to see dermatologist tomorrow I would defer to dermatologist for biopsy and management  Thrombocytopenia He has history of intermittent thrombocytopenia It is very mild He is not symptomatic Observe only There is no contraindication to remain on antiplatelet agents or anticoagulants as long as the platelet is greater than 50,000.    Chronic kidney disease (CKD), 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[facility]) His creatinine is stable/mildly improved but that was to put him at chronic kidney disease stage IV As above, I plan to not proceed with chemotherapy for cycle 2 and 3 Continue risk factor management per nephrologist  Weight loss, non-intentional He has recent weight loss I will adjust the dose of his treatment I suspect it could be due to loss of  fluid weight as overall, he appears healthy.  He has no leg edema on exam today  Preventive measure I recommend him to consider influenza vaccine today   No orders of the defined types were placed in this encounter.   All questions were answered. The patient knows to call the clinic with any problems, questions or concerns. The total time spent in the appointment was 30 minutes encounter with patients including review of chart and various tests results, discussions about plan of care and coordination of care plan   [name], MD 07/07/2020 10:11 AM  INTERVAL HISTORY: Please see below for problem oriented charting. He returns for cycle 2 of treatment He is doing well He has no side effects from treatment so far The lymphadenopathy in the head and neck region has almost completely resolved He appears to have lost some weight but he eats normal Denies recent shortness of breath  SUMMARY OF ONCOLOGIC HISTORY: Oncology History Overview Note  Normal FISH   CLL (chronic lymphocytic leukemia) ([place])  09/10/2013 Initial Diagnosis   CLL (chronic lymphocytic leukemia)   12/26/2013 Pathology Results   FISH analysis was normal.   06/17/2014 Procedure   The patient has placement of Infuse-a-Port.   06/18/2014 Imaging   CT scan of the chest, abdomen and pelvis showed diffuse lymphadenopathy and splenomegaly.   06/19/2014 Bone Marrow Biopsy   Bone marrow aspirate and biopsy show CLL.   06/24/2014 - 11/18/2014 Chemotherapy   He received 6 cycles of Obinutuzumab and chlorambucil   09/15/2014 Imaging   Repeat CT scan of the chest, abdomen and pelvis show greater than 50% reduction in lymphadenopathy  and splenomegaly   12/19/2014 Imaging   Repeat CT scan showed complete resolution of lymphadenopathy and splenomegaly.   03/09/2020 Cancer Staging   Staging form: Chronic Lymphocytic Leukemia / Small Lymphocytic Lymphoma, AJCC 8th Edition - Clinical stage from 03/09/2020: Modified Rai Stage I  (Modified Rai risk: Intermediate, Lymphocytosis: Present, Adenopathy: Present, Organomegaly: Absent, Anemia: Absent, Thrombocytopenia: Absent) - Signed by [name], MD on 03/09/2020   04/22/2020 Imaging   CT neck 1. Cervical adenopathy involving the bilateral level 1B, right 2/3 and bilateral level 5 nodal stations. 2. Prominent to mildly enlarged upper mediastinal nodes. 3. Prominent subcentimeter right intraparotid node.     06/03/2020 -  Chemotherapy   The patient had rituximab for chemotherapy treatment.       REVIEW OF SYSTEMS:   Constitutional: Denies fevers, chills Eyes: Denies blurriness of vision Ears, nose, mouth, throat, and face: Denies mucositis or sore throat Respiratory: Denies cough, dyspnea or wheezes Cardiovascular: Denies palpitation, chest discomfort or lower extremity swelling Gastrointestinal:  Denies nausea, heartburn or change in bowel habits Skin: Denies abnormal skin rashes Lymphatics: Denies new lymphadenopathy or easy bruising Neurological:Denies numbness, tingling or new weaknesses Behavioral/Psych: Mood is stable, no new changes  All other systems were reviewed with the patient and are negative.  I have reviewed the past medical history, past surgical history, social history and family history with the patient and they are unchanged from previous note.  ALLERGIES:  is allergic to lipitor [atorvastatin].  MEDICATIONS:  Current Outpatient Medications  Medication Sig Dispense Refill  . acyclovir (ZOVIRAX) 400 MG tablet Take 1 tablet (400 mg total) by mouth daily. 30 tablet 3  . amiodarone (PACERONE) 200 MG tablet Take 1 tablet (200 mg total) by mouth daily. (Patient not taking: Reported on 05/01/2020) 90 tablet 3  . apixaban (ELIQUIS) 2.5 MG TABS tablet Take 2 tablets (5 mg total) by mouth 2 (two) times daily.    . hydrOXYzine (ATARAX/VISTARIL) 25 MG tablet Take 25 mg by mouth at bedtime.    Marland Kitchen JARDIANCE 10 MG TABS tablet TAKE 1 TABLET BY MOUTH EVERY DAY  (Patient not taking: Reported on 05/01/2020) 90 mg 3  . levothyroxine (SYNTHROID) 50 MCG tablet Take 50 mcg by mouth daily before breakfast.     . metoprolol succinate (TOPROL XL) 25 MG 24 hr tablet Take 0.5 tablets (12.5 mg total) by mouth daily. 45 tablet 3  . Omega-3 Fatty Acids (FISH OIL) 1200 MG CAPS Take 2,400 mg by mouth daily.  (Patient not taking: Reported on 05/01/2020)    . ondansetron (ZOFRAN) 8 MG tablet Take 1 tablet (8 mg total) by mouth every 8 (eight) hours as needed for refractory nausea / vomiting. Start on day 2 after bendamustine chemo. 30 tablet 1  . prochlorperazine (COMPAZINE) 10 MG tablet Take 1 tablet (10 mg total) by mouth every 6 (six) hours as needed (Nausea or vomiting). 30 tablet 1  . rosuvastatin (CRESTOR) 10 MG tablet Take 10 mg by mouth daily.    . tamsulosin (FLOMAX) 0.4 MG CAPS capsule Take 0.4 mg by mouth daily.    Marland Kitchen testosterone cypionate (DEPOTESTOSTERONE CYPIONATE) 200 MG/ML injection Inject 200 mg into the muscle every 14 (fourteen) days.      No current facility-administered medications for this visit.    PHYSICAL EXAMINATION: ECOG PERFORMANCE STATUS: 1 - Symptomatic but completely ambulatory  Vitals:   07/07/20 0940  BP: (!) 150/72  Pulse: (!) 59  Resp: 18  Temp: (!) 97.2 F (36.2 C)  SpO2: 99%  Filed Weights   07/07/20 0940  Weight: (!) 300 lb 12.8 oz (136.4 kg)    GENERAL:alert, no distress and comfortable SKIN: skin color, texture, turgor are normal, no rashes or significant lesions EYES: normal, Conjunctiva are pink and non-injected, sclera clear OROPHARYNX:no exudate, no erythema and lips, buccal mucosa, and tongue normal  NECK: He has slight persistent palpable lymphadenopathy but much improved compared to prior visit LYMPH:  no palpable lymphadenopathy in the cervical, axillary or inguinal LUNGS: clear to auscultation and percussion with normal breathing effort HEART: regular rate & rhythm and no murmurs and no lower extremity  edema ABDOMEN:abdomen soft, non-tender and normal bowel sounds Musculoskeletal:no cyanosis of digits and no clubbing  NEURO: alert & oriented x 3 with fluent speech, no focal motor/sensory deficits  LABORATORY DATA:  I have reviewed the data as listed    Component Value Date/Time   NA 136 07/07/2020 0926   NA 138 10/25/2018 1120   NA 140 06/25/2015 0758   K 4.5 07/07/2020 0926   K 3.9 06/25/2015 0758   CL 106 07/07/2020 0926   CL 101 03/11/2013 1031   CO2 23 07/07/2020 0926   CO2 25 06/25/2015 0758   GLUCOSE 141 (H) 07/07/2020 0926   GLUCOSE 140 06/25/2015 0758   GLUCOSE 173 (H) 03/11/2013 1031   BUN 27 (H) 07/07/2020 0926   BUN 16 10/25/2018 1120   BUN 15.9 06/25/2015 0758   CREATININE 2.14 (H) 07/07/2020 0926   CREATININE 2.20 (H) 06/01/2020 0957   CREATININE 1.0 06/25/2015 0758   CALCIUM 9.7 07/07/2020 0926   CALCIUM 9.3 06/25/2015 0758   PROT 7.1 07/07/2020 0926   PROT 6.7 06/25/2015 0758   ALBUMIN 4.0 07/07/2020 0926   ALBUMIN 3.8 06/25/2015 0758   AST 19 07/07/2020 0926   AST 15 06/01/2020 0957   AST 15 06/25/2015 0758   ALT 16 07/07/2020 0926   ALT 11 06/01/2020 0957   ALT 14 06/25/2015 0758   ALKPHOS 63 07/07/2020 0926   ALKPHOS 61 06/25/2015 0758   BILITOT 0.9 07/07/2020 0926   BILITOT 0.8 06/01/2020 0957   BILITOT 0.95 06/25/2015 0758   GFRNONAA 29 (L) 07/07/2020 0926   GFRNONAA 28 (L) 06/01/2020 0957   GFRAA 29 (L) 06/09/2020 0847   GFRAA 32 (L) 06/01/2020 0957    No results found for: SPEP, UPEP  Lab Results  Component Value Date   WBC 7.0 07/07/2020   NEUTROABS 5.1 07/07/2020   HGB 13.9 07/07/2020   HCT 40.3 07/07/2020   MCV 90.2 07/07/2020   PLT 146 (L) 07/07/2020      Chemistry      Component Value Date/Time   NA 136 07/07/2020 0926   NA 138 10/25/2018 1120   NA 140 06/25/2015 0758   K 4.5 07/07/2020 0926   K 3.9 06/25/2015 0758   CL 106 07/07/2020 0926   CL 101 03/11/2013 1031   CO2 23 07/07/2020 0926   CO2 25 06/25/2015 0758    BUN 27 (H) 07/07/2020 0926   BUN 16 10/25/2018 1120   BUN 15.9 06/25/2015 0758   CREATININE 2.14 (H) 07/07/2020 0926   CREATININE 2.20 (H) 06/01/2020 0957   CREATININE 1.0 06/25/2015 0758      Component Value Date/Time   CALCIUM 9.7 07/07/2020 0926   CALCIUM 9.3 06/25/2015 0758   ALKPHOS 63 07/07/2020 0926   ALKPHOS 61 06/25/2015 0758   AST 19 07/07/2020 0926   AST 15 06/01/2020 0957   AST 15 06/25/2015 0758  ALT 16 07/07/2020 0926   ALT 11 06/01/2020 0957   ALT 14 06/25/2015 0758   BILITOT 0.9 07/07/2020 0926   BILITOT 0.8 06/01/2020 0957   BILITOT 0.95 06/25/2015 0758

## 2020-07-07 NOTE — Assessment & Plan Note (Signed)
He has history of basal cell carcinoma of the scalp He has a new lesion on the right side of his cheek consistent with probable basal cell carcinoma He has appointment to see dermatologist tomorrow I would defer to dermatologist for biopsy and management

## 2020-07-07 NOTE — Assessment & Plan Note (Signed)
I recommend him to consider influenza vaccine today

## 2020-07-07 NOTE — Assessment & Plan Note (Signed)
He has recent weight loss I will adjust the dose of his treatment I suspect it could be due to loss of fluid weight as overall, he appears healthy.  He has no leg edema on exam today

## 2020-07-07 NOTE — Progress Notes (Signed)
Per Dr. Alvy Bimler, okay for patient to proceed with rituximab only today with creatine 2.14.

## 2020-07-07 NOTE — Assessment & Plan Note (Signed)
Overall, he tolerated single agent rituximab well The lymphadenopathy in the head and neck region has almost completely resolved Given his persistent elevated creatinine function, overall, I feel it is best for him to just continue on single agent immunotherapy only without bendamustine I recommend we proceed with 3 cycles of treatment and then repeat imaging study and determine his response based on the imaging study If he has excellent response to therapy, I see no reason to prescribe additional bendamustine However, if he has evidence of less than robust response to treatment, we can consider adding bendamustine in the future He is in agreement with the plan of care

## 2020-07-07 NOTE — Assessment & Plan Note (Addendum)
His creatinine is stable/mildly improved but that was to put him at chronic kidney disease stage IV As above, I plan to not proceed with chemotherapy for cycle 2 and 3 Continue risk factor management per nephrologist

## 2020-07-07 NOTE — Patient Instructions (Signed)
[facility] Discharge Instructions for Patients Receiving Chemotherapy  Today you received the following chemotherapy agents: rituximab-pvvr  To help prevent nausea and vomiting after your treatment, we encourage you to take your nausea medication as directed.   If you develop nausea and vomiting that is not controlled by your nausea medication, call the clinic.   BELOW ARE SYMPTOMS THAT SHOULD BE REPORTED IMMEDIATELY:  *FEVER GREATER THAN 100.5 F  *CHILLS WITH OR WITHOUT FEVER  NAUSEA AND VOMITING THAT IS NOT CONTROLLED WITH YOUR NAUSEA MEDICATION  *UNUSUAL SHORTNESS OF BREATH  *UNUSUAL BRUISING OR BLEEDING  TENDERNESS IN MOUTH AND THROAT WITH OR WITHOUT PRESENCE OF ULCERS  *URINARY PROBLEMS  *BOWEL PROBLEMS  UNUSUAL RASH Items with * indicate a potential emergency and should be followed up as soon as possible.  Feel free to call the clinic should you have any questions or concerns. The clinic phone number is [phone].  Please show the [facility] at check-in to the Emergency Department and triage nurse.  Rituximab injection What is this medicine? RITUXIMAB (ri TUX i mab) is a monoclonal antibody. It is used to treat certain types of cancer like non-Hodgkin lymphoma and chronic lymphocytic leukemia. It is also used to treat rheumatoid arthritis, granulomatosis with polyangiitis (or Wegener's granulomatosis), microscopic polyangiitis, and pemphigus vulgaris. This medicine may be used for other purposes; ask your health care provider or pharmacist if you have questions. COMMON BRAND NAME(S): Rituxan, RUXIENCE What should I tell my health care provider before I take this medicine? They need to know if you have any of these conditions:  heart disease  infection (especially a virus infection such as hepatitis B, chickenpox, cold sores, or herpes)  immune system problems  irregular heartbeat  kidney disease  low blood counts, like low white  cell, platelet, or red cell counts  lung or breathing disease, like asthma  recently received or scheduled to receive a vaccine  an unusual or allergic reaction to rituximab, other medicines, foods, dyes, or preservatives  pregnant or trying to get pregnant  breast-feeding How should I use this medicine? This medicine is for infusion into a vein. It is administered in a hospital or clinic by a specially trained health care professional. A special MedGuide will be given to you by the pharmacist with each prescription and refill. Be sure to read this information carefully each time. Talk to your pediatrician regarding the use of this medicine in children. This medicine is not approved for use in children. Overdosage: If you think you have taken too much of this medicine contact a poison control center or emergency room at once. NOTE: This medicine is only for you. Do not share this medicine with others. What if I miss a dose? It is important not to miss a dose. Call your doctor or health care professional if you are unable to keep an appointment. What may interact with this medicine?  cisplatin  live virus vaccines This list may not describe all possible interactions. Give your health care provider a list of all the medicines, herbs, non-prescription drugs, or dietary supplements you use. Also tell them if you smoke, drink alcohol, or use illegal drugs. Some items may interact with your medicine. What should I watch for while using this medicine? Your condition will be monitored carefully while you are receiving this medicine. You may need blood work done while you are taking this medicine. This medicine can cause serious allergic reactions. To reduce your risk you may  need to take medicine before treatment with this medicine. Take your medicine as directed. In some patients, this medicine may cause a serious brain infection that may cause death. If you have any problems seeing, thinking,  speaking, walking, or standing, tell your healthcare professional right away. If you cannot reach your healthcare professional, urgently seek other source of medical care. Call your doctor or health care professional for advice if you get a fever, chills or sore throat, or other symptoms of a cold or flu. Do not treat yourself. This drug decreases your body's ability to fight infections. Try to avoid being around people who are sick. Do not become pregnant while taking this medicine or for at least 12 months after stopping it. Women should inform their doctor if they wish to become pregnant or think they might be pregnant. There is a potential for serious side effects to an unborn child. Talk to your health care professional or pharmacist for more information. Do not breast-feed an infant while taking this medicine or for at least 6 months after stopping it. What side effects may I notice from receiving this medicine? Side effects that you should report to your doctor or health care professional as soon as possible:  allergic reactions like skin rash, itching or hives; swelling of the face, lips, or tongue  breathing problems  chest pain  changes in vision  diarrhea  headache with fever, neck stiffness, sensitivity to light, nausea, or confusion  fast, irregular heartbeat  loss of memory  low blood counts - this medicine may decrease the number of white blood cells, red blood cells and platelets. You may be at increased risk for infections and bleeding.  mouth sores  problems with balance, talking, or walking  redness, blistering, peeling or loosening of the skin, including inside the mouth  signs of infection - fever or chills, cough, sore throat, pain or difficulty passing urine  signs and symptoms of kidney injury like trouble passing urine or change in the amount of urine  signs and symptoms of liver injury like dark yellow or brown urine; general ill feeling or flu-like  symptoms; light-colored stools; loss of appetite; nausea; right upper belly pain; unusually weak or tired; yellowing of the eyes or skin  signs and symptoms of low blood pressure like dizziness; feeling faint or lightheaded, falls; unusually weak or tired  stomach pain  swelling of the ankles, feet, hands  unusual bleeding or bruising  vomiting Side effects that usually do not require medical attention (report to your doctor or health care professional if they continue or are bothersome):  headache  joint pain  muscle cramps or muscle pain  nausea  tiredness This list may not describe all possible side effects. Call your doctor for medical advice about side effects. You may report side effects to FDA at 1-800-FDA-1088. Where should I keep my medicine? This drug is given in a hospital or clinic and will not be stored at home. NOTE: This sheet is a summary. It may not cover all possible information. If you have questions about this medicine, talk to your doctor, pharmacist, or health care provider.  2020 Elsevier/Gold Standard (2018-10-17 22:01:36)

## 2020-07-07 NOTE — Assessment & Plan Note (Signed)
He has history of intermittent thrombocytopenia It is very mild He is not symptomatic Observe only There is no contraindication to remain on antiplatelet agents or anticoagulants as long as the platelet is greater than 50,000.

## 2020-07-07 NOTE — Progress Notes (Signed)
Aloxi and Dex deleted from premeds as pt is proceeding with Ruxience only now. Day 2 Bendamustine also deleted from the treatment plan.  Hardie Pulley, PharmD, BCPS, BCOP

## 2020-07-08 ENCOUNTER — Inpatient Hospital Stay: Payer: Medicare Other

## 2020-07-08 DIAGNOSIS — Z85828 Personal history of other malignant neoplasm of skin: Secondary | ICD-10-CM | POA: Diagnosis not present

## 2020-07-08 DIAGNOSIS — N200 Calculus of kidney: Secondary | ICD-10-CM | POA: Diagnosis not present

## 2020-07-08 DIAGNOSIS — C44222 Squamous cell carcinoma of skin of right ear and external auricular canal: Secondary | ICD-10-CM | POA: Diagnosis not present

## 2020-07-13 ENCOUNTER — Ambulatory Visit
Admission: RE | Admit: 2020-07-13 | Discharge: 2020-07-13 | Disposition: A | Discharge status: Home / Self Care | Payer: Medicare Other | Source: Ambulatory Visit | Attending: Internal Medicine | Admitting: Internal Medicine

## 2020-07-13 DIAGNOSIS — N3289 Other specified disorders of bladder: Secondary | ICD-10-CM | POA: Diagnosis not present

## 2020-07-13 DIAGNOSIS — N200 Calculus of kidney: Secondary | ICD-10-CM | POA: Diagnosis not present

## 2020-07-13 DIAGNOSIS — I129 Hypertensive chronic kidney disease with stage 1 through stage 4 chronic kidney disease, or unspecified chronic kidney disease: Secondary | ICD-10-CM

## 2020-07-13 DIAGNOSIS — N1832 Chronic kidney disease, stage 3b: Secondary | ICD-10-CM

## 2020-07-13 DIAGNOSIS — I10 Essential (primary) hypertension: Secondary | ICD-10-CM | POA: Diagnosis not present

## 2020-07-16 DIAGNOSIS — L570 Actinic keratosis: Secondary | ICD-10-CM | POA: Diagnosis not present

## 2020-07-17 DIAGNOSIS — N183 Chronic kidney disease, stage 3 unspecified: Secondary | ICD-10-CM | POA: Diagnosis not present

## 2020-07-17 DIAGNOSIS — E785 Hyperlipidemia, unspecified: Secondary | ICD-10-CM | POA: Diagnosis not present

## 2020-07-17 DIAGNOSIS — I2510 Atherosclerotic heart disease of native coronary artery without angina pectoris: Secondary | ICD-10-CM | POA: Diagnosis not present

## 2020-07-17 DIAGNOSIS — I10 Essential (primary) hypertension: Secondary | ICD-10-CM | POA: Diagnosis not present

## 2020-07-17 DIAGNOSIS — E119 Type 2 diabetes mellitus without complications: Secondary | ICD-10-CM | POA: Diagnosis not present

## 2020-07-17 DIAGNOSIS — N182 Chronic kidney disease, stage 2 (mild): Secondary | ICD-10-CM | POA: Diagnosis not present

## 2020-07-17 DIAGNOSIS — E782 Mixed hyperlipidemia: Secondary | ICD-10-CM | POA: Diagnosis not present

## 2020-07-17 DIAGNOSIS — E1122 Type 2 diabetes mellitus with diabetic chronic kidney disease: Secondary | ICD-10-CM | POA: Diagnosis not present

## 2020-07-17 DIAGNOSIS — N400 Enlarged prostate without lower urinary tract symptoms: Secondary | ICD-10-CM | POA: Diagnosis not present

## 2020-07-17 DIAGNOSIS — E039 Hypothyroidism, unspecified: Secondary | ICD-10-CM | POA: Diagnosis not present

## 2020-07-21 ENCOUNTER — Encounter: Payer: Self-pay | Admitting: Cardiology

## 2020-07-21 ENCOUNTER — Other Ambulatory Visit: Payer: Self-pay

## 2020-07-21 ENCOUNTER — Ambulatory Visit (INDEPENDENT_AMBULATORY_CARE_PROVIDER_SITE_OTHER): Payer: Medicare Other | Admitting: Cardiology

## 2020-07-21 VITALS — BP 140/68 | HR 55 | Ht 76.0 in | Wt 323.0 lb

## 2020-07-21 DIAGNOSIS — E1122 Type 2 diabetes mellitus with diabetic chronic kidney disease: Secondary | ICD-10-CM | POA: Diagnosis not present

## 2020-07-21 DIAGNOSIS — E782 Mixed hyperlipidemia: Secondary | ICD-10-CM | POA: Diagnosis not present

## 2020-07-21 DIAGNOSIS — N400 Enlarged prostate without lower urinary tract symptoms: Secondary | ICD-10-CM | POA: Diagnosis not present

## 2020-07-21 DIAGNOSIS — N183 Chronic kidney disease, stage 3 unspecified: Secondary | ICD-10-CM | POA: Diagnosis not present

## 2020-07-21 DIAGNOSIS — N182 Chronic kidney disease, stage 2 (mild): Secondary | ICD-10-CM | POA: Diagnosis not present

## 2020-07-21 DIAGNOSIS — E119 Type 2 diabetes mellitus without complications: Secondary | ICD-10-CM

## 2020-07-21 DIAGNOSIS — E039 Hypothyroidism, unspecified: Secondary | ICD-10-CM | POA: Diagnosis not present

## 2020-07-21 DIAGNOSIS — E785 Hyperlipidemia, unspecified: Secondary | ICD-10-CM | POA: Diagnosis not present

## 2020-07-21 DIAGNOSIS — I480 Paroxysmal atrial fibrillation: Secondary | ICD-10-CM | POA: Diagnosis not present

## 2020-07-21 DIAGNOSIS — I10 Essential (primary) hypertension: Secondary | ICD-10-CM | POA: Diagnosis not present

## 2020-07-21 DIAGNOSIS — I2510 Atherosclerotic heart disease of native coronary artery without angina pectoris: Secondary | ICD-10-CM | POA: Diagnosis not present

## 2020-07-21 MED ORDER — RIVAROXABAN 20 MG PO TABS: 20.0000 mg | ORAL_TABLET | Freq: Every day | ORAL | 6 refills | Status: DC

## 2020-07-21 NOTE — Patient Instructions (Signed)
Medication Instructions:  Please discontinue your Eliquis. Start Xarelto 20 mg daily. Continue all other medications as listed.  *If you need a refill on your cardiac medications before your next appointment, please call your pharmacy*  Follow-Up: At [facility], you and your health needs are our priority.  As part of our continuing mission to provide you with exceptional heart care, we have created designated Provider Care Teams.  These Care Teams include your primary Cardiologist (physician) and Advanced Practice Providers (APPs -  Physician Assistants and Nurse Practitioners) who all work together to provide you with the care you need, when you need it.  We recommend signing up for the patient portal called "MyChart".  Sign up information is provided on this After Visit Summary.  MyChart is used to connect with patients for Virtual Visits (Telemedicine).  Patients are able to view lab/test results, encounter notes, upcoming appointments, etc.  Non-urgent messages can be sent to your provider as well.   To learn more about what you can do with MyChart, go to [email or web address].    Your next appointment:   6 month(s)  The format for your next appointment:   In Person  Provider:   [name], MD   Thank you for choosing [facility]!!

## 2020-07-21 NOTE — Progress Notes (Signed)
Cardiology Office Note:    Date:  07/21/2020   ID:  Derek [name], Derek [name] 01-08-1941, MRN [id number]  PCP:  Wenda Low, MD  [facility] Cardiologist:  [name], MD  [facility] Electrophysiologist:  None   Referring MD: Wenda Low, MD     History of Present Illness:    Derek [name] is a 79 y.o. male here for the follow-up of paroxysmal atrial fibrillation, cardioversion and 2020.  Maintaining sinus rhythm first-degree AV block with borderline left bundle branch block.  Overall feels well no fevers chills nausea vomiting syncope bleeding  Prior notes He also has diabetes with hypertension coronary artery disease CLL hyperlipidemia.  Prior cardiac catheterization demonstrated an 85% and 95% stenosis after a large diagonal branch and dual 85% stenosis in the diagonal branch and occluded second obtuse marginal.  Medical management ensued.  Atrial flutter was new to him in December 2019.  Eliquis was used and TEE cardioversion took place.  EF 40 to 45%.  He was back in atrial fibrillation with rapid ventricular rate following the original cardioversion and he was placed on amiodarone at that time to see if we can maintain sinus rhythm.  Baseline PFTs did demonstrate reduced DLCO and pulmonary referral has taken place.  He has a drug rash from Lipitor according to his dermatologist.  He has stopped this.  He seems to be taking Crestor 10 mg on his medication list. Unsure. He also has stopped taking his Eliquis because it is too expensive. He is getting some samples occasionally from different doctors but this is not a viable option. I am going to try Xarelto.    Past Medical History:  Diagnosis Date  . Blood dyscrasia    cll remission  . 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 Diabetes mellitus, type II ([place])   . Dilated cardiomyopathy ([place]) 09/14/2018   AFlutter >> EF 40-45 pre DCCV and 45-50 post DCCV in 08/2018 // probable tachycardia mediated.  Marland Kitchen  GERD (gastroesophageal reflux disease)    occ tums  . H/O cardiac catheterization   . H/O exercise stress test 1999, 2002  . Hypertension   . Hypertriglyceridemia   . Lymphocytosis   . Obesity   . Pulmonary Function Test 09/2018   PFTs 09/2018:  FEV1 88% predicted; FEV1/FVC 78%; DLCO cor 58    Past Surgical History:  Procedure Laterality Date  . CARDIOVERSION N/A 09/05/2018   Procedure: CARDIOVERSION;  Surgeon: Jerline Pain, MD;  Location: [facility] ENDOSCOPY;  Service: Cardiovascular;  Laterality: N/A;  . CARDIOVERSION N/A 10/22/2018   Procedure: CARDIOVERSION;  Surgeon: Jerline Pain, MD;  Location: [facility] ENDOSCOPY;  Service: Cardiovascular;  Laterality: N/A;  . CHOLECYSTECTOMY N/A 07/12/2016   Procedure: LAPAROSCOPIC CHOLECYSTECTOMY;  Surgeon: Donnie Mesa, MD;  Location: [place];  Service: General;  Laterality: N/A;  . EXTRACORPOREAL SHOCK WAVE LITHOTRIPSY Right 11/18/2019   Procedure: EXTRACORPOREAL SHOCK WAVE LITHOTRIPSY (ESWL);  Surgeon: Lucas Mallow, MD;  Location: [facility];  Service: Urology;  Laterality: Right;  . FOOT SURGERY Right   . NASAL SINUS SURGERY    . TEE WITHOUT CARDIOVERSION N/A 09/05/2018   Procedure: TRANSESOPHAGEAL ECHOCARDIOGRAM (TEE);  Surgeon: Jerline Pain, MD;  Location: [facility] ENDOSCOPY;  Service: Cardiovascular;  Laterality: N/A;  . TONSILLECTOMY    . UMBILICAL HERNIA REPAIR N/A 07/12/2016   Procedure: UMBILICAL HERINA REPAIR;  Surgeon: Donnie Mesa, MD;  Location: [place];  Service: General;  Laterality: N/A;  . vericose vein stripping  Current Medications: Current Meds  Medication Sig  . acyclovir (ZOVIRAX) 400 MG tablet Take 1 tablet (400 mg total) by mouth daily.  Marland Kitchen amiodarone (PACERONE) 200 MG tablet Take 1 tablet (200 mg total) by mouth daily.  . hydrOXYzine (ATARAX/VISTARIL) 25 MG tablet Take 25 mg by mouth at bedtime.  Marland Kitchen JARDIANCE 10 MG TABS tablet TAKE 1 TABLET BY MOUTH EVERY DAY  . levothyroxine (SYNTHROID) 50 MCG tablet Take  50 mcg by mouth daily before breakfast.   . metoprolol succinate (TOPROL XL) 25 MG 24 hr tablet Take 0.5 tablets (12.5 mg total) by mouth daily.  . Omega-3 Fatty Acids (FISH OIL) 1200 MG CAPS Take 2,400 mg by mouth daily.   . ondansetron (ZOFRAN) 8 MG tablet Take 1 tablet (8 mg total) by mouth every 8 (eight) hours as needed for refractory nausea / vomiting. Start on day 2 after bendamustine chemo.  . prochlorperazine (COMPAZINE) 10 MG tablet Take 1 tablet (10 mg total) by mouth every 6 (six) hours as needed (Nausea or vomiting).  . rosuvastatin (CRESTOR) 10 MG tablet Take 10 mg by mouth daily.  . tamsulosin (FLOMAX) 0.4 MG CAPS capsule Take 0.4 mg by mouth daily.  . [DISCONTINUED] apixaban (ELIQUIS) 2.5 MG TABS tablet Take 2 tablets (5 mg total) by mouth 2 (two) times daily.     Allergies:   Lipitor [atorvastatin]   Social History   Socioeconomic History  . Marital status: Married    Spouse name: Not on file  . Number of children: 3  . Years of education: Not on file  . Highest education level: Not on file  Occupational History    Employer: [employer]    Comment: retied sale; asphalt work   Tobacco Use  . Smoking status: Former Smoker    Packs/day: 3.00    Years: 10.00    Pack years: 30.00    Quit date: 09/19/1988    Years since quitting: 31.8  . Smokeless tobacco: Never Used  Vaping Use  . Vaping Use: Never used  Substance and Sexual Activity  . Alcohol use: Yes    Alcohol/week: 14.0 standard drinks    Types: 14 Shots of liquor per week  . Drug use: No  . Sexual activity: Not on file  Other Topics Concern  . Not on file  Social History Narrative  . Not on file   Social Determinants of Health   Financial Resource Strain:   . Difficulty of Paying Living Expenses: Not on file  Food Insecurity:   . Worried About Charity fundraiser in the Last Year: Not on file  . Ran Out of Food in the Last Year: Not on file  Transportation Needs:   . Lack of Transportation  (Medical): Not on file  . Lack of Transportation (Non-Medical): Not on file  Physical Activity:   . Days of Exercise per Week: Not on file  . Minutes of Exercise per Session: Not on file  Stress:   . Feeling of Stress : Not on file  Social Connections:   . Frequency of Communication with Friends and Family: Not on file  . Frequency of Social Gatherings with Friends and Family: Not on file  . Attends Religious Services: Not on file  . Active Member of Clubs or Organizations: Not on file  . Attends Archivist Meetings: Not on file  . Marital Status: Not on file     Family History: The patient's family history includes Cancer in his father; Heart  attack in his mother.  ROS:   Please see the history of present illness.     All other systems reviewed and are negative.  EKGs/Labs/Other Studies Reviewed:    The following studies were reviewed today:  PFTs 09/20/2018  FEV1 88% predicted;FEV1/FVC 78%; DLCO cor 58  TEE/DCCV 09/05/18 EF 40-45, ant-sept HK, mild MR, no LAA clot, atrial septal lipomatous hypertrophy Post DCCV >> EF appeared to be improved to 45-50  Cardiac Catheterization5/1/14 LM ok LAD 85 then 95 immediately after large Dx branch; Dx with 85 x 2 in prox/mid vessel LCx irregs; OM2 mid 100 with L-L collats RCA irregs; PLB (small) prox 90 with R-R collats EF 55  Nuclear stress test2014 Mild ischemia in the basal inferolateral to mid inferolateral region. Moderate risk myocardial perfusion study.  Myoview 11/02 Normal, EF 56  EKG:  EKG is  ordered today.  The ekg ordered today demonstrates sinus rhythm 55 left bundle branch block-like appearance  Recent Labs: 07/07/2020: ALT 16; BUN 27; Creatinine, Ser 2.14; Hemoglobin 13.9; Platelets 146; Potassium 4.5; Sodium 136  Recent Lipid Panel No results found for: CHOL, TRIG, HDL, CHOLHDL, VLDL, LDLCALC, LDLDIRECT   Physical Exam:    VS:  BP 140/68   Pulse (!) 55   Ht 6\' 4"  (1.93 m)   Wt (!) 323  lb (146.5 kg)   SpO2 98%   BMI 39.32 kg/m     Wt Readings from Last 3 Encounters:  07/21/20 (!) 323 lb (146.5 kg)  07/07/20 (!) 300 lb 12.8 oz (136.4 kg)  06/09/20 (!) 317 lb (143.8 kg)     GEN:  Well nourished, well developed in no acute distress, obese HEENT: Normal NECK: No JVD; No carotid bruits LYMPHATICS: No lymphadenopathy CARDIAC: RRR, no murmurs, rubs, gallops RESPIRATORY:  Clear to auscultation without rales, wheezing or rhonchi  ABDOMEN: Soft, non-tender, non-distended MUSCULOSKELETAL:  No edema; No deformity  SKIN: Warm and dry NEUROLOGIC:  Alert and oriented x 3 PSYCHIATRIC:  Normal affect   ASSESSMENT:    1. Paroxysmal atrial fibrillation (HCC)   2. Morbid obesity ([place])   3. Type 2 diabetes mellitus without complication, without long-term current use of insulin (HCC)    PLAN:    In order of problems listed above:  Paroxysmal atrial flutter -Cardioversion successful on 10/22/2018 -Heart rate previously was in the 40s with atrial flutter.  Currently 55 in sinus.  On amiodarone 200 mg a day as well as Toprol 25 cut in half at 12.5 mg a day. He has not been taking Eliquis because of cost. -I will switch him to Xarelto 20 mg once a day given his creatinine clearance of 59 with creat of 2.14. Hopefully we can give him the co-pay card.  Coronary artery disease -Disease in the LAD diagonal and circumflex in 2014. Treated medically. Encouraged statin use.  Crestor 10 mg a day is on his medication list. Last LDL 113. Should be 70 or less. Worried about medical compliance.   Alcohol use -Encourage cessation  Morbid obesity -Encourage weight loss.  We will contact him to verify his medication list.  He is unsure what medicines he is taking currently.   Medication Adjustments/Labs and Tests Ordered: Current medicines are reviewed at length with the patient today.  Concerns regarding medicines are outlined above.  Orders Placed This Encounter  Procedures  . EKG  12-Lead   Meds ordered this encounter  Medications  . rivaroxaban (XARELTO) 20 MG TABS tablet    Sig: Take 1 tablet (  20 mg total) by mouth daily with supper.    Dispense:  30 tablet    Refill:  6    Patient Instructions  Medication Instructions:  Please discontinue your Eliquis. Start Xarelto 20 mg daily. Continue all other medications as listed.  *If you need a refill on your cardiac medications before your next appointment, please call your pharmacy*  Follow-Up: At [facility], you and your health needs are our priority.  As part of our continuing mission to provide you with exceptional heart care, we have created designated Provider Care Teams.  These Care Teams include your primary Cardiologist (physician) and Advanced Practice Providers (APPs -  Physician Assistants and Nurse Practitioners) who all work together to provide you with the care you need, when you need it.  We recommend signing up for the patient portal called "MyChart".  Sign up information is provided on this After Visit Summary.  MyChart is used to connect with patients for Virtual Visits (Telemedicine).  Patients are able to view lab/test results, encounter notes, upcoming appointments, etc.  Non-urgent messages can be sent to your provider as well.   To learn more about what you can do with MyChart, go to [email or web address].    Your next appointment:   6 month(s)  The format for your next appointment:   In Person  Provider:   [name], MD   Thank you for choosing [facility]!!        Signed, [name], MD  07/21/2020 5:08 PM    [place]

## 2020-07-23 ENCOUNTER — Telehealth: Payer: Self-pay | Admitting: Pharmacist

## 2020-07-23 NOTE — Telephone Encounter (Signed)
I called [facility] and gave a verbal for Xarelto 20mg  one tablet daily at supper # 90 w/ 3 refills. To be filled under [facility]. Provider [name]

## 2020-07-27 DIAGNOSIS — H25813 Combined forms of age-related cataract, bilateral: Secondary | ICD-10-CM | POA: Diagnosis not present

## 2020-07-27 DIAGNOSIS — E1136 Type 2 diabetes mellitus with diabetic cataract: Secondary | ICD-10-CM | POA: Diagnosis not present

## 2020-08-04 ENCOUNTER — Inpatient Hospital Stay: Payer: Medicare Other | Attending: Hematology and Oncology

## 2020-08-04 ENCOUNTER — Telehealth: Payer: Self-pay | Admitting: Hematology and Oncology

## 2020-08-04 ENCOUNTER — Inpatient Hospital Stay (HOSPITAL_BASED_OUTPATIENT_CLINIC_OR_DEPARTMENT_OTHER): Payer: Medicare Other | Admitting: Hematology and Oncology

## 2020-08-04 ENCOUNTER — Encounter: Payer: Self-pay | Admitting: Hematology and Oncology

## 2020-08-04 ENCOUNTER — Inpatient Hospital Stay: Payer: Medicare Other

## 2020-08-04 ENCOUNTER — Other Ambulatory Visit: Payer: Self-pay

## 2020-08-04 VITALS — BP 142/58 | HR 64 | Temp 98.2°F | Resp 18

## 2020-08-04 VITALS — BP 145/65 | HR 66 | Temp 98.4°F | Resp 18 | Ht 76.0 in | Wt 316.0 lb

## 2020-08-04 DIAGNOSIS — N184 Chronic kidney disease, stage 4 (severe): Secondary | ICD-10-CM | POA: Insufficient documentation

## 2020-08-04 DIAGNOSIS — N3289 Other specified disorders of bladder: Secondary | ICD-10-CM | POA: Insufficient documentation

## 2020-08-04 DIAGNOSIS — Z7901 Long term (current) use of anticoagulants: Secondary | ICD-10-CM | POA: Diagnosis not present

## 2020-08-04 DIAGNOSIS — Z9114 Patient's other noncompliance with medication regimen: Secondary | ICD-10-CM | POA: Insufficient documentation

## 2020-08-04 DIAGNOSIS — N200 Calculus of kidney: Secondary | ICD-10-CM | POA: Diagnosis not present

## 2020-08-04 DIAGNOSIS — I129 Hypertensive chronic kidney disease with stage 1 through stage 4 chronic kidney disease, or unspecified chronic kidney disease: Secondary | ICD-10-CM | POA: Insufficient documentation

## 2020-08-04 DIAGNOSIS — C9110 Chronic lymphocytic leukemia of B-cell type not having achieved remission: Secondary | ICD-10-CM

## 2020-08-04 DIAGNOSIS — Z9119 Patient's noncompliance with other medical treatment and regimen: Secondary | ICD-10-CM | POA: Insufficient documentation

## 2020-08-04 DIAGNOSIS — R339 Retention of urine, unspecified: Secondary | ICD-10-CM | POA: Insufficient documentation

## 2020-08-04 DIAGNOSIS — Z5112 Encounter for antineoplastic immunotherapy: Secondary | ICD-10-CM | POA: Diagnosis not present

## 2020-08-04 DIAGNOSIS — Z7189 Other specified counseling: Secondary | ICD-10-CM

## 2020-08-04 DIAGNOSIS — Z79899 Other long term (current) drug therapy: Secondary | ICD-10-CM | POA: Insufficient documentation

## 2020-08-04 LAB — CBC WITH DIFFERENTIAL/PLATELET
Abs Immature Granulocytes: 0.03 10*3/uL (ref 0.00–0.07)
Basophils Absolute: 0 10*3/uL (ref 0.0–0.1)
Basophils Relative: 1 %
Eosinophils Absolute: 0.2 10*3/uL (ref 0.0–0.5)
Eosinophils Relative: 2 %
HCT: 39.5 % (ref 39.0–52.0)
Hemoglobin: 13.7 g/dL (ref 13.0–17.0)
Immature Granulocytes: 0 %
Lymphocytes Relative: 14 %
Lymphs Abs: 0.9 10*3/uL (ref 0.7–4.0)
MCH: 30.9 pg (ref 26.0–34.0)
MCHC: 34.7 g/dL (ref 30.0–36.0)
MCV: 89.2 fL (ref 80.0–100.0)
Monocytes Absolute: 0.6 10*3/uL (ref 0.1–1.0)
Monocytes Relative: 9 %
Neutro Abs: 4.9 10*3/uL (ref 1.7–7.7)
Neutrophils Relative %: 74 %
Platelets: 154 10*3/uL (ref 150–400)
RBC: 4.43 MIL/uL (ref 4.22–5.81)
RDW: 13.5 % (ref 11.5–15.5)
WBC: 6.7 10*3/uL (ref 4.0–10.5)
nRBC: 0 % (ref 0.0–0.2)

## 2020-08-04 LAB — COMPREHENSIVE METABOLIC PANEL
ALT: 12 U/L (ref 0–44)
AST: 14 U/L — ABNORMAL LOW (ref 15–41)
Albumin: 3.9 g/dL (ref 3.5–5.0)
Alkaline Phosphatase: 69 U/L (ref 38–126)
Anion gap: 10 (ref 5–15)
BUN: 24 mg/dL — ABNORMAL HIGH (ref 8–23)
CO2: 17 mmol/L — ABNORMAL LOW (ref 22–32)
Calcium: 9.2 mg/dL (ref 8.9–10.3)
Chloride: 110 mmol/L (ref 98–111)
Creatinine, Ser: 2.18 mg/dL — ABNORMAL HIGH (ref 0.61–1.24)
GFR, Estimated: 30 mL/min — ABNORMAL LOW (ref >60)
Glucose, Bld: 222 mg/dL — ABNORMAL HIGH (ref 70–99)
Potassium: 4.4 mmol/L (ref 3.5–5.1)
Sodium: 137 mmol/L (ref 135–145)
Total Bilirubin: 1 mg/dL (ref 0.3–1.2)
Total Protein: 7 g/dL (ref 6.5–8.1)

## 2020-08-04 LAB — URIC ACID: Uric Acid, Serum: 7 mg/dL (ref 3.7–8.6)

## 2020-08-04 MED ORDER — DIPHENHYDRAMINE HCL 25 MG PO CAPS
ORAL_CAPSULE | ORAL | Status: AC
  Filled 2020-08-04: qty 1

## 2020-08-04 MED ORDER — ACETAMINOPHEN 325 MG PO TABS
650.0000 mg | ORAL_TABLET | Freq: Once | ORAL | Status: AC
  Administered 2020-08-04: 650 mg via ORAL

## 2020-08-04 MED ORDER — DIPHENHYDRAMINE HCL 25 MG PO CAPS
25.0000 mg | ORAL_CAPSULE | Freq: Once | ORAL | Status: AC
  Administered 2020-08-04: 25 mg via ORAL

## 2020-08-04 MED ORDER — SODIUM CHLORIDE 0.9 % IV SOLN
375.0000 mg/m2 | Freq: Once | INTRAVENOUS | Status: AC
  Administered 2020-08-04: 1000 mg via INTRAVENOUS
  Filled 2020-08-04: qty 100

## 2020-08-04 MED ORDER — SODIUM CHLORIDE 0.9 % IV SOLN
Freq: Once | INTRAVENOUS | Status: AC
  Filled 2020-08-04: qty 250

## 2020-08-04 MED ORDER — ACETAMINOPHEN 325 MG PO TABS
ORAL_TABLET | ORAL | Status: AC
  Filled 2020-08-04: qty 2

## 2020-08-04 NOTE — Telephone Encounter (Signed)
Scheduled appts per 11/16 sch msg. Gave pt a print out of AVS.  °

## 2020-08-04 NOTE — Progress Notes (Signed)
[name] OFFICE PROGRESS NOTE  Patient Care Team: Wenda Low, MD as PCP - General (Internal Medicine) Jerline Pain, MD as PCP - Cardiology (Cardiology) Jerline Pain, MD as Attending Physician (Cardiology)  ASSESSMENT & PLAN:  CLL (chronic lymphocytic leukemia) Clinically, he is responding to treatment The lymph node on the left side of his neck is getting smaller We will proceed with rituximab only I plan to repeat imaging study before his next dose of treatment and I will see him back next month for further follow-up  Chronic kidney disease (CKD), 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[place]) His creatinine is stable/mildly improved but that was to put him at chronic kidney disease stage IV As above, I plan to not proceed with chemotherapy for cycle 2 and 3 Continue risk factor management per nephrologist  Non compliance w medication regimen The patient has been noncompliance with medication He did not take allopurinol or acyclovir I explained to the patient the rationale of prescribing these medications for him He is also noncompliant taking his medications that were prescribed by his cardiologist He is also not interested to quit drinking We discussed goals of care in treating his medical illness   Orders Placed This Encounter  Procedures  . CT Soft Tissue Neck Wo Contrast    Standing Status:   Future    Standing Expiration Date:   08/04/2021    Order Specific Question:   Preferred imaging location?    Answer:   [facility]  . CT CHEST ABDOMEN PELVIS WO CONTRAST    Standing Status:   Future    Standing Expiration Date:   08/04/2021    Order Specific Question:   If indicated for the ordered procedure, I authorize the administration of contrast media per Radiology protocol    Answer:   Yes    Order Specific Question:   Preferred imaging location?    Answer:   [facility]    Order Specific Question:   Is Oral Contrast requested for this exam?     Answer:   Yes, Per Radiology protocol    Order Specific Question:   Reason for Exam (SYMPTOM  OR DIAGNOSIS REQUIRED)    Answer:   cll on immunotherapy assess response to treatment    All questions were answered. The patient knows to call the clinic with any problems, questions or concerns. The total time spent in the appointment was 20 minutes encounter with patients including review of chart and various tests results, discussions about plan of care and coordination of care plan   [name], MD 08/04/2020 10:59 AM  INTERVAL HISTORY: Please see below for problem oriented charting. He returns with family for further follow-up He is not taking a bunch of his medications, specifically, he did not take allopurinol or acyclovir He is also not taking medications prescribed by his cardiologist He is still drinking and is not interested to quit Denies infusion reaction  SUMMARY OF ONCOLOGIC HISTORY: Oncology History Overview Note  Normal FISH   CLL (chronic lymphocytic leukemia) ([name])  09/10/2013 Initial Diagnosis   CLL (chronic lymphocytic leukemia)   12/26/2013 Pathology Results   FISH analysis was normal.   06/17/2014 Procedure   The patient has placement of Infuse-a-Port.   06/18/2014 Imaging   CT scan of the chest, abdomen and pelvis showed diffuse lymphadenopathy and splenomegaly.   06/19/2014 Bone Marrow Biopsy   Bone marrow aspirate and biopsy show CLL.   06/24/2014 - 11/18/2014 Chemotherapy   He received  6 cycles of Obinutuzumab and chlorambucil   09/15/2014 Imaging   Repeat CT scan of the chest, abdomen and pelvis show greater than 50% reduction in lymphadenopathy and splenomegaly   12/19/2014 Imaging   Repeat CT scan showed complete resolution of lymphadenopathy and splenomegaly.   03/09/2020 Cancer Staging   Staging form: Chronic Lymphocytic Leukemia / Small Lymphocytic Lymphoma, AJCC 8th Edition - Clinical stage from 03/09/2020: Modified Rai Stage I (Modified Rai risk:  Intermediate, Lymphocytosis: Present, Adenopathy: Present, Organomegaly: Absent, Anemia: Absent, Thrombocytopenia: Absent) - Signed by [name], MD on 03/09/2020   04/22/2020 Imaging   CT neck 1. Cervical adenopathy involving the bilateral level 1B, right 2/3 and bilateral level 5 nodal stations. 2. Prominent to mildly enlarged upper mediastinal nodes. 3. Prominent subcentimeter right intraparotid node.     06/03/2020 -  Chemotherapy   The patient had rituximab for chemotherapy treatment.       REVIEW OF SYSTEMS:   Constitutional: Denies fevers, chills or abnormal weight loss Eyes: Denies blurriness of vision Ears, nose, mouth, throat, and face: Denies mucositis or sore throat Respiratory: Denies cough, dyspnea or wheezes Cardiovascular: Denies palpitation, chest discomfort or lower extremity swelling Gastrointestinal:  Denies nausea, heartburn or change in bowel habits Skin: Denies abnormal skin rashes Lymphatics: Denies new lymphadenopathy or easy bruising Neurological:Denies numbness, tingling or new weaknesses Behavioral/Psych: Mood is stable, no new changes  All other systems were reviewed with the patient and are negative.  I have reviewed the past medical history, past surgical history, social history and family history with the patient and they are unchanged from previous note.  ALLERGIES:  is allergic to lipitor [atorvastatin].  MEDICATIONS:  Current Outpatient Medications  Medication Sig Dispense Refill  . acyclovir (ZOVIRAX) 400 MG tablet Take 1 tablet (400 mg total) by mouth daily. 30 tablet 3  . amiodarone (PACERONE) 200 MG tablet Take 1 tablet (200 mg total) by mouth daily. 90 tablet 3  . hydrOXYzine (ATARAX/VISTARIL) 25 MG tablet Take 25 mg by mouth at bedtime.    Marland Kitchen JARDIANCE 10 MG TABS tablet TAKE 1 TABLET BY MOUTH EVERY DAY 90 mg 3  . levothyroxine (SYNTHROID) 50 MCG tablet Take 50 mcg by mouth daily before breakfast.     . metoprolol succinate (TOPROL XL) 25  MG 24 hr tablet Take 0.5 tablets (12.5 mg total) by mouth daily. 45 tablet 3  . Omega-3 Fatty Acids (FISH OIL) 1200 MG CAPS Take 2,400 mg by mouth daily.     . ondansetron (ZOFRAN) 8 MG tablet Take 1 tablet (8 mg total) by mouth every 8 (eight) hours as needed for refractory nausea / vomiting. Start on day 2 after bendamustine chemo. 30 tablet 1  . prochlorperazine (COMPAZINE) 10 MG tablet Take 1 tablet (10 mg total) by mouth every 6 (six) hours as needed (Nausea or vomiting). 30 tablet 1  . rivaroxaban (XARELTO) 20 MG TABS tablet Take 1 tablet (20 mg total) by mouth daily with supper. 30 tablet 6  . rosuvastatin (CRESTOR) 10 MG tablet Take 10 mg by mouth daily.    . tamsulosin (FLOMAX) 0.4 MG CAPS capsule Take 0.4 mg by mouth daily.     No current facility-administered medications for this visit.    PHYSICAL EXAMINATION: ECOG PERFORMANCE STATUS: 1 - Symptomatic but completely ambulatory  Vitals:   08/04/20 1053  BP: (!) 145/65  Pulse: 66  Resp: 18  Temp: 98.4 F (36.9 C)  SpO2: 100%   Filed Weights   08/04/20 1053  Weight: Marland Kitchen)  316 lb (143.3 kg)    GENERAL:alert, no distress and comfortable SKIN: skin color, texture, turgor are normal, no rashes or significant lesions EYES: normal, Conjunctiva are pink and non-injected, sclera clear OROPHARYNX:no exudate, no erythema and lips, buccal mucosa, and tongue normal  NECK: He has palpable lymphadenopathy on the left submandibular region, smaller compared to previous visit  LYMPH:  no palpable lymphadenopathy in the cervical, axillary or inguinal LUNGS: clear to auscultation and percussion with normal breathing effort HEART: regular rate & rhythm and no murmurs and no lower extremity edema ABDOMEN:abdomen soft, non-tender and normal bowel sounds Musculoskeletal:no cyanosis of digits and no clubbing  NEURO: alert & oriented x 3 with fluent speech, no focal motor/sensory deficits  LABORATORY DATA:  I have reviewed the data as listed     Component Value Date/Time   NA 136 07/07/2020 0926   NA 138 10/25/2018 1120   NA 140 06/25/2015 0758   K 4.5 07/07/2020 0926   K 3.9 06/25/2015 0758   CL 106 07/07/2020 0926   CL 101 03/11/2013 1031   CO2 23 07/07/2020 0926   CO2 25 06/25/2015 0758   GLUCOSE 141 (H) 07/07/2020 0926   GLUCOSE 140 06/25/2015 0758   GLUCOSE 173 (H) 03/11/2013 1031   BUN 27 (H) 07/07/2020 0926   BUN 16 10/25/2018 1120   BUN 15.9 06/25/2015 0758   CREATININE 2.14 (H) 07/07/2020 0926   CREATININE 2.20 (H) 06/01/2020 0957   CREATININE 1.0 06/25/2015 0758   CALCIUM 9.7 07/07/2020 0926   CALCIUM 9.3 06/25/2015 0758   PROT 7.1 07/07/2020 0926   PROT 6.7 06/25/2015 0758   ALBUMIN 4.0 07/07/2020 0926   ALBUMIN 3.8 06/25/2015 0758   AST 19 07/07/2020 0926   AST 15 06/01/2020 0957   AST 15 06/25/2015 0758   ALT 16 07/07/2020 0926   ALT 11 06/01/2020 0957   ALT 14 06/25/2015 0758   ALKPHOS 63 07/07/2020 0926   ALKPHOS 61 06/25/2015 0758   BILITOT 0.9 07/07/2020 0926   BILITOT 0.8 06/01/2020 0957   BILITOT 0.95 06/25/2015 0758   GFRNONAA 29 (L) 07/07/2020 0926   GFRNONAA 28 (L) 06/01/2020 0957   GFRAA 29 (L) 06/09/2020 0847   GFRAA 32 (L) 06/01/2020 0957    No results found for: SPEP, UPEP  Lab Results  Component Value Date   WBC 6.7 08/04/2020   NEUTROABS 4.9 08/04/2020   HGB 13.7 08/04/2020   HCT 39.5 08/04/2020   MCV 89.2 08/04/2020   PLT 154 08/04/2020      Chemistry      Component Value Date/Time   NA 136 07/07/2020 0926   NA 138 10/25/2018 1120   NA 140 06/25/2015 0758   K 4.5 07/07/2020 0926   K 3.9 06/25/2015 0758   CL 106 07/07/2020 0926   CL 101 03/11/2013 1031   CO2 23 07/07/2020 0926   CO2 25 06/25/2015 0758   BUN 27 (H) 07/07/2020 0926   BUN 16 10/25/2018 1120   BUN 15.9 06/25/2015 0758   CREATININE 2.14 (H) 07/07/2020 0926   CREATININE 2.20 (H) 06/01/2020 0957   CREATININE 1.0 06/25/2015 0758      Component Value Date/Time   CALCIUM 9.7 07/07/2020 0926    CALCIUM 9.3 06/25/2015 0758   ALKPHOS 63 07/07/2020 0926   ALKPHOS 61 06/25/2015 0758   AST 19 07/07/2020 0926   AST 15 06/01/2020 0957   AST 15 06/25/2015 0758   ALT 16 07/07/2020 0926   ALT 11 06/01/2020 0957  ALT 14 06/25/2015 0758   BILITOT 0.9 07/07/2020 0926   BILITOT 0.8 06/01/2020 0957   BILITOT 0.95 06/25/2015 0758       RADIOGRAPHIC STUDIES: I have personally reviewed the radiological images as listed and agreed with the findings in the report. US RENAL  Result Date: 07/14/2020 CLINICAL DATA:  Stage III B chronic kidney disease.  Hypertension. EXAM: RENAL / URINARY TRACT ULTRASOUND COMPLETE COMPARISON:  Noncontrast abdominal CT 01/31/2020 FINDINGS: Right Kidney: Renal measurements: 12.6 x 4.7 x 6.0 cm = volume: 186 mL. Mild thinning of the renal parenchyma but no increased renal echogenicity. Shadowing echogenic calculus in the lower kidney measures 1.2 cm. There is an additional smaller stone in the mid kidney measuring approximately 4 mm. No hydronephrosis. Previous renal hematoma and CT is not definitively seen and has likely resolved. There is no evidence of renal mass. Left Kidney: Renal measurements: 13.7 x 5.8 x 6.4 cm = volume: 267 mL. Slightly lobulated contours. Echogenicity within normal limits. No mass or hydronephrosis visualized. No visualized renal calculi. Bladder: Appears normal for degree of bladder distention. Only minimally distended. Other: None. IMPRESSION: 1. No hydronephrosis. 2. Nonobstructing stones in the right kidney. Mild thinning of the right renal parenchyma. 3. Unremarkable sonographic appearance of the left kidney. Electronically Signed   By: Keith Rake M.D.   On: 07/14/2020 23:34

## 2020-08-04 NOTE — Assessment & Plan Note (Signed)
His creatinine is stable/mildly improved but that was to put him at chronic kidney disease stage IV As above, I plan to not proceed with chemotherapy for cycle 2 and 3 Continue risk factor management per nephrologist

## 2020-08-04 NOTE — Assessment & Plan Note (Signed)
The patient has been noncompliance with medication He did not take allopurinol or acyclovir I explained to the patient the rationale of prescribing these medications for him He is also noncompliant taking his medications that were prescribed by his cardiologist He is also not interested to quit drinking We discussed goals of care in treating his medical illness

## 2020-08-04 NOTE — Assessment & Plan Note (Signed)
Clinically, he is responding to treatment The lymph node on the left side of his neck is getting smaller We will proceed with rituximab only I plan to repeat imaging study before his next dose of treatment and I will see him back next month for further follow-up

## 2020-08-19 DIAGNOSIS — H25813 Combined forms of age-related cataract, bilateral: Secondary | ICD-10-CM | POA: Diagnosis not present

## 2020-08-19 DIAGNOSIS — H2511 Age-related nuclear cataract, right eye: Secondary | ICD-10-CM | POA: Diagnosis not present

## 2020-08-24 ENCOUNTER — Other Ambulatory Visit: Payer: Self-pay

## 2020-08-24 ENCOUNTER — Ambulatory Visit (HOSPITAL_COMMUNITY)
Admission: RE | Admit: 2020-08-24 | Discharge: 2020-08-24 | Disposition: A | Discharge status: Home / Self Care | Payer: Medicare Other | Source: Ambulatory Visit | Attending: Physician Assistant | Admitting: Physician Assistant

## 2020-08-24 ENCOUNTER — Telehealth: Payer: Self-pay | Admitting: Cardiology

## 2020-08-24 ENCOUNTER — Encounter (HOSPITAL_COMMUNITY): Payer: Self-pay | Admitting: Physician Assistant

## 2020-08-24 VITALS — BP 104/80 | HR 121 | Ht 76.0 in | Wt 316.2 lb

## 2020-08-24 DIAGNOSIS — I4819 Other persistent atrial fibrillation: Secondary | ICD-10-CM | POA: Diagnosis not present

## 2020-08-24 DIAGNOSIS — I4892 Unspecified atrial flutter: Secondary | ICD-10-CM | POA: Insufficient documentation

## 2020-08-24 DIAGNOSIS — Z6838 Body mass index (BMI) 38.0-38.9, adult: Secondary | ICD-10-CM | POA: Insufficient documentation

## 2020-08-24 DIAGNOSIS — Z87891 Personal history of nicotine dependence: Secondary | ICD-10-CM | POA: Insufficient documentation

## 2020-08-24 DIAGNOSIS — Z7901 Long term (current) use of anticoagulants: Secondary | ICD-10-CM | POA: Insufficient documentation

## 2020-08-24 DIAGNOSIS — I2510 Atherosclerotic heart disease of native coronary artery without angina pectoris: Secondary | ICD-10-CM | POA: Insufficient documentation

## 2020-08-24 DIAGNOSIS — D6869 Other thrombophilia: Secondary | ICD-10-CM

## 2020-08-24 DIAGNOSIS — E669 Obesity, unspecified: Secondary | ICD-10-CM | POA: Insufficient documentation

## 2020-08-24 DIAGNOSIS — I10 Essential (primary) hypertension: Secondary | ICD-10-CM | POA: Diagnosis not present

## 2020-08-24 LAB — CBC
HCT: 41.3 % (ref 39.0–52.0)
Hemoglobin: 13.9 g/dL (ref 13.0–17.0)
MCH: 30.8 pg (ref 26.0–34.0)
MCHC: 33.7 g/dL (ref 30.0–36.0)
MCV: 91.6 fL (ref 80.0–100.0)
Platelets: 155 10*3/uL (ref 150–400)
RBC: 4.51 MIL/uL (ref 4.22–5.81)
RDW: 14.4 % (ref 11.5–15.5)
WBC: 8.4 10*3/uL (ref 4.0–10.5)
nRBC: 0 % (ref 0.0–0.2)

## 2020-08-24 LAB — BASIC METABOLIC PANEL
Anion gap: 10 (ref 5–15)
BUN: 25 mg/dL — ABNORMAL HIGH (ref 8–23)
CO2: 20 mmol/L — ABNORMAL LOW (ref 22–32)
Calcium: 8.8 mg/dL — ABNORMAL LOW (ref 8.9–10.3)
Chloride: 105 mmol/L (ref 98–111)
Creatinine, Ser: 2.45 mg/dL — ABNORMAL HIGH (ref 0.61–1.24)
GFR, Estimated: 26 mL/min — ABNORMAL LOW (ref >60)
Glucose, Bld: 229 mg/dL — ABNORMAL HIGH (ref 70–99)
Potassium: 4.6 mmol/L (ref 3.5–5.1)
Sodium: 135 mmol/L (ref 135–145)

## 2020-08-24 NOTE — H&P (View-Only) (Signed)
Primary Care Physician: Wenda Low, MD Primary Cardiologist: Dr Marlou Porch Primary Electrophysiologist: none Referring Physician: Dr Briscoe Burns is a 79 y.o. male with a history of DM, HTN, CAD, CLL, HLD, atrial flutter, and atrial fibrillation who presents for consultation in the [facility]. The patient was initially diagnosed with atrial flutter 2019 and underwent TEE/DCCV on 09/05/18. He was in afib on follow up and was started on amiodarone and had repeat DCCV on 10/2018. He has done well since that time until about 3 days ago when he started having symptoms of dizziness, SOB, and fatigue. His smart watch showed elevated heart rates. There were no specific triggers that he could identify. He has a CHADS2VASC score of 5 but unfortunately has only been taking Eliquis 5 mg once daily. He has Xarelto that he was supposed to have started. He will take his first dose tonight. He denies any significant snoring but does drink 4-5 alcoholic beverages daily.   Today, he denies symptoms of palpitations, chest pain, orthopnea, PND, lower extremity edema, presyncope, syncope, snoring, daytime somnolence, bleeding, or neurologic sequela. The patient is tolerating medications without difficulties and is otherwise without complaint today.    Atrial Fibrillation Risk Factors:  he does not have symptoms or diagnosis of sleep apnea. he does not have a history of rheumatic fever. he does have a history of alcohol use. The patient does not have a history of early familial atrial fibrillation or other arrhythmias.  he has a BMI of Body mass index is 38.49 kg/m.Marland Kitchen Filed Weights   08/24/20 1336  Weight: (!) 143.4 kg    Family History  Problem Relation Age of Onset  . Heart attack Mother   . Cancer Father        liver     Atrial Fibrillation Management history:  Previous antiarrhythmic drugs: amiodarone  Previous cardioversions: 08/2018, 10/2018 Previous  ablations: none CHADS2VASC score: 5 Anticoagulation history: Eliquis, Xarelto    Past Medical History:  Diagnosis Date  . Blood dyscrasia    cll remission  . CLL (chronic lymphocytic leukemia) ([place]) 09/10/2013  . Diabetes mellitus, type II ([place])   . Dilated cardiomyopathy ([place]) 09/14/2018   AFlutter >> EF 40-45 pre DCCV and 45-50 post DCCV in 08/2018 // probable tachycardia mediated.  Marland Kitchen GERD (gastroesophageal reflux disease)    occ tums  . H/O cardiac catheterization   . H/O exercise stress test 1999, 2002  . Hypertension   . Hypertriglyceridemia   . Lymphocytosis   . Obesity   . Pulmonary Function Test 09/2018   PFTs 09/2018:  FEV1 88% predicted; FEV1/FVC 78%; DLCO cor 58   Past Surgical History:  Procedure Laterality Date  . CARDIOVERSION N/A 09/05/2018   Procedure: CARDIOVERSION;  Surgeon: Jerline Pain, MD;  Location: [facility] ENDOSCOPY;  Service: Cardiovascular;  Laterality: N/A;  . CARDIOVERSION N/A 10/22/2018   Procedure: CARDIOVERSION;  Surgeon: Jerline Pain, MD;  Location[facility] ENDOSCOPY;  Service: Cardiovascular;  Laterality: N/A;  . CHOLECYSTECTOMY N/A 07/12/2016   Procedure: LAPAROSCOPIC CHOLECYSTECTOMY;  Surgeon: Donnie Mesa, MD;  Location: [facility];  Service: General;  Laterality: N/A;  . EXTRACORPOREAL SHOCK WAVE LITHOTRIPSY Right 11/18/2019   Procedure: EXTRACORPOREAL SHOCK WAVE LITHOTRIPSY (ESWL);  Surgeon: Lucas Mallow, MD;  Location[facility];  Service: Urology;  Laterality: Right;  . FOOT SURGERY Right   . NASAL SINUS SURGERY    . TEE WITHOUT CARDIOVERSION N/A 09/05/2018   Procedure: TRANSESOPHAGEAL  ECHOCARDIOGRAM (TEE);  Surgeon: Jerline Pain, MD;  Location: [facility] ENDOSCOPY;  Service: Cardiovascular;  Laterality: N/A;  . TONSILLECTOMY    . UMBILICAL HERNIA REPAIR N/A 07/12/2016   Procedure: [place];  Surgeon: Donnie Mesa, MD;  Location: [place];  Service: General;  Laterality: N/A;  . vericose vein stripping      Current  Outpatient Medications  Medication Sig Dispense Refill  . acyclovir (ZOVIRAX) 400 MG tablet Take 1 tablet (400 mg total) by mouth daily. 30 tablet 3  . amiodarone (PACERONE) 200 MG tablet Take 1 tablet (200 mg total) by mouth daily. 90 tablet 3  . hydrOXYzine (ATARAX/VISTARIL) 25 MG tablet Take 25 mg by mouth at bedtime.    Marland Kitchen JARDIANCE 10 MG TABS tablet TAKE 1 TABLET BY MOUTH EVERY DAY 90 mg 3  . levothyroxine (SYNTHROID) 50 MCG tablet Take 50 mcg by mouth daily before breakfast.     . metoprolol succinate (TOPROL XL) 25 MG 24 hr tablet Take 0.5 tablets (12.5 mg total) by mouth daily. 45 tablet 3  . Omega-3 Fatty Acids (FISH OIL) 1200 MG CAPS Take 2,400 mg by mouth daily.     . ondansetron (ZOFRAN) 8 MG tablet Take 1 tablet (8 mg total) by mouth every 8 (eight) hours as needed for refractory nausea / vomiting. Start on day 2 after bendamustine chemo. 30 tablet 1  . prochlorperazine (COMPAZINE) 10 MG tablet Take 1 tablet (10 mg total) by mouth every 6 (six) hours as needed (Nausea or vomiting). 30 tablet 1  . rivaroxaban (XARELTO) 20 MG TABS tablet Take 1 tablet (20 mg total) by mouth daily with supper. 30 tablet 6  . rosuvastatin (CRESTOR) 10 MG tablet Take 10 mg by mouth daily.    . tamsulosin (FLOMAX) 0.4 MG CAPS capsule Take 0.4 mg by mouth daily.     No current facility-administered medications for this encounter.    Allergies  Allergen Reactions  . Lipitor [Atorvastatin] Rash    Social History   Socioeconomic History  . Marital status: Married    Spouse name: Not on file  . Number of children: 3  . Years of education: Not on file  . Highest education level: Not on file  Occupational History    Employer: [employer]    Comment: retied sale; asphalt work   Tobacco Use  . Smoking status: Former Smoker    Packs/day: 3.00    Years: 10.00    Pack years: 30.00    Quit date: 09/19/1988    Years since quitting: 31.9  . Smokeless tobacco: Never Used  Vaping Use  . Vaping Use:  Never used  Substance and Sexual Activity  . Alcohol use: Yes    Alcohol/week: 17.0 - 19.0 standard drinks    Types: 14 Shots of liquor, 3 - 5 Standard drinks or equivalent per week  . Drug use: No  . Sexual activity: Not on file  Other Topics Concern  . Not on file  Social History Narrative  . Not on file   Social Determinants of Health   Financial Resource Strain:   . Difficulty of Paying Living Expenses: Not on file  Food Insecurity:   . Worried About Charity fundraiser in the Last Year: Not on file  . Ran Out of Food in the Last Year: Not on file  Transportation Needs:   . Lack of Transportation (Medical): Not on file  . Lack of Transportation (Non-Medical): Not on file  Physical Activity:   .  Days of Exercise per Week: Not on file  . Minutes of Exercise per Session: Not on file  Stress:   . Feeling of Stress : Not on file  Social Connections:   . Frequency of Communication with Friends and Family: Not on file  . Frequency of Social Gatherings with Friends and Family: Not on file  . Attends Religious Services: Not on file  . Active Member of Clubs or Organizations: Not on file  . Attends Archivist Meetings: Not on file  . Marital Status: Not on file  Intimate Partner Violence:   . Fear of Current or Ex-Partner: Not on file  . Emotionally Abused: Not on file  . Physically Abused: Not on file  . Sexually Abused: Not on file     ROS- All systems are reviewed and negative except as per the HPI above.  Physical Exam: Vitals:   08/24/20 1336  BP: 104/80  Pulse: (!) 121  Weight: (!) 143.4 kg  Height: 6\' 4"  (1.93 m)    GEN- The patient is well appearing obese elderly male, alert and oriented x 3 today.   Head- normocephalic, atraumatic Eyes-  Sclera clear, conjunctiva pink Ears- hearing intact Oropharynx- clear Neck- supple  Lungs- Clear to ausculation bilaterally, normal work of breathing Heart- irregular rate and rhythm, no murmurs, rubs or  gallops  GI- soft, NT, ND, + BS Extremities- no clubbing, cyanosis, or edema MS- no significant deformity or atrophy Skin- no rash or lesion Psych- euthymic mood, full affect Neuro- strength and sensation are intact  Wt Readings from Last 3 Encounters:  08/24/20 (!) 143.4 kg  08/04/20 (!) 143.3 kg  07/21/20 (!) 146.5 kg    EKG today demonstrates atypical atrial flutter HR 121, LAFB, QRS 114, QTc 462  TEE 09/05/18 demonstrated  - Left ventricle: The cavity size was normal. Wall thickness was  normal. Systolic function was mildly to moderately reduced. The  estimated ejection fraction was in the range of 40% to 45%.  Hypokinesis of the anteroseptal myocardium.  - Aortic valve: No evidence of vegetation.  - Mitral valve: No evidence of vegetation. There was mild  regurgitation.  - Left atrium: The atrium was dilated. No evidence of thrombus in  the appendage. No evidence of thrombus in the atrial cavity or  appendage.  - Right atrium: No evidence of thrombus in the atrial cavity or  appendage.  - Atrial septum: There was increased thickness of the septum,  consistent with lipomatous hypertrophy. No defect or patent  foramen ovale was identified.  - Tricuspid valve: No evidence of vegetation.  - Pulmonic valve: No evidence of vegetation.  - Superior vena cava: The study excluded a thrombus.   Impressions:   - Post cardioversion, EF appeared improved 45-50%   Epic records are reviewed at length today  CHA2DS2-VASc Score = 5  The patient's score is based upon: CHF History: 0 HTN History: 1 Diabetes History: 1 Stroke History: 0 Vascular Disease History: 1 Age Score: 2 Gender Score: 0      ASSESSMENT AND PLAN: 1. Persistent Atrial Fibrillation/atrial flutter The patient's CHA2DS2-VASc score is 5, indicating a 7.2% annual risk of stroke.   Patient in rapid atrial flutter today. Unfortunately, patient has missed several doses of Eliquis. Will start  Xarelto 20 mg daily tonight. Stressed importance of compliance with anticoagulation.  Will arrange TEE/DCCV. Check bmet/CBC today. Continue amiodarone 200 mg daily (wife confirmed he has been taking) Continue Toprol 25 mg daily (increased from 12.5 mg,  see phone note 12/6)  2. Secondary Hypercoagulable State (ICD10:  D68.69) The patient is at significant risk for stroke/thromboembolism based upon his CHA2DS2-VASc Score of 5.  Start Rivaroxaban (Xarelto).   3. Obesity Body mass index is 38.49 kg/m. Lifestyle modification was discussed at length including regular exercise and weight reduction.  4. CAD No anginal symptoms.  5. HTN Borderline low, no room to titrate BB.   Follow up in the AF clinic one week post DCCV.    [facility] [address] [place], [place] [address] [phone] 08/24/2020 3:51 PM

## 2020-08-24 NOTE — Telephone Encounter (Signed)
Lets see if he can get in with the atrial fibrillation clinic.  Thanks. [name], MD

## 2020-08-24 NOTE — Telephone Encounter (Signed)
Spoke with patient who is agreeable to being seen in the [facility] today.  Appt scheduled at 1:30 pm.  Directions and gate code given to patient who states understanding.  Advised to take insurance cards and medication list with with to the appt.  AFIB CLINIC INFORMATION: Your appointment is scheduled on: 08/24/20 at 1:30 pm The AFib Clinic is located in the [facility] at [facility] Parking instructions/directions: [address] (off [address]). When you pull in to Entrance C, there is an underground parking garage to your right. The code to enter the garage is 3007. Take the elevators to the first floor. Follow the signs to the Heart and Vascular Specialty Clinics. You will see registration at the end of the hallway.  Phone number: [phone]

## 2020-08-24 NOTE — Telephone Encounter (Signed)
Called patient due to no DPR on file for Derek [name] Patient states symptoms began 2 days ago with slight chest discomfort, fatigue, and feeling like he is "giving out of breath". States he does not have any energy to do anyting. Reports pulse is ranging from 115-125 bpm, BP 130/90 mmHg, and O2 sat 95-98%. Denies recent sick symptoms including fever, chills, cough, malaise, or other concerns Denies recent change in caffeine or alcohol consumption Reviewed his medications and he does not have amiodarone at home; does not know the last time he took it. States he continues to take Eliquis but did not take one last night. I asked if he has picked up Xarelto and he denies.  Has not taken morning meds yet; advised him to take metoprolol succinate 25 mg this morning, an increase from 12.5 mg and to take his Eliquis. I advised that I will forward message to Dr. Marlou Porch and his nurse for additional advice. Patient verbalized understanding and agreement and thanked me for the call.

## 2020-08-24 NOTE — Progress Notes (Signed)
Primary Care Physician: Wenda Low, MD Primary Cardiologist: Dr Marlou Porch Primary Electrophysiologist: none Referring Physician: Dr Briscoe Burns is a 79 y.o. male with a history of DM, HTN, CAD, CLL, HLD, atrial flutter, and atrial fibrillation who presents for consultation in the [facility]. The patient was initially diagnosed with atrial flutter 2019 and underwent TEE/DCCV on 09/05/18. He was in afib on follow up and was started on amiodarone and had repeat DCCV on 10/2018. He has done well since that time until about 3 days ago when he started having symptoms of dizziness, SOB, and fatigue. His smart watch showed elevated heart rates. There were no specific triggers that he could identify. He has a CHADS2VASC score of 5 but unfortunately has only been taking Eliquis 5 mg once daily. He has Xarelto that he was supposed to have started. He will take his first dose tonight. He denies any significant snoring but does drink 4-5 alcoholic beverages daily.   Today, he denies symptoms of palpitations, chest pain, orthopnea, PND, lower extremity edema, presyncope, syncope, snoring, daytime somnolence, bleeding, or neurologic sequela. The patient is tolerating medications without difficulties and is otherwise without complaint today.    Atrial Fibrillation Risk Factors:  he does not have symptoms or diagnosis of sleep apnea. he does not have a history of rheumatic fever. he does have a history of alcohol use. The patient does not have a history of early familial atrial fibrillation or other arrhythmias.  he has a BMI of Body mass index is 38.49 kg/m.Marland Kitchen Filed Weights   08/24/20 1336  Weight: (!) 143.4 kg    Family History  Problem Relation Age of Onset  . Heart attack Mother   . Cancer Father        liver     Atrial Fibrillation Management history:  Previous antiarrhythmic drugs: amiodarone  Previous cardioversions: 08/2018, 10/2018 Previous  ablations: none CHADS2VASC score: 5 Anticoagulation history: Eliquis, Xarelto    Past Medical History:  Diagnosis Date  . Blood dyscrasia    cll remission  . 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 Diabetes mellitus, type II ([place])   . Dilated cardiomyopathy ([place]) 09/14/2018   AFlutter >> EF 40-45 pre DCCV and 45-50 post DCCV in 08/2018 // probable tachycardia mediated.  Marland Kitchen GERD (gastroesophageal reflux disease)    occ tums  . H/O cardiac catheterization   . H/O exercise stress test 1999, 2002  . Hypertension   . Hypertriglyceridemia   . Lymphocytosis   . Obesity   . Pulmonary Function Test 09/2018   PFTs 09/2018:  FEV1 88% predicted; FEV1/FVC 78%; DLCO cor 58   Past Surgical History:  Procedure Laterality Date  . CARDIOVERSION N/A 09/05/2018   Procedure: CARDIOVERSION;  Surgeon: Jerline Pain, MD;  Location: [facility] ENDOSCOPY;  Service: Cardiovascular;  Laterality: N/A;  . CARDIOVERSION N/A 10/22/2018   Procedure: CARDIOVERSION;  Surgeon: Jerline Pain, MD;  Location: [facility] ENDOSCOPY;  Service: Cardiovascular;  Laterality: N/A;  . CHOLECYSTECTOMY N/A 07/12/2016   Procedure: LAPAROSCOPIC CHOLECYSTECTOMY;  Surgeon: Donnie Mesa, MD;  Location: [facility];  Service: General;  Laterality: N/A;  . EXTRACORPOREAL SHOCK WAVE LITHOTRIPSY Right 11/18/2019   Procedure: EXTRACORPOREAL SHOCK WAVE LITHOTRIPSY (ESWL);  Surgeon: Lucas Mallow, MD;  Location: [facility];  Service: Urology;  Laterality: Right;  . FOOT SURGERY Right   . NASAL SINUS SURGERY    . TEE WITHOUT CARDIOVERSION N/A 09/05/2018   Procedure: TRANSESOPHAGEAL  ECHOCARDIOGRAM (TEE);  Surgeon: Jerline Pain, MD;  Location: [facility] ENDOSCOPY;  Service: Cardiovascular;  Laterality: N/A;  . TONSILLECTOMY    . UMBILICAL HERNIA REPAIR N/A 07/12/2016   Procedure: [place];  Surgeon: Donnie Mesa, MD;  Location: [place];  Service: General;  Laterality: N/A;  . vericose vein stripping      Current  Outpatient Medications  Medication Sig Dispense Refill  . acyclovir (ZOVIRAX) 400 MG tablet Take 1 tablet (400 mg total) by mouth daily. 30 tablet 3  . amiodarone (PACERONE) 200 MG tablet Take 1 tablet (200 mg total) by mouth daily. 90 tablet 3  . hydrOXYzine (ATARAX/VISTARIL) 25 MG tablet Take 25 mg by mouth at bedtime.    Marland Kitchen JARDIANCE 10 MG TABS tablet TAKE 1 TABLET BY MOUTH EVERY DAY 90 mg 3  . levothyroxine (SYNTHROID) 50 MCG tablet Take 50 mcg by mouth daily before breakfast.     . metoprolol succinate (TOPROL XL) 25 MG 24 hr tablet Take 0.5 tablets (12.5 mg total) by mouth daily. 45 tablet 3  . Omega-3 Fatty Acids (FISH OIL) 1200 MG CAPS Take 2,400 mg by mouth daily.     . ondansetron (ZOFRAN) 8 MG tablet Take 1 tablet (8 mg total) by mouth every 8 (eight) hours as needed for refractory nausea / vomiting. Start on day 2 after bendamustine chemo. 30 tablet 1  . prochlorperazine (COMPAZINE) 10 MG tablet Take 1 tablet (10 mg total) by mouth every 6 (six) hours as needed (Nausea or vomiting). 30 tablet 1  . rivaroxaban (XARELTO) 20 MG TABS tablet Take 1 tablet (20 mg total) by mouth daily with supper. 30 tablet 6  . rosuvastatin (CRESTOR) 10 MG tablet Take 10 mg by mouth daily.    . tamsulosin (FLOMAX) 0.4 MG CAPS capsule Take 0.4 mg by mouth daily.     No current facility-administered medications for this encounter.    Allergies  Allergen Reactions  . Lipitor [Atorvastatin] Rash    Social History   Socioeconomic History  . Marital status: Married    Spouse name: Not on file  . Number of children: 3  . Years of education: Not on file  . Highest education level: Not on file  Occupational History    Employer: [employer]    Comment: retied sale; asphalt work   Tobacco Use  . Smoking status: Former Smoker    Packs/day: 3.00    Years: 10.00    Pack years: 30.00    Quit date: 09/19/1988    Years since quitting: 31.9  . Smokeless tobacco: Never Used  Vaping Use  . Vaping Use:  Never used  Substance and Sexual Activity  . Alcohol use: Yes    Alcohol/week: 17.0 - 19.0 standard drinks    Types: 14 Shots of liquor, 3 - 5 Standard drinks or equivalent per week  . Drug use: No  . Sexual activity: Not on file  Other Topics Concern  . Not on file  Social History Narrative  . Not on file   Social Determinants of Health   Financial Resource Strain:   . Difficulty of Paying Living Expenses: Not on file  Food Insecurity:   . Worried About Charity fundraiser in the Last Year: Not on file  . Ran Out of Food in the Last Year: Not on file  Transportation Needs:   . Lack of Transportation (Medical): Not on file  . Lack of Transportation (Non-Medical): Not on file  Physical Activity:   .  Days of Exercise per Week: Not on file  . Minutes of Exercise per Session: Not on file  Stress:   . Feeling of Stress : Not on file  Social Connections:   . Frequency of Communication with Friends and Family: Not on file  . Frequency of Social Gatherings with Friends and Family: Not on file  . Attends Religious Services: Not on file  . Active Member of Clubs or Organizations: Not on file  . Attends Archivist Meetings: Not on file  . Marital Status: Not on file  Intimate Partner Violence:   . Fear of Current or Ex-Partner: Not on file  . Emotionally Abused: Not on file  . Physically Abused: Not on file  . Sexually Abused: Not on file     ROS- All systems are reviewed and negative except as per the HPI above.  Physical Exam: Vitals:   08/24/20 1336  BP: 104/80  Pulse: (!) 121  Weight: (!) 143.4 kg  Height: 6\' 4"  (1.93 m)    GEN- The patient is well appearing obese elderly male, alert and oriented x 3 today.   Head- normocephalic, atraumatic Eyes-  Sclera clear, conjunctiva pink Ears- hearing intact Oropharynx- clear Neck- supple  Lungs- Clear to ausculation bilaterally, normal work of breathing Heart- irregular rate and rhythm, no murmurs, rubs or  gallops  GI- soft, NT, ND, + BS Extremities- no clubbing, cyanosis, or edema MS- no significant deformity or atrophy Skin- no rash or lesion Psych- euthymic mood, full affect Neuro- strength and sensation are intact  Wt Readings from Last 3 Encounters:  08/24/20 (!) 143.4 kg  08/04/20 (!) 143.3 kg  07/21/20 (!) 146.5 kg    EKG today demonstrates atypical atrial flutter HR 121, LAFB, QRS 114, QTc 462  TEE 09/05/18 demonstrated  - Left ventricle: The cavity size was normal. Wall thickness was  normal. Systolic function was mildly to moderately reduced. The  estimated ejection fraction was in the range of 40% to 45%.  Hypokinesis of the anteroseptal myocardium.  - Aortic valve: No evidence of vegetation.  - Mitral valve: No evidence of vegetation. There was mild  regurgitation.  - Left atrium: The atrium was dilated. No evidence of thrombus in  the appendage. No evidence of thrombus in the atrial cavity or  appendage.  - Right atrium: No evidence of thrombus in the atrial cavity or  appendage.  - Atrial septum: There was increased thickness of the septum,  consistent with lipomatous hypertrophy. No defect or patent  foramen ovale was identified.  - Tricuspid valve: No evidence of vegetation.  - Pulmonic valve: No evidence of vegetation.  - Superior vena cava: The study excluded a thrombus.   Impressions:   - Post cardioversion, EF appeared improved 45-50%   Epic records are reviewed at length today  CHA2DS2-VASc Score = 5  The patient's score is based upon: CHF History: 0 HTN History: 1 Diabetes History: 1 Stroke History: 0 Vascular Disease History: 1 Age Score: 2 Gender Score: 0      ASSESSMENT AND PLAN: 1. Persistent Atrial Fibrillation/atrial flutter The patient's CHA2DS2-VASc score is 5, indicating a 7.2% annual risk of stroke.   Patient in rapid atrial flutter today. Unfortunately, patient has missed several doses of Eliquis. Will start  Xarelto 20 mg daily tonight. Stressed importance of compliance with anticoagulation.  Will arrange TEE/DCCV. Check bmet/CBC today. Continue amiodarone 200 mg daily (wife confirmed he has been taking) Continue Toprol 25 mg daily (increased from 12.5 mg,  see phone note 12/6)  2. Secondary Hypercoagulable State (ICD10:  D68.69) The patient is at significant risk for stroke/thromboembolism based upon his CHA2DS2-VASc Score of 5.  Start Rivaroxaban (Xarelto).   3. Obesity Body mass index is 38.49 kg/m. Lifestyle modification was discussed at length including regular exercise and weight reduction.  4. CAD No anginal symptoms.  5. HTN Borderline low, no room to titrate BB.   Follow up in the AF clinic one week post DCCV.    [facility] [address] [place], [place] [address] [phone] 08/24/2020 3:51 PM

## 2020-08-24 NOTE — Patient Instructions (Addendum)
Cardioversion scheduled for Friday, December 10th  - Arrive at the [facility] and go to admitting at 1030AM  - Do not eat or drink anything after midnight the night prior to your procedure.  - Take all your morning medication (except diabetic medications) with a sip of water prior to arrival.  - You will not be able to drive home after your procedure.  - Do NOT miss any doses of your blood thinner - if you should miss a dose please notify our office immediately.  - If you feel as if you go back into normal rhythm prior to scheduled cardioversion, please notify our office immediately. If your procedure is canceled in the cardioversion suite you will be charged a cancellation fee.  Start Xarelto 20mg  once a day with supper

## 2020-08-24 NOTE — Telephone Encounter (Signed)
Patient c/o Palpitations:  High priority if patient c/o lightheadedness, shortness of breath, or chest pain  1) How long have you had palpitations/irregular HR/ Afib? Are you having the symptoms now? Last 2 or 3 days, yes  2) Are you currently experiencing lightheadedness, SOB or CP? Lightheadedness for the last several days, not currently with patient  3) Do you have a history of afib (atrial fibrillation) or irregular heart rhythm? yes  4) Have you checked your BP or HR? (document readings if available): last night 130/94 HR 126  5) Are you experiencing any other symptoms? No   Patient's close friend Mateo Flow states she believes the patient is back in afib. She states he has felt terrible for the last 2 or 3 days. She states he has also been lightheaded. She states she is not currently with the patient. She states his oxygen went down to 93% last night.

## 2020-08-26 ENCOUNTER — Other Ambulatory Visit (HOSPITAL_COMMUNITY)
Admission: RE | Admit: 2020-08-26 | Discharge: 2020-08-26 | Disposition: A | Discharge status: Home / Self Care | Payer: Medicare Other | Source: Ambulatory Visit | Attending: Cardiovascular Disease | Admitting: Cardiovascular Disease

## 2020-08-26 DIAGNOSIS — Z20822 Contact with and (suspected) exposure to covid-19: Secondary | ICD-10-CM | POA: Diagnosis not present

## 2020-08-26 DIAGNOSIS — H2512 Age-related nuclear cataract, left eye: Secondary | ICD-10-CM | POA: Diagnosis not present

## 2020-08-26 DIAGNOSIS — H25012 Cortical age-related cataract, left eye: Secondary | ICD-10-CM | POA: Diagnosis not present

## 2020-08-26 DIAGNOSIS — Z01812 Encounter for preprocedural laboratory examination: Secondary | ICD-10-CM | POA: Diagnosis not present

## 2020-08-26 LAB — SARS CORONAVIRUS 2 (TAT 6-24 HRS): SARS Coronavirus 2: NEGATIVE

## 2020-08-28 ENCOUNTER — Telehealth: Payer: Self-pay | Admitting: Cardiology

## 2020-08-28 ENCOUNTER — Other Ambulatory Visit: Payer: Self-pay

## 2020-08-28 ENCOUNTER — Encounter (HOSPITAL_COMMUNITY): Payer: Self-pay | Admitting: Cardiovascular Disease

## 2020-08-28 ENCOUNTER — Ambulatory Visit (HOSPITAL_COMMUNITY)
Admission: RE | Admit: 2020-08-28 | Discharge: 2020-08-28 | Disposition: A | Discharge status: Home / Self Care | Payer: Medicare Other | Attending: Cardiovascular Disease | Admitting: Cardiovascular Disease

## 2020-08-28 ENCOUNTER — Ambulatory Visit (HOSPITAL_COMMUNITY): Payer: Medicare Other | Admitting: Certified Registered"

## 2020-08-28 ENCOUNTER — Ambulatory Visit (HOSPITAL_BASED_OUTPATIENT_CLINIC_OR_DEPARTMENT_OTHER): Payer: Medicare Other

## 2020-08-28 ENCOUNTER — Encounter (HOSPITAL_COMMUNITY)
Admission: RE | Disposition: A | Discharge status: Home / Self Care | Payer: Self-pay | Source: Home / Self Care | Attending: Cardiovascular Disease

## 2020-08-28 DIAGNOSIS — Z79890 Hormone replacement therapy (postmenopausal): Secondary | ICD-10-CM | POA: Diagnosis not present

## 2020-08-28 DIAGNOSIS — Z87891 Personal history of nicotine dependence: Secondary | ICD-10-CM | POA: Insufficient documentation

## 2020-08-28 DIAGNOSIS — I4819 Other persistent atrial fibrillation: Secondary | ICD-10-CM | POA: Diagnosis not present

## 2020-08-28 DIAGNOSIS — D6869 Other thrombophilia: Secondary | ICD-10-CM | POA: Diagnosis not present

## 2020-08-28 DIAGNOSIS — E785 Hyperlipidemia, unspecified: Secondary | ICD-10-CM | POA: Diagnosis not present

## 2020-08-28 DIAGNOSIS — Z7901 Long term (current) use of anticoagulants: Secondary | ICD-10-CM | POA: Diagnosis not present

## 2020-08-28 DIAGNOSIS — Z79899 Other long term (current) drug therapy: Secondary | ICD-10-CM | POA: Diagnosis not present

## 2020-08-28 DIAGNOSIS — I2510 Atherosclerotic heart disease of native coronary artery without angina pectoris: Secondary | ICD-10-CM | POA: Insufficient documentation

## 2020-08-28 DIAGNOSIS — Z8249 Family history of ischemic heart disease and other diseases of the circulatory system: Secondary | ICD-10-CM | POA: Insufficient documentation

## 2020-08-28 DIAGNOSIS — I10 Essential (primary) hypertension: Secondary | ICD-10-CM | POA: Diagnosis not present

## 2020-08-28 DIAGNOSIS — C9111 Chronic lymphocytic leukemia of B-cell type in remission: Secondary | ICD-10-CM | POA: Insufficient documentation

## 2020-08-28 DIAGNOSIS — I340 Nonrheumatic mitral (valve) insufficiency: Secondary | ICD-10-CM

## 2020-08-28 DIAGNOSIS — I4892 Unspecified atrial flutter: Secondary | ICD-10-CM

## 2020-08-28 DIAGNOSIS — Z888 Allergy status to other drugs, medicaments and biological substances status: Secondary | ICD-10-CM | POA: Diagnosis not present

## 2020-08-28 DIAGNOSIS — I484 Atypical atrial flutter: Secondary | ICD-10-CM | POA: Diagnosis not present

## 2020-08-28 DIAGNOSIS — Z6838 Body mass index (BMI) 38.0-38.9, adult: Secondary | ICD-10-CM | POA: Insufficient documentation

## 2020-08-28 SURGERY — CARDIOVERSION
Anesthesia: General

## 2020-08-28 MED ORDER — SODIUM CHLORIDE 0.9 % IV SOLN: INTRAVENOUS | Status: DC

## 2020-08-28 MED ORDER — FENTANYL CITRATE (PF) 100 MCG/2ML IJ SOLN
INTRAMUSCULAR | Status: DC | PRN
  Administered 2020-08-28: 25 ug via INTRAVENOUS

## 2020-08-28 MED ORDER — PROPOFOL 500 MG/50ML IV EMUL
INTRAVENOUS | Status: DC | PRN
  Administered 2020-08-28: 200 ug/kg/min via INTRAVENOUS

## 2020-08-28 NOTE — Anesthesia Procedure Notes (Signed)
Procedure Name: MAC Date/Time: 08/28/2020 10:20 AM Performed by: Imagene Riches, CRNA Pre-anesthesia Checklist: Patient identified, Emergency Drugs available, Suction available, Patient being monitored and Timeout performed Patient Re-evaluated:Patient Re-evaluated prior to induction Oxygen Delivery Method: Nasal cannula

## 2020-08-28 NOTE — Interval H&P Note (Signed)
History and Physical Interval Note:  08/28/2020 9:32 AM  Derek [name]  has presented today for surgery, with the diagnosis of AFIB.  The various methods of treatment have been discussed with the patient and family. After consideration of risks, benefits and other options for treatment, the patient has consented to  Procedure(s): CARDIOVERSION (N/A) TRANSESOPHAGEAL ECHOCARDIOGRAM (TEE) (N/A) as a surgical intervention.  The patient's history has been reviewed, patient examined, no change in status, stable for surgery.  I have reviewed the patient's chart and labs.  Questions were answered to the patient's satisfaction.     [name] Rouge

## 2020-08-28 NOTE — Anesthesia Preprocedure Evaluation (Addendum)
Anesthesia Evaluation  Patient identified by MRN, date of birth, ID band Patient awake    Reviewed: Allergy & Precautions, NPO status , Patient's Chart, lab work & pertinent test results  Airway Mallampati: III  TM Distance: >3 FB Neck ROM: Full    Dental  (+) Edentulous Upper, Edentulous Lower   Pulmonary former smoker,    Pulmonary exam normal breath sounds clear to auscultation       Cardiovascular hypertension, + CAD and +CHF  Normal cardiovascular exam+ dysrhythmias Atrial Fibrillation  Rhythm:Regular Rate:Tachycardia  ECG: ST, rate 121. LAFB   Neuro/Psych negative neurological ROS  negative psych ROS   GI/Hepatic Neg liver ROS, GERD  ,  Endo/Other  diabetesHypothyroidism CLL (chronic lymphocytic leukemia  Renal/GU CRFRenal disease     Musculoskeletal negative musculoskeletal ROS (+)   Abdominal (+) + obese,   Peds  Hematology HLD   Anesthesia Other Findings A-FIB WITH RVR  Reproductive/Obstetrics                            Anesthesia Physical Anesthesia Plan  ASA: IV  Anesthesia Plan: General   Post-op Pain Management:    Induction: Intravenous  PONV Risk Score and Plan: 2 and Propofol infusion and Treatment may vary due to age or medical condition  Airway Management Planned: Mask  Additional Equipment:   Intra-op Plan:   Post-operative Plan:   Informed Consent: I have reviewed the patients History and Physical, chart, labs and discussed the procedure including the risks, benefits and alternatives for the proposed anesthesia with the patient or authorized representative who has indicated his/her understanding and acceptance.       Plan Discussed with: CRNA  Anesthesia Plan Comments:        Anesthesia Quick Evaluation

## 2020-08-28 NOTE — Transfer of Care (Signed)
Immediate Anesthesia Transfer of Care Note  Patient: Derek [name]  Procedure(s) Performed: CARDIOVERSION (N/A ) TRANSESOPHAGEAL ECHOCARDIOGRAM (TEE) (N/A )  Patient Location: Endoscopy Unit  Anesthesia Type:MAC  Level of Consciousness: awake  Airway & Oxygen Therapy: Patient Spontanous Breathing and Patient connected to nasal cannula oxygen  Post-op Assessment: Report given to RN and Post -op Vital signs reviewed and stable  Post vital signs: Reviewed and stable  Last Vitals:  Vitals Value Taken Time  BP 75/49 08/28/20 1032  Temp    Pulse 82 08/28/20 1033  Resp 19 08/28/20 1033  SpO2 96 % 08/28/20 1033  Vitals shown include unvalidated device data.  Last Pain:  Vitals:   08/28/20 0932  PainSc: 0-No pain         Complications: No complications documented.

## 2020-08-28 NOTE — CV Procedure (Signed)
TEE/DCC Anesthesia: Propofol/Fentanyl  EF 45-50% in rapid flutter rate 133 bpm Moderate MR Normal AV Normal RV No effusion  No LAA thrombus No ASD/PFO  DCC x 1 200J Converted to NSR rate 88 bpm No immediate neurologic sequelae  [name] Rouge MD [facility]

## 2020-08-28 NOTE — Telephone Encounter (Signed)
Spoke with pt and earlier today had DCCV and TEE done now pt is complaining of sore throat Per pt has tried lozenges with no relief Instructed to try and gargle with warm salt water , may try and take ES Tylenol as directed ,or let ice cubes dissolve. Pt aware this is normal but if no improvement or worsens to go to ED for eval and tx . Will forward to Dr Marlou Porch for review and recommendaitons/cy

## 2020-08-28 NOTE — Telephone Encounter (Signed)
New Message:     Pt had a procedure this morning and they put a tube down his throat. His throat is so sore, he can not swallow.

## 2020-08-28 NOTE — Discharge Instructions (Signed)
Transesophageal Echocardiogram Transesophageal echocardiogram (TEE) is a test that uses sound waves to take pictures of your heart. TEE is done by passing a flexible tube down the esophagus. The esophagus is the tube that carries food from the throat to the stomach. The pictures give detailed images of your heart. This can help your doctor see if there are problems with your heart. What happens before the procedure? Staying hydrated Follow instructions from your doctor about hydration, which may include:  Up to 3 hours before the procedure - you may continue to drink clear liquids, such as: ? Water. ? Clear fruit juice. ? Black coffee. ? Plain tea.  Eating and drinking Follow instructions from your doctor about eating and drinking, which may include:  8 hours before the procedure - stop eating heavy meals or foods such as meat, fried foods, or fatty foods.  6 hours before the procedure - stop eating light meals or foods, such as toast or cereal.  6 hours before the procedure - stop drinking milk or drinks that contain milk.  3 hours before the procedure - stop drinking clear liquids. General instructions  You will need to take out any dentures or retainers.  Plan to have someone take you home from the hospital or clinic.  If you will be going home right after the procedure, plan to have someone with you for 24 hours.  Ask your doctor about: ? Changing or stopping your normal medicines. This is important if you take diabetes medicines or blood thinners. ? Taking over-the-counter medicines, vitamins, herbs, and supplements. ? Taking medicines such as aspirin and ibuprofen. These medicines can thin your blood. Do not take these medicines unless your doctor tells you to take them. What happens during the procedure?  To lower your risk of infection, your doctors will wash or clean their hands.  An IV will be put into one of your veins.  You will be given a medicine to help you  relax (sedative).  A medicine may be sprayed or gargled. This numbs the back of your throat.  Your blood pressure, heart rate, and breathing will be watched.  You may be asked to lay on your left side.  A bite block will be placed in your mouth. This keeps you from biting the tube.  The tip of the TEE probe will be placed into the back of your mouth.  You will be asked to swallow.  Your doctor will take pictures of your heart.  The probe and bite block will be taken out. The procedure may vary among doctors and hospitals. What happens after the procedure?   Your blood pressure, heart rate, breathing rate, and blood oxygen level will be watched until the medicines you were given have worn off.  When you first wake up, your throat may feel sore and numb. This will get better over time. You will not be allowed to eat or drink until the numbness has gone away.  Do not drive for 24 hours if you were given a medicine to help you relax. Summary  TEE is a test that uses sound waves to take pictures of your heart.  You will be given a medicine to help you relax.  Do not drive for 24 hours if you were given a medicine to help you relax. This information is not intended to replace advice given to you by your health care provider. Make sure you discuss any questions you have with your health care provider. Document Revised:   05/25/2018 Document Reviewed: 12/07/2016 Elsevier Patient Education  [place].   Hospital doctor cardioversion is the delivery of a jolt of electricity to restore a normal rhythm to the heart. A rhythm that is too fast or is not regular keeps the heart from pumping well. In this procedure, sticky patches or metal paddles are placed on the chest to deliver electricity to the heart from a device. This procedure may be done in an emergency if:  There is low or no blood pressure as a result of the heart rhythm.  Normal rhythm must be  restored as fast as possible to protect the brain and heart from further damage.  It may save a life. This may also be a scheduled procedure for irregular or fast heart rhythms that are not immediately life-threatening. Tell a health care provider about:  Any allergies you have.  All medicines you are taking, including vitamins, herbs, eye drops, creams, and over-the-counter medicines.  Any problems you or family members have had with anesthetic medicines.  Any blood disorders you have.  Any surgeries you have had.  Any medical conditions you have.  Whether you are pregnant or may be pregnant. What are the risks? Generally, this is a safe procedure. However, problems may occur, including:  Allergic reactions to medicines.  A blood clot that breaks free and travels to other parts of your body.  The possible return of an abnormal heart rhythm within hours or days after the procedure.  Your heart stopping (cardiac arrest). This is rare. What happens before the procedure? Medicines  Your health care provider may have you start taking: ? Blood-thinning medicines (anticoagulants) so your blood does not clot as easily. ? Medicines to help stabilize your heart rate and rhythm.  Ask your health care provider about: ? Changing or stopping your regular medicines. This is especially important if you are taking diabetes medicines or blood thinners. ? Taking medicines such as aspirin and ibuprofen. These medicines can thin your blood. Do not take these medicines unless your health care provider tells you to take them. ? Taking over-the-counter medicines, vitamins, herbs, and supplements. General instructions  Follow instructions from your health care provider about eating or drinking restrictions.  Plan to have someone take you home from the hospital or clinic.  If you will be going home right after the procedure, plan to have someone with you for 24 hours.  Ask your health care  provider what steps will be taken to help prevent infection. These may include washing your skin with a germ-killing soap. What happens during the procedure?   An IV will be inserted into one of your veins.  Sticky patches (electrodes) or metal paddles may be placed on your chest.  You will be given a medicine to help you relax (sedative).  An electrical shock will be delivered. The procedure may vary among health care providers and hospitals. What can I expect after the procedure?  Your blood pressure, heart rate, breathing rate, and blood oxygen level will be monitored until you leave the hospital or clinic.  Your heart rhythm will be watched to make sure it does not change.  You may have some redness on the skin where the shocks were given. Follow these instructions at home:  Do not drive for 24 hours if you were given a sedative during your procedure.  Take over-the-counter and prescription medicines only as told by your health care provider.  Ask your health care provider how to check  your pulse. Check it often.  Rest for 48 hours after the procedure or as told by your health care provider.  Avoid or limit your caffeine use as told by your health care provider.  Keep all follow-up visits as told by your health care provider. This is important. Contact a health care provider if:  You feel like your heart is beating too quickly or your pulse is not regular.  You have a serious muscle cramp that does not go away. Get help right away if:  You have discomfort in your chest.  You are dizzy or you feel faint.  You have trouble breathing or you are short of breath.  Your speech is slurred.  You have trouble moving an arm or leg on one side of your body.  Your fingers or toes turn cold or blue. Summary  Electrical cardioversion is the delivery of a jolt of electricity to restore a normal rhythm to the heart.  This procedure may be done right away in an emergency or  may be a scheduled procedure if the condition is not an emergency.  Generally, this is a safe procedure.  After the procedure, check your pulse often as told by your health care provider. This information is not intended to replace advice given to you by your health care provider. Make sure you discuss any questions you have with your health care provider. Document Revised: 04/08/2019 Document Reviewed: 04/08/2019 Elsevier Patient Education  [place].

## 2020-08-28 NOTE — Progress Notes (Signed)
Echocardiogram Echocardiogram Transesophageal has been performed.  Oneal Deputy Persis Graffius 08/28/2020, 10:34 AM

## 2020-08-29 NOTE — Anesthesia Postprocedure Evaluation (Signed)
Anesthesia Post Note  Patient: Derek [name]  Procedure(s) Performed: CARDIOVERSION (N/A ) TRANSESOPHAGEAL ECHOCARDIOGRAM (TEE) (N/A )     Patient location during evaluation: Endoscopy Anesthesia Type: General Level of consciousness: awake Pain management: pain level controlled Vital Signs Assessment: post-procedure vital signs reviewed and stable Respiratory status: spontaneous breathing, nonlabored ventilation, respiratory function stable and patient connected to nasal cannula oxygen Cardiovascular status: blood pressure returned to baseline and stable Postop Assessment: no apparent nausea or vomiting Anesthetic complications: no   No complications documented.  Last Vitals:  Vitals:   08/28/20 1041 08/28/20 1052  BP: 114/70 117/65  Pulse: 80 78  Resp: (!) 21 18  Temp:    SpO2: 97% 96%    Last Pain:  Vitals:   08/28/20 1052  TempSrc:   PainSc: 0-No pain                 Daysi Boggan P Salvatore Poe

## 2020-08-30 ENCOUNTER — Encounter (HOSPITAL_COMMUNITY): Payer: Self-pay | Admitting: Cardiovascular Disease

## 2020-08-30 NOTE — Telephone Encounter (Signed)
Agree with plan.  If no improvement, let me know.   [name], MD

## 2020-08-31 ENCOUNTER — Other Ambulatory Visit: Payer: Self-pay

## 2020-08-31 ENCOUNTER — Ambulatory Visit (HOSPITAL_COMMUNITY)
Admission: RE | Admit: 2020-08-31 | Discharge: 2020-08-31 | Disposition: A | Discharge status: Home / Self Care | Payer: Medicare Other | Source: Ambulatory Visit | Attending: Hematology and Oncology | Admitting: Hematology and Oncology

## 2020-08-31 DIAGNOSIS — R590 Localized enlarged lymph nodes: Secondary | ICD-10-CM | POA: Diagnosis not present

## 2020-08-31 DIAGNOSIS — M47814 Spondylosis without myelopathy or radiculopathy, thoracic region: Secondary | ICD-10-CM | POA: Diagnosis not present

## 2020-08-31 DIAGNOSIS — I2510 Atherosclerotic heart disease of native coronary artery without angina pectoris: Secondary | ICD-10-CM | POA: Diagnosis not present

## 2020-08-31 DIAGNOSIS — N2889 Other specified disorders of kidney and ureter: Secondary | ICD-10-CM | POA: Diagnosis not present

## 2020-08-31 DIAGNOSIS — C9110 Chronic lymphocytic leukemia of B-cell type not having achieved remission: Secondary | ICD-10-CM

## 2020-08-31 DIAGNOSIS — J8410 Pulmonary fibrosis, unspecified: Secondary | ICD-10-CM | POA: Diagnosis not present

## 2020-08-31 DIAGNOSIS — K449 Diaphragmatic hernia without obstruction or gangrene: Secondary | ICD-10-CM | POA: Diagnosis not present

## 2020-08-31 DIAGNOSIS — N184 Chronic kidney disease, stage 4 (severe): Secondary | ICD-10-CM

## 2020-08-31 MED ORDER — IOHEXOL 9 MG/ML PO SOLN
ORAL | Status: AC
  Filled 2020-08-31: qty 1000

## 2020-09-01 ENCOUNTER — Inpatient Hospital Stay (HOSPITAL_BASED_OUTPATIENT_CLINIC_OR_DEPARTMENT_OTHER): Payer: Medicare Other | Admitting: Hematology and Oncology

## 2020-09-01 ENCOUNTER — Inpatient Hospital Stay: Payer: Medicare Other | Attending: Hematology and Oncology

## 2020-09-01 ENCOUNTER — Inpatient Hospital Stay: Payer: Medicare Other

## 2020-09-01 ENCOUNTER — Encounter: Payer: Self-pay | Admitting: Hematology and Oncology

## 2020-09-01 ENCOUNTER — Telehealth: Payer: Self-pay | Admitting: *Deleted

## 2020-09-01 ENCOUNTER — Other Ambulatory Visit: Payer: Self-pay

## 2020-09-01 VITALS — BP 159/64 | HR 59 | Temp 97.4°F | Resp 18

## 2020-09-01 DIAGNOSIS — Z87442 Personal history of urinary calculi: Secondary | ICD-10-CM | POA: Insufficient documentation

## 2020-09-01 DIAGNOSIS — I4892 Unspecified atrial flutter: Secondary | ICD-10-CM | POA: Insufficient documentation

## 2020-09-01 DIAGNOSIS — Z7189 Other specified counseling: Secondary | ICD-10-CM | POA: Diagnosis not present

## 2020-09-01 DIAGNOSIS — Z79899 Other long term (current) drug therapy: Secondary | ICD-10-CM | POA: Diagnosis not present

## 2020-09-01 DIAGNOSIS — K449 Diaphragmatic hernia without obstruction or gangrene: Secondary | ICD-10-CM | POA: Diagnosis not present

## 2020-09-01 DIAGNOSIS — K5730 Diverticulosis of large intestine without perforation or abscess without bleeding: Secondary | ICD-10-CM | POA: Insufficient documentation

## 2020-09-01 DIAGNOSIS — I081 Rheumatic disorders of both mitral and tricuspid valves: Secondary | ICD-10-CM | POA: Insufficient documentation

## 2020-09-01 DIAGNOSIS — M47814 Spondylosis without myelopathy or radiculopathy, thoracic region: Secondary | ICD-10-CM | POA: Diagnosis not present

## 2020-09-01 DIAGNOSIS — N184 Chronic kidney disease, stage 4 (severe): Secondary | ICD-10-CM

## 2020-09-01 DIAGNOSIS — C9110 Chronic lymphocytic leukemia of B-cell type not having achieved remission: Secondary | ICD-10-CM | POA: Diagnosis not present

## 2020-09-01 DIAGNOSIS — R590 Localized enlarged lymph nodes: Secondary | ICD-10-CM | POA: Insufficient documentation

## 2020-09-01 DIAGNOSIS — Z5112 Encounter for antineoplastic immunotherapy: Secondary | ICD-10-CM | POA: Insufficient documentation

## 2020-09-01 DIAGNOSIS — M47816 Spondylosis without myelopathy or radiculopathy, lumbar region: Secondary | ICD-10-CM | POA: Insufficient documentation

## 2020-09-01 DIAGNOSIS — D638 Anemia in other chronic diseases classified elsewhere: Secondary | ICD-10-CM

## 2020-09-01 DIAGNOSIS — D631 Anemia in chronic kidney disease: Secondary | ICD-10-CM | POA: Insufficient documentation

## 2020-09-01 DIAGNOSIS — Z7901 Long term (current) use of anticoagulants: Secondary | ICD-10-CM | POA: Insufficient documentation

## 2020-09-01 DIAGNOSIS — R911 Solitary pulmonary nodule: Secondary | ICD-10-CM

## 2020-09-01 DIAGNOSIS — Z9049 Acquired absence of other specified parts of digestive tract: Secondary | ICD-10-CM | POA: Insufficient documentation

## 2020-09-01 LAB — CBC WITH DIFFERENTIAL/PLATELET
Abs Immature Granulocytes: 0.02 10*3/uL (ref 0.00–0.07)
Basophils Absolute: 0 10*3/uL (ref 0.0–0.1)
Basophils Relative: 1 %
Eosinophils Absolute: 0.1 10*3/uL (ref 0.0–0.5)
Eosinophils Relative: 2 %
HCT: 36.2 % — ABNORMAL LOW (ref 39.0–52.0)
Hemoglobin: 12.8 g/dL — ABNORMAL LOW (ref 13.0–17.0)
Immature Granulocytes: 0 %
Lymphocytes Relative: 15 %
Lymphs Abs: 1 10*3/uL (ref 0.7–4.0)
MCH: 31.2 pg (ref 26.0–34.0)
MCHC: 35.4 g/dL (ref 30.0–36.0)
MCV: 88.3 fL (ref 80.0–100.0)
Monocytes Absolute: 0.8 10*3/uL (ref 0.1–1.0)
Monocytes Relative: 12 %
Neutro Abs: 4.5 10*3/uL (ref 1.7–7.7)
Neutrophils Relative %: 70 %
Platelets: 143 10*3/uL — ABNORMAL LOW (ref 150–400)
RBC: 4.1 MIL/uL — ABNORMAL LOW (ref 4.22–5.81)
RDW: 14.7 % (ref 11.5–15.5)
WBC: 6.4 10*3/uL (ref 4.0–10.5)
nRBC: 0 % (ref 0.0–0.2)

## 2020-09-01 LAB — COMPREHENSIVE METABOLIC PANEL
ALT: 17 U/L (ref 0–44)
AST: 15 U/L (ref 15–41)
Albumin: 3.8 g/dL (ref 3.5–5.0)
Alkaline Phosphatase: 78 U/L (ref 38–126)
Anion gap: 8 (ref 5–15)
BUN: 21 mg/dL (ref 8–23)
CO2: 22 mmol/L (ref 22–32)
Calcium: 9.2 mg/dL (ref 8.9–10.3)
Chloride: 109 mmol/L (ref 98–111)
Creatinine, Ser: 2.19 mg/dL — ABNORMAL HIGH (ref 0.61–1.24)
GFR, Estimated: 30 mL/min — ABNORMAL LOW (ref >60)
Glucose, Bld: 158 mg/dL — ABNORMAL HIGH (ref 70–99)
Potassium: 4.3 mmol/L (ref 3.5–5.1)
Sodium: 139 mmol/L (ref 135–145)
Total Bilirubin: 0.8 mg/dL (ref 0.3–1.2)
Total Protein: 6.9 g/dL (ref 6.5–8.1)

## 2020-09-01 LAB — URIC ACID: Uric Acid, Serum: 4.6 mg/dL (ref 3.7–8.6)

## 2020-09-01 MED ORDER — SODIUM CHLORIDE 0.9 % IV SOLN
Freq: Once | INTRAVENOUS | Status: AC
  Filled 2020-09-01: qty 250

## 2020-09-01 MED ORDER — SODIUM CHLORIDE 0.9 % IV SOLN
375.0000 mg/m2 | Freq: Once | INTRAVENOUS | Status: AC
  Administered 2020-09-01: 1000 mg via INTRAVENOUS
  Filled 2020-09-01: qty 100

## 2020-09-01 MED ORDER — ACETAMINOPHEN 325 MG PO TABS
650.0000 mg | ORAL_TABLET | Freq: Once | ORAL | Status: AC
  Administered 2020-09-01: 650 mg via ORAL

## 2020-09-01 MED ORDER — DIPHENHYDRAMINE HCL 25 MG PO CAPS
ORAL_CAPSULE | ORAL | Status: AC
  Filled 2020-09-01: qty 1

## 2020-09-01 MED ORDER — ACETAMINOPHEN 325 MG PO TABS
ORAL_TABLET | ORAL | Status: AC
  Filled 2020-09-01: qty 2

## 2020-09-01 MED ORDER — DIPHENHYDRAMINE HCL 25 MG PO CAPS
25.0000 mg | ORAL_CAPSULE | Freq: Once | ORAL | Status: AC
  Administered 2020-09-01: 25 mg via ORAL

## 2020-09-01 NOTE — Progress Notes (Signed)
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 We discussed limitation of CT imaging of the chest, abdomen and pelvis He had noncontrast CT scan of the abdomen and pelvis in May and did not undergo further staging prior to starting chemotherapy in September CT scan of the neck, however, was done prior to treatment and it showed near complete response to therapy Overall, I believe he has excellent response to single agent rituximab I plan to repeat CT imaging again in 3 months for further follow-up He has no side effects from single agent rituximab  Chronic kidney disease (CKD), 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[place]) He has severe chronic kidney disease stage IV On the right side of the kidney, he had multiple kidney stones causing ureteric obstruction I recommend he contact his urologist to see if there is anything else could be done to relieve obstruction and whether this might improve his renal function in the future  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   All questions were answered. The patient knows to call the clinic with any problems, questions or concerns. The total time spent in the appointment was 40 minutes encounter with  patients including review of chart and various tests results, discussions about plan of care and coordination of care plan   [name], MD 09/01/2020 1:49 PM  INTERVAL HISTORY: Please see below for problem oriented charting. He returns for further treatment and review of CT imaging According to the patient, he saw a urologist in the past for management of kidney stone He was placed on some medication of unknown name by a [place] physician The patient is a poor historian He tolerated recent treatment well The lymphadenopathy that was palpable on the neck is almost completely disappeared He had no side effects from treatment so far  SUMMARY OF ONCOLOGIC HISTORY: Oncology History Overview Note  Normal FISH   CLL (chronic lymphocytic leukemia) ([place])  09/10/2013 Initial Diagnosis   CLL (chronic lymphocytic leukemia)   12/26/2013 Pathology Results   FISH analysis was normal.   06/17/2014 Procedure   The patient has placement of Infuse-a-Port.   06/18/2014 Imaging   CT scan of the chest, abdomen and pelvis showed diffuse lymphadenopathy and splenomegaly.   06/19/2014 Bone Marrow Biopsy   Bone marrow aspirate and biopsy show CLL.   06/24/2014 - 11/18/2014 Chemotherapy   He received 6 cycles of Obinutuzumab and chlorambucil   09/15/2014 Imaging   Repeat CT scan of the chest, abdomen and pelvis show greater than 50% reduction in lymphadenopathy and splenomegaly   12/19/2014 Imaging   Repeat CT scan showed complete resolution of lymphadenopathy and splenomegaly.   03/09/2020 Cancer Staging   Staging form: Chronic Lymphocytic Leukemia / Small Lymphocytic Lymphoma, AJCC 8th Edition - Clinical stage from 03/09/2020:  Modified Rai Stage I (Modified Rai risk: Intermediate, Lymphocytosis: Present, Adenopathy: Present, Organomegaly: Absent, Anemia: Absent, Thrombocytopenia: Absent) - Signed by [name], MD on 03/09/2020   04/22/2020 Imaging   CT neck 1. Cervical adenopathy involving the bilateral  level 1B, right 2/3 and bilateral level 5 nodal stations. 2. Prominent to mildly enlarged upper mediastinal nodes. 3. Prominent subcentimeter right intraparotid node.     06/03/2020 -  Chemotherapy   The patient had rituximab for chemotherapy treatment.     08/31/2020 Imaging   CT neck 1. Regression of Leukemia. Resolved widespread cervical lymphadenopathy since August. Largest residual left level IIIb node now 8-9 mm short axis (previously 16 mm). 2.  CT Chest, Abdomen, and Pelvis today are reported separately.     08/31/2020 Imaging   Limited evaluation due to lack of intravenous contrast administration.   7.1 cm left perirenal soft tissue mass along the posterior left upper kidney, suspicious for perirenal lymphoma, although poorly evaluated. This is essentially new from May 2021.   Possible periureteral soft tissue along the right proximal collecting system/ureter, equivocal.   New 12 mm right lower lobe pulmonary nodule, suspicious for metastasis/pulmonary lymphoma. Additional 6 mm (mean diameter) subpleural nodule in the lingula is new from 2016, indeterminate.   No suspicious lymphadenopathy in the chest, abdomen, or pelvis. Spleen is normal in size.       REVIEW OF SYSTEMS:   Constitutional: Denies fevers, chills or abnormal weight loss Eyes: Denies blurriness of vision Ears, nose, mouth, throat, and face: Denies mucositis or sore throat Respiratory: Denies cough, dyspnea or wheezes Cardiovascular: Denies palpitation, chest discomfort or lower extremity swelling Gastrointestinal:  Denies nausea, heartburn or change in bowel habits Skin: Denies abnormal skin rashes Lymphatics: Denies new lymphadenopathy or easy bruising Neurological:Denies numbness, tingling or new weaknesses Behavioral/Psych: Mood is stable, no new changes  All other systems were reviewed with the patient and are negative.  I have reviewed the past medical history, past surgical history, social  history and family history with the patient and they are unchanged from previous note.  ALLERGIES:  is allergic to lipitor [atorvastatin].  MEDICATIONS:  Current Outpatient Medications  Medication Sig Dispense Refill  . acyclovir (ZOVIRAX) 400 MG tablet Take 1 tablet (400 mg total) by mouth daily. 30 tablet 3  . amiodarone (PACERONE) 200 MG tablet Take 1 tablet (200 mg total) by mouth daily. 90 tablet 3  . hydrOXYzine (ATARAX/VISTARIL) 25 MG tablet Take 25 mg by mouth at bedtime.    Marland Kitchen JARDIANCE 10 MG TABS tablet TAKE 1 TABLET BY MOUTH EVERY DAY 90 mg 3  . levothyroxine (SYNTHROID) 50 MCG tablet Take 50 mcg by mouth daily before breakfast.     . metoprolol succinate (TOPROL XL) 25 MG 24 hr tablet Take 0.5 tablets (12.5 mg total) by mouth daily. 45 tablet 3  . Omega-3 Fatty Acids (FISH OIL) 1200 MG CAPS Take 2,400 mg by mouth daily.     . ondansetron (ZOFRAN) 8 MG tablet Take 1 tablet (8 mg total) by mouth every 8 (eight) hours as needed for refractory nausea / vomiting. Start on day 2 after bendamustine chemo. 30 tablet 1  . prochlorperazine (COMPAZINE) 10 MG tablet Take 1 tablet (10 mg total) by mouth every 6 (six) hours as needed (Nausea or vomiting). 30 tablet 1  . rivaroxaban (XARELTO) 20 MG TABS tablet Take 1 tablet (20 mg total) by mouth daily with supper. 30 tablet 6  . rosuvastatin (CRESTOR) 10 MG tablet Take 10 mg  by mouth daily.    . tamsulosin (FLOMAX) 0.4 MG CAPS capsule Take 0.4 mg by mouth daily.     No current facility-administered medications for this visit.    PHYSICAL EXAMINATION: ECOG PERFORMANCE STATUS: 1 - Symptomatic but completely ambulatory  Vitals:   09/01/20 1222  BP: (!) 119/54  Pulse: 61  Resp: 18  Temp: 97.8 F (36.6 C)  SpO2: 99%   Filed Weights   09/01/20 1222  Weight: (!) 319 lb 6.4 oz (144.9 kg)    GENERAL:alert, no distress and comfortable Musculoskeletal:no cyanosis of digits and no clubbing  NEURO: alert & oriented x 3 with fluent speech,  no focal motor/sensory deficits  LABORATORY DATA:  I have reviewed the data as listed    Component Value Date/Time   NA 139 09/01/2020 1203   NA 138 10/25/2018 1120   NA 140 06/25/2015 0758   K 4.3 09/01/2020 1203   K 3.9 06/25/2015 0758   CL 109 09/01/2020 1203   CL 101 03/11/2013 1031   CO2 22 09/01/2020 1203   CO2 25 06/25/2015 0758   GLUCOSE 158 (H) 09/01/2020 1203   GLUCOSE 140 06/25/2015 0758   GLUCOSE 173 (H) 03/11/2013 1031   BUN 21 09/01/2020 1203   BUN 16 10/25/2018 1120   BUN 15.9 06/25/2015 0758   CREATININE 2.19 (H) 09/01/2020 1203   CREATININE 2.20 (H) 06/01/2020 0957   CREATININE 1.0 06/25/2015 0758   CALCIUM 9.2 09/01/2020 1203   CALCIUM 9.3 06/25/2015 0758   PROT 6.9 09/01/2020 1203   PROT 6.7 06/25/2015 0758   ALBUMIN 3.8 09/01/2020 1203   ALBUMIN 3.8 06/25/2015 0758   AST 15 09/01/2020 1203   AST 15 06/01/2020 0957   AST 15 06/25/2015 0758   ALT 17 09/01/2020 1203   ALT 11 06/01/2020 0957   ALT 14 06/25/2015 0758   ALKPHOS 78 09/01/2020 1203   ALKPHOS 61 06/25/2015 0758   BILITOT 0.8 09/01/2020 1203   BILITOT 0.8 06/01/2020 0957   BILITOT 0.95 06/25/2015 0758   GFRNONAA 30 (L) 09/01/2020 1203   GFRNONAA 28 (L) 06/01/2020 0957   GFRAA 29 (L) 06/09/2020 0847   GFRAA 32 (L) 06/01/2020 0957    No results found for: SPEP, UPEP  Lab Results  Component Value Date   WBC 6.4 09/01/2020   NEUTROABS 4.5 09/01/2020   HGB 12.8 (L) 09/01/2020   HCT 36.2 (L) 09/01/2020   MCV 88.3 09/01/2020   PLT 143 (L) 09/01/2020      Chemistry      Component Value Date/Time   NA 139 09/01/2020 1203   NA 138 10/25/2018 1120   NA 140 06/25/2015 0758   K 4.3 09/01/2020 1203   K 3.9 06/25/2015 0758   CL 109 09/01/2020 1203   CL 101 03/11/2013 1031   CO2 22 09/01/2020 1203   CO2 25 06/25/2015 0758   BUN 21 09/01/2020 1203   BUN 16 10/25/2018 1120   BUN 15.9 06/25/2015 0758   CREATININE 2.19 (H) 09/01/2020 1203   CREATININE 2.20 (H) 06/01/2020 0957    CREATININE 1.0 06/25/2015 0758      Component Value Date/Time   CALCIUM 9.2 09/01/2020 1203   CALCIUM 9.3 06/25/2015 0758   ALKPHOS 78 09/01/2020 1203   ALKPHOS 61 06/25/2015 0758   AST 15 09/01/2020 1203   AST 15 06/01/2020 0957   AST 15 06/25/2015 0758   ALT 17 09/01/2020 1203   ALT 11 06/01/2020 0957   ALT 14 06/25/2015 0758  BILITOT 0.8 09/01/2020 1203   BILITOT 0.8 06/01/2020 0957   BILITOT 0.95 06/25/2015 0758       RADIOGRAPHIC STUDIES: I have reviewed multiple CT imaging with the patient and family I have personally reviewed the radiological images as listed and agreed with the findings in the report. CT Soft Tissue Neck Wo Contrast  Result Date: 08/31/2020 CLINICAL DATA:  79 year old male with CLL, clinically responding to treatment. EXAM: CT NECK WITHOUT CONTRAST TECHNIQUE: Multidetector CT imaging of the neck was performed following the standard protocol without intravenous contrast. COMPARISON:  Neck CT 04/22/2020. CT Chest, Abdomen, and Pelvis today are reported separately. FINDINGS: Pharynx and larynx: Laryngeal and pharyngeal soft tissue contours are within normal limits. Negative parapharyngeal spaces. Retropharyngeal course of both carotids (with calcified atherosclerosis on series 2, image 43), normal variant. Salivary glands: Negative noncontrast sublingual space, submandibular and parotid spaces. Thyroid: Negative. Lymph nodes: Resolved widespread cervical lymphadenopathy since August. Residual small but irregular left level IIIb lymph node now measures 8-9 mm short axis on series 2, image 38 (previously 16 mm). Diminutive lymph nodes elsewhere. Vascular: Vascular patency is not evaluated in the absence of IV contrast. Calcified cervical atherosclerosis greater on the left. Limited intracranial: Calcified atherosclerosis at the skull base. Otherwise negative. Visualized orbits: Negative. Mastoids and visualized paranasal sinuses: Visualized paranasal sinuses and mastoids  are stable and well pneumatized. Skeleton: Absent dentition. Cervical spine degeneration. No acute or suspicious osseous lesion. Upper chest: Reported separately today. IMPRESSION: 1. Regression of Leukemia. Resolved widespread cervical lymphadenopathy since August. Largest residual left level IIIb node now 8-9 mm short axis (previously 16 mm). 2.  CT Chest, Abdomen, and Pelvis today are reported separately. Electronically Signed   By: Genevie Ann M.D.   On: 08/31/2020 13:04   ECHO TEE  Result Date: 08/28/2020    TRANSESOPHOGEAL ECHO REPORT   Patient Name:   Derek [name] Date of Exam: 08/28/2020 Medical Rec #:  [id number]   Height:       76.0 in Accession #:    [id number]  Weight:       316.2 lb Date of Birth:  October 14, 1940   BSA:          2.691 m Patient Age:    79 years    BP:           148/85 mmHg Patient Gender: M           HR:           133 bpm. Exam Location:  Inpatient Procedure: Transesophageal Echo Indications:    Atrial flutter  History:        Patient has prior history of Echocardiogram examinations.                 Arrythmias:Atrial Flutter.  Sonographer:    Raquel Sarna Senior Referring Phys: [id number] [place] R [place] PROCEDURE: The transesophogeal probe was passed without difficulty through the esophogus of the patient. Sedation performed by different physician. The patient developed no complications during the procedure. IMPRESSIONS  1. No LAA thrombus. [place] x 1 200J converted to NSR rate 88 bpm On Xarelto No immediate neurologic sequelae.  2. Left ventricular ejection fraction, by estimation, is 45 to 50%. The left ventricle has mildly decreased function. The left ventricle demonstrates global hypokinesis.  3. Right ventricular systolic function is normal. The right ventricular size is normal.  4. Left atrial size was mildly dilated. No left atrial/left atrial appendage thrombus was detected.  5. The mitral valve is normal  in structure. Moderate mitral valve regurgitation.  6. The aortic valve is tricuspid. Aortic  valve regurgitation is not visualized. No aortic stenosis is present. FINDINGS  Left Ventricle: Left ventricular ejection fraction, by estimation, is 45 to 50%. The left ventricle has mildly decreased function. The left ventricle demonstrates global hypokinesis. The left ventricular internal cavity size was normal in size. There is  no left ventricular hypertrophy. Right Ventricle: The right ventricular size is normal. Right vetricular wall thickness was not assessed. Right ventricular systolic function is normal. Left Atrium: Left atrial size was mildly dilated. No left atrial/left atrial appendage thrombus was detected. Right Atrium: Right atrial size was normal in size. Pericardium: There is no evidence of pericardial effusion. Mitral Valve: The mitral valve is normal in structure. Moderate mitral valve regurgitation. Tricuspid Valve: The tricuspid valve is normal in structure. Tricuspid valve regurgitation is mild. Aortic Valve: The aortic valve is tricuspid. Aortic valve regurgitation is not visualized. No aortic stenosis is present. Pulmonic Valve: The pulmonic valve was normal in structure. Pulmonic valve regurgitation is trivial. Aorta: The aortic root is normal in size and structure. IAS/Shunts: No atrial level shunt detected by color flow Doppler. Additional Comments: No LAA thrombus. [place] x 1 200J converted to NSR rate 88 bpm On Xarelto No immediate neurologic sequelae. [name] Rouge MD Electronically signed by [name] Rouge MD Signature Date/Time: 08/28/2020/10:35:17 AM    Final    CT CHEST ABDOMEN PELVIS WO CONTRAST  Result Date: 08/31/2020 CLINICAL DATA:  CLL EXAM: CT CHEST, ABDOMEN AND PELVIS WITHOUT CONTRAST TECHNIQUE: Multidetector CT imaging of the chest, abdomen and pelvis was performed following the standard protocol without IV contrast. COMPARISON:  CT abdomen/pelvis dated 01/31/2020. CT chest abdomen pelvis dated 12/19/2014. FINDINGS: CT CHEST FINDINGS Cardiovascular: Heart is normal in size.   No pericardial effusion. No evidence of thoracic aortic aneurysm. Mild atherosclerotic calcifications of the aortic arch. Three vessel coronary atherosclerosis. Mediastinum/Nodes: No suspicious mediastinal or axillary lymphadenopathy. Visualized thyroid is unremarkable. Lungs/Pleura: 12 mm subpleural nodule in the right lower lobe (series 6/image 130), new from May 2021. Additional 4 x 8 mm subpleural nodule in the lingula (series 6/image 91), not recently imaged, new from 2016. Mild subpleural reticulation/fibrosis in the lungs bilaterally. No focal consolidation. No pleural effusion or pneumothorax. Musculoskeletal: Degenerative changes of the thoracic spine. CT ABDOMEN PELVIS FINDINGS Hepatobiliary: Liver is within normal limits. Status post cholecystectomy. No intrahepatic or extrahepatic ductal dilatation. Pancreas: Within normal limits. Spleen: Normal in size. Adrenals/Urinary Tract: Adrenal glands are within normal limits. Perirenal soft tissue mass along the left upper kidney measuring at least 4.2 x 7.1 cm (series 2/image 81), difficult to discretely measure on unenhanced CT, essentially new from the prior. Multiple nonobstructing right renal calculi, measuring up to 10 mm in the right lower pole (series 2/image 81) and 12 mm in the right proximal ureter/collecting system (coronal image 91). No hydronephrosis. However, there is a suspected periureteral soft tissue along the right collecting system/proximal ureter (series 2/image 80), although this is poorly evaluated on unenhanced CT. Bladder is within normal limits. Stomach/Bowel: Stomach is notable for a tiny hiatal hernia. No evidence of bowel obstruction. Normal appendix (series 2/image 111). Extensive left colonic diverticulosis, without evidence of diverticulitis. Vascular/Lymphatic: No evidence of abdominal aortic aneurysm. Atherosclerotic calcifications of the abdominal aorta and branch vessels. No suspicious abdominopelvic lymphadenopathy.  Reproductive: Prostate is unremarkable. Other: No abdominopelvic ascites. Musculoskeletal: Mild degenerative changes of the lumbar spine. IMPRESSION: Limited evaluation due to lack of intravenous contrast administration. 7.1  cm left perirenal soft tissue mass along the posterior left upper kidney, suspicious for perirenal lymphoma, although poorly evaluated. This is essentially new from May 2021. Possible periureteral soft tissue along the right proximal collecting system/ureter, equivocal. New 12 mm right lower lobe pulmonary nodule, suspicious for metastasis/pulmonary lymphoma. Additional 6 mm (mean diameter) subpleural nodule in the lingula is new from 2016, indeterminate. No suspicious lymphadenopathy in the chest, abdomen, or pelvis. Spleen is normal in size. Electronically Signed   By: Julian Hy M.D.   On: 08/31/2020 16:49

## 2020-09-01 NOTE — Assessment & Plan Note (Signed)
Clinically, he is responding to treatment We discussed limitation of CT imaging of the chest, abdomen and pelvis He had noncontrast CT scan of the abdomen and pelvis in May and did not undergo further staging prior to starting chemotherapy in September CT scan of the neck, however, was done prior to treatment and it showed near complete response to therapy Overall, I believe he has excellent response to single agent rituximab I plan to repeat CT imaging again in 3 months for further follow-up He has no side effects from single agent rituximab

## 2020-09-01 NOTE — Assessment & Plan Note (Signed)
The cause is unknown He is not symptomatic Observe only with another imaging study in 3 months

## 2020-09-01 NOTE — Telephone Encounter (Signed)
Per Dr.Gorsuch, pt has CKD and will be treated regardless of creatinine. Pt will only receive rituxan. Pt infusion nurse Leah, RN called and made aware.

## 2020-09-01 NOTE — Patient Instructions (Signed)
[facility] Discharge Instructions for Patients Receiving Chemotherapy  Today you received the following chemotherapy agents Rituximab--pvvr  To help prevent nausea and vomiting after your treatment, we encourage you to take your nausea medication as directed   If you develop nausea and vomiting that is not controlled by your nausea medication, call the clinic.   BELOW ARE SYMPTOMS THAT SHOULD BE REPORTED IMMEDIATELY:  *FEVER GREATER THAN 100.5 F  *CHILLS WITH OR WITHOUT FEVER  NAUSEA AND VOMITING THAT IS NOT CONTROLLED WITH YOUR NAUSEA MEDICATION  *UNUSUAL SHORTNESS OF BREATH  *UNUSUAL BRUISING OR BLEEDING  TENDERNESS IN MOUTH AND THROAT WITH OR WITHOUT PRESENCE OF ULCERS  *URINARY PROBLEMS  *BOWEL PROBLEMS  UNUSUAL RASH Items with * indicate a potential emergency and should be followed up as soon as possible.  Feel free to call the clinic should you have any questions or concerns. The clinic phone number is [phone].  Please show the CHEMO ALERT CARD at check-in to the Emergency Department and triage nurse.   

## 2020-09-01 NOTE — Assessment & Plan Note (Signed)
We have extensive discussions about goals of care He is in agreement to continue treatment for few more months

## 2020-09-01 NOTE — Assessment & Plan Note (Signed)
The pancytopenia is likely due to alcohol intake or CLL related to chronic kidney disease stage IV We discussed the importance of him staying abstinent from alcohol intake

## 2020-09-01 NOTE — Assessment & Plan Note (Signed)
He has severe chronic kidney disease stage IV On the right side of the kidney, he had multiple kidney stones causing ureteric obstruction I recommend he contact his urologist to see if there is anything else could be done to relieve obstruction and whether this might improve his renal function in the future

## 2020-09-02 NOTE — Progress Notes (Signed)
Primary Care Physician: Wenda Low, MD Primary Cardiologist: Dr Marlou Porch Primary Electrophysiologist: none Referring Physician: Dr Briscoe Burns is a 79 y.o. male with a history of DM, HTN, CAD, CLL, HLD, atrial flutter, and atrial fibrillation who presents for consultation in the [facility]. The patient was initially diagnosed with atrial flutter 2019 and underwent TEE/DCCV on 09/05/18. He was in afib on follow up and was started on amiodarone and had repeat DCCV on 10/2018. He has done well since that time until about 3 days ago when he started having symptoms of dizziness, SOB, and fatigue. His smart watch showed elevated heart rates. There were no specific triggers that he could identify. He has a CHADS2VASC score of 5 but unfortunately has only been taking Eliquis 5 mg once daily. He denies any significant snoring but does drink 4-5 alcoholic beverages daily. His anticoagulation has been changed to Xarelto.   On follow up today, he is s/p TEE guided DCCV on 08/28/20. He reports that he feels much better with more stamina since the procedure. He did have some throat discomfort post TEE but this is resolving. He reports compliance with anticoagulation.   Today, he denies symptoms of palpitations, chest pain, orthopnea, PND, lower extremity edema, presyncope, syncope, snoring, daytime somnolence, bleeding, or neurologic sequela. The patient is tolerating medications without difficulties and is otherwise without complaint today.    Atrial Fibrillation Risk Factors:  he does not have symptoms or diagnosis of sleep apnea. he does not have a history of rheumatic fever. he does have a history of alcohol use. The patient does not have a history of early familial atrial fibrillation or other arrhythmias.  he has a BMI of Body mass index is 38.95 kg/m.Marland Kitchen Filed Weights   09/03/20 0910  Weight: (!) 145.2 kg    Family History  Problem Relation Age of Onset   . Heart attack Mother   . Cancer Father        liver     Atrial Fibrillation Management history:  Previous antiarrhythmic drugs: amiodarone  Previous cardioversions: 08/2018, 10/2018, 08/28/20 Previous ablations: none CHADS2VASC score: 5 Anticoagulation history: Eliquis, Xarelto    Past Medical History:  Diagnosis Date  . Blood dyscrasia    cll remission  . 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 Diabetes mellitus, type II ([place])   . Dilated cardiomyopathy ([place]) 09/14/2018   AFlutter >> EF 40-45 pre DCCV and 45-50 post DCCV in 08/2018 // probable tachycardia mediated.  Marland Kitchen GERD (gastroesophageal reflux disease)    occ tums  . H/O cardiac catheterization   . H/O exercise stress test 1999, 2002  . Hypertension   . Hypertriglyceridemia   . Lymphocytosis   . Obesity   . Pulmonary Function Test 09/2018   PFTs 09/2018:  FEV1 88% predicted; FEV1/FVC 78%; DLCO cor 58   Past Surgical History:  Procedure Laterality Date  . CARDIOVERSION N/A 09/05/2018   Procedure: CARDIOVERSION;  Surgeon: Jerline Pain, MD;  Location: [place];  Service: Cardiovascular;  Laterality: N/A;  . CARDIOVERSION N/A 10/22/2018   Procedure: CARDIOVERSION;  Surgeon: Jerline Pain, MD;  Location: [facility] ENDOSCOPY;  Service: Cardiovascular;  Laterality: N/A;  . CARDIOVERSION N/A 08/28/2020   Procedure: CARDIOVERSION;  Surgeon: Josue [name], MD;  Location: [facility] ENDOSCOPY;  Service: Cardiovascular;  Laterality: N/A;  . CHOLECYSTECTOMY N/A 07/12/2016   Procedure: LAPAROSCOPIC CHOLECYSTECTOMY;  Surgeon: Donnie Mesa, MD;  Location: [place];  Service: General;  Laterality:  N/A;  . EXTRACORPOREAL SHOCK WAVE LITHOTRIPSY Right 11/18/2019   Procedure: EXTRACORPOREAL SHOCK WAVE LITHOTRIPSY (ESWL);  Surgeon: Lucas Mallow, MD;  [facility]: [facility];  Service: Urology;  Laterality: Right;  . FOOT SURGERY Right   . NASAL SINUS SURGERY    . TEE WITHOUT CARDIOVERSION N/A 09/05/2018    Procedure: TRANSESOPHAGEAL ECHOCARDIOGRAM (TEE);  Surgeon: Jerline Pain, MD;  Location: [facility] ENDOSCOPY;  Service: Cardiovascular;  Laterality: N/A;  . TEE WITHOUT CARDIOVERSION N/A 08/28/2020   Procedure: TRANSESOPHAGEAL ECHOCARDIOGRAM (TEE);  Surgeon: Josue [name], MD;  Location: [facility] ENDOSCOPY;  Service: Cardiovascular;  Laterality: N/A;  . TONSILLECTOMY    . UMBILICAL HERNIA REPAIR N/A 07/12/2016   Procedure: [place];  Surgeon: Donnie Mesa, MD;  Location: [place];  Service: General;  Laterality: N/A;  . vericose vein stripping      Current Outpatient Medications  Medication Sig Dispense Refill  . acyclovir (ZOVIRAX) 400 MG tablet Take 1 tablet (400 mg total) by mouth daily. 30 tablet 3  . albuterol (VENTOLIN HFA) 108 (90 Base) MCG/ACT inhaler 1 puff as needed    . amiodarone (PACERONE) 200 MG tablet Take 1 tablet (200 mg total) by mouth daily. 90 tablet 3  . doxepin (SINEQUAN) 10 MG capsule TAKE 1 CAPSULE BY MOUTH EVERYDAY AT BEDTIME    . glimepiride (AMARYL) 2 MG tablet Take 2 mg by mouth daily.    Marland Kitchen glucose blood test strip Dispense glucometer, strips and lancets preferred by patient's insurance (DX: E11.9 / type 2 DM)    . hydrOXYzine (ATARAX/VISTARIL) 25 MG tablet Take 25 mg by mouth at bedtime.    Marland Kitchen JARDIANCE 10 MG TABS tablet TAKE 1 TABLET BY MOUTH EVERY DAY 90 mg 3  . ketorolac (ACULAR) 0.5 % ophthalmic solution     . levothyroxine (SYNTHROID) 50 MCG tablet Take 50 mcg by mouth daily before breakfast.     . metoprolol succinate (TOPROL XL) 25 MG 24 hr tablet Take 0.5 tablets (12.5 mg total) by mouth daily. 45 tablet 3  . Needle, Disp, (HYPODERMIC NEEDLE 26GX5/8") 26G X 5/8" MISC See admin instructions.    Marland Kitchen ofloxacin (OCUFLOX) 0.3 % ophthalmic solution     . Omega-3 Fatty Acids (FISH OIL) 1200 MG CAPS Take 2,400 mg by mouth daily.     . prednisoLONE acetate (PRED FORTE) 1 % ophthalmic suspension SMARTSIG:In Eye(s)    . rivaroxaban (XARELTO) 20 MG TABS tablet Take  1 tablet (20 mg total) by mouth daily with supper. 30 tablet 6  . rosuvastatin (CRESTOR) 10 MG tablet Take 10 mg by mouth daily.    . sildenafil (VIAGRA) 25 MG tablet 1 tablet as needed    . tamsulosin (FLOMAX) 0.4 MG CAPS capsule Take 0.4 mg by mouth daily.    Marland Kitchen testosterone cypionate (DEPOTESTOSTERONE CYPIONATE) 200 MG/ML injection 69ml    . triamcinolone ointment (KENALOG) 0.5 % Apply topically.     No current facility-administered medications for this encounter.    Allergies  Allergen Reactions  . Lipitor [Atorvastatin] Rash    Social History   Socioeconomic History  . Marital status: Married    Spouse name: Not on file  . Number of children: 3  . Years of education: Not on file  . Highest education level: Not on file  Occupational History    Employer: [employer]    Comment: retied sale; asphalt work   Tobacco Use  . Smoking status: Former Smoker    Packs/day: 3.00  Years: 10.00    Pack years: 30.00    Quit date: 09/19/1988    Years since quitting: 31.9  . Smokeless tobacco: Never Used  Vaping Use  . Vaping Use: Never used  Substance and Sexual Activity  . Alcohol use: Yes    Alcohol/week: 18.0 - 19.0 standard drinks    Types: 14 Shots of liquor, 4 - 5 Standard drinks or equivalent per week  . Drug use: No  . Sexual activity: Not on file  Other Topics Concern  . Not on file  Social History Narrative  . Not on file   Social Determinants of Health   Financial Resource Strain: Not on file  Food Insecurity: Not on file  Transportation Needs: Not on file  Physical Activity: Not on file  Stress: Not on file  Social Connections: Not on file  Intimate Partner Violence: Not on file     ROS- All systems are reviewed and negative except as per the HPI above.  Physical Exam: Vitals:   09/03/20 0910  BP: (!) 142/74  Pulse: (!) 56  Weight: (!) 145.2 kg  Height: 6\' 4"  (1.93 m)    GEN- The patient is well appearing obese elderly male, alert and oriented x 3  today.   HEENT-head normocephalic, atraumatic, sclera clear, conjunctiva pink, hearing intact, trachea midline. Lungs- Clear to ausculation bilaterally, normal work of breathing Heart- Regular rate and rhythm, bradycardia, no murmurs, rubs or gallops  GI- soft, NT, ND, + BS Extremities- no clubbing, cyanosis, or edema MS- no significant deformity or atrophy Skin- no rash or lesion Psych- euthymic mood, full affect Neuro- strength and sensation are intact   Wt Readings from Last 3 Encounters:  09/03/20 (!) 145.2 kg  09/01/20 (!) 144.9 kg  08/28/20 (!) 142.9 kg    EKG today demonstrates SB HR 56, 1st degree AV block, LAFB, PR 250, QRS 120, QTc 405  TEE 09/05/18 demonstrated  - Left ventricle: The cavity size was normal. Wall thickness was  normal. Systolic function was mildly to moderately reduced. The  estimated ejection fraction was in the range of 40% to 45%.  Hypokinesis of the anteroseptal myocardium.  - Aortic valve: No evidence of vegetation.  - Mitral valve: No evidence of vegetation. There was mild  regurgitation.  - Left atrium: The atrium was dilated. No evidence of thrombus in  the appendage. No evidence of thrombus in the atrial cavity or  appendage.  - Right atrium: No evidence of thrombus in the atrial cavity or  appendage.  - Atrial septum: There was increased thickness of the septum,  consistent with lipomatous hypertrophy. No defect or patent  foramen ovale was identified.  - Tricuspid valve: No evidence of vegetation.  - Pulmonic valve: No evidence of vegetation.  - Superior vena cava: The study excluded a thrombus.   Impressions:   - Post cardioversion, EF appeared improved 45-50%   Epic records are reviewed at length today  CHA2DS2-VASc Score = 5  The patient's score is based upon: CHF History: No HTN History: Yes Diabetes History: Yes Stroke History: No Vascular Disease History: Yes Age Score: 2 Gender Score: 0       ASSESSMENT AND PLAN: 1. Persistent Atrial Fibrillation/atrial flutter The patient's CHA2DS2-VASc score is 5, indicating a 7.2% annual risk of stroke.   S/p TEE/DCCV on 08/28/20. Patient back in SR. Continue Xarelto 20 mg daily. Stressed importance of compliance with anticoagulation. Continue amiodarone 200 mg daily Continue Toprol 25 mg daily  2. Secondary  Hypercoagulable State (ICD10:  D68.69) The patient is at significant risk for stroke/thromboembolism based upon his CHA2DS2-VASc Score of 5.  Continue Rivaroxaban (Xarelto).   3. Obesity Body mass index is 38.95 kg/m. Lifestyle modification was discussed and encouraged including regular physical activity and weight reduction.  4. CAD No anginal symptoms.  5. HTN Stable, no changes today.   Follow up with Dr Marlou Porch per recall. AF clinic in 9 months.    [facility] [address] [place], [place] [address] [phone] 09/03/2020 9:19 AM

## 2020-09-02 NOTE — Telephone Encounter (Signed)
Called patient. Patient stated his throat has improved.

## 2020-09-03 ENCOUNTER — Encounter (HOSPITAL_COMMUNITY): Payer: Self-pay | Admitting: Physician Assistant

## 2020-09-03 ENCOUNTER — Other Ambulatory Visit: Payer: Self-pay

## 2020-09-03 ENCOUNTER — Ambulatory Visit (HOSPITAL_COMMUNITY)
Admission: RE | Admit: 2020-09-03 | Discharge: 2020-09-03 | Disposition: A | Discharge status: Home / Self Care | Payer: Medicare Other | Source: Ambulatory Visit | Attending: Physician Assistant | Admitting: Physician Assistant

## 2020-09-03 VITALS — BP 142/74 | HR 56 | Ht 76.0 in | Wt 320.0 lb

## 2020-09-03 DIAGNOSIS — Z79890 Hormone replacement therapy (postmenopausal): Secondary | ICD-10-CM | POA: Diagnosis not present

## 2020-09-03 DIAGNOSIS — E119 Type 2 diabetes mellitus without complications: Secondary | ICD-10-CM | POA: Diagnosis not present

## 2020-09-03 DIAGNOSIS — Z7984 Long term (current) use of oral hypoglycemic drugs: Secondary | ICD-10-CM | POA: Diagnosis not present

## 2020-09-03 DIAGNOSIS — Z79899 Other long term (current) drug therapy: Secondary | ICD-10-CM | POA: Insufficient documentation

## 2020-09-03 DIAGNOSIS — Z6838 Body mass index (BMI) 38.0-38.9, adult: Secondary | ICD-10-CM | POA: Diagnosis not present

## 2020-09-03 DIAGNOSIS — Z7901 Long term (current) use of anticoagulants: Secondary | ICD-10-CM | POA: Diagnosis not present

## 2020-09-03 DIAGNOSIS — C9110 Chronic lymphocytic leukemia of B-cell type not having achieved remission: Secondary | ICD-10-CM | POA: Insufficient documentation

## 2020-09-03 DIAGNOSIS — Z87891 Personal history of nicotine dependence: Secondary | ICD-10-CM | POA: Diagnosis not present

## 2020-09-03 DIAGNOSIS — E669 Obesity, unspecified: Secondary | ICD-10-CM | POA: Diagnosis not present

## 2020-09-03 DIAGNOSIS — I2510 Atherosclerotic heart disease of native coronary artery without angina pectoris: Secondary | ICD-10-CM | POA: Diagnosis not present

## 2020-09-03 DIAGNOSIS — Z8249 Family history of ischemic heart disease and other diseases of the circulatory system: Secondary | ICD-10-CM | POA: Insufficient documentation

## 2020-09-03 DIAGNOSIS — Z9049 Acquired absence of other specified parts of digestive tract: Secondary | ICD-10-CM | POA: Diagnosis not present

## 2020-09-03 DIAGNOSIS — D6869 Other thrombophilia: Secondary | ICD-10-CM | POA: Diagnosis not present

## 2020-09-03 DIAGNOSIS — E785 Hyperlipidemia, unspecified: Secondary | ICD-10-CM | POA: Diagnosis not present

## 2020-09-03 DIAGNOSIS — I4819 Other persistent atrial fibrillation: Secondary | ICD-10-CM

## 2020-09-03 DIAGNOSIS — I10 Essential (primary) hypertension: Secondary | ICD-10-CM | POA: Diagnosis not present

## 2020-09-03 LAB — BASIC METABOLIC PANEL
Anion gap: 9 (ref 5–15)
BUN: 19 mg/dL (ref 8–23)
CO2: 21 mmol/L — ABNORMAL LOW (ref 22–32)
Calcium: 9.3 mg/dL (ref 8.9–10.3)
Chloride: 108 mmol/L (ref 98–111)
Creatinine, Ser: 2.05 mg/dL — ABNORMAL HIGH (ref 0.61–1.24)
GFR, Estimated: 33 mL/min — ABNORMAL LOW (ref >60)
Glucose, Bld: 178 mg/dL — ABNORMAL HIGH (ref 70–99)
Potassium: 4.5 mmol/L (ref 3.5–5.1)
Sodium: 138 mmol/L (ref 135–145)

## 2020-09-04 DIAGNOSIS — N200 Calculus of kidney: Secondary | ICD-10-CM | POA: Diagnosis not present

## 2020-09-04 DIAGNOSIS — N1832 Chronic kidney disease, stage 3b: Secondary | ICD-10-CM | POA: Diagnosis not present

## 2020-09-04 DIAGNOSIS — E1122 Type 2 diabetes mellitus with diabetic chronic kidney disease: Secondary | ICD-10-CM | POA: Diagnosis not present

## 2020-09-04 DIAGNOSIS — I129 Hypertensive chronic kidney disease with stage 1 through stage 4 chronic kidney disease, or unspecified chronic kidney disease: Secondary | ICD-10-CM | POA: Diagnosis not present

## 2020-09-04 DIAGNOSIS — I4892 Unspecified atrial flutter: Secondary | ICD-10-CM | POA: Diagnosis not present

## 2020-09-04 DIAGNOSIS — C9110 Chronic lymphocytic leukemia of B-cell type not having achieved remission: Secondary | ICD-10-CM | POA: Diagnosis not present

## 2020-09-08 ENCOUNTER — Other Ambulatory Visit: Payer: Medicare Other

## 2020-09-08 ENCOUNTER — Ambulatory Visit: Payer: Medicare Other | Admitting: Hematology and Oncology

## 2020-09-08 LAB — SURGICAL PATHOLOGY

## 2020-09-16 DIAGNOSIS — E119 Type 2 diabetes mellitus without complications: Secondary | ICD-10-CM | POA: Diagnosis not present

## 2020-09-16 DIAGNOSIS — I2510 Atherosclerotic heart disease of native coronary artery without angina pectoris: Secondary | ICD-10-CM | POA: Diagnosis not present

## 2020-09-16 DIAGNOSIS — I10 Essential (primary) hypertension: Secondary | ICD-10-CM | POA: Diagnosis not present

## 2020-09-16 DIAGNOSIS — N400 Enlarged prostate without lower urinary tract symptoms: Secondary | ICD-10-CM | POA: Diagnosis not present

## 2020-09-16 DIAGNOSIS — E1122 Type 2 diabetes mellitus with diabetic chronic kidney disease: Secondary | ICD-10-CM | POA: Diagnosis not present

## 2020-09-16 DIAGNOSIS — N182 Chronic kidney disease, stage 2 (mild): Secondary | ICD-10-CM | POA: Diagnosis not present

## 2020-09-16 DIAGNOSIS — E785 Hyperlipidemia, unspecified: Secondary | ICD-10-CM | POA: Diagnosis not present

## 2020-09-16 DIAGNOSIS — E039 Hypothyroidism, unspecified: Secondary | ICD-10-CM | POA: Diagnosis not present

## 2020-09-16 DIAGNOSIS — N183 Chronic kidney disease, stage 3 unspecified: Secondary | ICD-10-CM | POA: Diagnosis not present

## 2020-09-16 DIAGNOSIS — E782 Mixed hyperlipidemia: Secondary | ICD-10-CM | POA: Diagnosis not present

## 2020-10-06 ENCOUNTER — Inpatient Hospital Stay: Payer: Medicare Other

## 2020-10-06 ENCOUNTER — Telehealth: Payer: Self-pay

## 2020-10-06 ENCOUNTER — Inpatient Hospital Stay: Payer: Medicare Other | Admitting: Hematology and Oncology

## 2020-10-06 NOTE — Telephone Encounter (Signed)
Called regarding request for medical records to [facility] specialist. He is requesting records to be sent to [place] [facility]. He wants to get January and February treatment in [place]. He plans on coming back to [place] in March to resume treatment at [facility].

## 2020-10-06 NOTE — Telephone Encounter (Signed)
Faxed office note to [facility] at [phone]. Received confirmation.

## 2020-10-06 NOTE — Telephone Encounter (Signed)
He contacted lab and canceled today's appt due to weather.

## 2020-10-06 NOTE — Telephone Encounter (Signed)
Pls sent last progress notes

## 2020-10-14 DIAGNOSIS — E782 Mixed hyperlipidemia: Secondary | ICD-10-CM | POA: Diagnosis not present

## 2020-10-14 DIAGNOSIS — I2510 Atherosclerotic heart disease of native coronary artery without angina pectoris: Secondary | ICD-10-CM | POA: Diagnosis not present

## 2020-10-14 DIAGNOSIS — N182 Chronic kidney disease, stage 2 (mild): Secondary | ICD-10-CM | POA: Diagnosis not present

## 2020-10-14 DIAGNOSIS — I10 Essential (primary) hypertension: Secondary | ICD-10-CM | POA: Diagnosis not present

## 2020-10-14 DIAGNOSIS — E1122 Type 2 diabetes mellitus with diabetic chronic kidney disease: Secondary | ICD-10-CM | POA: Diagnosis not present

## 2020-10-14 DIAGNOSIS — N400 Enlarged prostate without lower urinary tract symptoms: Secondary | ICD-10-CM | POA: Diagnosis not present

## 2020-10-14 DIAGNOSIS — E039 Hypothyroidism, unspecified: Secondary | ICD-10-CM | POA: Diagnosis not present

## 2020-10-23 DIAGNOSIS — C9112 Chronic lymphocytic leukemia of B-cell type in relapse: Secondary | ICD-10-CM | POA: Diagnosis not present

## 2020-10-28 DIAGNOSIS — C9112 Chronic lymphocytic leukemia of B-cell type in relapse: Secondary | ICD-10-CM | POA: Diagnosis not present

## 2020-11-04 ENCOUNTER — Telehealth: Payer: Self-pay

## 2020-11-04 NOTE — Telephone Encounter (Signed)
Called to verify appt request to scheduling. He is moving back to [place] at the end of this week. Given fax #, he will have the facility fax office notes. He has not had a scan while in [place]. He did have treatment x1 while in [place] on 2/9.

## 2020-11-05 ENCOUNTER — Other Ambulatory Visit: Payer: Self-pay | Admitting: Hematology and Oncology

## 2020-11-05 NOTE — Telephone Encounter (Signed)
I sent scheduling msg 

## 2020-11-06 ENCOUNTER — Telehealth: Payer: Self-pay | Admitting: Hematology and Oncology

## 2020-11-06 NOTE — Telephone Encounter (Signed)
Scheduled appt per 2/17 sch msg - pt is aware of appt date and time   

## 2020-11-10 ENCOUNTER — Telehealth: Payer: Self-pay | Admitting: Cardiology

## 2020-11-10 NOTE — Telephone Encounter (Signed)
Patients rate was elevated this morning. HR currently in the 120's using pulse ox. Does not have BP reading.  Did not check it yesterday like he normally does. Shortness of breath while walking this morning, along with fatigue. Denies lightheadedness, dizzy, or pre syncope. Has not missed metoprolol succinate or Xarelto.    Will route to Dr. Marlou Porch and Afib clinic- [facility] for advisement. Advised to avoid caffeine, gave ED precautions, and to call if his heart rate returns to normal.  Patient verbalized understanding.

## 2020-11-10 NOTE — Telephone Encounter (Signed)
Patient c/o Palpitations:  High priority if patient c/o lightheadedness, shortness of breath, or chest pain  1) How long have you had palpitations/irregular HR/ Afib? Are you having the symptoms now? afib  2) Are you currently experiencing lightheadedness, SOB or CP? lightheadedness  3) Do you have a history of afib (atrial fibrillation) or irregular heart rhythm? Yes, afib  4) Have you checked your BP or HR? (document readings if available): HR 120 BP ?  5) Are you experiencing any other symptoms? Dizziness   Spoke to Vivien Presto, she is not on pt's DPR and she was not with patient. She advised that we could call him and speak with him directly about this issue.

## 2020-11-11 NOTE — Telephone Encounter (Signed)
Ok with me Thanks Nayla Dias, MD  

## 2020-11-11 NOTE — Telephone Encounter (Signed)
Pt's wife is calling due to not hearing back. I advised her of documentation stating the pt was called and left a message today. She requested her husband be called back due to him missing the call with out realizing. Please advise.

## 2020-11-11 NOTE — Telephone Encounter (Signed)
Called and left message for patient to call back so we can schedule an appt to come in to see Adline Peals, PA.

## 2020-11-11 NOTE — Telephone Encounter (Signed)
Called and spoke with patient, he is agreeable to appt 11/12/20 at 10:00 am with Adline Peals, PA.

## 2020-11-12 ENCOUNTER — Ambulatory Visit (HOSPITAL_COMMUNITY)
Admission: RE | Admit: 2020-11-12 | Discharge: 2020-11-12 | Disposition: A | Discharge status: Home / Self Care | Payer: Medicare Other | Source: Ambulatory Visit | Attending: Physician Assistant | Admitting: Physician Assistant

## 2020-11-12 ENCOUNTER — Encounter (HOSPITAL_COMMUNITY): Payer: Self-pay | Admitting: Physician Assistant

## 2020-11-12 ENCOUNTER — Other Ambulatory Visit: Payer: Self-pay

## 2020-11-12 VITALS — BP 130/78 | HR 130 | Ht 76.0 in | Wt 325.8 lb

## 2020-11-12 DIAGNOSIS — I4892 Unspecified atrial flutter: Secondary | ICD-10-CM | POA: Diagnosis not present

## 2020-11-12 DIAGNOSIS — I2510 Atherosclerotic heart disease of native coronary artery without angina pectoris: Secondary | ICD-10-CM | POA: Insufficient documentation

## 2020-11-12 DIAGNOSIS — Z6839 Body mass index (BMI) 39.0-39.9, adult: Secondary | ICD-10-CM | POA: Insufficient documentation

## 2020-11-12 DIAGNOSIS — Z7901 Long term (current) use of anticoagulants: Secondary | ICD-10-CM | POA: Diagnosis not present

## 2020-11-12 DIAGNOSIS — E039 Hypothyroidism, unspecified: Secondary | ICD-10-CM | POA: Diagnosis not present

## 2020-11-12 DIAGNOSIS — E785 Hyperlipidemia, unspecified: Secondary | ICD-10-CM | POA: Diagnosis not present

## 2020-11-12 DIAGNOSIS — N400 Enlarged prostate without lower urinary tract symptoms: Secondary | ICD-10-CM | POA: Diagnosis not present

## 2020-11-12 DIAGNOSIS — I4819 Other persistent atrial fibrillation: Secondary | ICD-10-CM | POA: Diagnosis not present

## 2020-11-12 DIAGNOSIS — E1122 Type 2 diabetes mellitus with diabetic chronic kidney disease: Secondary | ICD-10-CM | POA: Diagnosis not present

## 2020-11-12 DIAGNOSIS — D6869 Other thrombophilia: Secondary | ICD-10-CM | POA: Diagnosis not present

## 2020-11-12 DIAGNOSIS — E119 Type 2 diabetes mellitus without complications: Secondary | ICD-10-CM | POA: Diagnosis not present

## 2020-11-12 DIAGNOSIS — N183 Chronic kidney disease, stage 3 unspecified: Secondary | ICD-10-CM | POA: Diagnosis not present

## 2020-11-12 DIAGNOSIS — I484 Atypical atrial flutter: Secondary | ICD-10-CM

## 2020-11-12 DIAGNOSIS — E669 Obesity, unspecified: Secondary | ICD-10-CM | POA: Insufficient documentation

## 2020-11-12 DIAGNOSIS — E782 Mixed hyperlipidemia: Secondary | ICD-10-CM | POA: Diagnosis not present

## 2020-11-12 DIAGNOSIS — I444 Left anterior fascicular block: Secondary | ICD-10-CM | POA: Insufficient documentation

## 2020-11-12 DIAGNOSIS — N182 Chronic kidney disease, stage 2 (mild): Secondary | ICD-10-CM | POA: Diagnosis not present

## 2020-11-12 DIAGNOSIS — I10 Essential (primary) hypertension: Secondary | ICD-10-CM | POA: Insufficient documentation

## 2020-11-12 LAB — COMPREHENSIVE METABOLIC PANEL
ALT: 15 U/L (ref 0–44)
AST: 17 U/L (ref 15–41)
Albumin: 3.8 g/dL (ref 3.5–5.0)
Alkaline Phosphatase: 61 U/L (ref 38–126)
Anion gap: 10 (ref 5–15)
BUN: 28 mg/dL — ABNORMAL HIGH (ref 8–23)
CO2: 17 mmol/L — ABNORMAL LOW (ref 22–32)
Calcium: 8.7 mg/dL — ABNORMAL LOW (ref 8.9–10.3)
Chloride: 110 mmol/L (ref 98–111)
Creatinine, Ser: 2.12 mg/dL — ABNORMAL HIGH (ref 0.61–1.24)
GFR, Estimated: 31 mL/min — ABNORMAL LOW (ref >60)
Glucose, Bld: 154 mg/dL — ABNORMAL HIGH (ref 70–99)
Potassium: 4.4 mmol/L (ref 3.5–5.1)
Sodium: 137 mmol/L (ref 135–145)
Total Bilirubin: 0.9 mg/dL (ref 0.3–1.2)
Total Protein: 6.8 g/dL (ref 6.5–8.1)

## 2020-11-12 LAB — CBC
HCT: 40.8 % (ref 39.0–52.0)
Hemoglobin: 14 g/dL (ref 13.0–17.0)
MCH: 31.3 pg (ref 26.0–34.0)
MCHC: 34.3 g/dL (ref 30.0–36.0)
MCV: 91.1 fL (ref 80.0–100.0)
Platelets: 163 10*3/uL (ref 150–400)
RBC: 4.48 MIL/uL (ref 4.22–5.81)
RDW: 13.5 % (ref 11.5–15.5)
WBC: 8.3 10*3/uL (ref 4.0–10.5)
nRBC: 0 % (ref 0.0–0.2)

## 2020-11-12 LAB — T4, FREE: Free T4: 0.99 ng/dL (ref 0.61–1.12)

## 2020-11-12 LAB — TSH: TSH: 5.203 u[IU]/mL — ABNORMAL HIGH (ref 0.350–4.500)

## 2020-11-12 MED ORDER — AMIODARONE HCL 200 MG PO TABS: 200.0000 mg | ORAL_TABLET | Freq: Two times a day (BID) | ORAL | 3 refills | Status: DC

## 2020-11-12 NOTE — Addendum Note (Signed)
Encounter addended by: Juluis Mire, RN on: 11/12/2020 1:21 PM  Actions taken: Order list changed

## 2020-11-12 NOTE — Progress Notes (Signed)
Primary Care Physician: Wenda Low, MD Primary Cardiologist: Dr Marlou Porch Primary Electrophysiologist: none Referring Physician: Dr Briscoe Burns is a 80 y.o. male with a history of DM, HTN, CAD, CLL, HLD, atrial flutter, and atrial fibrillation who presents for consultation in the [facility]. The patient was initially diagnosed with atrial flutter 2019 and underwent TEE/DCCV on 09/05/18. He was in afib on follow up and was started on amiodarone and had repeat DCCV on 10/2018. He has done well since that time until about 3 days ago when he started having symptoms of dizziness, SOB, and fatigue. His smart watch showed elevated heart rates. There were no specific triggers that he could identify. He has a CHADS2VASC score of 5 but unfortunately has only been taking Eliquis 5 mg once daily. He denies any significant snoring but does drink 4-5 alcoholic beverages daily. His anticoagulation has been changed to Xarelto. He is s/p TEE guided DCCV on 08/28/20.   On follow up today, patient reports that he was back in afib 3-4 days ago with symptoms of palpitations and dyspnea with exertion. ECG today confirms he is in atrial flutter. Patient admits he has not been very active and has gained significant weight over the last several months. He also continues to drink ~5 alcoholic drinks several nights per week.   Today, he denies symptoms of chest pain, orthopnea, PND, lower extremity edema, presyncope, syncope, snoring, daytime somnolence, bleeding, or neurologic sequela. The patient is tolerating medications without difficulties and is otherwise without complaint today.    Atrial Fibrillation Risk Factors:  he does not have symptoms or diagnosis of sleep apnea. he does not have a history of rheumatic fever. he does have a history of alcohol use. The patient does not have a history of early familial atrial fibrillation or other arrhythmias.  he has a BMI of Body  mass index is 39.66 kg/m.Marland Kitchen Filed Weights   11/12/20 1008  Weight: (!) 147.8 kg    Family History  Problem Relation Age of Onset  . Heart attack Mother   . Cancer Father        liver     Atrial Fibrillation Management history:  Previous antiarrhythmic drugs: amiodarone  Previous cardioversions: 08/2018, 10/2018, 08/28/20 Previous ablations: none CHADS2VASC score: 5 Anticoagulation history: Eliquis, Xarelto    Past Medical History:  Diagnosis Date  . Blood dyscrasia    cll remission  . 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 Diabetes mellitus, type II ([place])   . Dilated cardiomyopathy ([place]) 09/14/2018   AFlutter >> EF 40-45 pre DCCV and 45-50 post DCCV in 08/2018 // probable tachycardia mediated.  Marland Kitchen GERD (gastroesophageal reflux disease)    occ tums  . H/O cardiac catheterization   . H/O exercise stress test 1999, 2002  . Hypertension   . Hypertriglyceridemia   . Lymphocytosis   . Obesity   . Pulmonary Function Test 09/2018   PFTs 09/2018:  FEV1 88% predicted; FEV1/FVC 78%; DLCO cor 58   Past Surgical History:  Procedure Laterality Date  . CARDIOVERSION N/A 09/05/2018   Procedure: CARDIOVERSION;  Surgeon: Jerline Pain, MD;  Location: [facility] ENDOSCOPY;  Service: Cardiovascular;  Laterality: N/A;  . CARDIOVERSION N/A 10/22/2018   Procedure: CARDIOVERSION;  Surgeon: Jerline Pain, MD;  Location: [facility] ENDOSCOPY;  Service: Cardiovascular;  Laterality: N/A;  . CARDIOVERSION N/A 08/28/2020   Procedure: CARDIOVERSION;  Surgeon: Josue [name], MD;  Location: [place];  Service: Cardiovascular;  Laterality: N/A;  . CHOLECYSTECTOMY N/A 07/12/2016   Procedure: LAPAROSCOPIC CHOLECYSTECTOMY;  Surgeon: Donnie Mesa, MD;  Location: [place];  Service: General;  Laterality: N/A;  . EXTRACORPOREAL SHOCK WAVE LITHOTRIPSY Right 11/18/2019   Procedure: EXTRACORPOREAL SHOCK WAVE LITHOTRIPSY (ESWL);  Surgeon: Lucas Mallow, MD;  Location: [facility];   Service: Urology;  Laterality: Right;  . FOOT SURGERY Right   . NASAL SINUS SURGERY    . TEE WITHOUT CARDIOVERSION N/A 09/05/2018   Procedure: TRANSESOPHAGEAL ECHOCARDIOGRAM (TEE);  Surgeon: Jerline Pain, MD;  Location: [facility] ENDOSCOPY;  Service: Cardiovascular;  Laterality: N/A;  . TEE WITHOUT CARDIOVERSION N/A 08/28/2020   Procedure: TRANSESOPHAGEAL ECHOCARDIOGRAM (TEE);  Surgeon: Josue [name], MD;  Location: [facility] ENDOSCOPY;  Service: Cardiovascular;  Laterality: N/A;  . TONSILLECTOMY    . UMBILICAL HERNIA REPAIR N/A 07/12/2016   Procedure: [place];  Surgeon: Donnie Mesa, MD;  Location: [place];  Service: General;  Laterality: N/A;  . vericose vein stripping      Current Outpatient Medications  Medication Sig Dispense Refill  . acyclovir (ZOVIRAX) 400 MG tablet Take 1 tablet (400 mg total) by mouth daily. 30 tablet 3  . albuterol (VENTOLIN HFA) 108 (90 Base) MCG/ACT inhaler 1 puff as needed    . amiodarone (PACERONE) 200 MG tablet Take 1 tablet (200 mg total) by mouth daily. 90 tablet 3  . doxepin (SINEQUAN) 10 MG capsule TAKE 1 CAPSULE BY MOUTH EVERYDAY AT BEDTIME    . glimepiride (AMARYL) 2 MG tablet Take 2 mg by mouth daily.    Marland Kitchen glucose blood test strip Dispense glucometer, strips and lancets preferred by patient's insurance (DX: E11.9 / type 2 DM)    . hydrOXYzine (ATARAX/VISTARIL) 25 MG tablet Take 25 mg by mouth at bedtime.    Marland Kitchen JARDIANCE 10 MG TABS tablet TAKE 1 TABLET BY MOUTH EVERY DAY 90 mg 3  . levothyroxine (SYNTHROID) 50 MCG tablet Take 50 mcg by mouth daily before breakfast.     . metoprolol succinate (TOPROL XL) 25 MG 24 hr tablet Take 0.5 tablets (12.5 mg total) by mouth daily. 45 tablet 3  . Needle, Disp, (HYPODERMIC NEEDLE 26GX5/8") 26G X 5/8" MISC See admin instructions.    . Omega-3 Fatty Acids (FISH OIL) 1200 MG CAPS Take 2,400 mg by mouth daily.     . rivaroxaban (XARELTO) 20 MG TABS tablet Take 1 tablet (20 mg total) by mouth daily with supper. 30  tablet 6  . rosuvastatin (CRESTOR) 10 MG tablet Take 10 mg by mouth daily.    . tamsulosin (FLOMAX) 0.4 MG CAPS capsule Take 0.4 mg by mouth daily.    Marland Kitchen testosterone cypionate (DEPOTESTOSTERONE CYPIONATE) 200 MG/ML injection 39ml    . triamcinolone ointment (KENALOG) 0.5 % Apply topically.     No current facility-administered medications for this encounter.    Allergies  Allergen Reactions  . Lipitor [Atorvastatin] Rash  . Metformin     Other reaction(s): diarrhea    Social History   Socioeconomic History  . Marital status: Married    Spouse name: Not on file  . Number of children: 3  . Years of education: Not on file  . Highest education level: Not on file  Occupational History    Employer: [employer]    Comment: retied sale; asphalt work   Tobacco Use  . Smoking status: Former Smoker    Packs/day: 3.00    Years: 10.00    Pack years: 30.00  Quit date: 09/19/1988    Years since quitting: 32.1  . Smokeless tobacco: Never Used  Vaping Use  . Vaping Use: Never used  Substance and Sexual Activity  . Alcohol use: Yes    Alcohol/week: 17.0 - 19.0 standard drinks    Types: 14 Shots of liquor, 3 - 5 Standard drinks or equivalent per week  . Drug use: No  . Sexual activity: Not on file  Other Topics Concern  . Not on file  Social History Narrative  . Not on file   Social Determinants of Health   Financial Resource Strain: Not on file  Food Insecurity: Not on file  Transportation Needs: Not on file  Physical Activity: Not on file  Stress: Not on file  Social Connections: Not on file  Intimate Partner Violence: Not on file     ROS- All systems are reviewed and negative except as per the HPI above.  Physical Exam: Vitals:   11/12/20 1008  BP: 130/78  Pulse: (!) 130  Weight: (!) 147.8 kg  Height: 6\' 4"  (1.93 m)    GEN- The patient is well appearing obese elderly male, alert and oriented x 3 today.   HEENT-head normocephalic, atraumatic, sclera clear,  conjunctiva pink, hearing intact, trachea midline. Lungs- Clear to ausculation bilaterally, normal work of breathing Heart- irregular rate and rhythm, tachycardia, no murmurs, rubs or gallops  GI- soft, NT, ND, + BS Extremities- no clubbing, cyanosis. Trace bilateral edema MS- no significant deformity or atrophy Skin- no rash or lesion Psych- euthymic mood, full affect Neuro- strength and sensation are intact   Wt Readings from Last 3 Encounters:  11/12/20 (!) 147.8 kg  09/03/20 (!) 145.2 kg  09/01/20 (!) 144.9 kg    EKG today demonstrates  Atypical atrial flutter Vent. rate 130 BPM QRS duration 106 ms QT/QTc 320/470 ms  TEE 09/05/18 demonstrated  - Left ventricle: The cavity size was normal. Wall thickness was  normal. Systolic function was mildly to moderately reduced. The  estimated ejection fraction was in the range of 40% to 45%.  Hypokinesis of the anteroseptal myocardium.  - Aortic valve: No evidence of vegetation.  - Mitral valve: No evidence of vegetation. There was mild  regurgitation.  - Left atrium: The atrium was dilated. No evidence of thrombus in  the appendage. No evidence of thrombus in the atrial cavity or  appendage.  - Right atrium: No evidence of thrombus in the atrial cavity or  appendage.  - Atrial septum: There was increased thickness of the septum,  consistent with lipomatous hypertrophy. No defect or patent  foramen ovale was identified.  - Tricuspid valve: No evidence of vegetation.  - Pulmonic valve: No evidence of vegetation.  - Superior vena cava: The study excluded a thrombus.   Impressions:   - Post cardioversion, EF appeared improved 45-50%   Epic records are reviewed at length today  CHA2DS2-VASc Score = 5  The patient's score is based upon: CHF History: No HTN History: Yes Diabetes History: Yes Stroke History: No Vascular Disease History: Yes Age Score: 2 Gender Score: 0      ASSESSMENT AND PLAN: 1.  Persistent Atrial Fibrillation/atrial flutter The patient's CHA2DS2-VASc score is 5, indicating a 7.2% annual risk of stroke.   We discussed therapeutic options. Will arrange DCCV. Check cmet/TSH/cbc Continue Xarelto 20 mg daily, patient denies any missed doses in the last 3 weeks.  Increase amiodarone to 200 mg BID Continue Toprol 25 mg daily We discussed the importance of lifestyle  modification including alcohol avoidance and regular exercise with weight loss. I am concerned that he will not be able to maintain SR without significant lifestyle changes.   2. Secondary Hypercoagulable State (ICD10:  D68.69) The patient is at significant risk for stroke/thromboembolism based upon his CHA2DS2-VASc Score of 5.  Continue Rivaroxaban (Xarelto).   3. Obesity Body mass index is 39.66 kg/m. Lifestyle modification was discussed and encouraged including regular physical activity and weight reduction.  4. CAD No anginal symptoms.  5. HTN Stable, no changes today.   Follow up in the AF clinic 1 week post DCCV.    [facility] [address] [place], [place] [address] [phone] 11/12/2020 10:15 AM

## 2020-11-12 NOTE — Patient Instructions (Signed)
Cardioversion scheduled for Wednesday, March 2nd  - Arrive at the [facility] and go to admitting at [facility] not eat or drink anything after midnight the night prior to your procedure.  - Take all your morning medication (except diabetic medications) with a sip of water prior to arrival.  - You will not be able to drive home after your procedure.  - Do NOT miss any doses of your blood thinner - if you should miss a dose please notify our office immediately.  - If you feel as if you go back into normal rhythm prior to scheduled cardioversion, please notify our office immediately. If your procedure is canceled in the cardioversion suite you will be charged a cancellation fee.  Increase Amiodarone to 200mg  twice a day until we see you back

## 2020-11-12 NOTE — H&P (View-Only) (Signed)
Primary Care Physician: Wenda Low, MD Primary Cardiologist: Dr Marlou Porch Primary Electrophysiologist: none Referring Physician: Dr Briscoe Burns is a 80 y.o. male with a history of DM, HTN, CAD, CLL, HLD, atrial flutter, and atrial fibrillation who presents for consultation in the [facility]. The patient was initially diagnosed with atrial flutter 2019 and underwent TEE/DCCV on 09/05/18. He was in afib on follow up and was started on amiodarone and had repeat DCCV on 10/2018. He has done well since that time until about 3 days ago when he started having symptoms of dizziness, SOB, and fatigue. His smart watch showed elevated heart rates. There were no specific triggers that he could identify. He has a CHADS2VASC score of 5 but unfortunately has only been taking Eliquis 5 mg once daily. He denies any significant snoring but does drink 4-5 alcoholic beverages daily. His anticoagulation has been changed to Xarelto. He is s/p TEE guided DCCV on 08/28/20.   On follow up today, patient reports that he was back in afib 3-4 days ago with symptoms of palpitations and dyspnea with exertion. ECG today confirms he is in atrial flutter. Patient admits he has not been very active and has gained significant weight over the last several months. He also continues to drink ~5 alcoholic drinks several nights per week.   Today, he denies symptoms of chest pain, orthopnea, PND, lower extremity edema, presyncope, syncope, snoring, daytime somnolence, bleeding, or neurologic sequela. The patient is tolerating medications without difficulties and is otherwise without complaint today.    Atrial Fibrillation Risk Factors:  he does not have symptoms or diagnosis of sleep apnea. he does not have a history of rheumatic fever. he does have a history of alcohol use. The patient does not have a history of early familial atrial fibrillation or other arrhythmias.  he has a BMI of Body  mass index is 39.66 kg/m.Marland Kitchen Filed Weights   11/12/20 1008  Weight: (!) 147.8 kg    Family History  Problem Relation Age of Onset  . Heart attack Mother   . Cancer Father        liver     Atrial Fibrillation Management history:  Previous antiarrhythmic drugs: amiodarone  Previous cardioversions: 08/2018, 10/2018, 08/28/20 Previous ablations: none CHADS2VASC score: 5 Anticoagulation history: Eliquis, Xarelto    Past Medical History:  Diagnosis Date  . Blood dyscrasia    cll remission  . 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 Diabetes mellitus, type II ([place])   . Dilated cardiomyopathy ([place]) 09/14/2018   AFlutter >> EF 40-45 pre DCCV and 45-50 post DCCV in 08/2018 // probable tachycardia mediated.  Marland Kitchen GERD (gastroesophageal reflux disease)    occ tums  . H/O cardiac catheterization   . H/O exercise stress test 1999, 2002  . Hypertension   . Hypertriglyceridemia   . Lymphocytosis   . Obesity   . Pulmonary Function Test 09/2018   PFTs 09/2018:  FEV1 88% predicted; FEV1/FVC 78%; DLCO cor 58   Past Surgical History:  Procedure Laterality Date  . CARDIOVERSION N/A 09/05/2018   Procedure: CARDIOVERSION;  Surgeon: Jerline Pain, MD;  Location: [facility] ENDOSCOPY;  Service: Cardiovascular;  Laterality: N/A;  . CARDIOVERSION N/A 10/22/2018   Procedure: CARDIOVERSION;  Surgeon: Jerline Pain, MD;  Location: [facility] ENDOSCOPY;  Service: Cardiovascular;  Laterality: N/A;  . CARDIOVERSION N/A 08/28/2020   Procedure: CARDIOVERSION;  Surgeon: Josue [name], MD;  Location: [place];  Service: Cardiovascular;  Laterality: N/A;  . CHOLECYSTECTOMY N/A 07/12/2016   Procedure: LAPAROSCOPIC CHOLECYSTECTOMY;  Surgeon: Donnie Mesa, MD;  Location: [place];  Service: General;  Laterality: N/A;  . EXTRACORPOREAL SHOCK WAVE LITHOTRIPSY Right 11/18/2019   Procedure: EXTRACORPOREAL SHOCK WAVE LITHOTRIPSY (ESWL);  Surgeon: Lucas Mallow, MD;  Location: [facility];   Service: Urology;  Laterality: Right;  . FOOT SURGERY Right   . NASAL SINUS SURGERY    . TEE WITHOUT CARDIOVERSION N/A 09/05/2018   Procedure: TRANSESOPHAGEAL ECHOCARDIOGRAM (TEE);  Surgeon: Jerline Pain, MD;  Location: [facility] ENDOSCOPY;  Service: Cardiovascular;  Laterality: N/A;  . TEE WITHOUT CARDIOVERSION N/A 08/28/2020   Procedure: TRANSESOPHAGEAL ECHOCARDIOGRAM (TEE);  Surgeon: Josue [name], MD;  Location: [facility] ENDOSCOPY;  Service: Cardiovascular;  Laterality: N/A;  . TONSILLECTOMY    . UMBILICAL HERNIA REPAIR N/A 07/12/2016   Procedure: [place];  Surgeon: Donnie Mesa, MD;  Location: [place];  Service: General;  Laterality: N/A;  . vericose vein stripping      Current Outpatient Medications  Medication Sig Dispense Refill  . acyclovir (ZOVIRAX) 400 MG tablet Take 1 tablet (400 mg total) by mouth daily. 30 tablet 3  . albuterol (VENTOLIN HFA) 108 (90 Base) MCG/ACT inhaler 1 puff as needed    . amiodarone (PACERONE) 200 MG tablet Take 1 tablet (200 mg total) by mouth daily. 90 tablet 3  . doxepin (SINEQUAN) 10 MG capsule TAKE 1 CAPSULE BY MOUTH EVERYDAY AT BEDTIME    . glimepiride (AMARYL) 2 MG tablet Take 2 mg by mouth daily.    Marland Kitchen glucose blood test strip Dispense glucometer, strips and lancets preferred by patient's insurance (DX: E11.9 / type 2 DM)    . hydrOXYzine (ATARAX/VISTARIL) 25 MG tablet Take 25 mg by mouth at bedtime.    Marland Kitchen JARDIANCE 10 MG TABS tablet TAKE 1 TABLET BY MOUTH EVERY DAY 90 mg 3  . levothyroxine (SYNTHROID) 50 MCG tablet Take 50 mcg by mouth daily before breakfast.     . metoprolol succinate (TOPROL XL) 25 MG 24 hr tablet Take 0.5 tablets (12.5 mg total) by mouth daily. 45 tablet 3  . Needle, Disp, (HYPODERMIC NEEDLE 26GX5/8") 26G X 5/8" MISC See admin instructions.    . Omega-3 Fatty Acids (FISH OIL) 1200 MG CAPS Take 2,400 mg by mouth daily.     . rivaroxaban (XARELTO) 20 MG TABS tablet Take 1 tablet (20 mg total) by mouth daily with supper. 30  tablet 6  . rosuvastatin (CRESTOR) 10 MG tablet Take 10 mg by mouth daily.    . tamsulosin (FLOMAX) 0.4 MG CAPS capsule Take 0.4 mg by mouth daily.    Marland Kitchen testosterone cypionate (DEPOTESTOSTERONE CYPIONATE) 200 MG/ML injection 61ml    . triamcinolone ointment (KENALOG) 0.5 % Apply topically.     No current facility-administered medications for this encounter.    Allergies  Allergen Reactions  . Lipitor [Atorvastatin] Rash  . Metformin     Other reaction(s): diarrhea    Social History   Socioeconomic History  . Marital status: Married    Spouse name: Not on file  . Number of children: 3  . Years of education: Not on file  . Highest education level: Not on file  Occupational History    Employer: [employer]    Comment: retied sale; asphalt work   Tobacco Use  . Smoking status: Former Smoker    Packs/day: 3.00    Years: 10.00    Pack years: 30.00  Quit date: 09/19/1988    Years since quitting: 32.1  . Smokeless tobacco: Never Used  Vaping Use  . Vaping Use: Never used  Substance and Sexual Activity  . Alcohol use: Yes    Alcohol/week: 17.0 - 19.0 standard drinks    Types: 14 Shots of liquor, 3 - 5 Standard drinks or equivalent per week  . Drug use: No  . Sexual activity: Not on file  Other Topics Concern  . Not on file  Social History Narrative  . Not on file   Social Determinants of Health   Financial Resource Strain: Not on file  Food Insecurity: Not on file  Transportation Needs: Not on file  Physical Activity: Not on file  Stress: Not on file  Social Connections: Not on file  Intimate Partner Violence: Not on file     ROS- All systems are reviewed and negative except as per the HPI above.  Physical Exam: Vitals:   11/12/20 1008  BP: 130/78  Pulse: (!) 130  Weight: (!) 147.8 kg  Height: 6\' 4"  (1.93 m)    GEN- The patient is well appearing obese elderly male, alert and oriented x 3 today.   HEENT-head normocephalic, atraumatic, sclera clear,  conjunctiva pink, hearing intact, trachea midline. Lungs- Clear to ausculation bilaterally, normal work of breathing Heart- irregular rate and rhythm, tachycardia, no murmurs, rubs or gallops  GI- soft, NT, ND, + BS Extremities- no clubbing, cyanosis. Trace bilateral edema MS- no significant deformity or atrophy Skin- no rash or lesion Psych- euthymic mood, full affect Neuro- strength and sensation are intact   Wt Readings from Last 3 Encounters:  11/12/20 (!) 147.8 kg  09/03/20 (!) 145.2 kg  09/01/20 (!) 144.9 kg    EKG today demonstrates  Atypical atrial flutter Vent. rate 130 BPM QRS duration 106 ms QT/QTc 320/470 ms  TEE 09/05/18 demonstrated  - Left ventricle: The cavity size was normal. Wall thickness was  normal. Systolic function was mildly to moderately reduced. The  estimated ejection fraction was in the range of 40% to 45%.  Hypokinesis of the anteroseptal myocardium.  - Aortic valve: No evidence of vegetation.  - Mitral valve: No evidence of vegetation. There was mild  regurgitation.  - Left atrium: The atrium was dilated. No evidence of thrombus in  the appendage. No evidence of thrombus in the atrial cavity or  appendage.  - Right atrium: No evidence of thrombus in the atrial cavity or  appendage.  - Atrial septum: There was increased thickness of the septum,  consistent with lipomatous hypertrophy. No defect or patent  foramen ovale was identified.  - Tricuspid valve: No evidence of vegetation.  - Pulmonic valve: No evidence of vegetation.  - Superior vena cava: The study excluded a thrombus.   Impressions:   - Post cardioversion, EF appeared improved 45-50%   Epic records are reviewed at length today  CHA2DS2-VASc Score = 5  The patient's score is based upon: CHF History: No HTN History: Yes Diabetes History: Yes Stroke History: No Vascular Disease History: Yes Age Score: 2 Gender Score: 0      ASSESSMENT AND PLAN: 1.  Persistent Atrial Fibrillation/atrial flutter The patient's CHA2DS2-VASc score is 5, indicating a 7.2% annual risk of stroke.   We discussed therapeutic options. Will arrange DCCV. Check cmet/TSH/cbc Continue Xarelto 20 mg daily, patient denies any missed doses in the last 3 weeks.  Increase amiodarone to 200 mg BID Continue Toprol 25 mg daily We discussed the importance of lifestyle  modification including alcohol avoidance and regular exercise with weight loss. I am concerned that he will not be able to maintain SR without significant lifestyle changes.   2. Secondary Hypercoagulable State (ICD10:  D68.69) The patient is at significant risk for stroke/thromboembolism based upon his CHA2DS2-VASc Score of 5.  Continue Rivaroxaban (Xarelto).   3. Obesity Body mass index is 39.66 kg/m. Lifestyle modification was discussed and encouraged including regular physical activity and weight reduction.  4. CAD No anginal symptoms.  5. HTN Stable, no changes today.   Follow up in the AF clinic 1 week post DCCV.    [facility] [address] [place], [place] [address] [phone] 11/12/2020 10:15 AM

## 2020-11-13 ENCOUNTER — Other Ambulatory Visit (HOSPITAL_COMMUNITY): Payer: Self-pay | Admitting: *Deleted

## 2020-11-13 LAB — T3, FREE: T3, Free: 2.9 pg/mL (ref 2.0–4.4)

## 2020-11-13 MED ORDER — AMIODARONE HCL 200 MG PO TABS: ORAL_TABLET | ORAL | 0 refills | Status: DC

## 2020-11-16 ENCOUNTER — Other Ambulatory Visit (HOSPITAL_COMMUNITY)
Admission: RE | Admit: 2020-11-16 | Discharge: 2020-11-16 | Disposition: A | Discharge status: Home / Self Care | Payer: Medicare Other | Source: Ambulatory Visit | Attending: Internal Medicine | Admitting: Internal Medicine

## 2020-11-16 DIAGNOSIS — Z01812 Encounter for preprocedural laboratory examination: Secondary | ICD-10-CM | POA: Diagnosis not present

## 2020-11-16 DIAGNOSIS — U071 COVID-19: Secondary | ICD-10-CM | POA: Insufficient documentation

## 2020-11-17 ENCOUNTER — Telehealth: Payer: Self-pay | Admitting: Cardiology

## 2020-11-17 ENCOUNTER — Telehealth: Payer: Self-pay | Admitting: Hematology and Oncology

## 2020-11-17 ENCOUNTER — Other Ambulatory Visit (HOSPITAL_COMMUNITY): Payer: Self-pay | Admitting: *Deleted

## 2020-11-17 LAB — SARS CORONAVIRUS 2 (TAT 6-24 HRS): SARS Coronavirus 2: POSITIVE — AB

## 2020-11-17 NOTE — Telephone Encounter (Signed)
Will forward call to A-fib clinic as it looks like PAC ordered DCCV.

## 2020-11-17 NOTE — Progress Notes (Signed)
Called Selena from the office of Dr. Osborne Oman with + covid results for this pt. The pt is to have surgery on Wed 11/18/20 at [facility] at 1200. The pt has not been notified as there will be pertinent questions the provider needs to answer.   Positive Results for:  Asymptomatic: Procedure postponed and quarantined for 10 days, unless the procedure is urgent. Please follow the facility's protocol regarding pt and family arrival.  Symptomatic: Procedure postponed and quarantined for 14 days.  Hospitalized with Covid: postponed and quarantined  for 21 days.  Immunocompromised: Procedure postponed and quarantine for 20 days.  The pt will not be retested for 90 days from the + result.

## 2020-11-17 NOTE — Telephone Encounter (Signed)
Scheduled appt per 3/1 sch msg - left message for patient with appt date and time   

## 2020-11-17 NOTE — Telephone Encounter (Signed)
Derek [name] from [facility] testing site states the patient tested positive for covid.

## 2020-11-17 NOTE — Telephone Encounter (Signed)
Cardioversion rescheduled for 3/14. Preop instructions reviewed and pt verbalized understanding to arrive at 930am at entrance A. NPO after MN. No missed doses of anticoagulation. Follow up appt adjusted as well.  IF patient should develop worsening symptoms of covid pt will contact me back.

## 2020-11-17 NOTE — Telephone Encounter (Signed)
Discussed with patient. He does have cold like symptoms. Instructed we will have to postpone cardioversion for at minimum 10 days. Pt to call PCP if issues arise covid related. Quarantine and household contacts reviewed.

## 2020-11-18 ENCOUNTER — Telehealth: Payer: Self-pay

## 2020-11-18 ENCOUNTER — Ambulatory Visit (HOSPITAL_COMMUNITY)
Admission: RE | Admit: 2020-11-18 | Discharge: 2020-11-18 | Disposition: A | Discharge status: Home / Self Care | Payer: Medicare Other | Source: Ambulatory Visit | Attending: Pulmonary Disease | Admitting: Pulmonary Disease

## 2020-11-18 ENCOUNTER — Other Ambulatory Visit: Payer: Self-pay | Admitting: Adult Health

## 2020-11-18 DIAGNOSIS — U071 COVID-19: Secondary | ICD-10-CM

## 2020-11-18 MED ORDER — FAMOTIDINE IN NACL 20-0.9 MG/50ML-% IV SOLN: 20.0000 mg | Freq: Once | INTRAVENOUS | Status: DC | PRN

## 2020-11-18 MED ORDER — EPINEPHRINE 0.3 MG/0.3ML IJ SOAJ: 0.3000 mg | Freq: Once | INTRAMUSCULAR | Status: DC | PRN

## 2020-11-18 MED ORDER — SODIUM CHLORIDE 0.9 % IV SOLN: INTRAVENOUS | Status: DC | PRN

## 2020-11-18 MED ORDER — METHYLPREDNISOLONE SODIUM SUCC 125 MG IJ SOLR: 125.0000 mg | Freq: Once | INTRAMUSCULAR | Status: DC | PRN

## 2020-11-18 MED ORDER — SOTROVIMAB 500 MG/8ML IV SOLN
500.0000 mg | Freq: Once | INTRAVENOUS | Status: AC
  Administered 2020-11-18: 500 mg via INTRAVENOUS

## 2020-11-18 MED ORDER — ALBUTEROL SULFATE HFA 108 (90 BASE) MCG/ACT IN AERS: 2.0000 | INHALATION_SPRAY | Freq: Once | RESPIRATORY_TRACT | Status: DC | PRN

## 2020-11-18 MED ORDER — DIPHENHYDRAMINE HCL 50 MG/ML IJ SOLN: 50.0000 mg | Freq: Once | INTRAMUSCULAR | Status: DC | PRN

## 2020-11-18 NOTE — Progress Notes (Signed)
Informed Nell Range, PA about patient's uncontrolled afib, heart rate in the 120s. Pt stated he was schedule for cardioversion today but cancelled d/t COVID and rescheduled for the 14th.   Pt denies SOB, dizziness and c/o fatigue at this time, but is COVID positive. Per K. Grandville Silos, informed pt that he can take whole tablet of Toprol XL for rate control but if heart rate continues in the 120s-130s, or if patient has symptoms, to call the [facility]. Pt understands without assistance.

## 2020-11-18 NOTE — Progress Notes (Signed)
I connected by phone with Derek [name] on 11/18/2020 at 12:09 PM to discuss the potential use of a new treatment for mild to moderate COVID-19 viral infection in non-hospitalized patients.  This patient is a 80 y.o. male that meets the FDA criteria for Emergency Use Authorization of COVID monoclonal antibody sotrovimab.  Has a (+) direct SARS-CoV-2 viral test result  Has mild or moderate COVID-19   Is NOT hospitalized due to COVID-19  Is within 10 days of symptom onset  Has at least one of the high risk factor(s) for progression to severe COVID-19 and/or hospitalization as defined in EUA.  Specific high risk criteria : Older age (>/= 79 yo), Immunosuppressive Disease or Treatment and Cardiovascular disease or hypertension   I have spoken and communicated the following to the patient or parent/caregiver regarding COVID monoclonal antibody treatment:  1. FDA has authorized the emergency use for the treatment of mild to moderate COVID-19 in adults and pediatric patients with positive results of direct SARS-CoV-2 viral testing who are 71 years of age and older weighing at least 40 kg, and who are at high risk for progressing to severe COVID-19 and/or hospitalization.  2. The significant known and potential risks and benefits of COVID monoclonal antibody, and the extent to which such potential risks and benefits are unknown.  3. Information on available alternative treatments and the risks and benefits of those alternatives, including clinical trials.  4. Patients treated with COVID monoclonal antibody should continue to self-isolate and use infection control measures (e.g., wear mask, isolate, social distance, avoid sharing personal items, clean and disinfect "high touch" surfaces, and frequent handwashing) according to CDC guidelines.   5. The patient or parent/caregiver has the option to accept or refuse COVID monoclonal antibody treatment.  After reviewing this information with the patient,  the patient has agreed to receive one of the available covid 19 monoclonal antibodies and will be provided an appropriate fact sheet prior to infusion. Scot Dock, NP 11/18/2020 12:09 PM

## 2020-11-18 NOTE — Telephone Encounter (Signed)
Called to discuss with patient about COVID-19 symptoms and the use of one of the available treatments for those with mild to moderate Covid symptoms and at a high risk of hospitalization.  Pt appears to qualify for outpatient treatment due to co-morbid conditions and/or a member of an at-risk group in accordance with the FDA Emergency Use Authorization.    Symptom onset: Runny nose 11/13/20   Vaccinated: Yes Booster? Yes Immunocompromised? Yes Qualifiers: HTN,CAD,DM, Atrial fib/flutter, History of CLL  Pt. Would like to speak with APP.  Derek [name]

## 2020-11-18 NOTE — Discharge Instructions (Signed)

## 2020-11-18 NOTE — Progress Notes (Signed)
Patient reviewed Fact Sheet for Patients, Parents, and Caregivers for Emergency Use Authorization (EUA) of sotrovimab for the Treatment of Coronavirus. Patient also reviewed and is agreeable to the estimated cost of treatment. Patient is agreeable to proceed.   

## 2020-11-18 NOTE — Progress Notes (Signed)
Diagnosis: COVID-19  Physician: Dr. Patrick Wright  Procedure: Covid Infusion Clinic Med: Sotrovimab infusion - Provided patient with sotrovimab fact sheet for patients, parents, and caregivers prior to infusion.   Complications: No immediate complications noted  Discharge: Discharged home    

## 2020-11-24 ENCOUNTER — Other Ambulatory Visit: Payer: Medicare Other

## 2020-11-24 ENCOUNTER — Ambulatory Visit: Payer: Medicare Other

## 2020-11-24 ENCOUNTER — Ambulatory Visit: Payer: Medicare Other | Admitting: Hematology and Oncology

## 2020-11-26 ENCOUNTER — Ambulatory Visit (HOSPITAL_COMMUNITY): Payer: Medicare Other | Admitting: Physician Assistant

## 2020-11-30 ENCOUNTER — Encounter (HOSPITAL_COMMUNITY)
Admission: RE | Disposition: A | Discharge status: Home / Self Care | Payer: Self-pay | Source: Home / Self Care | Attending: Cardiology

## 2020-11-30 ENCOUNTER — Ambulatory Visit (HOSPITAL_COMMUNITY)
Admission: RE | Admit: 2020-11-30 | Discharge: 2020-11-30 | Disposition: A | Discharge status: Home / Self Care | Payer: Medicare Other | Attending: Cardiology | Admitting: Cardiology

## 2020-11-30 ENCOUNTER — Ambulatory Visit (HOSPITAL_COMMUNITY): Payer: Medicare Other | Admitting: Anesthesiology

## 2020-11-30 ENCOUNTER — Encounter (HOSPITAL_COMMUNITY): Payer: Self-pay | Admitting: Cardiology

## 2020-11-30 ENCOUNTER — Other Ambulatory Visit: Payer: Self-pay

## 2020-11-30 DIAGNOSIS — I2510 Atherosclerotic heart disease of native coronary artery without angina pectoris: Secondary | ICD-10-CM | POA: Diagnosis not present

## 2020-11-30 DIAGNOSIS — Z7901 Long term (current) use of anticoagulants: Secondary | ICD-10-CM | POA: Insufficient documentation

## 2020-11-30 DIAGNOSIS — Z79890 Hormone replacement therapy (postmenopausal): Secondary | ICD-10-CM | POA: Diagnosis not present

## 2020-11-30 DIAGNOSIS — E669 Obesity, unspecified: Secondary | ICD-10-CM | POA: Insufficient documentation

## 2020-11-30 DIAGNOSIS — I10 Essential (primary) hypertension: Secondary | ICD-10-CM | POA: Diagnosis not present

## 2020-11-30 DIAGNOSIS — I4819 Other persistent atrial fibrillation: Secondary | ICD-10-CM | POA: Diagnosis not present

## 2020-11-30 DIAGNOSIS — I4892 Unspecified atrial flutter: Secondary | ICD-10-CM | POA: Diagnosis not present

## 2020-11-30 DIAGNOSIS — Z6839 Body mass index (BMI) 39.0-39.9, adult: Secondary | ICD-10-CM | POA: Insufficient documentation

## 2020-11-30 DIAGNOSIS — Z87891 Personal history of nicotine dependence: Secondary | ICD-10-CM | POA: Diagnosis not present

## 2020-11-30 DIAGNOSIS — Z7984 Long term (current) use of oral hypoglycemic drugs: Secondary | ICD-10-CM | POA: Diagnosis not present

## 2020-11-30 DIAGNOSIS — D6869 Other thrombophilia: Secondary | ICD-10-CM | POA: Insufficient documentation

## 2020-11-30 DIAGNOSIS — I480 Paroxysmal atrial fibrillation: Secondary | ICD-10-CM | POA: Diagnosis not present

## 2020-11-30 DIAGNOSIS — D696 Thrombocytopenia, unspecified: Secondary | ICD-10-CM | POA: Diagnosis not present

## 2020-11-30 DIAGNOSIS — Z79899 Other long term (current) drug therapy: Secondary | ICD-10-CM | POA: Diagnosis not present

## 2020-11-30 DIAGNOSIS — E785 Hyperlipidemia, unspecified: Secondary | ICD-10-CM | POA: Diagnosis not present

## 2020-11-30 DIAGNOSIS — K219 Gastro-esophageal reflux disease without esophagitis: Secondary | ICD-10-CM | POA: Diagnosis not present

## 2020-11-30 HISTORY — PX: CARDIOVERSION: SHX1299

## 2020-11-30 LAB — GLUCOSE, CAPILLARY: Glucose-Capillary: 160 mg/dL — ABNORMAL HIGH (ref 70–99)

## 2020-11-30 SURGERY — CARDIOVERSION
Anesthesia: General

## 2020-11-30 MED ORDER — SODIUM CHLORIDE 0.9 % IV SOLN: Freq: Once | INTRAVENOUS | Status: AC

## 2020-11-30 MED ORDER — LIDOCAINE HCL (CARDIAC) PF 100 MG/5ML IV SOSY
PREFILLED_SYRINGE | INTRAVENOUS | Status: DC | PRN
  Administered 2020-11-30: 100 mg via INTRATRACHEAL

## 2020-11-30 MED ORDER — SODIUM CHLORIDE 0.9 % IV SOLN: INTRAVENOUS | Status: DC | PRN

## 2020-11-30 MED ORDER — PROPOFOL 10 MG/ML IV BOLUS
INTRAVENOUS | Status: DC | PRN
  Administered 2020-11-30: 120 mg via INTRAVENOUS

## 2020-11-30 NOTE — Discharge Instructions (Signed)

## 2020-11-30 NOTE — Transfer of Care (Signed)
Immediate Anesthesia Transfer of Care Note  Patient: Derek [name]  Procedure(s) Performed: CARDIOVERSION (N/A )  Patient Location: Endoscopy Unit  Anesthesia Type:General  Level of Consciousness: sedated  Airway & Oxygen Therapy: Patient connected to nasal cannula oxygen  Post-op Assessment: Post -op Vital signs reviewed and stable  Post vital signs: stable  Last Vitals:  Vitals Value Taken Time  BP    Temp    Pulse    Resp    SpO2      Last Pain:  Vitals:   11/30/20 0956  TempSrc: Oral  PainSc: 0-No pain         Complications: No complications documented.

## 2020-11-30 NOTE — Anesthesia Preprocedure Evaluation (Addendum)
Anesthesia Evaluation  Patient identified by MRN, date of birth, ID band Patient awake    Reviewed: Allergy & Precautions, NPO status , Patient's Chart, lab work & pertinent test results  History of Anesthesia Complications Negative for: history of anesthetic complications  Airway Mallampati: III  TM Distance: >3 FB Neck ROM: Full    Dental  (+) Edentulous Lower, Edentulous Upper   Pulmonary former smoker,    Pulmonary exam normal        Cardiovascular hypertension, + CAD  Normal cardiovascular exam+ dysrhythmias Atrial Fibrillation   TTE 08/2020: EF 45-50%, global hypokinesis, mild LAE, moderate MR     Neuro/Psych negative neurological ROS  negative psych ROS   GI/Hepatic Neg liver ROS, GERD  ,  Endo/Other  diabetes, Type 2, Oral Hypoglycemic AgentsHypothyroidism Morbid obesity  Renal/GU Renal InsufficiencyRenal disease  negative genitourinary   Musculoskeletal negative musculoskeletal ROS (+)   Abdominal   Peds  Hematology negative hematology ROS (+)   Anesthesia Other Findings Day of surgery medications reviewed with patient.  Reproductive/Obstetrics negative OB ROS                           Anesthesia Physical Anesthesia Plan  ASA: III  Anesthesia Plan: General   Post-op Pain Management:    Induction: Intravenous  PONV Risk Score and Plan: Treatment may vary due to age or medical condition and Propofol infusion  Airway Management Planned: Mask  Additional Equipment: None  Intra-op Plan:   Post-operative Plan:   Informed Consent: I have reviewed the patients History and Physical, chart, labs and discussed the procedure including the risks, benefits and alternatives for the proposed anesthesia with the patient or authorized representative who has indicated his/her understanding and acceptance.       Plan Discussed with: CRNA  Anesthesia Plan Comments:         Anesthesia Quick Evaluation

## 2020-11-30 NOTE — Anesthesia Postprocedure Evaluation (Signed)
Anesthesia Post Note  Patient: NIEKO CLARIN  Procedure(s) Performed: CARDIOVERSION (N/A )     Patient location during evaluation: PACU Anesthesia Type: General Level of consciousness: awake and alert and oriented Pain management: pain level controlled Vital Signs Assessment: post-procedure vital signs reviewed and stable Respiratory status: spontaneous breathing, nonlabored ventilation and respiratory function stable Cardiovascular status: blood pressure returned to baseline Postop Assessment: no apparent nausea or vomiting Anesthetic complications: no   No complications documented.  Last Vitals:  Vitals:   11/30/20 1035 11/30/20 1053  BP: 130/69 139/73  Pulse: 64 64  Resp: 19 18  Temp: (!) 36.3 C   SpO2: 98% 97%    Last Pain:  Vitals:   11/30/20 1053  TempSrc:   PainSc: 0-No pain                 Brennan Bailey

## 2020-11-30 NOTE — Interval H&P Note (Signed)
History and Physical Interval Note:  11/30/2020 9:49 AM  Derek [name]  has presented today for surgery, with the diagnosis of AFIB.  The various methods of treatment have been discussed with the patient and family. After consideration of risks, benefits and other options for treatment, the patient has consented to  Procedure(s): CARDIOVERSION (N/A) as a surgical intervention.  The patient's history has been reviewed, patient examined, no change in status, stable for surgery.  I have reviewed the patient's chart and labs.  Questions were answered to the patient's satisfaction.     [employer]

## 2020-11-30 NOTE — CV Procedure (Signed)
    Electrical Cardioversion Procedure Note Derek [name] [id number] [birth year]  Procedure: Electrical Cardioversion Indications:  Atrial Fibrillation  Time Out: Verified patient identification, verified procedure,medications/allergies/relevent history reviewed, required imaging and test results available.  Performed  Procedure Details  The patient was NPO after midnight. Anesthesia was administered at the beside with propofol.  Cardioversion was performed with synchronized biphasic defibrillation via AP pads with 200 joules.  1 attempt(s) were performed.  The patient converted to normal sinus rhythm. The patient tolerated the procedure well   IMPRESSION:  Successful cardioversion of atrial fibrillation    Derek [name] 11/30/2020, 10:30 AM

## 2020-12-01 ENCOUNTER — Encounter (HOSPITAL_COMMUNITY): Payer: Self-pay | Admitting: Cardiology

## 2020-12-05 ENCOUNTER — Other Ambulatory Visit (HOSPITAL_COMMUNITY): Payer: Self-pay | Admitting: Physician Assistant

## 2020-12-08 ENCOUNTER — Other Ambulatory Visit: Payer: Self-pay

## 2020-12-08 ENCOUNTER — Encounter (HOSPITAL_COMMUNITY): Payer: Self-pay | Admitting: Physician Assistant

## 2020-12-08 ENCOUNTER — Ambulatory Visit (HOSPITAL_COMMUNITY)
Admission: RE | Admit: 2020-12-08 | Discharge: 2020-12-08 | Disposition: A | Discharge status: Home / Self Care | Payer: Medicare Other | Source: Ambulatory Visit | Attending: Physician Assistant | Admitting: Physician Assistant

## 2020-12-08 VITALS — BP 152/70 | HR 47 | Ht 76.0 in | Wt 323.6 lb

## 2020-12-08 DIAGNOSIS — Z6839 Body mass index (BMI) 39.0-39.9, adult: Secondary | ICD-10-CM | POA: Insufficient documentation

## 2020-12-08 DIAGNOSIS — D6869 Other thrombophilia: Secondary | ICD-10-CM

## 2020-12-08 DIAGNOSIS — C9110 Chronic lymphocytic leukemia of B-cell type not having achieved remission: Secondary | ICD-10-CM | POA: Insufficient documentation

## 2020-12-08 DIAGNOSIS — Z7984 Long term (current) use of oral hypoglycemic drugs: Secondary | ICD-10-CM | POA: Diagnosis not present

## 2020-12-08 DIAGNOSIS — Z8249 Family history of ischemic heart disease and other diseases of the circulatory system: Secondary | ICD-10-CM | POA: Diagnosis not present

## 2020-12-08 DIAGNOSIS — I480 Paroxysmal atrial fibrillation: Secondary | ICD-10-CM

## 2020-12-08 DIAGNOSIS — I10 Essential (primary) hypertension: Secondary | ICD-10-CM | POA: Insufficient documentation

## 2020-12-08 DIAGNOSIS — I2510 Atherosclerotic heart disease of native coronary artery without angina pectoris: Secondary | ICD-10-CM | POA: Insufficient documentation

## 2020-12-08 DIAGNOSIS — R0683 Snoring: Secondary | ICD-10-CM | POA: Insufficient documentation

## 2020-12-08 DIAGNOSIS — Z7901 Long term (current) use of anticoagulants: Secondary | ICD-10-CM | POA: Diagnosis not present

## 2020-12-08 DIAGNOSIS — R5383 Other fatigue: Secondary | ICD-10-CM | POA: Insufficient documentation

## 2020-12-08 DIAGNOSIS — E669 Obesity, unspecified: Secondary | ICD-10-CM | POA: Insufficient documentation

## 2020-12-08 DIAGNOSIS — E785 Hyperlipidemia, unspecified: Secondary | ICD-10-CM | POA: Insufficient documentation

## 2020-12-08 DIAGNOSIS — Z79890 Hormone replacement therapy (postmenopausal): Secondary | ICD-10-CM | POA: Insufficient documentation

## 2020-12-08 DIAGNOSIS — Z8616 Personal history of COVID-19: Secondary | ICD-10-CM | POA: Insufficient documentation

## 2020-12-08 DIAGNOSIS — Z87891 Personal history of nicotine dependence: Secondary | ICD-10-CM | POA: Diagnosis not present

## 2020-12-08 DIAGNOSIS — I4819 Other persistent atrial fibrillation: Secondary | ICD-10-CM | POA: Diagnosis not present

## 2020-12-08 DIAGNOSIS — Z79899 Other long term (current) drug therapy: Secondary | ICD-10-CM | POA: Diagnosis not present

## 2020-12-08 DIAGNOSIS — E119 Type 2 diabetes mellitus without complications: Secondary | ICD-10-CM | POA: Insufficient documentation

## 2020-12-08 MED ORDER — RIVAROXABAN 20 MG PO TABS: 20.0000 mg | ORAL_TABLET | Freq: Every day | ORAL | 6 refills | Status: DC

## 2020-12-08 MED ORDER — AMIODARONE HCL 200 MG PO TABS: 200.0000 mg | ORAL_TABLET | Freq: Every day | ORAL | 6 refills | Status: DC

## 2020-12-08 NOTE — Patient Instructions (Signed)
Stop metoprolol (toprol)

## 2020-12-08 NOTE — Progress Notes (Signed)
Primary Care Physician: Wenda Low, MD Primary Cardiologist: Dr Marlou Porch Primary Electrophysiologist: none Referring Physician: Dr Briscoe Burns is a 80 y.o. male with a history of DM, HTN, CAD, CLL, HLD, atrial flutter, and atrial fibrillation who presents for consultation in the [facility]. The patient was initially diagnosed with atrial flutter 2019 and underwent TEE/DCCV on 09/05/18. He was in afib on follow up and was started on amiodarone and had repeat DCCV on 10/2018. He started having symptoms of dizziness, SOB, and fatigue. His smart watch showed elevated heart rates. There were no specific triggers that he could identify. He has a CHADS2VASC score of 5 but unfortunately has only been taking Eliquis 5 mg once daily. His anticoagulation has been changed to Xarelto. He is s/p TEE guided DCCV on 08/28/20.   On follow up today, patient is s/p DCCV on 11/30/20. Of note, he tested positive for COVID on 11/17/20. He remains in SR but states that he has been more fatigued recently. His heart rates have been in the 50s. He does admit to snoring and daytime somnolence. He denies any bleeding issues on anticoagulation.   Today, he denies symptoms of palpitations, chest pain, orthopnea, PND, lower extremity edema, presyncope, syncope, bleeding, or neurologic sequela. The patient is tolerating medications without difficulties and is otherwise without complaint today.    Atrial Fibrillation Risk Factors:  he does not have symptoms or diagnosis of sleep apnea. he does not have a history of rheumatic fever. he does have a history of alcohol use. The patient does not have a history of early familial atrial fibrillation or other arrhythmias.  he has a BMI of Body mass index is 39.39 kg/m.Marland Kitchen Filed Weights   12/08/20 1151  Weight: (!) 146.8 kg    Family History  Problem Relation Age of Onset  . Heart attack Mother   . Cancer Father        liver      Atrial Fibrillation Management history:  Previous antiarrhythmic drugs: amiodarone  Previous cardioversions: 08/2018, 10/2018, 08/28/20, 11/30/20 Previous ablations: none CHADS2VASC score: 5 Anticoagulation history: Eliquis, Xarelto    Past Medical History:  Diagnosis Date  . Blood dyscrasia    cll remission  . 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 Diabetes mellitus, type II ([place])   . Dilated cardiomyopathy ([place]) 09/14/2018   AFlutter >> EF 40-45 pre DCCV and 45-50 post DCCV in 08/2018 // probable tachycardia mediated.  Marland Kitchen GERD (gastroesophageal reflux disease)    occ tums  . H/O cardiac catheterization   . H/O exercise stress test 1999, 2002  . Hypertension   . Hypertriglyceridemia   . Lymphocytosis   . Obesity   . Pulmonary Function Test 09/2018   PFTs 09/2018:  FEV1 88% predicted; FEV1/FVC 78%; DLCO cor 58   Past Surgical History:  Procedure Laterality Date  . CARDIOVERSION N/A 09/05/2018   Procedure: CARDIOVERSION;  Surgeon: Jerline Pain, MD;  Location: [place];  Service: Cardiovascular;  Laterality: N/A;  . CARDIOVERSION N/A 10/22/2018   Procedure: CARDIOVERSION;  Surgeon: Jerline Pain, MD;  Location: [facility] ENDOSCOPY;  Service: Cardiovascular;  Laterality: N/A;  . CARDIOVERSION N/A 08/28/2020   Procedure: CARDIOVERSION;  Surgeon: Josue [name], MD;  Location: [place];  Service: Cardiovascular;  Laterality: N/A;  . CARDIOVERSION N/A 11/30/2020   Procedure: CARDIOVERSION;  Surgeon: Jerline Pain, MD;  Location: [facility] ENDOSCOPY;  Service: Cardiovascular;  Laterality: N/A;  . CHOLECYSTECTOMY N/A 07/12/2016  Procedure: LAPAROSCOPIC CHOLECYSTECTOMY;  Surgeon: Donnie Mesa, MD;  Location: [place];  Service: General;  Laterality: N/A;  . EXTRACORPOREAL SHOCK WAVE LITHOTRIPSY Right 11/18/2019   Procedure: EXTRACORPOREAL SHOCK WAVE LITHOTRIPSY (ESWL);  Surgeon: Lucas Mallow, MD;  Location: [facility];  Service: Urology;  Laterality:  Right;  . FOOT SURGERY Right   . NASAL SINUS SURGERY    . TEE WITHOUT CARDIOVERSION N/A 09/05/2018   Procedure: TRANSESOPHAGEAL ECHOCARDIOGRAM (TEE);  Surgeon: Jerline Pain, MD;  Location: [facility] ENDOSCOPY;  Service: Cardiovascular;  Laterality: N/A;  . TEE WITHOUT CARDIOVERSION N/A 08/28/2020   Procedure: TRANSESOPHAGEAL ECHOCARDIOGRAM (TEE);  Surgeon: Josue [name], MD;  Location: [facility] ENDOSCOPY;  Service: Cardiovascular;  Laterality: N/A;  . TONSILLECTOMY    . UMBILICAL HERNIA REPAIR N/A 07/12/2016   Procedure: [place];  Surgeon: Donnie Mesa, MD;  Location: [place];  Service: General;  Laterality: N/A;  . vericose vein stripping      Current Outpatient Medications  Medication Sig Dispense Refill  . allopurinol (ZYLOPRIM) 300 MG tablet Take 300 mg by mouth daily.    Marland Kitchen amiodarone (PACERONE) 200 MG tablet TAKE 1 TABLET BY MOUTH TWICE A DAY FOR 2 WEEKS THEN REDUCE TO 1 TABLET DAILY. 60 tablet 0  . doxepin (SINEQUAN) 10 MG capsule Take 10 mg by mouth at bedtime.    Marland Kitchen glimepiride (AMARYL) 2 MG tablet Take 2 mg by mouth daily with breakfast.    . levothyroxine (SYNTHROID) 25 MCG tablet Take 25 mcg by mouth daily before breakfast.    . metoprolol succinate (TOPROL XL) 25 MG 24 hr tablet Take 0.5 tablets (12.5 mg total) by mouth daily. 45 tablet 3  . Omega-3 Fatty Acids (FISH OIL PO) Take 2 capsules by mouth daily.    . rivaroxaban (XARELTO) 20 MG TABS tablet Take 1 tablet (20 mg total) by mouth daily with supper. 30 tablet 6  . rosuvastatin (CRESTOR) 10 MG tablet Take 10 mg by mouth daily.    . tamsulosin (FLOMAX) 0.4 MG CAPS capsule Take 0.4 mg by mouth daily.    Marland Kitchen acyclovir (ZOVIRAX) 400 MG tablet Take 1 tablet (400 mg total) by mouth daily. (Patient not taking: Reported on 12/08/2020) 30 tablet 3  . glucose blood test strip Dispense glucometer, strips and lancets preferred by patient's insurance (DX: E11.9 / type 2 DM)    . JARDIANCE 10 MG TABS tablet TAKE 1 TABLET BY MOUTH  EVERY DAY (Patient not taking: No sig reported) 90 mg 3  . Needle, Disp, (HYPODERMIC NEEDLE 26GX5/8") 26G X 5/8" MISC See admin instructions.     No current facility-administered medications for this encounter.    Allergies  Allergen Reactions  . Lipitor [Atorvastatin] Rash  . Metformin Diarrhea    Social History   Socioeconomic History  . Marital status: Married    Spouse name: Not on file  . Number of children: 3  . Years of education: Not on file  . Highest education level: Not on file  Occupational History    Employer: [employer]    Comment: retied sale; asphalt work   Tobacco Use  . Smoking status: Former Smoker    Packs/day: 3.00    Years: 10.00    Pack years: 30.00    Quit date: 09/19/1988    Years since quitting: 32.2  . Smokeless tobacco: Never Used  Vaping Use  . Vaping Use: Never used  Substance and Sexual Activity  . Alcohol use: Yes  Alcohol/week: 17.0 - 19.0 standard drinks    Types: 14 Shots of liquor, 3 - 5 Standard drinks or equivalent per week  . Drug use: No  . Sexual activity: Not on file  Other Topics Concern  . Not on file  Social History Narrative  . Not on file   Social Determinants of Health   Financial Resource Strain: Not on file  Food Insecurity: Not on file  Transportation Needs: Not on file  Physical Activity: Not on file  Stress: Not on file  Social Connections: Not on file  Intimate Partner Violence: Not on file     ROS- All systems are reviewed and negative except as per the HPI above.  Physical Exam: Vitals:   12/08/20 1151  BP: (!) 152/70  Pulse: (!) 47  Weight: (!) 146.8 kg  Height: 6\' 4"  (1.93 m)    GEN- The patient is a well appearing obese elderly male, alert and oriented x 3 today.   HEENT-head normocephalic, atraumatic, sclera clear, conjunctiva pink, hearing intact, trachea midline. Lungs- Clear to ausculation bilaterally, normal work of breathing Heart- Regular rate and rhythm, bradycardia, no murmurs,  rubs or gallops  GI- soft, NT, ND, + BS Extremities- no clubbing, cyanosis, or edema MS- no significant deformity or atrophy Skin- no rash or lesion Psych- euthymic mood, full affect Neuro- strength and sensation are intact   Wt Readings from Last 3 Encounters:  12/08/20 (!) 146.8 kg  11/30/20 (!) 147.4 kg  11/12/20 (!) 147.8 kg    EKG today demonstrates  SB, 1st degree AV block Vent. rate 47 BPM PR interval 272 ms QRS duration 120 ms QT/QTc 486/430 ms  TEE 09/05/18 demonstrated  - Left ventricle: The cavity size was normal. Wall thickness was  normal. Systolic function was mildly to moderately reduced. The  estimated ejection fraction was in the range of 40% to 45%.  Hypokinesis of the anteroseptal myocardium.  - Aortic valve: No evidence of vegetation.  - Mitral valve: No evidence of vegetation. There was mild  regurgitation.  - Left atrium: The atrium was dilated. No evidence of thrombus in  the appendage. No evidence of thrombus in the atrial cavity or  appendage.  - Right atrium: No evidence of thrombus in the atrial cavity or  appendage.  - Atrial septum: There was increased thickness of the septum,  consistent with lipomatous hypertrophy. No defect or patent  foramen ovale was identified.  - Tricuspid valve: No evidence of vegetation.  - Pulmonic valve: No evidence of vegetation.  - Superior vena cava: The study excluded a thrombus.   Impressions:   - Post cardioversion, EF appeared improved 45-50%   Epic records are reviewed at length today  CHA2DS2-VASc Score = 5  The patient's score is based upon: CHF History: No HTN History: Yes Diabetes History: Yes Stroke History: No Vascular Disease History: Yes Age Score: 2 Gender Score: 0      ASSESSMENT AND PLAN: 1. Persistent Atrial Fibrillation/atrial flutter The patient's CHA2DS2-VASc score is 5, indicating a 7.2% annual risk of stroke.   S/p DCCV on 11/30/20 Patient in SB today.   Continue Xarelto 20 mg daily Continue amiodarone 200 mg daily Stop Toprol given bradycardia and fatigue.  Patient has cut back on his alcohol intake to ~2 beers every few days. Encouraged him to continue to decrease intake. Check echocardiogram     2. Secondary Hypercoagulable State (ICD10:  D68.69) The patient is at significant risk for stroke/thromboembolism based upon his CHA2DS2-VASc Score  of 5.  Continue Rivaroxaban (Xarelto).   3. Obesity Body mass index is 39.39 kg/m. Lifestyle modification was discussed and encouraged including regular physical activity and weight reduction.  4. CAD No anginal symptoms.  5. HTN Stable, no changes today.  6. Snoring/daytime somnolence The importance of adequate treatment of sleep apnea was discussed today in order to improve our ability to maintain sinus rhythm long term. Will refer for sleep study.  7. Fatigue Check echo as above. TSH mildly elevated but free T4 and T3 normal. Stop BB as above.    Follow up in the AF clinic in one month.    [facility] [address] [place], [place] [address] [phone] 12/08/2020 12:04 PM

## 2020-12-09 ENCOUNTER — Inpatient Hospital Stay (HOSPITAL_BASED_OUTPATIENT_CLINIC_OR_DEPARTMENT_OTHER): Payer: Medicare Other | Admitting: Hematology and Oncology

## 2020-12-09 ENCOUNTER — Inpatient Hospital Stay: Payer: Medicare Other

## 2020-12-09 ENCOUNTER — Encounter: Payer: Self-pay | Admitting: Hematology and Oncology

## 2020-12-09 ENCOUNTER — Other Ambulatory Visit: Payer: Self-pay | Admitting: Hematology and Oncology

## 2020-12-09 ENCOUNTER — Inpatient Hospital Stay: Payer: Medicare Other | Attending: Hematology and Oncology

## 2020-12-09 ENCOUNTER — Telehealth: Payer: Self-pay | Admitting: *Deleted

## 2020-12-09 VITALS — BP 155/67 | HR 57 | Temp 98.0°F | Resp 18

## 2020-12-09 DIAGNOSIS — Z5112 Encounter for antineoplastic immunotherapy: Secondary | ICD-10-CM | POA: Diagnosis not present

## 2020-12-09 DIAGNOSIS — E119 Type 2 diabetes mellitus without complications: Secondary | ICD-10-CM

## 2020-12-09 DIAGNOSIS — E1122 Type 2 diabetes mellitus with diabetic chronic kidney disease: Secondary | ICD-10-CM | POA: Diagnosis not present

## 2020-12-09 DIAGNOSIS — C9110 Chronic lymphocytic leukemia of B-cell type not having achieved remission: Secondary | ICD-10-CM

## 2020-12-09 DIAGNOSIS — N184 Chronic kidney disease, stage 4 (severe): Secondary | ICD-10-CM | POA: Diagnosis not present

## 2020-12-09 DIAGNOSIS — D631 Anemia in chronic kidney disease: Secondary | ICD-10-CM | POA: Diagnosis not present

## 2020-12-09 DIAGNOSIS — D61818 Other pancytopenia: Secondary | ICD-10-CM | POA: Diagnosis not present

## 2020-12-09 DIAGNOSIS — Z79899 Other long term (current) drug therapy: Secondary | ICD-10-CM | POA: Diagnosis not present

## 2020-12-09 DIAGNOSIS — Z7189 Other specified counseling: Secondary | ICD-10-CM | POA: Diagnosis not present

## 2020-12-09 DIAGNOSIS — Z7901 Long term (current) use of anticoagulants: Secondary | ICD-10-CM | POA: Insufficient documentation

## 2020-12-09 LAB — COMPREHENSIVE METABOLIC PANEL
ALT: 13 U/L (ref 0–44)
AST: 12 U/L — ABNORMAL LOW (ref 15–41)
Albumin: 3.8 g/dL (ref 3.5–5.0)
Alkaline Phosphatase: 67 U/L (ref 38–126)
Anion gap: 11 (ref 5–15)
BUN: 24 mg/dL — ABNORMAL HIGH (ref 8–23)
CO2: 22 mmol/L (ref 22–32)
Calcium: 9 mg/dL (ref 8.9–10.3)
Chloride: 107 mmol/L (ref 98–111)
Creatinine, Ser: 2.33 mg/dL — ABNORMAL HIGH (ref 0.61–1.24)
GFR, Estimated: 28 mL/min — ABNORMAL LOW (ref >60)
Glucose, Bld: 262 mg/dL — ABNORMAL HIGH (ref 70–99)
Potassium: 4.6 mmol/L (ref 3.5–5.1)
Sodium: 140 mmol/L (ref 135–145)
Total Bilirubin: 0.6 mg/dL (ref 0.3–1.2)
Total Protein: 6.7 g/dL (ref 6.5–8.1)

## 2020-12-09 LAB — CBC WITH DIFFERENTIAL/PLATELET
Abs Immature Granulocytes: 0.01 10*3/uL (ref 0.00–0.07)
Basophils Absolute: 0 10*3/uL (ref 0.0–0.1)
Basophils Relative: 1 %
Eosinophils Absolute: 0.2 10*3/uL (ref 0.0–0.5)
Eosinophils Relative: 3 %
HCT: 38.1 % — ABNORMAL LOW (ref 39.0–52.0)
Hemoglobin: 12.9 g/dL — ABNORMAL LOW (ref 13.0–17.0)
Immature Granulocytes: 0 %
Lymphocytes Relative: 12 %
Lymphs Abs: 0.7 10*3/uL (ref 0.7–4.0)
MCH: 30.9 pg (ref 26.0–34.0)
MCHC: 33.9 g/dL (ref 30.0–36.0)
MCV: 91.4 fL (ref 80.0–100.0)
Monocytes Absolute: 0.5 10*3/uL (ref 0.1–1.0)
Monocytes Relative: 8 %
Neutro Abs: 4.5 10*3/uL (ref 1.7–7.7)
Neutrophils Relative %: 76 %
Platelets: 140 10*3/uL — ABNORMAL LOW (ref 150–400)
RBC: 4.17 MIL/uL — ABNORMAL LOW (ref 4.22–5.81)
RDW: 14 % (ref 11.5–15.5)
WBC: 5.8 10*3/uL (ref 4.0–10.5)
nRBC: 0 % (ref 0.0–0.2)

## 2020-12-09 LAB — URIC ACID: Uric Acid, Serum: 5 mg/dL (ref 3.7–8.6)

## 2020-12-09 MED ORDER — ACETAMINOPHEN 325 MG PO TABS
650.0000 mg | ORAL_TABLET | Freq: Once | ORAL | Status: AC
  Administered 2020-12-09: 650 mg via ORAL

## 2020-12-09 MED ORDER — DIPHENHYDRAMINE HCL 25 MG PO CAPS
ORAL_CAPSULE | ORAL | Status: AC
  Filled 2020-12-09: qty 1

## 2020-12-09 MED ORDER — ACYCLOVIR 400 MG PO TABS: 400.0000 mg | ORAL_TABLET | Freq: Every day | ORAL | 3 refills | Status: DC

## 2020-12-09 MED ORDER — SODIUM CHLORIDE 0.9 % IV SOLN
Freq: Once | INTRAVENOUS | Status: AC
  Filled 2020-12-09: qty 250

## 2020-12-09 MED ORDER — ACETAMINOPHEN 325 MG PO TABS
ORAL_TABLET | ORAL | Status: AC
  Filled 2020-12-09: qty 2

## 2020-12-09 MED ORDER — DIPHENHYDRAMINE HCL 25 MG PO CAPS
25.0000 mg | ORAL_CAPSULE | Freq: Once | ORAL | Status: AC
  Administered 2020-12-09: 25 mg via ORAL

## 2020-12-09 MED ORDER — SODIUM CHLORIDE 0.9 % IV SOLN
375.0000 mg/m2 | Freq: Once | INTRAVENOUS | Status: AC
  Administered 2020-12-09: 1000 mg via INTRAVENOUS
  Filled 2020-12-09: qty 100

## 2020-12-09 NOTE — Progress Notes (Signed)
Ok to proceed with elevated Scr per Ni Gorsuch, MD 

## 2020-12-09 NOTE — Assessment & Plan Note (Signed)
He missed his dose in January We discussed treatment options and timing of next CT imaging We will resume treatment today and I plan to give him 1 more dose of treatment in April with plan to repeat CT imaging in May After that, we can consider whether it is appropriate or not to give him maintenance treatment or to stop

## 2020-12-09 NOTE — Patient Instructions (Signed)
[facility] Discharge Instructions for Patients Receiving Chemotherapy  Today you received the following chemotherapy agents Rituximab--pvvr  To help prevent nausea and vomiting after your treatment, we encourage you to take your nausea medication as directed   If you develop nausea and vomiting that is not controlled by your nausea medication, call the clinic.   BELOW ARE SYMPTOMS THAT SHOULD BE REPORTED IMMEDIATELY:  *FEVER GREATER THAN 100.5 F  *CHILLS WITH OR WITHOUT FEVER  NAUSEA AND VOMITING THAT IS NOT CONTROLLED WITH YOUR NAUSEA MEDICATION  *UNUSUAL SHORTNESS OF BREATH  *UNUSUAL BRUISING OR BLEEDING  TENDERNESS IN MOUTH AND THROAT WITH OR WITHOUT PRESENCE OF ULCERS  *URINARY PROBLEMS  *BOWEL PROBLEMS  UNUSUAL RASH Items with * indicate a potential emergency and should be followed up as soon as possible.  Feel free to call the clinic should you have any questions or concerns. The clinic phone number is [phone].  Please show the CHEMO ALERT CARD at check-in to the Emergency Department and triage nurse.   

## 2020-12-09 NOTE — Progress Notes (Signed)
[facility] OFFICE PROGRESS NOTE  Patient Care Team: Wenda Low, MD as PCP - General (Internal Medicine) Jerline Pain, MD as PCP - Cardiology (Cardiology) Jerline Pain, MD as Attending Physician (Cardiology)  ASSESSMENT & PLAN:  CLL (chronic lymphocytic leukemia) He missed his dose in January We discussed treatment options and timing of next CT imaging We will resume treatment today and I plan to give him 1 more dose of treatment in April with plan to repeat CT imaging in May After that, we can consider whether it is appropriate or not to give him maintenance treatment or to stop  Pancytopenia, acquired [facility]) The cause of his pancytopenia is multifactorial Anemia is likely anemia of chronic illness and related to chronic kidney disease His mild thrombocytopenia is likely due to fatty liver disease Observe closely for now  Chronic kidney disease (CKD), 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[place]) He has slight worsening renal failure We discussed the importance of adequate hydration and risk factor modification  Diabetes mellitus, type II ([place]) He has poorly controlled diabetes I recommend close follow-up with primary care doctor for medication adjustment I also gave the patient warning about his poor dietary habits and food choices that he needs to take good care of himself while on treatment   No orders of the defined types were placed in this encounter.   All questions were answered. The patient knows to call the clinic with any problems, questions or concerns. The total time spent in the appointment was 20 minutes encounter with patients including review of chart and various tests results, discussions about plan of care and coordination of care plan   [name], MD 12/09/2020 12:44 PM  INTERVAL HISTORY: Please see below for problem oriented charting. He returns for treatment here According to the patient, he usually spent January to March in [place] He received 1 dose  of rituximab in [place] in February He has no new lymphadenopathy  SUMMARY OF ONCOLOGIC HISTORY: Oncology History Overview Note  Normal FISH   CLL (chronic lymphocytic leukemia) ([place])  09/10/2013 Initial Diagnosis   CLL (chronic lymphocytic leukemia)   12/26/2013 Pathology Results   FISH analysis was normal.   06/17/2014 Procedure   The patient has placement of Infuse-a-Port.   06/18/2014 Imaging   CT scan of the chest, abdomen and pelvis showed diffuse lymphadenopathy and splenomegaly.   06/19/2014 Bone Marrow Biopsy   Bone marrow aspirate and biopsy show CLL.   06/24/2014 - 11/18/2014 Chemotherapy   He received 6 cycles of Obinutuzumab and chlorambucil   09/15/2014 Imaging   Repeat CT scan of the chest, abdomen and pelvis show greater than 50% reduction in lymphadenopathy and splenomegaly   12/19/2014 Imaging   Repeat CT scan showed complete resolution of lymphadenopathy and splenomegaly.   03/09/2020 Cancer Staging   Staging form: Chronic Lymphocytic Leukemia / Small Lymphocytic Lymphoma, AJCC 8th Edition - Clinical stage from 03/09/2020: Modified Rai Stage I (Modified Rai risk: Intermediate, Lymphocytosis: Present, Adenopathy: Present, Organomegaly: Absent, Anemia: Absent, Thrombocytopenia: Absent) - Signed by [name], MD on 03/09/2020   04/22/2020 Imaging   CT neck 1. Cervical adenopathy involving the bilateral level 1B, right 2/3 and bilateral level 5 nodal stations. 2. Prominent to mildly enlarged upper mediastinal nodes. 3. Prominent subcentimeter right intraparotid node.     06/03/2020 -  Chemotherapy   The patient had rituximab for chemotherapy treatment.     08/31/2020 Imaging   CT neck 1. Regression of Leukemia. Resolved widespread cervical lymphadenopathy since August.  Largest residual left level IIIb node now 8-9 mm short axis (previously 16 mm). 2.  CT Chest, Abdomen, and Pelvis today are reported separately.     08/31/2020 Imaging   Limited evaluation due to  lack of intravenous contrast administration.   7.1 cm left perirenal soft tissue mass along the posterior left upper kidney, suspicious for perirenal lymphoma, although poorly evaluated. This is essentially new from May 2021.   Possible periureteral soft tissue along the right proximal collecting system/ureter, equivocal.   New 12 mm right lower lobe pulmonary nodule, suspicious for metastasis/pulmonary lymphoma. Additional 6 mm (mean diameter) subpleural nodule in the lingula is new from 2016, indeterminate.   No suspicious lymphadenopathy in the chest, abdomen, or pelvis. Spleen is normal in size.       REVIEW OF SYSTEMS:   Constitutional: Denies fevers, chills or abnormal weight loss Eyes: Denies blurriness of vision Ears, nose, mouth, throat, and face: Denies mucositis or sore throat Respiratory: Denies cough, dyspnea or wheezes Cardiovascular: Denies palpitation, chest discomfort or lower extremity swelling Gastrointestinal:  Denies nausea, heartburn or change in bowel habits Skin: Denies abnormal skin rashes Lymphatics: Denies new lymphadenopathy or easy bruising Neurological:Denies numbness, tingling or new weaknesses Behavioral/Psych: Mood is stable, no new changes  All other systems were reviewed with the patient and are negative.  I have reviewed the past medical history, past surgical history, social history and family history with the patient and they are unchanged from previous note.  ALLERGIES:  is allergic to lipitor [atorvastatin] and metformin.  MEDICATIONS:  Current Outpatient Medications  Medication Sig Dispense Refill  . acyclovir (ZOVIRAX) 400 MG tablet Take 1 tablet (400 mg total) by mouth daily. 30 tablet 3  . allopurinol (ZYLOPRIM) 300 MG tablet Take 300 mg by mouth daily.    Marland Kitchen amiodarone (PACERONE) 200 MG tablet Take 1 tablet (200 mg total) by mouth daily. 30 tablet 6  . doxepin (SINEQUAN) 10 MG capsule Take 10 mg by mouth at bedtime.    Marland Kitchen glimepiride  (AMARYL) 2 MG tablet Take 2 mg by mouth daily with breakfast.    . glucose blood test strip Dispense glucometer, strips and lancets preferred by patient's insurance (DX: E11.9 / type 2 DM)    . JARDIANCE 10 MG TABS tablet TAKE 1 TABLET BY MOUTH EVERY DAY (Patient not taking: No sig reported) 90 mg 3  . levothyroxine (SYNTHROID) 25 MCG tablet Take 25 mcg by mouth daily before breakfast.    . Needle, Disp, (HYPODERMIC NEEDLE 26GX5/8") 26G X 5/8" MISC See admin instructions.    . Omega-3 Fatty Acids (FISH OIL PO) Take 2 capsules by mouth daily.    . rivaroxaban (XARELTO) 20 MG TABS tablet Take 1 tablet (20 mg total) by mouth daily with supper. 30 tablet 6  . rosuvastatin (CRESTOR) 10 MG tablet Take 10 mg by mouth daily.    . tamsulosin (FLOMAX) 0.4 MG CAPS capsule Take 0.4 mg by mouth daily.     No current facility-administered medications for this visit.    PHYSICAL EXAMINATION: ECOG PERFORMANCE STATUS: 1 - Symptomatic but completely ambulatory  Vitals:   12/09/20 1200  BP: (!) 160/20  Pulse: (!) 55  Resp: 18  Temp: 98 F (36.7 C)  SpO2: 98%   Filed Weights   12/09/20 1200  Weight: (!) 314 lb 12.8 oz (142.8 kg)    GENERAL:alert, no distress and comfortable.  Limited exam due to central obesity SKIN: skin color, texture, turgor are normal, no rashes or  significant lesions.  Noted skin bruising EYES: normal, Conjunctiva are pink and non-injected, sclera clear OROPHARYNX:no exudate, no erythema and lips, buccal mucosa, and tongue normal  NECK: supple, thyroid normal size, non-tender, without nodularity LYMPH:  no palpable lymphadenopathy in the cervical, axillary or inguinal LUNGS: clear to auscultation and percussion with normal breathing effort HEART: regular rate & rhythm and no murmurs and no lower extremity edema ABDOMEN:abdomen soft, non-tender and normal bowel sounds Musculoskeletal:no cyanosis of digits and no clubbing  NEURO: alert & oriented x 3 with fluent speech, no  focal motor/sensory deficits  LABORATORY DATA:  I have reviewed the data as listed    Component Value Date/Time   NA 140 12/09/2020 1125   NA 138 10/25/2018 1120   NA 140 06/25/2015 0758   K 4.6 12/09/2020 1125   K 3.9 06/25/2015 0758   CL 107 12/09/2020 1125   CL 101 03/11/2013 1031   CO2 22 12/09/2020 1125   CO2 25 06/25/2015 0758   GLUCOSE 262 (H) 12/09/2020 1125   GLUCOSE 140 06/25/2015 0758   GLUCOSE 173 (H) 03/11/2013 1031   BUN 24 (H) 12/09/2020 1125   BUN 16 10/25/2018 1120   BUN 15.9 06/25/2015 0758   CREATININE 2.33 (H) 12/09/2020 1125   CREATININE 2.20 (H) 06/01/2020 0957   CREATININE 1.0 06/25/2015 0758   CALCIUM 9.0 12/09/2020 1125   CALCIUM 9.3 06/25/2015 0758   PROT 6.7 12/09/2020 1125   PROT 6.7 06/25/2015 0758   ALBUMIN 3.8 12/09/2020 1125   ALBUMIN 3.8 06/25/2015 0758   AST 12 (L) 12/09/2020 1125   AST 15 06/01/2020 0957   AST 15 06/25/2015 0758   ALT 13 12/09/2020 1125   ALT 11 06/01/2020 0957   ALT 14 06/25/2015 0758   ALKPHOS 67 12/09/2020 1125   ALKPHOS 61 06/25/2015 0758   BILITOT 0.6 12/09/2020 1125   BILITOT 0.8 06/01/2020 0957   BILITOT 0.95 06/25/2015 0758   GFRNONAA 28 (L) 12/09/2020 1125   GFRNONAA 28 (L) 06/01/2020 0957   GFRAA 29 (L) 06/09/2020 0847   GFRAA 32 (L) 06/01/2020 0957    No results found for: SPEP, UPEP  Lab Results  Component Value Date   WBC 5.8 12/09/2020   NEUTROABS 4.5 12/09/2020   HGB 12.9 (L) 12/09/2020   HCT 38.1 (L) 12/09/2020   MCV 91.4 12/09/2020   PLT 140 (L) 12/09/2020      Chemistry      Component Value Date/Time   NA 140 12/09/2020 1125   NA 138 10/25/2018 1120   NA 140 06/25/2015 0758   K 4.6 12/09/2020 1125   K 3.9 06/25/2015 0758   CL 107 12/09/2020 1125   CL 101 03/11/2013 1031   CO2 22 12/09/2020 1125   CO2 25 06/25/2015 0758   BUN 24 (H) 12/09/2020 1125   BUN 16 10/25/2018 1120   BUN 15.9 06/25/2015 0758   CREATININE 2.33 (H) 12/09/2020 1125   CREATININE 2.20 (H) 06/01/2020 0957    CREATININE 1.0 06/25/2015 0758      Component Value Date/Time   CALCIUM 9.0 12/09/2020 1125   CALCIUM 9.3 06/25/2015 0758   ALKPHOS 67 12/09/2020 1125   ALKPHOS 61 06/25/2015 0758   AST 12 (L) 12/09/2020 1125   AST 15 06/01/2020 0957   AST 15 06/25/2015 0758   ALT 13 12/09/2020 1125   ALT 11 06/01/2020 0957   ALT 14 06/25/2015 0758   BILITOT 0.6 12/09/2020 1125   BILITOT 0.8 06/01/2020 0957   BILITOT  0.95 06/25/2015 0758

## 2020-12-09 NOTE — Assessment & Plan Note (Signed)
The cause of his pancytopenia is multifactorial Anemia is likely anemia of chronic illness and related to chronic kidney disease His mild thrombocytopenia is likely due to fatty liver disease Observe closely for now

## 2020-12-09 NOTE — Telephone Encounter (Signed)
Left message to return a call to discuss sleep study appointment details. 

## 2020-12-09 NOTE — Telephone Encounter (Signed)
Patient returned a call and was give sleep study appointment details.

## 2020-12-09 NOTE — Assessment & Plan Note (Signed)
He has slight worsening renal failure We discussed the importance of adequate hydration and risk factor modification

## 2020-12-09 NOTE — Assessment & Plan Note (Signed)
He has poorly controlled diabetes I recommend close follow-up with primary care doctor for medication adjustment I also gave the patient warning about his poor dietary habits and food choices that he needs to take good care of himself while on treatment

## 2020-12-10 LAB — GLUCOSE, CAPILLARY: Glucose-Capillary: 204 mg/dL — ABNORMAL HIGH (ref 70–99)

## 2020-12-17 ENCOUNTER — Ambulatory Visit (INDEPENDENT_AMBULATORY_CARE_PROVIDER_SITE_OTHER): Payer: Medicare Other

## 2020-12-17 ENCOUNTER — Ambulatory Visit (INDEPENDENT_AMBULATORY_CARE_PROVIDER_SITE_OTHER): Payer: Medicare Other | Admitting: Podiatrist

## 2020-12-17 ENCOUNTER — Other Ambulatory Visit: Payer: Self-pay

## 2020-12-17 ENCOUNTER — Encounter: Payer: Self-pay | Admitting: Podiatrist

## 2020-12-17 DIAGNOSIS — B351 Tinea unguium: Secondary | ICD-10-CM

## 2020-12-17 DIAGNOSIS — M2041 Other hammer toe(s) (acquired), right foot: Secondary | ICD-10-CM

## 2020-12-17 DIAGNOSIS — M25571 Pain in right ankle and joints of right foot: Secondary | ICD-10-CM

## 2020-12-17 DIAGNOSIS — L608 Other nail disorders: Secondary | ICD-10-CM | POA: Diagnosis not present

## 2020-12-17 DIAGNOSIS — M25572 Pain in left ankle and joints of left foot: Secondary | ICD-10-CM | POA: Diagnosis not present

## 2020-12-17 DIAGNOSIS — M2042 Other hammer toe(s) (acquired), left foot: Secondary | ICD-10-CM

## 2020-12-17 NOTE — Progress Notes (Signed)
Chief Complaint  Patient presents with  . Foot Pain    Bilateral foot pain     HPI: Patient is 80 y.o. male who presents today for pain on the front of his ankles and feet bilateral.  He relates he also has painful toenails which tend to become ingrown at times.  He relates he had an accident where a bobcat tractor ran over his foot and caused a severe injury to the right foot which required a muscle and skin graft to save the foot.  He now has a residual large mass of soft tissue on the dorsum of the right foot.  He relates it has gone down significantly since the injury and he is now able to get a shoe on the foot .  He also relates he was wearing some socks that were tight on his feet and since changing to a sock with more give, his ankle pain has improved.    Patient Active Problem List   Diagnosis Date Noted  . PAF (paroxysmal atrial fibrillation) ([place])   . Nodule of right lung 09/01/2020  . Persistent atrial fibrillation ([place]) 08/24/2020  . Secondary hypercoagulable state ([place]) 08/24/2020  . Non compliance w medication regimen 08/04/2020  . Chronic kidney disease (CKD), 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[place]) 06/09/2020  . Acute prerenal failure ([place]) 06/01/2020  . Goals of care, counseling/discussion 05/14/2020  . Lymphadenopathy of head and neck 03/09/2020  . Pancytopenia, acquired ([place]) 03/09/2020  . DOE (dyspnea on exertion) 11/09/2018  . Dilated cardiomyopathy ([place]) 09/14/2018  . Atypical atrial flutter ([place])   . Elevated serum creatinine 03/06/2018  . Basal cell carcinoma (BCC) of scalp 03/06/2018  . 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 Preventive measure 06/25/2015  . Hyperlipidemia 06/02/2015  . 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 Recent upper respiratory tract infection 11/18/2014  . Leukopenia due to antineoplastic chemotherapy ([place]) 10/21/2014  . Thrombocytopenia ([place]) 08/19/2014  . Hypersensitivity reaction 06/25/2014  . At high risk of  tumor lysis syndrome 06/23/2014  . Anemia in chronic illness 03/14/2014  . Weight loss, non-intentional 03/14/2014  . CLL (chronic lymphocytic leukemia) ([place]) 09/10/2013  . CAD (coronary artery disease) 08/28/2013  . Hypertension   . Diabetes mellitus, type II ([place])   . Obesity   . Hypertriglyceridemia   . Abnormal cardiovascular function study 01/17/2013    Current Outpatient Medications on File Prior to Visit  Medication Sig Dispense Refill  . acyclovir (ZOVIRAX) 400 MG tablet Take 1 tablet (400 mg total) by mouth daily. 30 tablet 3  . allopurinol (ZYLOPRIM) 300 MG tablet Take 300 mg by mouth daily.    Marland Kitchen amiodarone (PACERONE) 200 MG tablet Take 1 tablet (200 mg total) by mouth daily. 30 tablet 6  . doxepin (SINEQUAN) 10 MG capsule Take 10 mg by mouth at bedtime.    Marland Kitchen glimepiride (AMARYL) 2 MG tablet Take 2 mg by mouth daily with breakfast.    . glucose blood test strip Dispense glucometer, strips and lancets preferred by patient's insurance (DX: E11.9 / type 2 DM)    . JARDIANCE 10 MG TABS tablet TAKE 1 TABLET BY MOUTH EVERY DAY (Patient not taking: No sig reported) 90 mg 3  . levothyroxine (SYNTHROID) 25 MCG tablet Take 25 mcg by mouth daily before breakfast.    . Needle, Disp, (HYPODERMIC NEEDLE 26GX5/8") 26G X 5/8" MISC See admin instructions.    . Omega-3 Fatty Acids (FISH OIL PO) Take 2 capsules by mouth  daily.    . rivaroxaban (XARELTO) 20 MG TABS tablet Take 1 tablet (20 mg total) by mouth daily with supper. 30 tablet 6  . rosuvastatin (CRESTOR) 10 MG tablet Take 10 mg by mouth daily.    . tamsulosin (FLOMAX) 0.4 MG CAPS capsule Take 0.4 mg by mouth daily.     No current facility-administered medications on file prior to visit.    Allergies  Allergen Reactions  . Lipitor [Atorvastatin] Rash  . Metformin Diarrhea    Review of Systems No fevers, chills, nausea, muscle aches, no difficulty breathing, no calf pain, no chest pain or shortness of breath.   Physical  Exam  GENERAL APPEARANCE: Alert, conversant. Appropriately groomed. No acute distress.   VASCULAR: Pedal pulses palpable DP and PT left, and PT right. DP pulse unable to palpate due to the skin graft.  Capillary refill time is immediate to all digits,  Proximal to distal cooling it warm to warm.  Digital perfusion adequate bialterally.   NEUROLOGIC: sensation is intact to 5.07 monofilament at 5/5 sites bilateral.  Light touch is intact bilateral, vibratory sensation intact bilateral  MUSCULOSKELETAL: acceptable muscle strength, tone and stability bilateral.  No gross boney pedal deformities noted. Mild decrease in ankle range of motion bilateral.  Mild pain at the sinus tarsi region is noted left and anterior ankle of the right ankle.    DERMATOLOGIC: skin is warm, supple, and dry.  No open lesions noted.  No rash, no pre ulcerative lesions. Skin graft noted on the dorsolateral right foot.  Digital nails are thick, discolored, dystrophic, brittle with subungual debris present and clinically mycotic x  Bilateral hallux nails -  Incurvation on the medial and lateral borders noted bilateral.  Remainder of nails are normal and asymptomatic.    Radiographic exam:  Normal osseous mineralization.  No fracture or dislocation or acute osseous abnormalities present.  Joint spaces are normal.  Large soft tissue mass consistent with skin graft/muscle graft noted on xray right.   Arthritic spurring is also present midfoot bilateral.  Mild hammertoe second toe bilateral noted as well.     Assessment     ICD-10-CM   1. Acquired hammertoes of both feet  M20.41 DG Foot Complete Right   M20.42 DG Foot Complete Left  2. Sinus tarsi syndrome of both ankles  M25.571    M25.572   3. Onychomycosis  B35.1   4. Incurvated nail  L60.8      Plan  -Discussed exam and xray findings.  Discussed it very well be the old socks that caused his pain.  Also discussed the possibility of sinus tarsi inflammation.  He  would like to try his new socks and see if this helps-  If it does not, he will call and would consider an injection - debridement of the toenails was carried out without complication today. Routine debridements recommended on bilateral great toenails in 3 months or prn. -discussed the second digit hammertoes.  They are mild.  Discussed if they progressed, a surgical intervention could be considered. He will wait and see if they progress.   - he will call as needed for follow up.  If any concerns arise he will also let us know.

## 2020-12-17 NOTE — Patient Instructions (Signed)
See if the socks relieve the pressure on your foot and ankle  Try VOLTAREN gel on your toes and your ankles to see if that will help.  Call if the ankle pain returns.

## 2020-12-21 DIAGNOSIS — N3021 Other chronic cystitis with hematuria: Secondary | ICD-10-CM | POA: Diagnosis not present

## 2020-12-21 DIAGNOSIS — N200 Calculus of kidney: Secondary | ICD-10-CM | POA: Diagnosis not present

## 2020-12-25 ENCOUNTER — Telehealth: Payer: Self-pay

## 2020-12-25 NOTE — Telephone Encounter (Signed)
Family member called and left a message. Dr. Louis Meckel did not order a CT scan. Just FYI.

## 2020-12-25 NOTE — Telephone Encounter (Signed)
OK, I will order it after I see him

## 2020-12-29 ENCOUNTER — Other Ambulatory Visit (HOSPITAL_COMMUNITY): Payer: Self-pay | Admitting: Physician Assistant

## 2020-12-30 DIAGNOSIS — E119 Type 2 diabetes mellitus without complications: Secondary | ICD-10-CM | POA: Diagnosis not present

## 2020-12-30 DIAGNOSIS — I2510 Atherosclerotic heart disease of native coronary artery without angina pectoris: Secondary | ICD-10-CM | POA: Diagnosis not present

## 2020-12-30 DIAGNOSIS — E039 Hypothyroidism, unspecified: Secondary | ICD-10-CM | POA: Diagnosis not present

## 2020-12-30 DIAGNOSIS — E782 Mixed hyperlipidemia: Secondary | ICD-10-CM | POA: Diagnosis not present

## 2020-12-30 DIAGNOSIS — I10 Essential (primary) hypertension: Secondary | ICD-10-CM | POA: Diagnosis not present

## 2020-12-30 DIAGNOSIS — N400 Enlarged prostate without lower urinary tract symptoms: Secondary | ICD-10-CM | POA: Diagnosis not present

## 2020-12-30 DIAGNOSIS — N182 Chronic kidney disease, stage 2 (mild): Secondary | ICD-10-CM | POA: Diagnosis not present

## 2020-12-30 DIAGNOSIS — E1122 Type 2 diabetes mellitus with diabetic chronic kidney disease: Secondary | ICD-10-CM | POA: Diagnosis not present

## 2021-01-06 ENCOUNTER — Inpatient Hospital Stay (HOSPITAL_BASED_OUTPATIENT_CLINIC_OR_DEPARTMENT_OTHER): Payer: Medicare Other | Admitting: Hematology and Oncology

## 2021-01-06 ENCOUNTER — Inpatient Hospital Stay: Payer: Medicare Other

## 2021-01-06 ENCOUNTER — Encounter: Payer: Self-pay | Admitting: Hematology and Oncology

## 2021-01-06 ENCOUNTER — Inpatient Hospital Stay: Payer: Medicare Other | Attending: Hematology and Oncology

## 2021-01-06 ENCOUNTER — Other Ambulatory Visit: Payer: Self-pay

## 2021-01-06 VITALS — BP 145/57 | HR 54 | Temp 97.4°F | Resp 18 | Ht 76.0 in | Wt 317.2 lb

## 2021-01-06 VITALS — BP 161/74 | HR 55 | Temp 97.6°F | Resp 18

## 2021-01-06 DIAGNOSIS — C9110 Chronic lymphocytic leukemia of B-cell type not having achieved remission: Secondary | ICD-10-CM

## 2021-01-06 DIAGNOSIS — Z79899 Other long term (current) drug therapy: Secondary | ICD-10-CM | POA: Diagnosis not present

## 2021-01-06 DIAGNOSIS — Z5112 Encounter for antineoplastic immunotherapy: Secondary | ICD-10-CM | POA: Insufficient documentation

## 2021-01-06 DIAGNOSIS — E119 Type 2 diabetes mellitus without complications: Secondary | ICD-10-CM

## 2021-01-06 DIAGNOSIS — E1122 Type 2 diabetes mellitus with diabetic chronic kidney disease: Secondary | ICD-10-CM | POA: Diagnosis not present

## 2021-01-06 DIAGNOSIS — D61818 Other pancytopenia: Secondary | ICD-10-CM | POA: Insufficient documentation

## 2021-01-06 DIAGNOSIS — N184 Chronic kidney disease, stage 4 (severe): Secondary | ICD-10-CM | POA: Diagnosis not present

## 2021-01-06 DIAGNOSIS — Z7189 Other specified counseling: Secondary | ICD-10-CM

## 2021-01-06 DIAGNOSIS — Z7901 Long term (current) use of anticoagulants: Secondary | ICD-10-CM | POA: Diagnosis not present

## 2021-01-06 LAB — COMPREHENSIVE METABOLIC PANEL
ALT: 12 U/L (ref 0–44)
AST: 14 U/L — ABNORMAL LOW (ref 15–41)
Albumin: 3.8 g/dL (ref 3.5–5.0)
Alkaline Phosphatase: 75 U/L (ref 38–126)
Anion gap: 9 (ref 5–15)
BUN: 26 mg/dL — ABNORMAL HIGH (ref 8–23)
CO2: 21 mmol/L — ABNORMAL LOW (ref 22–32)
Calcium: 8.8 mg/dL — ABNORMAL LOW (ref 8.9–10.3)
Chloride: 107 mmol/L (ref 98–111)
Creatinine, Ser: 2.2 mg/dL — ABNORMAL HIGH (ref 0.61–1.24)
GFR, Estimated: 30 mL/min — ABNORMAL LOW (ref >60)
Glucose, Bld: 202 mg/dL — ABNORMAL HIGH (ref 70–99)
Potassium: 4.6 mmol/L (ref 3.5–5.1)
Sodium: 137 mmol/L (ref 135–145)
Total Bilirubin: 0.5 mg/dL (ref 0.3–1.2)
Total Protein: 6.8 g/dL (ref 6.5–8.1)

## 2021-01-06 LAB — URIC ACID: Uric Acid, Serum: 4.7 mg/dL (ref 3.7–8.6)

## 2021-01-06 LAB — CBC WITH DIFFERENTIAL/PLATELET
Abs Immature Granulocytes: 0.02 10*3/uL (ref 0.00–0.07)
Basophils Absolute: 0.1 10*3/uL (ref 0.0–0.1)
Basophils Relative: 1 %
Eosinophils Absolute: 0.2 10*3/uL (ref 0.0–0.5)
Eosinophils Relative: 2 %
HCT: 37.7 % — ABNORMAL LOW (ref 39.0–52.0)
Hemoglobin: 12.7 g/dL — ABNORMAL LOW (ref 13.0–17.0)
Immature Granulocytes: 0 %
Lymphocytes Relative: 13 %
Lymphs Abs: 1 10*3/uL (ref 0.7–4.0)
MCH: 30.3 pg (ref 26.0–34.0)
MCHC: 33.7 g/dL (ref 30.0–36.0)
MCV: 90 fL (ref 80.0–100.0)
Monocytes Absolute: 0.7 10*3/uL (ref 0.1–1.0)
Monocytes Relative: 9 %
Neutro Abs: 5.7 10*3/uL (ref 1.7–7.7)
Neutrophils Relative %: 75 %
Platelets: 146 10*3/uL — ABNORMAL LOW (ref 150–400)
RBC: 4.19 MIL/uL — ABNORMAL LOW (ref 4.22–5.81)
RDW: 14.3 % (ref 11.5–15.5)
WBC: 7.5 10*3/uL (ref 4.0–10.5)
nRBC: 0 % (ref 0.0–0.2)

## 2021-01-06 MED ORDER — SODIUM CHLORIDE 0.9 % IV SOLN
375.0000 mg/m2 | Freq: Once | INTRAVENOUS | Status: AC
  Administered 2021-01-06: 1000 mg via INTRAVENOUS
  Filled 2021-01-06: qty 100

## 2021-01-06 MED ORDER — ACETAMINOPHEN 325 MG PO TABS
650.0000 mg | ORAL_TABLET | Freq: Once | ORAL | Status: AC
  Administered 2021-01-06: 650 mg via ORAL

## 2021-01-06 MED ORDER — DIPHENHYDRAMINE HCL 25 MG PO CAPS
25.0000 mg | ORAL_CAPSULE | Freq: Once | ORAL | Status: AC
  Administered 2021-01-06: 25 mg via ORAL

## 2021-01-06 MED ORDER — DIPHENHYDRAMINE HCL 25 MG PO CAPS
ORAL_CAPSULE | ORAL | Status: AC
  Filled 2021-01-06: qty 1

## 2021-01-06 MED ORDER — ACETAMINOPHEN 325 MG PO TABS
ORAL_TABLET | ORAL | Status: AC
  Filled 2021-01-06: qty 2

## 2021-01-06 MED ORDER — SODIUM CHLORIDE 0.9 % IV SOLN
Freq: Once | INTRAVENOUS | Status: AC
Start: 2021-01-06 — End: 2021-01-06
  Filled 2021-01-06: qty 250

## 2021-01-06 NOTE — Assessment & Plan Note (Signed)
Clinically, he appears to be responding well to treatment We will resume treatment today and I plan to give him 1 more dose of treatment in April with plan to repeat CT imaging in May After that, we can consider whether it is appropriate or not to give him maintenance treatment or to stop

## 2021-01-06 NOTE — Patient Instructions (Signed)
Rituximab Injection What is this medicine? RITUXIMAB (ri TUX i mab) is a monoclonal antibody. It is used to treat certain types of cancer like non-Hodgkin lymphoma and chronic lymphocytic leukemia. It is also used to treat rheumatoid arthritis, granulomatosis with polyangiitis, microscopic polyangiitis, and pemphigus vulgaris. This medicine may be used for other purposes; ask your health care provider or pharmacist if you have questions. COMMON BRAND NAME(S): RIABNI, Rituxan, RUXIENCE What should I tell my health care provider before I take this medicine? They need to know if you have any of these conditions:  chest pain  heart disease  infection especially a viral infection such as chickenpox, cold sores, hepatitis B, or herpes  immune system problems  irregular heartbeat or rhythm  kidney disease  low blood counts (white cells, platelets, or red cells)  lung disease  recent or upcoming vaccine  an unusual or allergic reaction to rituximab, other medicines, foods, dyes, or preservatives  pregnant or trying to get pregnant  breast-feeding How should I use this medicine? This medicine is injected into a vein. It is given by a health care provider in a hospital or clinic setting. A special MedGuide will be given to you before each treatment. Be sure to read this information carefully each time. Talk to your health care provider about the use of this medicine in children. While this drug may be prescribed for children as young as 2 years for selected conditions, precautions do apply. Overdosage: If you think you have taken too much of this medicine contact a poison control center or emergency room at once. NOTE: This medicine is only for you. Do not share this medicine with others. What if I miss a dose? Keep appointments for follow-up doses. It is important not to miss your dose. Call your health care provider if you are unable to keep an appointment. What may interact with this  medicine? Do not take this medicine with any of the following medicines:  live vaccines This medicine may also interact with the following medicines:  cisplatin This list may not describe all possible interactions. Give your health care provider a list of all the medicines, herbs, non-prescription drugs, or dietary supplements you use. Also tell them if you smoke, drink alcohol, or use illegal drugs. Some items may interact with your medicine. What should I watch for while using this medicine? Your condition will be monitored carefully while you are receiving this medicine. You may need blood work done while you are taking this medicine. This medicine can cause serious infusion reactions. To reduce the risk your health care provider may give you other medicines to take before receiving this one. Be sure to follow the directions from your health care provider. This medicine may increase your risk of getting an infection. Call your health care provider for advice if you get a fever, chills, sore throat, or other symptoms of a cold or flu. Do not treat yourself. Try to avoid being around people who are sick. Call your health care provider if you are around anyone with measles, chickenpox, or if you develop sores or blisters that do not heal properly. Avoid taking medicines that contain aspirin, acetaminophen, ibuprofen, naproxen, or ketoprofen unless instructed by your health care provider. These medicines may hide a fever. This medicine may cause serious skin reactions. They can happen weeks to months after starting the medicine. Contact your health care provider right away if you notice fevers or flu-like symptoms with a rash. The rash may be red   or purple and then turn into blisters or peeling of the skin. Or, you might notice a red rash with swelling of the face, lips or lymph nodes in your neck or under your arms. In some patients, this medicine may cause a serious brain infection that may cause  death. If you have any problems seeing, thinking, speaking, walking, or standing, tell your healthcare professional right away. If you cannot reach your healthcare professional, urgently seek other source of medical care. Do not become pregnant while taking this medicine or for at least 12 months after stopping it. Women should inform their health care provider if they wish to become pregnant or think they might be pregnant. There is potential for serious harm to an unborn child. Talk to your health care provider for more information. Women should use a reliable form of birth control while taking this medicine and for 12 months after stopping it. Do not breast-feed while taking this medicine or for at least 6 months after stopping it. What side effects may I notice from receiving this medicine? Side effects that you should report to your health care provider as soon as possible:  allergic reactions (skin rash, itching or hives; swelling of the face, lips, or tongue)  diarrhea  edema (sudden weight gain; swelling of the ankles, feet, hands or other unusual swelling; trouble breathing)  fast, irregular heartbeat  heart attack (trouble breathing; pain or tightness in the chest, neck, back or arms; unusually weak or tired)  infection (fever, chills, cough, sore throat, pain or trouble passing urine)  kidney injury (trouble passing urine or change in the amount of urine)  liver injury (dark yellow or brown urine; general ill feeling or flu-like symptoms; loss of appetite, right upper belly pain; unusually weak or tired, yellowing of the eyes or skin)  low blood pressure (dizziness; feeling faint or lightheaded, falls; unusually weak or tired)  low red blood cell counts (trouble breathing; feeling faint; lightheaded, falls; unusually weak or tired)  mouth sores  redness, blistering, peeling, or loosening of the skin, including inside the mouth  stomach pain  unusual bruising or  bleeding  wheezing (trouble breathing with loud or whistling sounds)  vomiting Side effects that usually do not require medical attention (report to your health care provider if they continue or are bothersome):  headache  joint pain  muscle cramps, pain  nausea This list may not describe all possible side effects. Call your doctor for medical advice about side effects. You may report side effects to FDA at 1-800-FDA-1088. Where should I keep my medicine? This medicine is given in a hospital or clinic. It will not be stored at home. NOTE: This sheet is a summary. It may not cover all possible information. If you have questions about this medicine, talk to your doctor, pharmacist, or health care provider.  2021 Elsevier/Gold Standard (2020-06-18 21:35:50)

## 2021-01-06 NOTE — Assessment & Plan Note (Signed)
He has recent uncontrolled diabetes Whenever his blood sugar is over 250, we will give him 1 dose of insulin We discussed the importance of dietary modification

## 2021-01-06 NOTE — Progress Notes (Signed)
[facility] OFFICE PROGRESS NOTE  Patient Care Team: Wenda Low, MD as PCP - General (Internal Medicine) Jerline Pain, MD as PCP - Cardiology (Cardiology) Jerline Pain, MD as Attending Physician (Cardiology)  ASSESSMENT & PLAN:  CLL (chronic lymphocytic leukemia) Clinically, he appears to be responding well to treatment We will resume treatment today and I plan to give him 1 more dose of treatment in April with plan to repeat CT imaging in May After that, we can consider whether it is appropriate or not to give him maintenance treatment or to stop  Pancytopenia, acquired [facility]) The cause of his pancytopenia is multifactorial Anemia is likely anemia of chronic illness and related to chronic kidney disease His mild thrombocytopenia is likely due to fatty liver disease Observe closely for now  Chronic kidney disease (CKD), 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[place]) Due to his renal failure, he will not get IV contrast with CT imaging We discussed the importance of adequate hydration and risk factor modification His treatment does not require dose adjustment with renal dysfunction  Diabetes mellitus, type II ([place]) He has recent uncontrolled diabetes Whenever his blood sugar is over 250, we will give him 1 dose of insulin We discussed the importance of dietary modification   Orders Placed This Encounter  Procedures  . CT Chest Wo Contrast    Standing Status:   Future    Standing Expiration Date:   01/06/2022    Order Specific Question:   Preferred imaging location?    Answer:   [facility]  . CT Abdomen Pelvis Wo Contrast    Standing Status:   Future    Standing Expiration Date:   01/06/2022    Order Specific Question:   Preferred imaging location?    Answer:   [facility]    Order Specific Question:   Is Oral Contrast requested for this exam?    Answer:   Yes, Per Radiology protocol    All questions were answered. The patient knows to call the clinic with  any problems, questions or concerns. The total time spent in the appointment was 30 minutes encounter with patients including review of chart and various tests results, discussions about plan of care and coordination of care plan   [name], MD 01/06/2021 9:59 AM  INTERVAL HISTORY: Please see below for problem oriented charting. He returns for rituximab treatment today Since last time I saw him, he had 1 episode of UTI He is completing a course of antibiotics treatment He felt better He has occasional skin itching He had significant skin bruises He is attempting to cut back on alcohol intake and admits he might not be drinking enough fluids His documented blood sugar at home is satisfactory  SUMMARY OF ONCOLOGIC HISTORY: Oncology History Overview Note  Normal FISH   CLL (chronic lymphocytic leukemia) ([place])  09/10/2013 Initial Diagnosis   CLL (chronic lymphocytic leukemia)   12/26/2013 Pathology Results   FISH analysis was normal.   06/17/2014 Procedure   The patient has placement of Infuse-a-Port.   06/18/2014 Imaging   CT scan of the chest, abdomen and pelvis showed diffuse lymphadenopathy and splenomegaly.   06/19/2014 Bone Marrow Biopsy   Bone marrow aspirate and biopsy show CLL.   06/24/2014 - 11/18/2014 Chemotherapy   He received 6 cycles of Obinutuzumab and chlorambucil   09/15/2014 Imaging   Repeat CT scan of the chest, abdomen and pelvis show greater than 50% reduction in lymphadenopathy and splenomegaly   12/19/2014 Imaging  Repeat CT scan showed complete resolution of lymphadenopathy and splenomegaly.   03/09/2020 Cancer Staging   Staging form: Chronic Lymphocytic Leukemia / Small Lymphocytic Lymphoma, AJCC 8th Edition - Clinical stage from 03/09/2020: Modified Rai Stage I (Modified Rai risk: Intermediate, Lymphocytosis: Present, Adenopathy: Present, Organomegaly: Absent, Anemia: Absent, Thrombocytopenia: Absent) - Signed by [name], MD on 03/09/2020   04/22/2020  Imaging   CT neck 1. Cervical adenopathy involving the bilateral level 1B, right 2/3 and bilateral level 5 nodal stations. 2. Prominent to mildly enlarged upper mediastinal nodes. 3. Prominent subcentimeter right intraparotid node.     06/03/2020 -  Chemotherapy   The patient had rituximab for chemotherapy treatment.     08/31/2020 Imaging   CT neck 1. Regression of Leukemia. Resolved widespread cervical lymphadenopathy since August. Largest residual left level IIIb node now 8-9 mm short axis (previously 16 mm). 2.  CT Chest, Abdomen, and Pelvis today are reported separately.     08/31/2020 Imaging   Limited evaluation due to lack of intravenous contrast administration.   7.1 cm left perirenal soft tissue mass along the posterior left upper kidney, suspicious for perirenal lymphoma, although poorly evaluated. This is essentially new from May 2021.   Possible periureteral soft tissue along the right proximal collecting system/ureter, equivocal.   New 12 mm right lower lobe pulmonary nodule, suspicious for metastasis/pulmonary lymphoma. Additional 6 mm (mean diameter) subpleural nodule in the lingula is new from 2016, indeterminate.   No suspicious lymphadenopathy in the chest, abdomen, or pelvis. Spleen is normal in size.       REVIEW OF SYSTEMS:   Constitutional: Denies fevers, chills or abnormal weight loss Eyes: Denies blurriness of vision Ears, nose, mouth, throat, and face: Denies mucositis or sore throat Respiratory: Denies cough, dyspnea or wheezes Cardiovascular: Denies palpitation, chest discomfort or lower extremity swelling Gastrointestinal:  Denies nausea, heartburn or change in bowel habits Skin: Denies abnormal skin rashes Lymphatics: Denies new lymphadenopathy  Neurological:Denies numbness, tingling or new weaknesses Behavioral/Psych: Mood is stable, no new changes  All other systems were reviewed with the patient and are negative.  I have reviewed the past  medical history, past surgical history, social history and family history with the patient and they are unchanged from previous note.  ALLERGIES:  is allergic to lipitor [atorvastatin] and metformin.  MEDICATIONS:  Current Outpatient Medications  Medication Sig Dispense Refill  . acyclovir (ZOVIRAX) 400 MG tablet Take 1 tablet (400 mg total) by mouth daily. 30 tablet 3  . allopurinol (ZYLOPRIM) 300 MG tablet Take 300 mg by mouth daily.    Marland Kitchen amiodarone (PACERONE) 200 MG tablet TAKE 1 TABLET BY MOUTH TWICE A DAY FOR 2 WEEKS THEN REDUCE TO 1 TABLET DAILY. 60 tablet 0  . doxepin (SINEQUAN) 10 MG capsule Take 10 mg by mouth at bedtime.    Marland Kitchen glimepiride (AMARYL) 2 MG tablet Take 2 mg by mouth daily with breakfast.    . glucose blood test strip Dispense glucometer, strips and lancets preferred by patient's insurance (DX: E11.9 / type 2 DM)    . JARDIANCE 10 MG TABS tablet TAKE 1 TABLET BY MOUTH EVERY DAY (Patient not taking: No sig reported) 90 mg 3  . levothyroxine (SYNTHROID) 25 MCG tablet Take 25 mcg by mouth daily before breakfast.    . Needle, Disp, (HYPODERMIC NEEDLE 26GX5/8") 26G X 5/8" MISC See admin instructions.    . Omega-3 Fatty Acids (FISH OIL PO) Take 2 capsules by mouth daily.    . rivaroxaban (  XARELTO) 20 MG TABS tablet Take 1 tablet (20 mg total) by mouth daily with supper. 30 tablet 6  . rosuvastatin (CRESTOR) 10 MG tablet Take 10 mg by mouth daily.    . tamsulosin (FLOMAX) 0.4 MG CAPS capsule Take 0.4 mg by mouth daily.     No current facility-administered medications for this visit.    PHYSICAL EXAMINATION: ECOG PERFORMANCE STATUS: 1 - Symptomatic but completely ambulatory  Vitals:   01/06/21 0947  BP: (!) 145/57  Pulse: (!) 54  Resp: 18  Temp: (!) 97.4 F (36.3 C)  SpO2: 98%   Filed Weights   01/06/21 0947  Weight: (!) 317 lb 3.2 oz (143.9 kg)    GENERAL:alert, no distress and comfortable SKIN: Noted extensive skin bruises NEURO: alert & oriented x 3 with  fluent speech, no focal motor/sensory deficits  LABORATORY DATA:  I have reviewed the data as listed    Component Value Date/Time   NA 137 01/06/2021 0913   NA 138 10/25/2018 1120   NA 140 06/25/2015 0758   K 4.6 01/06/2021 0913   K 3.9 06/25/2015 0758   CL 107 01/06/2021 0913   CL 101 03/11/2013 1031   CO2 21 (L) 01/06/2021 0913   CO2 25 06/25/2015 0758   GLUCOSE 202 (H) 01/06/2021 0913   GLUCOSE 140 06/25/2015 0758   GLUCOSE 173 (H) 03/11/2013 1031   BUN 26 (H) 01/06/2021 0913   BUN 16 10/25/2018 1120   BUN 15.9 06/25/2015 0758   CREATININE 2.20 (H) 01/06/2021 0913   CREATININE 2.20 (H) 06/01/2020 0957   CREATININE 1.0 06/25/2015 0758   CALCIUM 8.8 (L) 01/06/2021 0913   CALCIUM 9.3 06/25/2015 0758   PROT 6.8 01/06/2021 0913   PROT 6.7 06/25/2015 0758   ALBUMIN 3.8 01/06/2021 0913   ALBUMIN 3.8 06/25/2015 0758   AST 14 (L) 01/06/2021 0913   AST 15 06/01/2020 0957   AST 15 06/25/2015 0758   ALT 12 01/06/2021 0913   ALT 11 06/01/2020 0957   ALT 14 06/25/2015 0758   ALKPHOS 75 01/06/2021 0913   ALKPHOS 61 06/25/2015 0758   BILITOT 0.5 01/06/2021 0913   BILITOT 0.8 06/01/2020 0957   BILITOT 0.95 06/25/2015 0758   GFRNONAA 30 (L) 01/06/2021 0913   GFRNONAA 28 (L) 06/01/2020 0957   GFRAA 29 (L) 06/09/2020 0847   GFRAA 32 (L) 06/01/2020 0957    No results found for: SPEP, UPEP  Lab Results  Component Value Date   WBC 7.5 01/06/2021   NEUTROABS 5.7 01/06/2021   HGB 12.7 (L) 01/06/2021   HCT 37.7 (L) 01/06/2021   MCV 90.0 01/06/2021   PLT 146 (L) 01/06/2021      Chemistry      Component Value Date/Time   NA 137 01/06/2021 0913   NA 138 10/25/2018 1120   NA 140 06/25/2015 0758   K 4.6 01/06/2021 0913   K 3.9 06/25/2015 0758   CL 107 01/06/2021 0913   CL 101 03/11/2013 1031   CO2 21 (L) 01/06/2021 0913   CO2 25 06/25/2015 0758   BUN 26 (H) 01/06/2021 0913   BUN 16 10/25/2018 1120   BUN 15.9 06/25/2015 0758   CREATININE 2.20 (H) 01/06/2021 0913    CREATININE 2.20 (H) 06/01/2020 0957   CREATININE 1.0 06/25/2015 0758      Component Value Date/Time   CALCIUM 8.8 (L) 01/06/2021 0913   CALCIUM 9.3 06/25/2015 0758   ALKPHOS 75 01/06/2021 0913   ALKPHOS 61 06/25/2015 0758  AST 14 (L) 01/06/2021 0913   AST 15 06/01/2020 0957   AST 15 06/25/2015 0758   ALT 12 01/06/2021 0913   ALT 11 06/01/2020 0957   ALT 14 06/25/2015 0758   BILITOT 0.5 01/06/2021 0913   BILITOT 0.8 06/01/2020 0957   BILITOT 0.95 06/25/2015 0758       RADIOGRAPHIC STUDIES: I have personally reviewed the radiological images as listed and agreed with the findings in the report. DG Foot Complete Left  Result Date: 12/17/2020 Please see detailed radiograph report in office note.  DG Foot Complete Right  Result Date: 12/17/2020 Please see detailed radiograph report in office note.

## 2021-01-06 NOTE — Assessment & Plan Note (Addendum)
Due to his renal failure, he will not get IV contrast with CT imaging We discussed the importance of adequate hydration and risk factor modification His treatment does not require dose adjustment with renal dysfunction

## 2021-01-06 NOTE — Progress Notes (Signed)
Per Dr. Alvy Bimler, patient has chronic kidney disease; ok to proceed with treatment with pending CMP.

## 2021-01-06 NOTE — Assessment & Plan Note (Signed)
The cause of his pancytopenia is multifactorial Anemia is likely anemia of chronic illness and related to chronic kidney disease His mild thrombocytopenia is likely due to fatty liver disease Observe closely for now

## 2021-01-07 NOTE — Progress Notes (Signed)
Primary Care Physician: Wenda Low, MD Primary Cardiologist: Dr Marlou Porch Primary Electrophysiologist: none Referring Physician: Dr Briscoe Burns is a 80 y.o. male with a history of DM, HTN, CAD, CLL, HLD, atrial flutter, and atrial fibrillation who presents for consultation in the [facility]. The patient was initially diagnosed with atrial flutter 2019 and underwent TEE/DCCV on 09/05/18. He was in afib on follow up and was started on amiodarone and had repeat DCCV on 10/2018. He started having symptoms of dizziness, SOB, and fatigue. His smart watch showed elevated heart rates. There were no specific triggers that he could identify. He has a CHADS2VASC score of 5 but unfortunately has only been taking Eliquis 5 mg once daily. His anticoagulation has been changed to Xarelto. He is s/p TEE guided DCCV on 08/28/20 and repeat DCCV on 11/30/20. Of note, he tested positive for COVID on 11/17/20. He does admit to snoring and daytime somnolence.  On follow up today, patient reports that he was diagnosed with a UTI and just finished abx treatment. His fatigue has improved but not back to baseline. He had an echo this morning and results are pending. He denies any bleeding issues on anticoagulation. He has decreased his alcohol intake to 10 drinks per week.   Today, he denies symptoms of palpitations, chest pain, orthopnea, PND, lower extremity edema, presyncope, syncope, bleeding, or neurologic sequela. The patient is tolerating medications without difficulties and is otherwise without complaint today.    Atrial Fibrillation Risk Factors:  he does have symptoms or diagnosis of sleep apnea. he does not have a history of rheumatic fever. he does have a history of alcohol use. The patient does not have a history of early familial atrial fibrillation or other arrhythmias.  he has a BMI of Body mass index is 39.8 kg/m.Marland Kitchen Filed Weights   01/08/21 1004  Weight: (!) 148.3  kg    Family History  Problem Relation Age of Onset  . Heart attack Mother   . Cancer Father        liver     Atrial Fibrillation Management history:  Previous antiarrhythmic drugs: amiodarone  Previous cardioversions: 08/2018, 10/2018, 08/28/20, 11/30/20 Previous ablations: none CHADS2VASC score: 5 Anticoagulation history: Eliquis, Xarelto    Past Medical History:  Diagnosis Date  . Blood dyscrasia    cll remission  . 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 Diabetes mellitus, type II ([place])   . Dilated cardiomyopathy ([place]) 09/14/2018   AFlutter >> EF 40-45 pre DCCV and 45-50 post DCCV in 08/2018 // probable tachycardia mediated.  Marland Kitchen GERD (gastroesophageal reflux disease)    occ tums  . H/O cardiac catheterization   . H/O exercise stress test 1999, 2002  . Hypertension   . Hypertriglyceridemia   . Lymphocytosis   . Obesity   . Pulmonary Function Test 09/2018   PFTs 09/2018:  FEV1 88% predicted; FEV1/FVC 78%; DLCO cor 58   Past Surgical History:  Procedure Laterality Date  . CARDIOVERSION N/A 09/05/2018   Procedure: CARDIOVERSION;  Surgeon: Jerline Pain, MD;  Location: [place] [facility];  Service: Cardiovascular;  Laterality: N/A;  . CARDIOVERSION N/A 10/22/2018   Procedure: CARDIOVERSION;  Surgeon: Jerline Pain, MD;  Location: [facility] ENDOSCOPY;  Service: Cardiovascular;  Laterality: N/A;  . CARDIOVERSION N/A 08/28/2020   Procedure: CARDIOVERSION;  Surgeon: Josue [name], MD;  Location: [facility] ENDOSCOPY;  Service: Cardiovascular;  Laterality: N/A;  . CARDIOVERSION N/A 11/30/2020   Procedure: CARDIOVERSION;  Surgeon: Jerline Pain, MD;  Location: [facility] ENDOSCOPY;  Service: Cardiovascular;  Laterality: N/A;  . CHOLECYSTECTOMY N/A 07/12/2016   Procedure: LAPAROSCOPIC CHOLECYSTECTOMY;  Surgeon: Donnie Mesa, MD;  Location: [place];  Service: General;  Laterality: N/A;  . EXTRACORPOREAL SHOCK WAVE LITHOTRIPSY Right 11/18/2019   Procedure: EXTRACORPOREAL SHOCK WAVE LITHOTRIPSY  (ESWL);  Surgeon: Lucas Mallow, MD;  Location: [facility];  Service: Urology;  Laterality: Right;  . FOOT SURGERY Right   . NASAL SINUS SURGERY    . TEE WITHOUT CARDIOVERSION N/A 09/05/2018   Procedure: TRANSESOPHAGEAL ECHOCARDIOGRAM (TEE);  Surgeon: Jerline Pain, MD;  Location: [facility] ENDOSCOPY;  Service: Cardiovascular;  Laterality: N/A;  . TEE WITHOUT CARDIOVERSION N/A 08/28/2020   Procedure: TRANSESOPHAGEAL ECHOCARDIOGRAM (TEE);  Surgeon: Josue [name], MD;  Location: [facility] ENDOSCOPY;  Service: Cardiovascular;  Laterality: N/A;  . TONSILLECTOMY    . UMBILICAL HERNIA REPAIR N/A 07/12/2016   Procedure: [place];  Surgeon: Donnie Mesa, MD;  Location: [place];  Service: General;  Laterality: N/A;  . vericose vein stripping      Current Outpatient Medications  Medication Sig Dispense Refill  . acyclovir (ZOVIRAX) 400 MG tablet Take 1 tablet (400 mg total) by mouth daily. 30 tablet 3  . allopurinol (ZYLOPRIM) 300 MG tablet Take 300 mg by mouth daily.    Marland Kitchen amiodarone (PACERONE) 200 MG tablet TAKE 1 TABLET BY MOUTH TWICE A DAY FOR 2 WEEKS THEN REDUCE TO 1 TABLET DAILY. 60 tablet 0  . doxepin (SINEQUAN) 10 MG capsule Take 10 mg by mouth at bedtime.    Marland Kitchen glimepiride (AMARYL) 2 MG tablet Take 2 mg by mouth daily with breakfast.    . glucose blood test strip Dispense glucometer, strips and lancets preferred by patient's insurance (DX: E11.9 / type 2 DM)    . JARDIANCE 10 MG TABS tablet TAKE 1 TABLET BY MOUTH EVERY DAY 90 mg 3  . levothyroxine (SYNTHROID) 25 MCG tablet Take 25 mcg by mouth daily before breakfast.    . Needle, Disp, (HYPODERMIC NEEDLE 26GX5/8") 26G X 5/8" MISC See admin instructions.    . Omega-3 Fatty Acids (FISH OIL PO) Take 2 capsules by mouth daily.    . rivaroxaban (XARELTO) 20 MG TABS tablet Take 1 tablet (20 mg total) by mouth daily with supper. 30 tablet 6  . rosuvastatin (CRESTOR) 10 MG tablet Take 10 mg by mouth daily.    . tamsulosin  (FLOMAX) 0.4 MG CAPS capsule Take 0.4 mg by mouth daily.     No current facility-administered medications for this encounter.    Allergies  Allergen Reactions  . Lipitor [Atorvastatin] Rash  . Metformin Diarrhea    Social History   Socioeconomic History  . Marital status: Married    Spouse name: Not on file  . Number of children: 3  . Years of education: Not on file  . Highest education level: Not on file  Occupational History    Employer: [employer]    Comment: retied sale; asphalt work   Tobacco Use  . Smoking status: Former Smoker    Packs/day: 3.00    Years: 10.00    Pack years: 30.00    Quit date: 09/19/1988    Years since quitting: 32.3  . Smokeless tobacco: Never Used  Vaping Use  . Vaping Use: Never used  Substance and Sexual Activity  . Alcohol use: Yes    Alcohol/week: 17.0 - 19.0 standard drinks    Types: 14 Shots  of liquor, 3 - 5 Standard drinks or equivalent per week  . Drug use: No  . Sexual activity: Not on file  Other Topics Concern  . Not on file  Social History Narrative  . Not on file   Social Determinants of Health   Financial Resource Strain: Not on file  Food Insecurity: Not on file  Transportation Needs: Not on file  Physical Activity: Not on file  Stress: Not on file  Social Connections: Not on file  Intimate Partner Violence: Not on file     ROS- All systems are reviewed and negative except as per the HPI above.  Physical Exam: Vitals:   01/08/21 1004  BP: 138/68  Pulse: (!) 52  Weight: (!) 148.3 kg  Height: 6\' 4"  (1.93 m)    GEN- The patient is a well appearing obese elderly male, alert and oriented x 3 today.   HEENT-head normocephalic, atraumatic, sclera clear, conjunctiva pink, hearing intact, trachea midline. Lungs- Clear to ausculation bilaterally, normal work of breathing Heart- Regular rate and rhythm, bradycardia, no murmurs, rubs or gallops  GI- soft, NT, ND, + BS Extremities- no clubbing, cyanosis, or edema MS-  no significant deformity or atrophy Skin- no rash or lesion Psych- euthymic mood, full affect Neuro- strength and sensation are intact   Wt Readings from Last 3 Encounters:  01/08/21 (!) 148.3 kg  01/06/21 (!) 143.9 kg  12/09/20 (!) 142.8 kg    EKG today demonstrates  SB, 1st degree AV block, inc LBBB Vent. rate 52 BPM PR interval 266 ms QRS duration 120 ms QT/QTcB 456/424 ms  TEE 09/05/18 demonstrated  - Left ventricle: The cavity size was normal. Wall thickness was  normal. Systolic function was mildly to moderately reduced. The  estimated ejection fraction was in the range of 40% to 45%.  Hypokinesis of the anteroseptal myocardium.  - Aortic valve: No evidence of vegetation.  - Mitral valve: No evidence of vegetation. There was mild  regurgitation.  - Left atrium: The atrium was dilated. No evidence of thrombus in  the appendage. No evidence of thrombus in the atrial cavity or  appendage.  - Right atrium: No evidence of thrombus in the atrial cavity or  appendage.  - Atrial septum: There was increased thickness of the septum,  consistent with lipomatous hypertrophy. No defect or patent  foramen ovale was identified.  - Tricuspid valve: No evidence of vegetation.  - Pulmonic valve: No evidence of vegetation.  - Superior vena cava: The study excluded a thrombus.   Impressions:   - Post cardioversion, EF appeared improved 45-50%   Epic records are reviewed at length today  CHA2DS2-VASc Score = 5  The patient's score is based upon: CHF History: No HTN History: Yes Diabetes History: Yes Stroke History: No Vascular Disease History: Yes Age Score: 2 Gender Score: 0      ASSESSMENT AND PLAN: 1. Persistent Atrial Fibrillation/atrial flutter The patient's CHA2DS2-VASc score is 5, indicating a 7.2% annual risk of stroke.   Patient appears to be maintaining SR. Will stop metoprolol given bradycardia and fatigue, patient did not stop after last  visit.  Echocardiogram results pending. Continue Xarelto 20 mg daily Continue amiodarone 200 mg daily    2. Secondary Hypercoagulable State (ICD10:  D68.69) The patient is at significant risk for stroke/thromboembolism based upon his CHA2DS2-VASc Score of 5.  Continue Rivaroxaban (Xarelto).   3. Obesity Body mass index is 39.8 kg/m. Lifestyle modification was discussed and encouraged including regular physical activity and  weight reduction.  4. CAD No anginal symptoms.  5. HTN Stable, no changes today.  6. Snoring/daytime somnolence Patient cancelled sleep study. Declined to reschedule, doesn't feel he needs it. The importance of adequate treatment of sleep apnea was discussed today in order to improve our ability to maintain sinus rhythm long term. Patient voices understanding.  7. Fatigue Somewhat improved with treatment of UTI.  Echo pending.  Patient declined sleep study.    Follow up with Dr Marlou Porch per recall. AF clinic in 6 months.    [facility] [address] [place], [place] [address] [phone] 01/08/2021 10:19 AM

## 2021-01-08 ENCOUNTER — Ambulatory Visit (HOSPITAL_COMMUNITY)
Admission: RE | Admit: 2021-01-08 | Discharge: 2021-01-08 | Disposition: A | Discharge status: Home / Self Care | Payer: Medicare Other | Source: Ambulatory Visit | Attending: Physician Assistant | Admitting: Physician Assistant

## 2021-01-08 ENCOUNTER — Ambulatory Visit (HOSPITAL_BASED_OUTPATIENT_CLINIC_OR_DEPARTMENT_OTHER)
Admission: RE | Admit: 2021-01-08 | Discharge: 2021-01-08 | Disposition: A | Discharge status: Home / Self Care | Payer: Medicare Other | Source: Ambulatory Visit | Attending: Physician Assistant | Admitting: Physician Assistant

## 2021-01-08 ENCOUNTER — Other Ambulatory Visit: Payer: Self-pay

## 2021-01-08 ENCOUNTER — Encounter (HOSPITAL_COMMUNITY): Payer: Self-pay | Admitting: Physician Assistant

## 2021-01-08 VITALS — BP 138/68 | HR 52 | Ht 76.0 in | Wt 327.0 lb

## 2021-01-08 DIAGNOSIS — D6869 Other thrombophilia: Secondary | ICD-10-CM

## 2021-01-08 DIAGNOSIS — I4819 Other persistent atrial fibrillation: Secondary | ICD-10-CM

## 2021-01-08 DIAGNOSIS — R0602 Shortness of breath: Secondary | ICD-10-CM | POA: Diagnosis not present

## 2021-01-08 DIAGNOSIS — I351 Nonrheumatic aortic (valve) insufficiency: Secondary | ICD-10-CM | POA: Insufficient documentation

## 2021-01-08 DIAGNOSIS — I2510 Atherosclerotic heart disease of native coronary artery without angina pectoris: Secondary | ICD-10-CM | POA: Insufficient documentation

## 2021-01-08 DIAGNOSIS — E785 Hyperlipidemia, unspecified: Secondary | ICD-10-CM | POA: Diagnosis not present

## 2021-01-08 DIAGNOSIS — I10 Essential (primary) hypertension: Secondary | ICD-10-CM | POA: Diagnosis not present

## 2021-01-08 LAB — ECHOCARDIOGRAM COMPLETE
Area-P 1/2: 3.6 cm2
S' Lateral: 4 cm
Single Plane A4C EF: 57.7 %

## 2021-01-08 NOTE — Progress Notes (Signed)
  Echocardiogram 2D Echocardiogram has been performed.  Derek [name] 01/08/2021, 9:45 AM

## 2021-01-08 NOTE — Patient Instructions (Signed)
Stop metoprolol

## 2021-01-15 ENCOUNTER — Encounter (HOSPITAL_COMMUNITY): Payer: Self-pay | Admitting: *Deleted

## 2021-01-15 DIAGNOSIS — N3021 Other chronic cystitis with hematuria: Secondary | ICD-10-CM | POA: Diagnosis not present

## 2021-01-21 ENCOUNTER — Other Ambulatory Visit (HOSPITAL_COMMUNITY): Payer: Self-pay

## 2021-01-21 MED ORDER — AMIODARONE HCL 200 MG PO TABS: 200.0000 mg | ORAL_TABLET | Freq: Every day | ORAL | 3 refills | Status: DC

## 2021-02-01 ENCOUNTER — Other Ambulatory Visit: Payer: Self-pay | Admitting: Hematology and Oncology

## 2021-02-01 DIAGNOSIS — Z7189 Other specified counseling: Secondary | ICD-10-CM

## 2021-02-01 DIAGNOSIS — C9110 Chronic lymphocytic leukemia of B-cell type not having achieved remission: Secondary | ICD-10-CM

## 2021-02-04 DIAGNOSIS — E782 Mixed hyperlipidemia: Secondary | ICD-10-CM | POA: Diagnosis not present

## 2021-02-04 DIAGNOSIS — N183 Chronic kidney disease, stage 3 unspecified: Secondary | ICD-10-CM | POA: Diagnosis not present

## 2021-02-04 DIAGNOSIS — E1122 Type 2 diabetes mellitus with diabetic chronic kidney disease: Secondary | ICD-10-CM | POA: Diagnosis not present

## 2021-02-04 DIAGNOSIS — E785 Hyperlipidemia, unspecified: Secondary | ICD-10-CM | POA: Diagnosis not present

## 2021-02-04 DIAGNOSIS — N400 Enlarged prostate without lower urinary tract symptoms: Secondary | ICD-10-CM | POA: Diagnosis not present

## 2021-02-04 DIAGNOSIS — I10 Essential (primary) hypertension: Secondary | ICD-10-CM | POA: Diagnosis not present

## 2021-02-04 DIAGNOSIS — E119 Type 2 diabetes mellitus without complications: Secondary | ICD-10-CM | POA: Diagnosis not present

## 2021-02-04 DIAGNOSIS — E039 Hypothyroidism, unspecified: Secondary | ICD-10-CM | POA: Diagnosis not present

## 2021-02-04 DIAGNOSIS — N182 Chronic kidney disease, stage 2 (mild): Secondary | ICD-10-CM | POA: Diagnosis not present

## 2021-02-04 DIAGNOSIS — I2510 Atherosclerotic heart disease of native coronary artery without angina pectoris: Secondary | ICD-10-CM | POA: Diagnosis not present

## 2021-02-05 ENCOUNTER — Ambulatory Visit (HOSPITAL_COMMUNITY)
Admission: RE | Admit: 2021-02-05 | Discharge: 2021-02-05 | Disposition: A | Discharge status: Home / Self Care | Payer: Medicare Other | Source: Ambulatory Visit | Attending: Hematology and Oncology | Admitting: Hematology and Oncology

## 2021-02-05 ENCOUNTER — Other Ambulatory Visit: Payer: Self-pay

## 2021-02-05 DIAGNOSIS — N2889 Other specified disorders of kidney and ureter: Secondary | ICD-10-CM | POA: Diagnosis not present

## 2021-02-05 DIAGNOSIS — N184 Chronic kidney disease, stage 4 (severe): Secondary | ICD-10-CM | POA: Diagnosis not present

## 2021-02-05 DIAGNOSIS — K5750 Diverticulosis of both small and large intestine without perforation or abscess without bleeding: Secondary | ICD-10-CM | POA: Diagnosis not present

## 2021-02-05 DIAGNOSIS — I2510 Atherosclerotic heart disease of native coronary artery without angina pectoris: Secondary | ICD-10-CM | POA: Diagnosis not present

## 2021-02-05 DIAGNOSIS — J84112 Idiopathic pulmonary fibrosis: Secondary | ICD-10-CM | POA: Diagnosis not present

## 2021-02-05 DIAGNOSIS — N261 Atrophy of kidney (terminal): Secondary | ICD-10-CM | POA: Diagnosis not present

## 2021-02-05 DIAGNOSIS — C9110 Chronic lymphocytic leukemia of B-cell type not having achieved remission: Secondary | ICD-10-CM | POA: Diagnosis not present

## 2021-02-05 DIAGNOSIS — J479 Bronchiectasis, uncomplicated: Secondary | ICD-10-CM | POA: Diagnosis not present

## 2021-02-08 ENCOUNTER — Encounter: Payer: Self-pay | Admitting: Hematology and Oncology

## 2021-02-08 ENCOUNTER — Other Ambulatory Visit: Payer: Self-pay

## 2021-02-08 ENCOUNTER — Inpatient Hospital Stay: Payer: Medicare Other | Attending: Hematology and Oncology | Admitting: Hematology and Oncology

## 2021-02-08 DIAGNOSIS — R911 Solitary pulmonary nodule: Secondary | ICD-10-CM | POA: Insufficient documentation

## 2021-02-08 DIAGNOSIS — R590 Localized enlarged lymph nodes: Secondary | ICD-10-CM

## 2021-02-08 DIAGNOSIS — I700 Atherosclerosis of aorta: Secondary | ICD-10-CM | POA: Diagnosis not present

## 2021-02-08 DIAGNOSIS — J479 Bronchiectasis, uncomplicated: Secondary | ICD-10-CM | POA: Diagnosis not present

## 2021-02-08 DIAGNOSIS — R918 Other nonspecific abnormal finding of lung field: Secondary | ICD-10-CM | POA: Diagnosis not present

## 2021-02-08 DIAGNOSIS — R599 Enlarged lymph nodes, unspecified: Secondary | ICD-10-CM | POA: Insufficient documentation

## 2021-02-08 DIAGNOSIS — Z79899 Other long term (current) drug therapy: Secondary | ICD-10-CM | POA: Diagnosis not present

## 2021-02-08 DIAGNOSIS — Z9049 Acquired absence of other specified parts of digestive tract: Secondary | ICD-10-CM | POA: Insufficient documentation

## 2021-02-08 DIAGNOSIS — K5730 Diverticulosis of large intestine without perforation or abscess without bleeding: Secondary | ICD-10-CM | POA: Insufficient documentation

## 2021-02-08 DIAGNOSIS — Z7901 Long term (current) use of anticoagulants: Secondary | ICD-10-CM | POA: Insufficient documentation

## 2021-02-08 DIAGNOSIS — Z87891 Personal history of nicotine dependence: Secondary | ICD-10-CM | POA: Insufficient documentation

## 2021-02-08 DIAGNOSIS — C9110 Chronic lymphocytic leukemia of B-cell type not having achieved remission: Secondary | ICD-10-CM | POA: Diagnosis not present

## 2021-02-08 DIAGNOSIS — J8410 Pulmonary fibrosis, unspecified: Secondary | ICD-10-CM | POA: Insufficient documentation

## 2021-02-08 DIAGNOSIS — I2510 Atherosclerotic heart disease of native coronary artery without angina pectoris: Secondary | ICD-10-CM | POA: Diagnosis not present

## 2021-02-08 DIAGNOSIS — I517 Cardiomegaly: Secondary | ICD-10-CM | POA: Diagnosis not present

## 2021-02-08 DIAGNOSIS — N200 Calculus of kidney: Secondary | ICD-10-CM | POA: Insufficient documentation

## 2021-02-08 DIAGNOSIS — I482 Chronic atrial fibrillation, unspecified: Secondary | ICD-10-CM | POA: Diagnosis not present

## 2021-02-08 NOTE — Assessment & Plan Note (Signed)
It is highly unusual to see CLL progress with subcutaneous nodules and lung masses This is either due to malignant transformation or we are dealing with a separate primary malignancy such as lung cancer I will order PET CT scan for staging for possible lymphoma involvement of the skin nodules The skin nodules are nonpalpable and could very well be related to simple skin infection He would need pulmonary evaluation and possible bronchoscopy with biopsy He is in agreement to proceed with the plan of care

## 2021-02-08 NOTE — Assessment & Plan Note (Signed)
According to the radiologist, the interstitial changes in the lung could be due to pulmonary fibrosis As above, I will refer him to see pulmonologist His oxygen saturation is normal; however, he does have shortness of breath on exertion which is difficult to assess given the fact that he has chronic atrial fibrillation and cardiomyopathy He is also on amiodarone long-term and that could sometimes cause pulmonary fibrosis

## 2021-02-08 NOTE — Progress Notes (Signed)
[facility] OFFICE PROGRESS NOTE  Patient Care Team: Wenda Low, MD as PCP - General (Internal Medicine) Jerline Pain, MD as PCP - Cardiology (Cardiology) Jerline Pain, MD as Attending Physician (Cardiology)  ASSESSMENT & PLAN:  CLL (chronic lymphocytic leukemia) It is highly unusual to see CLL progress with subcutaneous nodules and lung masses This is either due to malignant transformation or we are dealing with a separate primary malignancy such as lung cancer I will order PET CT scan for staging for possible lymphoma involvement of the skin nodules The skin nodules are nonpalpable and could very well be related to simple skin infection He would need pulmonary evaluation and possible bronchoscopy with biopsy He is in agreement to proceed with the plan of care  Mass of lower lobe of left lung The changes on the right lower lobe is not consistent with a mass, could be due to compression atelectasis However, pre-existing lung nodule at the base of the left lung has now grown significantly and I am concerned about possible diagnosis of lung cancer The patient used to smoke in the past I will order a PET CT scan for staging and will refer him to pulmonary for evaluation and biopsy  Pulmonary infiltrate on radiologic exam According to the radiologist, the interstitial changes in the lung could be due to pulmonary fibrosis As above, I will refer him to see pulmonologist His oxygen saturation is normal; however, he does have shortness of breath on exertion which is difficult to assess given the fact that he has chronic atrial fibrillation and cardiomyopathy He is also on amiodarone long-term and that could sometimes cause pulmonary fibrosis   Orders Placed This Encounter  Procedures  . NM PET Image Initial (PI) Skull Base To Thigh    Standing Status:   Future    Standing Expiration Date:   02/08/2022    Order Specific Question:   If indicated for the ordered  procedure, I authorize the administration of a radiopharmaceutical per Radiology protocol    Answer:   Yes    Order Specific Question:   Preferred imaging location?    Answer:   [facility]  . Ambulatory referral to Pulmonology    Referral Priority:   Urgent    Referral Type:   Consultation    Referral Reason:   Specialty Services Required    Requested Specialty:   Pulmonary Disease    Number of Visits Requested:   1    All questions were answered. The patient knows to call the clinic with any problems, questions or concerns. The total time spent in the appointment was 30 minutes encounter with patients including review of chart and various tests results, discussions about plan of care and coordination of care plan   [name], MD 02/08/2021 12:38 PM  INTERVAL HISTORY: Please see below for problem oriented charting. He returns with his wife for further follow-up He complained of shortness of breath on exertion No recent side effects of treatment Denies recent skin infection or palpable lymphadenopathy  SUMMARY OF ONCOLOGIC HISTORY: Oncology History Overview Note  Normal FISH   CLL (chronic lymphocytic leukemia) ([place])  09/10/2013 Initial Diagnosis   CLL (chronic lymphocytic leukemia)   12/26/2013 Pathology Results   FISH analysis was normal.   06/17/2014 Procedure   The patient has placement of Infuse-a-Port.   06/18/2014 Imaging   CT scan of the chest, abdomen and pelvis showed diffuse lymphadenopathy and splenomegaly.   06/19/2014 Bone Marrow Biopsy  Bone marrow aspirate and biopsy show CLL.   06/24/2014 - 11/18/2014 Chemotherapy   He received 6 cycles of Obinutuzumab and chlorambucil   09/15/2014 Imaging   Repeat CT scan of the chest, abdomen and pelvis show greater than 50% reduction in lymphadenopathy and splenomegaly   12/19/2014 Imaging   Repeat CT scan showed complete resolution of lymphadenopathy and splenomegaly.   03/09/2020 Cancer Staging   Staging form:  Chronic Lymphocytic Leukemia / Small Lymphocytic Lymphoma, AJCC 8th Edition - Clinical stage from 03/09/2020: Modified Rai Stage I (Modified Rai risk: Intermediate, Lymphocytosis: Present, Adenopathy: Present, Organomegaly: Absent, Anemia: Absent, Thrombocytopenia: Absent) - Signed by [name], MD on 03/09/2020   04/22/2020 Imaging   CT neck 1. Cervical adenopathy involving the bilateral level 1B, right 2/3 and bilateral level 5 nodal stations. 2. Prominent to mildly enlarged upper mediastinal nodes. 3. Prominent subcentimeter right intraparotid node.     06/03/2020 -  Chemotherapy   The patient had rituximab for chemotherapy treatment.     08/31/2020 Imaging   CT neck 1. Regression of Leukemia. Resolved widespread cervical lymphadenopathy since August. Largest residual left level IIIb node now 8-9 mm short axis (previously 16 mm). 2.  CT Chest, Abdomen, and Pelvis today are reported separately.     08/31/2020 Imaging   Limited evaluation due to lack of intravenous contrast administration.   7.1 cm left perirenal soft tissue mass along the posterior left upper kidney, suspicious for perirenal lymphoma, although poorly evaluated. This is essentially new from May 2021.   Possible periureteral soft tissue along the right proximal collecting system/ureter, equivocal.   New 12 mm right lower lobe pulmonary nodule, suspicious for metastasis/pulmonary lymphoma. Additional 6 mm (mean diameter) subpleural nodule in the lingula is new from 2016, indeterminate.   No suspicious lymphadenopathy in the chest, abdomen, or pelvis. Spleen is normal in size.     02/05/2021 Imaging   1. Interval enlargement of multiple subcutaneous soft tissue nodules or lymph nodes, in the left axilla and overlying the sacrum and left buttock. 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 2. Significant interval increase in size of right lower lobe and lingular pulmonary nodules, now with large subpleural  opacities, consistent with worsened pulmonary lymphomatous involvement. 3. Interval decrease in size of a left perinephric soft tissue mass.  4. Constellation of findings above suggest mixed response to treatment. 5. 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 No clear evidence of honeycombing. 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 [place], Iss 5, [phone], May 20 2017. 6. Coronary artery disease.     REVIEW OF SYSTEMS:   Constitutional: Denies fevers, chills or abnormal weight loss Eyes: Denies blurriness of vision Ears, nose, mouth, throat, and face: Denies mucositis or sore throat Respiratory: Denies cough, dyspnea or wheezes Cardiovascular: Denies palpitation, chest discomfort or lower extremity swelling Skin: Denies abnormal skin rashes Lymphatics: Denies new lymphadenopathy or easy bruising Neurological:Denies numbness, tingling or new weaknesses Behavioral/Psych: Mood is stable, no new changes  All other systems were reviewed with the patient and are negative.  I have reviewed the past medical history, past surgical history, social history and family history with the patient and they are unchanged from previous note.  ALLERGIES:  is  allergic to lipitor [atorvastatin] and metformin.  MEDICATIONS:  Current Outpatient Medications  Medication Sig Dispense Refill  . acyclovir (ZOVIRAX) 400 MG tablet Take 1 tablet (400 mg total) by mouth daily. 30 tablet 3  . allopurinol (ZYLOPRIM) 300 MG tablet TAKE 1 TABLET BY MOUTH EVERY DAY 90 tablet 1  . amiodarone (PACERONE) 200 MG tablet Take 1 tablet (200 mg total) by  mouth daily. 60 tablet 3  . doxepin (SINEQUAN) 10 MG capsule Take 10 mg by mouth at bedtime.    Marland Kitchen glimepiride (AMARYL) 2 MG tablet Take 2 mg by mouth daily with breakfast.    . glucose blood test strip Dispense glucometer, strips and lancets preferred by patient's insurance (DX: E11.9 / type 2 DM)    . JARDIANCE 10 MG TABS tablet TAKE 1 TABLET BY MOUTH EVERY DAY 90 mg 3  . levothyroxine (SYNTHROID) 25 MCG tablet Take 25 mcg by mouth daily before breakfast.    . Needle, Disp, (HYPODERMIC NEEDLE 26GX5/8") 26G X 5/8" MISC See admin instructions.    . Omega-3 Fatty Acids (FISH OIL PO) Take 2 capsules by mouth daily.    . rivaroxaban (XARELTO) 20 MG TABS tablet Take 1 tablet (20 mg total) by mouth daily with supper. 30 tablet 6  . rosuvastatin (CRESTOR) 10 MG tablet Take 10 mg by mouth daily.    . tamsulosin (FLOMAX) 0.4 MG CAPS capsule Take 0.4 mg by mouth daily.     No current facility-administered medications for this visit.    PHYSICAL EXAMINATION: ECOG PERFORMANCE STATUS: 0 - Asymptomatic  Vitals:   02/08/21 1209  BP: (!) 167/69  Pulse: (!) 54  Resp: 18  Temp: 98 F (36.7 C)  SpO2: 98%   Filed Weights   02/08/21 1209  Weight: (!) 319 lb (144.7 kg)    GENERAL:alert, no distress and comfortable SKIN: There is no palpable subcutaneous abnormality on the left axilla or the sacrum area NEURO: alert & oriented x 3 with fluent speech, no focal motor/sensory deficits  LABORATORY DATA:  I have reviewed the data as listed    Component Value Date/Time   NA 137 01/06/2021 0913   NA 138 10/25/2018 1120   NA 140 06/25/2015 0758   K 4.6 01/06/2021 0913   K 3.9 06/25/2015 0758   CL 107 01/06/2021 0913   CL 101 03/11/2013 1031   CO2 21 (L) 01/06/2021 0913   CO2 25 06/25/2015 0758   GLUCOSE 202 (H) 01/06/2021 0913   GLUCOSE 140 06/25/2015 0758   GLUCOSE 173 (H) 03/11/2013 1031   BUN 26 (H) 01/06/2021 0913   BUN 16 10/25/2018 1120   BUN 15.9 06/25/2015 0758   CREATININE 2.20 (H)  01/06/2021 0913   CREATININE 2.20 (H) 06/01/2020 0957   CREATININE 1.0 06/25/2015 0758   CALCIUM 8.8 (L) 01/06/2021 0913   CALCIUM 9.3 06/25/2015 0758   PROT 6.8 01/06/2021 0913   PROT 6.7 06/25/2015 0758   ALBUMIN 3.8 01/06/2021 0913   ALBUMIN 3.8 06/25/2015 0758   AST 14 (L) 01/06/2021 0913   AST 15 06/01/2020 0957   AST 15 06/25/2015 0758   ALT 12 01/06/2021 0913   ALT 11 06/01/2020 0957   ALT 14 06/25/2015 0758   ALKPHOS 75 01/06/2021 0913   ALKPHOS 61 06/25/2015 0758   BILITOT 0.5 01/06/2021 0913   BILITOT 0.8 06/01/2020 0957   BILITOT 0.95 06/25/2015 0758   GFRNONAA 30 (L) 01/06/2021 0913   GFRNONAA 28 (L) 06/01/2020 0957   GFRAA 29 (L)  06/09/2020 0847   GFRAA 32 (L) 06/01/2020 0957    No results found for: SPEP, UPEP  Lab Results  Component Value Date   WBC 7.5 01/06/2021   NEUTROABS 5.7 01/06/2021   HGB 12.7 (L) 01/06/2021   HCT 37.7 (L) 01/06/2021   MCV 90.0 01/06/2021   PLT 146 (L) 01/06/2021      Chemistry      Component Value Date/Time   NA 137 01/06/2021 0913   NA 138 10/25/2018 1120   NA 140 06/25/2015 0758   K 4.6 01/06/2021 0913   K 3.9 06/25/2015 0758   CL 107 01/06/2021 0913   CL 101 03/11/2013 1031   CO2 21 (L) 01/06/2021 0913   CO2 25 06/25/2015 0758   BUN 26 (H) 01/06/2021 0913   BUN 16 10/25/2018 1120   BUN 15.9 06/25/2015 0758   CREATININE 2.20 (H) 01/06/2021 0913   CREATININE 2.20 (H) 06/01/2020 0957   CREATININE 1.0 06/25/2015 0758      Component Value Date/Time   CALCIUM 8.8 (L) 01/06/2021 0913   CALCIUM 9.3 06/25/2015 0758   ALKPHOS 75 01/06/2021 0913   ALKPHOS 61 06/25/2015 0758   AST 14 (L) 01/06/2021 0913   AST 15 06/01/2020 0957   AST 15 06/25/2015 0758   ALT 12 01/06/2021 0913   ALT 11 06/01/2020 0957   ALT 14 06/25/2015 0758   BILITOT 0.5 01/06/2021 0913   BILITOT 0.8 06/01/2020 0957   BILITOT 0.95 06/25/2015 0758       RADIOGRAPHIC STUDIES: I have reviewed imaging study with the patient and his wife I have  personally reviewed the radiological images as listed and agreed with the findings in the report. CT Abdomen Pelvis Wo Contrast  Result Date: 02/06/2021 CLINICAL DATA:  CLL, assess treatment response, ongoing chemotherapy and immunotherapy EXAM: CT CHEST, ABDOMEN AND PELVIS WITHOUT CONTRAST TECHNIQUE: Multidetector CT imaging of the chest, abdomen and pelvis was performed following the standard protocol without IV contrast. COMPARISON:  08/31/2020 FINDINGS: CT CHEST FINDINGS Cardiovascular: Aortic atherosclerosis. Mild cardiomegaly. Three-vessel coronary artery calcifications. No pericardial effusion. Mediastinum/Nodes: No enlarged mediastinal or hilar lymph nodes. There is a newly enlarged left axillary lymph node or subcutaneous soft tissue nodule measuring 1.5 x 1.4 cm, previously no greater than 0.6 cm (series 2, image 23). Thyroid gland, trachea, and esophagus demonstrate no significant findings. 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 No clear evidence of honeycombing. Interval increase in size of pulmonary nodules, now with large subpleural opacities in the dependent right lung base measuring 7.9 x 4.1 cm (series 6, image 149) and in the lingula measuring 3.2 x 1.6 cm (series 6, image 125). No pleural effusion or pneumothorax. Musculoskeletal: No chest wall mass or suspicious bone lesions identified. CT ABDOMEN PELVIS FINDINGS Hepatobiliary: No focal liver abnormality is seen. Status post cholecystectomy. No biliary dilatation. Pancreas: Unremarkable. No pancreatic ductal dilatation or surrounding inflammatory changes. Spleen: Spleen is at the upper limit of normal in size, unchanged, measuring 13.1 cm maximum coronal span. Adrenals/Urinary Tract: Adrenal glands are unremarkable. Atrophic kidneys with extensive perinephric fat  stranding. A left-sided perinephric soft tissue mass is diminished in size compared to prior examination, measuring 4.4 x 2.7 cm, previously 7.1 x 4.2 cm (series 2, image 89). Multiple small nonobstructive right renal calculi, including a calculus in the dependent right renal pelvis measuring approximately 0.8 cm (series 2, image 88).  No hydronephrosis. Bladder is unremarkable. Stomach/Bowel: Stomach is within normal limits. Appendix appears normal. No evidence of bowel wall thickening, distention, or inflammatory changes. Descending and sigmoid diverticulosis. Vascular/Lymphatic: Aortic atherosclerosis. No enlarged abdominal or pelvic lymph nodes. Reproductive: No mass or other abnormality. Other: No abdominal wall hernia. Interval enlargement of multiple subcutaneous soft tissue nodules, overlying the right sacrum measuring 1.6 x 1.5 cm, previously 1.1 x 0.8 cm, and overlying the left buttock measuring 2.0 x 1.4 cm, previously no greater than 0.5 cm (series 2, image 105). No abdominopelvic ascites. Musculoskeletal: No acute or significant osseous findings. IMPRESSION: 1. Interval enlargement of multiple subcutaneous soft tissue nodules or lymph nodes, in the left axilla and overlying the sacrum and left buttock. 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 2. Significant interval increase in size of right lower lobe and lingular pulmonary nodules, now with large subpleural opacities, consistent with worsened pulmonary lymphomatous involvement. 3. Interval decrease in size of a left perinephric soft tissue mass. 4. Constellation of findings above suggest mixed response to treatment. 5. 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 No clear evidence of honeycombing. 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 Findings are categorized as  probable UIP per consensus guidelines: Diagnosis of Idiopathic Pulmonary Fibrosis: An Official ATS/ERS/JRS/ALAT Clinical Practice Guideline. [place], Iss 5, [phone], May 20 2017. 6. Coronary artery disease. Aortic Atherosclerosis (ICD10-I70.0). Electronically Signed   By: Eddie Candle M.D.   On: 02/06/2021 09:07   CT Chest Wo Contrast  Result Date: 02/06/2021 CLINICAL DATA:  CLL, assess treatment response, ongoing chemotherapy and immunotherapy EXAM: CT CHEST, ABDOMEN AND PELVIS WITHOUT CONTRAST TECHNIQUE: Multidetector CT imaging of the chest, abdomen and pelvis was performed following the standard protocol without IV contrast. COMPARISON:  08/31/2020 FINDINGS: CT CHEST FINDINGS Cardiovascular: Aortic atherosclerosis. Mild cardiomegaly. Three-vessel coronary artery calcifications. No pericardial effusion. Mediastinum/Nodes: No enlarged mediastinal or hilar lymph nodes. There is a newly enlarged left axillary lymph node or subcutaneous soft tissue nodule measuring 1.5 x 1.4 cm, previously no greater than 0.6 cm (series 2, image 23). Thyroid gland, trachea, and esophagus demonstrate no significant findings. 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 No clear evidence of honeycombing. Interval increase in size of pulmonary nodules, now with large subpleural opacities in the dependent right lung base measuring 7.9 x 4.1 cm (series 6, image 149) and in the lingula measuring 3.2 x 1.6 cm (series 6, image 125). No pleural effusion or pneumothorax. Musculoskeletal: No chest wall mass or suspicious bone lesions identified. CT ABDOMEN PELVIS FINDINGS Hepatobiliary: No focal liver abnormality is seen. Status post cholecystectomy. No biliary dilatation. Pancreas: Unremarkable. No pancreatic ductal dilatation or surrounding  inflammatory changes. Spleen: Spleen is at the upper limit of normal in size, unchanged, measuring 13.1 cm maximum coronal span. Adrenals/Urinary Tract: Adrenal glands are unremarkable. Atrophic kidneys with extensive perinephric fat stranding. A left-sided perinephric soft tissue mass is diminished in size compared to prior examination, measuring 4.4 x 2.7 cm, previously 7.1 x 4.2 cm (series 2, image 89). Multiple small nonobstructive right renal calculi, including a calculus in the dependent right renal pelvis measuring approximately 0.8 cm (series 2, image 88). No hydronephrosis. Bladder is unremarkable. Stomach/Bowel: Stomach is within normal limits. Appendix appears  normal. No evidence of bowel wall thickening, distention, or inflammatory changes. Descending and sigmoid diverticulosis. Vascular/Lymphatic: Aortic atherosclerosis. No enlarged abdominal or pelvic lymph nodes. Reproductive: No mass or other abnormality. Other: No abdominal wall hernia. Interval enlargement of multiple subcutaneous soft tissue nodules, overlying the right sacrum measuring 1.6 x 1.5 cm, previously 1.1 x 0.8 cm, and overlying the left buttock measuring 2.0 x 1.4 cm, previously no greater than 0.5 cm (series 2, image 105). No abdominopelvic ascites. Musculoskeletal: No acute or significant osseous findings. IMPRESSION: 1. Interval enlargement of multiple subcutaneous soft tissue nodules or lymph nodes, in the left axilla and overlying the sacrum and left buttock. 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 2. Significant interval increase in size of right lower lobe and lingular pulmonary nodules, now with large subpleural opacities, consistent with worsened pulmonary lymphomatous involvement. 3. Interval decrease in size of a left perinephric soft tissue mass. 4. Constellation of findings above suggest mixed response to treatment. 5. 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 No clear evidence of honeycombing. 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 [place], Iss 5, [phone], May 20 2017. 6. Coronary artery disease. Aortic Atherosclerosis (ICD10-I70.0). Electronically Signed   By: Eddie Candle M.D.   On: 02/06/2021 09:07

## 2021-02-08 NOTE — Assessment & Plan Note (Signed)
The changes on the right lower lobe is not consistent with a mass, could be due to compression atelectasis However, pre-existing lung nodule at the base of the left lung has now grown significantly and I am concerned about possible diagnosis of lung cancer The patient used to smoke in the past I will order a PET CT scan for staging and will refer him to pulmonary for evaluation and biopsy

## 2021-02-09 ENCOUNTER — Telehealth: Payer: Self-pay

## 2021-02-09 ENCOUNTER — Encounter (HOSPITAL_BASED_OUTPATIENT_CLINIC_OR_DEPARTMENT_OTHER): Payer: Medicare Other | Admitting: Cardiovascular Disease

## 2021-02-09 NOTE — Telephone Encounter (Signed)
-----   Message from [name], MD sent at 02/09/2021 12:49 PM EDT ----- Pls call pt/wife I can see him on 6/7 at 130 pm, 30 mins to review PET CT result

## 2021-02-09 NOTE — Telephone Encounter (Signed)
Called and left below message. Ask him to call the office back. ?

## 2021-02-10 ENCOUNTER — Telehealth: Payer: Self-pay

## 2021-02-10 ENCOUNTER — Encounter: Payer: Self-pay | Admitting: Hematology and Oncology

## 2021-02-10 NOTE — Telephone Encounter (Signed)
Called and left below message. 

## 2021-02-10 NOTE — Telephone Encounter (Signed)
Pt's friend, Mateo Flow called and LVM stating she was calling us back . This LPN attempted to return call to pt to schedule appt per Dr Alvy Bimler. LVM for Mateo Flow to return call to office.

## 2021-02-12 DIAGNOSIS — E782 Mixed hyperlipidemia: Secondary | ICD-10-CM | POA: Diagnosis not present

## 2021-02-12 DIAGNOSIS — C9110 Chronic lymphocytic leukemia of B-cell type not having achieved remission: Secondary | ICD-10-CM | POA: Diagnosis not present

## 2021-02-12 DIAGNOSIS — I2510 Atherosclerotic heart disease of native coronary artery without angina pectoris: Secondary | ICD-10-CM | POA: Diagnosis not present

## 2021-02-12 DIAGNOSIS — Z7984 Long term (current) use of oral hypoglycemic drugs: Secondary | ICD-10-CM | POA: Diagnosis not present

## 2021-02-12 DIAGNOSIS — N183 Chronic kidney disease, stage 3 unspecified: Secondary | ICD-10-CM | POA: Diagnosis not present

## 2021-02-12 DIAGNOSIS — N400 Enlarged prostate without lower urinary tract symptoms: Secondary | ICD-10-CM | POA: Diagnosis not present

## 2021-02-12 DIAGNOSIS — Z1389 Encounter for screening for other disorder: Secondary | ICD-10-CM | POA: Diagnosis not present

## 2021-02-12 DIAGNOSIS — I4892 Unspecified atrial flutter: Secondary | ICD-10-CM | POA: Diagnosis not present

## 2021-02-12 DIAGNOSIS — I10 Essential (primary) hypertension: Secondary | ICD-10-CM | POA: Diagnosis not present

## 2021-02-12 DIAGNOSIS — I872 Venous insufficiency (chronic) (peripheral): Secondary | ICD-10-CM | POA: Diagnosis not present

## 2021-02-12 DIAGNOSIS — E1122 Type 2 diabetes mellitus with diabetic chronic kidney disease: Secondary | ICD-10-CM | POA: Diagnosis not present

## 2021-02-12 DIAGNOSIS — Z0000 Encounter for general adult medical examination without abnormal findings: Secondary | ICD-10-CM | POA: Diagnosis not present

## 2021-02-12 DIAGNOSIS — E039 Hypothyroidism, unspecified: Secondary | ICD-10-CM | POA: Diagnosis not present

## 2021-02-12 DIAGNOSIS — E349 Endocrine disorder, unspecified: Secondary | ICD-10-CM | POA: Diagnosis not present

## 2021-02-17 ENCOUNTER — Telehealth: Payer: Self-pay

## 2021-02-17 NOTE — Telephone Encounter (Signed)
Left a message for him to call the office back to schedule appt. Left a message asking Mateo Flow to call the office back.

## 2021-02-17 NOTE — Telephone Encounter (Signed)
He called back. Appt scheduled on 6/14 at 8am. He is aware of appt times.

## 2021-02-22 ENCOUNTER — Ambulatory Visit (HOSPITAL_COMMUNITY): Admission: RE | Admit: 2021-02-22 | Payer: Medicare Other | Source: Ambulatory Visit

## 2021-02-22 DIAGNOSIS — D4102 Neoplasm of uncertain behavior of left kidney: Secondary | ICD-10-CM | POA: Diagnosis not present

## 2021-02-22 DIAGNOSIS — N5201 Erectile dysfunction due to arterial insufficiency: Secondary | ICD-10-CM | POA: Diagnosis not present

## 2021-02-22 DIAGNOSIS — R350 Frequency of micturition: Secondary | ICD-10-CM | POA: Diagnosis not present

## 2021-02-23 ENCOUNTER — Other Ambulatory Visit: Payer: Self-pay

## 2021-02-23 ENCOUNTER — Ambulatory Visit (INDEPENDENT_AMBULATORY_CARE_PROVIDER_SITE_OTHER): Payer: Medicare Other | Admitting: Emergency Medicine

## 2021-02-23 ENCOUNTER — Encounter: Payer: Self-pay | Admitting: Emergency Medicine

## 2021-02-23 VITALS — BP 128/84 | HR 56 | Temp 97.6°F | Ht 73.0 in | Wt 326.6 lb

## 2021-02-23 DIAGNOSIS — R918 Other nonspecific abnormal finding of lung field: Secondary | ICD-10-CM

## 2021-02-23 DIAGNOSIS — R0609 Other forms of dyspnea: Secondary | ICD-10-CM

## 2021-02-23 DIAGNOSIS — R0600 Dyspnea, unspecified: Secondary | ICD-10-CM | POA: Diagnosis not present

## 2021-02-23 DIAGNOSIS — R9389 Abnormal findings on diagnostic imaging of other specified body structures: Secondary | ICD-10-CM | POA: Insufficient documentation

## 2021-02-23 NOTE — Patient Instructions (Addendum)
Get your PET scan as planned. Dr. [name] work on coordinating biopsy of 1 or both of your lung nodules Try using your albuterol 2 puffs if needed for shortness of breath.  Keep track of whether you get benefit from this.  We may decide to start an every day inhaler at some point in the future.  Follow with Dr [name] in 1 month

## 2021-02-23 NOTE — Assessment & Plan Note (Signed)
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  Unclear whether there is a significant component of COPD here but he is willing to do a trial of albuterol to see if he gets benefit.  If so then we could consider long-acting bronchodilator therapy.  He may benefit ultimately from repeat PFT

## 2021-02-23 NOTE — Assessment & Plan Note (Signed)
With enlargement of pleural-based right lower lobe, pleural-based lingular nodules.  This despite decrease in size of his cervical lymphadenopathy while receiving chemotherapy and rituximab through April.  Suspect that this is some sort of lymphomatous conversion that failed to respond.  He has subcutaneous nodules as well noted on CT.  I agree that he needs biopsy to determine tissue type.  Discussed possible bronchoscopy but I think transthoracic needle aspiration would probably be higher yield as it will give a core biopsy and give architecture.  I talked to him about the pros and cons of each procedure.  I also reviewed the case with Dr. Vernard Gambles with interventional radiology who agrees that needle biopsy is feasible.  The patient has a PET scan later this week and we will see if this scan modifies the plan or helps with biopsy targeting.  Get your PET scan as planned. Dr. [name] work on coordinating biopsy of 1 or both of your lung nodules Follow with Dr [name] in 1 month

## 2021-02-23 NOTE — Progress Notes (Signed)
Subjective:    Patient ID: Derek [name], male    DOB: 12-27-1940, 80 y.o.   MRN: [id number]  HPI 80 year old gentleman, former smoker (30 pack years) with a history of diabetes, hypertension, atrial flutter with a systolic dilated cardiomyopathy.  He is followed by Dr. Alvy Bimler for CLL diagnosed in 2014.  Last treated in April 2022 with chemotherapy and rituximab.  He has been followed with CT chest, abdomen, most recent 02/05/2021 as below.  It looks like he has a PET scan ordered for 02/25/2021.  Has albuterol, does not use. He does have some exertional SOB. He did use some albuterol also required O2 transiently when he had COVID 09/2018.   CT chest 02/05/2021 reviewed by me, shows some mild interstitial change with an apical to basal gradient and no clear honeycomb change.  He has bilateral pulmonary nodules and large subpleural opacities in the right lower lobe 7.9 x 4.1 cm, lingula 3.2 x 1.6 cm that are enlarging compared with prior.  Also with a newly enlarged left axillary node, multiple subcutaneous soft tissue nodules including overlying the sacrum, left buttock.   Review of Systems As per HPI   Past Medical History:  Diagnosis Date  . Blood dyscrasia    cll remission  . CLL (chronic lymphocytic leukemia) ([place]) 09/10/2013  . Diabetes mellitus, type II ([place])   . Dilated cardiomyopathy ([place]) 09/14/2018   AFlutter >> EF 40-45 pre DCCV and 45-50 post DCCV in 08/2018 // probable tachycardia mediated.  Marland Kitchen GERD (gastroesophageal reflux disease)    occ tums  . H/O cardiac catheterization   . H/O exercise stress test 1999, 2002  . Hypertension   . Hypertriglyceridemia   . Lymphocytosis   . Obesity   . Pulmonary Function Test 09/2018   PFTs 09/2018:  FEV1 88% predicted; FEV1/FVC 78%; DLCO cor 58     Family History  Problem Relation Age of Onset  . Heart attack Mother   . Cancer Father        liver     Social History   Socioeconomic History  . Marital status: Widowed    Spouse  name: Not on file  . Number of children: 3  . Years of education: Not on file  . Highest education level: Not on file  Occupational History    Employer: [employer]    Comment: retied sale; asphalt work   Tobacco Use  . Smoking status: Former Smoker    Packs/day: 3.00    Years: 10.00    Pack years: 30.00    Quit date: 09/19/1988    Years since quitting: 32.4  . Smokeless tobacco: Never Used  Vaping Use  . Vaping Use: Never used  Substance and Sexual Activity  . Alcohol use: Yes    Alcohol/week: 17.0 - 19.0 standard drinks    Types: 14 Shots of liquor, 3 - 5 Standard drinks or equivalent per week  . Drug use: No  . Sexual activity: Not on file  Other Topics Concern  . Not on file  Social History Narrative  . Not on file   Social Determinants of Health   Financial Resource Strain: Not on file  Food Insecurity: Not on file  Transportation Needs: Not on file  Physical Activity: Not on file  Stress: Not on file  Social Connections: Not on file  Intimate Partner Violence: Not on file     Allergies  Allergen Reactions  . Lipitor [Atorvastatin] Rash  . Metformin Diarrhea  Outpatient Medications Prior to Visit  Medication Sig Dispense Refill  . acyclovir (ZOVIRAX) 400 MG tablet Take 1 tablet (400 mg total) by mouth daily. 30 tablet 3  . allopurinol (ZYLOPRIM) 300 MG tablet TAKE 1 TABLET BY MOUTH EVERY DAY 90 tablet 1  . amiodarone (PACERONE) 200 MG tablet Take 1 tablet (200 mg total) by mouth daily. 60 tablet 3  . doxepin (SINEQUAN) 10 MG capsule Take 10 mg by mouth at bedtime.    Marland Kitchen glimepiride (AMARYL) 2 MG tablet Take 2 mg by mouth daily with breakfast.    . glucose blood test strip Dispense glucometer, strips and lancets preferred by patient's insurance (DX: E11.9 / type 2 DM)    . JARDIANCE 10 MG TABS tablet TAKE 1 TABLET BY MOUTH EVERY DAY 90 mg 3  . levothyroxine (SYNTHROID) 25 MCG tablet Take 25 mcg by mouth daily before breakfast.    . Needle, Disp, (HYPODERMIC  NEEDLE 26GX5/8") 26G X 5/8" MISC See admin instructions.    . Omega-3 Fatty Acids (FISH OIL PO) Take 2 capsules by mouth daily.    . rivaroxaban (XARELTO) 20 MG TABS tablet Take 1 tablet (20 mg total) by mouth daily with supper. 30 tablet 6  . rosuvastatin (CRESTOR) 10 MG tablet Take 10 mg by mouth daily.    . tamsulosin (FLOMAX) 0.4 MG CAPS capsule Take 0.4 mg by mouth daily.     No facility-administered medications prior to visit.         Objective:   Physical Exam Vitals:   02/23/21 1350  BP: 128/84  Pulse: (!) 56  Temp: 97.6 F (36.4 C)  TempSrc: Temporal  SpO2: 98%  Weight: (!) 326 lb 9.6 oz (148.1 kg)  Height: 6\' 1"  (1.854 m)   Gen: Pleasant, obese man, in no distress,  normal affect  ENT: No lesions,  mouth clear,  oropharynx clear, no postnasal drip  Neck: No JVD, no stridor  Lungs: No use of accessory muscles, distant, no crackles or wheezing on normal respiration, no wheeze on forced expiration  Cardiovascular: RRR, heart sounds normal, no murmur or gallops, trace peripheral edema  Musculoskeletal: No deformities, no cyanosis or clubbing  Neuro: alert, awake, non focal  Skin: Warm, no lesions or rash      Assessment & Plan:  Abnormal CT of the chest With enlargement of pleural-based right lower lobe, pleural-based lingular nodules.  This despite decrease in size of his cervical lymphadenopathy while receiving chemotherapy and rituximab through April.  Suspect that this is some sort of lymphomatous conversion that failed to respond.  He has subcutaneous nodules as well noted on CT.  I agree that he needs biopsy to determine tissue type.  Discussed possible bronchoscopy but I think transthoracic needle aspiration would probably be higher yield as it will give a core biopsy and give architecture.  I talked to him about the pros and cons of each procedure.  I also reviewed the case with Dr. Vernard Gambles with interventional radiology who agrees that needle biopsy is  feasible.  The patient has a PET scan later this week and we will see if this scan modifies the plan or helps with biopsy targeting.  Get your PET scan as planned. Dr. [name] work on coordinating biopsy of 1 or both of your lung nodules Follow with Dr [name] in 1 month  DOE (dyspnea on exertion) He has dyspnea with exertion, probable mixed disease on prior pulmonary function testing but with a normal FEV1.  Unclear whether there  is a significant component of COPD here but he is willing to do a trial of albuterol to see if he gets benefit.  If so then we could consider long-acting bronchodilator therapy.  He may benefit ultimately from repeat PFT  Baltazar Apo, MD, PhD 02/23/2021, 3:10 PM [facility] [phone] or if no answer before 7:00PM call [phone] For any issues after 7:00PM please call eLink [phone]

## 2021-02-24 ENCOUNTER — Encounter (HOSPITAL_COMMUNITY): Payer: Self-pay | Admitting: Radiology

## 2021-02-24 NOTE — Progress Notes (Signed)
Derek [name]. [facility] Male, 80 y.o., 1941/04/04  MRN:  [id number] Phone:  [phone] Jerilynn Mages)       PCP:  Wenda Low, MD Primary Cvg:  Medicare/Medicare Part A And B  Next Appt With Radiology ([facility]-NM PET) 02/25/2021 at 11:30 AM           RE: CT LUNG MASS BIOPSY Received: Today [place], [place], [place]  [place], [place] D  Ok to hold for procedure. Recommend holding Xarelto 2 days prior to biopsy given reduced renal function.        Previous Messages   ----- Message -----  From: Garth Bigness D  Sent: 02/23/2021  5:19 PM EDT  To: Oliver Barre, PA  Subject: FW: CT LUNG MASS BIOPSY              Dr [name] is requesting that patient has a lung biopsy done. Patient is on Xarelto and will need to hold for 1 day prior to biopsy. Please advise if okay to hold prior to biopsy, thanks.  ----- Message -----  From: Garth Bigness D  Sent: 02/23/2021  5:14 PM EDT  To: Ir Procedure Requests  Subject: CT LUNG MASS BIOPSY                Procedure: CT LUNG MASS BIOPSY   Reason:  Mass of lower lobe of left lung, Right lower lobe lung mass, RLL and lingular pleural based masses   History:  CT in computer, NM PET scheduled for 02/25/21   Provider: Collene Gobble   Provider Contact: [phone]

## 2021-02-25 ENCOUNTER — Other Ambulatory Visit: Payer: Self-pay

## 2021-02-25 ENCOUNTER — Ambulatory Visit (HOSPITAL_COMMUNITY)
Admission: RE | Admit: 2021-02-25 | Discharge: 2021-02-25 | Disposition: A | Discharge status: Home / Self Care | Payer: Medicare Other | Source: Ambulatory Visit | Attending: Hematology and Oncology | Admitting: Hematology and Oncology

## 2021-02-25 DIAGNOSIS — C9110 Chronic lymphocytic leukemia of B-cell type not having achieved remission: Secondary | ICD-10-CM | POA: Diagnosis not present

## 2021-02-25 DIAGNOSIS — C786 Secondary malignant neoplasm of retroperitoneum and peritoneum: Secondary | ICD-10-CM | POA: Insufficient documentation

## 2021-02-25 DIAGNOSIS — R590 Localized enlarged lymph nodes: Secondary | ICD-10-CM | POA: Insufficient documentation

## 2021-02-25 LAB — GLUCOSE, CAPILLARY: Glucose-Capillary: 140 mg/dL — ABNORMAL HIGH (ref 70–99)

## 2021-02-25 MED ORDER — FLUDEOXYGLUCOSE F - 18 (FDG) INJECTION
16.0000 | Freq: Once | INTRAVENOUS | Status: AC
  Administered 2021-02-25: 15.76 via INTRAVENOUS

## 2021-02-26 ENCOUNTER — Encounter (HOSPITAL_COMMUNITY): Payer: Self-pay | Admitting: Radiology

## 2021-02-26 NOTE — Progress Notes (Signed)
Derek [name]. [facility] Male, 80 y.o., 15-Mar-1941 MRN:  [id number] Phone:  [phone] Jerilynn Mages) PCP:  Wenda Low, MD Primary Cvg:  Medicare/Medicare Part A And B Next Appt With Oncology06/14/2022 at  8:00 AM     RE: CT LUNG MASS BIOPSY Received: Today Arne Cleveland, MD  Jillyn Hidden Ok   CT core RLL pleural based mass  R/o LYMPHOMA! So put in saline   DDH         Previous Messages    ----- Message -----  From: Garth Bigness D  Sent: 02/26/2021  12:37 PM EDT  To: Arne Cleveland, MD  Subject: RE: CT LUNG MASS BIOPSY                         NM PET has been completed  ----- Message -----  From: Arne Cleveland, MD  Sent: 02/24/2021   1:10 PM EDT  To: Jillyn Hidden  Subject: RE: CT LUNG MASS BIOPSY                         PET scheduled, please resubmit after finalized  Thx  DDH     ----- Message -----  From: Garth Bigness D  Sent: 02/24/2021  12:48 PM EDT  To: Ir Procedure Requests  Subject: FW: CT LUNG MASS BIOPSY                          ----- Message -----  From: Garth Bigness D  Sent: 02/23/2021   5:14 PM EDT  To: Ir Procedure Requests  Subject: CT LUNG MASS BIOPSY                             Procedure:  CT LUNG MASS BIOPSY   Reason:   Mass of lower lobe of left lung,  Right lower lobe lung mass, RLL and lingular pleural based masses   History:   CT in computer, NM PET scheduled for 02/25/21   Provider:  Collene Gobble   Provider Contact:  [phone]

## 2021-03-01 DIAGNOSIS — D044 Carcinoma in situ of skin of scalp and neck: Secondary | ICD-10-CM | POA: Diagnosis not present

## 2021-03-01 DIAGNOSIS — Z85828 Personal history of other malignant neoplasm of skin: Secondary | ICD-10-CM | POA: Diagnosis not present

## 2021-03-01 DIAGNOSIS — C4442 Squamous cell carcinoma of skin of scalp and neck: Secondary | ICD-10-CM | POA: Diagnosis not present

## 2021-03-01 DIAGNOSIS — N1832 Chronic kidney disease, stage 3b: Secondary | ICD-10-CM | POA: Diagnosis not present

## 2021-03-01 DIAGNOSIS — L570 Actinic keratosis: Secondary | ICD-10-CM | POA: Diagnosis not present

## 2021-03-02 ENCOUNTER — Other Ambulatory Visit: Payer: Self-pay

## 2021-03-02 ENCOUNTER — Encounter: Payer: Self-pay | Admitting: Hematology and Oncology

## 2021-03-02 ENCOUNTER — Inpatient Hospital Stay: Payer: Medicare Other | Attending: Hematology and Oncology | Admitting: Hematology and Oncology

## 2021-03-02 ENCOUNTER — Telehealth: Payer: Self-pay

## 2021-03-02 DIAGNOSIS — I420 Dilated cardiomyopathy: Secondary | ICD-10-CM | POA: Diagnosis not present

## 2021-03-02 DIAGNOSIS — I700 Atherosclerosis of aorta: Secondary | ICD-10-CM | POA: Diagnosis not present

## 2021-03-02 DIAGNOSIS — Z7901 Long term (current) use of anticoagulants: Secondary | ICD-10-CM | POA: Insufficient documentation

## 2021-03-02 DIAGNOSIS — N184 Chronic kidney disease, stage 4 (severe): Secondary | ICD-10-CM | POA: Insufficient documentation

## 2021-03-02 DIAGNOSIS — J8410 Pulmonary fibrosis, unspecified: Secondary | ICD-10-CM | POA: Insufficient documentation

## 2021-03-02 DIAGNOSIS — Z79899 Other long term (current) drug therapy: Secondary | ICD-10-CM | POA: Diagnosis not present

## 2021-03-02 DIAGNOSIS — C9110 Chronic lymphocytic leukemia of B-cell type not having achieved remission: Secondary | ICD-10-CM

## 2021-03-02 DIAGNOSIS — I2510 Atherosclerotic heart disease of native coronary artery without angina pectoris: Secondary | ICD-10-CM | POA: Diagnosis not present

## 2021-03-02 NOTE — Telephone Encounter (Signed)
Called and left him a earlier message to call the office to back if needed. Radiology scheduling is trying to call him to schedule lung biopsy and left radiology scheduling #.  He called back. Given radiology scheduling # to call and schedule biopsy. He will call today.

## 2021-03-02 NOTE — Progress Notes (Signed)
[place] OFFICE PROGRESS NOTE  Patient Care Team: Wenda Low, MD as PCP - General (Internal Medicine) Jerline Pain, MD as PCP - Cardiology (Cardiology) Jerline Pain, MD as Attending Physician (Cardiology)  ASSESSMENT & PLAN:  CLL (chronic lymphocytic leukemia) I have reviewed PET CT scan with the patient The bone lesions are not assessable to biopsy in the hip The skin lesions are also not palpable The patient had recent procedures for skin cancer in the area near his sacrum is riddled with skin changes consistent with infection I do not believe that the subcutaneous nodules are representative of the disease process in his lungs The patient continues to have mild intermittent cough but denies chest pain or shortness of breath I do recommend the patient to proceed with bronchoscopy and biopsy It is also important to rule out opportunistic infection due to his recent treatment and other immunosuppressive state from diabetes I will reach out to his pulmonologist to see if he can schedule the procedure as soon as possible I will see him back within the week to review test results  No orders of the defined types were placed in this encounter.   All questions were answered. The patient knows to call the clinic with any problems, questions or concerns. The total time spent in the appointment was 30 minutes encounter with patients including review of chart and various tests results, discussions about plan of care and coordination of care plan   [name], MD 03/02/2021 11:13 AM  INTERVAL HISTORY: Please see below for problem oriented charting. He returns to review test results of his PET CT scan He continues to have intermittent cough but denies fever or chills  SUMMARY OF ONCOLOGIC HISTORY: Oncology History Overview Note  Normal FISH    CLL (chronic lymphocytic leukemia) ([place])  09/10/2013 Initial Diagnosis   CLL (chronic lymphocytic leukemia)    12/26/2013  Pathology Results   FISH analysis was normal.    06/17/2014 Procedure   The patient has placement of Infuse-a-Port.    06/18/2014 Imaging   CT scan of the chest, abdomen and pelvis showed diffuse lymphadenopathy and splenomegaly.    06/19/2014 Bone Marrow Biopsy   Bone marrow aspirate and biopsy show CLL.    06/24/2014 - 11/18/2014 Chemotherapy   He received 6 cycles of Obinutuzumab and chlorambucil    09/15/2014 Imaging   Repeat CT scan of the chest, abdomen and pelvis show greater than 50% reduction in lymphadenopathy and splenomegaly    12/19/2014 Imaging   Repeat CT scan showed complete resolution of lymphadenopathy and splenomegaly.    03/09/2020 Cancer Staging   Staging form: Chronic Lymphocytic Leukemia / Small Lymphocytic Lymphoma, AJCC 8th Edition - Clinical stage from 03/09/2020: Modified Rai Stage I (Modified Rai risk: Intermediate, Lymphocytosis: Present, Adenopathy: Present, Organomegaly: Absent, Anemia: Absent, Thrombocytopenia: Absent) - Signed by [name], MD on 03/09/2020    04/22/2020 Imaging   CT neck 1. Cervical adenopathy involving the bilateral level 1B, right 2/3 and bilateral level 5 nodal stations. 2. Prominent to mildly enlarged upper mediastinal nodes. 3. Prominent subcentimeter right intraparotid node.     06/03/2020 -  Chemotherapy   The patient had rituximab for chemotherapy treatment.     08/31/2020 Imaging   CT neck 1. Regression of Leukemia. Resolved widespread cervical lymphadenopathy since August. Largest residual left level IIIb node now 8-9 mm short axis (previously 16 mm). 2.  CT Chest, Abdomen, and Pelvis today are reported separately.     08/31/2020  Imaging   Limited evaluation due to lack of intravenous contrast administration.   7.1 cm left perirenal soft tissue mass along the posterior left upper kidney, suspicious for perirenal lymphoma, although poorly evaluated. This is essentially new from May 2021.   Possible periureteral  soft tissue along the right proximal collecting system/ureter, equivocal.   New 12 mm right lower lobe pulmonary nodule, suspicious for metastasis/pulmonary lymphoma. Additional 6 mm (mean diameter) subpleural nodule in the lingula is new from 2016, indeterminate.   No suspicious lymphadenopathy in the chest, abdomen, or pelvis. Spleen is normal in size.     02/05/2021 Imaging   1. Interval enlargement of multiple subcutaneous soft tissue nodules or lymph nodes, in the left axilla and overlying the sacrum and left buttock. 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 2. Significant interval increase in size of right lower lobe and lingular pulmonary nodules, now with large subpleural opacities, consistent with worsened pulmonary lymphomatous involvement. 3. Interval decrease in size of a left perinephric soft tissue mass.  4. Constellation of findings above suggest mixed response to treatment. 5. 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 No clear evidence of honeycombing. 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 [place], Iss 5, [phone], May 20 2017. 6. Coronary artery disease.   02/26/2021 PET scan   1. Evidence of multi organ lymphoma recurrence. 2. Enlarging foci of hypermetabolic peripheral consolidation in both lungs consistent pulmonary lymphoma. 3. Multiple foci of hypermetabolic subcutaneous nodularity consistent with subcutaneous lymphoma. 4. Solitary hypermetabolic nodal metastasis in the central mesentery and peritoneal implant along the RIGHT iliacus muscle. 5. THree foci of metabolic activity in the skeleton  consistent with skeletal lymphoma.       REVIEW OF SYSTEMS:   Constitutional: Denies fevers, chills or abnormal weight loss Eyes: Denies blurriness of vision Ears, nose, mouth, throat, and face: Denies mucositis or sore throat Respiratory: Denies cough, dyspnea or wheezes Cardiovascular: Denies palpitation, chest discomfort or lower extremity swelling Gastrointestinal:  Denies nausea, heartburn or change in bowel habits Skin: Denies abnormal skin rashes Lymphatics: Denies new lymphadenopathy or easy bruising Neurological:Denies numbness, tingling or new weaknesses Behavioral/Psych: Mood is stable, no new changes  All other systems were reviewed with the patient and are negative.  I have reviewed the past medical history, past surgical history, social history and family history with the patient and they are unchanged from previous note.  ALLERGIES:  is allergic to lipitor [atorvastatin] and metformin.  MEDICATIONS:  Current Outpatient Medications  Medication Sig Dispense Refill   acyclovir (ZOVIRAX) 400 MG tablet Take 1 tablet (400 mg total) by mouth daily. 30 tablet 3   allopurinol (ZYLOPRIM) 300 MG tablet TAKE 1 TABLET BY MOUTH EVERY DAY 90 tablet 1   amiodarone (PACERONE) 200 MG tablet Take 1 tablet (200 mg total) by mouth daily. 60 tablet 3   doxepin (SINEQUAN) 10 MG capsule Take 10 mg by mouth at bedtime.     glimepiride (AMARYL) 2 MG tablet Take 2 mg by mouth daily with breakfast.     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     Needle, Disp, (HYPODERMIC NEEDLE 26GX5/8") 26G X 5/8" MISC See admin instructions.     Omega-3 Fatty Acids (FISH OIL PO) Take 2 capsules by mouth daily.     rivaroxaban (XARELTO) 20 MG TABS tablet Take 1 tablet (20  mg total) by mouth daily with supper. 30 tablet 6   rosuvastatin (CRESTOR) 10 MG tablet Take 10 mg by mouth daily.     tamsulosin (FLOMAX) 0.4 MG CAPS capsule Take 0.4 mg by mouth daily.     No current facility-administered medications for this visit.    PHYSICAL EXAMINATION: ECOG PERFORMANCE STATUS: 1 - Symptomatic but completely ambulatory  Vitals:   03/02/21 0814  BP: (!) 155/59  Pulse: 61  Resp: 18  Temp: 98.5 F (36.9 C)  SpO2: 94%   Filed Weights   03/02/21 0814  Weight: (!) 327 lb 9.6 oz (148.6 kg)    GENERAL:alert, no distress and comfortable SKIN: He has skin infection on his sacral area.  No other palpable abnormalities on his chest wall or back EYES: normal, Conjunctiva are pink and non-injected, sclera clear OROPHARYNX:no exudate, no erythema and lips, buccal mucosa, and tongue normal  NECK: supple, thyroid normal size, non-tender, without nodularity LYMPH:  no palpable lymphadenopathy in the cervical, axillary or inguinal LUNGS: clear to auscultation and percussion with normal breathing effort HEART: regular rate & rhythm and no murmurs and no lower extremity edema ABDOMEN:abdomen soft, non-tender and normal bowel sounds Musculoskeletal:no cyanosis of digits and no clubbing  NEURO: alert & oriented x 3 with fluent speech, no focal motor/sensory deficits  LABORATORY DATA:  I have reviewed the data as listed    Component Value Date/Time   NA 137 01/06/2021 0913   NA 138 10/25/2018 1120   NA 140 06/25/2015 0758   K 4.6 01/06/2021 0913   K 3.9 06/25/2015 0758   CL 107 01/06/2021 0913   CL 101 03/11/2013 1031   CO2 21 (L) 01/06/2021 0913   CO2 25 06/25/2015 0758   GLUCOSE 202 (H) 01/06/2021 0913   GLUCOSE 140 06/25/2015 0758   GLUCOSE 173 (H) 03/11/2013 1031   BUN 26 (H) 01/06/2021 0913   BUN 16 10/25/2018 1120   BUN 15.9 06/25/2015 0758   CREATININE 2.20 (H) 01/06/2021 0913   CREATININE 2.20 (H) 06/01/2020 0957   CREATININE 1.0 06/25/2015 0758    CALCIUM 8.8 (L) 01/06/2021 0913   CALCIUM 9.3 06/25/2015 0758   PROT 6.8 01/06/2021 0913   PROT 6.7 06/25/2015 0758   ALBUMIN 3.8 01/06/2021 0913   ALBUMIN 3.8 06/25/2015 0758   AST 14 (L) 01/06/2021 0913   AST 15 06/01/2020 0957   AST 15 06/25/2015 0758   ALT 12 01/06/2021 0913   ALT 11 06/01/2020 0957   ALT 14 06/25/2015 0758   ALKPHOS 75 01/06/2021 0913   ALKPHOS 61 06/25/2015 0758   BILITOT 0.5 01/06/2021 0913   BILITOT 0.8 06/01/2020 0957   BILITOT 0.95 06/25/2015 0758   GFRNONAA 30 (L) 01/06/2021 0913   GFRNONAA 28 (L) 06/01/2020 0957   GFRAA 29 (L) 06/09/2020 0847   GFRAA 32 (L) 06/01/2020 0957    No results found for: SPEP, UPEP  Lab Results  Component Value Date   WBC 7.5 01/06/2021   NEUTROABS 5.7 01/06/2021   HGB 12.7 (L) 01/06/2021   HCT 37.7 (L) 01/06/2021   MCV 90.0  01/06/2021   PLT 146 (L) 01/06/2021      Chemistry      Component Value Date/Time   NA 137 01/06/2021 0913   NA 138 10/25/2018 1120   NA 140 06/25/2015 0758   K 4.6 01/06/2021 0913   K 3.9 06/25/2015 0758   CL 107 01/06/2021 0913   CL 101 03/11/2013 1031   CO2 21 (L) 01/06/2021 0913   CO2 25 06/25/2015 0758   BUN 26 (H) 01/06/2021 0913   BUN 16 10/25/2018 1120   BUN 15.9 06/25/2015 0758   CREATININE 2.20 (H) 01/06/2021 0913   CREATININE 2.20 (H) 06/01/2020 0957   CREATININE 1.0 06/25/2015 0758      Component Value Date/Time   CALCIUM 8.8 (L) 01/06/2021 0913   CALCIUM 9.3 06/25/2015 0758   ALKPHOS 75 01/06/2021 0913   ALKPHOS 61 06/25/2015 0758   AST 14 (L) 01/06/2021 0913   AST 15 06/01/2020 0957   AST 15 06/25/2015 0758   ALT 12 01/06/2021 0913   ALT 11 06/01/2020 0957   ALT 14 06/25/2015 0758   BILITOT 0.5 01/06/2021 0913   BILITOT 0.8 06/01/2020 0957   BILITOT 0.95 06/25/2015 0758       RADIOGRAPHIC STUDIES: I have reviewed multiple imaging studies with the patient and family I have personally reviewed the radiological images as listed and agreed with the findings  in the report. CT Abdomen Pelvis Wo Contrast  Result Date: 02/06/2021 CLINICAL DATA:  CLL, assess treatment response, ongoing chemotherapy and immunotherapy EXAM: CT CHEST, ABDOMEN AND PELVIS WITHOUT CONTRAST TECHNIQUE: Multidetector CT imaging of the chest, abdomen and pelvis was performed following the standard protocol without IV contrast. COMPARISON:  08/31/2020 FINDINGS: CT CHEST FINDINGS Cardiovascular: Aortic atherosclerosis. Mild cardiomegaly. Three-vessel coronary artery calcifications. No pericardial effusion. Mediastinum/Nodes: No enlarged mediastinal or hilar lymph nodes. There is a newly enlarged left axillary lymph node or subcutaneous soft tissue nodule measuring 1.5 x 1.4 cm, previously no greater than 0.6 cm (series 2, image 23). Thyroid gland, trachea, and esophagus demonstrate no significant findings. 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 No clear evidence of honeycombing. Interval increase in size of pulmonary nodules, now with large subpleural opacities in the dependent right lung base measuring 7.9 x 4.1 cm (series 6, image 149) and in the lingula measuring 3.2 x 1.6 cm (series 6, image 125). No pleural effusion or pneumothorax. Musculoskeletal: No chest wall mass or suspicious bone lesions identified. CT ABDOMEN PELVIS FINDINGS Hepatobiliary: No focal liver abnormality is seen. Status post cholecystectomy. No biliary dilatation. Pancreas: Unremarkable. No pancreatic ductal dilatation or surrounding inflammatory changes. Spleen: Spleen is at the upper limit of normal in size, unchanged, measuring 13.1 cm maximum coronal span. Adrenals/Urinary Tract: Adrenal glands are unremarkable. Atrophic kidneys with extensive perinephric fat stranding. A left-sided perinephric soft tissue mass is diminished in size compared to  prior examination, measuring 4.4 x 2.7 cm, previously 7.1 x 4.2 cm (series 2, image 89). Multiple small nonobstructive right renal calculi, including a calculus in the dependent right renal pelvis measuring approximately 0.8 cm (series 2, image 88). No hydronephrosis. Bladder is unremarkable. Stomach/Bowel: Stomach is within normal limits. Appendix appears normal. No evidence of bowel wall thickening, distention, or inflammatory changes. Descending and sigmoid diverticulosis. Vascular/Lymphatic: Aortic atherosclerosis. No enlarged abdominal or pelvic lymph nodes. Reproductive: No mass or other abnormality. Other: No abdominal wall hernia. Interval enlargement  of multiple subcutaneous soft tissue nodules, overlying the right sacrum measuring 1.6 x 1.5 cm, previously 1.1 x 0.8 cm, and overlying the left buttock measuring 2.0 x 1.4 cm, previously no greater than 0.5 cm (series 2, image 105). No abdominopelvic ascites. Musculoskeletal: No acute or significant osseous findings. IMPRESSION: 1. Interval enlargement of multiple subcutaneous soft tissue nodules or lymph nodes, in the left axilla and overlying the sacrum and left buttock. 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 2. Significant interval increase in size of right lower lobe and lingular pulmonary nodules, now with large subpleural opacities, consistent with worsened pulmonary lymphomatous involvement. 3. Interval decrease in size of a left perinephric soft tissue mass. 4. Constellation of findings above suggest mixed response to treatment. 5. 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 No clear evidence of honeycombing. 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 [place], Iss 5, [phone], May 20 2017. 6. Coronary artery disease. Aortic Atherosclerosis (ICD10-I70.0). Electronically Signed   By: Eddie Candle M.D.   On: 02/06/2021 09:07   CT Chest Wo Contrast  Result Date: 02/06/2021 CLINICAL DATA:  CLL, assess treatment response, ongoing chemotherapy and immunotherapy EXAM: CT CHEST, ABDOMEN AND PELVIS WITHOUT CONTRAST TECHNIQUE: Multidetector CT imaging of the chest, abdomen and pelvis was performed following the standard protocol without IV contrast. COMPARISON:  08/31/2020 FINDINGS: CT CHEST FINDINGS Cardiovascular: Aortic atherosclerosis. Mild cardiomegaly. Three-vessel coronary artery calcifications. No pericardial effusion. Mediastinum/Nodes: No enlarged mediastinal or hilar lymph nodes. There is a newly enlarged left axillary lymph node or subcutaneous soft tissue nodule measuring 1.5 x 1.4 cm, previously no greater than 0.6 cm (series 2, image 23). Thyroid gland, trachea, and esophagus demonstrate no significant findings. 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 No clear evidence of honeycombing. Interval increase in size of pulmonary nodules, now with large subpleural opacities in the dependent right lung base measuring 7.9 x 4.1 cm (series 6, image 149) and in the lingula measuring 3.2 x 1.6 cm (series 6, image 125). No pleural effusion or pneumothorax. Musculoskeletal: No chest wall mass or suspicious bone lesions identified. CT ABDOMEN PELVIS FINDINGS Hepatobiliary: No focal liver abnormality is seen. Status post cholecystectomy. No biliary dilatation. Pancreas: Unremarkable. No pancreatic ductal dilatation or surrounding inflammatory changes. Spleen: Spleen is at the upper limit of normal in size, unchanged,  measuring 13.1 cm maximum coronal span. Adrenals/Urinary Tract: Adrenal glands are unremarkable. Atrophic kidneys with extensive perinephric fat stranding. A left-sided perinephric soft tissue mass is diminished in size compared to prior examination, measuring 4.4 x 2.7 cm, previously 7.1 x 4.2 cm (series 2, image 89). Multiple small nonobstructive right renal calculi, including a calculus in the dependent right renal pelvis measuring approximately 0.8 cm (series 2, image 88). No hydronephrosis. Bladder is unremarkable. Stomach/Bowel: Stomach is within normal limits. Appendix appears normal. No evidence of bowel wall thickening, distention, or inflammatory changes. Descending and sigmoid diverticulosis. Vascular/Lymphatic: Aortic atherosclerosis. No enlarged abdominal or pelvic lymph nodes. Reproductive: No mass or other abnormality. Other: No abdominal wall hernia. Interval enlargement of multiple subcutaneous soft tissue nodules, overlying the right sacrum measuring 1.6 x  1.5 cm, previously 1.1 x 0.8 cm, and overlying the left buttock measuring 2.0 x 1.4 cm, previously no greater than 0.5 cm (series 2, image 105). No abdominopelvic ascites. Musculoskeletal: No acute or significant osseous findings. IMPRESSION: 1. Interval enlargement of multiple subcutaneous soft tissue nodules or lymph nodes, in the left axilla and overlying the sacrum and left buttock. 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 2. Significant interval increase in size of right lower lobe and lingular pulmonary nodules, now with large subpleural opacities, consistent with worsened pulmonary lymphomatous involvement. 3. Interval decrease in size of a left perinephric soft tissue mass. 4. Constellation of findings above suggest mixed response to treatment. 5. Mild pulmonary fibrosis in a pattern with apical to basal gradient, featuring irregular peripheral interstitial opacity and septal thickening with some scattered areas of subpleural  bronchiolectasis. No clear evidence of honeycombing. 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 [place], Iss 5, [phone], May 20 2017. 6. Coronary artery disease. Aortic Atherosclerosis (ICD10-I70.0). Electronically Signed   By: Eddie Candle M.D.   On: 02/06/2021 09:07   NM PET Image Initial (PI) Skull Base To Thigh  Result Date: 02/26/2021 CLINICAL DATA:  Subsequent treatment strategy for chronic lymphocytic leukemia. EXAM: NUCLEAR MEDICINE PET SKULL BASE TO THIGH TECHNIQUE: 15.8 mCi F-18 FDG was injected intravenously. Full-ring PET imaging was performed from the skull base to thigh after the radiotracer. CT data was obtained and used for attenuation correction and anatomic localization. Fasting blood glucose: 140 mg/dl COMPARISON:  CT 02/05/2021 FINDINGS: Mediastinal blood pool activity: SUV max 2.8 Liver activity: SUV max NA NECK: No hypermetabolic lymph nodes in the neck. Incidental CT findings: none CHEST: RIGHT lower lobe consolidative mass measures 8.8 x 6.2 cm increased from 6.5 x 4.7 cm on most recent CT scan. Lesion intensely hypermetabolic SUV max equal 98.3. Similar LEFT lower lobe peripheral mass nodule measuring 2.9 by 2.0 cm with SUV max equal 9.3. No hypermetabolic mediastinal lymph nodes. Hypermetabolic LEFT axillary lymph nodes. For example peripheral node measuring 13 mm SUV max equal 5.3 on image 58. There is a subcutaneous hypermetabolic nodule along the RIGHT chest wall on image 90. Similar lesion posterior to the LEFT chest wall just above the diaphragm with SUV max equal 5 on image 90. Incidental CT findings: none ABDOMEN/PELVIS: No hypermetabolic perinephric masses. There is perinephric stranding on LEFT and RIGHT. There is a calculus at the RIGHT ureteropelvic junction  with potential mild partial obstruction. Distal ureters and bladder normal. Within the central mesentery, there is a solitary hypermetabolic node measuring 2 cm with SUV max equal 7.1 on image 143. There is hypermetabolic implant along the RIGHT iliacus muscle within the RIGHT iliac fossa measuring 13 mm with SUV max equal 7.1. There hypermetabolic subcutaneous nodules posterior in the pelvis (for example image 158) Incidental CT findings: none SKELETON: There is a focal lesion within the LEFT acetabulum measuring approximately 1 cm with SUV max equal 7.6 on image 180. No clear CT corresponding lesion. Discrete lesion in the T11 vertebral body SUV max equal 4.5 on image 107. No clear CT lesion. Third skeletal lesion in the head of the RIGHT clavicle is hypermetabolic without CT findings Incidental CT findings: none IMPRESSION: 1. Evidence of multi organ lymphoma recurrence. 2. Enlarging foci of hypermetabolic peripheral consolidation in both lungs consistent pulmonary lymphoma. 3.  Multiple foci of hypermetabolic subcutaneous nodularity consistent with subcutaneous lymphoma. 4. Solitary hypermetabolic nodal metastasis in the central mesentery and peritoneal implant along the RIGHT iliacus muscle. 5. THree foci of metabolic activity in the skeleton consistent with skeletal lymphoma. Electronically Signed   By: Suzy Bouchard M.D.   On: 02/26/2021 11:35

## 2021-03-02 NOTE — Assessment & Plan Note (Signed)
I have reviewed PET CT scan with the patient The bone lesions are not assessable to biopsy in the hip The skin lesions are also not palpable The patient had recent procedures for skin cancer in the area near his sacrum is riddled with skin changes consistent with infection I do not believe that the subcutaneous nodules are representative of the disease process in his lungs The patient continues to have mild intermittent cough but denies chest pain or shortness of breath I do recommend the patient to proceed with bronchoscopy and biopsy It is also important to rule out opportunistic infection due to his recent treatment and other immunosuppressive state from diabetes I will reach out to his pulmonologist to see if he can schedule the procedure as soon as possible I will see him back within the week to review test results

## 2021-03-03 DIAGNOSIS — E782 Mixed hyperlipidemia: Secondary | ICD-10-CM | POA: Diagnosis not present

## 2021-03-03 DIAGNOSIS — N400 Enlarged prostate without lower urinary tract symptoms: Secondary | ICD-10-CM | POA: Diagnosis not present

## 2021-03-03 DIAGNOSIS — E1122 Type 2 diabetes mellitus with diabetic chronic kidney disease: Secondary | ICD-10-CM | POA: Diagnosis not present

## 2021-03-03 DIAGNOSIS — I10 Essential (primary) hypertension: Secondary | ICD-10-CM | POA: Diagnosis not present

## 2021-03-03 DIAGNOSIS — N182 Chronic kidney disease, stage 2 (mild): Secondary | ICD-10-CM | POA: Diagnosis not present

## 2021-03-03 DIAGNOSIS — I2510 Atherosclerotic heart disease of native coronary artery without angina pectoris: Secondary | ICD-10-CM | POA: Diagnosis not present

## 2021-03-03 DIAGNOSIS — E039 Hypothyroidism, unspecified: Secondary | ICD-10-CM | POA: Diagnosis not present

## 2021-03-04 ENCOUNTER — Other Ambulatory Visit: Payer: Self-pay | Admitting: Radiology

## 2021-03-05 ENCOUNTER — Other Ambulatory Visit: Payer: Self-pay | Admitting: Student

## 2021-03-08 ENCOUNTER — Ambulatory Visit (HOSPITAL_COMMUNITY)
Admission: RE | Admit: 2021-03-08 | Discharge: 2021-03-08 | Disposition: A | Discharge status: Home / Self Care | Payer: Medicare Other | Source: Ambulatory Visit | Attending: Interventional Radiology | Admitting: Interventional Radiology

## 2021-03-08 ENCOUNTER — Telehealth: Payer: Self-pay

## 2021-03-08 ENCOUNTER — Other Ambulatory Visit: Payer: Self-pay

## 2021-03-08 ENCOUNTER — Ambulatory Visit (HOSPITAL_COMMUNITY)
Admission: RE | Admit: 2021-03-08 | Discharge: 2021-03-08 | Disposition: A | Discharge status: Home / Self Care | Payer: Medicare Other | Source: Ambulatory Visit | Attending: Emergency Medicine | Admitting: Emergency Medicine

## 2021-03-08 ENCOUNTER — Encounter (HOSPITAL_COMMUNITY): Payer: Self-pay

## 2021-03-08 DIAGNOSIS — R847 Abnormal histological findings in specimens from respiratory organs and thorax: Secondary | ICD-10-CM | POA: Diagnosis not present

## 2021-03-08 DIAGNOSIS — R918 Other nonspecific abnormal finding of lung field: Secondary | ICD-10-CM | POA: Diagnosis not present

## 2021-03-08 DIAGNOSIS — J939 Pneumothorax, unspecified: Secondary | ICD-10-CM | POA: Insufficient documentation

## 2021-03-08 DIAGNOSIS — Z9889 Other specified postprocedural states: Secondary | ICD-10-CM | POA: Insufficient documentation

## 2021-03-08 DIAGNOSIS — C9110 Chronic lymphocytic leukemia of B-cell type not having achieved remission: Secondary | ICD-10-CM | POA: Diagnosis not present

## 2021-03-08 DIAGNOSIS — R911 Solitary pulmonary nodule: Secondary | ICD-10-CM | POA: Diagnosis not present

## 2021-03-08 LAB — PROTIME-INR
INR: 1.1 (ref 0.8–1.2)
Prothrombin Time: 13.8 seconds (ref 11.4–15.2)

## 2021-03-08 LAB — CBC
HCT: 42.5 % (ref 39.0–52.0)
Hemoglobin: 14.2 g/dL (ref 13.0–17.0)
MCH: 30.3 pg (ref 26.0–34.0)
MCHC: 33.4 g/dL (ref 30.0–36.0)
MCV: 90.6 fL (ref 80.0–100.0)
Platelets: 172 10*3/uL (ref 150–400)
RBC: 4.69 MIL/uL (ref 4.22–5.81)
RDW: 13.7 % (ref 11.5–15.5)
WBC: 7.9 10*3/uL (ref 4.0–10.5)
nRBC: 0 % (ref 0.0–0.2)

## 2021-03-08 LAB — GLUCOSE, CAPILLARY: Glucose-Capillary: 171 mg/dL — ABNORMAL HIGH (ref 70–99)

## 2021-03-08 MED ORDER — LIDOCAINE HCL 1 % IJ SOLN
INTRAMUSCULAR | Status: AC | PRN
  Administered 2021-03-08: 5 mL

## 2021-03-08 MED ORDER — MIDAZOLAM HCL 2 MG/2ML IJ SOLN
INTRAMUSCULAR | Status: AC | PRN
  Administered 2021-03-08: 1 mg via INTRAVENOUS

## 2021-03-08 MED ORDER — FENTANYL CITRATE (PF) 100 MCG/2ML IJ SOLN
INTRAMUSCULAR | Status: AC | PRN
  Administered 2021-03-08: 50 ug via INTRAVENOUS

## 2021-03-08 MED ORDER — FENTANYL CITRATE (PF) 100 MCG/2ML IJ SOLN
INTRAMUSCULAR | Status: AC
  Filled 2021-03-08: qty 2

## 2021-03-08 MED ORDER — LIDOCAINE HCL 1 % IJ SOLN
INTRAMUSCULAR | Status: AC
  Filled 2021-03-08: qty 10

## 2021-03-08 MED ORDER — MIDAZOLAM HCL 2 MG/2ML IJ SOLN
INTRAMUSCULAR | Status: AC
  Filled 2021-03-08: qty 2

## 2021-03-08 MED ORDER — SODIUM CHLORIDE 0.9 % IV SOLN: INTRAVENOUS | Status: DC

## 2021-03-08 NOTE — Procedures (Signed)
Interventional Radiology Procedure Note  Procedure: CT guided biopsy of RLL mass. Possible primary vs lymphoma Complications: None Recommendations: - Bedrest until CXR cleared.  Minimize talking, coughing or otherwise straining.  - Follow up 1 hr CXR pending  - NPO until CXR cleared  Signed,  Corrie Mckusick, DO

## 2021-03-08 NOTE — Telephone Encounter (Signed)
-----   Message from [name], MD sent at 03/08/2021  8:23 AM EDT ----- Vonna Kotyk,   Please call his significant other Looks like he is getting biopsy today I can see him Friday at 145 pm to review test results, 30 mins

## 2021-03-08 NOTE — H&P (Signed)
Chief Complaint: Patient was seen in consultation today for right lung mass biopsy at the request of DerekRobert [name]  Referring Physician([name]): Baltazar Apo [name]  Supervising Physician: Derek [name]  Patient Status: [facility] - Out-pt  History of Present Illness: Derek [name] is a 80 y.o. male   Known Chronic lymphocytic leukemia 2014 Follows with Dr Derek [name] Follow up scans reveal enlargement of lymphadenopathy; pulmonary nodules and bony lesions  PET scan 02/26/21:  IMPRESSION: 1. Evidence of multi organ lymphoma recurrence. 2. Enlarging foci of hypermetabolic peripheral consolidation in both lungs consistent pulmonary lymphoma. 3. Multiple foci of hypermetabolic subcutaneous nodularity consistent with subcutaneous lymphoma. 4. Solitary hypermetabolic nodal metastasis in the central mesentery and peritoneal implant along the RIGHT iliacus muscle. 5. THree foci of metabolic activity in the skeleton consistent with skeletal lymphoma.  Dr [name] consult note 02/23/21:   With enlargement of pleural-based right lower lobe, pleural-based lingular nodules.  This despite decrease in size of his cervical lymphadenopathy while receiving chemotherapy and rituximab through April.  Suspect that this is some sort of lymphomatous conversion that failed to respond.  He has subcutaneous nodules as well noted on CT.  I agree that he needs biopsy to determine tissue type.  Discussed possible bronchoscopy but I think transthoracic needle aspiration would probably be higher yield as it will give a core biopsy and give architecture.  I talked to him about the pros and cons of each procedure.  I also reviewed the case with Dr. Vernard [name] with interventional radiology who agrees that needle biopsy is feasible.  The patient has a PET scan later this week and we will see if this scan modifies the plan or helps with biopsy targeting.  Scheduled today for RLL lung mass biopsy     Past Medical History:  Diagnosis Date    Blood dyscrasia    cll remission   CLL (chronic lymphocytic leukemia) ([place]) 09/10/2013   Diabetes mellitus, type II ([place])    Dilated cardiomyopathy ([place]) 09/14/2018   AFlutter >> EF 40-45 pre DCCV and 45-50 post DCCV in 08/2018 // probable tachycardia mediated.   GERD (gastroesophageal reflux disease)    occ tums   H/O cardiac catheterization    H/O exercise stress test 1999, 2002   Hypertension    Hypertriglyceridemia    Lymphocytosis    Obesity    Pulmonary Function Test 09/2018   PFTs 09/2018:  FEV1 88% predicted; FEV1/FVC 78%; DLCO cor 58    Past Surgical History:  Procedure Laterality Date   CARDIOVERSION N/A 09/05/2018   Procedure: CARDIOVERSION;  Surgeon: Jerline Pain, MD;  Location: [place];  Service: Cardiovascular;  Laterality: N/A;   CARDIOVERSION N/A 10/22/2018   Procedure: CARDIOVERSION;  Surgeon: Jerline Pain, MD;  Location: [place];  Service: Cardiovascular;  Laterality: N/A;   CARDIOVERSION N/A 08/28/2020   Procedure: CARDIOVERSION;  Surgeon: Josue [name], MD;  Location: [place];  Service: Cardiovascular;  Laterality: N/A;   CARDIOVERSION N/A 11/30/2020   Procedure: CARDIOVERSION;  Surgeon: Jerline Pain, MD;  Location: [facility] ENDOSCOPY;  Service: Cardiovascular;  Laterality: N/A;   CHOLECYSTECTOMY N/A 07/12/2016   Procedure: LAPAROSCOPIC CHOLECYSTECTOMY;  Surgeon: Donnie Mesa, MD;  Location: [place];  Service: General;  Laterality: N/A;   EXTRACORPOREAL SHOCK WAVE LITHOTRIPSY Right 11/18/2019   Procedure: EXTRACORPOREAL SHOCK WAVE LITHOTRIPSY (ESWL);  Surgeon: Lucas Mallow, MD;  [facility];  Service: Urology;  Laterality: Right;   FOOT SURGERY Right    NASAL SINUS SURGERY  TEE WITHOUT CARDIOVERSION N/A 09/05/2018   Procedure: TRANSESOPHAGEAL ECHOCARDIOGRAM (TEE);  Surgeon: Jerline Pain, MD;  Location: [facility] ENDOSCOPY;  Service: Cardiovascular;  Laterality: N/A;   TEE WITHOUT CARDIOVERSION N/A 08/28/2020    Procedure: TRANSESOPHAGEAL ECHOCARDIOGRAM (TEE);  Surgeon: Josue [name], MD;  Location: [place];  Service: Cardiovascular;  Laterality: N/A;   TONSILLECTOMY     UMBILICAL HERNIA REPAIR N/A 07/12/2016   Procedure: UMBILICAL HERINA REPAIR;  Surgeon: Donnie Mesa, MD;  Location: [place];  Service: General;  Laterality: N/A;   vericose vein stripping      Allergies: Lipitor [atorvastatin] and Metformin  Medications: Prior to Admission medications   Medication Sig Start Date End Date Taking? Authorizing Provider  amiodarone (PACERONE) 200 MG tablet Take 1 tablet (200 mg total) by mouth daily. 01/21/21  Yes Fenton, Clint R, PA  doxepin (SINEQUAN) 10 MG capsule Take 10 mg by mouth at bedtime.   Yes [provider]  glimepiride (AMARYL) 2 MG tablet Take 2 mg by mouth daily with breakfast. 08/24/20  Yes [provider]  JARDIANCE 10 MG TABS tablet TAKE 1 TABLET BY MOUTH EVERY DAY 09/17/19  Yes Jerline Pain, MD  levothyroxine (SYNTHROID) 25 MCG tablet Take 25 mcg by mouth daily before breakfast.   Yes [provider]  Omega-3 Fatty Acids (FISH OIL PO) Take 2 capsules by mouth daily.   Yes [provider]  rosuvastatin (CRESTOR) 10 MG tablet Take 10 mg by mouth daily. 02/27/20  Yes [provider]  tamsulosin (FLOMAX) 0.4 MG CAPS capsule Take 0.4 mg by mouth daily. 03/21/20  Yes [provider]  acyclovir (ZOVIRAX) 400 MG tablet Take 1 tablet (400 mg total) by mouth daily. 12/09/20   [name], MD  allopurinol (ZYLOPRIM) 300 MG tablet TAKE 1 TABLET BY MOUTH EVERY DAY 02/01/21   [name], MD  glucose blood test strip Dispense glucometer, strips and lancets preferred by patient'[name] insurance (DX: E11.9 / type 2 DM) 05/18/17   [provider]  Needle, Disp, (HYPODERMIC NEEDLE 26GX5/8") 26G X 5/8" MISC See admin instructions. 10/23/17   [provider]  rivaroxaban (XARELTO) 20 MG TABS tablet Take 1 tablet (20 mg total) by mouth daily  with supper. 12/08/20   Fenton, Doloris Hall, PA     Family History  Problem Relation Age of Onset   Heart attack Mother    Cancer Father        liver    Social History   Socioeconomic History   Marital status: Widowed    Spouse name: Not on file   Number of children: 3   Years of education: Not on file   Highest education level: Not on file  Occupational History    Employer: [employer]    Comment: retied Audiological scientist; asphalt work   Tobacco Use   Smoking status: Former    Packs/day: 3.00    Years: 10.00    Pack years: 30.00    Types: Cigarettes    Quit date: 09/19/1988    Years since quitting: 32.4   Smokeless tobacco: Never  Vaping Use   Vaping Use: Never used  Substance and Sexual Activity   Alcohol use: Yes    Alcohol/week: 17.0 - 19.0 standard drinks    Types: 14 Shots of liquor, 3 - 5 Standard drinks or equivalent per week   Drug use: No   Sexual activity: Not on file  Other Topics Concern   Not on file  Social History Narrative  Not on file   Social Determinants of Health   Financial Resource Strain: Not on file  Food Insecurity: Not on file  Transportation Needs: Not on file  Physical Activity: Not on file  Stress: Not on file  Social Connections: Not on file     Review of Systems: A 12 point ROS discussed and pertinent positives are indicated in the HPI above.  All other systems are negative.  Review of Systems  Constitutional:  Positive for activity change. Negative for fever.  Respiratory:  Negative for cough and shortness of breath.   Cardiovascular:  Negative for chest pain.  Gastrointestinal:  Negative for abdominal pain.  Psychiatric/Behavioral:  Negative for behavioral problems and confusion.    Vital Signs: BP (!) 155/80   Pulse 64   Temp (!) 97.5 F (36.4 C) (Oral)   Ht 6\' 4"  (1.93 m)   Wt (!) 320 lb (145.2 kg)   SpO2 96%   BMI 38.95 kg/m   Physical Exam Vitals reviewed.  HENT:     Mouth/Throat:     Mouth: Mucous membranes are moist.   Cardiovascular:     Rate and Rhythm: Normal rate and regular rhythm.     Heart sounds: Normal heart sounds.  Pulmonary:     Effort: Pulmonary effort is normal.     Breath sounds: Wheezing present.  Abdominal:     Palpations: Abdomen is soft.  Musculoskeletal:     Right lower leg: No edema.     Left lower leg: No edema.  Skin:    General: Skin is warm.  Neurological:     Mental Status: He is oriented to person, place, and time.  Psychiatric:        Behavior: Behavior normal.    Imaging: NM PET Image Initial (PI) Skull Base To Thigh  Result Date: 02/26/2021 CLINICAL DATA:  Subsequent treatment strategy for chronic lymphocytic leukemia. EXAM: NUCLEAR MEDICINE PET SKULL BASE TO THIGH TECHNIQUE: 15.8 mCi F-18 FDG was injected intravenously. Full-ring PET imaging was performed from the skull base to thigh after the radiotracer. CT data was obtained and used for attenuation correction and anatomic localization. Fasting blood glucose: 140 mg/dl COMPARISON:  CT 02/05/2021 FINDINGS: Mediastinal blood pool activity: SUV max 2.8 Liver activity: SUV max NA NECK: No hypermetabolic lymph nodes in the neck. Incidental CT findings: none CHEST: RIGHT lower lobe consolidative mass measures 8.8 x 6.2 cm increased from 6.5 x 4.7 cm on most recent CT scan. Lesion intensely hypermetabolic SUV max equal 53.2. Similar LEFT lower lobe peripheral mass nodule measuring 2.9 by 2.0 cm with SUV max equal 9.3. No hypermetabolic mediastinal lymph nodes. Hypermetabolic LEFT axillary lymph nodes. For example peripheral node measuring 13 mm SUV max equal 5.3 on image 58. There is a subcutaneous hypermetabolic nodule along the RIGHT chest wall on image 90. Similar lesion posterior to the LEFT chest wall just above the diaphragm with SUV max equal 5 on image 90. Incidental CT findings: none ABDOMEN/PELVIS: No hypermetabolic perinephric masses. There is perinephric stranding on LEFT and RIGHT. There is a calculus at the RIGHT  ureteropelvic junction with potential mild partial obstruction. Distal ureters and bladder normal. Within the central mesentery, there is a solitary hypermetabolic node measuring 2 cm with SUV max equal 7.1 on image 143. There is hypermetabolic implant along the RIGHT iliacus muscle within the RIGHT iliac fossa measuring 13 mm with SUV max equal 7.1. There hypermetabolic subcutaneous nodules posterior in the pelvis (for example image 158) Incidental CT findings:  none SKELETON: There is a focal lesion within the LEFT acetabulum measuring approximately 1 cm with SUV max equal 7.6 on image 180. No clear CT corresponding lesion. Discrete lesion in the T11 vertebral body SUV max equal 4.5 on image 107. No clear CT lesion. Third skeletal lesion in the head of the RIGHT clavicle is hypermetabolic without CT findings Incidental CT findings: none IMPRESSION: 1. Evidence of multi organ lymphoma recurrence. 2. Enlarging foci of hypermetabolic peripheral consolidation in both lungs consistent pulmonary lymphoma. 3. Multiple foci of hypermetabolic subcutaneous nodularity consistent with subcutaneous lymphoma. 4. Solitary hypermetabolic nodal metastasis in the central mesentery and peritoneal implant along the RIGHT iliacus muscle. 5. THree foci of metabolic activity in the skeleton consistent with skeletal lymphoma. Electronically Signed   By: Suzy Bouchard M.D.   On: 02/26/2021 11:35    Labs:  CBC: Recent Labs    09/01/20 1203 11/12/20 1040 12/09/20 1125 01/06/21 0913  WBC 6.4 8.3 5.8 7.5  HGB 12.8* 14.0 12.9* 12.7*  HCT 36.2* 40.8 38.1* 37.7*  PLT 143* 163 140* 146*    COAGS: Recent Labs    05/07/20 0622  INR 1.1    BMP: Recent Labs    06/01/20 0957 06/09/20 0847 07/07/20 0926 09/03/20 0902 11/12/20 1040 12/09/20 1125 01/06/21 0913  NA 138 136   < > 138 137 140 137  K 4.5 5.0   < > 4.5 4.4 4.6 4.6  CL 108 106   < > 108 110 107 107  CO2 23 23   < > 21* 17* 22 21*  GLUCOSE 165* 176*   < >  178* 154* 262* 202*  BUN 26* 33*   < > 19 28* 24* 26*  CALCIUM 9.5 9.3   < > 9.3 8.7* 9.0 8.8*  CREATININE 2.20* 2.37*   < > 2.05* 2.12* 2.33* 2.20*  GFRNONAA 28* 25*   < > 33* 31* 28* 30*  GFRAA 32* 29*  --   --   --   --   --    < > = values in this interval not displayed.    LIVER FUNCTION TESTS: Recent Labs    09/01/20 1203 11/12/20 1040 12/09/20 1125 01/06/21 0913  BILITOT 0.8 0.9 0.6 0.5  AST 15 17 12* 14*  ALT 17 15 13 12   ALKPHOS 78 61 67 75  PROT 6.9 6.8 6.7 6.8  ALBUMIN 3.8 3.8 3.8 3.8    TUMOR MARKERS: No results for input([name]): AFPTM, CEA, CA199, CHROMGRNA in the last 8760 hours.  Assessment and Plan:  Known CLL- follows with Dr Derek [name] Follow up imaging revealing enlarging LAN; pulmonary nodules and bony lesions +PET Scheduled today for RLL mass biopsy Risks and benefits of CT guided lung nodule biopsy was discussed with the patient including, but not limited to bleeding, hemoptysis, respiratory failure requiring intubation, infection, pneumothorax requiring chest tube placement, stroke from air embolism or even death.  All of the patient'[name] questions were answered and the patient is agreeable to proceed.  Consent signed and in chart.   Thank you for this interesting consult.  I greatly enjoyed meeting LUISMARIO COSTON and look forward to participating in their care.  A copy of this report was sent to the requesting provider on this date.  Electronically Signed: Lavonia Drafts, PA-C 03/08/2021, 9:47 AM   I spent a total of  30 Minutes   in face to face in clinical consultation, greater than 50% of which was counseling/coordinating care for RLL mass  biopsy

## 2021-03-08 NOTE — Telephone Encounter (Signed)
Called and left below message. Ask Derek [name] to call the office back. Appt scheduled for 6/24 at 1:45 pm.

## 2021-03-08 NOTE — Progress Notes (Signed)
Per Dr Earleen Newport ok to d/c home

## 2021-03-09 ENCOUNTER — Telehealth: Payer: Self-pay

## 2021-03-09 NOTE — Telephone Encounter (Signed)
Derek [name] called back. Agreeable to appt on 6/24 at 1:45 pm. Appt scheduled.

## 2021-03-11 DIAGNOSIS — E1122 Type 2 diabetes mellitus with diabetic chronic kidney disease: Secondary | ICD-10-CM | POA: Diagnosis not present

## 2021-03-11 DIAGNOSIS — R918 Other nonspecific abnormal finding of lung field: Secondary | ICD-10-CM | POA: Diagnosis not present

## 2021-03-11 DIAGNOSIS — I129 Hypertensive chronic kidney disease with stage 1 through stage 4 chronic kidney disease, or unspecified chronic kidney disease: Secondary | ICD-10-CM | POA: Diagnosis not present

## 2021-03-11 DIAGNOSIS — I4892 Unspecified atrial flutter: Secondary | ICD-10-CM | POA: Diagnosis not present

## 2021-03-11 DIAGNOSIS — C9110 Chronic lymphocytic leukemia of B-cell type not having achieved remission: Secondary | ICD-10-CM | POA: Diagnosis not present

## 2021-03-11 DIAGNOSIS — N1832 Chronic kidney disease, stage 3b: Secondary | ICD-10-CM | POA: Diagnosis not present

## 2021-03-11 LAB — SURGICAL PATHOLOGY

## 2021-03-12 ENCOUNTER — Other Ambulatory Visit: Payer: Self-pay

## 2021-03-12 ENCOUNTER — Telehealth: Payer: Self-pay

## 2021-03-12 ENCOUNTER — Inpatient Hospital Stay (HOSPITAL_BASED_OUTPATIENT_CLINIC_OR_DEPARTMENT_OTHER): Payer: Medicare Other | Admitting: Hematology and Oncology

## 2021-03-12 ENCOUNTER — Encounter: Payer: Self-pay | Admitting: Hematology and Oncology

## 2021-03-12 ENCOUNTER — Telehealth: Payer: Self-pay | Admitting: Hematology and Oncology

## 2021-03-12 VITALS — BP 162/68 | HR 55 | Temp 98.7°F | Resp 18 | Wt 323.0 lb

## 2021-03-12 DIAGNOSIS — I700 Atherosclerosis of aorta: Secondary | ICD-10-CM | POA: Diagnosis not present

## 2021-03-12 DIAGNOSIS — C9110 Chronic lymphocytic leukemia of B-cell type not having achieved remission: Secondary | ICD-10-CM

## 2021-03-12 DIAGNOSIS — N184 Chronic kidney disease, stage 4 (severe): Secondary | ICD-10-CM | POA: Diagnosis not present

## 2021-03-12 DIAGNOSIS — Z7189 Other specified counseling: Secondary | ICD-10-CM | POA: Diagnosis not present

## 2021-03-12 DIAGNOSIS — J8410 Pulmonary fibrosis, unspecified: Secondary | ICD-10-CM | POA: Diagnosis not present

## 2021-03-12 DIAGNOSIS — I420 Dilated cardiomyopathy: Secondary | ICD-10-CM | POA: Diagnosis not present

## 2021-03-12 DIAGNOSIS — I484 Atypical atrial flutter: Secondary | ICD-10-CM | POA: Diagnosis not present

## 2021-03-12 DIAGNOSIS — C8338 Diffuse large B-cell lymphoma, lymph nodes of multiple sites: Secondary | ICD-10-CM | POA: Diagnosis not present

## 2021-03-12 DIAGNOSIS — I2510 Atherosclerotic heart disease of native coronary artery without angina pectoris: Secondary | ICD-10-CM | POA: Diagnosis not present

## 2021-03-12 NOTE — Assessment & Plan Note (Signed)
He has history of cardiomyopathy His recent echocardiogram from April showed good ejection fraction I plan to repeat it before deciding which treatment to offer him We discussed the importance of alcohol cessation as this can contribute to cardiomyopathy

## 2021-03-12 NOTE — Assessment & Plan Note (Signed)
I have a long discussion with the patient and caregiver We discussed the importance of lifestyle changes, aggressive dietary modification, stop drinking alcohol and lose weight I will have to prescribe upfront dose adjustment due to very high dosage of calculated chemo due to very high BSA

## 2021-03-12 NOTE — Progress Notes (Signed)

## 2021-03-12 NOTE — Assessment & Plan Note (Signed)
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

## 2021-03-12 NOTE — Telephone Encounter (Signed)
Scheduled appointment per 06/24 sch msg. Patient is aware.

## 2021-03-12 NOTE — Progress Notes (Signed)
ALERT: A disease instance has been permanently removed from this patient's pathway record and replaced with a new disease instance. Information on the new disease instance will be transmitted in a separate message.  Disease Being Removed: Lymphoma and CLL  Reason for Removal: Previous diagnosis has morphed into a different diagnosis

## 2021-03-12 NOTE — Progress Notes (Signed)
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 Thankfully, the disease burden is not much I am concerned about his history of cardiomyopathy I plan to repeat echocardiogram before deciding whether R-CHOP chemotherapy on mini R-CHOP chemotherapy is appropriate course of treatment I will also order port placement and I plan to see him again first week of July to review test results If his echocardiogram show reduced ejection fraction, I might consider changing his treatment to RCEOP  Dilated cardiomyopathy (Derek [name]) He has history of cardiomyopathy His recent echocardiogram from April showed good ejection fraction I plan to repeat it before deciding which treatment to offer him We discussed the importance of alcohol cessation as this can contribute to cardiomyopathy  Chronic kidney disease (CKD), 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Derek [name]) He has significant fluctuation of kidney function, between stage III to stage IV I would likely need upfront dose adjustment of Cytoxan at 50%  Goals of care, counseling/discussion I have a long discussion with the patient and caregiver We discussed the importance of lifestyle changes, aggressive dietary modification, stop drinking alcohol and lose weight I will have to prescribe upfront dose adjustment due to very high dosage of calculated chemo due to very high BSA  Orders Placed This Encounter  Procedures   IR IMAGING GUIDED PORT INSERTION    Standing Status:   Future    Standing Expiration Date:   03/12/2022    Order Specific Question:   Reason for Exam (SYMPTOM  OR DIAGNOSIS REQUIRED)    Answer:    need port for chemo to start 7/11    Order Specific Question:   Preferred Imaging Location?    Answer:   [facility]   Hepatitis B surface antigen    Standing Status:   Standing    Number of Occurrences:   1    Standing Expiration Date:   03/12/2022   Hepatitis B core antibody, total    Standing Status:   Standing    Number of Occurrences:   1    Standing Expiration Date:   03/12/2022   CBC with Differential (Cancer Center Only)    Standing Status:   Standing    Number of Occurrences:   20    Standing Expiration Date:   03/12/2022   CMP ([place] only)    Standing Status:   Standing    Number of Occurrences:   20    Standing Expiration Date:   03/12/2022   ECHOCARDIOGRAM COMPLETE    Standing Status:   Future    Standing Expiration Date:   03/12/2022    Order Specific Question:   Where should this test be performed    Answer:   [facility]    Order Specific Question:   Perflutren DEFINITY (image enhancing agent) should be administered unless hypersensitivity or allergy exist    Answer:   Administer Perflutren    Order Specific Question:   Is a special reader required? (athlete or structural heart)    Answer:   No    Order Specific Question:   Does this study need to be read by the Structural team/Level 3 readers?    Answer:  No    Order Specific Question:   Reason for exam-Echo    Answer:   Chemo  Z09    All questions were answered. The patient knows to call the clinic with any problems, questions or concerns. The total time spent in the appointment was 40 minutes encounter with patients including review of chart and various tests results, discussions about plan of care and coordination of care plan   Derek Lark, MD 03/12/2021 2:39 PM  INTERVAL HISTORY: Please see below for problem oriented charting. 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    06/17/2014 Procedure   The patient has placement of Infuse-a-Port.    06/18/2014 Imaging   CT scan of the chest, abdomen and pelvis showed diffuse lymphadenopathy and splenomegaly.    06/19/2014 Bone Marrow Biopsy   Bone marrow aspirate and biopsy show CLL.    06/24/2014 - 11/18/2014 Chemotherapy   He received 6 cycles of Obinutuzumab and chlorambucil    09/15/2014 Imaging   Repeat CT scan of the chest, abdomen and pelvis show greater than 50% reduction in lymphadenopathy and splenomegaly    12/19/2014 Imaging   Repeat CT scan showed complete resolution of lymphadenopathy and splenomegaly.    04/22/2020 Imaging   CT neck 1. Cervical adenopathy involving the bilateral level 1B, right 2/3 and bilateral level 5 nodal stations. 2. Prominent to mildly enlarged upper mediastinal nodes. 3. Prominent subcentimeter right intraparotid node.     06/03/2020 -  Chemotherapy   The patient had rituximab for chemotherapy treatment.     08/31/2020 Imaging   CT neck 1. Regression of Leukemia. Resolved widespread cervical lymphadenopathy since August. Largest residual left level IIIb node now 8-9 mm short axis (previously 16 mm). 2.  CT Chest, Abdomen, and Pelvis today are reported separately.     08/31/2020 Imaging   Limited evaluation due to lack of intravenous contrast administration.   7.1 cm left perirenal soft tissue mass along the posterior left upper kidney, suspicious for perirenal lymphoma, although poorly evaluated. This is essentially new from May 2021.   Possible periureteral soft tissue along the right proximal collecting system/ureter, equivocal.   New 12 mm right lower lobe pulmonary nodule, suspicious for metastasis/pulmonary lymphoma. Additional 6 mm (mean diameter)  subpleural nodule in the lingula is new from 2016, indeterminate.   No suspicious lymphadenopathy in the chest, abdomen, or pelvis. Spleen is normal in size.     02/05/2021 Imaging   1. Interval enlargement of multiple subcutaneous soft tissue nodules or lymph nodes, in the left axilla and overlying the sacrum and left buttock. 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 2. Significant interval increase in size of right lower lobe and lingular pulmonary nodules, now with large subpleural opacities, consistent with worsened pulmonary lymphomatous involvement. 3. Interval decrease in size of a left perinephric soft tissue mass.  4. Constellation of findings above suggest mixed response to treatment. 5. 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 No clear evidence of honeycombing. 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 [place], Iss 5, [phone], May 20 2017. 6. Coronary artery disease.   02/05/2021 Pathology Results   A. 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  The appearance is diffuse with lack of atypical follicles.  Flow cytometric analysis was attempted ([id number]) but there were insufficient cells present in the sample for analysis. Hence a battery of immunohistochemical  stains was performed and shows that the atypical lymphoid cells are positive for CD20, CD79a, PAX5, BCL-2, and MUM 1.  Only scattered cells are positive for cytoplasmic kappa and not lambda.  There is patchy weak staining for BCL6.  No significant staining is seen with CD10, CD30, CD34, CD138, cyclin D1, or EBV in situ hybridization.  Ki-67 shows variable increased expression ranging from 30% to over 50% in some areas.  There is an admixed T-cell  population in the background to a lesser extent as seen with CD3 and CD5 and there is no apparent co-expression of CD5 in B-cell areas.  The findings are most consistent with involvement by large B-cell lymphoma,  ABC type.  Given the previous history of chronic lymphocytic leukemia/small lymphocytic lymphoma, this likely represents high-grade transformation.  Clinical correlation is recommended   02/26/2021 PET scan   1. Evidence of multi organ lymphoma recurrence. 2. Enlarging foci of hypermetabolic peripheral consolidation in both lungs consistent pulmonary lymphoma. 3. Multiple foci of hypermetabolic subcutaneous nodularity consistent with subcutaneous lymphoma. 4. Solitary hypermetabolic nodal metastasis in the central mesentery and peritoneal implant along the RIGHT iliacus muscle. 5. THree foci of metabolic activity in the skeleton consistent with skeletal lymphoma.     03/08/2021 Procedure   Status post CT-guided biopsy of right lower lobe lung mass   03/12/2021 Cancer Staging   Staging form: Hodgkin and Non-Hodgkin Lymphoma, AJCC 7th Edition - Clinical stage from 03/12/2021: Stage IV - Signed by Derek Lark, MD on 03/12/2021  Staged by: Managing physician  Stage prefix: Recurrence  Biopsy of metastatic site performed: Yes  Source of metastatic specimen: Lung    03/29/2021 -  Chemotherapy    Patient is on Treatment Plan: NON-HODGKINS LYMPHOMA R-CHOP Q21D         REVIEW OF SYSTEMS:   Constitutional: Denies fevers, chills or abnormal  weight loss Eyes: Denies blurriness of vision Ears, nose, mouth, throat, and face: Denies mucositis or sore throat Respiratory: Denies cough, dyspnea or wheezes Cardiovascular: Denies palpitation, chest discomfort or lower extremity swelling Gastrointestinal:  Denies nausea, heartburn or change in bowel habits Skin: Denies abnormal skin rashes Lymphatics: Denies new lymphadenopathy or easy bruising Neurological:Denies numbness, tingling or new weaknesses Behavioral/Psych: Mood is stable, no new changes  All other systems were reviewed with the patient and are negative.  I have reviewed the past medical history, past surgical history, social history and family history with the patient and they are unchanged from previous note.  ALLERGIES:  is allergic to lipitor [atorvastatin] and metformin.  MEDICATIONS:  Current Outpatient Medications  Medication Sig Dispense Refill   acyclovir (ZOVIRAX) 400 MG tablet Take 1 tablet (400 mg total) by mouth daily. 30 tablet 3   allopurinol (ZYLOPRIM)  300 MG tablet TAKE 1 TABLET BY MOUTH EVERY DAY 90 tablet 1   amiodarone (PACERONE) 200 MG tablet Take 1 tablet (200 mg total) by mouth daily. 60 tablet 3   doxepin (SINEQUAN) 10 MG capsule Take 10 mg by mouth at bedtime.     glimepiride (AMARYL) 2 MG tablet Take 2 mg by mouth daily with breakfast.     glucose blood test strip Dispense glucometer, strips and lancets preferred by patient's insurance (DX: E11.9 / type 2 DM)     levothyroxine (SYNTHROID) 25 MCG tablet Take 25 mcg by mouth daily before breakfast.     Needle, Disp, (HYPODERMIC NEEDLE 26GX5/8") 26G X 5/8" MISC See admin instructions.     Omega-3 Fatty Acids (FISH OIL PO) Take 2 capsules by mouth daily.     rivaroxaban (XARELTO) 20 MG TABS tablet Take 1 tablet (20 mg total) by mouth daily with supper. 30 tablet 6   rosuvastatin (CRESTOR) 10 MG tablet Take 10 mg by mouth daily.     tamsulosin (FLOMAX) 0.4 MG CAPS capsule Take 0.4 mg by mouth daily.      No current facility-administered medications for this visit.    PHYSICAL EXAMINATION: ECOG PERFORMANCE STATUS: 1 - Symptomatic but completely ambulatory  Vitals:   03/12/21 1342  BP: (!) 162/68  Pulse: (!) 55  Resp: 18  Temp: 98.7 F (37.1 C)  SpO2: 97%   Filed Weights   03/12/21 1342  Weight: (!) 323 lb (146.5 kg)    GENERAL:alert, no distress and comfortable NEURO: alert & oriented x 3 with fluent speech, no focal motor/sensory deficits  LABORATORY DATA:  I have reviewed the data as listed    Component Value Date/Time   NA 137 01/06/2021 0913   NA 138 10/25/2018 1120   NA 140 06/25/2015 0758   K 4.6 01/06/2021 0913   K 3.9 06/25/2015 0758   CL 107 01/06/2021 0913   CL 101 03/11/2013 1031   CO2 21 (L) 01/06/2021 0913   CO2 25 06/25/2015 0758   GLUCOSE 202 (H) 01/06/2021 0913   GLUCOSE 140 06/25/2015 0758   GLUCOSE 173 (H) 03/11/2013 1031   BUN 26 (H) 01/06/2021 0913   BUN 16 10/25/2018 1120   BUN 15.9 06/25/2015 0758   CREATININE 2.20 (H) 01/06/2021 0913   CREATININE 2.20 (H) 06/01/2020 0957   CREATININE 1.0 06/25/2015 0758   CALCIUM 8.8 (L) 01/06/2021 0913   CALCIUM 9.3 06/25/2015 0758   PROT 6.8 01/06/2021 0913   PROT 6.7 06/25/2015 0758   ALBUMIN 3.8 01/06/2021 0913   ALBUMIN 3.8 06/25/2015 0758   AST 14 (L) 01/06/2021 0913   AST 15 06/01/2020 0957   AST 15 06/25/2015 0758   ALT 12 01/06/2021 0913   ALT 11 06/01/2020 0957   ALT 14 06/25/2015 0758   ALKPHOS 75 01/06/2021 0913   ALKPHOS 61 06/25/2015 0758   BILITOT 0.5 01/06/2021 0913   BILITOT 0.8 06/01/2020 0957   BILITOT 0.95 06/25/2015 0758   GFRNONAA 30 (L) 01/06/2021 0913   GFRNONAA 28 (L) 06/01/2020 0957   GFRAA 29 (L) 06/09/2020 0847   GFRAA 32 (L) 06/01/2020 0957    No results found for: SPEP, UPEP  Lab Results  Component Value Date   WBC 7.9 03/08/2021   NEUTROABS 5.7 01/06/2021   HGB 14.2 03/08/2021   HCT 42.5 03/08/2021   MCV 90.6 03/08/2021   PLT 172 03/08/2021       Chemistry      Component Value Date/Time  NA 137 01/06/2021 0913   NA 138 10/25/2018 1120   NA 140 06/25/2015 0758   K 4.6 01/06/2021 0913   K 3.9 06/25/2015 0758   CL 107 01/06/2021 0913   CL 101 03/11/2013 1031   CO2 21 (L) 01/06/2021 0913   CO2 25 06/25/2015 0758   BUN 26 (H) 01/06/2021 0913   BUN 16 10/25/2018 1120   BUN 15.9 06/25/2015 0758   CREATININE 2.20 (H) 01/06/2021 0913   CREATININE 2.20 (H) 06/01/2020 0957   CREATININE 1.0 06/25/2015 0758      Component Value Date/Time   CALCIUM 8.8 (L) 01/06/2021 0913   CALCIUM 9.3 06/25/2015 0758   ALKPHOS 75 01/06/2021 0913   ALKPHOS 61 06/25/2015 0758   AST 14 (L) 01/06/2021 0913   AST 15 06/01/2020 0957   AST 15 06/25/2015 0758   ALT 12 01/06/2021 0913   ALT 11 06/01/2020 0957   ALT 14 06/25/2015 0758   BILITOT 0.5 01/06/2021 0913   BILITOT 0.8 06/01/2020 0957   BILITOT 0.95 06/25/2015 0758

## 2021-03-12 NOTE — Assessment & Plan Note (Signed)
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

## 2021-03-12 NOTE — Telephone Encounter (Signed)
Called and left a message for [employer].Echo scheduled at [facility] on 6/28, arrive at 0945 for 10 am appt. Ask for a call back to the office.

## 2021-03-15 ENCOUNTER — Encounter: Payer: Self-pay | Admitting: Hematology and Oncology

## 2021-03-15 ENCOUNTER — Telehealth: Payer: Self-pay

## 2021-03-15 NOTE — Telephone Encounter (Signed)
Called and left a message. Echo scheduled for tomorrow at 10 am, Arrive at 0945 to [facility]. Ask them to call the office back.

## 2021-03-16 ENCOUNTER — Encounter: Payer: Self-pay | Admitting: Hematology and Oncology

## 2021-03-16 ENCOUNTER — Inpatient Hospital Stay (HOSPITAL_COMMUNITY): Admission: RE | Admit: 2021-03-16 | Payer: Medicare Other | Source: Ambulatory Visit

## 2021-03-17 ENCOUNTER — Ambulatory Visit (HOSPITAL_COMMUNITY)
Admission: RE | Admit: 2021-03-17 | Discharge: 2021-03-17 | Disposition: A | Discharge status: Home / Self Care | Payer: Medicare Other | Source: Ambulatory Visit | Attending: Hematology and Oncology | Admitting: Hematology and Oncology

## 2021-03-17 ENCOUNTER — Other Ambulatory Visit: Payer: Self-pay

## 2021-03-17 DIAGNOSIS — I420 Dilated cardiomyopathy: Secondary | ICD-10-CM | POA: Insufficient documentation

## 2021-03-17 DIAGNOSIS — C8338 Diffuse large B-cell lymphoma, lymph nodes of multiple sites: Secondary | ICD-10-CM | POA: Insufficient documentation

## 2021-03-17 DIAGNOSIS — I2510 Atherosclerotic heart disease of native coronary artery without angina pectoris: Secondary | ICD-10-CM | POA: Insufficient documentation

## 2021-03-17 DIAGNOSIS — I4891 Unspecified atrial fibrillation: Secondary | ICD-10-CM | POA: Insufficient documentation

## 2021-03-17 DIAGNOSIS — Z0189 Encounter for other specified special examinations: Secondary | ICD-10-CM

## 2021-03-17 DIAGNOSIS — E785 Hyperlipidemia, unspecified: Secondary | ICD-10-CM | POA: Diagnosis not present

## 2021-03-17 DIAGNOSIS — I10 Essential (primary) hypertension: Secondary | ICD-10-CM | POA: Insufficient documentation

## 2021-03-17 DIAGNOSIS — I080 Rheumatic disorders of both mitral and aortic valves: Secondary | ICD-10-CM | POA: Diagnosis not present

## 2021-03-17 DIAGNOSIS — Z01818 Encounter for other preprocedural examination: Secondary | ICD-10-CM | POA: Insufficient documentation

## 2021-03-17 LAB — ECHOCARDIOGRAM COMPLETE
Area-P 1/2: 3.89 cm2
MV M vel: 5.57 m/s
MV Peak grad: 124.1 mmHg
MV VTI: 1.73 cm2
S' Lateral: 4.4 cm

## 2021-03-17 NOTE — Progress Notes (Signed)
  Echocardiogram 2D Echocardiogram with 3D and strain has been performed.  Derek [name] 03/17/2021, 3:38 PM

## 2021-03-18 ENCOUNTER — Other Ambulatory Visit: Payer: Self-pay | Admitting: Radiology

## 2021-03-18 ENCOUNTER — Telehealth: Payer: Self-pay

## 2021-03-18 NOTE — Telephone Encounter (Signed)
He called to review upcoming appts. Reviewed upcoming appts. He verbalized understanding.

## 2021-03-18 NOTE — Telephone Encounter (Signed)
-----   Message from [name], MD sent at 03/18/2021  9:03 AM EDT ----- Pls call and let him know ECHO is ok Will proceed with chemo as scheduled

## 2021-03-18 NOTE — Telephone Encounter (Signed)
Called and left below message. Ask him to call the office for questions. 

## 2021-03-19 ENCOUNTER — Other Ambulatory Visit: Payer: Self-pay

## 2021-03-19 ENCOUNTER — Ambulatory Visit (HOSPITAL_COMMUNITY)
Admission: RE | Admit: 2021-03-19 | Discharge: 2021-03-19 | Disposition: A | Discharge status: Home / Self Care | Payer: Medicare Other | Source: Ambulatory Visit | Attending: Hematology and Oncology | Admitting: Hematology and Oncology

## 2021-03-19 ENCOUNTER — Other Ambulatory Visit: Payer: Self-pay | Admitting: Hematology and Oncology

## 2021-03-19 ENCOUNTER — Encounter (HOSPITAL_COMMUNITY): Payer: Self-pay | Admitting: Hematology and Oncology

## 2021-03-19 ENCOUNTER — Encounter (HOSPITAL_COMMUNITY): Payer: Self-pay

## 2021-03-19 DIAGNOSIS — C8590 Non-Hodgkin lymphoma, unspecified, unspecified site: Secondary | ICD-10-CM | POA: Diagnosis not present

## 2021-03-19 DIAGNOSIS — Z888 Allergy status to other drugs, medicaments and biological substances status: Secondary | ICD-10-CM | POA: Insufficient documentation

## 2021-03-19 DIAGNOSIS — Z7901 Long term (current) use of anticoagulants: Secondary | ICD-10-CM | POA: Insufficient documentation

## 2021-03-19 DIAGNOSIS — Z452 Encounter for adjustment and management of vascular access device: Secondary | ICD-10-CM | POA: Diagnosis not present

## 2021-03-19 DIAGNOSIS — Z79899 Other long term (current) drug therapy: Secondary | ICD-10-CM | POA: Diagnosis not present

## 2021-03-19 DIAGNOSIS — Z79890 Hormone replacement therapy (postmenopausal): Secondary | ICD-10-CM | POA: Insufficient documentation

## 2021-03-19 DIAGNOSIS — C8338 Diffuse large B-cell lymphoma, lymph nodes of multiple sites: Secondary | ICD-10-CM | POA: Diagnosis not present

## 2021-03-19 DIAGNOSIS — Z87891 Personal history of nicotine dependence: Secondary | ICD-10-CM | POA: Insufficient documentation

## 2021-03-19 DIAGNOSIS — Z7984 Long term (current) use of oral hypoglycemic drugs: Secondary | ICD-10-CM | POA: Diagnosis not present

## 2021-03-19 HISTORY — PX: IR IMAGING GUIDED PORT INSERTION: IMG5740

## 2021-03-19 LAB — CBC WITH DIFFERENTIAL/PLATELET
Abs Immature Granulocytes: 0.03 10*3/uL (ref 0.00–0.07)
Basophils Absolute: 0 10*3/uL (ref 0.0–0.1)
Basophils Relative: 0 %
Eosinophils Absolute: 0.1 10*3/uL (ref 0.0–0.5)
Eosinophils Relative: 1 %
HCT: 43.6 % (ref 39.0–52.0)
Hemoglobin: 14.6 g/dL (ref 13.0–17.0)
Immature Granulocytes: 0 %
Lymphocytes Relative: 13 %
Lymphs Abs: 1.1 10*3/uL (ref 0.7–4.0)
MCH: 29.6 pg (ref 26.0–34.0)
MCHC: 33.5 g/dL (ref 30.0–36.0)
MCV: 88.3 fL (ref 80.0–100.0)
Monocytes Absolute: 0.8 10*3/uL (ref 0.1–1.0)
Monocytes Relative: 10 %
Neutro Abs: 6.3 10*3/uL (ref 1.7–7.7)
Neutrophils Relative %: 76 %
Platelets: 207 10*3/uL (ref 150–400)
RBC: 4.94 MIL/uL (ref 4.22–5.81)
RDW: 13.4 % (ref 11.5–15.5)
WBC: 8.4 10*3/uL (ref 4.0–10.5)
nRBC: 0 % (ref 0.0–0.2)

## 2021-03-19 LAB — BASIC METABOLIC PANEL
Anion gap: 5 (ref 5–15)
BUN: 23 mg/dL (ref 8–23)
CO2: 23 mmol/L (ref 22–32)
Calcium: 9.3 mg/dL (ref 8.9–10.3)
Chloride: 109 mmol/L (ref 98–111)
Creatinine, Ser: 2.13 mg/dL — ABNORMAL HIGH (ref 0.61–1.24)
GFR, Estimated: 31 mL/min — ABNORMAL LOW (ref >60)
Glucose, Bld: 111 mg/dL — ABNORMAL HIGH (ref 70–99)
Potassium: 4.2 mmol/L (ref 3.5–5.1)
Sodium: 137 mmol/L (ref 135–145)

## 2021-03-19 LAB — GLUCOSE, CAPILLARY: Glucose-Capillary: 113 mg/dL — ABNORMAL HIGH (ref 70–99)

## 2021-03-19 MED ORDER — FENTANYL CITRATE (PF) 100 MCG/2ML IJ SOLN
INTRAMUSCULAR | Status: AC | PRN
  Administered 2021-03-19 (×2): 50 ug via INTRAVENOUS

## 2021-03-19 MED ORDER — LIDOCAINE-EPINEPHRINE 1 %-1:100000 IJ SOLN
INTRAMUSCULAR | Status: AC | PRN
  Administered 2021-03-19 (×2): 10 mL via INTRADERMAL

## 2021-03-19 MED ORDER — FENTANYL CITRATE (PF) 100 MCG/2ML IJ SOLN
INTRAMUSCULAR | Status: AC
  Filled 2021-03-19: qty 2

## 2021-03-19 MED ORDER — MIDAZOLAM HCL 2 MG/2ML IJ SOLN
INTRAMUSCULAR | Status: AC | PRN
  Administered 2021-03-19 (×2): 1 mg via INTRAVENOUS

## 2021-03-19 MED ORDER — LIDOCAINE-EPINEPHRINE 1 %-1:100000 IJ SOLN
INTRAMUSCULAR | Status: AC
  Filled 2021-03-19: qty 1

## 2021-03-19 MED ORDER — HEPARIN SOD (PORK) LOCK FLUSH 100 UNIT/ML IV SOLN
INTRAVENOUS | Status: AC
  Filled 2021-03-19: qty 5

## 2021-03-19 MED ORDER — SODIUM CHLORIDE 0.9 % IV SOLN: INTRAVENOUS | Status: DC

## 2021-03-19 MED ORDER — MIDAZOLAM HCL 2 MG/2ML IJ SOLN
INTRAMUSCULAR | Status: AC
  Filled 2021-03-19: qty 2

## 2021-03-19 NOTE — Discharge Instructions (Signed)
Interventional radiology phone numbers °[phone] °After hours [phone] ° ° ° °You have skin glue (dermabond) over your new port. Do not use the lidocaine cream (EMLA cream) over the skin glue until it has healed. The petroleum in the lidocaine cream will dissolve the skin glue resulting in an infection of your new port. Use ice in a zip lock bag for 1-2 minutes over your new port before the cancer center nurses access your port. ° ° °Implanted Port Insertion, Care After °This sheet gives you information about how to care for yourself after your procedure. Your health care provider may also give you more specific instructions. If you have problems or questions, contact your health care provider. °What can I expect after the procedure? °After the procedure, it is common to have: °Discomfort at the port insertion site. °Bruising on the skin over the port. This should improve over 3-4 days. °Follow these instructions at home: °Port care °After your port is placed, you will get a manufacturer's information card. The card has information about your port. Keep this card with you at all times. °Take care of the port as told by your health care provider. Ask your health care provider if you or a family member can get training for taking care of the port at home. A home health care nurse may also take care of the port. °Make sure to remember what type of port you have. °Incision care °Follow instructions from your health care provider about how to take care of your port insertion site. Make sure you: °Wash your hands with soap and water before and after you change your bandage (dressing). If soap and water are not available, use hand sanitizer. °Change your dressing as told by your health care provider. °Leave skin glue in place. These skin closures may need to stay in place for 2 weeks or longer.  °Check your port insertion site every day for signs of infection. Check for: °Redness, swelling, or pain. °Fluid or  blood. °Warmth. °Pus or a bad smell.  °  °  °Activity °Return to your normal activities as told by your health care provider. Ask your health care provider what activities are safe for you. °Do not lift anything that is heavier than 10 lb (4.5 kg), or the limit that you are told, until your health care provider says that it is safe. °General instructions °Take over-the-counter and prescription medicines only as told by your health care provider. °Do not take baths, swim, or use a hot tub until your health care provider approves.You may remove your dressing tomorrow and shower 24 hours after your procedure. °Do not drive for 24 hours if you were given a sedative during your procedure. °Wear a medical alert bracelet in case of an emergency. This will tell any health care providers that you have a port. °Keep all follow-up visits as told by your health care provider. This is important. °Contact a health care provider if: °You cannot flush your port with saline as directed, or you cannot draw blood from the port. °You have a fever or chills. °You have redness, swelling, or pain around your port insertion site. °You have fluid or blood coming from your port insertion site. °Your port insertion site feels warm to the touch. °You have pus or a bad smell coming from the port insertion site. °Get help right away if: °You have chest pain or shortness of breath. °You have bleeding from your port that you cannot control. °Summary °Take care of   the port as told by your health care provider. Keep the manufacturer's information card with you at all times. °Change your dressing as told by your health care provider. °Contact a health care provider if you have a fever or chills or if you have redness, swelling, or pain around your port insertion site. °Keep all follow-up visits as told by your health care provider. °This information is not intended to replace advice given to you by your health care provider. Make sure you discuss any  questions you have with your health care provider. °Document Revised: 04/03/2018 Document Reviewed: 04/03/2018 °Elsevier Patient Education © 2021 [employer] Inc. ° ° ° °Moderate Conscious Sedation, Adult, Care After °This sheet gives you information about how to care for yourself after your procedure. Your health care provider may also give you more specific instructions. If you have problems or questions, contact your health care provider. °What can I expect after the procedure? °After the procedure, it is common to have: °Sleepiness for several hours. °Impaired judgment for several hours. °Difficulty with balance. °Vomiting if you eat too soon. °Follow these instructions at home: °For the time period you were told by your health care provider: °Rest. °Do not participate in activities where you could fall or become injured. °Do not drive or use machinery. °Do not drink alcohol. °Do not take sleeping pills or medicines that cause drowsiness. °Do not make important decisions or sign legal documents. °Do not take care of children on your own.  °  °  °Eating and drinking °Follow the diet recommended by your health care provider. °Drink enough fluid to keep your urine pale yellow. °If you vomit: °Drink water, juice, or soup when you can drink without vomiting. °Make sure you have little or no nausea before eating solid foods.   °General instructions °Take over-the-counter and prescription medicines only as told by your health care provider. °Have a responsible adult stay with you for the time you are told. It is important to have someone help care for you until you are awake and alert. °Do not smoke. °Keep all follow-up visits as told by your health care provider. This is important. °Contact a health care provider if: °You are still sleepy or having trouble with balance after 24 hours. °You feel light-headed. °You keep feeling nauseous or you keep vomiting. °You develop a rash. °You have a fever. °You have redness or  swelling around the IV site. °Get help right away if: °You have trouble breathing. °You have new-onset confusion at home. °Summary °After the procedure, it is common to feel sleepy, have impaired judgment, or feel nauseous if you eat too soon. °Rest after you get home. Know the things you should not do after the procedure. °Follow the diet recommended by your health care provider and drink enough fluid to keep your urine pale yellow. °Get help right away if you have trouble breathing or new-onset confusion at home. °This information is not intended to replace advice given to you by your health care provider. Make sure you discuss any questions you have with your health care provider. °Document Revised: 01/03/2020 Document Reviewed: 08/01/2019 °Elsevier Patient Education © 2021 Elsevier Inc.  °

## 2021-03-19 NOTE — Procedures (Signed)
Pre Procedure Dx: Poor venous access Post Procedural Dx: Same  Successful placement of right IJ approach port-a-cath with tip at the superior caval atrial junction. The catheter is ready for immediate use.  Estimated Blood Loss: Minimal  Complications: None immediate.  Jay Raul Torrance, MD Pager #: [phone]   

## 2021-03-19 NOTE — H&P (Signed)
Chief Complaint: Patient was seen in consultation today for port placement at the request of [place]  Referring Physician(s): [place]  Supervising Physician: Sandi Mariscal  Patient Status: [facility] - Out-pt  History of Present Illness: Derek [name] is a 80 y.o. male with newly diagnosed lymphoma. Prior hx of CLL requiring chemotherapy in 2015. Had right sided port placed then with subsequent removal upon completion of treatment. He is referred for new port placement. PMHx, meds, labs, imaging, allergies reviewed. Feels well, no recent fevers, chills, illness. Has been NPO today as directed.   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   GERD (gastroesophageal reflux disease)    occ tums   H/O cardiac catheterization    H/O exercise stress test 1999, 2002   Hypertension    Hypertriglyceridemia    Lymphocytosis    Obesity    Pulmonary Function Test 09/2018   PFTs 09/2018:  FEV1 88% predicted; FEV1/FVC 78%; DLCO cor 58    Past Surgical History:  Procedure Laterality Date   CARDIOVERSION N/A 09/05/2018   Procedure: CARDIOVERSION;  Surgeon: Jerline Pain, MD;  Location: [place];  Service: Cardiovascular;  Laterality: N/A;   CARDIOVERSION N/A 10/22/2018   Procedure: CARDIOVERSION;  Surgeon: Jerline Pain, MD;  Location: [place];  Service: Cardiovascular;  Laterality: N/A;   CARDIOVERSION N/A 08/28/2020   Procedure: CARDIOVERSION;  Surgeon: Josue [name], MD;  Location: [place];  Service: Cardiovascular;  Laterality: N/A;   CARDIOVERSION N/A 11/30/2020   Procedure: CARDIOVERSION;  Surgeon: Jerline Pain, MD;  Location: [facility] ENDOSCOPY;  Service: Cardiovascular;  Laterality: N/A;   CHOLECYSTECTOMY N/A  07/12/2016   Procedure: LAPAROSCOPIC CHOLECYSTECTOMY;  Surgeon: Donnie Mesa, MD;  Location: [place];  Service: General;  Laterality: N/A;   EXTRACORPOREAL SHOCK WAVE LITHOTRIPSY Right 11/18/2019   Procedure: EXTRACORPOREAL SHOCK WAVE LITHOTRIPSY (ESWL);  Surgeon: Lucas Mallow, MD;  Location: [facility];  Service: Urology;  Laterality: Right;   FOOT SURGERY Right    NASAL SINUS SURGERY     TEE WITHOUT CARDIOVERSION N/A 09/05/2018   Procedure: TRANSESOPHAGEAL ECHOCARDIOGRAM (TEE);  Surgeon: Jerline Pain, MD;  Location: [facility] ENDOSCOPY;  Service: Cardiovascular;  Laterality: N/A;   TEE WITHOUT CARDIOVERSION N/A 08/28/2020   Procedure: TRANSESOPHAGEAL ECHOCARDIOGRAM (TEE);  Surgeon: Josue [name], MD;  Location: [place];  Service: Cardiovascular;  Laterality: N/A;   TONSILLECTOMY     UMBILICAL HERNIA REPAIR N/A 07/12/2016   Procedure: UMBILICAL HERINA REPAIR;  Surgeon: Donnie Mesa, MD;  Location: [place];  Service: General;  Laterality: N/A;   vericose vein stripping      Allergies: Lipitor [atorvastatin] and Metformin  Medications: Prior to Admission medications   Medication Sig Start Date End Date Taking? Authorizing Provider  acyclovir (ZOVIRAX) 400 MG tablet Take 1 tablet (400 mg total) by mouth daily. 12/09/20  Yes Gorsuch, Ernst Spell, MD  amiodarone (PACERONE) 200 MG tablet Take 1 tablet (200 mg total) by mouth daily. 01/21/21  Yes Fenton, Clint R, PA  doxepin (SINEQUAN) 10 MG capsule Take 10 mg by mouth at bedtime.   Yes [provider]  glimepiride (AMARYL) 2 MG tablet Take 2 mg by mouth daily with breakfast. 08/24/20  Yes [provider]  levothyroxine (SYNTHROID) 25 MCG tablet Take 25 mcg by mouth daily before breakfast.   Yes [provider]  Omega-3 Fatty Acids (FISH OIL PO) Take 2 capsules by mouth daily.   Yes [provider]  rosuvastatin (CRESTOR) 10 MG tablet Take 10 mg by mouth daily. 02/27/20  Yes [provider]   tamsulosin (FLOMAX) 0.4 MG CAPS capsule Take 0.4 mg by mouth daily. 03/21/20  Yes [provider]  allopurinol (ZYLOPRIM) 300 MG tablet TAKE 1 TABLET BY MOUTH EVERY DAY 02/01/21   [name], MD  glucose blood test strip Dispense glucometer, strips and lancets preferred by patient's insurance (DX: E11.9 / type 2 DM) 05/18/17   [provider]  Needle, Disp, (HYPODERMIC NEEDLE 26GX5/8") 26G X 5/8" MISC See admin instructions. 10/23/17   [provider]  rivaroxaban (XARELTO) 20 MG TABS tablet Take 1 tablet (20 mg total) by mouth daily with supper. 12/08/20   Fenton, Doloris Hall, PA     Family History  Problem Relation Age of Onset   Heart attack Mother    Cancer Father        liver    Social History   Socioeconomic History   Marital status: Widowed    Spouse name: Not on file   Number of children: 3   Years of education: Not on file   Highest education level: Not on file  Occupational History    Employer: [employer]    Comment: retied Audiological scientist; asphalt work   Tobacco Use   Smoking status: Former    Packs/day: 3.00    Years: 10.00    Pack years: 30.00    Types: Cigarettes    Quit date: 09/19/1988    Years since quitting: 32.5   Smokeless tobacco: Never  Vaping Use   Vaping Use: Never used  Substance and Sexual Activity   Alcohol use: Yes    Alcohol/week: 17.0 - 19.0 standard drinks    Types: 14 Shots of liquor, 3 - 5 Standard drinks or equivalent per week   Drug use: No   Sexual activity: Not on file  Other Topics Concern   Not on file  Social History Narrative   Not on file   Social Determinants of Health   Financial Resource Strain: Not on file  Food Insecurity: Not on file  Transportation Needs: Not on file  Physical Activity: Not on file  Stress: Not on file  Social Connections: Not on file     Review of Systems: A 12 point ROS discussed and pertinent positives are indicated in the HPI above.  All other systems are negative.  Review of  Systems  Vital Signs: BP (!) 163/88   Pulse 62   Temp 98.8 F (37.1 C) (Oral)   Resp 18   SpO2 98%   Physical Exam Constitutional:      Appearance: Normal appearance.  HENT:     Mouth/Throat:     Mouth: Mucous membranes are moist.     Pharynx: Oropharynx is clear.  Cardiovascular:     Rate and Rhythm: Normal rate and regular rhythm.     Heart sounds: Normal heart sounds.  Pulmonary:     Effort: Pulmonary effort is normal. No respiratory distress.     Breath sounds: Normal breath sounds.  Skin:    General: Skin is warm and dry.     Comments: Right upper chest well healed scar from prior port  Neurological:     General: No focal  deficit present.     Mental Status: He is alert and oriented to person, place, and time.  Psychiatric:        Mood and Affect: Mood normal.        Thought Content: Thought content normal.        Judgment: Judgment normal.    Imaging: NM PET Image Initial (PI) Skull Base To Thigh  Result Date: 02/26/2021 CLINICAL DATA:  Subsequent treatment strategy for chronic lymphocytic leukemia. EXAM: NUCLEAR MEDICINE PET SKULL BASE TO THIGH TECHNIQUE: 15.8 mCi F-18 FDG was injected intravenously. Full-ring PET imaging was performed from the skull base to thigh after the radiotracer. CT data was obtained and used for attenuation correction and anatomic localization. Fasting blood glucose: 140 mg/dl COMPARISON:  CT 02/05/2021 FINDINGS: Mediastinal blood pool activity: SUV max 2.8 Liver activity: SUV max NA NECK: No hypermetabolic lymph nodes in the neck. Incidental CT findings: none CHEST: RIGHT lower lobe consolidative mass measures 8.8 x 6.2 cm increased from 6.5 x 4.7 cm on most recent CT scan. Lesion intensely hypermetabolic SUV max equal 82.6. Similar LEFT lower lobe peripheral mass nodule measuring 2.9 by 2.0 cm with SUV max equal 9.3. No hypermetabolic mediastinal lymph nodes. Hypermetabolic LEFT axillary lymph nodes. For example peripheral node measuring 13 mm  SUV max equal 5.3 on image 58. There is a subcutaneous hypermetabolic nodule along the RIGHT chest wall on image 90. Similar lesion posterior to the LEFT chest wall just above the diaphragm with SUV max equal 5 on image 90. Incidental CT findings: none ABDOMEN/PELVIS: No hypermetabolic perinephric masses. There is perinephric stranding on LEFT and RIGHT. There is a calculus at the RIGHT ureteropelvic junction with potential mild partial obstruction. Distal ureters and bladder normal. Within the central mesentery, there is a solitary hypermetabolic node measuring 2 cm with SUV max equal 7.1 on image 143. There is hypermetabolic implant along the RIGHT iliacus muscle within the RIGHT iliac fossa measuring 13 mm with SUV max equal 7.1. There hypermetabolic subcutaneous nodules posterior in the pelvis (for example image 158) Incidental CT findings: none SKELETON: There is a focal lesion within the LEFT acetabulum measuring approximately 1 cm with SUV max equal 7.6 on image 180. No clear CT corresponding lesion. Discrete lesion in the T11 vertebral body SUV max equal 4.5 on image 107. No clear CT lesion. Third skeletal lesion in the head of the RIGHT clavicle is hypermetabolic without CT findings Incidental CT findings: none IMPRESSION: 1. Evidence of multi organ lymphoma recurrence. 2. Enlarging foci of hypermetabolic peripheral consolidation in both lungs consistent pulmonary lymphoma. 3. Multiple foci of hypermetabolic subcutaneous nodularity consistent with subcutaneous lymphoma. 4. Solitary hypermetabolic nodal metastasis in the central mesentery and peritoneal implant along the RIGHT iliacus muscle. 5. THree foci of metabolic activity in the skeleton consistent with skeletal lymphoma. Electronically Signed   By: Suzy Bouchard M.D.   On: 02/26/2021 11:35   DG Chest Port 1 View  Result Date: 03/08/2021 CLINICAL DATA:  80 year old male status post right lung biopsy EXAM: PORTABLE CHEST 1 VIEW COMPARISON:  CT  biopsy 03/08/2021, PET-CT 02/25/2021, chest x-ray 11/09/2018 FINDINGS: Cardiomediastinal silhouette unchanged in size and contour. The right lower lobe mass not well visualized on the current study. The known left-sided lung nodules are not well visualized on the current plain film. No pneumothorax.  No pleural effusion. No new confluent airspace disease. IMPRESSION: No complicating features status post right-sided lung biopsy. Electronically Signed   By: Corrie Mckusick D.O.   On:  03/08/2021 13:23   ECHOCARDIOGRAM COMPLETE  Result Date: 03/17/2021    ECHOCARDIOGRAM REPORT   Patient Name:   JVION TURGEON Date of Exam: 03/17/2021 Medical Rec #:  [id number]   Height:       76.0 in Accession #:    [id number]  Weight:       323.0 lb Date of Birth:  Dec 28, 1940   BSA:          2.716 m Patient Age:    62 years    BP:           162/68 mmHg Patient Gender: M           HR:           55 bpm. Exam Location:  Outpatient Procedure: 2D Echo, 3D Echo, Cardiac Doppler, Color Doppler and Strain Analysis Indications:    Chemo Z09  History:        Patient has no prior history of Echocardiogram examinations,                 most recent 01/08/2021. CAD, Arrythmias:Atrial Fibrillation,                 Signs/Symptoms:Shortness of Breath; Risk Factors:Diabetes,                 Hypertension, Dyslipidemia and morbid obesity.  Sonographer:    Darlina Sicilian RDCS Referring Phys: [id number] NI [place]  1. Left ventricular ejection fraction, by estimation, is 55 to 60%. The left ventricle has normal function. The left ventricle has no regional wall motion abnormalities. There is moderate concentric left ventricular hypertrophy. Left ventricular diastolic parameters are indeterminate. The average left ventricular global longitudinal strain is -20.4 %. The global longitudinal strain is normal.  2. Right ventricular systolic function is normal. The right ventricular size is normal. Tricuspid regurgitation signal is inadequate for assessing PA  pressure.  3. The mitral valve is grossly normal. Mild mitral valve regurgitation. No evidence of mitral stenosis.  4. The aortic valve is tricuspid. There is mild calcification of the aortic valve. Aortic valve regurgitation is not visualized. Mild aortic valve sclerosis is present, with no evidence of aortic valve stenosis.  5. The inferior vena cava is normal in size with greater than 50% respiratory variability, suggesting right atrial pressure of 3 mmHg. FINDINGS  Left Ventricle: Left ventricular ejection fraction, by estimation, is 55 to 60%. The left ventricle has normal function. The left ventricle has no regional wall motion abnormalities. The average left ventricular global longitudinal strain is -20.4 %. The global longitudinal strain is normal. The left ventricular internal cavity size was normal in size. There is moderate concentric left ventricular hypertrophy. Left ventricular diastolic parameters are indeterminate. Right Ventricle: The right ventricular size is normal. No increase in right ventricular wall thickness. Right ventricular systolic function is normal. Tricuspid regurgitation signal is inadequate for assessing PA pressure. Left Atrium: Left atrial size was normal in size. Right Atrium: Right atrial size was normal in size. Pericardium: Trivial pericardial effusion is present. Presence of pericardial fat pad. Mitral Valve: The mitral valve is grossly normal. Mild mitral valve regurgitation. No evidence of mitral valve stenosis. MV peak gradient, 3.7 mmHg. The mean mitral valve gradient is 2.0 mmHg. Tricuspid Valve: The tricuspid valve is grossly normal. Tricuspid valve regurgitation is trivial. No evidence of tricuspid stenosis. Aortic Valve: The aortic valve is tricuspid. There is mild calcification of the aortic valve. Aortic valve regurgitation is not visualized. Mild aortic valve sclerosis  is present, with no evidence of aortic valve stenosis. Pulmonic Valve: The pulmonic valve was  grossly normal. Pulmonic valve regurgitation is not visualized. No evidence of pulmonic stenosis. Aorta: The aortic root and ascending aorta are structurally normal, with no evidence of dilitation. Venous: The inferior vena cava is normal in size with greater than 50% respiratory variability, suggesting right atrial pressure of 3 mmHg. IAS/Shunts: The atrial septum is grossly normal.  LEFT VENTRICLE PLAX 2D LVIDd:         5.40 cm  Diastology LVIDs:         4.40 cm  LV e' medial:    4.69 cm/s LV PW:         1.40 cm  LV E/e' medial:  19.4 LV IVS:        1.50 cm  LV e' lateral:   3.79 cm/s LVOT diam:     2.00 cm  LV E/e' lateral: 24.1 LV SV:         68 LV SV Index:   25       2D Longitudinal Strain LVOT Area:     3.14 cm 2D Strain GLS Avg:     -20.4 %  RIGHT VENTRICLE RV S prime:     12.60 cm/s TAPSE (M-mode): 1.8 cm LEFT ATRIUM             Index       RIGHT ATRIUM           Index LA diam:        5.50 cm 2.03 cm/m  RA Area:     14.20 cm LA Vol (A2C):   32.2 ml 11.86 ml/m RA Volume:   31.50 ml  11.60 ml/m LA Vol (A4C):   36.2 ml 13.33 ml/m LA Biplane Vol: 35.7 ml 13.15 ml/m  AORTIC VALVE LVOT Vmax:   103.67 cm/s LVOT Vmean:  64.900 cm/s LVOT VTI:    0.216 m  AORTA Ao Root diam: 3.80 cm Ao Asc diam:  3.85 cm MITRAL VALVE MV Area (PHT): 3.89 cm    SHUNTS MV Area VTI:   1.73 cm    Systemic VTI:  0.22 m MV Peak grad:  3.7 mmHg    Systemic Diam: 2.00 cm MV Mean grad:  2.0 mmHg MV Vmax:       0.97 m/s MV Vmean:      60.8 cm/s MV Decel Time: 195 msec MR Peak grad: 124.1 mmHg MR Mean grad: 85.0 mmHg MR Vmax:      557.00 cm/s MR Vmean:     441.0 cm/s MV E velocity: 91.28 cm/s MV A velocity: 60.05 cm/s MV E/A ratio:  1.52 Eleonore Chiquito MD Electronically signed by Eleonore Chiquito MD Signature Date/Time: 03/17/2021/5:40:25 PM    Final    CT LUNG MASS BIOPSY  Result Date: 03/08/2021 INDICATION: 80 year old male with a history of multifocal lung masses. He has been referred for biopsy EXAM: CT BIOPSY CORE LUNG/MEDIASTINUM  MEDICATIONS: None. ANESTHESIA/SEDATION: Moderate (conscious) sedation was employed during this procedure. A total of Versed 1.0 mg and Fentanyl 50 mcg was administered intravenously. Moderate Sedation Time: 14 minutes. The patient's level of consciousness and vital signs were monitored continuously by radiology nursing throughout the procedure under my direct supervision. FLUOROSCOPY TIME:  CT COMPLICATIONS: None PROCEDURE: The procedure, risks, benefits, and alternatives were explained to the patient and the patient's family. Specific risks that were addressed included bleeding, infection, pneumothorax, need for further procedure including chest tube placement, chance of delayed pneumothorax or  hemorrhage, hemoptysis, nondiagnostic sample, cardiopulmonary collapse, death. Questions regarding the procedure were encouraged and answered. The patient understands and consents to the procedure. Patient was positioned in the right decubitus position on the CT gantry table and a scout CT of the chest was performed for planning purposes. Once angle of approach was determined, the skin and subcutaneous tissues this scan was prepped and draped in the usual sterile fashion, and a sterile drape was applied covering the operative field. A sterile gown and sterile gloves were used for the procedure. Local anesthesia was provided with 1% Lidocaine. The skin and subcutaneous tissues were infiltrated 1% lidocaine for local anesthesia, and a small stab incision was made with an 11 blade scalpel. Using CT guidance, a 17 gauge trocar needle was advanced into the right lower lobe pleural basedtarget. After confirmation of the tip, separate 18 gauge core biopsies were performed. These were placed into solution for transportation to the lab. Biosentry Device was deployed. A final CT image was performed. Patient tolerated the procedure well and remained hemodynamically stable throughout. No complications were encountered and no significant  blood loss was encounter IMPRESSION: Status post CT-guided biopsy of right lower lobe lung mass. Signed, Dulcy Fanny. Dellia Nims, RPVI [facility] Electronically Signed   By: Corrie Mckusick D.O.   On: 03/08/2021 11:43    Labs:  CBC: Recent Labs    11/12/20 1040 12/09/20 1125 01/06/21 0913 03/08/21 0918  WBC 8.3 5.8 7.5 7.9  HGB 14.0 12.9* 12.7* 14.2  HCT 40.8 38.1* 37.7* 42.5  PLT 163 140* 146* 172    COAGS: Recent Labs    05/07/20 0622 03/08/21 0918  INR 1.1 1.1    BMP: Recent Labs    06/01/20 0957 06/09/20 0847 07/07/20 0926 09/03/20 0902 11/12/20 1040 12/09/20 1125 01/06/21 0913  NA 138 136   < > 138 137 140 137  K 4.5 5.0   < > 4.5 4.4 4.6 4.6  CL 108 106   < > 108 110 107 107  CO2 23 23   < > 21* 17* 22 21*  GLUCOSE 165* 176*   < > 178* 154* 262* 202*  BUN 26* 33*   < > 19 28* 24* 26*  CALCIUM 9.5 9.3   < > 9.3 8.7* 9.0 8.8*  CREATININE 2.20* 2.37*   < > 2.05* 2.12* 2.33* 2.20*  GFRNONAA 28* 25*   < > 33* 31* 28* 30*  GFRAA 32* 29*  --   --   --   --   --    < > = values in this interval not displayed.    LIVER FUNCTION TESTS: Recent Labs    09/01/20 1203 11/12/20 1040 12/09/20 1125 01/06/21 0913  BILITOT 0.8 0.9 0.6 0.5  AST 15 17 12* 14*  ALT 17 15 13 12   ALKPHOS 78 61 67 75  PROT 6.9 6.8 6.7 6.8  ALBUMIN 3.8 3.8 3.8 3.8    TUMOR MARKERS: No results for input(s): AFPTM, CEA, CA199, CHROMGRNA in the last 8760 hours.  Assessment and Plan: Lymphoma Prior hx of CLL Plan for port placement Risks and benefits of image guided port-a-catheter placement was discussed with the patient including, but not limited to bleeding, infection, pneumothorax, or fibrin sheath development and need for additional procedures.  All of the patient's questions were answered, patient is agreeable to proceed. Consent signed and in chart.    Thank you for this interesting consult.  I greatly enjoyed meeting  Derek [name] and look forward to participating in their care.  A copy of this report was sent to the requesting provider on this date.  Electronically Signed: Ascencion Dike, PA-C 03/19/2021, 1:53 PM   I spent a total of 20 minutes in face to face in clinical consultation, greater than 50% of which was counseling/coordinating care for port placement

## 2021-03-23 ENCOUNTER — Inpatient Hospital Stay: Payer: Medicare Other

## 2021-03-23 ENCOUNTER — Encounter: Payer: Self-pay | Admitting: Hematology and Oncology

## 2021-03-23 ENCOUNTER — Other Ambulatory Visit: Payer: Medicare Other

## 2021-03-23 ENCOUNTER — Inpatient Hospital Stay: Payer: Medicare Other | Attending: Hematology and Oncology | Admitting: Hematology and Oncology

## 2021-03-23 ENCOUNTER — Other Ambulatory Visit: Payer: Self-pay

## 2021-03-23 VITALS — BP 145/69 | HR 65 | Temp 98.7°F | Resp 18 | Ht 76.0 in | Wt 319.2 lb

## 2021-03-23 DIAGNOSIS — E119 Type 2 diabetes mellitus without complications: Secondary | ICD-10-CM | POA: Diagnosis not present

## 2021-03-23 DIAGNOSIS — Z5189 Encounter for other specified aftercare: Secondary | ICD-10-CM | POA: Diagnosis not present

## 2021-03-23 DIAGNOSIS — C8330 Diffuse large B-cell lymphoma, unspecified site: Secondary | ICD-10-CM | POA: Diagnosis not present

## 2021-03-23 DIAGNOSIS — C8338 Diffuse large B-cell lymphoma, lymph nodes of multiple sites: Secondary | ICD-10-CM | POA: Diagnosis not present

## 2021-03-23 DIAGNOSIS — Z5112 Encounter for antineoplastic immunotherapy: Secondary | ICD-10-CM | POA: Insufficient documentation

## 2021-03-23 DIAGNOSIS — C9110 Chronic lymphocytic leukemia of B-cell type not having achieved remission: Secondary | ICD-10-CM

## 2021-03-23 DIAGNOSIS — N184 Chronic kidney disease, stage 4 (severe): Secondary | ICD-10-CM | POA: Diagnosis not present

## 2021-03-23 DIAGNOSIS — Z5111 Encounter for antineoplastic chemotherapy: Secondary | ICD-10-CM | POA: Insufficient documentation

## 2021-03-23 DIAGNOSIS — Z7189 Other specified counseling: Secondary | ICD-10-CM

## 2021-03-23 DIAGNOSIS — I420 Dilated cardiomyopathy: Secondary | ICD-10-CM

## 2021-03-23 LAB — CMP (CANCER CENTER ONLY)
ALT: 14 U/L (ref 0–44)
AST: 17 U/L (ref 15–41)
Albumin: 3.5 g/dL (ref 3.5–5.0)
Alkaline Phosphatase: 68 U/L (ref 38–126)
Anion gap: 9 (ref 5–15)
BUN: 22 mg/dL (ref 8–23)
CO2: 22 mmol/L (ref 22–32)
Calcium: 9.3 mg/dL (ref 8.9–10.3)
Chloride: 107 mmol/L (ref 98–111)
Creatinine: 2.4 mg/dL — ABNORMAL HIGH (ref 0.61–1.24)
GFR, Estimated: 27 mL/min — ABNORMAL LOW (ref >60)
Glucose, Bld: 198 mg/dL — ABNORMAL HIGH (ref 70–99)
Potassium: 4.2 mmol/L (ref 3.5–5.1)
Sodium: 138 mmol/L (ref 135–145)
Total Bilirubin: 0.6 mg/dL (ref 0.3–1.2)
Total Protein: 7.1 g/dL (ref 6.5–8.1)

## 2021-03-23 LAB — CBC WITH DIFFERENTIAL (CANCER CENTER ONLY)
Abs Immature Granulocytes: 0.04 10*3/uL (ref 0.00–0.07)
Basophils Absolute: 0 10*3/uL (ref 0.0–0.1)
Basophils Relative: 0 %
Eosinophils Absolute: 0.1 10*3/uL (ref 0.0–0.5)
Eosinophils Relative: 1 %
HCT: 38.2 % — ABNORMAL LOW (ref 39.0–52.0)
Hemoglobin: 13.3 g/dL (ref 13.0–17.0)
Immature Granulocytes: 1 %
Lymphocytes Relative: 11 %
Lymphs Abs: 0.7 10*3/uL (ref 0.7–4.0)
MCH: 29.8 pg (ref 26.0–34.0)
MCHC: 34.8 g/dL (ref 30.0–36.0)
MCV: 85.7 fL (ref 80.0–100.0)
Monocytes Absolute: 0.5 10*3/uL (ref 0.1–1.0)
Monocytes Relative: 8 %
Neutro Abs: 5.3 10*3/uL (ref 1.7–7.7)
Neutrophils Relative %: 79 %
Platelet Count: 175 10*3/uL (ref 150–400)
RBC: 4.46 MIL/uL (ref 4.22–5.81)
RDW: 13.2 % (ref 11.5–15.5)
WBC Count: 6.7 10*3/uL (ref 4.0–10.5)
nRBC: 0 % (ref 0.0–0.2)

## 2021-03-23 LAB — URIC ACID: Uric Acid, Serum: 5.3 mg/dL (ref 3.7–8.6)

## 2021-03-23 LAB — HEPATITIS B SURFACE ANTIGEN: Hepatitis B Surface Ag: NONREACTIVE

## 2021-03-23 LAB — HEPATITIS B CORE ANTIBODY, TOTAL: Hep B Core Total Ab: NONREACTIVE

## 2021-03-23 MED ORDER — LIDOCAINE-PRILOCAINE 2.5-2.5 % EX CREA: TOPICAL_CREAM | CUTANEOUS | 3 refills | Status: AC

## 2021-03-23 MED ORDER — PREDNISONE 20 MG PO TABS: 40.0000 mg | ORAL_TABLET | Freq: Every day | ORAL | 5 refills | Status: DC

## 2021-03-23 MED ORDER — PROCHLORPERAZINE MALEATE 10 MG PO TABS: 10.0000 mg | ORAL_TABLET | Freq: Four times a day (QID) | ORAL | 0 refills | Status: AC | PRN

## 2021-03-23 NOTE — Progress Notes (Signed)
[place] OFFICE PROGRESS NOTE  Patient Care Team: Wenda Low, MD as PCP - General (Internal Medicine) Jerline Pain, MD as PCP - Cardiology (Cardiology) Jerline Pain, MD as Attending Physician (Cardiology)  ASSESSMENT & PLAN:  Diffuse large B cell lymphoma ([place]) I have reviewed test results with the patient He does not have any signs of double hit mutation Given his severe chronic kidney disease, I plan upfront dose adjustment of Cytoxan, vincristine and doxorubicin; we will keep rituximab at full dose Due to his diabetes, I plan to reduce prednisone to 40 mg daily for 4 days after chemo He will return 2 days after treatment for G-CSF support The patient appears interested to participate in clinical trial I will get research team to reach out to him for chair-based exercise for fatigue study I will see him prior to cycle 2 of treatment  Chronic kidney disease (CKD), 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[place]) He has severe chronic kidney disease due to other risk factors As above, I plan upfront dose adjustment We discussed importance of aggressive fluid hydration while on chemotherapy  Dilated cardiomyopathy ([place]) His recent echocardiogram showed preserved ejection fraction I have planned 50% dose adjustment for doxorubicin We will repeat echocardiogram again after 3 cycles of therapy  Diabetes mellitus, type II ([place]) I reminded the patient importance of aggressive lifestyle changes and dietary modification while on treatment He is at risk of severe hypoglycemia due to chronic diabetes  No orders of the defined types were placed in this encounter.   All questions were answered. The patient knows to call the clinic with any problems, questions or concerns. The total time spent in the appointment was 30 minutes encounter with patients including review of chart and various tests results, discussions about plan of care and coordination of care plan   [name], MD 03/23/2021  12:54 PM  INTERVAL HISTORY: Please see below for problem oriented charting. He returns for further follow-up He had occasional cough but no fever chills He had successful port placement without problems He quit drinking altogether His blood sugar in the morning is usually under 120  SUMMARY OF ONCOLOGIC HISTORY: Oncology History Overview Note  Normal FISH for CLL DLBCL transformed from CLL; c-myc by FISH and other rearrangements were not detected   Diffuse large B cell lymphoma ([place])  09/10/2013 Initial Diagnosis   CLL (chronic lymphocytic leukemia)    12/26/2013 Pathology Results   FISH analysis was normal.    06/17/2014 Procedure   The patient has placement of Infuse-a-Port.    06/18/2014 Imaging   CT scan of the chest, abdomen and pelvis showed diffuse lymphadenopathy and splenomegaly.    06/19/2014 Bone Marrow Biopsy   Bone marrow aspirate and biopsy show CLL.    06/24/2014 - 11/18/2014 Chemotherapy   He received 6 cycles of Obinutuzumab and chlorambucil    09/15/2014 Imaging   Repeat CT scan of the chest, abdomen and pelvis show greater than 50% reduction in lymphadenopathy and splenomegaly    12/19/2014 Imaging   Repeat CT scan showed complete resolution of lymphadenopathy and splenomegaly.    04/22/2020 Imaging   CT neck 1. Cervical adenopathy involving the bilateral level 1B, right 2/3 and bilateral level 5 nodal stations. 2. Prominent to mildly enlarged upper mediastinal nodes. 3. Prominent subcentimeter right intraparotid node.     06/03/2020 -  Chemotherapy   The patient had rituximab for chemotherapy treatment.     08/31/2020 Imaging   CT neck 1. Regression of Leukemia.  Resolved widespread cervical lymphadenopathy since August. Largest residual left level IIIb node now 8-9 mm short axis (previously 16 mm). 2.  CT Chest, Abdomen, and Pelvis today are reported separately.     08/31/2020 Imaging   Limited evaluation due to lack of intravenous contrast  administration.   7.1 cm left perirenal soft tissue mass along the posterior left upper kidney, suspicious for perirenal lymphoma, although poorly evaluated. This is essentially new from May 2021.   Possible periureteral soft tissue along the right proximal collecting system/ureter, equivocal.   New 12 mm right lower lobe pulmonary nodule, suspicious for metastasis/pulmonary lymphoma. Additional 6 mm (mean diameter) subpleural nodule in the lingula is new from 2016, indeterminate.   No suspicious lymphadenopathy in the chest, abdomen, or pelvis. Spleen is normal in size.     02/05/2021 Imaging   1. Interval enlargement of multiple subcutaneous soft tissue nodules or lymph nodes, in the left axilla and overlying the sacrum and left buttock. 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 2. Significant interval increase in size of right lower lobe and lingular pulmonary nodules, now with large subpleural opacities, consistent with worsened pulmonary lymphomatous involvement. 3. Interval decrease in size of a left perinephric soft tissue mass.  4. Constellation of findings above suggest mixed response to treatment. 5. 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 No clear evidence of honeycombing. 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 [place], Iss 5, [phone], May 20 2017. 6. Coronary artery disease.   02/05/2021 Pathology Results   A. LUNG, RIGHT LOWER LOBE MASS, NEEDLE CORE BIOPSY:  -Atypical lymphoid infiltrate consistent with non-Hodgkin B-cell lymphoma  -See comment   COMMENT:   The sections show small needle core  biopsy fragments displaying a dense infiltrate of primarily large lymphoid cells characterized by partially clumped to vesicular chromatin and variably prominent nucleoli associated with brisk mitosis.  The appearance is diffuse with lack of atypical follicles.  Flow cytometric analysis was attempted ([id number]) but there were insufficient cells present in the sample for analysis. Hence a battery of immunohistochemical stains was performed and shows that the atypical lymphoid cells are positive for CD20, CD79a, PAX5, BCL-2, and MUM 1.  Only scattered cells are positive for cytoplasmic kappa and not lambda.  There is patchy weak staining for BCL6.  No significant staining is seen with CD10, CD30, CD34, CD138, cyclin D1, or EBV in situ hybridization.  Ki-67 shows variable increased expression ranging from 30% to over 50% in some areas.  There is an admixed T-cell  population in the background to a lesser extent as seen with CD3 and CD5 and there is no apparent co-expression of CD5 in B-cell areas.  The findings are most consistent with involvement by large B-cell lymphoma,  ABC type.  Given the previous history of chronic lymphocytic leukemia/small lymphocytic lymphoma, this likely represents high-grade transformation.  Clinical correlation is recommended   02/26/2021 PET scan   1. Evidence of multi organ lymphoma recurrence. 2. Enlarging foci of hypermetabolic peripheral consolidation in both lungs consistent pulmonary lymphoma. 3. Multiple foci of hypermetabolic subcutaneous nodularity consistent with subcutaneous lymphoma. 4. Solitary hypermetabolic nodal metastasis in the central mesentery and peritoneal implant along the RIGHT iliacus muscle. 5. THree foci  of metabolic activity in the skeleton consistent with skeletal lymphoma.     03/08/2021 Procedure   Status post CT-guided biopsy of right lower lobe lung mass   03/12/2021 Cancer Staging   Staging form: Hodgkin and Non-Hodgkin Lymphoma, AJCC 7th  Edition - Clinical stage from 03/12/2021: Stage IV - Signed by [name], MD on 03/12/2021  Staged by: Managing physician  Stage prefix: Recurrence  Biopsy of metastatic site performed: Yes  Source of metastatic specimen: Lung    03/17/2021 Echocardiogram    1. Left ventricular ejection fraction, by estimation, is 55 to 60%. The left ventricle has normal function. The left ventricle has no regional wall motion abnormalities. There is moderate concentric left ventricular hypertrophy. Left ventricular diastolic parameters are indeterminate. The average left ventricular global longitudinal strain is -20.4 %. The global longitudinal strain is normal.  2. Right ventricular systolic function is normal. The right ventricular size is normal. Tricuspid regurgitation signal is inadequate for assessing PA pressure.  3. The mitral valve is grossly normal. Mild mitral valve regurgitation. No evidence of mitral stenosis.  4. The aortic valve is tricuspid. There is mild calcification of the aortic valve. Aortic valve regurgitation is not visualized. Mild aortic valve sclerosis is present, with no evidence of aortic valve stenosis.  5. The inferior vena cava is normal in size with greater than 50% respiratory variability, suggesting right atrial pressure of 3 mmHg.   03/19/2021 Procedure   Successful placement of a right internal jugular approach power injectable Port-A-Cath. The catheter is ready for immediate use.     03/29/2021 -  Chemotherapy    Patient is on Treatment Plan: NON-HODGKINS LYMPHOMA R-CHOP Q21D         REVIEW OF SYSTEMS:   Constitutional: Denies fevers, chills or abnormal weight loss Eyes: Denies blurriness of vision Ears, nose, mouth, throat, and face: Denies mucositis or sore throat Respiratory: Denies cough, dyspnea or wheezes Cardiovascular: Denies palpitation, chest discomfort or lower extremity swelling Gastrointestinal:  Denies nausea, heartburn or change in bowel  habits Skin: Denies abnormal skin rashes Lymphatics: Denies new lymphadenopathy or easy bruising Neurological:Denies numbness, tingling or new weaknesses Behavioral/Psych: Mood is stable, no new changes  All other systems were reviewed with the patient and are negative.  I have reviewed the past medical history, past surgical history, social history and family history with the patient and they are unchanged from previous note.  ALLERGIES:  is allergic to lipitor [atorvastatin] and metformin.  MEDICATIONS:  Current Outpatient Medications  Medication Sig Dispense Refill   ondansetron (ZOFRAN) 8 MG tablet Take 8 mg by mouth every 8 (eight) hours as needed.     prochlorperazine (COMPAZINE) 10 MG tablet Take 1 tablet (10 mg total) by mouth every 6 (six) hours as needed for nausea or vomiting. 30 tablet 0   acyclovir (ZOVIRAX) 400 MG tablet Take 1 tablet (400 mg total) by mouth daily. 30 tablet 3   allopurinol (ZYLOPRIM) 300 MG tablet TAKE 1 TABLET BY MOUTH EVERY DAY 90 tablet 1   amiodarone (PACERONE) 200 MG tablet Take 1 tablet (200 mg total) by mouth daily. 60 tablet 3   doxepin (SINEQUAN) 10 MG capsule Take 10 mg by mouth at bedtime.     glimepiride (AMARYL) 2 MG tablet Take 2 mg by mouth daily with breakfast.     glucose blood test strip Dispense glucometer, strips and lancets preferred by patient's insurance (DX: E11.9 / type 2 DM)     levothyroxine (SYNTHROID) 25 MCG tablet Take  25 mcg by mouth daily before breakfast.     lidocaine-prilocaine (EMLA) cream Apply to affected area once 30 g 3   Needle, Disp, (HYPODERMIC NEEDLE 26GX5/8") 26G X 5/8" MISC See admin instructions.     Omega-3 Fatty Acids (FISH OIL PO) Take 2 capsules by mouth daily.     predniSONE (DELTASONE) 20 MG tablet Take 2 tablets (40 mg total) by mouth daily. Take with food on for 4 days after chemotherapy every 3 weeks 8 tablet 5   rivaroxaban (XARELTO) 20 MG TABS tablet Take 1 tablet (20 mg total) by mouth daily with  supper. 30 tablet 6   rosuvastatin (CRESTOR) 10 MG tablet Take 10 mg by mouth daily.     tamsulosin (FLOMAX) 0.4 MG CAPS capsule Take 0.4 mg by mouth daily.     No current facility-administered medications for this visit.    PHYSICAL EXAMINATION: ECOG PERFORMANCE STATUS: 1 - Symptomatic but completely ambulatory  Vitals:   03/23/21 1124  BP: (!) 145/69  Pulse: 65  Resp: 18  Temp: 98.7 F (37.1 C)  SpO2: 94%   Filed Weights   03/23/21 1124  Weight: (!) 319 lb 3.2 oz (144.8 kg)    GENERAL:alert, no distress and comfortable NEURO: alert & oriented x 3 with fluent speech, no focal motor/sensory deficits; I removed most of the adhesives on his port site  LABORATORY DATA:  I have reviewed the data as listed    Component Value Date/Time   NA 138 03/23/2021 1107   NA 138 10/25/2018 1120   NA 140 06/25/2015 0758   K 4.2 03/23/2021 1107   K 3.9 06/25/2015 0758   CL 107 03/23/2021 1107   CL 101 03/11/2013 1031   CO2 22 03/23/2021 1107   CO2 25 06/25/2015 0758   GLUCOSE 198 (H) 03/23/2021 1107   GLUCOSE 140 06/25/2015 0758   GLUCOSE 173 (H) 03/11/2013 1031   BUN 22 03/23/2021 1107   BUN 16 10/25/2018 1120   BUN 15.9 06/25/2015 0758   CREATININE 2.40 (H) 03/23/2021 1107   CREATININE 1.0 06/25/2015 0758   CALCIUM 9.3 03/23/2021 1107   CALCIUM 9.3 06/25/2015 0758   PROT 7.1 03/23/2021 1107   PROT 6.7 06/25/2015 0758   ALBUMIN 3.5 03/23/2021 1107   ALBUMIN 3.8 06/25/2015 0758   AST 17 03/23/2021 1107   AST 15 06/25/2015 0758   ALT 14 03/23/2021 1107   ALT 14 06/25/2015 0758   ALKPHOS 68 03/23/2021 1107   ALKPHOS 61 06/25/2015 0758   BILITOT 0.6 03/23/2021 1107   BILITOT 0.95 06/25/2015 0758   GFRNONAA 27 (L) 03/23/2021 1107   GFRAA 29 (L) 06/09/2020 0847   GFRAA 32 (L) 06/01/2020 0957    No results found for: SPEP, UPEP  Lab Results  Component Value Date   WBC 6.7 03/23/2021   NEUTROABS 5.3 03/23/2021   HGB 13.3 03/23/2021   HCT 38.2 (L) 03/23/2021   MCV  85.7 03/23/2021   PLT 175 03/23/2021      Chemistry      Component Value Date/Time   NA 138 03/23/2021 1107   NA 138 10/25/2018 1120   NA 140 06/25/2015 0758   K 4.2 03/23/2021 1107   K 3.9 06/25/2015 0758   CL 107 03/23/2021 1107   CL 101 03/11/2013 1031   CO2 22 03/23/2021 1107   CO2 25 06/25/2015 0758   BUN 22 03/23/2021 1107   BUN 16 10/25/2018 1120   BUN 15.9 06/25/2015 0758   CREATININE 2.40 (  H) 03/23/2021 1107   CREATININE 1.0 06/25/2015 0758      Component Value Date/Time   CALCIUM 9.3 03/23/2021 1107   CALCIUM 9.3 06/25/2015 0758   ALKPHOS 68 03/23/2021 1107   ALKPHOS 61 06/25/2015 0758   AST 17 03/23/2021 1107   AST 15 06/25/2015 0758   ALT 14 03/23/2021 1107   ALT 14 06/25/2015 0758   BILITOT 0.6 03/23/2021 1107   BILITOT 0.95 06/25/2015 0758

## 2021-03-23 NOTE — Assessment & Plan Note (Signed)
I reminded the patient importance of aggressive lifestyle changes and dietary modification while on treatment He is at risk of severe hypoglycemia due to chronic diabetes

## 2021-03-23 NOTE — Assessment & Plan Note (Signed)
His recent echocardiogram showed preserved ejection fraction I have planned 50% dose adjustment for doxorubicin We will repeat echocardiogram again after 3 cycles of therapy

## 2021-03-23 NOTE — Assessment & Plan Note (Signed)
He has severe chronic kidney disease due to other risk factors As above, I plan upfront dose adjustment We discussed importance of aggressive fluid hydration while on chemotherapy

## 2021-03-23 NOTE — Assessment & Plan Note (Signed)
I have reviewed test results with the patient He does not have any signs of double hit mutation Given his severe chronic kidney disease, I plan upfront dose adjustment of Cytoxan, vincristine and doxorubicin; we will keep rituximab at full dose Due to his diabetes, I plan to reduce prednisone to 40 mg daily for 4 days after chemo He will return 2 days after treatment for G-CSF support The patient appears interested to participate in clinical trial I will get research team to reach out to him for chair-based exercise for fatigue study I will see him prior to cycle 2 of treatment

## 2021-03-24 ENCOUNTER — Telehealth: Payer: Self-pay | Admitting: Emergency Medicine

## 2021-03-24 NOTE — Telephone Encounter (Signed)
A Randomized pragmatic Chair-Based Home Exercise Intervention for Mitigating Cancer-Related Fatigue in Older Adults Undergoing Chemotherapy for Advanced Disease  2:03pm: Patient's contact, Mateo Flow, left message with collaborative RN asking for me to call her as patient's phone is not working.  Called and left message requesting return call.

## 2021-03-24 NOTE — Telephone Encounter (Signed)
A Randomized pragmatic Chair-Based Home Exercise Intervention for Mitigating Cancer-Related Fatigue in Older Adults Undergoing Chemotherapy for Advanced Disease  10:53am: Called to introduce this study to the patient.  No answer and unable to leave voicemail.  Clabe Seal Clinical Research Coordinator I  03/24/21  10:57 AM

## 2021-03-25 ENCOUNTER — Encounter: Payer: Self-pay | Admitting: Hematology and Oncology

## 2021-03-29 ENCOUNTER — Inpatient Hospital Stay: Payer: Medicare Other

## 2021-03-29 ENCOUNTER — Other Ambulatory Visit: Payer: Self-pay

## 2021-03-29 VITALS — BP 146/62 | HR 57 | Temp 97.8°F | Resp 20 | Ht 76.0 in | Wt 316.5 lb

## 2021-03-29 DIAGNOSIS — C8338 Diffuse large B-cell lymphoma, lymph nodes of multiple sites: Secondary | ICD-10-CM

## 2021-03-29 DIAGNOSIS — Z5189 Encounter for other specified aftercare: Secondary | ICD-10-CM | POA: Diagnosis not present

## 2021-03-29 DIAGNOSIS — C8330 Diffuse large B-cell lymphoma, unspecified site: Secondary | ICD-10-CM | POA: Diagnosis not present

## 2021-03-29 DIAGNOSIS — Z7189 Other specified counseling: Secondary | ICD-10-CM

## 2021-03-29 DIAGNOSIS — Z5112 Encounter for antineoplastic immunotherapy: Secondary | ICD-10-CM | POA: Diagnosis not present

## 2021-03-29 DIAGNOSIS — Z5111 Encounter for antineoplastic chemotherapy: Secondary | ICD-10-CM | POA: Diagnosis not present

## 2021-03-29 MED ORDER — SODIUM CHLORIDE 0.9 % IV SOLN
10.0000 mg | Freq: Once | INTRAVENOUS | Status: AC
  Administered 2021-03-29: 10 mg via INTRAVENOUS
  Filled 2021-03-29: qty 10

## 2021-03-29 MED ORDER — RITUXIMAB-PVVR CHEMO 500 MG/50ML IV SOLN
375.0000 mg/m2 | Freq: Once | INTRAVENOUS | Status: AC
Start: 2021-03-29 — End: 2021-03-29
  Administered 2021-03-29: 1000 mg via INTRAVENOUS
  Filled 2021-03-29: qty 100

## 2021-03-29 MED ORDER — RITUXIMAB-PVVR CHEMO 500 MG/50ML IV SOLN: 375.0000 mg/m2 | Freq: Once | INTRAVENOUS | Status: DC

## 2021-03-29 MED ORDER — SODIUM CHLORIDE 0.9 % IV SOLN
375.0000 mg/m2 | Freq: Once | INTRAVENOUS | Status: AC
  Administered 2021-03-29: 1040 mg via INTRAVENOUS
  Filled 2021-03-29: qty 52

## 2021-03-29 MED ORDER — ACETAMINOPHEN 325 MG PO TABS
ORAL_TABLET | ORAL | Status: AC
  Filled 2021-03-29: qty 2

## 2021-03-29 MED ORDER — PALONOSETRON HCL INJECTION 0.25 MG/5ML
INTRAVENOUS | Status: AC
  Filled 2021-03-29: qty 5

## 2021-03-29 MED ORDER — PALONOSETRON HCL INJECTION 0.25 MG/5ML
0.2500 mg | Freq: Once | INTRAVENOUS | Status: AC
  Administered 2021-03-29: 0.25 mg via INTRAVENOUS

## 2021-03-29 MED ORDER — SODIUM CHLORIDE 0.9 % IV SOLN
Freq: Once | INTRAVENOUS | Status: AC
  Filled 2021-03-29: qty 250

## 2021-03-29 MED ORDER — SODIUM CHLORIDE 0.9% FLUSH
10.0000 mL | INTRAVENOUS | Status: DC | PRN
  Administered 2021-03-29: 10 mL
  Filled 2021-03-29: qty 10

## 2021-03-29 MED ORDER — ACETAMINOPHEN 325 MG PO TABS
650.0000 mg | ORAL_TABLET | Freq: Once | ORAL | Status: AC
Start: 2021-03-29 — End: 2021-03-29
  Administered 2021-03-29: 650 mg via ORAL

## 2021-03-29 MED ORDER — DIPHENHYDRAMINE HCL 25 MG PO CAPS
ORAL_CAPSULE | ORAL | Status: AC
  Filled 2021-03-29: qty 1

## 2021-03-29 MED ORDER — DOXORUBICIN HCL CHEMO IV INJECTION 2 MG/ML
25.0000 mg/m2 | Freq: Once | INTRAVENOUS | Status: AC
  Administered 2021-03-29: 70 mg via INTRAVENOUS
  Filled 2021-03-29: qty 35

## 2021-03-29 MED ORDER — DIPHENHYDRAMINE HCL 25 MG PO CAPS
25.0000 mg | ORAL_CAPSULE | Freq: Once | ORAL | Status: AC
  Administered 2021-03-29: 25 mg via ORAL

## 2021-03-29 MED ORDER — VINCRISTINE SULFATE CHEMO INJECTION 1 MG/ML
1.0000 mg | Freq: Once | INTRAVENOUS | Status: AC
  Administered 2021-03-29: 1 mg via INTRAVENOUS
  Filled 2021-03-29: qty 1

## 2021-03-29 MED ORDER — SODIUM CHLORIDE 0.9 % IV SOLN
150.0000 mg | Freq: Once | INTRAVENOUS | Status: AC
  Administered 2021-03-29: 150 mg via INTRAVENOUS
  Filled 2021-03-29: qty 150

## 2021-03-29 MED ORDER — HEPARIN SOD (PORK) LOCK FLUSH 100 UNIT/ML IV SOLN
500.0000 [IU] | Freq: Once | INTRAVENOUS | Status: AC | PRN
  Administered 2021-03-29: 500 [IU]
  Filled 2021-03-29: qty 5

## 2021-03-29 NOTE — Patient Instructions (Addendum)
[facility]  Discharge Instructions: Thank you for choosing [facility] to provide your oncology and hematology care.   If you have a lab appointment with the [facility], please go directly to the [facility] and check in at the registration area.   Wear comfortable clothing and clothing appropriate for easy access to any Portacath or PICC line.   We strive to give you quality time with your provider. You may need to reschedule your appointment if you arrive late (15 or more minutes).  Arriving late affects you and other patients whose appointments are after yours.  Also, if you miss three or more appointments without notifying the office, you may be dismissed from the clinic at the provider's discretion.      For prescription refill requests, have your pharmacy contact our office and allow 72 hours for refills to be completed.    Today you received the following chemotherapy and/or immunotherapy agents: Doxorubicin, Vincrsitine, Cytoxan, and Rituximab.     To help prevent nausea and vomiting after your treatment, we encourage you to take your nausea medication as directed.  BELOW ARE SYMPTOMS THAT SHOULD BE REPORTED IMMEDIATELY: *FEVER GREATER THAN 100.4 F (38 C) OR HIGHER *CHILLS OR SWEATING *NAUSEA AND VOMITING THAT IS NOT CONTROLLED WITH YOUR NAUSEA MEDICATION *UNUSUAL SHORTNESS OF BREATH *UNUSUAL BRUISING OR BLEEDING *URINARY PROBLEMS (pain or burning when urinating, or frequent urination) *BOWEL PROBLEMS (unusual diarrhea, constipation, pain near the anus) TENDERNESS IN MOUTH AND THROAT WITH OR WITHOUT PRESENCE OF ULCERS (sore throat, sores in mouth, or a toothache) UNUSUAL RASH, SWELLING OR PAIN  UNUSUAL VAGINAL DISCHARGE OR ITCHING   Items with * indicate a potential emergency and should be followed up as soon as possible or go to the Emergency Department if any problems should occur.  Please show the CHEMOTHERAPY ALERT CARD or  IMMUNOTHERAPY ALERT CARD at check-in to the Emergency Department and triage nurse.  Should you have questions after your visit or need to cancel or reschedule your appointment, please contact [facility]  Dept: [phone]  and follow the prompts.  Office hours are 8:00 a.m. to 4:30 p.m. Monday - Friday. Please note that voicemails left after 4:00 p.m. may not be returned until the following business day.  We are closed weekends and major holidays. You have access to a nurse at all times for urgent questions. Please call the main number to the clinic Dept: [phone] and follow the prompts.   For any non-urgent questions, you may also contact your provider using MyChart. We now offer e-Visits for anyone 6 and older to request care online for non-urgent symptoms. For details visit [email or web address].   Also download the MyChart app! Go to the app store, search "MyChart", open the app, select , and log in with your MyChart username and password.  Due to Covid, a mask is required upon entering the hospital/clinic. If you do not have a mask, one will be given to you upon arrival. For doctor visits, patients may have 1 support person aged 11 or older with them. For treatment visits, patients cannot have anyone with them due to current Covid guidelines and our immunocompromised population.   Doxorubicin injection What is this medication? DOXORUBICIN (dox oh ROO bi sin) is a chemotherapy drug. It is used to treat many kinds of cancer like leukemia, lymphoma, neuroblastoma, sarcoma, and Wilms' tumor. It is also used to treat bladder cancer, breast cancer, lungcancer, ovarian cancer,  stomach cancer, and thyroid cancer. This medicine may be used for other purposes; ask your health care provider orpharmacist if you have questions. COMMON BRAND NAME(S): Adriamycin, Adriamycin PFS, Adriamycin RDF, Rubex What should I tell my care team before I take this  medication? They need to know if you have any of these conditions: heart disease history of low blood counts caused by a medicine liver disease recent or ongoing radiation therapy an unusual or allergic reaction to doxorubicin, other chemotherapy agents, other medicines, foods, dyes, or preservatives pregnant or trying to get pregnant breast-feeding How should I use this medication? This drug is given as an infusion into a vein. It is administered in a hospital or clinic by a specially trained health care professional. If you have pain, swelling, burning or any unusual feeling around the site of your injection,tell your health care professional right away. Talk to your pediatrician regarding the use of this medicine in children.Special care may be needed. Overdosage: If you think you have taken too much of this medicine contact apoison control center or emergency room at once. NOTE: This medicine is only for you. Do not share this medicine with others. What if I miss a dose? It is important not to miss your dose. Call your doctor or health careprofessional if you are unable to keep an appointment. What may interact with this medication? This medicine may interact with the following medications: 6-mercaptopurine paclitaxel phenytoin St. John's Wort trastuzumab verapamil This list may not describe all possible interactions. Give your health care provider a list of all the medicines, herbs, non-prescription drugs, or dietary supplements you use. Also tell them if you smoke, drink alcohol, or use illegaldrugs. Some items may interact with your medicine. What should I watch for while using this medication? This drug may make you feel generally unwell. This is not uncommon, as chemotherapy can affect healthy cells as well as cancer cells. Report any side effects. Continue your course of treatment even though you feel ill unless yourdoctor tells you to stop. There is a maximum amount of this  medicine you should receive throughout your life. The amount depends on the medical condition being treated and your overall health. Your doctor will watch how much of this medicine you receive inyour lifetime. Tell your doctor if you have taken this medicine before. You may need blood work done while you are taking this medicine. Your urine may turn red for a few days after your dose. This is not blood. Ifyour urine is dark or brown, call your doctor. In some cases, you may be given additional medicines to help with side effects.Follow all directions for their use. Call your doctor or health care professional for advice if you get a fever, chills or sore throat, or other symptoms of a cold or flu. Do not treat yourself. This drug decreases your body's ability to fight infections. Try toavoid being around people who are sick. This medicine may increase your risk to bruise or bleed. Call your doctor orhealth care professional if you notice any unusual bleeding. Talk to your doctor about your risk of cancer. You may be more at risk forcertain types of cancers if you take this medicine. Do not become pregnant while taking this medicine or for 6 months after stopping it. Women should inform their doctor if they wish to become pregnant or think they might be pregnant. Men should not father a child while taking this medicine and for 6 months after stopping it. There is a  potential for serious side effects to an unborn child. Talk to your health care professional or pharmacist for more information. Do not breast-feed an infant while takingthis medicine. This medicine has caused ovarian failure in some women and reduced sperm counts in some men This medicine may interfere with the ability to have a child. Talk with your doctor or health care professional if you are concerned about yourfertility. This medicine may cause a decrease in Co-Enzyme Q-10. You should make sure that you get enough Co-Enzyme Q-10 while you  are taking this medicine. Discuss thefoods you eat and the vitamins you take with your health care professional. What side effects may I notice from receiving this medication? Side effects that you should report to your doctor or health care professionalas soon as possible: allergic reactions like skin rash, itching or hives, swelling of the face, lips, or tongue breathing problems chest pain fast or irregular heartbeat low blood counts - this medicine may decrease the number of white blood cells, red blood cells and platelets. You may be at increased risk for infections and bleeding. pain, redness, or irritation at site where injected signs of infection - fever or chills, cough, sore throat, pain or difficulty passing urine signs of decreased platelets or bleeding - bruising, pinpoint red spots on the skin, black, tarry stools, blood in the urine swelling of the ankles, feet, hands tiredness weakness Side effects that usually do not require medical attention (report to yourdoctor or health care professional if they continue or are bothersome): diarrhea hair loss mouth sores nail discoloration or damage nausea red colored urine vomiting This list may not describe all possible side effects. Call your doctor for medical advice about side effects. You may report side effects to FDA at1-800-FDA-1088. Where should I keep my medication? This drug is given in a hospital or clinic and will not be stored at home. NOTE: This sheet is a summary. It may not cover all possible information. If you have questions about this medicine, talk to your doctor, pharmacist, orhealth care provider.  2022 Elsevier/Gold Standard (2017-04-19 11:01:26)  Cyclophosphamide Injection What is this medication? CYCLOPHOSPHAMIDE (sye kloe FOSS fa mide) is a chemotherapy drug. It slows the growth of cancer cells. This medicine is used to treat many types of cancer like lymphoma, myeloma, leukemia, breast cancer, and  ovarian cancer, to name afew. This medicine may be used for other purposes; ask your health care provider orpharmacist if you have questions. COMMON BRAND NAME(S): Cytoxan, Neosar What should I tell my care team before I take this medication? They need to know if you have any of these conditions: heart disease history of irregular heartbeat infection kidney disease liver disease low blood counts, like white cells, platelets, or red blood cells on hemodialysis recent or ongoing radiation therapy scarring or thickening of the lungs trouble passing urine an unusual or allergic reaction to cyclophosphamide, other medicines, foods, dyes, or preservatives pregnant or trying to get pregnant breast-feeding How should I use this medication? This drug is usually given as an injection into a vein or muscle or by infusion into a vein. It is administered in a hospital or clinic by a specially trainedhealth care professional. Talk to your pediatrician regarding the use of this medicine in children.Special care may be needed. Overdosage: If you think you have taken too much of this medicine contact apoison control center or emergency room at once. NOTE: This medicine is only for you. Do not share this medicine with others. What if  I miss a dose? It is important not to miss your dose. Call your doctor or health careprofessional if you are unable to keep an appointment. What may interact with this medication? amphotericin B azathioprine certain antivirals for HIV or hepatitis certain medicines for blood pressure, heart disease, irregular heart beat certain medicines that treat or prevent blood clots like warfarin certain other medicines for cancer cyclosporine etanercept indomethacin medicines that relax muscles for surgery medicines to increase blood counts metronidazole This list may not describe all possible interactions. Give your health care provider a list of all the medicines, herbs,  non-prescription drugs, or dietary supplements you use. Also tell them if you smoke, drink alcohol, or use illegaldrugs. Some items may interact with your medicine. What should I watch for while using this medication? Your condition will be monitored carefully while you are receiving thismedicine. You may need blood work done while you are taking this medicine. Drink water or other fluids as directed. Urinate often, even at night. Some products may contain alcohol. Ask your health care professional if this medicine contains alcohol. Be sure to tell all health care professionals you are taking this medicine. Certain medicines, like metronidazole and disulfiram, can cause an unpleasant reaction when taken with alcohol. The reaction includes flushing, headache, nausea, vomiting, sweating, and increased thirst. Thereaction can last from 30 minutes to several hours. Do not become pregnant while taking this medicine or for 1 year after stopping it. Women should inform their health care professional if they wish to become pregnant or think they might be pregnant. Men should not father a child while taking this medicine and for 4 months after stopping it. There is potential for serious side effects to an unborn child. Talk to your health care professionalfor more information. Do not breast-feed an infant while taking this medicine or for 1 week afterstopping it. This medicine has caused ovarian failure in some women. This medicine may make it more difficult to get pregnant. Talk to your health care professional if [place] concerned about your fertility. This medicine has caused decreased sperm counts in some men. This may make it more difficult to father a child. Talk to your health care professional if [place] concerned about your fertility. Call your health care professional for advice if you get a fever, chills, or sore throat, or other symptoms of a cold or flu. Do not treat yourself. This medicine decreases  your body's ability to fight infections. Try to avoid beingaround people who are sick. Avoid taking medicines that contain aspirin, acetaminophen, ibuprofen, naproxen, or ketoprofen unless instructed by your health care professional.These medicines may hide a fever. Talk to your health care professional about your risk of cancer. You may bemore at risk for certain types of cancer if you take this medicine. If you are going to need surgery or other procedure, tell your health careprofessional that you are using this medicine. Be careful brushing or flossing your teeth or using a toothpick because you may get an infection or bleed more easily. If you have any dental work done, Primary school teacher you are receiving this medicine. What side effects may I notice from receiving this medication? Side effects that you should report to your doctor or health care professionalas soon as possible: allergic reactions like skin rash, itching or hives, swelling of the face, lips, or tongue breathing problems nausea, vomiting signs and symptoms of bleeding such as bloody or black, tarry stools; red or dark brown urine; spitting up blood or brown material  that looks like coffee grounds; red spots on the skin; unusual bruising or bleeding from the eyes, gums, or nose signs and symptoms of heart failure like fast, irregular heartbeat, sudden weight gain; swelling of the ankles, feet, hands signs and symptoms of infection like fever; chills; cough; sore throat; pain or trouble passing urine signs and symptoms of kidney injury like trouble passing urine or change in the amount of urine signs and symptoms of liver injury like dark yellow or brown urine; general ill feeling or flu-like symptoms; light-colored stools; loss of appetite; nausea; right upper belly pain; unusually weak or tired; yellowing of the eyes or skin Side effects that usually do not require medical attention (report to yourdoctor or health care professional  if they continue or are bothersome): confusion decreased hearing diarrhea facial flushing hair loss headache loss of appetite missed menstrual periods signs and symptoms of low red blood cells or anemia such as unusually weak or tired; feeling faint or lightheaded; falls skin discoloration This list may not describe all possible side effects. Call your doctor for medical advice about side effects. You may report side effects to FDA at1-800-FDA-1088. Where should I keep my medication? This drug is given in a hospital or clinic and will not be stored at home. NOTE: This sheet is a summary. It may not cover all possible information. If you have questions about this medicine, talk to your doctor, pharmacist, orhealth care provider.  2022 Elsevier/Gold Standard (2019-06-10 09:53:29)  Vincristine injection What is this medication? VINCRISTINE (vin KRIS teen) is a chemotherapy drug. It slows the growth of cancer cells. This medicine is used to treat many types of cancer like Hodgkin's disease, leukemia, non-Hodgkin's lymphoma, neuroblastoma (braincancer), rhabdomyosarcoma, and Wilms' tumor. This medicine may be used for other purposes; ask your health care provider orpharmacist if you have questions. COMMON BRAND NAME(S): Oncovin, Vincasar PFS What should I tell my care team before I take this medication? They need to know if you have any of these conditions: blood disorders gout infection (especially chickenpox, cold sores, or herpes) kidney disease liver disease lung disease nervous system disease like Charcot-Marie-Tooth (CMT) recent or ongoing radiation therapy an unusual or allergic reaction to vincristine, other chemotherapy agents, other medicines, foods, dyes, or preservatives pregnant or trying to get pregnant breast-feeding How should I use this medication? This drug is given as an infusion into a vein. It is administered in a hospital or clinic by a specially trained health  care professional. If you have pain, swelling, burning, or any unusual feeling around the site of your injection,tell your health care professional right away. Talk to your pediatrician regarding the use of this medicine in children. Whilethis drug may be prescribed for selected conditions, precautions do apply. Overdosage: If you think you have taken too much of this medicine contact apoison control center or emergency room at once. NOTE: This medicine is only for you. Do not share this medicine with others. What if I miss a dose? It is important not to miss your dose. Call your doctor or health careprofessional if you are unable to keep an appointment. What may interact with this medication? certain medicines for fungal infections like itraconazole, ketoconazole, posaconazole, voriconazole certain medicines for seizures like phenytoin This list may not describe all possible interactions. Give your health care provider a list of all the medicines, herbs, non-prescription drugs, or dietary supplements you use. Also tell them if you smoke, drink alcohol, or use illegaldrugs. Some items may interact with your  medicine. What should I watch for while using this medication? This drug may make you feel generally unwell. This is not uncommon, as chemotherapy can affect healthy cells as well as cancer cells. Report any side effects. Continue your course of treatment even though you feel ill unless yourdoctor tells you to stop. You may need blood work done while you are taking this medicine. This medicine will cause constipation. Try to have a bowel movement at least every 2 to 3 days. If you do not have a bowel movement for 3 days, call yourdoctor or health care professional. In some cases, you may be given additional medicines to help with side effects.Follow all directions for their use. Do not become pregnant while taking this medicine. Women should inform their doctor if they wish to become pregnant or  think they might be pregnant. There is a potential for serious side effects to an unborn child. Talk to your health care professional or pharmacist for more information. Do not breast-feed aninfant while taking this medicine. This medicine may make it more difficult to get pregnant or to father a child.Talk to your healthcare professional if you are concerned about your fertility. What side effects may I notice from receiving this medication? Side effects that you should report to your doctor or health care professionalas soon as possible: allergic reactions like skin rash, itching or hives, swelling of the face, lips, or tongue breathing problems confusion or changes in emotions or moods constipation cough mouth sores muscle weakness nausea and vomiting pain, swelling, redness or irritation at the injection site pain, tingling, numbness in the hands or feet problems with balance, talking, walking seizures stomach pain trouble passing urine or change in the amount of urine Side effects that usually do not require medical attention (report to yourdoctor or health care professional if they continue or are bothersome): diarrhea hair loss jaw pain loss of appetite This list may not describe all possible side effects. Call your doctor for medical advice about side effects. You may report side effects to FDA at1-800-FDA-1088. Where should I keep my medication? This drug is given in a hospital or clinic and will not be stored at home. NOTE: This sheet is a summary. It may not cover all possible information. If you have questions about this medicine, talk to your doctor, pharmacist, orhealth care provider.  2022 Elsevier/Gold Standard (2019-08-06 17:05:13)

## 2021-03-30 ENCOUNTER — Telehealth: Payer: Self-pay | Admitting: *Deleted

## 2021-03-30 DIAGNOSIS — Z85828 Personal history of other malignant neoplasm of skin: Secondary | ICD-10-CM | POA: Diagnosis not present

## 2021-03-30 DIAGNOSIS — C4442 Squamous cell carcinoma of skin of scalp and neck: Secondary | ICD-10-CM | POA: Diagnosis not present

## 2021-03-30 NOTE — Telephone Encounter (Signed)
Called pt to see how he did with his treatment yest.  He reports doing fine & denies any problems & states he knows how to reach [place] if needed.

## 2021-03-30 NOTE — Telephone Encounter (Signed)
-----   Message from Lester [place], RN sent at 03/29/2021  2:59 PM EDT ----- Regarding: Dr. Alvy Bimler Pt. first time [facility]. Had Rituxumab before, but first time for everything else. Tolerated treatment well, no issues noted.

## 2021-03-31 ENCOUNTER — Other Ambulatory Visit: Payer: Self-pay

## 2021-03-31 ENCOUNTER — Inpatient Hospital Stay: Payer: Medicare Other

## 2021-03-31 VITALS — BP 135/56 | HR 58 | Temp 97.8°F | Resp 20

## 2021-03-31 DIAGNOSIS — I10 Essential (primary) hypertension: Secondary | ICD-10-CM | POA: Diagnosis not present

## 2021-03-31 DIAGNOSIS — Z5189 Encounter for other specified aftercare: Secondary | ICD-10-CM | POA: Diagnosis not present

## 2021-03-31 DIAGNOSIS — Z7189 Other specified counseling: Secondary | ICD-10-CM

## 2021-03-31 DIAGNOSIS — E782 Mixed hyperlipidemia: Secondary | ICD-10-CM | POA: Diagnosis not present

## 2021-03-31 DIAGNOSIS — Z5112 Encounter for antineoplastic immunotherapy: Secondary | ICD-10-CM | POA: Diagnosis not present

## 2021-03-31 DIAGNOSIS — N182 Chronic kidney disease, stage 2 (mild): Secondary | ICD-10-CM | POA: Diagnosis not present

## 2021-03-31 DIAGNOSIS — C8330 Diffuse large B-cell lymphoma, unspecified site: Secondary | ICD-10-CM | POA: Diagnosis not present

## 2021-03-31 DIAGNOSIS — N400 Enlarged prostate without lower urinary tract symptoms: Secondary | ICD-10-CM | POA: Diagnosis not present

## 2021-03-31 DIAGNOSIS — Z5111 Encounter for antineoplastic chemotherapy: Secondary | ICD-10-CM | POA: Diagnosis not present

## 2021-03-31 DIAGNOSIS — N183 Chronic kidney disease, stage 3 unspecified: Secondary | ICD-10-CM | POA: Diagnosis not present

## 2021-03-31 DIAGNOSIS — N3021 Other chronic cystitis with hematuria: Secondary | ICD-10-CM | POA: Diagnosis not present

## 2021-03-31 DIAGNOSIS — E1122 Type 2 diabetes mellitus with diabetic chronic kidney disease: Secondary | ICD-10-CM | POA: Diagnosis not present

## 2021-03-31 DIAGNOSIS — E039 Hypothyroidism, unspecified: Secondary | ICD-10-CM | POA: Diagnosis not present

## 2021-03-31 DIAGNOSIS — I2510 Atherosclerotic heart disease of native coronary artery without angina pectoris: Secondary | ICD-10-CM | POA: Diagnosis not present

## 2021-03-31 DIAGNOSIS — C8338 Diffuse large B-cell lymphoma, lymph nodes of multiple sites: Secondary | ICD-10-CM

## 2021-03-31 MED ORDER — PEGFILGRASTIM-JMDB 6 MG/0.6ML ~~LOC~~ SOSY
6.0000 mg | PREFILLED_SYRINGE | Freq: Once | SUBCUTANEOUS | Status: AC
  Administered 2021-03-31: 6 mg via SUBCUTANEOUS

## 2021-03-31 MED ORDER — PEGFILGRASTIM-JMDB 6 MG/0.6ML ~~LOC~~ SOSY
PREFILLED_SYRINGE | SUBCUTANEOUS | Status: AC
  Filled 2021-03-31: qty 0.6

## 2021-03-31 NOTE — Patient Instructions (Signed)

## 2021-04-19 ENCOUNTER — Inpatient Hospital Stay (HOSPITAL_BASED_OUTPATIENT_CLINIC_OR_DEPARTMENT_OTHER): Payer: Medicare Other | Admitting: Hematology and Oncology

## 2021-04-19 ENCOUNTER — Inpatient Hospital Stay: Payer: Medicare Other

## 2021-04-19 ENCOUNTER — Ambulatory Visit (HOSPITAL_COMMUNITY)
Admission: RE | Admit: 2021-04-19 | Discharge: 2021-04-19 | Disposition: A | Discharge status: Home / Self Care | Payer: Medicare Other | Source: Ambulatory Visit | Attending: Hematology and Oncology | Admitting: Hematology and Oncology

## 2021-04-19 ENCOUNTER — Inpatient Hospital Stay: Payer: Medicare Other | Attending: Hematology and Oncology

## 2021-04-19 ENCOUNTER — Encounter: Payer: Self-pay | Admitting: Hematology and Oncology

## 2021-04-19 ENCOUNTER — Other Ambulatory Visit: Payer: Self-pay | Admitting: Hematology and Oncology

## 2021-04-19 ENCOUNTER — Other Ambulatory Visit: Payer: Self-pay

## 2021-04-19 VITALS — BP 150/64 | HR 55 | Temp 97.7°F | Resp 18 | Ht 76.0 in | Wt 309.0 lb

## 2021-04-19 DIAGNOSIS — C8338 Diffuse large B-cell lymphoma, lymph nodes of multiple sites: Secondary | ICD-10-CM

## 2021-04-19 DIAGNOSIS — I700 Atherosclerosis of aorta: Secondary | ICD-10-CM | POA: Diagnosis not present

## 2021-04-19 DIAGNOSIS — Z7189 Other specified counseling: Secondary | ICD-10-CM

## 2021-04-19 DIAGNOSIS — D638 Anemia in other chronic diseases classified elsewhere: Secondary | ICD-10-CM | POA: Insufficient documentation

## 2021-04-19 DIAGNOSIS — E669 Obesity, unspecified: Secondary | ICD-10-CM | POA: Insufficient documentation

## 2021-04-19 DIAGNOSIS — C7801 Secondary malignant neoplasm of right lung: Secondary | ICD-10-CM | POA: Diagnosis not present

## 2021-04-19 DIAGNOSIS — R059 Cough, unspecified: Secondary | ICD-10-CM

## 2021-04-19 DIAGNOSIS — C8590 Non-Hodgkin lymphoma, unspecified, unspecified site: Secondary | ICD-10-CM | POA: Diagnosis not present

## 2021-04-19 DIAGNOSIS — Z5111 Encounter for antineoplastic chemotherapy: Secondary | ICD-10-CM | POA: Insufficient documentation

## 2021-04-19 DIAGNOSIS — N184 Chronic kidney disease, stage 4 (severe): Secondary | ICD-10-CM | POA: Diagnosis not present

## 2021-04-19 DIAGNOSIS — Z79899 Other long term (current) drug therapy: Secondary | ICD-10-CM | POA: Diagnosis not present

## 2021-04-19 DIAGNOSIS — Z5112 Encounter for antineoplastic immunotherapy: Secondary | ICD-10-CM | POA: Diagnosis not present

## 2021-04-19 DIAGNOSIS — Z5189 Encounter for other specified aftercare: Secondary | ICD-10-CM | POA: Diagnosis not present

## 2021-04-19 DIAGNOSIS — C9110 Chronic lymphocytic leukemia of B-cell type not having achieved remission: Secondary | ICD-10-CM

## 2021-04-19 DIAGNOSIS — I517 Cardiomegaly: Secondary | ICD-10-CM | POA: Diagnosis not present

## 2021-04-19 LAB — CBC WITH DIFFERENTIAL (CANCER CENTER ONLY)
Abs Immature Granulocytes: 0.15 10*3/uL — ABNORMAL HIGH (ref 0.00–0.07)
Basophils Absolute: 0.1 10*3/uL (ref 0.0–0.1)
Basophils Relative: 1 %
Eosinophils Absolute: 0.1 10*3/uL (ref 0.0–0.5)
Eosinophils Relative: 1 %
HCT: 35.7 % — ABNORMAL LOW (ref 39.0–52.0)
Hemoglobin: 12.1 g/dL — ABNORMAL LOW (ref 13.0–17.0)
Immature Granulocytes: 2 %
Lymphocytes Relative: 10 %
Lymphs Abs: 1 10*3/uL (ref 0.7–4.0)
MCH: 29.2 pg (ref 26.0–34.0)
MCHC: 33.9 g/dL (ref 30.0–36.0)
MCV: 86.2 fL (ref 80.0–100.0)
Monocytes Absolute: 0.8 10*3/uL (ref 0.1–1.0)
Monocytes Relative: 8 %
Neutro Abs: 7.7 10*3/uL (ref 1.7–7.7)
Neutrophils Relative %: 78 %
Platelet Count: 178 10*3/uL (ref 150–400)
RBC: 4.14 MIL/uL — ABNORMAL LOW (ref 4.22–5.81)
RDW: 14.7 % (ref 11.5–15.5)
WBC Count: 9.9 10*3/uL (ref 4.0–10.5)
nRBC: 0 % (ref 0.0–0.2)

## 2021-04-19 LAB — CMP (CANCER CENTER ONLY)
ALT: 13 U/L (ref 0–44)
AST: 13 U/L — ABNORMAL LOW (ref 15–41)
Albumin: 3.6 g/dL (ref 3.5–5.0)
Alkaline Phosphatase: 103 U/L (ref 38–126)
Anion gap: 7 (ref 5–15)
BUN: 17 mg/dL (ref 8–23)
CO2: 23 mmol/L (ref 22–32)
Calcium: 9.5 mg/dL (ref 8.9–10.3)
Chloride: 108 mmol/L (ref 98–111)
Creatinine: 1.94 mg/dL — ABNORMAL HIGH (ref 0.61–1.24)
GFR, Estimated: 35 mL/min — ABNORMAL LOW (ref >60)
Glucose, Bld: 140 mg/dL — ABNORMAL HIGH (ref 70–99)
Potassium: 4.9 mmol/L (ref 3.5–5.1)
Sodium: 138 mmol/L (ref 135–145)
Total Bilirubin: 0.7 mg/dL (ref 0.3–1.2)
Total Protein: 6.8 g/dL (ref 6.5–8.1)

## 2021-04-19 MED ORDER — SODIUM CHLORIDE 0.9 % IV SOLN
375.0000 mg/m2 | Freq: Once | INTRAVENOUS | Status: AC
  Administered 2021-04-19: 1020 mg via INTRAVENOUS
  Filled 2021-04-19: qty 51

## 2021-04-19 MED ORDER — SODIUM CHLORIDE 0.9% FLUSH
10.0000 mL | Freq: Once | INTRAVENOUS | Status: AC
  Administered 2021-04-19: 10 mL
  Filled 2021-04-19: qty 10

## 2021-04-19 MED ORDER — DIPHENHYDRAMINE HCL 25 MG PO CAPS
ORAL_CAPSULE | ORAL | Status: AC
  Filled 2021-04-19: qty 1

## 2021-04-19 MED ORDER — VINCRISTINE SULFATE CHEMO INJECTION 1 MG/ML
1.0000 mg | Freq: Once | INTRAVENOUS | Status: AC
  Administered 2021-04-19: 1 mg via INTRAVENOUS
  Filled 2021-04-19: qty 1

## 2021-04-19 MED ORDER — HEPARIN SOD (PORK) LOCK FLUSH 100 UNIT/ML IV SOLN
500.0000 [IU] | Freq: Once | INTRAVENOUS | Status: AC | PRN
  Administered 2021-04-19: 500 [IU]
  Filled 2021-04-19: qty 5

## 2021-04-19 MED ORDER — SODIUM CHLORIDE 0.9 % IV SOLN
150.0000 mg | Freq: Once | INTRAVENOUS | Status: AC
  Administered 2021-04-19: 150 mg via INTRAVENOUS
  Filled 2021-04-19: qty 150

## 2021-04-19 MED ORDER — SODIUM CHLORIDE 0.9 % IV SOLN
10.0000 mg | Freq: Once | INTRAVENOUS | Status: AC
  Administered 2021-04-19: 10 mg via INTRAVENOUS
  Filled 2021-04-19: qty 10

## 2021-04-19 MED ORDER — PALONOSETRON HCL INJECTION 0.25 MG/5ML
0.2500 mg | Freq: Once | INTRAVENOUS | Status: AC
  Administered 2021-04-19: 0.25 mg via INTRAVENOUS

## 2021-04-19 MED ORDER — DIPHENHYDRAMINE HCL 25 MG PO CAPS
25.0000 mg | ORAL_CAPSULE | Freq: Once | ORAL | Status: AC
  Administered 2021-04-19: 25 mg via ORAL

## 2021-04-19 MED ORDER — ACETAMINOPHEN 325 MG PO TABS
650.0000 mg | ORAL_TABLET | Freq: Once | ORAL | Status: AC
  Administered 2021-04-19: 650 mg via ORAL

## 2021-04-19 MED ORDER — PALONOSETRON HCL INJECTION 0.25 MG/5ML
INTRAVENOUS | Status: AC
  Filled 2021-04-19: qty 5

## 2021-04-19 MED ORDER — SODIUM CHLORIDE 0.9 % IV SOLN
Freq: Once | INTRAVENOUS | Status: AC
  Filled 2021-04-19: qty 250

## 2021-04-19 MED ORDER — DOXORUBICIN HCL CHEMO IV INJECTION 2 MG/ML
25.0000 mg/m2 | Freq: Once | INTRAVENOUS | Status: AC
  Administered 2021-04-19: 68 mg via INTRAVENOUS
  Filled 2021-04-19: qty 34

## 2021-04-19 MED ORDER — SODIUM CHLORIDE 0.9% FLUSH
10.0000 mL | INTRAVENOUS | Status: DC | PRN
  Administered 2021-04-19: 10 mL
  Filled 2021-04-19: qty 10

## 2021-04-19 MED ORDER — SODIUM CHLORIDE 0.9 % IV SOLN
375.0000 mg/m2 | Freq: Once | INTRAVENOUS | Status: AC
  Administered 2021-04-19: 1000 mg via INTRAVENOUS
  Filled 2021-04-19: qty 100

## 2021-04-19 MED ORDER — ACETAMINOPHEN 325 MG PO TABS
ORAL_TABLET | ORAL | Status: AC
  Filled 2021-04-19: qty 2

## 2021-04-19 NOTE — Assessment & Plan Note (Signed)
He has progressive weight loss due to intensive dietary changes and lifestyle modification, not from side effects of treatment I will reduce the dose of chemotherapy accordingly Due to his cough, I will order chest x-ray for evaluation I recommend minimum 3 cycles of treatment before repeating PET/CT imaging

## 2021-04-19 NOTE — Assessment & Plan Note (Signed)
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

## 2021-04-19 NOTE — Progress Notes (Signed)
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 The patient denies recent history of bleeding such as epistaxis, hematuria or hematochezia. He is asymptomatic from the anemia. We will observe for now.    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  He has significant class II obesity but is making progress in weight loss effort I would reduce the dose of his chemotherapy accordingly  Chronic kidney disease (CKD), 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HCC) He has severe chronic kidney disease due to other risk factors As above, I plan upfront dose adjustment; he tolerated recent dose adjustment well and we will continue the same We discussed importance of aggressive fluid hydration while on chemotherapy  Orders Placed This Encounter  Procedures   DG Chest 2 View    Standing Status:   Future    Standing Expiration Date:   04/19/2022    Order Specific Question:    Reason for exam:    Answer:   cough, lymphoma treatment, hx lung biopsy    Order Specific Question:   Preferred imaging location?    Answer:   [facility]    All questions were answered. The patient knows to call the clinic with any problems, questions or concerns. The total time spent in the appointment was 30 minutes encounter with patients including review of chart and various tests results, discussions about plan of care and coordination of care plan   Derek Lark, MD 04/19/2021 10:04 AM  INTERVAL HISTORY: Please see below for problem oriented charting. He returns for cycle 2 of treatment Since last time I saw him, he has lost 8 pounds of weight He lost his weight by stopping his alcohol intake and making lifestyle changes He denies side effects from chemo such as nausea or vomiting or constipation No peripheral neuropathy The only thing that he is most bothered is cough when he lies down at night Usually he has mild production of phlegm but no hemoptysis He denies shortness of breath on exertion  SUMMARY OF ONCOLOGIC HISTORY: Oncology History Overview Note  Normal FISH for CLL DLBCL transformed from CLL; c-myc by FISH and other rearrangements were not detected   Diffuse large B cell lymphoma ([place])  09/10/2013 Initial Diagnosis   CLL (chronic lymphocytic leukemia)    12/26/2013 Pathology Results   FISH analysis was normal.    06/17/2014 Procedure   The patient has placement of Infuse-a-Port.    06/18/2014 Imaging   CT scan of the chest, abdomen and pelvis showed  diffuse lymphadenopathy and splenomegaly.    06/19/2014 Bone Marrow Biopsy   Bone marrow aspirate and biopsy show CLL.    06/24/2014 - 11/18/2014 Chemotherapy   He received 6 cycles of Obinutuzumab and chlorambucil    09/15/2014 Imaging   Repeat CT scan of the chest, abdomen and pelvis show greater than 50% reduction in lymphadenopathy and splenomegaly    12/19/2014 Imaging   Repeat CT scan showed  complete resolution of lymphadenopathy and splenomegaly.    04/22/2020 Imaging   CT neck 1. Cervical adenopathy involving the bilateral level 1B, right 2/3 and bilateral level 5 nodal stations. 2. Prominent to mildly enlarged upper mediastinal nodes. 3. Prominent subcentimeter right intraparotid node.     06/03/2020 -  Chemotherapy   The patient had rituximab for chemotherapy treatment.     08/31/2020 Imaging   CT neck 1. Regression of Leukemia. Resolved widespread cervical lymphadenopathy since August. Largest residual left level IIIb node now 8-9 mm short axis (previously 16 mm). 2.  CT Chest, Abdomen, and Pelvis today are reported separately.     08/31/2020 Imaging   Limited evaluation due to lack of intravenous contrast administration.   7.1 cm left perirenal soft tissue mass along the posterior left upper kidney, suspicious for perirenal lymphoma, although poorly evaluated. This is essentially new from May 2021.   Possible periureteral soft tissue along the right proximal collecting system/ureter, equivocal.   New 12 mm right lower lobe pulmonary nodule, suspicious for metastasis/pulmonary lymphoma. Additional 6 mm (mean diameter) subpleural nodule in the lingula is new from 2016, indeterminate.   No suspicious lymphadenopathy in the chest, abdomen, or pelvis. Spleen is normal in size.     02/05/2021 Imaging   1. Interval enlargement of multiple subcutaneous soft tissue nodules or lymph nodes, in the left axilla and overlying the sacrum and left buttock. 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 2. Significant interval increase in size of right lower lobe and lingular pulmonary nodules, now with large subpleural opacities, consistent with worsened pulmonary lymphomatous involvement. 3. Interval decrease in size of a left perinephric soft tissue mass.  4. Constellation of findings above suggest mixed response to treatment. 5. Mild pulmonary fibrosis in a pattern with  apical to basal gradient, featuring irregular peripheral interstitial opacity and septal thickening with some scattered areas of subpleural bronchiolectasis. No clear evidence of honeycombing. 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 Atkins, Iss 5, [id number], May 20 2017. 6. Coronary artery disease.   02/05/2021 Pathology Results   A. 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  The appearance is diffuse with lack of atypical follicles.  Flow cytometric analysis was attempted ([id number]) but there were insufficient cells present in the sample for analysis. Hence a battery of immunohistochemical stains was performed and shows that the atypical lymphoid cells are positive for CD20, CD79a, PAX5, BCL-2, and MUM 1.  Only scattered cells are positive for cytoplasmic kappa and not lambda.  There is patchy weak staining for BCL6.  No significant staining is seen with CD10, CD30, CD34,  CD138, cyclin D1, or EBV in situ hybridization.  Ki-67 shows variable increased expression ranging from 30% to over 50% in some areas.  There is an admixed T-cell  population in the background to a lesser extent as seen with CD3 and CD5 and there is no apparent co-expression of CD5 in B-cell areas.  The findings are most consistent with involvement by large B-cell lymphoma,  ABC type.  Given the previous history of chronic lymphocytic leukemia/small lymphocytic lymphoma, this  likely represents high-grade transformation.  Clinical correlation is recommended   02/26/2021 PET scan   1. Evidence of multi organ lymphoma recurrence. 2. Enlarging foci of hypermetabolic peripheral consolidation in both lungs consistent pulmonary lymphoma. 3. Multiple foci of hypermetabolic subcutaneous nodularity consistent with subcutaneous lymphoma. 4. Solitary hypermetabolic nodal metastasis in the central mesentery and peritoneal implant along the RIGHT iliacus muscle. 5. THree foci of metabolic activity in the skeleton consistent with skeletal lymphoma.     03/08/2021 Procedure   Status post CT-guided biopsy of right lower lobe lung mass   03/12/2021 Cancer Staging   Staging form: Hodgkin and Non-Hodgkin Lymphoma, AJCC 7th Edition - Clinical stage from 03/12/2021: Stage IV - Signed by Derek Lark, MD on 03/12/2021  Staged by: Managing physician  Stage prefix: Recurrence  Biopsy of metastatic site performed: Yes  Source of metastatic specimen: Lung    03/17/2021 Echocardiogram    1. Left ventricular ejection fraction, by estimation, is 55 to 60%. The left ventricle has normal function. The left ventricle has no regional wall motion abnormalities. There is moderate concentric left ventricular hypertrophy. Left ventricular diastolic parameters are indeterminate. The average left ventricular global longitudinal strain is -20.4 %. The global longitudinal strain is normal.  2. Right ventricular systolic function is normal. The right ventricular size is normal. Tricuspid regurgitation signal is inadequate for assessing PA pressure.  3. The mitral valve is grossly normal. Mild mitral valve regurgitation. No evidence of mitral stenosis.  4. The aortic valve is tricuspid. There is mild calcification of the aortic valve. Aortic valve regurgitation is not visualized. Mild aortic valve sclerosis is present, with no evidence of aortic valve stenosis.  5. The inferior vena cava is normal in size  with greater than 50% respiratory variability, suggesting right atrial pressure of 3 mmHg.   03/19/2021 Procedure   Successful placement of a right internal jugular approach power injectable Port-A-Cath. The catheter is ready for immediate use.     03/29/2021 -  Chemotherapy    Patient is on Treatment Plan: NON-HODGKINS LYMPHOMA R-CHOP Q21D         REVIEW OF SYSTEMS:   Constitutional: Denies fevers, chills  Eyes: Denies blurriness of vision Ears, nose, mouth, throat, and face: Denies mucositis or sore throat Cardiovascular: Denies palpitation, chest discomfort or lower extremity swelling Gastrointestinal:  Denies nausea, heartburn or change in bowel habits Skin: Denies abnormal skin rashes Lymphatics: Denies new lymphadenopathy or easy bruising Neurological:Denies numbness, tingling or new weaknesses Behavioral/Psych: Mood is stable, no new changes  All other systems were reviewed with the patient and are negative.  I have reviewed the past medical history, past surgical history, social history and family history with the patient and they are unchanged from previous note.  ALLERGIES:  is allergic to lipitor [atorvastatin] and metformin.  MEDICATIONS:  Current Outpatient Medications  Medication Sig Dispense Refill   acyclovir (ZOVIRAX) 400 MG tablet Take 1 tablet (400 mg total) by mouth daily. 30 tablet 3   allopurinol (ZYLOPRIM) 300 MG tablet TAKE  1 TABLET BY MOUTH EVERY DAY 90 tablet 1   amiodarone (PACERONE) 200 MG tablet Take 1 tablet (200 mg total) by mouth daily. 60 tablet 3   doxepin (SINEQUAN) 10 MG capsule Take 10 mg by mouth at bedtime.     glimepiride (AMARYL) 2 MG tablet Take 2 mg by mouth daily with breakfast.     glucose blood test strip Dispense glucometer, strips and lancets preferred by patient's insurance (DX: E11.9 / type 2 DM)     levothyroxine (SYNTHROID) 25 MCG tablet Take 25 mcg by mouth daily before breakfast.     lidocaine-prilocaine (EMLA) cream Apply to  affected area once 30 g 3   Needle, Disp, (HYPODERMIC NEEDLE 26GX5/8") 26G X 5/8" MISC See admin instructions.     Omega-3 Fatty Acids (FISH OIL PO) Take 2 capsules by mouth daily.     ondansetron (ZOFRAN) 8 MG tablet Take 8 mg by mouth every 8 (eight) hours as needed.     predniSONE (DELTASONE) 20 MG tablet Take 2 tablets (40 mg total) by mouth daily. Take with food on for 4 days after chemotherapy every 3 weeks 8 tablet 5   prochlorperazine (COMPAZINE) 10 MG tablet Take 1 tablet (10 mg total) by mouth every 6 (six) hours as needed for nausea or vomiting. 30 tablet 0   rivaroxaban (XARELTO) 20 MG TABS tablet Take 1 tablet (20 mg total) by mouth daily with supper. 30 tablet 6   rosuvastatin (CRESTOR) 10 MG tablet Take 10 mg by mouth daily.     tamsulosin (FLOMAX) 0.4 MG CAPS capsule Take 0.4 mg by mouth daily.     No current facility-administered medications for this visit.    PHYSICAL EXAMINATION: ECOG PERFORMANCE STATUS: 1 - Symptomatic but completely ambulatory  Vitals:   04/19/21 0951  BP: (!) 150/64  Pulse: (!) 55  Resp: 18  Temp: 97.7 F (36.5 C)  SpO2: 96%   Filed Weights   04/19/21 0951  Weight: (!) 309 lb (140.2 kg)    GENERAL:alert, no distress and comfortable SKIN: skin color, texture, turgor are normal, no rashes or significant lesions.  Noted treated skin lesions EYES: normal, Conjunctiva are pink and non-injected, sclera clear OROPHARYNX:no exudate, no erythema and lips, buccal mucosa, and tongue normal  NECK: supple, thyroid normal size, non-tender, without nodularity LYMPH:  no palpable lymphadenopathy in the cervical, axillary or inguinal LUNGS: clear to auscultation and percussion with normal breathing effort HEART: regular rate & rhythm and no murmurs and no lower extremity edema ABDOMEN:abdomen soft, non-tender and normal bowel sounds Musculoskeletal:no cyanosis of digits and no clubbing  NEURO: alert & oriented x 3 with fluent speech, no focal  motor/sensory deficits  LABORATORY DATA:  I have reviewed the data as listed    Component Value Date/Time   NA 138 03/23/2021 1107   NA 138 10/25/2018 1120   NA 140 06/25/2015 0758   K 4.2 03/23/2021 1107   K 3.9 06/25/2015 0758   CL 107 03/23/2021 1107   CL 101 03/11/2013 1031   CO2 22 03/23/2021 1107   CO2 25 06/25/2015 0758   GLUCOSE 198 (H) 03/23/2021 1107   GLUCOSE 140 06/25/2015 0758   GLUCOSE 173 (H) 03/11/2013 1031   BUN 22 03/23/2021 1107   BUN 16 10/25/2018 1120   BUN 15.9 06/25/2015 0758   CREATININE 2.40 (H) 03/23/2021 1107   CREATININE 1.0 06/25/2015 0758   CALCIUM 9.3 03/23/2021 1107   CALCIUM 9.3 06/25/2015 0758   PROT 7.1 03/23/2021 1107  PROT 6.7 06/25/2015 0758   ALBUMIN 3.5 03/23/2021 1107   ALBUMIN 3.8 06/25/2015 0758   AST 17 03/23/2021 1107   AST 15 06/25/2015 0758   ALT 14 03/23/2021 1107   ALT 14 06/25/2015 0758   ALKPHOS 68 03/23/2021 1107   ALKPHOS 61 06/25/2015 0758   BILITOT 0.6 03/23/2021 1107   BILITOT 0.95 06/25/2015 0758   GFRNONAA 27 (L) 03/23/2021 1107   GFRAA 29 (L) 06/09/2020 0847   GFRAA 32 (L) 06/01/2020 0957    No results found for: SPEP, UPEP  Lab Results  Component Value Date   WBC 9.9 04/19/2021   NEUTROABS 7.7 04/19/2021   HGB 12.1 (L) 04/19/2021   HCT 35.7 (L) 04/19/2021   MCV 86.2 04/19/2021   PLT 178 04/19/2021      Chemistry      Component Value Date/Time   NA 138 03/23/2021 1107   NA 138 10/25/2018 1120   NA 140 06/25/2015 0758   K 4.2 03/23/2021 1107   K 3.9 06/25/2015 0758   CL 107 03/23/2021 1107   CL 101 03/11/2013 1031   CO2 22 03/23/2021 1107   CO2 25 06/25/2015 0758   BUN 22 03/23/2021 1107   BUN 16 10/25/2018 1120   BUN 15.9 06/25/2015 0758   CREATININE 2.40 (H) 03/23/2021 1107   CREATININE 1.0 06/25/2015 0758      Component Value Date/Time   CALCIUM 9.3 03/23/2021 1107   CALCIUM 9.3 06/25/2015 0758   ALKPHOS 68 03/23/2021 1107   ALKPHOS 61 06/25/2015 0758   AST 17 03/23/2021 1107    AST 15 06/25/2015 0758   ALT 14 03/23/2021 1107   ALT 14 06/25/2015 0758   BILITOT 0.6 03/23/2021 1107   BILITOT 0.95 06/25/2015 0758

## 2021-04-19 NOTE — Patient Instructions (Signed)
[facility] MEDICAL ONCOLOGY  Discharge Instructions: Thank you for choosing [facility] to provide your oncology and hematology care.   If you have a lab appointment with the Cancer Center, please go directly to the Cancer Center and check in at the registration area.   Wear comfortable clothing and clothing appropriate for easy access to any Portacath or PICC line.   We strive to give you quality time with your provider. You may need to reschedule your appointment if you arrive late (15 or more minutes).  Arriving late affects you and other patients whose appointments are after yours.  Also, if you miss three or more appointments without notifying the office, you may be dismissed from the clinic at the provider's discretion.      For prescription refill requests, have your pharmacy contact our office and allow 72 hours for refills to be completed.    Today you received the following chemotherapy and/or immunotherapy agents Rituxan,Adriamycin,Vincristine,Cytoxan      To help prevent nausea and vomiting after your treatment, we encourage you to take your nausea medication as directed.  BELOW ARE SYMPTOMS THAT SHOULD BE REPORTED IMMEDIATELY: *FEVER GREATER THAN 100.4 F (38 C) OR HIGHER *CHILLS OR SWEATING *NAUSEA AND VOMITING THAT IS NOT CONTROLLED WITH YOUR NAUSEA MEDICATION *UNUSUAL SHORTNESS OF BREATH *UNUSUAL BRUISING OR BLEEDING *URINARY PROBLEMS (pain or burning when urinating, or frequent urination) *BOWEL PROBLEMS (unusual diarrhea, constipation, pain near the anus) TENDERNESS IN MOUTH AND THROAT WITH OR WITHOUT PRESENCE OF ULCERS (sore throat, sores in mouth, or a toothache) UNUSUAL RASH, SWELLING OR PAIN  UNUSUAL VAGINAL DISCHARGE OR ITCHING   Items with * indicate a potential emergency and should be followed up as soon as possible or go to the Emergency Department if any problems should occur.  Please show the CHEMOTHERAPY ALERT CARD or  IMMUNOTHERAPY ALERT CARD at check-in to the Emergency Department and triage nurse.  Should you have questions after your visit or need to cancel or reschedule your appointment, please contact [facility]  Dept: [phone]  and follow the prompts.  Office hours are 8:00 a.m. to 4:30 p.m. Monday - Friday. Please note that voicemails left after 4:00 p.m. may not be returned until the following business day.  We are closed weekends and major holidays. You have access to a nurse at all times for urgent questions. Please call the main number to the clinic Dept: [phone] and follow the prompts.   For any non-urgent questions, you may also contact your provider using MyChart. We now offer e-Visits for anyone 18 and older to request care online for non-urgent symptoms. For details visit [email or web address].   Also download the MyChart app! Go to the app store, search "MyChart", open the app, select [facility], and log in with your MyChart username and password.  Due to Covid, a mask is required upon entering the hospital/clinic. If you do not have a mask, one will be given to you upon arrival. For doctor visits, patients may have 1 support person aged 18 or older with them. For treatment visits, patients cannot have anyone with them due to current Covid guidelines and our immunocompromised population.   

## 2021-04-19 NOTE — Assessment & Plan Note (Signed)
This is likely anemia of chronic disease. The patient denies recent history of bleeding such as epistaxis, hematuria or hematochezia. He is asymptomatic from the anemia. We will observe for now.  

## 2021-04-19 NOTE — Assessment & Plan Note (Addendum)
He has cough especially at nighttime but denies excessive shortness of breath, fever or chills to suggest infection His examination is benign and his oxygen saturation is normal I will order a chest x-ray after his treatment today but we will not delay his treatment I will call him with test results He will continue acyclovir for antimicrobial prophylaxis His examination does not suggest congestive heart failure

## 2021-04-19 NOTE — Assessment & Plan Note (Signed)
He has significant class II obesity but is making progress in weight loss effort I would reduce the dose of his chemotherapy accordingly

## 2021-04-20 ENCOUNTER — Telehealth: Payer: Self-pay | Admitting: *Deleted

## 2021-04-20 NOTE — Telephone Encounter (Signed)
Contacted patient at Dr. Calton Dach request: "Please call to let him know CXR results are good, better. His cough is due to his lungs improving". Call reached un named VM - left message to contact office.

## 2021-04-21 ENCOUNTER — Telehealth: Payer: Self-pay | Admitting: Cardiology

## 2021-04-21 ENCOUNTER — Telehealth: Payer: Self-pay

## 2021-04-21 ENCOUNTER — Encounter: Payer: Self-pay | Admitting: Cardiology

## 2021-04-21 ENCOUNTER — Other Ambulatory Visit: Payer: Self-pay

## 2021-04-21 ENCOUNTER — Ambulatory Visit (INDEPENDENT_AMBULATORY_CARE_PROVIDER_SITE_OTHER): Payer: Medicare Other | Admitting: Cardiology

## 2021-04-21 ENCOUNTER — Inpatient Hospital Stay: Payer: Medicare Other

## 2021-04-21 VITALS — BP 134/85 | HR 108 | Temp 97.9°F | Resp 20

## 2021-04-21 VITALS — BP 110/60 | HR 118 | Ht 76.0 in | Wt 310.0 lb

## 2021-04-21 DIAGNOSIS — Z5111 Encounter for antineoplastic chemotherapy: Secondary | ICD-10-CM | POA: Diagnosis not present

## 2021-04-21 DIAGNOSIS — Z7189 Other specified counseling: Secondary | ICD-10-CM

## 2021-04-21 DIAGNOSIS — C8338 Diffuse large B-cell lymphoma, lymph nodes of multiple sites: Secondary | ICD-10-CM | POA: Diagnosis not present

## 2021-04-21 DIAGNOSIS — I480 Paroxysmal atrial fibrillation: Secondary | ICD-10-CM | POA: Diagnosis not present

## 2021-04-21 DIAGNOSIS — D638 Anemia in other chronic diseases classified elsewhere: Secondary | ICD-10-CM | POA: Diagnosis not present

## 2021-04-21 DIAGNOSIS — Z5189 Encounter for other specified aftercare: Secondary | ICD-10-CM | POA: Diagnosis not present

## 2021-04-21 DIAGNOSIS — C7801 Secondary malignant neoplasm of right lung: Secondary | ICD-10-CM | POA: Diagnosis not present

## 2021-04-21 DIAGNOSIS — Z5112 Encounter for antineoplastic immunotherapy: Secondary | ICD-10-CM | POA: Diagnosis not present

## 2021-04-21 MED ORDER — PEGFILGRASTIM-JMDB 6 MG/0.6ML ~~LOC~~ SOSY
PREFILLED_SYRINGE | SUBCUTANEOUS | Status: AC
  Filled 2021-04-21: qty 0.6

## 2021-04-21 MED ORDER — AMIODARONE HCL 200 MG PO TABS: 200.0000 mg | ORAL_TABLET | Freq: Two times a day (BID) | ORAL | 3 refills | Status: DC

## 2021-04-21 MED ORDER — PEGFILGRASTIM-JMDB 6 MG/0.6ML ~~LOC~~ SOSY
6.0000 mg | PREFILLED_SYRINGE | Freq: Once | SUBCUTANEOUS | Status: AC
  Administered 2021-04-21: 6 mg via SUBCUTANEOUS

## 2021-04-21 NOTE — Telephone Encounter (Signed)
Pt states his HR has been running in the 130s since he got up this morning.  Thinks he may be in AF.  Laid down and HR dropped to the 90s but went back to 130s as soon as he got back up.  Feels weak, SOB and has no energy.  O2 SATs are 94-95%.  Denies any missed doses of Amiodarone or Xarelto.  Spoke with Dr. Marlou Porch and he said to have pt come over to see him and get set up for a cardioversion.  Spoke with pt and he states he can be here within the next hour.  Pt appreciative for help.

## 2021-04-21 NOTE — H&P (View-Only) (Signed)
Cardiology Office Note:    Date:  04/21/2021   ID:  Derek, [name] 1941-08-23, MRN [id number]  PCP:  Wenda Low, MD   [facility] Providers Cardiologist:  [name], MD     Referring MD: Wenda Low, MD     History of Present Illness:    Derek [name] is a 80 y.o. male with diabetes hypertension coronary disease CLL atrial flutter and fibrillation seen previously at the atrial fibrillation clinic with prior atrial flutter diagnosed in 2019 on amiodarone here for rapid heart rate, atrial flutter with rapid ventricular response.  ECG personally reviewed shows atrial flutter with heart rate of 118 bpm.  His last cardioversion was on 11/30/2020.  He is currently symptomatic with his flutter. Short of breath with minimal to no activity. Chest film yesterday with basilar changes - being treated, CLL. Recent chemo.    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   GERD (gastroesophageal reflux disease)    occ tums   H/O cardiac catheterization    H/O exercise stress test 1999, 2002   Hypertension    Hypertriglyceridemia    Lymphocytosis    Obesity    Pulmonary Function Test 09/2018   PFTs 09/2018:  FEV1 88% predicted; FEV1/FVC 78%; DLCO cor 58    Past Surgical History:  Procedure Laterality Date   CARDIOVERSION N/A 09/05/2018   Procedure: CARDIOVERSION;  Surgeon: Jerline Pain, MD;  Location: [place];  Service: Cardiovascular;  Laterality: N/A;   CARDIOVERSION N/A 10/22/2018   Procedure: CARDIOVERSION;  Surgeon:  Jerline Pain, MD;  Location: [place];  Service: Cardiovascular;  Laterality: N/A;   CARDIOVERSION N/A 08/28/2020   Procedure: CARDIOVERSION;  Surgeon: Josue [name], MD;  Location: [place];  Service: Cardiovascular;  Laterality: N/A;   CARDIOVERSION N/A 11/30/2020   Procedure: CARDIOVERSION;  Surgeon: Jerline Pain, MD;  Location: [facility] ENDOSCOPY;  Service: Cardiovascular;  Laterality: N/A;   CHOLECYSTECTOMY N/A 07/12/2016   Procedure: LAPAROSCOPIC CHOLECYSTECTOMY;  Surgeon: Donnie Mesa, MD;  Location: [place];  Service: General;  Laterality: N/A;   EXTRACORPOREAL SHOCK WAVE LITHOTRIPSY Right 11/18/2019   Procedure: EXTRACORPOREAL SHOCK WAVE LITHOTRIPSY (ESWL);  Surgeon: Lucas Mallow, MD;  Location[facility];  Service: Urology;  Laterality: Right;   FOOT SURGERY Right    IR IMAGING GUIDED PORT INSERTION  03/19/2021   NASAL SINUS SURGERY     TEE WITHOUT CARDIOVERSION N/A 09/05/2018   Procedure: TRANSESOPHAGEAL ECHOCARDIOGRAM (TEE);  Surgeon: Jerline Pain, MD;  Location: [facility] ENDOSCOPY;  Service: Cardiovascular;  Laterality: N/A;   TEE WITHOUT CARDIOVERSION N/A 08/28/2020   Procedure: TRANSESOPHAGEAL ECHOCARDIOGRAM (TEE);  Surgeon: Josue [name], MD;  Location: [facility] ENDOSCOPY;  Service: Cardiovascular;  Laterality: N/A;   TONSILLECTOMY     UMBILICAL HERNIA REPAIR N/A 07/12/2016   Procedure: UMBILICAL HERINA REPAIR;  Surgeon: Donnie Mesa, MD;  Location: [place];  Service: General;  Laterality: N/A;   vericose vein stripping      Current Medications: Current Meds  Medication Sig   acyclovir (ZOVIRAX) 400 MG tablet Take 1  tablet (400 mg total) by mouth daily.   allopurinol (ZYLOPRIM) 300 MG tablet TAKE 1 TABLET BY MOUTH EVERY DAY   doxepin (SINEQUAN) 10 MG capsule Take 10 mg by mouth at bedtime.   glimepiride (AMARYL) 2 MG tablet Take 2 mg by mouth daily with breakfast.   glucose blood test strip Dispense glucometer, strips and lancets preferred by patient's  insurance (DX: E11.9 / type 2 DM)   levothyroxine (SYNTHROID) 25 MCG tablet Take 25 mcg by mouth daily before breakfast.   lidocaine-prilocaine (EMLA) cream Apply to affected area once   Needle, Disp, (HYPODERMIC NEEDLE 26GX5/8") 26G X 5/8" MISC See admin instructions.   Omega-3 Fatty Acids (FISH OIL PO) Take 2 capsules by mouth daily.   ondansetron (ZOFRAN) 8 MG tablet Take 8 mg by mouth every 8 (eight) hours as needed.   predniSONE (DELTASONE) 20 MG tablet Take 2 tablets (40 mg total) by mouth daily. Take with food on for 4 days after chemotherapy every 3 weeks   prochlorperazine (COMPAZINE) 10 MG tablet Take 1 tablet (10 mg total) by mouth every 6 (six) hours as needed for nausea or vomiting.   rivaroxaban (XARELTO) 20 MG TABS tablet Take 1 tablet (20 mg total) by mouth daily with supper.   rosuvastatin (CRESTOR) 10 MG tablet Take 10 mg by mouth daily.   tamsulosin (FLOMAX) 0.4 MG CAPS capsule Take 0.4 mg by mouth daily.   [DISCONTINUED] amiodarone (PACERONE) 200 MG tablet Take 1 tablet (200 mg total) by mouth daily.     Allergies:   Lipitor [atorvastatin] and Metformin   Social History   Socioeconomic History   Marital status: Widowed    Spouse name: Not on file   Number of children: 3   Years of education: Not on file   Highest education level: Not on file  Occupational History    Employer: [employer]    Comment: retied Audiological scientist; asphalt work   Tobacco Use   Smoking status: Former    Packs/day: 3.00    Years: 10.00    Pack years: 30.00    Types: Cigarettes    Quit date: 09/19/1988    Years since quitting: 32.6   Smokeless tobacco: Never  Vaping Use   Vaping Use: Never used  Substance and Sexual Activity   Alcohol use: Yes    Alcohol/week: 17.0 - 19.0 standard drinks    Types: 14 Shots of liquor, 3 - 5 Standard drinks or equivalent per week   Drug use: No   Sexual activity: Not on file  Other Topics Concern   Not on file  Social History Narrative   Not on file   Social  Determinants of Health   Financial Resource Strain: Not on file  Food Insecurity: Not on file  Transportation Needs: Not on file  Physical Activity: Not on file  Stress: Not on file  Social Connections: Not on file     Family History: The patient's family history includes Cancer in his father; Heart attack in his mother.  ROS:   Please see the history of present illness.    No fevers, chills, bleeding. No chest pain.  All other systems reviewed and are negative.  EKGs/Labs/Other Studies Reviewed:    The following studies were reviewed today: ECHO 03/17/21:   1. Left ventricular ejection fraction, by estimation, is 55 to 60%. The  left ventricle has normal function. The left ventricle has no regional  wall motion abnormalities. There is moderate concentric left ventricular  hypertrophy. Left  ventricular  diastolic parameters are indeterminate. The average left ventricular  global longitudinal strain is -20.4 %. The global longitudinal strain is  normal.   2. Right ventricular systolic function is normal. The right ventricular  size is normal. Tricuspid regurgitation signal is inadequate for assessing  PA pressure.   3. The mitral valve is grossly normal. Mild mitral valve regurgitation.  No evidence of mitral stenosis.   4. The aortic valve is tricuspid. There is mild calcification of the  aortic valve. Aortic valve regurgitation is not visualized. Mild aortic  valve sclerosis is present, with no evidence of aortic valve stenosis.   5. The inferior vena cava is normal in size with greater than 50%  respiratory variability, suggesting right atrial pressure of 3 mmHg.   EKG:  EKG is  ordered today.  The ekg ordered today demonstrates AFLUTTER 118  Recent Labs: 11/12/2020: TSH 5.203 04/19/2021: ALT 13; BUN 17; Creatinine 1.94; Hemoglobin 12.1; Platelet Count 178; Potassium 4.9; Sodium 138  Recent Lipid Panel No results found for: CHOL, TRIG, HDL, CHOLHDL, VLDL, LDLCALC,  LDLDIRECT    Physical Exam:    VS:  BP 110/60 (BP Location: Left Arm, Patient Position: Sitting, Cuff Size: Normal)   Pulse (!) 118   Ht 6\' 4"  (1.93 m)   Wt (!) 310 lb (140.6 kg)   SpO2 97%   BMI 37.73 kg/m     Wt Readings from Last 3 Encounters:  04/21/21 (!) 310 lb (140.6 kg)  04/19/21 (!) 309 lb (140.2 kg)  03/29/21 (!) 316 lb 8 oz (143.6 kg)     GEN:  Well nourished, well developed in no acute distress HEENT: Normal NECK: No JVD; No carotid bruits LYMPHATICS: No lymphadenopathy CARDIAC: tachy irreg, no murmurs, rubs, gallops RESPIRATORY:  Clear to auscultation without rales, wheezing or rhonchi  ABDOMEN: Soft, non-tender, non-distended MUSCULOSKELETAL:  No edema; No deformity  SKIN: Warm and dry NEUROLOGIC:  Alert and oriented x 3 PSYCHIATRIC:  Normal affect   ASSESSMENT:    1. Paroxysmal atrial fibrillation (HCC)    PLAN:    In order of problems listed above:  Persistent atrial flutter - Currently on amiodarone 200 mg a day.  I will increase this to 200 mg twice a day and set him up for cardioversion.  He has not missed any doses.  He may proceed without TEE.  We have a space for him at 8:00 in the morning tomorrow with Dr. Harrell Gave.  Risks and benefits have been explained including need for pacemaker.  He is willing to proceed. If symptoms worsen, SOB, can report to ER at hospital.   Chronic anticoagulation - Continue with Xarelto, CHADSVASc 5  Obesity - BMI 39, continue lifestyle weight reduction  Coronary artery disease - Prior cardiac catheterization showed a 85 and 95% in a diagonal branch and occluded second marginal.  Medical management ensued.  Prior drug rash from Lipitor seen by dermatology.  CLL - Hematology notes reviewed.  Stable. Recent chemo.   Shared Decision Making/Informed Consent The risks (stroke, cardiac arrhythmias rarely resulting in the need for a temporary or permanent pacemaker, skin irritation or burns and complications  associated with conscious sedation including aspiration, arrhythmia, respiratory failure and death), benefits (restoration of normal sinus rhythm) and alternatives of a direct current cardioversion were explained in detail to Derek [name] and he agrees to proceed.      Medication Adjustments/Labs and Tests Ordered: Current medicines are reviewed at length with the patient today.  Concerns regarding medicines  are outlined above.  Orders Placed This Encounter  Procedures   EKG 12-Lead   Meds ordered this encounter  Medications   amiodarone (PACERONE) 200 MG tablet    Sig: Take 1 tablet (200 mg total) by mouth 2 (two) times daily.    Dispense:  60 tablet    Refill:  3    Patient Instructions  Medication Instructions:  Increase your Amiodarone to 200 mg twice a day. Continue all other medications as listed.  *If you need a refill on your cardiac medications before your next appointment, please call your pharmacy*  Lab Work: None - had 8/1  Testing/Procedures: Your physician has requested that you have a Cardioversion. Electrical Cardioversion uses a jolt of electricity to your heart either through paddles or wired patches attached to your chest. This is a controlled, usually prescheduled, procedure. This procedure is done at the hospital and you are not awake during the procedure. You usually go home the day of the procedure. Please see the instruction sheet given to you today for more information.  Follow-Up: At [facility], you and your health needs are our priority.  As part of our continuing mission to provide you with exceptional heart care, we have created designated Provider Care Teams.  These Care Teams include your primary Cardiologist (physician) and Advanced Practice Providers (APPs -  Physician Assistants and Nurse Practitioners) who all work together to provide you with the care you need, when you need it.  We recommend signing up for the patient portal called "MyChart".   Sign up information is provided on this After Visit Summary.  MyChart is used to connect with patients for Virtual Visits (Telemedicine).  Patients are able to view lab/test results, encounter notes, upcoming appointments, etc.  Non-urgent messages can be sent to your provider as well.   To learn more about what you can do with MyChart, go to [email or web address].    Your next appointment:   Follow up as scheduled.    [facility] OFFICE [place], [address] [address] Dept: [phone] Loc: [phone]  Derek [name]  04/21/2021  You are scheduled for an outpatient cardioversion tomorrow, Thursday April 22, 2021 with Dr Harrell Gave.  1. Please arrive at the [facility] (Main Entrance A) at [facility]: [address] [place],  [address] at  [place] parking service is available.   2. Diet: Do not eat or drink anything after midnight prior to your procedure except sips of water to take medications.  3. Labs: None needed. (Done 8/1)  4. Medication instructions in preparation for your procedure:  On the morning of your procedure, take your normal medications with sips of water.  Do not take your oral medications for Diabetes.  Make sure not to miss any doses of your Xarelto.  5. Bring a current list of your medications and current insurance cards. 6. You MUST have a responsible person to drive you home. 7. Someone MUST be with you the first 24 hours after you arrive home or your discharge will be delayed. 8. Please wear clothes that are easy to get on and off and wear slip-on shoes.  Thank you for allowing [place] to care for you!   -- [place] [facility]    Thank you for choosing [facility]!!      Signed, [name], MD  04/21/2021 12:51 PM    [facility]

## 2021-04-21 NOTE — Telephone Encounter (Signed)
Pt c/o medication issue:  1. Name of Medication: Metoprolol 25 MG  2. How are you currently taking this medication (dosage and times per day)? 1/2 a tablet daily   3. Are you having a reaction (difficulty breathing--STAT)? No   4. What is your medication issue? Wants to know if pt is to take this medication prior to his procedure tomorrow due to it not being on his medication list.

## 2021-04-21 NOTE — Telephone Encounter (Signed)
Patient c/o Palpitations:  High priority if patient c/o lightheadedness, shortness of breath, or chest pain  How long have you had palpitations/irregular HR/ Afib? Are you having the symptoms now? Thinks he is in Afib now heart rate 130  Are you currently experiencing lightheadedness, SOB or CP?  Short of breath  Do you have a history of afib (atrial fibrillation) or irregular heart rhythm? History of Afib  Have you checked your BP or HR? (document readings if available):   Are you experiencing any other symptoms? no

## 2021-04-21 NOTE — Progress Notes (Signed)
Cardiology Office Note:    Date:  04/21/2021   ID:  Derek [name], Derek [name] May 09, 1941, MRN [id number]  PCP:  Wenda Low, MD   [facility] Providers Cardiologist:  [name], MD     Referring MD: Wenda Low, MD     History of Present Illness:    Derek [name] is a 80 y.o. male with diabetes hypertension coronary disease CLL atrial flutter and fibrillation seen previously at the atrial fibrillation clinic with prior atrial flutter diagnosed in 2019 on amiodarone here for rapid heart rate, atrial flutter with rapid ventricular response.  ECG personally reviewed shows atrial flutter with heart rate of 118 bpm.  His last cardioversion was on 11/30/2020.  He is currently symptomatic with his flutter. Short of breath with minimal to no activity. Chest film yesterday with basilar changes - being treated, CLL. Recent chemo.    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   GERD (gastroesophageal reflux disease)    occ tums   H/O cardiac catheterization    H/O exercise stress test 1999, 2002   Hypertension    Hypertriglyceridemia    Lymphocytosis    Obesity    Pulmonary Function Test 09/2018   PFTs 09/2018:  FEV1 88% predicted; FEV1/FVC 78%; DLCO cor 58    Past Surgical History:  Procedure Laterality Date   CARDIOVERSION N/A 09/05/2018   Procedure: CARDIOVERSION;  Surgeon: Jerline Pain, MD;  Location: [place];  Service: Cardiovascular;  Laterality: N/A;   CARDIOVERSION N/A 10/22/2018   Procedure: CARDIOVERSION;  Surgeon:  Jerline Pain, MD;  Location: [place];  Service: Cardiovascular;  Laterality: N/A;   CARDIOVERSION N/A 08/28/2020   Procedure: CARDIOVERSION;  Surgeon: Josue [name], MD;  Location: [place];  Service: Cardiovascular;  Laterality: N/A;   CARDIOVERSION N/A 11/30/2020   Procedure: CARDIOVERSION;  Surgeon: Jerline Pain, MD;  Location: [facility] ENDOSCOPY;  Service: Cardiovascular;  Laterality: N/A;   CHOLECYSTECTOMY N/A 07/12/2016   Procedure: LAPAROSCOPIC CHOLECYSTECTOMY;  Surgeon: Donnie Mesa, MD;  Location: [place];  Service: General;  Laterality: N/A;   EXTRACORPOREAL SHOCK WAVE LITHOTRIPSY Right 11/18/2019   Procedure: EXTRACORPOREAL SHOCK WAVE LITHOTRIPSY (ESWL);  Surgeon: Lucas Mallow, MD;  Location: [facility];  Service: Urology;  Laterality: Right;   FOOT SURGERY Right    IR IMAGING GUIDED PORT INSERTION  03/19/2021   NASAL SINUS SURGERY     TEE WITHOUT CARDIOVERSION N/A 09/05/2018   Procedure: TRANSESOPHAGEAL ECHOCARDIOGRAM (TEE);  Surgeon: Jerline Pain, MD;  Location: [facility];  Service: Cardiovascular;  Laterality: N/A;   TEE WITHOUT CARDIOVERSION N/A 08/28/2020   Procedure: TRANSESOPHAGEAL ECHOCARDIOGRAM (TEE);  Surgeon: Josue [name], MD;  Location: [facility] ENDOSCOPY;  Service: Cardiovascular;  Laterality: N/A;   TONSILLECTOMY     UMBILICAL HERNIA REPAIR N/A 07/12/2016   Procedure: UMBILICAL HERINA REPAIR;  Surgeon: Donnie Mesa, MD;  Location: [address];  Service: General;  Laterality: N/A;   vericose vein stripping      Current Medications: Current Meds  Medication Sig   acyclovir (ZOVIRAX) 400 MG tablet Take 1  tablet (400 mg total) by mouth daily.   allopurinol (ZYLOPRIM) 300 MG tablet TAKE 1 TABLET BY MOUTH EVERY DAY   doxepin (SINEQUAN) 10 MG capsule Take 10 mg by mouth at bedtime.   glimepiride (AMARYL) 2 MG tablet Take 2 mg by mouth daily with breakfast.   glucose blood test strip Dispense glucometer, strips and lancets preferred by patient's  insurance (DX: E11.9 / type 2 DM)   levothyroxine (SYNTHROID) 25 MCG tablet Take 25 mcg by mouth daily before breakfast.   lidocaine-prilocaine (EMLA) cream Apply to affected area once   Needle, Disp, (HYPODERMIC NEEDLE 26GX5/8") 26G X 5/8" MISC See admin instructions.   Omega-3 Fatty Acids (FISH OIL PO) Take 2 capsules by mouth daily.   ondansetron (ZOFRAN) 8 MG tablet Take 8 mg by mouth every 8 (eight) hours as needed.   predniSONE (DELTASONE) 20 MG tablet Take 2 tablets (40 mg total) by mouth daily. Take with food on for 4 days after chemotherapy every 3 weeks   prochlorperazine (COMPAZINE) 10 MG tablet Take 1 tablet (10 mg total) by mouth every 6 (six) hours as needed for nausea or vomiting.   rivaroxaban (XARELTO) 20 MG TABS tablet Take 1 tablet (20 mg total) by mouth daily with supper.   rosuvastatin (CRESTOR) 10 MG tablet Take 10 mg by mouth daily.   tamsulosin (FLOMAX) 0.4 MG CAPS capsule Take 0.4 mg by mouth daily.   [DISCONTINUED] amiodarone (PACERONE) 200 MG tablet Take 1 tablet (200 mg total) by mouth daily.     Allergies:   Lipitor [atorvastatin] and Metformin   Social History   Socioeconomic History   Marital status: Widowed    Spouse name: Not on file   Number of children: 3   Years of education: Not on file   Highest education level: Not on file  Occupational History    Employer: [employer]    Comment: retied Audiological scientist; asphalt work   Tobacco Use   Smoking status: Former    Packs/day: 3.00    Years: 10.00    Pack years: 30.00    Types: Cigarettes    Quit date: 09/19/1988    Years since quitting: 32.6   Smokeless tobacco: Never  Vaping Use   Vaping Use: Never used  Substance and Sexual Activity   Alcohol use: Yes    Alcohol/week: 17.0 - 19.0 standard drinks    Types: 14 Shots of liquor, 3 - 5 Standard drinks or equivalent per week   Drug use: No   Sexual activity: Not on file  Other Topics Concern   Not on file  Social History Narrative   Not on file   Social  Determinants of Health   Financial Resource Strain: Not on file  Food Insecurity: Not on file  Transportation Needs: Not on file  Physical Activity: Not on file  Stress: Not on file  Social Connections: Not on file     Family History: The patient's family history includes Cancer in his father; Heart attack in his mother.  ROS:   Please see the history of present illness.    No fevers, chills, bleeding. No chest pain.  All other systems reviewed and are negative.  EKGs/Labs/Other Studies Reviewed:    The following studies were reviewed today: ECHO 03/17/21:   1. Left ventricular ejection fraction, by estimation, is 55 to 60%. The  left ventricle has normal function. The left ventricle has no regional  wall motion abnormalities. There is moderate concentric left ventricular  hypertrophy. Left  ventricular  diastolic parameters are indeterminate. The average left ventricular  global longitudinal strain is -20.4 %. The global longitudinal strain is  normal.   2. Right ventricular systolic function is normal. The right ventricular  size is normal. Tricuspid regurgitation signal is inadequate for assessing  PA pressure.   3. The mitral valve is grossly normal. Mild mitral valve regurgitation.  No evidence of mitral stenosis.   4. The aortic valve is tricuspid. There is mild calcification of the  aortic valve. Aortic valve regurgitation is not visualized. Mild aortic  valve sclerosis is present, with no evidence of aortic valve stenosis.   5. The inferior vena cava is normal in size with greater than 50%  respiratory variability, suggesting right atrial pressure of 3 mmHg.   EKG:  EKG is  ordered today.  The ekg ordered today demonstrates AFLUTTER 118  Recent Labs: 11/12/2020: TSH 5.203 04/19/2021: ALT 13; BUN 17; Creatinine 1.94; Hemoglobin 12.1; Platelet Count 178; Potassium 4.9; Sodium 138  Recent Lipid Panel No results found for: CHOL, TRIG, HDL, CHOLHDL, VLDL, LDLCALC,  LDLDIRECT    Physical Exam:    VS:  BP 110/60 (BP Location: Left Arm, Patient Position: Sitting, Cuff Size: Normal)   Pulse (!) 118   Ht 6\' 4"  (1.93 m)   Wt (!) 310 lb (140.6 kg)   SpO2 97%   BMI 37.73 kg/m     Wt Readings from Last 3 Encounters:  04/21/21 (!) 310 lb (140.6 kg)  04/19/21 (!) 309 lb (140.2 kg)  03/29/21 (!) 316 lb 8 oz (143.6 kg)     GEN:  Well nourished, well developed in no acute distress HEENT: Normal NECK: No JVD; No carotid bruits LYMPHATICS: No lymphadenopathy CARDIAC: tachy irreg, no murmurs, rubs, gallops RESPIRATORY:  Clear to auscultation without rales, wheezing or rhonchi  ABDOMEN: Soft, non-tender, non-distended MUSCULOSKELETAL:  No edema; No deformity  SKIN: Warm and dry NEUROLOGIC:  Alert and oriented x 3 PSYCHIATRIC:  Normal affect   ASSESSMENT:    1. Paroxysmal atrial fibrillation (HCC)    PLAN:    In order of problems listed above:  Persistent atrial flutter - Currently on amiodarone 200 mg a day.  I will increase this to 200 mg twice a day and set him up for cardioversion.  He has not missed any doses.  He may proceed without TEE.  We have a space for him at 8:00 in the morning tomorrow with Dr. Harrell Gave.  Risks and benefits have been explained including need for pacemaker.  He is willing to proceed. If symptoms worsen, SOB, can report to ER at hospital.   Chronic anticoagulation - Continue with Xarelto, CHADSVASc 5  Obesity - BMI 39, continue lifestyle weight reduction  Coronary artery disease - Prior cardiac catheterization showed a 85 and 95% in a diagonal branch and occluded second marginal.  Medical management ensued.  Prior drug rash from Lipitor seen by dermatology.  CLL - Hematology notes reviewed.  Stable. Recent chemo.   Shared Decision Making/Informed Consent The risks (stroke, cardiac arrhythmias rarely resulting in the need for a temporary or permanent pacemaker, skin irritation or burns and complications  associated with conscious sedation including aspiration, arrhythmia, respiratory failure and death), benefits (restoration of normal sinus rhythm) and alternatives of a direct current cardioversion were explained in detail to Derek [name] and he agrees to proceed.      Medication Adjustments/Labs and Tests Ordered: Current medicines are reviewed at length with the patient today.  Concerns regarding medicines  are outlined above.  Orders Placed This Encounter  Procedures   EKG 12-Lead   Meds ordered this encounter  Medications   amiodarone (PACERONE) 200 MG tablet    Sig: Take 1 tablet (200 mg total) by mouth 2 (two) times daily.    Dispense:  60 tablet    Refill:  3    Patient Instructions  Medication Instructions:  Increase your Amiodarone to 200 mg twice a day. Continue all other medications as listed.  *If you need a refill on your cardiac medications before your next appointment, please call your pharmacy*  Lab Work: None - had 8/1  Testing/Procedures: Your physician has requested that you have a Cardioversion. Electrical Cardioversion uses a jolt of electricity to your heart either through paddles or wired patches attached to your chest. This is a controlled, usually prescheduled, procedure. This procedure is done at the hospital and you are not awake during the procedure. You usually go home the day of the procedure. Please see the instruction sheet given to you today for more information.  Follow-Up: At [facility], you and your health needs are our priority.  As part of our continuing mission to provide you with exceptional heart care, we have created designated Provider Care Teams.  These Care Teams include your primary Cardiologist (physician) and Advanced Practice Providers (APPs -  Physician Assistants and Nurse Practitioners) who all work together to provide you with the care you need, when you need it.  We recommend signing up for the patient portal called "MyChart".   Sign up information is provided on this After Visit Summary.  MyChart is used to connect with patients for Virtual Visits (Telemedicine).  Patients are able to view lab/test results, encounter notes, upcoming appointments, etc.  Non-urgent messages can be sent to your provider as well.   To learn more about what you can do with MyChart, go to [email or web address].    Your next appointment:   Follow up as scheduled.    [place] OFFICE [place], [place] [address] Dept: [phone] Loc: [phone]  Derek [name]  04/21/2021  You are scheduled for an outpatient cardioversion tomorrow, Thursday April 22, 2021 with Dr Harrell Gave.  1. Please arrive at the [facility] (Main Entrance A) at [facility]: [address] [place], [place] [address] at  [place] parking service is available.   2. Diet: Do not eat or drink anything after midnight prior to your procedure except sips of water to take medications.  3. Labs: None needed. (Done 8/1)  4. Medication instructions in preparation for your procedure:  On the morning of your procedure, take your normal medications with sips of water.  Do not take your oral medications for Diabetes.  Make sure not to miss any doses of your Xarelto.  5. Bring a current list of your medications and current insurance cards. 6. You MUST have a responsible person to drive you home. 7. Someone MUST be with you the first 24 hours after you arrive home or your discharge will be delayed. 8. Please wear clothes that are easy to get on and off and wear slip-on shoes.  Thank you for allowing [place] to care for you!   -- [place] [facility]    Thank you for choosing [facility]!!      Signed, [name], MD  04/21/2021 12:51 PM    [facility]

## 2021-04-21 NOTE — Patient Instructions (Addendum)
Medication Instructions:  Increase your Amiodarone to 200 mg twice a day. Continue all other medications as listed.  *If you need a refill on your cardiac medications before your next appointment, please call your pharmacy*  Lab Work: None - had 8/1  Testing/Procedures: Your physician has requested that you have a Cardioversion. Electrical Cardioversion uses a jolt of electricity to your heart either through paddles or wired patches attached to your chest. This is a controlled, usually prescheduled, procedure. This procedure is done at the hospital and you are not awake during the procedure. You usually go home the day of the procedure. Please see the instruction sheet given to you today for more information.  Follow-Up: At [facility], you and your health needs are our priority.  As part of our continuing mission to provide you with exceptional heart care, we have created designated Provider Care Teams.  These Care Teams include your primary Cardiologist (physician) and Advanced Practice Providers (APPs -  Physician Assistants and Nurse Practitioners) who all work together to provide you with the care you need, when you need it.  We recommend signing up for the patient portal called "MyChart".  Sign up information is provided on this After Visit Summary.  MyChart is used to connect with patients for Virtual Visits (Telemedicine).  Patients are able to view lab/test results, encounter notes, upcoming appointments, etc.  Non-urgent messages can be sent to your provider as well.   To learn more about what you can do with MyChart, go to [email or web address].    Your next appointment:   Follow up as scheduled.    [place] OFFICE [place], [place] [address] Dept: [phone] Loc: [phone]  Derek [name]  04/21/2021  You are scheduled for an outpatient cardioversion tomorrow, Thursday  April 22, 2021 with Dr Harrell Gave.  1. Please arrive at the [facility] (Main Entrance A) at [facility]: [address] [place], [place] [address] at  [place] parking service is available.   2. Diet: Do not eat or drink anything after midnight prior to your procedure except sips of water to take medications.  3. Labs: None needed. (Done 8/1)  4. Medication instructions in preparation for your procedure:  On the morning of your procedure, take your normal medications with sips of water.  Do not take your oral medications for Diabetes.  Make sure not to miss any doses of your Xarelto.  5. Bring a current list of your medications and current insurance cards. 6. You MUST have a responsible person to drive you home. 7. Someone MUST be with you the first 24 hours after you arrive home or your discharge will be delayed. 8. Please wear clothes that are easy to get on and off and wear slip-on shoes.  Thank you for allowing [place] to care for you!   -- [place] [facility]    Thank you for choosing [facility]!!

## 2021-04-21 NOTE — Patient Instructions (Signed)

## 2021-04-21 NOTE — Telephone Encounter (Signed)
Called and told CXR results are good. His cough is due to his lungs improving. He verbalized understanding.  He has a pulse ox at home and checks it everyday. Oxygen saturation is 95 to 96%. Heart rate is 133 now and has been as high at 149 today. Denies exercise. If he rests heart rate goes down to the 60's. He just took all am medications and will call cardiology if heart rate continues to be elevated.

## 2021-04-21 NOTE — Telephone Encounter (Signed)
Reviewed chart and verified pt was instructed to d/c metoprolol 01/08/21 d/t bradycardia.  Spoke with pt and advised of the above information.  He stated understanding and had no further questions.

## 2021-04-22 ENCOUNTER — Ambulatory Visit (HOSPITAL_COMMUNITY): Payer: Medicare Other | Admitting: Anesthesiology

## 2021-04-22 ENCOUNTER — Other Ambulatory Visit: Payer: Self-pay

## 2021-04-22 ENCOUNTER — Encounter (HOSPITAL_COMMUNITY): Payer: Self-pay | Admitting: Cardiology

## 2021-04-22 ENCOUNTER — Ambulatory Visit (HOSPITAL_COMMUNITY)
Admission: RE | Admit: 2021-04-22 | Discharge: 2021-04-22 | Disposition: A | Discharge status: Home / Self Care | Payer: Medicare Other | Attending: Cardiology | Admitting: Cardiology

## 2021-04-22 ENCOUNTER — Encounter (HOSPITAL_COMMUNITY)
Admission: RE | Disposition: A | Discharge status: Home / Self Care | Payer: Self-pay | Source: Home / Self Care | Attending: Cardiology

## 2021-04-22 DIAGNOSIS — Z79899 Other long term (current) drug therapy: Secondary | ICD-10-CM | POA: Diagnosis not present

## 2021-04-22 DIAGNOSIS — I480 Paroxysmal atrial fibrillation: Secondary | ICD-10-CM | POA: Diagnosis not present

## 2021-04-22 DIAGNOSIS — K219 Gastro-esophageal reflux disease without esophagitis: Secondary | ICD-10-CM | POA: Diagnosis not present

## 2021-04-22 DIAGNOSIS — Z888 Allergy status to other drugs, medicaments and biological substances status: Secondary | ICD-10-CM | POA: Insufficient documentation

## 2021-04-22 DIAGNOSIS — E119 Type 2 diabetes mellitus without complications: Secondary | ICD-10-CM | POA: Insufficient documentation

## 2021-04-22 DIAGNOSIS — I4892 Unspecified atrial flutter: Secondary | ICD-10-CM

## 2021-04-22 DIAGNOSIS — Z7901 Long term (current) use of anticoagulants: Secondary | ICD-10-CM | POA: Insufficient documentation

## 2021-04-22 DIAGNOSIS — Z87891 Personal history of nicotine dependence: Secondary | ICD-10-CM | POA: Diagnosis not present

## 2021-04-22 DIAGNOSIS — C9110 Chronic lymphocytic leukemia of B-cell type not having achieved remission: Secondary | ICD-10-CM | POA: Diagnosis not present

## 2021-04-22 DIAGNOSIS — C4441 Basal cell carcinoma of skin of scalp and neck: Secondary | ICD-10-CM | POA: Diagnosis not present

## 2021-04-22 DIAGNOSIS — I340 Nonrheumatic mitral (valve) insufficiency: Secondary | ICD-10-CM | POA: Insufficient documentation

## 2021-04-22 DIAGNOSIS — E785 Hyperlipidemia, unspecified: Secondary | ICD-10-CM | POA: Diagnosis not present

## 2021-04-22 HISTORY — PX: CARDIOVERSION: SHX1299

## 2021-04-22 LAB — GLUCOSE, CAPILLARY: Glucose-Capillary: 81 mg/dL (ref 70–99)

## 2021-04-22 SURGERY — CARDIOVERSION
Anesthesia: General

## 2021-04-22 MED ORDER — PROPOFOL 10 MG/ML IV BOLUS
INTRAVENOUS | Status: DC | PRN
  Administered 2021-04-22: 70 mg via INTRAVENOUS

## 2021-04-22 MED ORDER — LIDOCAINE 2% (20 MG/ML) 5 ML SYRINGE
INTRAMUSCULAR | Status: DC | PRN
  Administered 2021-04-22: 100 mg via INTRAVENOUS

## 2021-04-22 MED ORDER — SODIUM CHLORIDE 0.9 % IV SOLN: INTRAVENOUS | Status: DC | PRN

## 2021-04-22 NOTE — Anesthesia Procedure Notes (Signed)
Procedure Name: General with mask airway Date/Time: 04/22/2021 8:09 AM Performed by: Kathryne Hitch, CRNA Pre-anesthesia Checklist: Emergency Drugs available, Patient identified, Suction available and Patient being monitored Patient Re-evaluated:Patient Re-evaluated prior to induction Oxygen Delivery Method: Simple face mask Preoxygenation: Pre-oxygenation with 100% oxygen Induction Type: IV induction Dental Injury: Teeth and Oropharynx as per pre-operative assessment

## 2021-04-22 NOTE — Anesthesia Postprocedure Evaluation (Signed)
Anesthesia Post Note  Patient: Derek [name]  Procedure(s) Performed: CARDIOVERSION     Patient location during evaluation: Endoscopy Anesthesia Type: General Level of consciousness: awake and alert Pain management: pain level controlled Vital Signs Assessment: post-procedure vital signs reviewed and stable Respiratory status: spontaneous breathing, nonlabored ventilation, respiratory function stable and patient connected to nasal cannula oxygen Cardiovascular status: blood pressure returned to baseline and stable Postop Assessment: no apparent nausea or vomiting Anesthetic complications: no   No notable events documented.  Last Vitals:  Vitals:   04/22/21 0825 04/22/21 0845  BP:  123/66  Pulse:    Resp:    Temp: 36.5 C   SpO2:      Last Pain:  Vitals:   04/22/21 0845  TempSrc:   PainSc: 0-No pain                 [place] L Sharyon Peitz

## 2021-04-22 NOTE — Transfer of Care (Signed)
Immediate Anesthesia Transfer of Care Note  Patient: Derek [name]  Procedure(s) Performed: CARDIOVERSION  Patient Location: Endoscopy Unit  Anesthesia Type:General  Level of Consciousness: drowsy and patient cooperative  Airway & Oxygen Therapy: Patient Spontanous Breathing and Patient connected to face mask oxygen  Post-op Assessment: Report given to RN and Post -op Vital signs reviewed and stable  Post vital signs: Reviewed and stable  Last Vitals:  Vitals Value Taken Time  BP 103/70   Temp    Pulse 132 04/22/21 0808  Resp 14 04/22/21 0808  SpO2 96 % 04/22/21 0808  Vitals shown include unvalidated device data.  Last Pain:  Vitals:   04/22/21 0727  TempSrc: Oral  PainSc:          Complications: No notable events documented.

## 2021-04-22 NOTE — Interval H&P Note (Signed)
History and Physical Interval Note:  04/22/2021 7:42 AM  Derek [name]  has presented today for surgery, with the diagnosis of afib.  The various methods of treatment have been discussed with the patient and family. After consideration of risks, benefits and other options for treatment, the patient has consented to  Procedure(s): CARDIOVERSION (N/A) as a surgical intervention.  The patient's history has been reviewed, patient examined, no change in status, stable for surgery.  I have reviewed the patient's chart and labs.  Questions were answered to the patient's satisfaction.     Laporcha Marchesi Harrell Gave

## 2021-04-22 NOTE — CV Procedure (Signed)
Procedure:   DCCV  Indication:  Symptomatic atrial flutter  Procedure Note:  The patient signed informed consent.  They have had had therapeutic anticoagulation with rivaroxaban greater than 3 weeks.  Anesthesia was administered by Dr. Lanetta Inch.  Patient received 100 mg IV lidocaine and 70 mg IV propofol.Adequate airway was maintained throughout and vital followed per protocol.  They were cardioverted x 1 with 150J of biphasic synchronized energy.  They converted to NSR.  There were no apparent complications.  The patient had normal neuro status and respiratory status post procedure with vitals stable as recorded elsewhere.    Follow up:  They will continue on current medical therapy and follow up with cardiology as scheduled.  Buford Dresser, MD PhD 04/22/2021 8:20 AM

## 2021-04-22 NOTE — Anesthesia Preprocedure Evaluation (Addendum)
Anesthesia Evaluation  Patient identified by MRN, date of birth, ID band Patient awake    Reviewed: Allergy & Precautions, NPO status , Patient's Chart, lab work & pertinent test results  Airway Mallampati: III  TM Distance: >3 FB Neck ROM: Full    Dental  (+) Edentulous Upper, Upper Dentures   Pulmonary neg pulmonary ROS, former smoker,    Pulmonary exam normal breath sounds clear to auscultation       Cardiovascular hypertension, + CAD  Normal cardiovascular exam+ dysrhythmias Atrial Fibrillation  Rhythm:Irregular Rate:Normal  TTE 2022 1. Left ventricular ejection fraction, by estimation, is 55 to 60%. The  left ventricle has normal function. The left ventricle has no regional  wall motion abnormalities. There is moderate concentric left ventricular  hypertrophy. Left ventricular  diastolic parameters are indeterminate. The average left ventricular  global longitudinal strain is -20.4 %. The global longitudinal strain is  normal.  2. Right ventricular systolic function is normal. The right ventricular  size is normal. Tricuspid regurgitation signal is inadequate for assessing  PA pressure.  3. The mitral valve is grossly normal. Mild mitral valve regurgitation.  No evidence of mitral stenosis.  4. The aortic valve is tricuspid. There is mild calcification of the  aortic valve. Aortic valve regurgitation is not visualized. Mild aortic  valve sclerosis is present, with no evidence of aortic valve stenosis.  5. The inferior vena cava is normal in size with greater than 50%  respiratory variability, suggesting right atrial pressure of 3 mmHg.    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)  negative genitourinary   Musculoskeletal negative musculoskeletal ROS (+)    Abdominal   Peds  Hematology negative hematology ROS (+) CLL   Anesthesia Other Findings   Reproductive/Obstetrics                            Anesthesia Physical Anesthesia Plan  ASA: 3  Anesthesia Plan: General   Post-op Pain Management:    Induction: Intravenous  PONV Risk Score and Plan: Propofol infusion and Treatment may vary due to age or medical condition  Airway Management Planned: Natural Airway  Additional Equipment:   Intra-op Plan:   Post-operative Plan:   Informed Consent: I have reviewed the patients History and Physical, chart, labs and discussed the procedure including the risks, benefits and alternatives for the proposed anesthesia with the patient or authorized representative who has indicated his/her understanding and acceptance.     Dental advisory given  Plan Discussed with: CRNA  Anesthesia Plan Comments:         Anesthesia Quick Evaluation

## 2021-04-23 ENCOUNTER — Encounter (HOSPITAL_COMMUNITY): Payer: Self-pay | Admitting: Cardiology

## 2021-05-04 ENCOUNTER — Other Ambulatory Visit (HOSPITAL_COMMUNITY): Payer: Self-pay

## 2021-05-04 DIAGNOSIS — I480 Paroxysmal atrial fibrillation: Secondary | ICD-10-CM

## 2021-05-04 MED ORDER — RIVAROXABAN 20 MG PO TABS: 20.0000 mg | ORAL_TABLET | Freq: Every day | ORAL | 0 refills | Status: DC

## 2021-05-05 ENCOUNTER — Telehealth: Payer: Self-pay | Admitting: *Deleted

## 2021-05-05 NOTE — Telephone Encounter (Signed)
Patient's friend Ms. Nelson called and asked office to contact patient.  Contacted by this RN, patient states he tested positive for Covid with home test. He contacted his PCP and states they were calling in a medication for him.   He wants to know how his appointments and treatment on 8/22 will be rescheduled. Informed him that once clinic supervision and MD informed, they will advise and he will be contacted.  Advised patient that this information will be recorded in his chart and that message will be sent to Dr. Alvy Bimler.

## 2021-05-05 NOTE — Telephone Encounter (Signed)
Patient contacted by this RN after office received VM from Ms. Meda Coffee - patient's friend. Ms. Meda Coffee expressed concern r/t patient's cough.   Patient describes cough as persistent and non-productive. States he coughs whenever he takes a deep breath. Home Pulse Oximeter 95-98%. Denies fever. Able to sleep lying flat. States he has no energy. Patient states he has appt with Dr. Alvy Bimler on Monday and was going to discuss this with her then, but that his friend was concerned and thought he should call today.   Recommended he contact PCP at this time.  Patient stated he would contact Dr. Lysle Rubens and ask about appt with him tomorrow.   Encouraged patient to see immediate medical attention if Pulse Ox readings decrease suddenly or if he experiences sudden shortness of breath or any pain with respirations. He verbalized understanding.   Informed him Dr. Alvy Bimler out of office today. Information shared by patient entered in his chart. He verbalized understanding.

## 2021-05-06 ENCOUNTER — Telehealth: Payer: Self-pay | Admitting: Hematology and Oncology

## 2021-05-06 NOTE — Telephone Encounter (Signed)
We need to cancel and reschedule everything 3 weeks away from his positive test Hassan Rowan, please move everything and make sure he is prescribed antiviral

## 2021-05-06 NOTE — Telephone Encounter (Signed)
R/s appts per 8/18 sch msg. Pt aware.

## 2021-05-06 NOTE — Telephone Encounter (Signed)
Called and given below message from Dr. Alvy Bimler. He verbalized understanding. PCP prescribed antiviral, he feels better today. Appts canceled for next week and scheduling message sent to reschedule 21 days from positive test on 8/17.

## 2021-05-10 ENCOUNTER — Other Ambulatory Visit: Payer: Medicare Other

## 2021-05-10 ENCOUNTER — Ambulatory Visit: Payer: Medicare Other | Admitting: Hematology and Oncology

## 2021-05-10 ENCOUNTER — Ambulatory Visit: Payer: Medicare Other

## 2021-05-12 ENCOUNTER — Ambulatory Visit: Payer: Medicare Other

## 2021-05-17 ENCOUNTER — Ambulatory Visit
Admission: RE | Admit: 2021-05-17 | Discharge: 2021-05-17 | Disposition: A | Discharge status: Home / Self Care | Payer: Medicare Other | Source: Ambulatory Visit | Attending: Internal Medicine | Admitting: Internal Medicine

## 2021-05-17 ENCOUNTER — Other Ambulatory Visit: Payer: Self-pay

## 2021-05-17 ENCOUNTER — Other Ambulatory Visit: Payer: Self-pay | Admitting: Internal Medicine

## 2021-05-17 DIAGNOSIS — R059 Cough, unspecified: Secondary | ICD-10-CM

## 2021-05-17 DIAGNOSIS — R0600 Dyspnea, unspecified: Secondary | ICD-10-CM | POA: Diagnosis not present

## 2021-05-24 DIAGNOSIS — E782 Mixed hyperlipidemia: Secondary | ICD-10-CM | POA: Diagnosis not present

## 2021-05-24 DIAGNOSIS — E039 Hypothyroidism, unspecified: Secondary | ICD-10-CM | POA: Diagnosis not present

## 2021-05-24 DIAGNOSIS — N400 Enlarged prostate without lower urinary tract symptoms: Secondary | ICD-10-CM | POA: Diagnosis not present

## 2021-05-24 DIAGNOSIS — N183 Chronic kidney disease, stage 3 unspecified: Secondary | ICD-10-CM | POA: Diagnosis not present

## 2021-05-24 DIAGNOSIS — E119 Type 2 diabetes mellitus without complications: Secondary | ICD-10-CM | POA: Diagnosis not present

## 2021-05-24 DIAGNOSIS — N182 Chronic kidney disease, stage 2 (mild): Secondary | ICD-10-CM | POA: Diagnosis not present

## 2021-05-24 DIAGNOSIS — I2510 Atherosclerotic heart disease of native coronary artery without angina pectoris: Secondary | ICD-10-CM | POA: Diagnosis not present

## 2021-05-24 DIAGNOSIS — I10 Essential (primary) hypertension: Secondary | ICD-10-CM | POA: Diagnosis not present

## 2021-05-24 DIAGNOSIS — E1122 Type 2 diabetes mellitus with diabetic chronic kidney disease: Secondary | ICD-10-CM | POA: Diagnosis not present

## 2021-05-25 ENCOUNTER — Other Ambulatory Visit: Payer: Self-pay | Admitting: Hematology and Oncology

## 2021-05-26 MED FILL — Dexamethasone Sodium Phosphate Inj 100 MG/10ML: INTRAMUSCULAR | Qty: 1 | Status: AC

## 2021-05-26 MED FILL — Fosaprepitant Dimeglumine For IV Infusion 150 MG (Base Eq): INTRAVENOUS | Qty: 5 | Status: AC

## 2021-05-27 ENCOUNTER — Telehealth: Payer: Self-pay

## 2021-05-27 ENCOUNTER — Inpatient Hospital Stay (HOSPITAL_BASED_OUTPATIENT_CLINIC_OR_DEPARTMENT_OTHER): Payer: Medicare Other | Admitting: Hematology and Oncology

## 2021-05-27 ENCOUNTER — Inpatient Hospital Stay: Payer: Medicare Other | Attending: Hematology and Oncology

## 2021-05-27 ENCOUNTER — Encounter: Payer: Self-pay | Admitting: Hematology and Oncology

## 2021-05-27 ENCOUNTER — Inpatient Hospital Stay: Payer: Medicare Other

## 2021-05-27 ENCOUNTER — Other Ambulatory Visit: Payer: Self-pay

## 2021-05-27 VITALS — BP 141/85 | HR 73 | Temp 97.8°F | Resp 18 | Ht 76.0 in | Wt 295.4 lb

## 2021-05-27 DIAGNOSIS — N184 Chronic kidney disease, stage 4 (severe): Secondary | ICD-10-CM | POA: Insufficient documentation

## 2021-05-27 DIAGNOSIS — R0602 Shortness of breath: Secondary | ICD-10-CM | POA: Diagnosis not present

## 2021-05-27 DIAGNOSIS — Z7901 Long term (current) use of anticoagulants: Secondary | ICD-10-CM | POA: Diagnosis not present

## 2021-05-27 DIAGNOSIS — R918 Other nonspecific abnormal finding of lung field: Secondary | ICD-10-CM | POA: Diagnosis not present

## 2021-05-27 DIAGNOSIS — I420 Dilated cardiomyopathy: Secondary | ICD-10-CM | POA: Diagnosis not present

## 2021-05-27 DIAGNOSIS — R0600 Dyspnea, unspecified: Secondary | ICD-10-CM | POA: Insufficient documentation

## 2021-05-27 DIAGNOSIS — T148XXA Other injury of unspecified body region, initial encounter: Secondary | ICD-10-CM | POA: Insufficient documentation

## 2021-05-27 DIAGNOSIS — C9110 Chronic lymphocytic leukemia of B-cell type not having achieved remission: Secondary | ICD-10-CM

## 2021-05-27 DIAGNOSIS — Z9221 Personal history of antineoplastic chemotherapy: Secondary | ICD-10-CM | POA: Diagnosis not present

## 2021-05-27 DIAGNOSIS — R059 Cough, unspecified: Secondary | ICD-10-CM | POA: Diagnosis not present

## 2021-05-27 DIAGNOSIS — Z5111 Encounter for antineoplastic chemotherapy: Secondary | ICD-10-CM | POA: Insufficient documentation

## 2021-05-27 DIAGNOSIS — R634 Abnormal weight loss: Secondary | ICD-10-CM | POA: Diagnosis not present

## 2021-05-27 DIAGNOSIS — E119 Type 2 diabetes mellitus without complications: Secondary | ICD-10-CM

## 2021-05-27 DIAGNOSIS — C8338 Diffuse large B-cell lymphoma, lymph nodes of multiple sites: Secondary | ICD-10-CM

## 2021-05-27 DIAGNOSIS — Z79899 Other long term (current) drug therapy: Secondary | ICD-10-CM | POA: Diagnosis not present

## 2021-05-27 DIAGNOSIS — R053 Chronic cough: Secondary | ICD-10-CM | POA: Diagnosis not present

## 2021-05-27 DIAGNOSIS — Z5112 Encounter for antineoplastic immunotherapy: Secondary | ICD-10-CM | POA: Insufficient documentation

## 2021-05-27 DIAGNOSIS — Z7189 Other specified counseling: Secondary | ICD-10-CM

## 2021-05-27 DIAGNOSIS — D638 Anemia in other chronic diseases classified elsewhere: Secondary | ICD-10-CM | POA: Diagnosis not present

## 2021-05-27 DIAGNOSIS — Z5189 Encounter for other specified aftercare: Secondary | ICD-10-CM | POA: Insufficient documentation

## 2021-05-27 LAB — CBC WITH DIFFERENTIAL (CANCER CENTER ONLY)
Abs Immature Granulocytes: 0.07 10*3/uL (ref 0.00–0.07)
Basophils Absolute: 0 10*3/uL (ref 0.0–0.1)
Basophils Relative: 0 %
Eosinophils Absolute: 0.5 10*3/uL (ref 0.0–0.5)
Eosinophils Relative: 6 %
HCT: 37.8 % — ABNORMAL LOW (ref 39.0–52.0)
Hemoglobin: 12.3 g/dL — ABNORMAL LOW (ref 13.0–17.0)
Immature Granulocytes: 1 %
Lymphocytes Relative: 9 %
Lymphs Abs: 0.8 10*3/uL (ref 0.7–4.0)
MCH: 28.2 pg (ref 26.0–34.0)
MCHC: 32.5 g/dL (ref 30.0–36.0)
MCV: 86.7 fL (ref 80.0–100.0)
Monocytes Absolute: 0.7 10*3/uL (ref 0.1–1.0)
Monocytes Relative: 8 %
Neutro Abs: 6.8 10*3/uL (ref 1.7–7.7)
Neutrophils Relative %: 76 %
Platelet Count: 162 10*3/uL (ref 150–400)
RBC: 4.36 MIL/uL (ref 4.22–5.81)
RDW: 16.5 % — ABNORMAL HIGH (ref 11.5–15.5)
WBC Count: 8.9 10*3/uL (ref 4.0–10.5)
nRBC: 0 % (ref 0.0–0.2)

## 2021-05-27 LAB — CMP (CANCER CENTER ONLY)
ALT: 11 U/L (ref 0–44)
AST: 15 U/L (ref 15–41)
Albumin: 3.4 g/dL — ABNORMAL LOW (ref 3.5–5.0)
Alkaline Phosphatase: 87 U/L (ref 38–126)
Anion gap: 12 (ref 5–15)
BUN: 35 mg/dL — ABNORMAL HIGH (ref 8–23)
CO2: 18 mmol/L — ABNORMAL LOW (ref 22–32)
Calcium: 8.9 mg/dL (ref 8.9–10.3)
Chloride: 105 mmol/L (ref 98–111)
Creatinine: 2.37 mg/dL — ABNORMAL HIGH (ref 0.61–1.24)
GFR, Estimated: 27 mL/min — ABNORMAL LOW (ref >60)
Glucose, Bld: 209 mg/dL — ABNORMAL HIGH (ref 70–99)
Potassium: 4.5 mmol/L (ref 3.5–5.1)
Sodium: 135 mmol/L (ref 135–145)
Total Bilirubin: 0.7 mg/dL (ref 0.3–1.2)
Total Protein: 6.5 g/dL (ref 6.5–8.1)

## 2021-05-27 MED ORDER — ACETAMINOPHEN 325 MG PO TABS
650.0000 mg | ORAL_TABLET | Freq: Once | ORAL | Status: AC
  Administered 2021-05-27: 650 mg via ORAL
  Filled 2021-05-27: qty 2

## 2021-05-27 MED ORDER — SODIUM CHLORIDE 0.9% FLUSH
10.0000 mL | Freq: Once | INTRAVENOUS | Status: AC
  Administered 2021-05-27: 10 mL

## 2021-05-27 MED ORDER — SODIUM CHLORIDE 0.9% FLUSH
10.0000 mL | INTRAVENOUS | Status: DC | PRN
  Administered 2021-05-27: 10 mL

## 2021-05-27 MED ORDER — HEPARIN SOD (PORK) LOCK FLUSH 100 UNIT/ML IV SOLN
500.0000 [IU] | Freq: Once | INTRAVENOUS | Status: AC | PRN
  Administered 2021-05-27: 500 [IU]

## 2021-05-27 MED ORDER — PALONOSETRON HCL INJECTION 0.25 MG/5ML
0.2500 mg | Freq: Once | INTRAVENOUS | Status: AC
  Administered 2021-05-27: 0.25 mg via INTRAVENOUS
  Filled 2021-05-27: qty 5

## 2021-05-27 MED ORDER — SODIUM CHLORIDE 0.9 % IV SOLN
375.0000 mg/m2 | Freq: Once | INTRAVENOUS | Status: AC
  Administered 2021-05-27: 1000 mg via INTRAVENOUS
  Filled 2021-05-27: qty 100

## 2021-05-27 MED ORDER — DIPHENHYDRAMINE HCL 25 MG PO CAPS
25.0000 mg | ORAL_CAPSULE | Freq: Once | ORAL | Status: AC
  Administered 2021-05-27: 25 mg via ORAL
  Filled 2021-05-27: qty 1

## 2021-05-27 MED ORDER — SODIUM CHLORIDE 0.9 % IV SOLN
1.0000 mg | Freq: Once | INTRAVENOUS | Status: AC
  Administered 2021-05-27: 1 mg via INTRAVENOUS
  Filled 2021-05-27: qty 1

## 2021-05-27 MED ORDER — SODIUM CHLORIDE 0.9 % IV SOLN
150.0000 mg | Freq: Once | INTRAVENOUS | Status: AC
  Administered 2021-05-27: 150 mg via INTRAVENOUS
  Filled 2021-05-27: qty 150

## 2021-05-27 MED ORDER — SODIUM CHLORIDE 0.9 % IV SOLN
10.0000 mg | Freq: Once | INTRAVENOUS | Status: AC
  Administered 2021-05-27: 10 mg via INTRAVENOUS
  Filled 2021-05-27: qty 10

## 2021-05-27 MED ORDER — SODIUM CHLORIDE 0.9 % IV SOLN
375.0000 mg/m2 | Freq: Once | INTRAVENOUS | Status: AC
  Administered 2021-05-27: 1000 mg via INTRAVENOUS
  Filled 2021-05-27: qty 50

## 2021-05-27 MED ORDER — SODIUM CHLORIDE 0.9 % IV SOLN: Freq: Once | INTRAVENOUS | Status: AC

## 2021-05-27 MED ORDER — DOXORUBICIN HCL CHEMO IV INJECTION 2 MG/ML
25.0000 mg/m2 | Freq: Once | INTRAVENOUS | Status: AC
  Administered 2021-05-27: 68 mg via INTRAVENOUS
  Filled 2021-05-27: qty 34

## 2021-05-27 NOTE — Telephone Encounter (Signed)
Given copy of schedule. Given echo appt 9/19 at 9 am at [facility], arrive at [place]. He verbalized understanding.  Called office staff at Dr. Marlou Porch office to see if they wanted to do Echo at office. Per office staff, he may have echo anywhere as long as they can see in epic.

## 2021-05-27 NOTE — Assessment & Plan Note (Signed)
He will proceed with cycle 3 of treatment today I will reduce the dose of chemotherapy a little bit due to recent weight loss I will order a PET CT scan to be done prior to cycle 4 of therapy I will also order repeat echocardiogram before his next visit

## 2021-05-27 NOTE — Assessment & Plan Note (Signed)
He has chronic cough, recent exacerbated by COVID infection He has good oxygen saturation Observe closely

## 2021-05-27 NOTE — Assessment & Plan Note (Signed)
He has intermittent chronic kidney disease We will continue reduced dose chemotherapy as before

## 2021-05-27 NOTE — Assessment & Plan Note (Signed)
His blood sugar is high likely due to recent prednisone therapy Observe closely

## 2021-05-27 NOTE — Patient Instructions (Signed)
[facility] ONCOLOGY  Discharge Instructions: Thank you for choosing [facility] to provide your oncology and hematology care.   If you have a lab appointment with the [facility], please go directly to the [facility] and check in at the registration area.   Wear comfortable clothing and clothing appropriate for easy access to any Portacath or PICC line.   We strive to give you quality time with your provider. You may need to reschedule your appointment if you arrive late (15 or more minutes).  Arriving late affects you and other patients whose appointments are after yours.  Also, if you miss three or more appointments without notifying the office, you may be dismissed from the clinic at the provider's discretion.      For prescription refill requests, have your pharmacy contact our office and allow 72 hours for refills to be completed.    Today you received the following chemotherapy and/or immunotherapy agents : Rituxan, Adriamycin, Cytoxan, Vincristine     To help prevent nausea and vomiting after your treatment, we encourage you to take your nausea medication as directed.  BELOW ARE SYMPTOMS THAT SHOULD BE REPORTED IMMEDIATELY: *FEVER GREATER THAN 100.4 F (38 C) OR HIGHER *CHILLS OR SWEATING *NAUSEA AND VOMITING THAT IS NOT CONTROLLED WITH YOUR NAUSEA MEDICATION *UNUSUAL SHORTNESS OF BREATH *UNUSUAL BRUISING OR BLEEDING *URINARY PROBLEMS (pain or burning when urinating, or frequent urination) *BOWEL PROBLEMS (unusual diarrhea, constipation, pain near the anus) TENDERNESS IN MOUTH AND THROAT WITH OR WITHOUT PRESENCE OF ULCERS (sore throat, sores in mouth, or a toothache) UNUSUAL RASH, SWELLING OR PAIN  UNUSUAL VAGINAL DISCHARGE OR ITCHING   Items with * indicate a potential emergency and should be followed up as soon as possible or go to the Emergency Department if any problems should occur.  Please show the CHEMOTHERAPY ALERT CARD or  IMMUNOTHERAPY ALERT CARD at check-in to the Emergency Department and triage nurse.  Should you have questions after your visit or need to cancel or reschedule your appointment, please contact [facility]  Dept: [phone]  and follow the prompts.  Office hours are 8:00 a.m. to 4:30 p.m. Monday - Friday. Please note that voicemails left after 4:00 p.m. may not be returned until the following business day.  We are closed weekends and major holidays. You have access to a nurse at all times for urgent questions. Please call the main number to the clinic Dept: [phone] and follow the prompts.   For any non-urgent questions, you may also contact your provider using MyChart. We now offer e-Visits for anyone 34 and older to request care online for non-urgent symptoms. For details visit [email or web address].   Also download the MyChart app! Go to the app store, search "MyChart", open the app, select [facility], and log in with your MyChart username and password.  Due to Covid, a mask is required upon entering the hospital/clinic. If you do not have a mask, one will be given to you upon arrival. For doctor visits, patients may have 1 support person aged 41 or older with them. For treatment visits, patients cannot have anyone with them due to current Covid guidelines and our immunocompromised population.

## 2021-05-27 NOTE — Progress Notes (Signed)
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[facility]) He will proceed with cycle 3 of treatment today I will reduce the dose of chemotherapy a little bit due to recent weight loss I will order a PET CT scan to be done prior to cycle 4 of therapy I will also order repeat echocardiogram before his next visit  Chronic kidney disease (CKD), 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HCC) He has intermittent chronic kidney disease We will continue reduced dose chemotherapy as before  Cough in adult He has chronic cough, recent exacerbated by COVID infection He has good oxygen saturation Observe closely  Diabetes mellitus, type II (HCC) His blood sugar is high likely due to recent prednisone therapy Observe closely  Orders Placed This Encounter  Procedures   NM PET Image Restage (PS) Skull Base to Thigh (F-18 FDG)    Standing Status:   Future    Standing Expiration Date:   05/27/2022    Order Specific Question:   If indicated for the ordered procedure, I authorize the administration of a radiopharmaceutical per Radiology protocol    Answer:   Yes    Order Specific Question:   Preferred imaging location?    Answer:   [id number]    Order Specific Question:   Radiology Contrast Protocol - do NOT remove file path    Answer:   [email or web address]   ECHOCARDIOGRAM COMPLETE    Standing Status:   Future    Standing Expiration Date:   05/27/2022    Order Specific Question:   Where should this test be performed    Answer:   External    Order Specific Question:   Perflutren DEFINITY (image enhancing agent) should be administered unless hypersensitivity or allergy exist    Answer:   Administer Perflutren    Order Specific Question:   Reason  for exam-Echo    Answer:   Chemo  Z09    All questions were answered. The patient knows to call the clinic with any problems, questions or concerns. The total time spent in the appointment was 30 minutes encounter with patients including review of chart and various tests results, discussions about plan of care and coordination of care plan   Artis Delay, MD 05/27/2021 8:49 AM  INTERVAL HISTORY: Please see below for problem oriented charting. he returns for treatment follow-up for cycle 3 of R-CHOP chemotherapy for diffuse large B-cell lymphoma He had recent cardioversion He has recovered from recent COVID-19 infection but complain of fatigue and intermittent cough Denies recent chest pain He has some shortness of breath on exertion but nothing new He has some bruising but no bleeding  REVIEW OF SYSTEMS:   Constitutional: Denies fevers, chills or abnormal weight loss Eyes: Denies blurriness of vision Ears, nose, mouth, throat, and face: Denies mucositis or sore throat Cardiovascular: Denies palpitation, chest discomfort or lower extremity swelling Gastrointestinal:  Denies nausea, heartburn or change in bowel habits Skin: Denies abnormal skin rashes Lymphatics: Denies new lymphadenopathy  Neurological:Denies numbness, tingling or new weaknesses Behavioral/Psych: Mood is stable, no new changes  All other systems were reviewed with the patient and are negative.  I have reviewed the past medical history, past surgical history, social history and family history with the patient and they are unchanged from previous note.  ALLERGIES:  is allergic to lipitor [atorvastatin] and metformin.  MEDICATIONS:  Current Outpatient Medications  Medication Sig Dispense Refill   acyclovir (ZOVIRAX) 400 MG tablet Take 1 tablet (400 mg total) by mouth daily. 30 tablet 3   allopurinol (ZYLOPRIM) 300 MG tablet TAKE 1 TABLET BY MOUTH EVERY DAY 90 tablet 1   amiodarone (PACERONE) 200 MG tablet Take 1  tablet (200 mg total) by mouth 2 (two) times daily. 60 tablet 3   doxepin (SINEQUAN) 10 MG capsule Take 10 mg by mouth at bedtime.     glimepiride (AMARYL) 2 MG tablet Take 2 mg by mouth daily with breakfast.     glucose blood test strip Dispense glucometer, strips and lancets preferred by patient's insurance (DX: E11.9 / type 2 DM)     levothyroxine (SYNTHROID) 25 MCG tablet Take 25 mcg by mouth daily before breakfast.     lidocaine-prilocaine (EMLA) cream Apply to affected area once 30 g 3   Needle, Disp, (HYPODERMIC NEEDLE 26GX5/8") 26G X 5/8" MISC See admin instructions.     Omega-3 Fatty Acids (FISH OIL PO) Take 2 capsules by mouth daily.     ondansetron (ZOFRAN) 8 MG tablet Take 8 mg by mouth every 8 (eight) hours as needed.     predniSONE (DELTASONE) 20 MG tablet Take 2 tablets (40 mg total) by mouth daily. Take with food on for 4 days after chemotherapy every 3 weeks 8 tablet 5   prochlorperazine (COMPAZINE) 10 MG tablet Take 1 tablet (10 mg total) by mouth every 6 (six) hours as needed for nausea or vomiting. 30 tablet 0   rivaroxaban (XARELTO) 20 MG TABS tablet Take 1 tablet (20 mg total) by mouth daily with supper. 35 tablet 0   rosuvastatin (CRESTOR) 10 MG tablet Take 10 mg by mouth daily.     tamsulosin (FLOMAX) 0.4 MG CAPS capsule Take 0.4 mg by mouth daily.     No current facility-administered medications for this visit.    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   06/17/2014 Procedure   The patient has placement of Infuse-a-Port.   06/18/2014 Imaging   CT scan of the chest, abdomen and pelvis showed diffuse lymphadenopathy and splenomegaly.   06/19/2014 Bone Marrow Biopsy   Bone  marrow aspirate and biopsy show CLL.   06/24/2014 - 11/18/2014 Chemotherapy   He received 6 cycles of Obinutuzumab and chlorambucil   09/15/2014 Imaging   Repeat CT scan of the chest, abdomen and pelvis show greater than 50% reduction in lymphadenopathy and splenomegaly   12/19/2014 Imaging   Repeat CT scan showed complete resolution of lymphadenopathy and splenomegaly.   04/22/2020 Imaging   CT neck 1. Cervical adenopathy involving the bilateral level 1B, right 2/3 and bilateral level 5 nodal stations. 2. Prominent to mildly enlarged upper mediastinal nodes. 3. Prominent subcentimeter right intraparotid node.     06/03/2020 -  Chemotherapy   The patient had rituximab for chemotherapy treatment.     08/31/2020 Imaging   CT neck 1. Regression of Leukemia. Resolved widespread cervical lymphadenopathy since August. Largest residual left level IIIb node now 8-9 mm short axis (previously 16 mm). 2.  CT Chest, Abdomen, and Pelvis today are reported separately.  08/31/2020 Imaging   Limited evaluation due to lack of intravenous contrast administration.   7.1 cm left perirenal soft tissue mass along the posterior left upper kidney, suspicious for perirenal lymphoma, although poorly evaluated. This is essentially new from May 2021.   Possible periureteral soft tissue along the right proximal collecting system/ureter, equivocal.   New 12 mm right lower lobe pulmonary nodule, suspicious for metastasis/pulmonary lymphoma. Additional 6 mm (mean diameter) subpleural nodule in the lingula is new from 2016, indeterminate.   No suspicious lymphadenopathy in the chest, abdomen, or pelvis. Spleen is normal in size.     02/05/2021 Imaging   1. Interval enlargement of multiple subcutaneous soft tissue nodules or lymph nodes, in the left axilla and overlying the sacrum and left buttock. 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 2. Significant interval increase in size of right lower lobe and  lingular pulmonary nodules, now with large subpleural opacities, consistent with worsened pulmonary lymphomatous involvement. 3. Interval decrease in size of a left perinephric soft tissue mass.  4. Constellation of findings above suggest mixed response to treatment. 5. 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 No clear evidence of honeycombing. 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 Am Rosezetta Schlatter Crit Care Med Vol 198, Iss 5, [phone], May 20 2017. 6. Coronary artery disease.   02/05/2021 Pathology Results   A. 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  The appearance is diffuse with lack of atypical follicles.  Flow cytometric analysis was attempted ([id number]) but there were insufficient cells present in the sample for analysis. Hence a battery of immunohistochemical stains was performed and shows that the atypical lymphoid cells are positive for CD20, CD79a, PAX5, BCL-2, and MUM 1.  Only scattered cells are positive for cytoplasmic kappa and not lambda.  There is patchy weak staining for BCL6.  No significant staining is seen with CD10, CD30, CD34, CD138, cyclin D1, or EBV in situ hybridization.  Ki-67 shows variable increased expression ranging from 30% to over 50% in some areas.  There is an admixed T-cell   population in the background to a lesser extent as seen with CD3 and CD5 and there is no apparent co-expression of CD5 in B-cell areas.  The findings are most consistent with involvement by large B-cell lymphoma,  ABC type.  Given the previous history of chronic lymphocytic leukemia/small lymphocytic lymphoma, this likely represents high-grade transformation.  Clinical correlation is recommended   02/26/2021 PET scan   1. Evidence of multi organ lymphoma recurrence. 2. Enlarging foci of hypermetabolic peripheral consolidation in both lungs consistent pulmonary lymphoma. 3. Multiple foci of hypermetabolic subcutaneous nodularity consistent with subcutaneous lymphoma. 4. Solitary hypermetabolic nodal metastasis in the central mesentery and peritoneal implant along the RIGHT iliacus muscle. 5. THree foci of metabolic activity in the skeleton consistent with skeletal lymphoma.     03/08/2021 Procedure   Status post CT-guided biopsy of right lower lobe lung mass   03/12/2021 Cancer Staging  Staging form: Hodgkin and Non-Hodgkin Lymphoma, AJCC 7th Edition - Clinical stage from 03/12/2021: Stage IV - Signed by [name], MD on 03/12/2021 Staged by: Managing physician Stage prefix: Recurrence Biopsy of metastatic site performed: Yes Source of metastatic specimen: Lung   03/17/2021 Echocardiogram    1. Left ventricular ejection fraction, by estimation, is 55 to 60%. The left ventricle has normal function. The left ventricle has no regional wall motion abnormalities. There is moderate concentric left ventricular hypertrophy. Left ventricular diastolic parameters are indeterminate. The average left ventricular global longitudinal strain is -20.4 %. The global longitudinal strain is normal.  2. Right ventricular systolic function is normal. The right ventricular size is normal. Tricuspid regurgitation signal is inadequate for assessing PA pressure.  3. The mitral valve is grossly normal. Mild mitral  valve regurgitation. No evidence of mitral stenosis.  4. The aortic valve is tricuspid. There is mild calcification of the aortic valve. Aortic valve regurgitation is not visualized. Mild aortic valve sclerosis is present, with no evidence of aortic valve stenosis.  5. The inferior vena cava is normal in size with greater than 50% respiratory variability, suggesting right atrial pressure of 3 mmHg.   03/19/2021 Procedure   Successful placement of a right internal jugular approach power injectable Port-A-Cath. The catheter is ready for immediate use.     03/29/2021 -  Chemotherapy    Patient is on Treatment Plan: NON-HODGKINS LYMPHOMA R-CHOP Q21D         PHYSICAL EXAMINATION: ECOG PERFORMANCE STATUS: 1 - Symptomatic but completely ambulatory  Vitals:   05/27/21 0803  BP: (!) 141/85  Pulse: 73  Resp: 18  Temp: 97.8 F (36.6 C)  SpO2: 97%   Filed Weights   05/27/21 0803  Weight: 295 lb 6.4 oz (134 kg)    GENERAL:alert, no distress and comfortable SKIN: Noted skin bruises EYES: normal, Conjunctiva are pink and non-injected, sclera clear OROPHARYNX:no exudate, no erythema and lips, buccal mucosa, and tongue normal  NECK: supple, thyroid normal size, non-tender, without nodularity LYMPH: Mild crackles bilateral bases LUNGS: clear to auscultation and percussion with normal breathing effort HEART: regular rate & rhythm and no murmurs and no lower extremity edema ABDOMEN:abdomen soft, non-tender and normal bowel sounds Musculoskeletal:no cyanosis of digits and no clubbing  NEURO: alert & oriented x 3 with fluent speech, no focal motor/sensory deficits  LABORATORY DATA:  I have reviewed the data as listed    Component Value Date/Time   NA 135 05/27/2021 0738   NA 138 10/25/2018 1120   NA 140 06/25/2015 0758   K 4.5 05/27/2021 0738   K 3.9 06/25/2015 0758   CL 105 05/27/2021 0738   CL 101 03/11/2013 1031   CO2 18 (L) 05/27/2021 0738   CO2 25 06/25/2015 0758   GLUCOSE 209  (H) 05/27/2021 0738   GLUCOSE 140 06/25/2015 0758   GLUCOSE 173 (H) 03/11/2013 1031   BUN 35 (H) 05/27/2021 0738   BUN 16 10/25/2018 1120   BUN 15.9 06/25/2015 0758   CREATININE 2.37 (H) 05/27/2021 0738   CREATININE 1.0 06/25/2015 0758   CALCIUM 8.9 05/27/2021 0738   CALCIUM 9.3 06/25/2015 0758   PROT 6.5 05/27/2021 0738   PROT 6.7 06/25/2015 0758   ALBUMIN 3.4 (L) 05/27/2021 0738   ALBUMIN 3.8 06/25/2015 0758   AST 15 05/27/2021 0738   AST 15 06/25/2015 0758   ALT 11 05/27/2021 0738   ALT 14 06/25/2015 0758   ALKPHOS 87 05/27/2021 0738   ALKPHOS 61 06/25/2015  0758   BILITOT 0.7 05/27/2021 0738   BILITOT 0.95 06/25/2015 0758   GFRNONAA 27 (L) 05/27/2021 0738   GFRAA 29 (L) 06/09/2020 0847   GFRAA 32 (L) 06/01/2020 0957    No results found for: SPEP, UPEP  Lab Results  Component Value Date   WBC 8.9 05/27/2021   NEUTROABS 6.8 05/27/2021   HGB 12.3 (L) 05/27/2021   HCT 37.8 (L) 05/27/2021   MCV 86.7 05/27/2021   PLT 162 05/27/2021      Chemistry      Component Value Date/Time   NA 135 05/27/2021 0738   NA 138 10/25/2018 1120   NA 140 06/25/2015 0758   K 4.5 05/27/2021 0738   K 3.9 06/25/2015 0758   CL 105 05/27/2021 0738   CL 101 03/11/2013 1031   CO2 18 (L) 05/27/2021 0738   CO2 25 06/25/2015 0758   BUN 35 (H) 05/27/2021 0738   BUN 16 10/25/2018 1120   BUN 15.9 06/25/2015 0758   CREATININE 2.37 (H) 05/27/2021 0738   CREATININE 1.0 06/25/2015 0758      Component Value Date/Time   CALCIUM 8.9 05/27/2021 0738   CALCIUM 9.3 06/25/2015 0758   ALKPHOS 87 05/27/2021 0738   ALKPHOS 61 06/25/2015 0758   AST 15 05/27/2021 0738   AST 15 06/25/2015 0758   ALT 11 05/27/2021 0738   ALT 14 06/25/2015 0758   BILITOT 0.7 05/27/2021 0738   BILITOT 0.95 06/25/2015 0758       RADIOGRAPHIC STUDIES: I have personally reviewed the radiological images as listed and agreed with the findings in the report. DG Chest 2 View  Result Date: 05/18/2021 CLINICAL DATA:  Cough  and dyspnea upon exertion. EXAM: CHEST - 2 VIEW COMPARISON:  April 19, 2021 FINDINGS: There is stable right-sided venous Port-A-Cath positioning. There is no evidence of acute infiltrate, pleural effusion or pneumothorax. Stable, ill-defined nodular opacities are seen within the lateral aspect of the right lung base. The cardiac silhouette is borderline in size and unchanged in appearance. The visualized skeletal structures are unremarkable. IMPRESSION: Stable exam without active cardiopulmonary disease. Electronically Signed   By: Virgina Norfolk M.D.   On: 05/18/2021 01:39

## 2021-05-29 ENCOUNTER — Other Ambulatory Visit: Payer: Self-pay

## 2021-05-29 ENCOUNTER — Inpatient Hospital Stay: Payer: Medicare Other

## 2021-05-29 VITALS — BP 158/78 | HR 56 | Temp 97.5°F | Resp 18

## 2021-05-29 DIAGNOSIS — N184 Chronic kidney disease, stage 4 (severe): Secondary | ICD-10-CM | POA: Diagnosis not present

## 2021-05-29 DIAGNOSIS — Z5111 Encounter for antineoplastic chemotherapy: Secondary | ICD-10-CM | POA: Diagnosis not present

## 2021-05-29 DIAGNOSIS — R059 Cough, unspecified: Secondary | ICD-10-CM | POA: Diagnosis not present

## 2021-05-29 DIAGNOSIS — C8338 Diffuse large B-cell lymphoma, lymph nodes of multiple sites: Secondary | ICD-10-CM

## 2021-05-29 DIAGNOSIS — Z5112 Encounter for antineoplastic immunotherapy: Secondary | ICD-10-CM | POA: Diagnosis not present

## 2021-05-29 DIAGNOSIS — Z5189 Encounter for other specified aftercare: Secondary | ICD-10-CM | POA: Diagnosis not present

## 2021-05-29 DIAGNOSIS — Z7189 Other specified counseling: Secondary | ICD-10-CM

## 2021-05-29 MED ORDER — PEGFILGRASTIM-JMDB 6 MG/0.6ML ~~LOC~~ SOSY
6.0000 mg | PREFILLED_SYRINGE | Freq: Once | SUBCUTANEOUS | Status: AC
  Administered 2021-05-29: 6 mg via SUBCUTANEOUS
  Filled 2021-05-29: qty 0.6

## 2021-05-29 NOTE — Patient Instructions (Signed)

## 2021-05-31 ENCOUNTER — Other Ambulatory Visit (HOSPITAL_COMMUNITY): Payer: Self-pay

## 2021-05-31 ENCOUNTER — Other Ambulatory Visit: Payer: Medicare Other

## 2021-05-31 ENCOUNTER — Ambulatory Visit: Payer: Medicare Other

## 2021-05-31 ENCOUNTER — Ambulatory Visit: Payer: Medicare Other | Admitting: Hematology and Oncology

## 2021-05-31 MED ORDER — AMIODARONE HCL 200 MG PO TABS: 200.0000 mg | ORAL_TABLET | Freq: Every day | ORAL | 3 refills | Status: AC

## 2021-06-02 ENCOUNTER — Ambulatory Visit: Payer: Medicare Other

## 2021-06-05 ENCOUNTER — Other Ambulatory Visit: Payer: Self-pay | Admitting: Hematology and Oncology

## 2021-06-05 DIAGNOSIS — Z7189 Other specified counseling: Secondary | ICD-10-CM

## 2021-06-05 DIAGNOSIS — C9110 Chronic lymphocytic leukemia of B-cell type not having achieved remission: Secondary | ICD-10-CM

## 2021-06-07 ENCOUNTER — Ambulatory Visit (HOSPITAL_COMMUNITY)
Admission: RE | Admit: 2021-06-07 | Discharge: 2021-06-07 | Disposition: A | Discharge status: Home / Self Care | Payer: Medicare Other | Source: Ambulatory Visit | Attending: Hematology and Oncology | Admitting: Hematology and Oncology

## 2021-06-07 ENCOUNTER — Other Ambulatory Visit: Payer: Self-pay

## 2021-06-07 ENCOUNTER — Encounter: Payer: Self-pay | Admitting: Hematology and Oncology

## 2021-06-07 DIAGNOSIS — Z0189 Encounter for other specified special examinations: Secondary | ICD-10-CM | POA: Diagnosis not present

## 2021-06-07 DIAGNOSIS — Z79899 Other long term (current) drug therapy: Secondary | ICD-10-CM | POA: Insufficient documentation

## 2021-06-07 DIAGNOSIS — I10 Essential (primary) hypertension: Secondary | ICD-10-CM | POA: Insufficient documentation

## 2021-06-07 DIAGNOSIS — I420 Dilated cardiomyopathy: Secondary | ICD-10-CM | POA: Insufficient documentation

## 2021-06-07 DIAGNOSIS — Z08 Encounter for follow-up examination after completed treatment for malignant neoplasm: Secondary | ICD-10-CM | POA: Diagnosis not present

## 2021-06-07 DIAGNOSIS — Z5181 Encounter for therapeutic drug level monitoring: Secondary | ICD-10-CM | POA: Insufficient documentation

## 2021-06-07 DIAGNOSIS — C8338 Diffuse large B-cell lymphoma, lymph nodes of multiple sites: Secondary | ICD-10-CM | POA: Insufficient documentation

## 2021-06-07 DIAGNOSIS — E119 Type 2 diabetes mellitus without complications: Secondary | ICD-10-CM | POA: Insufficient documentation

## 2021-06-07 LAB — ECHOCARDIOGRAM COMPLETE
Area-P 1/2: 2.99 cm2
S' Lateral: 3.1 cm

## 2021-06-07 NOTE — Progress Notes (Signed)
  Echocardiogram 2D Echocardiogram has been performed.  Derek [name] 06/07/2021, 9:50 AM

## 2021-06-09 ENCOUNTER — Other Ambulatory Visit (HOSPITAL_COMMUNITY): Payer: Self-pay

## 2021-06-09 ENCOUNTER — Ambulatory Visit (INDEPENDENT_AMBULATORY_CARE_PROVIDER_SITE_OTHER): Payer: Medicare Other

## 2021-06-09 ENCOUNTER — Other Ambulatory Visit: Payer: Self-pay

## 2021-06-09 DIAGNOSIS — U071 COVID-19: Secondary | ICD-10-CM

## 2021-06-09 MED ORDER — EPINEPHRINE 0.3 MG/0.3ML IJ SOAJ: 0.3000 mg | Freq: Once | INTRAMUSCULAR | Status: AC | PRN

## 2021-06-09 MED ORDER — SODIUM CHLORIDE 0.9 % IV SOLN: INTRAVENOUS | Status: AC | PRN

## 2021-06-09 MED ORDER — BEBTELOVIMAB 175 MG/2 ML IV (EUA)
175.0000 mg | Freq: Once | INTRAMUSCULAR | Status: AC
  Administered 2021-06-09: 175 mg via INTRAVENOUS

## 2021-06-09 MED ORDER — DIPHENHYDRAMINE HCL 50 MG/ML IJ SOLN: 50.0000 mg | Freq: Once | INTRAMUSCULAR | Status: AC | PRN

## 2021-06-09 MED ORDER — ALBUTEROL SULFATE HFA 108 (90 BASE) MCG/ACT IN AERS: 2.0000 | INHALATION_SPRAY | Freq: Once | RESPIRATORY_TRACT | Status: AC | PRN

## 2021-06-09 MED ORDER — FAMOTIDINE IN NACL 20-0.9 MG/50ML-% IV SOLN: 20.0000 mg | Freq: Once | INTRAVENOUS | Status: AC | PRN

## 2021-06-09 MED ORDER — METHYLPREDNISOLONE SODIUM SUCC 125 MG IJ SOLR: 125.0000 mg | Freq: Once | INTRAMUSCULAR | Status: AC | PRN

## 2021-06-09 NOTE — Progress Notes (Signed)
Diagnosis: COVID  Provider:  Marshell Garfinkel, MD  Procedure: Infusion  IV Type: Peripheral, IV Location: R Hand  Bebtelovimab, Dose: 175 mg  Infusion Start Time: 8682  Infusion Stop Time: 1534  Post Infusion IV Care: Observation period completed and Peripheral IV Discontinued  Discharge: Condition: Good, Destination: Home . AVS provided to patient.   Performed by:  Charlie Pitter, RN

## 2021-06-09 NOTE — Patient Instructions (Signed)
Patient was given drug information sheet for Bebtelovimab.  Also given cost estimate sheet for Bebtelovimab.  Patient reviewed documentation and questions were answered.  Patient would like to proceed with treatment at this time.   

## 2021-06-10 DIAGNOSIS — Z85828 Personal history of other malignant neoplasm of skin: Secondary | ICD-10-CM | POA: Diagnosis not present

## 2021-06-10 DIAGNOSIS — H61022 Chronic perichondritis of left external ear: Secondary | ICD-10-CM | POA: Diagnosis not present

## 2021-06-10 DIAGNOSIS — L821 Other seborrheic keratosis: Secondary | ICD-10-CM | POA: Diagnosis not present

## 2021-06-10 DIAGNOSIS — L570 Actinic keratosis: Secondary | ICD-10-CM | POA: Diagnosis not present

## 2021-06-10 DIAGNOSIS — L814 Other melanin hyperpigmentation: Secondary | ICD-10-CM | POA: Diagnosis not present

## 2021-06-11 ENCOUNTER — Ambulatory Visit (HOSPITAL_COMMUNITY)
Admission: RE | Admit: 2021-06-11 | Discharge: 2021-06-11 | Disposition: A | Discharge status: Home / Self Care | Payer: Medicare Other | Source: Ambulatory Visit | Attending: Hematology and Oncology | Admitting: Hematology and Oncology

## 2021-06-11 ENCOUNTER — Other Ambulatory Visit: Payer: Self-pay

## 2021-06-11 DIAGNOSIS — E119 Type 2 diabetes mellitus without complications: Secondary | ICD-10-CM | POA: Insufficient documentation

## 2021-06-11 DIAGNOSIS — C8338 Diffuse large B-cell lymphoma, lymph nodes of multiple sites: Secondary | ICD-10-CM | POA: Diagnosis not present

## 2021-06-11 DIAGNOSIS — Z8572 Personal history of non-Hodgkin lymphomas: Secondary | ICD-10-CM | POA: Diagnosis not present

## 2021-06-11 DIAGNOSIS — K5730 Diverticulosis of large intestine without perforation or abscess without bleeding: Secondary | ICD-10-CM | POA: Diagnosis not present

## 2021-06-11 DIAGNOSIS — I77810 Thoracic aortic ectasia: Secondary | ICD-10-CM | POA: Diagnosis not present

## 2021-06-11 DIAGNOSIS — I2510 Atherosclerotic heart disease of native coronary artery without angina pectoris: Secondary | ICD-10-CM | POA: Diagnosis not present

## 2021-06-11 LAB — GLUCOSE, CAPILLARY: Glucose-Capillary: 219 mg/dL — ABNORMAL HIGH (ref 70–99)

## 2021-06-11 MED ORDER — FLUDEOXYGLUCOSE F - 18 (FDG) INJECTION
15.3000 | Freq: Once | INTRAVENOUS | Status: AC
  Administered 2021-06-11: 13.86 via INTRAVENOUS

## 2021-06-14 MED FILL — Dexamethasone Sodium Phosphate Inj 100 MG/10ML: INTRAMUSCULAR | Qty: 1 | Status: AC

## 2021-06-14 MED FILL — Fosaprepitant Dimeglumine For IV Infusion 150 MG (Base Eq): INTRAVENOUS | Qty: 5 | Status: AC

## 2021-06-15 ENCOUNTER — Other Ambulatory Visit: Payer: Self-pay

## 2021-06-15 ENCOUNTER — Inpatient Hospital Stay: Payer: Medicare Other

## 2021-06-15 ENCOUNTER — Encounter: Payer: Self-pay | Admitting: Hematology and Oncology

## 2021-06-15 ENCOUNTER — Inpatient Hospital Stay (HOSPITAL_BASED_OUTPATIENT_CLINIC_OR_DEPARTMENT_OTHER): Payer: Medicare Other | Admitting: Hematology and Oncology

## 2021-06-15 DIAGNOSIS — R634 Abnormal weight loss: Secondary | ICD-10-CM | POA: Diagnosis not present

## 2021-06-15 DIAGNOSIS — Z5112 Encounter for antineoplastic immunotherapy: Secondary | ICD-10-CM | POA: Diagnosis not present

## 2021-06-15 DIAGNOSIS — Z7189 Other specified counseling: Secondary | ICD-10-CM

## 2021-06-15 DIAGNOSIS — N184 Chronic kidney disease, stage 4 (severe): Secondary | ICD-10-CM

## 2021-06-15 DIAGNOSIS — D638 Anemia in other chronic diseases classified elsewhere: Secondary | ICD-10-CM

## 2021-06-15 DIAGNOSIS — R918 Other nonspecific abnormal finding of lung field: Secondary | ICD-10-CM | POA: Diagnosis not present

## 2021-06-15 DIAGNOSIS — C8338 Diffuse large B-cell lymphoma, lymph nodes of multiple sites: Secondary | ICD-10-CM

## 2021-06-15 DIAGNOSIS — C9110 Chronic lymphocytic leukemia of B-cell type not having achieved remission: Secondary | ICD-10-CM

## 2021-06-15 DIAGNOSIS — R059 Cough, unspecified: Secondary | ICD-10-CM | POA: Diagnosis not present

## 2021-06-15 DIAGNOSIS — Z5189 Encounter for other specified aftercare: Secondary | ICD-10-CM | POA: Diagnosis not present

## 2021-06-15 DIAGNOSIS — Z5111 Encounter for antineoplastic chemotherapy: Secondary | ICD-10-CM | POA: Diagnosis not present

## 2021-06-15 LAB — CBC WITH DIFFERENTIAL (CANCER CENTER ONLY)
Abs Immature Granulocytes: 0.16 10*3/uL — ABNORMAL HIGH (ref 0.00–0.07)
Basophils Absolute: 0.1 10*3/uL (ref 0.0–0.1)
Basophils Relative: 1 %
Eosinophils Absolute: 0.1 10*3/uL (ref 0.0–0.5)
Eosinophils Relative: 1 %
HCT: 31.2 % — ABNORMAL LOW (ref 39.0–52.0)
Hemoglobin: 10.2 g/dL — ABNORMAL LOW (ref 13.0–17.0)
Immature Granulocytes: 2 %
Lymphocytes Relative: 8 %
Lymphs Abs: 0.7 10*3/uL (ref 0.7–4.0)
MCH: 27.6 pg (ref 26.0–34.0)
MCHC: 32.7 g/dL (ref 30.0–36.0)
MCV: 84.6 fL (ref 80.0–100.0)
Monocytes Absolute: 0.9 10*3/uL (ref 0.1–1.0)
Monocytes Relative: 10 %
Neutro Abs: 7 10*3/uL (ref 1.7–7.7)
Neutrophils Relative %: 78 %
Platelet Count: 217 10*3/uL (ref 150–400)
RBC: 3.69 MIL/uL — ABNORMAL LOW (ref 4.22–5.81)
RDW: 16.1 % — ABNORMAL HIGH (ref 11.5–15.5)
WBC Count: 8.8 10*3/uL (ref 4.0–10.5)
nRBC: 0 % (ref 0.0–0.2)

## 2021-06-15 LAB — CMP (CANCER CENTER ONLY)
ALT: 12 U/L (ref 0–44)
AST: 14 U/L — ABNORMAL LOW (ref 15–41)
Albumin: 2.5 g/dL — ABNORMAL LOW (ref 3.5–5.0)
Alkaline Phosphatase: 139 U/L — ABNORMAL HIGH (ref 38–126)
Anion gap: 12 (ref 5–15)
BUN: 25 mg/dL — ABNORMAL HIGH (ref 8–23)
CO2: 18 mmol/L — ABNORMAL LOW (ref 22–32)
Calcium: 9.3 mg/dL (ref 8.9–10.3)
Chloride: 103 mmol/L (ref 98–111)
Creatinine: 2.15 mg/dL — ABNORMAL HIGH (ref 0.61–1.24)
GFR, Estimated: 31 mL/min — ABNORMAL LOW (ref >60)
Glucose, Bld: 221 mg/dL — ABNORMAL HIGH (ref 70–99)
Potassium: 4.4 mmol/L (ref 3.5–5.1)
Sodium: 133 mmol/L — ABNORMAL LOW (ref 135–145)
Total Bilirubin: 0.7 mg/dL (ref 0.3–1.2)
Total Protein: 6.4 g/dL — ABNORMAL LOW (ref 6.5–8.1)

## 2021-06-15 MED ORDER — DOXYCYCLINE HYCLATE 100 MG PO TABS: 100.0000 mg | ORAL_TABLET | Freq: Every day | ORAL | 0 refills | Status: DC

## 2021-06-15 MED ORDER — SODIUM CHLORIDE 0.9% FLUSH
10.0000 mL | Freq: Once | INTRAVENOUS | Status: AC
  Administered 2021-06-15: 10 mL

## 2021-06-15 MED ORDER — PREDNISONE 20 MG PO TABS: ORAL_TABLET | ORAL | 0 refills | Status: DC

## 2021-06-15 NOTE — Assessment & Plan Note (Signed)
He has profound weight loss I will reduce the dose of his future treatment accordingly And hopefully the prednisone will improve his appetite

## 2021-06-15 NOTE — Assessment & Plan Note (Signed)
This is likely anemia of chronic disease. The patient denies recent history of bleeding such as epistaxis, hematuria or hematochezia. He is asymptomatic from the anemia. We will observe for now.  

## 2021-06-15 NOTE — Assessment & Plan Note (Signed)
Suspect he has lingering pulmonary infection I recommend prednisone for 10 days along with doxycycline for 10 days We will reduce the dose of doxycycline given his kidney function I will call him next week to check on him

## 2021-06-15 NOTE — Assessment & Plan Note (Signed)
He has intermittent chronic kidney disease We will continue reduced dose chemotherapy as before

## 2021-06-15 NOTE — Assessment & Plan Note (Signed)
I have reviewed multiple imaging studies with the patient He has excellent response to therapy The pulmonary changes is related to recent sequelae from pulmonary infection Due to significant symptoms of shortness of breath and persistent cough, I recommend delaying chemotherapy for 2 weeks We will focus on supportive care for now

## 2021-06-15 NOTE — Progress Notes (Signed)
[facility] OFFICE PROGRESS NOTE  Patient Care Team: Husain, Karrar, MD as PCP - General (Internal Medicine) Skains, Mark C, MD as PCP - Cardiology (Cardiology) Skains, Mark C, MD as Attending Physician (Cardiology)  ASSESSMENT & PLAN:  Diffuse large B cell lymphoma (HCC) I have reviewed multiple imaging studies with the patient He has excellent response to therapy The pulmonary changes is related to recent sequelae from pulmonary infection Due to significant symptoms of shortness of breath and persistent cough, I recommend delaying chemotherapy for 2 weeks We will focus on supportive care for now  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 The patient denies recent history of bleeding such as epistaxis, hematuria or hematochezia. He is asymptomatic from the anemia. We will observe for now.    Pulmonary infiltrate on radiologic exam Suspect he has lingering pulmonary infection I recommend prednisone for 10 days along with doxycycline for 10 days We will reduce the dose of doxycycline given his kidney function I will call him next week to check on him  Weight loss, non-intentional He has profound weight loss I will reduce the dose of his future treatment accordingly And hopefully the prednisone will improve his appetite  Chronic kidney disease (CKD), 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HCC) He has intermittent chronic kidney disease We will continue reduced dose chemotherapy as before  No orders of the defined types were placed in this encounter.   All questions were answered. The patient knows to call the clinic with any problems, questions or concerns. The total time spent in the appointment was 40 minutes encounter with patients including review of chart and various tests results, discussions about plan of care and coordination of care plan    , MD 06/15/2021 9:39 AM  INTERVAL HISTORY: Please see below for problem oriented charting. he  returns for treatment follow-up for cycle 4 of R-CHOP chemotherapy for diffuse large B-cell lymphoma Since last time I saw him, he was treated for COVID-19 infection He continues to have cough, shortness of breath on exertion and poor appetite He has lost tremendous amount of weight No new lymphadenopathy  REVIEW OF SYSTEMS:   Constitutional: Denies fevers, chills  Eyes: Denies blurriness of vision Gastrointestinal:  Denies nausea, heartburn or change in bowel habits Skin: Denies abnormal skin rashes Lymphatics: Denies new lymphadenopathy or easy bruising Neurological:Denies numbness, tingling or new weaknesses Behavioral/Psych: Mood is stable, no new changes  All other systems were reviewed with the patient and are negative.  I have reviewed the past medical history, past surgical history, social history and family history with the patient and they are unchanged from previous note.  ALLERGIES:  is allergic to lipitor [atorvastatin] and metformin.  MEDICATIONS:  Current Outpatient Medications  Medication Sig Dispense Refill   doxycycline (VIBRA-TABS) 100 MG tablet Take 1 tablet (100 mg total) by mouth daily. 10 tablet 0   predniSONE (DELTASONE) 20 MG tablet Take 40 mg daily for 10 days with breakfast 20 tablet 0   acyclovir (ZOVIRAX) 400 MG tablet TAKE 1 TABLET BY MOUTH EVERY DAY 90 tablet 1   allopurinol (ZYLOPRIM) 300 MG tablet TAKE 1 TABLET BY MOUTH EVERY DAY 90 tablet 1   amiodarone (PACERONE) 200 MG tablet Take 1 tablet (200 mg total) by mouth daily. 60 tablet 3   doxepin (SINEQUAN) 10 MG capsule Take 10 mg by mouth at bedtime.     glimepiride (AMARYL) 2 MG tablet Take 2 mg by mouth daily with breakfast.       glucose blood test strip Dispense glucometer, strips and lancets preferred by patient's insurance (DX: E11.9 / type 2 DM)     levothyroxine (SYNTHROID) 25 MCG tablet Take 25 mcg by mouth daily before breakfast.     lidocaine-prilocaine (EMLA) cream Apply to affected area once  30 g 3   Needle, Disp, (HYPODERMIC NEEDLE 26GX5/8") 26G X 5/8" MISC See admin instructions.     Omega-3 Fatty Acids (FISH OIL PO) Take 2 capsules by mouth daily.     ondansetron (ZOFRAN) 8 MG tablet Take 8 mg by mouth every 8 (eight) hours as needed.     predniSONE (DELTASONE) 20 MG tablet Take 2 tablets (40 mg total) by mouth daily. Take with food on for 4 days after chemotherapy every 3 weeks 8 tablet 5   prochlorperazine (COMPAZINE) 10 MG tablet Take 1 tablet (10 mg total) by mouth every 6 (six) hours as needed for nausea or vomiting. 30 tablet 0   rivaroxaban (XARELTO) 20 MG TABS tablet Take 1 tablet (20 mg total) by mouth daily with supper. 35 tablet 0   rosuvastatin (CRESTOR) 10 MG tablet Take 10 mg by mouth daily.     tamsulosin (FLOMAX) 0.4 MG CAPS capsule Take 0.4 mg by mouth daily.     Current Facility-Administered Medications  Medication Dose Route Frequency Provider Last Rate Last Admin   0.9 %  sodium chloride infusion   Intravenous PRN Husain, Karrar, MD        SUMMARY OF ONCOLOGIC HISTORY: Oncology History Overview Note  Normal FISH for CLL DLBCL transformed from CLL; c-myc by FISH and other rearrangements were not detected   Diffuse large B cell lymphoma (HCC)  09/10/2013 Initial Diagnosis   CLL (chronic lymphocytic leukemia)   12/26/2013 Pathology Results   FISH analysis was normal.   06/17/2014 Procedure   The patient has placement of Infuse-a-Port.   06/18/2014 Imaging   CT scan of the chest, abdomen and pelvis showed diffuse lymphadenopathy and splenomegaly.   06/19/2014 Bone Marrow Biopsy   Bone marrow aspirate and biopsy show CLL.   06/24/2014 - 11/18/2014 Chemotherapy   He received 6 cycles of Obinutuzumab and chlorambucil   09/15/2014 Imaging   Repeat CT scan of the chest, abdomen and pelvis show greater than 50% reduction in lymphadenopathy and splenomegaly   12/19/2014 Imaging   Repeat CT scan showed complete resolution of lymphadenopathy and splenomegaly.    04/22/2020 Imaging   CT neck 1. Cervical adenopathy involving the bilateral level 1B, right 2/3 and bilateral level 5 nodal stations. 2. Prominent to mildly enlarged upper mediastinal nodes. 3. Prominent subcentimeter right intraparotid node.     06/03/2020 -  Chemotherapy   The patient had rituximab for chemotherapy treatment.     08/31/2020 Imaging   CT neck 1. Regression of Leukemia. Resolved widespread cervical lymphadenopathy since August. Largest residual left level IIIb node now 8-9 mm short axis (previously 16 mm). 2.  CT Chest, Abdomen, and Pelvis today are reported separately.     08/31/2020 Imaging   Limited evaluation due to lack of intravenous contrast administration.   7.1 cm left perirenal soft tissue mass along the posterior left upper kidney, suspicious for perirenal lymphoma, although poorly evaluated. This is essentially new from May 2021.   Possible periureteral soft tissue along the right proximal collecting system/ureter, equivocal.   New 12 mm right lower lobe pulmonary nodule, suspicious for metastasis/pulmonary lymphoma. Additional 6 mm (mean diameter) subpleural nodule in the lingula is new from 2016, indeterminate.     No suspicious lymphadenopathy in the chest, abdomen, or pelvis. Spleen is normal in size.     02/05/2021 Imaging   1. Interval enlargement of multiple subcutaneous soft tissue nodules or lymph nodes, in the left axilla and overlying the sacrum and left buttock. 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 2. Significant interval increase in size of right lower lobe and lingular pulmonary nodules, now with large subpleural opacities, consistent with worsened pulmonary lymphomatous involvement. 3. Interval decrease in size of a left perinephric soft tissue mass.  4. Constellation of findings above suggest mixed response to treatment. 5. Mild pulmonary fibrosis in a pattern with apical to basal gradient, featuring irregular peripheral  interstitial opacity and septal thickening with some scattered areas of subpleural bronchiolectasis. No clear evidence of honeycombing. 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 Am J Respir Crit Care Med Vol 198, Iss 5, [id number], May 20 2017. 6. Coronary artery disease.   02/05/2021 Pathology Results   A. 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  The appearance is diffuse with lack of atypical follicles.  Flow cytometric analysis was attempted ([id number]) but there were insufficient cells present in the sample for analysis. Hence a battery of immunohistochemical stains was performed and shows that the atypical lymphoid cells are positive for CD20, CD79a, PAX5, BCL-2, and MUM 1.  Only scattered cells are positive for cytoplasmic kappa and not lambda.  There is patchy weak staining for BCL6.  No significant staining is seen with CD10, CD30, CD34, CD138, cyclin D1, or EBV in situ hybridization.  Ki-67 shows variable increased expression ranging from 30% to over 50% in some areas.  There is an admixed T-cell  population in the background to a lesser extent as seen with CD3 and CD5 and there is no apparent co-expression of CD5 in B-cell areas.  The findings are most consistent with involvement by large B-cell lymphoma,  ABC type.  Given the previous history of chronic lymphocytic leukemia/small lymphocytic lymphoma, this likely represents high-grade transformation.  Clinical  correlation is recommended   02/26/2021 PET scan   1. Evidence of multi organ lymphoma recurrence. 2. Enlarging foci of hypermetabolic peripheral consolidation in both lungs consistent pulmonary lymphoma. 3. Multiple foci of hypermetabolic subcutaneous nodularity consistent with subcutaneous lymphoma. 4. Solitary hypermetabolic nodal metastasis in the central mesentery and peritoneal implant along the RIGHT iliacus muscle. 5. THree foci of metabolic activity in the skeleton consistent with skeletal lymphoma.     03/08/2021 Procedure   Status post CT-guided biopsy of right lower lobe lung mass   03/12/2021 Cancer Staging   Staging form: Hodgkin and Non-Hodgkin Lymphoma, AJCC 7th Edition - Clinical stage from 03/12/2021: Stage IV - Signed by , , MD on 03/12/2021 Staged by: Managing physician Stage prefix: Recurrence Biopsy of metastatic site performed: Yes Source of metastatic specimen: Lung   03/17/2021 Echocardiogram    1. Left ventricular ejection fraction, by estimation, is 55 to 60%. The left ventricle has normal function. The left ventricle has no regional wall motion abnormalities. There is moderate concentric left ventricular hypertrophy. Left ventricular diastolic parameters   are indeterminate. The average left ventricular global longitudinal strain is -20.4 %. The global longitudinal strain is normal.  2. Right ventricular systolic function is normal. The right ventricular size is normal. Tricuspid regurgitation signal is inadequate for assessing PA pressure.  3. The mitral valve is grossly normal. Mild mitral valve regurgitation. No evidence of mitral stenosis.  4. The aortic valve is tricuspid. There is mild calcification of the aortic valve. Aortic valve regurgitation is not visualized. Mild aortic valve sclerosis is present, with no evidence of aortic valve stenosis.  5. The inferior vena cava is normal in size with greater than 50% respiratory variability, suggesting right  atrial pressure of 3 mmHg.   03/19/2021 Procedure   Successful placement of a right internal jugular approach power injectable Port-A-Cath. The catheter is ready for immediate use.     03/29/2021 -  Chemotherapy   Patient is on Treatment Plan : NON-HODGKINS LYMPHOMA R-CHOP q21d     06/10/2021 Echocardiogram    1. Left ventricular ejection fraction, by estimation, is 55 to 60%. The left ventricle has normal function. The left ventricle has no regional wall motion abnormalities. There is mild left ventricular hypertrophy. Left ventricular diastolic parameters are indeterminate.  2. Right ventricular systolic function is normal. The right ventricular size is normal.  3. The mitral valve is normal in structure. Trivial mitral valve regurgitation.  4. The aortic valve is normal in structure. Aortic valve regurgitation is not visualized. No aortic stenosis is present.   06/14/2021 PET scan   1. Significant interval positive response to therapy. Residual hypermetabolic peripheral right lower lobe lung mass, decreased in size and metabolism. Additional previously visualized hypermetabolic sites of involvement in the skeleton, subcutaneous and muscular soft tissues and mesenteric lymph nodes have resolved. 2. New patchy ground-glass opacity with associated patchy hypermetabolism throughout both lungs, nonspecific, differential includes drug reaction or atypical infection. Consider attention on follow-up chest CT. 3. Chronic findings include: Aortic Atherosclerosis (ICD10-I70.0). Paranasal sinusitis. Right nephrolithiasis. Marked diffuse colonic diverticulosis. Coronary atherosclerosis. 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 Recommend annual imaging followup by CTA or MRA.     PHYSICAL EXAMINATION: ECOG PERFORMANCE STATUS: 2 - Symptomatic, <50% confined to bed  Vitals:   06/15/21 0845  BP: 137/60  Pulse: 75  Resp: 20  Temp: (!) 97.5 F (36.4 C)  SpO2: 90%   Filed Weights   06/15/21 0845   Weight: 282 lb (127.9 kg)    GENERAL:alert, no distress and comfortable SKIN: skin color, texture, turgor are normal, no rashes or significant lesions EYES: normal, Conjunctiva are pink and non-injected, sclera clear OROPHARYNX:no exudate, no erythema and lips, buccal mucosa, and tongue normal  NECK: supple, thyroid normal size, non-tender, without nodularity LYMPH:  no palpable lymphadenopathy in the cervical, axillary or inguinal LUNGS: He has diffuse scattered wheezes bilaterally and crackles at the lung base HEART: regular rate & rhythm and no murmurs with mild bilateral lower extremity edema ABDOMEN:abdomen soft, non-tender and normal bowel sounds Musculoskeletal:no cyanosis of digits and no clubbing  NEURO: alert & oriented x 3 with fluent speech, no focal motor/sensory deficits  LABORATORY DATA:  I have reviewed the data as listed    Component Value Date/Time   NA 133 (L) 06/15/2021 0802   NA 138 10/25/2018 1120   NA 140 06/25/2015 0758   K 4.4 06/15/2021 0802   K 3.9 06/25/2015 0758   CL 103 06/15/2021 0802   CL 101 03/11/2013 1031   CO2 18 (L) 06/15/2021 0802   CO2   25 06/25/2015 0758   GLUCOSE 221 (H) 06/15/2021 0802   GLUCOSE 140 06/25/2015 0758   GLUCOSE 173 (H) 03/11/2013 1031   BUN 25 (H) 06/15/2021 0802   BUN 16 10/25/2018 1120   BUN 15.9 06/25/2015 0758   CREATININE 2.15 (H) 06/15/2021 0802   CREATININE 1.0 06/25/2015 0758   CALCIUM 9.3 06/15/2021 0802   CALCIUM 9.3 06/25/2015 0758   PROT 6.4 (L) 06/15/2021 0802   PROT 6.7 06/25/2015 0758   ALBUMIN 2.5 (L) 06/15/2021 0802   ALBUMIN 3.8 06/25/2015 0758   AST 14 (L) 06/15/2021 0802   AST 15 06/25/2015 0758   ALT 12 06/15/2021 0802   ALT 14 06/25/2015 0758   ALKPHOS 139 (H) 06/15/2021 0802   ALKPHOS 61 06/25/2015 0758   BILITOT 0.7 06/15/2021 0802   BILITOT 0.95 06/25/2015 0758   GFRNONAA 31 (L) 06/15/2021 0802   GFRAA 29 (L) 06/09/2020 0847   GFRAA 32 (L) 06/01/2020 0957    No results found for:  SPEP, UPEP  Lab Results  Component Value Date   WBC 8.8 06/15/2021   NEUTROABS 7.0 06/15/2021   HGB 10.2 (L) 06/15/2021   HCT 31.2 (L) 06/15/2021   MCV 84.6 06/15/2021   PLT 217 06/15/2021      Chemistry      Component Value Date/Time   NA 133 (L) 06/15/2021 0802   NA 138 10/25/2018 1120   NA 140 06/25/2015 0758   K 4.4 06/15/2021 0802   K 3.9 06/25/2015 0758   CL 103 06/15/2021 0802   CL 101 03/11/2013 1031   CO2 18 (L) 06/15/2021 0802   CO2 25 06/25/2015 0758   BUN 25 (H) 06/15/2021 0802   BUN 16 10/25/2018 1120   BUN 15.9 06/25/2015 0758   CREATININE 2.15 (H) 06/15/2021 0802   CREATININE 1.0 06/25/2015 0758      Component Value Date/Time   CALCIUM 9.3 06/15/2021 0802   CALCIUM 9.3 06/25/2015 0758   ALKPHOS 139 (H) 06/15/2021 0802   ALKPHOS 61 06/25/2015 0758   AST 14 (L) 06/15/2021 0802   AST 15 06/25/2015 0758   ALT 12 06/15/2021 0802   ALT 14 06/25/2015 0758   BILITOT 0.7 06/15/2021 0802   BILITOT 0.95 06/25/2015 0758       RADIOGRAPHIC STUDIES: I have reviewed multiple imaging studies with the patient and family I have personally reviewed the radiological images as listed and agreed with the findings in the report. DG Chest 2 View  Result Date: 05/18/2021 CLINICAL DATA:  Cough and dyspnea upon exertion. EXAM: CHEST - 2 VIEW COMPARISON:  April 19, 2021 FINDINGS: There is stable right-sided venous Port-A-Cath positioning. There is no evidence of acute infiltrate, pleural effusion or pneumothorax. Stable, ill-defined nodular opacities are seen within the lateral aspect of the right lung base. The cardiac silhouette is borderline in size and unchanged in appearance. The visualized skeletal structures are unremarkable. IMPRESSION: Stable exam without active cardiopulmonary disease. Electronically Signed   By: Virgina Norfolk M.D.   On: 05/18/2021 01:39   NM PET Image Restage (PS) Skull Base to Thigh (F-18 FDG)  Result Date: 06/14/2021 CLINICAL DATA:  Subsequent  treatment strategy for diffuse large B-cell lymphoma of lymph nodes of multiple regions, non-Hodgkin's lymphoma status post chemotherapy completed 05/29/2021. EXAM: NUCLEAR MEDICINE PET SKULL BASE TO THIGH TECHNIQUE: 13.9 mCi F-18 FDG was injected intravenously. Full-ring PET imaging was performed from the skull base to thigh after the radiotracer. CT data was obtained and used for attenuation correction  and anatomic localization. Fasting blood glucose: 219 mg/dl COMPARISON:  02/25/2021 PET-CT. FINDINGS: Mediastinal blood pool activity: SUV max 3.8 Liver activity: SUV max 4.8 NECK: No hypermetabolic lymph nodes in the neck. Incidental CT findings: Mucoperiosteal thickening throughout the ethmoidal air cells, frontal, sphenoid and maxillary sinuses. Right internal jugular Port-A-Cath terminates at the cavoatrial junction. CHEST: No enlarged or hypermetabolic axillary, mediastinal or hilar lymph nodes. Previous hypermetabolic subcutaneous lesions throughout the bilateral chest wall have resolved. Hypermetabolic 4.7 x 3.8 cm peripheral right lower lobe lung mass with max SUV 8.0 (series 8/image 54), decreased from 8.8 x 6.3 cm with previous max SUV 21.1. No discernible residual hypermetabolic pulmonary nodule in the lingula. New patchy ground-glass opacity throughout both lungs with associated patchy hypermetabolism throughout both lungs, for example max SUV 5.0 in the peripheral right upper lobe. No new discrete pulmonary nodules. Incidental CT findings: Coronary atherosclerosis. Atherosclerotic thoracic aorta with dilated 4.2 cm ascending thoracic aorta. ABDOMEN/PELVIS: No abnormal hypermetabolic activity within the liver, pancreas, adrenal glands, or spleen. Previously visualized hypermetabolic subcutaneous lesions throughout the abdomen and pelvis and previously visualized hypermetabolic lesion along the anterior margin of the right iliacus muscle have resolved. Central mesenteric 0.6 cm lymph node with max SUV 1.9  (series 4/image 153), decreased from 2.0 cm with previous max SUV 7.0. No pathologically enlarged or hypermetabolic lymph nodes in the abdomen or pelvis. Incidental CT findings: Cholecystectomy. Right renal pelvis 6 mm stone. Additional nonobstructing right renal stones up to the 7 mm in the lower right kidney. Stable asymmetric right renal atrophy. Atherosclerotic nonaneurysmal abdominal aorta. Mild prostatomegaly. Marked diffuse colonic diverticulosis. Surgical clips anterior to the right common femoral vessels. SKELETON: No focal hypermetabolic activity to suggest skeletal metastasis. Previously visualized hypermetabolic skeletal lesions in the left iliac, thoracic spine and right medial clavicular head have resolved. Incidental CT findings: none IMPRESSION: 1. Significant interval positive response to therapy. Residual hypermetabolic peripheral right lower lobe lung mass, decreased in size and metabolism. Additional previously visualized hypermetabolic sites of involvement in the skeleton, subcutaneous and muscular soft tissues and mesenteric lymph nodes have resolved. 2. New patchy ground-glass opacity with associated patchy hypermetabolism throughout both lungs, nonspecific, differential includes drug reaction or atypical infection. Consider attention on follow-up chest CT. 3. Chronic findings include: Aortic Atherosclerosis (ICD10-I70.0). Paranasal sinusitis. Right nephrolithiasis. Marked diffuse colonic diverticulosis. Coronary atherosclerosis. 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 Recommend annual imaging followup by CTA or MRA. This recommendation follows 2010 ACCF/AHA/AATS/ACR/ASA/SCA/SCAI/SIR/STS/SVM Guidelines for the Diagnosis and Management of Patients with Thoracic Aortic Disease. Circulation. 2010; 121: T700-F749. Aortic aneurysm NOS (ICD10-I71.9). Electronically Signed   By: Ilona Sorrel M.D.   On: 06/14/2021 07:47   ECHOCARDIOGRAM COMPLETE  Result Date: 06/07/2021    ECHOCARDIOGRAM REPORT    Patient Name:   JAWAUN CELMER Date of Exam: 06/07/2021 Medical Rec #:  [id number]   Height:       76.0 in Accession #:    [id number]  Weight:       295.4 lb Date of Birth:  1940/12/18   BSA:          2.615 m Patient Age:    50 years    BP:           137/80 mmHg Patient Gender: M           HR:           75 bpm. Exam Location:  Outpatient Procedure: 2D Echo, Cardiac Doppler, Color Doppler and Strain Analysis Indications:    Chemo  Z09  History:        Patient has prior history of Echocardiogram examinations, most                 recent 03/17/2021. Cardiomyopathy; Risk Factors:Diabetes and                 Hypertension.  Sonographer:    Bernadene Person RDCS Referring Phys: [id number] Israel Werts Krebs  1. Left ventricular ejection fraction, by estimation, is 55 to 60%. The left ventricle has normal function. The left ventricle has no regional wall motion abnormalities. There is mild left ventricular hypertrophy. Left ventricular diastolic parameters are indeterminate.  2. Right ventricular systolic function is normal. The right ventricular size is normal.  3. The mitral valve is normal in structure. Trivial mitral valve regurgitation.  4. The aortic valve is normal in structure. Aortic valve regurgitation is not visualized. No aortic stenosis is present. FINDINGS  Left Ventricle: Left ventricular ejection fraction, by estimation, is 55 to 60%. The left ventricle has normal function. The left ventricle has no regional wall motion abnormalities. The left ventricular internal cavity size was normal in size. There is  mild left ventricular hypertrophy. Left ventricular diastolic parameters are indeterminate. Right Ventricle: The right ventricular size is normal. Right vetricular wall thickness was not well visualized. Right ventricular systolic function is normal. Left Atrium: Left atrial size was normal in size. Right Atrium: Right atrial size was normal in size. Pericardium: There is no evidence of pericardial effusion. Mitral  Valve: The mitral valve is normal in structure. Trivial mitral valve regurgitation. Tricuspid Valve: The tricuspid valve is grossly normal. Tricuspid valve regurgitation is not demonstrated. Aortic Valve: The aortic valve is normal in structure. Aortic valve regurgitation is not visualized. No aortic stenosis is present. Pulmonic Valve: The pulmonic valve was grossly normal. Pulmonic valve regurgitation is not visualized. Aorta: The aortic root and ascending aorta are structurally normal, with no evidence of dilitation. IAS/Shunts: The atrial septum is grossly normal.  LEFT VENTRICLE PLAX 2D LVIDd:         4.90 cm  Diastology LVIDs:         3.10 cm  LV e' medial:    4.25 cm/s LV PW:         1.30 cm  LV E/e' medial:  17.3 LV IVS:        1.30 cm  LV e' lateral:   7.00 cm/s LVOT diam:     2.00 cm  LV E/e' lateral: 10.5 LV SV:         74 LV SV Index:   28 LVOT Area:     3.14 cm  RIGHT VENTRICLE RV S prime:     10.70 cm/s TAPSE (M-mode): 1.7 cm LEFT ATRIUM             Index       RIGHT ATRIUM           Index LA diam:        3.50 cm 1.34 cm/m  RA Area:     15.70 cm LA Vol (A2C):   38.5 ml 14.73 ml/m RA Volume:   35.10 ml  13.42 ml/m LA Vol (A4C):   37.5 ml 14.34 ml/m LA Biplane Vol: 40.1 ml 15.34 ml/m  AORTIC VALVE LVOT Vmax:   133.00 cm/s LVOT Vmean:  87.000 cm/s LVOT VTI:    0.234 m  AORTA Ao Root diam: 3.30 cm Ao Asc diam:  3.80 cm MITRAL VALVE MV Area (PHT): 2.99  cm    SHUNTS MV Decel Time: 254 msec    Systemic VTI:  0.23 m MV E velocity: 73.70 cm/s  Systemic Diam: 2.00 cm MV A velocity: 66.00 cm/s MV E/A ratio:  1.12 Philip Nahser MD Electronically signed by Philip Nahser MD Signature Date/Time: 06/07/2021/5:07:09 PM    Final     

## 2021-06-17 ENCOUNTER — Ambulatory Visit: Payer: Medicare Other

## 2021-06-21 ENCOUNTER — Telehealth: Payer: Self-pay

## 2021-06-21 NOTE — Telephone Encounter (Signed)
-----   Message from [name], MD sent at 06/21/2021  7:58 AM EDT ----- Pls call him/caregiver whether he is feeling better?

## 2021-06-21 NOTE — Telephone Encounter (Signed)
Called Filipe to see how is doing. He is feeling better. Still having shortness of breath with walking with some dizziness at times. The shortness of breath is better. Oxygen saturations usually are 88-89%. Today oxygen saturation is 92%. Cough is better and denies fever. He is ready to get treatment next week he stated.

## 2021-06-22 NOTE — Progress Notes (Signed)
Diagnosis: COVID  Provider:  Marshell Garfinkel, MD  Procedure: Infusion  IV Type: Peripheral, IV Location: R Hand  Bebtelovimab, Dose: 175 mg  Infusion Start Time: 8830  Infusion Stop Time: 1534  Post Infusion IV Care: Observation period completed and Peripheral IV Discontinued  Discharge: Condition: Good, Destination: Home . AVS provided to patient.   Performed by:  Jonelle Sidle, RN

## 2021-06-24 ENCOUNTER — Other Ambulatory Visit (HOSPITAL_COMMUNITY): Payer: Self-pay | Admitting: Physician Assistant

## 2021-06-24 DIAGNOSIS — I480 Paroxysmal atrial fibrillation: Secondary | ICD-10-CM

## 2021-06-28 MED FILL — Dexamethasone Sodium Phosphate Inj 100 MG/10ML: INTRAMUSCULAR | Qty: 1 | Status: AC

## 2021-06-28 MED FILL — Fosaprepitant Dimeglumine For IV Infusion 150 MG (Base Eq): INTRAVENOUS | Qty: 5 | Status: AC

## 2021-06-29 ENCOUNTER — Other Ambulatory Visit: Payer: Self-pay

## 2021-06-29 ENCOUNTER — Telehealth: Payer: Self-pay | Admitting: Emergency Medicine

## 2021-06-29 ENCOUNTER — Inpatient Hospital Stay: Payer: Medicare Other | Attending: Hematology and Oncology

## 2021-06-29 ENCOUNTER — Encounter: Payer: Self-pay | Admitting: Hematology and Oncology

## 2021-06-29 ENCOUNTER — Inpatient Hospital Stay: Payer: Medicare Other

## 2021-06-29 ENCOUNTER — Inpatient Hospital Stay (HOSPITAL_BASED_OUTPATIENT_CLINIC_OR_DEPARTMENT_OTHER): Payer: Medicare Other | Admitting: Hematology and Oncology

## 2021-06-29 DIAGNOSIS — E1122 Type 2 diabetes mellitus with diabetic chronic kidney disease: Secondary | ICD-10-CM | POA: Insufficient documentation

## 2021-06-29 DIAGNOSIS — J069 Acute upper respiratory infection, unspecified: Secondary | ICD-10-CM | POA: Diagnosis not present

## 2021-06-29 DIAGNOSIS — Z7901 Long term (current) use of anticoagulants: Secondary | ICD-10-CM | POA: Insufficient documentation

## 2021-06-29 DIAGNOSIS — R531 Weakness: Secondary | ICD-10-CM | POA: Diagnosis not present

## 2021-06-29 DIAGNOSIS — Z7189 Other specified counseling: Secondary | ICD-10-CM | POA: Diagnosis not present

## 2021-06-29 DIAGNOSIS — C8338 Diffuse large B-cell lymphoma, lymph nodes of multiple sites: Secondary | ICD-10-CM

## 2021-06-29 DIAGNOSIS — I700 Atherosclerosis of aorta: Secondary | ICD-10-CM | POA: Insufficient documentation

## 2021-06-29 DIAGNOSIS — R42 Dizziness and giddiness: Secondary | ICD-10-CM | POA: Diagnosis not present

## 2021-06-29 DIAGNOSIS — N400 Enlarged prostate without lower urinary tract symptoms: Secondary | ICD-10-CM | POA: Diagnosis not present

## 2021-06-29 DIAGNOSIS — N184 Chronic kidney disease, stage 4 (severe): Secondary | ICD-10-CM | POA: Insufficient documentation

## 2021-06-29 DIAGNOSIS — C9110 Chronic lymphocytic leukemia of B-cell type not having achieved remission: Secondary | ICD-10-CM

## 2021-06-29 DIAGNOSIS — R630 Anorexia: Secondary | ICD-10-CM | POA: Diagnosis not present

## 2021-06-29 DIAGNOSIS — Z9221 Personal history of antineoplastic chemotherapy: Secondary | ICD-10-CM | POA: Diagnosis not present

## 2021-06-29 DIAGNOSIS — Z7952 Long term (current) use of systemic steroids: Secondary | ICD-10-CM | POA: Insufficient documentation

## 2021-06-29 DIAGNOSIS — Z79899 Other long term (current) drug therapy: Secondary | ICD-10-CM | POA: Insufficient documentation

## 2021-06-29 DIAGNOSIS — J329 Chronic sinusitis, unspecified: Secondary | ICD-10-CM | POA: Diagnosis not present

## 2021-06-29 DIAGNOSIS — J441 Chronic obstructive pulmonary disease with (acute) exacerbation: Secondary | ICD-10-CM | POA: Insufficient documentation

## 2021-06-29 DIAGNOSIS — I2510 Atherosclerotic heart disease of native coronary artery without angina pectoris: Secondary | ICD-10-CM | POA: Diagnosis not present

## 2021-06-29 DIAGNOSIS — U071 COVID-19: Secondary | ICD-10-CM | POA: Diagnosis not present

## 2021-06-29 DIAGNOSIS — I129 Hypertensive chronic kidney disease with stage 1 through stage 4 chronic kidney disease, or unspecified chronic kidney disease: Secondary | ICD-10-CM | POA: Diagnosis not present

## 2021-06-29 DIAGNOSIS — K5730 Diverticulosis of large intestine without perforation or abscess without bleeding: Secondary | ICD-10-CM | POA: Diagnosis not present

## 2021-06-29 DIAGNOSIS — N200 Calculus of kidney: Secondary | ICD-10-CM | POA: Diagnosis not present

## 2021-06-29 LAB — CMP (CANCER CENTER ONLY)
ALT: 10 U/L (ref 0–44)
AST: 12 U/L — ABNORMAL LOW (ref 15–41)
Albumin: 3 g/dL — ABNORMAL LOW (ref 3.5–5.0)
Alkaline Phosphatase: 78 U/L (ref 38–126)
Anion gap: 11 (ref 5–15)
BUN: 30 mg/dL — ABNORMAL HIGH (ref 8–23)
CO2: 19 mmol/L — ABNORMAL LOW (ref 22–32)
Calcium: 9.2 mg/dL (ref 8.9–10.3)
Chloride: 105 mmol/L (ref 98–111)
Creatinine: 2.29 mg/dL — ABNORMAL HIGH (ref 0.61–1.24)
GFR, Estimated: 28 mL/min — ABNORMAL LOW (ref >60)
Glucose, Bld: 124 mg/dL — ABNORMAL HIGH (ref 70–99)
Potassium: 4.3 mmol/L (ref 3.5–5.1)
Sodium: 135 mmol/L (ref 135–145)
Total Bilirubin: 0.6 mg/dL (ref 0.3–1.2)
Total Protein: 6.1 g/dL — ABNORMAL LOW (ref 6.5–8.1)

## 2021-06-29 LAB — CBC WITH DIFFERENTIAL (CANCER CENTER ONLY)
Abs Immature Granulocytes: 0.21 10*3/uL — ABNORMAL HIGH (ref 0.00–0.07)
Basophils Absolute: 0 10*3/uL (ref 0.0–0.1)
Basophils Relative: 0 %
Eosinophils Absolute: 0.1 10*3/uL (ref 0.0–0.5)
Eosinophils Relative: 1 %
HCT: 34.8 % — ABNORMAL LOW (ref 39.0–52.0)
Hemoglobin: 11.4 g/dL — ABNORMAL LOW (ref 13.0–17.0)
Immature Granulocytes: 2 %
Lymphocytes Relative: 6 %
Lymphs Abs: 0.6 10*3/uL — ABNORMAL LOW (ref 0.7–4.0)
MCH: 28.1 pg (ref 26.0–34.0)
MCHC: 32.8 g/dL (ref 30.0–36.0)
MCV: 85.9 fL (ref 80.0–100.0)
Monocytes Absolute: 0.7 10*3/uL (ref 0.1–1.0)
Monocytes Relative: 7 %
Neutro Abs: 8.3 10*3/uL — ABNORMAL HIGH (ref 1.7–7.7)
Neutrophils Relative %: 84 %
Platelet Count: 160 10*3/uL (ref 150–400)
RBC: 4.05 MIL/uL — ABNORMAL LOW (ref 4.22–5.81)
RDW: 17.6 % — ABNORMAL HIGH (ref 11.5–15.5)
WBC Count: 10 10*3/uL (ref 4.0–10.5)
nRBC: 0 % (ref 0.0–0.2)

## 2021-06-29 MED ORDER — PREDNISONE 20 MG PO TABS: 20.0000 mg | ORAL_TABLET | Freq: Every day | ORAL | 0 refills | Status: DC

## 2021-06-29 MED ORDER — SODIUM CHLORIDE 0.9% FLUSH
10.0000 mL | Freq: Once | INTRAVENOUS | Status: AC
  Administered 2021-06-29: 10 mL

## 2021-06-29 NOTE — Progress Notes (Signed)
[place] OFFICE PROGRESS NOTE  Patient Care Team: Wenda Low, MD as PCP - General (Internal Medicine) Jerline Pain, MD as PCP - Cardiology (Cardiology) Jerline Pain, MD as Attending Physician (Cardiology)  ASSESSMENT & PLAN:  Diffuse large B cell lymphoma ([place]) Unfortunately, he is still not fully recovered from recent infection Overall, his performance status has declined His predominant problem is COPD exacerbation/shortness of breath on minimal exertion I recommend we continue to hold treatment today I will get him referred to see pulmonologist in the near future for evaluation and management I will prescribe a short course of prednisone therapy for him  Recent upper respiratory tract infection He is not fully recovered from recent lower respiratory tract infection Since his recent COVID 19 infection, he continues to have profound shortness of breath and cough He completed a course of doxycycline and prednisone recently with mild improvement On exam, he have diffuse wheezes I recommend another course of prednisone I warned him about risk of hyperglycemia I will refer him back to pulmonologist for evaluation and management  Chronic kidney disease (CKD), 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   All questions were answered. The patient knows to call the clinic with any problems, questions or concerns. The total time spent in the appointment was 30 minutes encounter with patients including review of chart and various tests results, discussions about plan of care and coordination of care plan   [name], MD 06/29/2021 3:52 PM  INTERVAL HISTORY: Please see below for problem oriented charting. he returns for treatment follow-up, to be seen prior to cycle 4 of chemotherapy with R-CHOP  chemotherapy for diffuse large B-cell lymphoma Since last time I saw him, he completed a course of doxycycline and prednisone He continues to have generalized weakness, dizziness, poor appetite, shortness of breath and cough Denies recent falls Due to his poor appetite, he has reduced oral intake but has not lost any weight He denies fever or chills  REVIEW OF SYSTEMS:   Constitutional: Denies fevers, chills or abnormal weight loss Eyes: Denies blurriness of vision Ears, nose, mouth, throat, and face: Denies mucositis or sore throat Cardiovascular: Denies palpitation, chest discomfort or lower extremity swelling Gastrointestinal:  Denies nausea, heartburn or change in bowel habits Skin: Denies abnormal skin rashes Lymphatics: Denies new lymphadenopathy or easy bruising Behavioral/Psych: Mood is stable, no new changes  All other systems were reviewed with the patient and are negative.  I have reviewed the past medical history, past surgical history, social history and family history with the patient and they are unchanged from previous note.  ALLERGIES:  is allergic to lipitor [atorvastatin] and metformin.  MEDICATIONS:  Current Outpatient Medications  Medication Sig Dispense Refill   acyclovir (ZOVIRAX) 400 MG tablet TAKE 1 TABLET BY MOUTH EVERY DAY 90 tablet 1   allopurinol (ZYLOPRIM) 300 MG tablet TAKE 1 TABLET BY MOUTH EVERY DAY 90 tablet 1   amiodarone (PACERONE) 200 MG tablet Take 1 tablet (200 mg total) by mouth daily. 60 tablet 3   doxepin (SINEQUAN) 10 MG capsule Take 10 mg by mouth at bedtime.     glimepiride (AMARYL) 2 MG tablet Take 2 mg by mouth daily with breakfast.     glucose blood test strip Dispense glucometer, strips and lancets preferred by patient's insurance (DX: E11.9 / type 2 DM)  levothyroxine (SYNTHROID) 25 MCG tablet Take 25 mcg by mouth daily before breakfast.     lidocaine-prilocaine (EMLA) cream Apply to affected area once 30 g 3   Needle, Disp,  (HYPODERMIC NEEDLE 26GX5/8") 26G X 5/8" MISC See admin instructions.     Omega-3 Fatty Acids (FISH OIL PO) Take 2 capsules by mouth daily.     ondansetron (ZOFRAN) 8 MG tablet Take 8 mg by mouth every 8 (eight) hours as needed.     predniSONE (DELTASONE) 20 MG tablet Take 1 tablet (20 mg total) by mouth daily with breakfast. 30 tablet 0   prochlorperazine (COMPAZINE) 10 MG tablet Take 1 tablet (10 mg total) by mouth every 6 (six) hours as needed for nausea or vomiting. 30 tablet 0   rosuvastatin (CRESTOR) 10 MG tablet Take 10 mg by mouth daily.     tamsulosin (FLOMAX) 0.4 MG CAPS capsule Take 0.4 mg by mouth daily.     XARELTO 20 MG TABS tablet TAKE 1 TABLET BY MOUTH DAILY WITH SUPPER. 90 tablet 1   Current Facility-Administered Medications  Medication Dose Route Frequency Provider Last Rate Last Admin   0.9 %  sodium chloride infusion   Intravenous PRN Wenda Low, MD        SUMMARY OF ONCOLOGIC HISTORY: Oncology History Overview Note  Normal FISH for CLL DLBCL transformed from CLL; c-myc by [facility] and other rearrangements were not detected   Diffuse large B cell lymphoma ([place])  09/10/2013 Initial Diagnosis   CLL (chronic lymphocytic leukemia)   12/26/2013 Pathology Results   FISH analysis was normal.   06/17/2014 Procedure   The patient has placement of Infuse-a-Port.   06/18/2014 Imaging   CT scan of the chest, abdomen and pelvis showed diffuse lymphadenopathy and splenomegaly.   06/19/2014 Bone Marrow Biopsy   Bone marrow aspirate and biopsy show CLL.   06/24/2014 - 11/18/2014 Chemotherapy   He received 6 cycles of Obinutuzumab and chlorambucil   09/15/2014 Imaging   Repeat CT scan of the chest, abdomen and pelvis show greater than 50% reduction in lymphadenopathy and splenomegaly   12/19/2014 Imaging   Repeat CT scan showed complete resolution of lymphadenopathy and splenomegaly.   04/22/2020 Imaging   CT neck 1. Cervical adenopathy involving the bilateral level 1B, right 2/3  and bilateral level 5 nodal stations. 2. Prominent to mildly enlarged upper mediastinal nodes. 3. Prominent subcentimeter right intraparotid node.     06/03/2020 -  Chemotherapy   The patient had rituximab for chemotherapy treatment.     08/31/2020 Imaging   CT neck 1. Regression of Leukemia. Resolved widespread cervical lymphadenopathy since August. Largest residual left level IIIb node now 8-9 mm short axis (previously 16 mm). 2.  CT Chest, Abdomen, and Pelvis today are reported separately.     08/31/2020 Imaging   Limited evaluation due to lack of intravenous contrast administration.   7.1 cm left perirenal soft tissue mass along the posterior left upper kidney, suspicious for perirenal lymphoma, although poorly evaluated. This is essentially new from May 2021.   Possible periureteral soft tissue along the right proximal collecting system/ureter, equivocal.   New 12 mm right lower lobe pulmonary nodule, suspicious for metastasis/pulmonary lymphoma. Additional 6 mm (mean diameter) subpleural nodule in the lingula is new from 2016, indeterminate.   No suspicious lymphadenopathy in the chest, abdomen, or pelvis. Spleen is normal in size.     02/05/2021 Imaging   1. Interval enlargement of multiple subcutaneous soft tissue nodules or lymph nodes, in the  left axilla and overlying the sacrum and left buttock. 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 2. Significant interval increase in size of right lower lobe and lingular pulmonary nodules, now with large subpleural opacities, consistent with worsened pulmonary lymphomatous involvement. 3. Interval decrease in size of a left perinephric soft tissue mass.  4. Constellation of findings above suggest mixed response to treatment. 5. 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 No  clear evidence of honeycombing. 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 [place], Iss 5, [phone], May 20 2017. 6. Coronary artery disease.   02/05/2021 Pathology Results   A. 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  The appearance is diffuse with lack of atypical follicles.  Flow cytometric analysis was attempted ([id number]) but there were insufficient cells present in the sample for analysis. Hence a battery of immunohistochemical stains was performed and shows that the atypical lymphoid cells are positive for CD20, CD79a, PAX5, BCL-2, and MUM 1.  Only scattered cells are positive for cytoplasmic kappa and not lambda.  There is patchy weak staining for BCL6.  No significant staining is seen with CD10, CD30, CD34, CD138, cyclin D1, or EBV in situ hybridization.  Ki-67 shows variable increased expression ranging from 30% to over 50% in some areas.  There is an admixed T-cell  population in the background to a lesser extent as seen with CD3 and CD5 and there is no apparent co-expression of CD5 in B-cell areas.  The findings are most consistent with involvement by large B-cell lymphoma,  ABC type.  Given the previous history of chronic lymphocytic leukemia/small lymphocytic lymphoma, this likely represents high-grade transformation.  Clinical correlation is recommended   02/26/2021 PET scan   1. Evidence of multi organ lymphoma recurrence. 2.  Enlarging foci of hypermetabolic peripheral consolidation in both lungs consistent pulmonary lymphoma. 3. Multiple foci of hypermetabolic subcutaneous nodularity consistent with subcutaneous lymphoma. 4. Solitary hypermetabolic nodal metastasis in the central mesentery and peritoneal implant along the RIGHT iliacus muscle. 5. THree foci of metabolic activity in the skeleton consistent with skeletal lymphoma.     03/08/2021 Procedure   Status post CT-guided biopsy of right lower lobe lung mass   03/12/2021 Cancer Staging   Staging form: Hodgkin and Non-Hodgkin Lymphoma, AJCC 7th Edition - Clinical stage from 03/12/2021: Stage IV - Signed by [name], MD on 03/12/2021 Staged by: Managing physician Stage prefix: Recurrence Biopsy of metastatic site performed: Yes Source of metastatic specimen: Lung   03/17/2021 Echocardiogram    1. Left ventricular ejection fraction, by estimation, is 55 to 60%. The left ventricle has normal function. The left ventricle has no regional wall motion abnormalities. There is moderate concentric left ventricular hypertrophy. Left ventricular diastolic parameters are indeterminate. The average left ventricular global longitudinal strain is -20.4 %. The global longitudinal strain is normal.  2. Right ventricular systolic function is normal. The right ventricular size is normal. Tricuspid regurgitation signal is  inadequate for assessing PA pressure.  3. The mitral valve is grossly normal. Mild mitral valve regurgitation. No evidence of mitral stenosis.  4. The aortic valve is tricuspid. There is mild calcification of the aortic valve. Aortic valve regurgitation is not visualized. Mild aortic valve sclerosis is present, with no evidence of aortic valve stenosis.  5. The inferior vena cava is normal in size with greater than 50% respiratory variability, suggesting right atrial pressure of 3 mmHg.   03/19/2021 Procedure   Successful placement of a right internal jugular  approach power injectable Port-A-Cath. The catheter is ready for immediate use.     03/29/2021 -  Chemotherapy   Patient is on Treatment Plan : NON-HODGKINS LYMPHOMA R-CHOP q21d     06/10/2021 Echocardiogram    1. Left ventricular ejection fraction, by estimation, is 55 to 60%. The left ventricle has normal function. The left ventricle has no regional wall motion abnormalities. There is mild left ventricular hypertrophy. Left ventricular diastolic parameters are indeterminate.  2. Right ventricular systolic function is normal. The right ventricular size is normal.  3. The mitral valve is normal in structure. Trivial mitral valve regurgitation.  4. The aortic valve is normal in structure. Aortic valve regurgitation is not visualized. No aortic stenosis is present.   06/14/2021 PET scan   1. Significant interval positive response to therapy. Residual hypermetabolic peripheral right lower lobe lung mass, decreased in size and metabolism. Additional previously visualized hypermetabolic sites of involvement in the skeleton, subcutaneous and muscular soft tissues and mesenteric lymph nodes have resolved. 2. New patchy ground-glass opacity with associated patchy hypermetabolism throughout both lungs, nonspecific, differential includes drug reaction or atypical infection. Consider attention on follow-up chest CT. 3. Chronic findings include: Aortic Atherosclerosis (ICD10-I70.0). Paranasal sinusitis. Right nephrolithiasis. Marked diffuse colonic diverticulosis. Coronary atherosclerosis. 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 Recommend annual imaging followup by CTA or MRA.     PHYSICAL EXAMINATION: ECOG PERFORMANCE STATUS: 2 - Symptomatic, <50% confined to bed  Vitals:   06/29/21 0823  BP: 127/66  Pulse: 79  Resp: 18  Temp: 99.4 F (37.4 C)  SpO2: 94%   Filed Weights   06/29/21 0823  Weight: 283 lb (128.4 kg)    GENERAL:alert, no distress and comfortable SKIN: Noted diffuse skin  bruises EYES: normal, Conjunctiva are pink and non-injected, sclera clear OROPHARYNX:no exudate, no erythema and lips, buccal mucosa, and tongue normal  NECK: supple, thyroid normal size, non-tender, without nodularity LYMPH:  no palpable lymphadenopathy in the cervical, axillary or inguinal LUNGS: Mild increased work of breathing.  Diffuse expiratory wheezes especially at the lung bases HEART: regular rate & rhythm and no murmurs and no lower extremity edema ABDOMEN:abdomen soft, non-tender and normal bowel sounds Musculoskeletal:no cyanosis of digits and no clubbing  NEURO: alert & oriented x 3 with fluent speech, no focal motor/sensory deficits  LABORATORY DATA:  I have reviewed the data as listed    Component Value Date/Time   NA 135 06/29/2021 0757   NA 138 10/25/2018 1120   NA 140 06/25/2015 0758   K 4.3 06/29/2021 0757   K 3.9 06/25/2015 0758   CL 105 06/29/2021 0757   CL 101 03/11/2013 1031   CO2 19 (L) 06/29/2021 0757   CO2 25 06/25/2015 0758   GLUCOSE 124 (H) 06/29/2021 0757   GLUCOSE 140 06/25/2015 0758   GLUCOSE 173 (H) 03/11/2013 1031   BUN 30 (H) 06/29/2021 0757   BUN 16 10/25/2018 1120   BUN 15.9 06/25/2015 0758  CREATININE 2.29 (H) 06/29/2021 0757   CREATININE 1.0 06/25/2015 0758   CALCIUM 9.2 06/29/2021 0757   CALCIUM 9.3 06/25/2015 0758   PROT 6.1 (L) 06/29/2021 0757   PROT 6.7 06/25/2015 0758   ALBUMIN 3.0 (L) 06/29/2021 0757   ALBUMIN 3.8 06/25/2015 0758   AST 12 (L) 06/29/2021 0757   AST 15 06/25/2015 0758   ALT 10 06/29/2021 0757   ALT 14 06/25/2015 0758   ALKPHOS 78 06/29/2021 0757   ALKPHOS 61 06/25/2015 0758   BILITOT 0.6 06/29/2021 0757   BILITOT 0.95 06/25/2015 0758   GFRNONAA 28 (L) 06/29/2021 0757   GFRAA 29 (L) 06/09/2020 0847   GFRAA 32 (L) 06/01/2020 0957    No results found for: SPEP, UPEP  Lab Results  Component Value Date   WBC 10.0 06/29/2021   NEUTROABS 8.3 (H) 06/29/2021   HGB 11.4 (L) 06/29/2021   HCT 34.8 (L)  06/29/2021   MCV 85.9 06/29/2021   PLT 160 06/29/2021      Chemistry      Component Value Date/Time   NA 135 06/29/2021 0757   NA 138 10/25/2018 1120   NA 140 06/25/2015 0758   K 4.3 06/29/2021 0757   K 3.9 06/25/2015 0758   CL 105 06/29/2021 0757   CL 101 03/11/2013 1031   CO2 19 (L) 06/29/2021 0757   CO2 25 06/25/2015 0758   BUN 30 (H) 06/29/2021 0757   BUN 16 10/25/2018 1120   BUN 15.9 06/25/2015 0758   CREATININE 2.29 (H) 06/29/2021 0757   CREATININE 1.0 06/25/2015 0758      Component Value Date/Time   CALCIUM 9.2 06/29/2021 0757   CALCIUM 9.3 06/25/2015 0758   ALKPHOS 78 06/29/2021 0757   ALKPHOS 61 06/25/2015 0758   AST 12 (L) 06/29/2021 0757   AST 15 06/25/2015 0758   ALT 10 06/29/2021 0757   ALT 14 06/25/2015 0758   BILITOT 0.6 06/29/2021 0757   BILITOT 0.95 06/25/2015 0758       RADIOGRAPHIC STUDIES: I have personally reviewed the radiological images as listed and agreed with the findings in the report. NM PET Image Restage (PS) Skull Base to Thigh (F-18 FDG)  Result Date: 06/14/2021 CLINICAL DATA:  Subsequent treatment strategy for diffuse large B-cell lymphoma of lymph nodes of multiple regions, non-Hodgkin's lymphoma status post chemotherapy completed 05/29/2021. EXAM: NUCLEAR MEDICINE PET SKULL BASE TO THIGH TECHNIQUE: 13.9 mCi F-18 FDG was injected intravenously. Full-ring PET imaging was performed from the skull base to thigh after the radiotracer. CT data was obtained and used for attenuation correction and anatomic localization. Fasting blood glucose: 219 mg/dl COMPARISON:  02/25/2021 PET-CT. FINDINGS: Mediastinal blood pool activity: SUV max 3.8 Liver activity: SUV max 4.8 NECK: No hypermetabolic lymph nodes in the neck. Incidental CT findings: Mucoperiosteal thickening throughout the ethmoidal air cells, frontal, sphenoid and maxillary sinuses. Right internal jugular Port-A-Cath terminates at the cavoatrial junction. CHEST: No enlarged or hypermetabolic  axillary, mediastinal or hilar lymph nodes. Previous hypermetabolic subcutaneous lesions throughout the bilateral chest wall have resolved. Hypermetabolic 4.7 x 3.8 cm peripheral right lower lobe lung mass with max SUV 8.0 (series 8/image 54), decreased from 8.8 x 6.3 cm with previous max SUV 21.1. No discernible residual hypermetabolic pulmonary nodule in the lingula. New patchy ground-glass opacity throughout both lungs with associated patchy hypermetabolism throughout both lungs, for example max SUV 5.0 in the peripheral right upper lobe. No new discrete pulmonary nodules. Incidental CT findings: Coronary atherosclerosis. Atherosclerotic thoracic aorta with dilated  4.2 cm ascending thoracic aorta. ABDOMEN/PELVIS: No abnormal hypermetabolic activity within the liver, pancreas, adrenal glands, or spleen. Previously visualized hypermetabolic subcutaneous lesions throughout the abdomen and pelvis and previously visualized hypermetabolic lesion along the anterior margin of the right iliacus muscle have resolved. Central mesenteric 0.6 cm lymph node with max SUV 1.9 (series 4/image 153), decreased from 2.0 cm with previous max SUV 7.0. No pathologically enlarged or hypermetabolic lymph nodes in the abdomen or pelvis. Incidental CT findings: Cholecystectomy. Right renal pelvis 6 mm stone. Additional nonobstructing right renal stones up to the 7 mm in the lower right kidney. Stable asymmetric right renal atrophy. Atherosclerotic nonaneurysmal abdominal aorta. Mild prostatomegaly. Marked diffuse colonic diverticulosis. Surgical clips anterior to the right common femoral vessels. SKELETON: No focal hypermetabolic activity to suggest skeletal metastasis. Previously visualized hypermetabolic skeletal lesions in the left iliac, thoracic spine and right medial clavicular head have resolved. Incidental CT findings: none IMPRESSION: 1. Significant interval positive response to therapy. Residual hypermetabolic peripheral right  lower lobe lung mass, decreased in size and metabolism. Additional previously visualized hypermetabolic sites of involvement in the skeleton, subcutaneous and muscular soft tissues and mesenteric lymph nodes have resolved. 2. New patchy ground-glass opacity with associated patchy hypermetabolism throughout both lungs, nonspecific, differential includes drug reaction or atypical infection. Consider attention on follow-up chest CT. 3. Chronic findings include: Aortic Atherosclerosis (ICD10-I70.0). Paranasal sinusitis. Right nephrolithiasis. Marked diffuse colonic diverticulosis. Coronary atherosclerosis. 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 Recommend annual imaging followup by CTA or MRA. This recommendation follows 2010 ACCF/AHA/AATS/ACR/ASA/SCA/SCAI/SIR/STS/SVM Guidelines for the Diagnosis and Management of Patients with Thoracic Aortic Disease. Circulation. 2010; 121: M010-U725. Aortic aneurysm NOS (ICD10-I71.9). Electronically Signed   By: Ilona Sorrel M.D.   On: 06/14/2021 07:47   ECHOCARDIOGRAM COMPLETE  Result Date: 06/07/2021    ECHOCARDIOGRAM REPORT   Patient Name:   Derek [name] Date of Exam: 06/07/2021 Medical Rec #:  [id number]   Height:       76.0 in Accession #:    [id number]  Weight:       295.4 lb Date of Birth:  05/13/1941   BSA:          2.615 m Patient Age:    57 years    BP:           137/80 mmHg Patient Gender: M           HR:           75 bpm. Exam Location:  Outpatient Procedure: 2D Echo, Cardiac Doppler, Color Doppler and Strain Analysis Indications:    Chemo Z09  History:        Patient has prior history of Echocardiogram examinations, most                 recent 03/17/2021. Cardiomyopathy; Risk Factors:Diabetes and                 Hypertension.  Sonographer:    Bernadene Person RDCS Referring Phys: [id number] Sekou Zuckerman [place]  1. Left ventricular ejection fraction, by estimation, is 55 to 60%. The left ventricle has normal function. The left ventricle has no regional wall motion  abnormalities. There is mild left ventricular hypertrophy. Left ventricular diastolic parameters are indeterminate.  2. Right ventricular systolic function is normal. The right ventricular size is normal.  3. The mitral valve is normal in structure. Trivial mitral valve regurgitation.  4. The aortic valve is normal in structure. Aortic valve regurgitation is not visualized. No aortic stenosis is present.  FINDINGS  Left Ventricle: Left ventricular ejection fraction, by estimation, is 55 to 60%. The left ventricle has normal function. The left ventricle has no regional wall motion abnormalities. The left ventricular internal cavity size was normal in size. There is  mild left ventricular hypertrophy. Left ventricular diastolic parameters are indeterminate. Right Ventricle: The right ventricular size is normal. Right vetricular wall thickness was not well visualized. Right ventricular systolic function is normal. Left Atrium: Left atrial size was normal in size. Right Atrium: Right atrial size was normal in size. Pericardium: There is no evidence of pericardial effusion. Mitral Valve: The mitral valve is normal in structure. Trivial mitral valve regurgitation. Tricuspid Valve: The tricuspid valve is grossly normal. Tricuspid valve regurgitation is not demonstrated. Aortic Valve: The aortic valve is normal in structure. Aortic valve regurgitation is not visualized. No aortic stenosis is present. Pulmonic Valve: The pulmonic valve was grossly normal. Pulmonic valve regurgitation is not visualized. Aorta: The aortic root and ascending aorta are structurally normal, with no evidence of dilitation. IAS/Shunts: The atrial septum is grossly normal.  LEFT VENTRICLE PLAX 2D LVIDd:         4.90 cm  Diastology LVIDs:         3.10 cm  LV e' medial:    4.25 cm/s LV PW:         1.30 cm  LV E/e' medial:  17.3 LV IVS:        1.30 cm  LV e' lateral:   7.00 cm/s LVOT diam:     2.00 cm  LV E/e' lateral: 10.5 LV SV:         74 LV SV  Index:   28 LVOT Area:     3.14 cm  RIGHT VENTRICLE RV S prime:     10.70 cm/s TAPSE (M-mode): 1.7 cm LEFT ATRIUM             Index       RIGHT ATRIUM           Index LA diam:        3.50 cm 1.34 cm/m  RA Area:     15.70 cm LA Vol (A2C):   38.5 ml 14.73 ml/m RA Volume:   35.10 ml  13.42 ml/m LA Vol (A4C):   37.5 ml 14.34 ml/m LA Biplane Vol: 40.1 ml 15.34 ml/m  AORTIC VALVE LVOT Vmax:   133.00 cm/s LVOT Vmean:  87.000 cm/s LVOT VTI:    0.234 m  AORTA Ao Root diam: 3.30 cm Ao Asc diam:  3.80 cm MITRAL VALVE MV Area (PHT): 2.99 cm    SHUNTS MV Decel Time: 254 msec    Systemic VTI:  0.23 m MV E velocity: 73.70 cm/s  Systemic Diam: 2.00 cm MV A velocity: 66.00 cm/s MV E/A ratio:  1.12 Mertie Moores MD Electronically signed by Mertie Moores MD Signature Date/Time: 06/07/2021/5:07:09 PM    Final

## 2021-06-29 NOTE — Assessment & Plan Note (Signed)
Unfortunately, he is still not fully recovered from recent infection Overall, his performance status has declined His predominant problem is COPD exacerbation/shortness of breath on minimal exertion I recommend we continue to hold treatment today I will get him referred to see pulmonologist in the near future for evaluation and management I will prescribe a short course of prednisone therapy for him

## 2021-06-29 NOTE — Assessment & Plan Note (Signed)
He is not fully recovered from recent lower respiratory tract infection Since his recent COVID 19 infection, he continues to have profound shortness of breath and cough He completed a course of doxycycline and prednisone recently with mild improvement On exam, he have diffuse wheezes I recommend another course of prednisone I warned him about risk of hyperglycemia I will refer him back to pulmonologist for evaluation and management

## 2021-06-29 NOTE — Assessment & Plan Note (Signed)
He has intermittent chronic kidney disease We discussed the importance of risk factor modification and adequate hydration

## 2021-07-01 ENCOUNTER — Ambulatory Visit: Payer: Medicare Other

## 2021-07-02 ENCOUNTER — Encounter: Payer: Self-pay | Admitting: Primary Care

## 2021-07-02 ENCOUNTER — Other Ambulatory Visit: Payer: Self-pay

## 2021-07-02 ENCOUNTER — Ambulatory Visit (INDEPENDENT_AMBULATORY_CARE_PROVIDER_SITE_OTHER): Payer: Medicare Other | Admitting: Primary Care

## 2021-07-02 ENCOUNTER — Telehealth: Payer: Self-pay | Admitting: Primary Care

## 2021-07-02 VITALS — BP 124/68 | HR 84 | Temp 98.0°F | Ht 76.0 in | Wt 280.4 lb

## 2021-07-02 DIAGNOSIS — R9389 Abnormal findings on diagnostic imaging of other specified body structures: Secondary | ICD-10-CM

## 2021-07-02 DIAGNOSIS — R0609 Other forms of dyspnea: Secondary | ICD-10-CM | POA: Diagnosis not present

## 2021-07-02 MED ORDER — FLUTICASONE FUROATE-VILANTEROL 100-25 MCG/INH IN AEPB: 1.0000 | INHALATION_SPRAY | Freq: Every day | RESPIRATORY_TRACT | 0 refills | Status: AC

## 2021-07-02 NOTE — Progress Notes (Signed)
@Patient  ID: Derek [name], male    DOB: 03-15-1941, 80 y.o.   MRN: [id number]  Chief Complaint  Patient presents with  . Follow-up    Pt. Says his lungs are very congested.    Referring provider: Wenda Low, MD  HPI: 80 year old male, former smoker quit 1990 (30-pack-year history). Past medical history significant for pulmonary infiltrate, A. fib, dilated cardiomyopathy, coronary artery disease, CKD stage IV, diffuse large B cell lymphoma. Patient of Dr. [name], last seen on 02/23/21.   Previous [facility] pulmonary encounter: 02/23/21- Dr. [name] 80 year old gentleman, former smoker (30 pack years) with a history of diabetes, hypertension, atrial flutter with a systolic dilated cardiomyopathy.  He is followed by Dr. Alvy Bimler for CLL diagnosed in 2014.  Last treated in April 2022 with chemotherapy and rituximab.  He has been followed with CT chest, abdomen, most recent 02/05/2021 as below.  It looks like he has a PET scan ordered for 02/25/2021.  Has albuterol, does not use. He does have some exertional SOB. He did use some albuterol also required O2 transiently when he had COVID 09/2018.   CT chest 02/05/2021 reviewed by me, shows some mild interstitial change with an apical to basal gradient and no clear honeycomb change.  He has bilateral pulmonary nodules and large subpleural opacities in the right lower lobe 7.9 x 4.1 cm, lingula 3.2 x 1.6 cm that are enlarging compared with prior.  Also with a newly enlarged left axillary node, multiple subcutaneous soft tissue nodules including overlying the sacrum, left buttock.  Abnormal CT of the chest With enlargement of pleural-based right lower lobe, pleural-based lingular nodules.  This despite decrease in size of his cervical lymphadenopathy while receiving chemotherapy and rituximab through April.  Suspect that this is some sort of lymphomatous conversion that failed to respond.  He has subcutaneous nodules as well noted on CT.  I agree that he needs biopsy  to determine tissue type.  Discussed possible bronchoscopy but I think transthoracic needle aspiration would probably be higher yield as it will give a core biopsy and give architecture.  I talked to him about the pros and cons of each procedure.  I also reviewed the case with Dr. Vernard Gambles with interventional radiology who agrees that needle biopsy is feasible.  The patient has a PET scan later this week and we will see if this scan modifies the plan or helps with biopsy targeting.  Get your PET scan as planned. Dr. [name] work on coordinating biopsy of 1 or both of your lung nodules Follow with Dr [name] in 1 month  DOE (dyspnea on exertion) He has dyspnea with exertion, probable mixed disease on prior pulmonary function testing but with a normal FEV1.  Unclear whether there is a significant component of COPD here but he is willing to do a trial of albuterol to see if he gets benefit.  If so then we could consider long-acting bronchodilator therapy.  He may benefit ultimately from repeat PFT   07/02/2021- Interim hx  Patient presents today for overduse follow-up/ acute visit. Since having covid infection he continues to have shortness of breath and cough. He first tested positive for covid 2 months ago.  Completed course of doxycyline and prednisone with mild improvement. He continues to have significant amount of shortness of breath with activity. Difficulty taking a deep breath. He has an occasional cough with some production. He has associated wheezing and chest tightness. He is on prednisone 20mg  daily prescribed on 10/11 by Dr. Alvy Bimler.  He is not on a maintenance inhaler.  PET scan in September 2022 showed interval positive response to therapy, residual hypermetabolic peripheral RLL mass decreased in size and metabolism. New patchy ground-glass opacity throughout both lungs with associated patchy hypermetabolism, SUV 5.0 peripheral RUL. Non specific, differential included drug reaction or atypical  infection. CT chest showed mild pulmonary fibrosis in May    Allergies  Allergen Reactions  . Lipitor [Atorvastatin] Rash  . Metformin Diarrhea    Immunization History  Administered Date(s) Administered  . Influenza Split 06/09/2009, 07/19/2011, 09/05/2012, 06/19/2014, 06/25/2015  . Influenza, High Dose Seasonal PF 06/07/2016, 06/12/2017, 07/04/2018, 05/31/2019  . Influenza,inj,Quad PF,6+ Mos 06/11/2014, 06/25/2015  . Influenza-Unspecified 07/21/2019  . PFIZER(Purple Top)SARS-COV-2 Vaccination 02/13/2020, 03/05/2020, 06/01/2020  . Pneumococcal Conjugate-13 07/09/2014  . Pneumococcal Polysaccharide-23 12/08/2009  . Td 09/09/2002, 12/18/2012  . Zoster Recombinat (Shingrix) 06/14/2019  . Zoster, Live 06/18/2012, 06/14/2019, 01/22/2020    Past Medical History:  Diagnosis Date  . Blood dyscrasia    cll remission  . CLL (chronic lymphocytic leukemia) ([place]) 09/10/2013  . Diabetes mellitus, type II ([place])   . Dilated cardiomyopathy ([name]) 09/14/2018   AFlutter >> EF 40-45 pre DCCV and 45-50 post DCCV in 08/2018 // probable tachycardia mediated.  Marland Kitchen GERD (gastroesophageal reflux disease)    occ tums  . H/O cardiac catheterization   . H/O exercise stress test 1999, 2002  . Hypertension   . Hypertriglyceridemia   . Lymphocytosis   . Obesity   . Pulmonary Function Test 09/2018   PFTs 09/2018:  FEV1 88% predicted; FEV1/FVC 78%; DLCO cor 58    Tobacco History: Social History   Tobacco Use  Smoking Status Former  . Packs/day: 3.00  . Years: 10.00  . Pack years: 30.00  . Types: Cigarettes  . Quit date: 09/19/1988  . Years since quitting: 32.8  Smokeless Tobacco Never   Counseling given: Not Answered   Outpatient Medications Prior to Visit  Medication Sig Dispense Refill  . acyclovir (ZOVIRAX) 400 MG tablet TAKE 1 TABLET BY MOUTH EVERY DAY 90 tablet 1  . allopurinol (ZYLOPRIM) 300 MG tablet TAKE 1 TABLET BY MOUTH EVERY DAY 90 tablet 1  . amiodarone (PACERONE) 200 MG tablet  Take 1 tablet (200 mg total) by mouth daily. 60 tablet 3  . doxepin (SINEQUAN) 10 MG capsule Take 10 mg by mouth at bedtime.    Marland Kitchen glimepiride (AMARYL) 2 MG tablet Take 2 mg by mouth daily with breakfast.    . glucose blood test strip Dispense glucometer, strips and lancets preferred by patient's insurance (DX: E11.9 / type 2 DM)    . levothyroxine (SYNTHROID) 25 MCG tablet Take 25 mcg by mouth daily before breakfast.    . lidocaine-prilocaine (EMLA) cream Apply to affected area once 30 g 3  . Needle, Disp, (HYPODERMIC NEEDLE 26GX5/8") 26G X 5/8" MISC See admin instructions.    . Omega-3 Fatty Acids (FISH OIL PO) Take 2 capsules by mouth daily.    . ondansetron (ZOFRAN) 8 MG tablet Take 8 mg by mouth every 8 (eight) hours as needed.    . predniSONE (DELTASONE) 20 MG tablet Take 1 tablet (20 mg total) by mouth daily with breakfast. 30 tablet 0  . prochlorperazine (COMPAZINE) 10 MG tablet Take 1 tablet (10 mg total) by mouth every 6 (six) hours as needed for nausea or vomiting. 30 tablet 0  . rosuvastatin (CRESTOR) 10 MG tablet Take 10 mg by mouth daily.    . tamsulosin (FLOMAX) 0.4 MG CAPS  capsule Take 0.4 mg by mouth daily.    Alveda Reasons 20 MG TABS tablet TAKE 1 TABLET BY MOUTH DAILY WITH SUPPER. 90 tablet 1   Facility-Administered Medications Prior to Visit  Medication Dose Route Frequency Provider Last Rate Last Admin  . 0.9 %  sodium chloride infusion   Intravenous PRN Wenda Low, MD          Review of Systems  Review of Systems  Constitutional: Negative.   HENT:  Positive for congestion.   Respiratory:  Positive for cough and shortness of breath. Negative for wheezing.     Physical Exam  BP 124/68 (BP Location: Left Arm, Patient Position: Sitting, Cuff Size: Normal)   Pulse 84   Temp 98 F (36.7 C) (Oral)   Ht 6\' 4"  (1.93 m)   Wt 280 lb 6.4 oz (127.2 kg)   SpO2 92%   BMI 34.13 kg/m  Physical Exam Constitutional:      Appearance: Normal appearance.  HENT:     Head:  Normocephalic and atraumatic.  Cardiovascular:     Rate and Rhythm: Normal rate and regular rhythm.     Comments: No significant rhonchi or wheezing  Pulmonary:     Effort: Pulmonary effort is normal.     Breath sounds: Normal breath sounds. No wheezing or rhonchi.  Musculoskeletal:        General: Normal range of motion.     Cervical back: Normal range of motion and neck supple.  Skin:    General: Skin is warm and dry.  Neurological:     General: No focal deficit present.     Mental Status: He is alert and oriented to person, place, and time. Mental status is at baseline.     Lab Results:  CBC    Component Value Date/Time   WBC 10.0 06/29/2021 0757   WBC 8.4 03/19/2021 1320   RBC 4.05 (L) 06/29/2021 0757   HGB 11.4 (L) 06/29/2021 0757   HGB 15.1 10/16/2018 1113   HGB 13.5 03/06/2017 1046   HCT 34.8 (L) 06/29/2021 0757   HCT 44.3 10/16/2018 1113   HCT 39.4 03/06/2017 1046   PLT 160 06/29/2021 0757   PLT 178 10/16/2018 1113   MCV 85.9 06/29/2021 0757   MCV 88 10/16/2018 1113   MCV 89.3 03/06/2017 1046   MCH 28.1 06/29/2021 0757   MCHC 32.8 06/29/2021 0757   RDW 17.6 (H) 06/29/2021 0757   RDW 14.2 10/16/2018 1113   RDW 15.1 (H) 03/06/2017 1046   LYMPHSABS 0.6 (L) 06/29/2021 0757   LYMPHSABS 1.4 09/04/2018 1036   LYMPHSABS 1.8 03/06/2017 1046   MONOABS 0.7 06/29/2021 0757   MONOABS 0.6 03/06/2017 1046   EOSABS 0.1 06/29/2021 0757   EOSABS 0.1 09/04/2018 1036   BASOSABS 0.0 06/29/2021 0757   BASOSABS 0.0 09/04/2018 1036   BASOSABS 0.1 03/06/2017 1046    BMET    Component Value Date/Time   NA 135 06/29/2021 0757   NA 138 10/25/2018 1120   NA 140 06/25/2015 0758   K 4.3 06/29/2021 0757   K 3.9 06/25/2015 0758   CL 105 06/29/2021 0757   CL 101 03/11/2013 1031   CO2 19 (L) 06/29/2021 0757   CO2 25 06/25/2015 0758   GLUCOSE 124 (H) 06/29/2021 0757   GLUCOSE 140 06/25/2015 0758   GLUCOSE 173 (H) 03/11/2013 1031   BUN 30 (H) 06/29/2021 0757   BUN 16  10/25/2018 1120   BUN 15.9 06/25/2015 0758   CREATININE 2.29 (H)  06/29/2021 0757   CREATININE 1.0 06/25/2015 0758   CALCIUM 9.2 06/29/2021 0757   CALCIUM 9.3 06/25/2015 0758   GFRNONAA 28 (L) 06/29/2021 0757   GFRAA 29 (L) 06/09/2020 0847   GFRAA 32 (L) 06/01/2020 0957    BNP No results found for: BNP  ProBNP No results found for: PROBNP  Imaging: NM PET Image Restage (PS) Skull Base to Thigh (F-18 FDG)  Result Date: 06/14/2021 CLINICAL DATA:  Subsequent treatment strategy for diffuse large B-cell lymphoma of lymph nodes of multiple regions, non-Hodgkin's lymphoma status post chemotherapy completed 05/29/2021. EXAM: NUCLEAR MEDICINE PET SKULL BASE TO THIGH TECHNIQUE: 13.9 mCi F-18 FDG was injected intravenously. Full-ring PET imaging was performed from the skull base to thigh after the radiotracer. CT data was obtained and used for attenuation correction and anatomic localization. Fasting blood glucose: 219 mg/dl COMPARISON:  02/25/2021 PET-CT. FINDINGS: Mediastinal blood pool activity: SUV max 3.8 Liver activity: SUV max 4.8 NECK: No hypermetabolic lymph nodes in the neck. Incidental CT findings: Mucoperiosteal thickening throughout the ethmoidal air cells, frontal, sphenoid and maxillary sinuses. Right internal jugular Port-A-Cath terminates at the cavoatrial junction. CHEST: No enlarged or hypermetabolic axillary, mediastinal or hilar lymph nodes. Previous hypermetabolic subcutaneous lesions throughout the bilateral chest wall have resolved. Hypermetabolic 4.7 x 3.8 cm peripheral right lower lobe lung mass with max SUV 8.0 (series 8/image 54), decreased from 8.8 x 6.3 cm with previous max SUV 21.1. No discernible residual hypermetabolic pulmonary nodule in the lingula. New patchy ground-glass opacity throughout both lungs with associated patchy hypermetabolism throughout both lungs, for example max SUV 5.0 in the peripheral right upper lobe. No new discrete pulmonary nodules. Incidental CT  findings: Coronary atherosclerosis. Atherosclerotic thoracic aorta with dilated 4.2 cm ascending thoracic aorta. ABDOMEN/PELVIS: No abnormal hypermetabolic activity within the liver, pancreas, adrenal glands, or spleen. Previously visualized hypermetabolic subcutaneous lesions throughout the abdomen and pelvis and previously visualized hypermetabolic lesion along the anterior margin of the right iliacus muscle have resolved. Central mesenteric 0.6 cm lymph node with max SUV 1.9 (series 4/image 153), decreased from 2.0 cm with previous max SUV 7.0. No pathologically enlarged or hypermetabolic lymph nodes in the abdomen or pelvis. Incidental CT findings: Cholecystectomy. Right renal pelvis 6 mm stone. Additional nonobstructing right renal stones up to the 7 mm in the lower right kidney. Stable asymmetric right renal atrophy. Atherosclerotic nonaneurysmal abdominal aorta. Mild prostatomegaly. Marked diffuse colonic diverticulosis. Surgical clips anterior to the right common femoral vessels. SKELETON: No focal hypermetabolic activity to suggest skeletal metastasis. Previously visualized hypermetabolic skeletal lesions in the left iliac, thoracic spine and right medial clavicular head have resolved. Incidental CT findings: none IMPRESSION: 1. Significant interval positive response to therapy. Residual hypermetabolic peripheral right lower lobe lung mass, decreased in size and metabolism. Additional previously visualized hypermetabolic sites of involvement in the skeleton, subcutaneous and muscular soft tissues and mesenteric lymph nodes have resolved. 2. New patchy ground-glass opacity with associated patchy hypermetabolism throughout both lungs, nonspecific, differential includes drug reaction or atypical infection. Consider attention on follow-up chest CT. 3. Chronic findings include: Aortic Atherosclerosis (ICD10-I70.0). Paranasal sinusitis. Right nephrolithiasis. Marked diffuse colonic diverticulosis. Coronary  atherosclerosis. 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 Recommend annual imaging followup by CTA or MRA. This recommendation follows 2010 ACCF/AHA/AATS/ACR/ASA/SCA/SCAI/SIR/STS/SVM Guidelines for the Diagnosis and Management of Patients with Thoracic Aortic Disease. Circulation. 2010; 121: K742-V956. Aortic aneurysm NOS (ICD10-I71.9). Electronically Signed   By: Ilona Sorrel M.D.   On: 06/14/2021 07:47   ECHOCARDIOGRAM COMPLETE  Result Date: 06/07/2021  ECHOCARDIOGRAM REPORT   Patient Name:   DAEVION NAVARETTE Date of Exam: 06/07/2021 Medical Rec #:  [id number]   Height:       76.0 in Accession #:    [id number]  Weight:       295.4 lb Date of Birth:  03-20-1941   BSA:          2.615 m Patient Age:    41 years    BP:           137/80 mmHg Patient Gender: M           HR:           75 bpm. Exam Location:  Outpatient Procedure: 2D Echo, Cardiac Doppler, Color Doppler and Strain Analysis Indications:    Chemo Z09  History:        Patient has prior history of Echocardiogram examinations, most                 recent 03/17/2021. Cardiomyopathy; Risk Factors:Diabetes and                 Hypertension.  Sonographer:    Bernadene Person RDCS Referring Phys: [id number] NI [place]  1. Left ventricular ejection fraction, by estimation, is 55 to 60%. The left ventricle has normal function. The left ventricle has no regional wall motion abnormalities. There is mild left ventricular hypertrophy. Left ventricular diastolic parameters are indeterminate.  2. Right ventricular systolic function is normal. The right ventricular size is normal.  3. The mitral valve is normal in structure. Trivial mitral valve regurgitation.  4. The aortic valve is normal in structure. Aortic valve regurgitation is not visualized. No aortic stenosis is present. FINDINGS  Left Ventricle: Left ventricular ejection fraction, by estimation, is 55 to 60%. The left ventricle has normal function. The left ventricle has no regional wall motion abnormalities.  The left ventricular internal cavity size was normal in size. There is  mild left ventricular hypertrophy. Left ventricular diastolic parameters are indeterminate. Right Ventricle: The right ventricular size is normal. Right vetricular wall thickness was not well visualized. Right ventricular systolic function is normal. Left Atrium: Left atrial size was normal in size. Right Atrium: Right atrial size was normal in size. Pericardium: There is no evidence of pericardial effusion. Mitral Valve: The mitral valve is normal in structure. Trivial mitral valve regurgitation. Tricuspid Valve: The tricuspid valve is grossly normal. Tricuspid valve regurgitation is not demonstrated. Aortic Valve: The aortic valve is normal in structure. Aortic valve regurgitation is not visualized. No aortic stenosis is present. Pulmonic Valve: The pulmonic valve was grossly normal. Pulmonic valve regurgitation is not visualized. Aorta: The aortic root and ascending aorta are structurally normal, with no evidence of dilitation. IAS/Shunts: The atrial septum is grossly normal.  LEFT VENTRICLE PLAX 2D LVIDd:         4.90 cm  Diastology LVIDs:         3.10 cm  LV e' medial:    4.25 cm/s LV PW:         1.30 cm  LV E/e' medial:  17.3 LV IVS:        1.30 cm  LV e' lateral:   7.00 cm/s LVOT diam:     2.00 cm  LV E/e' lateral: 10.5 LV SV:         74 LV SV Index:   28 LVOT Area:     3.14 cm  RIGHT VENTRICLE RV S prime:     10.70 cm/s  TAPSE (M-mode): 1.7 cm LEFT ATRIUM             Index       RIGHT ATRIUM           Index LA diam:        3.50 cm 1.34 cm/m  RA Area:     15.70 cm LA Vol (A2C):   38.5 ml 14.73 ml/m RA Volume:   35.10 ml  13.42 ml/m LA Vol (A4C):   37.5 ml 14.34 ml/m LA Biplane Vol: 40.1 ml 15.34 ml/m  AORTIC VALVE LVOT Vmax:   133.00 cm/s LVOT Vmean:  87.000 cm/s LVOT VTI:    0.234 m  AORTA Ao Root diam: 3.30 cm Ao Asc diam:  3.80 cm MITRAL VALVE MV Area (PHT): 2.99 cm    SHUNTS MV Decel Time: 254 msec    Systemic VTI:  0.23 m MV E  velocity: 73.70 cm/s  Systemic Diam: 2.00 cm MV A velocity: 66.00 cm/s MV E/A ratio:  1.12 Mertie Moores MD Electronically signed by Mertie Moores MD Signature Date/Time: 06/07/2021/5:07:09 PM    Final      Assessment & Plan:   DOE (dyspnea on exertion) - Former smoker quit in 1990 (30 pack year hx). He had covid in January 2020 and again in summer 2022. Patient has been having increased shortness of breath with exertion over the last two months. PFTs in 2020 showed probable mixed disease but will normal FEV1. Trial BREO 157mcg one puff daily. Continue 20mg  prednisone daily until follow-up, hopefully can taper off this. Needs repeat PFTs. FU in November with Dr. [name]   Abnormal CT of the chest - CT chest in May 2022 showed mild pulmonary fibrosis. PET scan completed in September showed  positive response to therapy, residual hypermetabolic peripheral RLL mass decreased in size and metabolism. New patchy ground-glass opacity throughout both lungs with associated patchy hypermetabolism, SUV 5.0 peripheral RUL. Non specific, differential included drug reaction or atypical infection. He will see Dr. [name] in 3 weeks to review. Until then recommend he take mucinex twice daily.    Derek Ehrich, NP 07/02/2021

## 2021-07-02 NOTE — Patient Instructions (Addendum)
Recommendations: Start BREO 100- take one puff every day in the morning You can use Albuterol rescue inhaler/nebulizer every 4-6 hours as needed for breakthrough sob Use incentive spirometer every hour while awake Take mucinex 600mg  twice a day with full glass of water   Orders: Incentive spirometer   Follow-up Nov 2nd with Dr. [name] at 9:15

## 2021-07-02 NOTE — Telephone Encounter (Signed)
Next avail day that we are able to get pt to have PFT prior to OV the same day isn't until 12/14.  If PFT has to be done separate day from a f/u with RB, first avail day for PFT (if pt is okay with a 4pm appt) is 11/7, then the next avail day after that for OV with RB would be able to happen on 11/8.  If pt needs to have an earlier time than 4pm for PFT, next avail day would be 11/16 with pt having OV after with RB on 11/18 as we can use a lung nodule slot for pt due to his lung mass diagnosis.  Beth, please advise if you were wanting pt to have PFT and OV on same day or if you would be okay with this being on two separate days if pt is okay having them on two separate days.

## 2021-07-02 NOTE — Telephone Encounter (Signed)
Does not need to be same day. I am ok with them doing two separate days as long as it is done before his visit with [facility]

## 2021-07-02 NOTE — Assessment & Plan Note (Addendum)
-   CT chest in May 2022 showed mild pulmonary fibrosis. PET scan completed in September showed  positive response to therapy, residual hypermetabolic peripheral RLL mass decreased in size and metabolism. New patchy ground-glass opacity throughout both lungs with associated patchy hypermetabolism, SUV 5.0 peripheral RUL. Non specific, differential included drug reaction or atypical infection. He will see Dr. [name] in 3 weeks to review. Until then recommend he take mucinex twice daily.

## 2021-07-02 NOTE — Assessment & Plan Note (Addendum)
-   Former smoker quit in [place] (30 pack year hx). He had covid in January 2020 and again in summer 2022. Patient has been having increased shortness of breath with exertion over the last two months. PFTs in 2020 showed probable mixed disease but will normal FEV1. Trial BREO 173mcg one puff daily. Continue 20mg  prednisone daily until follow-up, hopefully can taper off this. Needs repeat PFTs. FU in November with Dr. [name]

## 2021-07-02 NOTE — Addendum Note (Signed)
Addended by: Martyn Ehrich on: 07/02/2021 10:45 AM   Modules accepted: Orders

## 2021-07-02 NOTE — Telephone Encounter (Signed)
Patient needs PFTs prior to his visit on Nov 2nd with Dr. [name]- if we need to move visit to Nov 4th or 8th to do that, that is ok. Please help set up. Thanks

## 2021-07-05 ENCOUNTER — Telehealth: Payer: Self-pay

## 2021-07-05 NOTE — Telephone Encounter (Signed)
Called and spoke with pt letting him know that BW wanted him to have PFT prior to f/u with RB and he verbalized understanding. PFT appt has been scheduled for pt 10/28 and pt has been told to bring Covid vaccine card.nothing further needed.

## 2021-07-05 NOTE — Telephone Encounter (Signed)
-----   Message from [name], MD sent at 07/05/2021  8:31 AM EDT ----- Can you call him and ask how is his breathing?

## 2021-07-05 NOTE — Telephone Encounter (Signed)
Called and given below message. He verbalized understanding. Still complaining of shortness of breath with exertion. Cough may be better, he is not sure. He is doing a home covid test at times and still testing positive. He appreciated the call.

## 2021-07-07 NOTE — Telephone Encounter (Signed)
Entered in error. Will close encounter.  

## 2021-07-16 ENCOUNTER — Ambulatory Visit (INDEPENDENT_AMBULATORY_CARE_PROVIDER_SITE_OTHER): Payer: Medicare Other | Admitting: Emergency Medicine

## 2021-07-16 ENCOUNTER — Other Ambulatory Visit: Payer: Self-pay

## 2021-07-16 DIAGNOSIS — R0609 Other forms of dyspnea: Secondary | ICD-10-CM | POA: Diagnosis not present

## 2021-07-16 LAB — PULMONARY FUNCTION TEST
DL/VA % pred: 59 %
DL/VA: 2.28 ml/min/mmHg/L
DLCO cor % pred: 43 %
DLCO cor: 12.44 ml/min/mmHg
DLCO unc % pred: 39 %
DLCO unc: 11.15 ml/min/mmHg
RV % pred: 87 %
RV: 2.56 L
TLC % pred: 77 %
TLC: 6.24 L

## 2021-07-16 NOTE — Progress Notes (Signed)
DLCO and Pleth completed on pt today. Pre and post spirometry could not be completed r/t cough

## 2021-07-21 ENCOUNTER — Other Ambulatory Visit: Payer: Self-pay

## 2021-07-21 ENCOUNTER — Encounter: Payer: Self-pay | Admitting: Emergency Medicine

## 2021-07-21 ENCOUNTER — Ambulatory Visit (INDEPENDENT_AMBULATORY_CARE_PROVIDER_SITE_OTHER): Payer: Medicare Other | Admitting: Emergency Medicine

## 2021-07-21 VITALS — BP 122/82 | HR 65 | Temp 97.7°F | Ht 76.0 in | Wt 277.0 lb

## 2021-07-21 DIAGNOSIS — J189 Pneumonia, unspecified organism: Secondary | ICD-10-CM

## 2021-07-21 DIAGNOSIS — R918 Other nonspecific abnormal finding of lung field: Secondary | ICD-10-CM | POA: Diagnosis not present

## 2021-07-21 DIAGNOSIS — J449 Chronic obstructive pulmonary disease, unspecified: Secondary | ICD-10-CM | POA: Diagnosis not present

## 2021-07-21 DIAGNOSIS — J984 Other disorders of lung: Secondary | ICD-10-CM

## 2021-07-21 MED ORDER — PREDNISONE 10 MG PO TABS: 10.0000 mg | ORAL_TABLET | Freq: Every day | ORAL | 0 refills | Status: AC

## 2021-07-21 MED ORDER — BREZTRI AEROSPHERE 160-9-4.8 MCG/ACT IN AERO: 2.0000 | INHALATION_SPRAY | Freq: Two times a day (BID) | RESPIRATORY_TRACT | 0 refills | Status: AC

## 2021-07-21 NOTE — Progress Notes (Signed)
Subjective:    Patient ID: Derek [name], male    DOB: [birth year], 80 y.o.   MRN: [id number]  HPI 80 year old gentleman, former smoker (30 pack years) with a history of diabetes, hypertension, atrial flutter with a systolic dilated cardiomyopathy.  He is followed by Dr. Alvy Bimler for CLL diagnosed in 2014.  Last treated in April 2022 with chemotherapy and rituximab.  He has been followed with CT chest, abdomen, most recent 02/05/2021 as below.  It looks like he has a PET scan ordered for 02/25/2021.  Has albuterol, does not use. He does have some exertional SOB. He did use some albuterol also required O2 transiently when he had COVID 09/2018.   CT chest 02/05/2021 reviewed by me, shows some mild interstitial change with an apical to basal gradient and no clear honeycomb change.  He has bilateral pulmonary nodules and large subpleural opacities in the right lower lobe 7.9 x 4.1 cm, lingula 3.2 x 1.6 cm that are enlarging compared with prior.  Also with a newly enlarged left axillary node, multiple subcutaneous soft tissue nodules including overlying the sacrum, left buttock.    ROV 07/21/21 --follow-up visit 80 year old man, former smoker with a flutter, dilated cardiomyopathy and systolic CHF, diabetes.  He is followed for CLL diagnosed in 2014.  I saw him in June for some enlargement of pleural-based right lower lobe and lingular nodules (on chemotherapy and rituximab per Dr. Alvy Bimler).  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 He had COVID in August has had persistent dyspnea, was treated with prednisone, doxycycline.  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  He was given a trial of Breo in mid October. His breathing is getting better but not back to baseline. He has a lot cough, rarely productive, no hemoptysis.   PET scan  06/14/2022 reviewed by me, showed a significant interval positive response to treatment with some residual hypermetabolic peripheral right lower lobe mass decreased in size.  There was a new patchy groundglass opacity with some hypermetabolism noted throughout both lungs that was nonspecific, question drug reaction versus atypical infection  Pulmonary function testing 07/16/2021 reviewed by me.  He is unable to do spirometry due to coughing.  Decreased TLC consistent with restriction.  Severely decreased diffusion capacity.   Review of Systems As per HPI   Past Medical History:  Diagnosis Date   Blood dyscrasia    cll remission   CLL (chronic lymphocytic leukemia) ([place]) 09/10/2013   Diabetes mellitus, type II ([place])    Dilated cardiomyopathy ([place]) 09/14/2018   AFlutter >> EF 40-45 pre DCCV and 45-50 post DCCV in 08/2018 // probable tachycardia mediated.   GERD (gastroesophageal reflux disease)    occ tums   H/O cardiac catheterization    H/O exercise stress test 1999, 2002   Hypertension    Hypertriglyceridemia    Lymphocytosis    Obesity    Pulmonary Function Test 09/2018   PFTs 09/2018:  FEV1 88% predicted; FEV1/FVC 78%; DLCO cor 5     Family History  Problem Relation Age of Onset   Heart attack Mother    Cancer Father        liver     Social History   Socioeconomic History   Marital status: Widowed    Spouse name: Not on file   Number of children: 3   Years of education:  Not on file   Highest education level: Not on file  Occupational History    Employer: [employer]    Comment: retied sale; asphalt work   Tobacco Use   Smoking status: Former    Packs/day: 3.00    Years: 10.00    Pack years: 30.00    Types: Cigarettes    Quit date: 09/19/1988    Years since quitting: 32.8   Smokeless tobacco: Never  Vaping Use   Vaping Use: Never used  Substance and Sexual Activity   Alcohol use: Yes    Alcohol/week: 17.0 - 19.0 standard drinks    Types: 14 Shots of liquor,  3 - 5 Standard drinks or equivalent per week   Drug use: No   Sexual activity: Not on file  Other Topics Concern   Not on file  Social History Narrative   Not on file   Social Determinants of Health   Financial Resource Strain: Not on file  Food Insecurity: Not on file  Transportation Needs: Not on file  Physical Activity: Not on file  Stress: Not on file  Social Connections: Not on file  Intimate Partner Violence: Not on file     Allergies  Allergen Reactions   Lipitor [Atorvastatin] Rash   Metformin Diarrhea     Outpatient Medications Prior to Visit  Medication Sig Dispense Refill   acyclovir (ZOVIRAX) 400 MG tablet TAKE 1 TABLET BY MOUTH EVERY DAY 90 tablet 1   allopurinol (ZYLOPRIM) 300 MG tablet TAKE 1 TABLET BY MOUTH EVERY DAY 90 tablet 1   amiodarone (PACERONE) 200 MG tablet Take 1 tablet (200 mg total) by mouth daily. 60 tablet 3   doxepin (SINEQUAN) 10 MG capsule Take 10 mg by mouth at bedtime.     fluticasone furoate-vilanterol (BREO ELLIPTA) 100-25 MCG/INH AEPB Inhale 1 puff into the lungs daily. 60 each 0   glimepiride (AMARYL) 2 MG tablet Take 2 mg by mouth daily with breakfast.     glucose blood test strip Dispense glucometer, strips and lancets preferred by patient's insurance (DX: E11.9 / type 2 DM)     levothyroxine (SYNTHROID) 25 MCG tablet Take 25 mcg by mouth daily before breakfast.     lidocaine-prilocaine (EMLA) cream Apply to affected area once 30 g 3   Needle, Disp, (HYPODERMIC NEEDLE 26GX5/8") 26G X 5/8" MISC See admin instructions.     Omega-3 Fatty Acids (FISH OIL PO) Take 2 capsules by mouth daily.     ondansetron (ZOFRAN) 8 MG tablet Take 8 mg by mouth every 8 (eight) hours as needed.     predniSONE (DELTASONE) 20 MG tablet Take 1 tablet (20 mg total) by mouth daily with breakfast. 30 tablet 0   prochlorperazine (COMPAZINE) 10 MG tablet Take 1 tablet (10 mg total) by mouth every 6 (six) hours as needed for nausea or vomiting. 30 tablet 0    rosuvastatin (CRESTOR) 10 MG tablet Take 10 mg by mouth daily.     tamsulosin (FLOMAX) 0.4 MG CAPS capsule Take 0.4 mg by mouth daily.     XARELTO 20 MG TABS tablet TAKE 1 TABLET BY MOUTH DAILY WITH SUPPER. 90 tablet 1   Facility-Administered Medications Prior to Visit  Medication Dose Route Frequency Provider Last Rate Last Admin   0.9 %  sodium chloride infusion   Intravenous PRN Wenda Low, MD             Objective:   Physical Exam Vitals:   07/21/21 0914  BP: 122/82  Pulse: 65  Temp: 97.7 F (36.5 C)  TempSrc: Oral  SpO2: 93%  Weight: 277 lb (125.6 kg)  Height: 6\' 4"  (1.93 m)   Gen: Pleasant, obese man, in no distress,  normal affect  ENT: No lesions,  mouth clear,  oropharynx clear, no postnasal drip  Neck: No JVD, no stridor  Lungs: No use of accessory muscles, distant, no crackles or wheezing on normal respiration, no wheeze on forced expiration  Cardiovascular: RRR, heart sounds normal, no murmur or gallops, trace peripheral edema  Musculoskeletal: No deformities, no cyanosis or clubbing  Neuro: alert, awake, non focal  Skin: Warm, no lesions or rash      Assessment & Plan:  COPD (chronic obstructive pulmonary disease) (HCC) It was unable to do spirometry today due to cough.  He may have gotten some clinical benefit from [facility], suspect he does have a component of COPD.  Would like to change him to respiratory both to add LAMA and also avoid powdered formulation, hopefully this will help both breathing and coughing.  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  Consider also superimposed effects of COVID-19 which he had since I last saw him.  He is slowly clinically benefiting from prednisone 20 mg daily which was started in early October by Dr. Alvy Bimler.  I think we should continue to complete a months therapy, then taper to 10 mg and then off.  I will repeat a CT chest after  that to evaluate for resolution of his infiltrates noted on PET scan from September.  Baltazar Apo, MD, PhD 07/21/2021, 1:13 PM [facility] [phone] or if no answer before 7:00PM call [phone] For any issues after 7:00PM please call eLink [phone]

## 2021-07-21 NOTE — Patient Instructions (Signed)
Stop Breo for now We will do a trial of Breztri, 2 puffs twice a day.  Rinse and gargle after using.  Take this medication on a schedule.  Keep track of whether you feel it helps your breathing and your coughing. Keep your albuterol available use 2 puffs when needed for shortness of breath, chest tightness, wheezing. Continue your prednisone 20 mg daily until it runs out.  After that take 10 mg daily for 7 days and then stop. We will plan to repeat your CT scan of the chest in late November. Follow Dr. [name] after your CT scan of the chest so that we can review the results together.

## 2021-07-21 NOTE — Assessment & Plan Note (Signed)
It was unable to do spirometry today due to cough.  He may have gotten some clinical benefit from [facility], suspect he does have a component of COPD.  Would like to change him to respiratory both to add LAMA and also avoid powdered formulation, hopefully this will help both breathing and coughing.

## 2021-07-21 NOTE — Assessment & Plan Note (Signed)
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  Consider also superimposed effects of COVID-19 which he had since I last saw him.  He is slowly clinically benefiting from prednisone 20 mg daily which was started in early October by Dr. Alvy Bimler.  I think we should continue to complete a months therapy, then taper to 10 mg and then off.  I will repeat a CT chest after that to evaluate for resolution of his infiltrates noted on PET scan from September.

## 2021-07-26 ENCOUNTER — Other Ambulatory Visit: Payer: Self-pay | Admitting: Hematology and Oncology

## 2021-07-26 DIAGNOSIS — Z7189 Other specified counseling: Secondary | ICD-10-CM

## 2021-07-26 DIAGNOSIS — C8338 Diffuse large B-cell lymphoma, lymph nodes of multiple sites: Secondary | ICD-10-CM

## 2021-07-30 ENCOUNTER — Other Ambulatory Visit: Payer: Self-pay | Admitting: Hematology and Oncology

## 2021-07-30 DIAGNOSIS — C9110 Chronic lymphocytic leukemia of B-cell type not having achieved remission: Secondary | ICD-10-CM

## 2021-07-30 DIAGNOSIS — Z7189 Other specified counseling: Secondary | ICD-10-CM

## 2021-08-04 ENCOUNTER — Ambulatory Visit
Admission: RE | Admit: 2021-08-04 | Discharge: 2021-08-04 | Disposition: A | Discharge status: Home / Self Care | Payer: Medicare Other | Source: Ambulatory Visit | Attending: Emergency Medicine | Admitting: Emergency Medicine

## 2021-08-04 DIAGNOSIS — J189 Pneumonia, unspecified organism: Secondary | ICD-10-CM

## 2021-08-05 ENCOUNTER — Telehealth: Payer: Self-pay | Admitting: Emergency Medicine

## 2021-08-05 DIAGNOSIS — Z23 Encounter for immunization: Secondary | ICD-10-CM | POA: Diagnosis not present

## 2021-08-05 NOTE — Telephone Encounter (Signed)
Noted. Derek [name] is on DPR)  RB please advise of CT results. Thanks :)

## 2021-08-06 MED ORDER — BENZONATATE 200 MG PO CAPS
200.0000 mg | ORAL_CAPSULE | Freq: Three times a day (TID) | ORAL | 2 refills | Status: AC | PRN
Start: 2021-08-06 — End: ?

## 2021-08-06 MED ORDER — FLUTICASONE PROPIONATE 50 MCG/ACT NA SUSP
2.0000 | Freq: Every day | NASAL | 2 refills | Status: AC
Start: 2021-08-06 — End: ?

## 2021-08-06 NOTE — Telephone Encounter (Signed)
Reviewed the CT chest with Mateo Flow > shows improvement in his pneumonitis with the steroids.  Unfortunately the right lower lobe rounded opacity that was biopsied and is consistent with lymphoma is larger (off chemotherapy).  Explained to Mateo Flow that they will need to follow-up with Dr. Calton Dach to try and find another chemotherapy or immunotherapy regimen that Donnelle can tolerate that will cause pneumonitis.  I asked him to finish the prednisone taper to off (almost completed).  I will send a message to Dr. Alvy Bimler to review the CT. He is having a lot of coughing and nasal congestion, drainage.  I recommended that they start loratadine 10 mg once daily.  I will call and fluticasone nasal spray, 2 sprays each nostril once daily.  I will also call in [place].

## 2021-08-09 ENCOUNTER — Other Ambulatory Visit: Payer: Self-pay | Admitting: Hematology and Oncology

## 2021-08-09 DIAGNOSIS — Z7189 Other specified counseling: Secondary | ICD-10-CM

## 2021-08-09 DIAGNOSIS — C8338 Diffuse large B-cell lymphoma, lymph nodes of multiple sites: Secondary | ICD-10-CM

## 2021-08-09 NOTE — Telephone Encounter (Signed)
Forwarding our discussion to Dr Alvy Bimler as Juluis Rainier

## 2021-08-11 ENCOUNTER — Telehealth: Payer: Self-pay | Admitting: *Deleted

## 2021-08-11 NOTE — Telephone Encounter (Signed)
Patient called - he has been cleared by Dr. [name] to resume treatment. His next appt at [facility] for labs and to see Dr.Gorsuch is 08/24/21. He would like to have appts earlier in December if possible.  Message information routed to Dr. Alvy Bimler and schedule message sent.

## 2021-08-13 ENCOUNTER — Telehealth: Payer: Self-pay

## 2021-08-13 NOTE — Telephone Encounter (Signed)
Spoke with patient in regards to his conversation with scheduler and request for earlier appointments. Upon talking with scheduler, patient had noted increasing SOB. When RN spoke with patient, patient noted that this had been ongoing. Patient states that he is "fine" as long as he is sitting. He becomes SOB with exertion and needs to rest frequently. Confirmed that patient had inhalers with him. Patient noted that they were helping. Asked patient if he felt he needed further evaluation, but patient declined to go to the ED. In regards to appointments, Dr. Alvy Bimler offered to see patient in clinic on 12/1 at 3 pm as long as he was stable. Patient agreeable to this. Scheduler made aware. Patient knows to seek emergent evaluation, if SOB persists. Information patient that regarding his infusion appointments, that he may have to keep 12/6 appointments as scheduled, since patient declined 11/29 appointments d/t being out of town. Patient verbalized an understanding. Informed patient that our [employer] location may have earlier availability, but our schedulers would have to look into it. Patient appreciative and would like to still have earliest appointment, if possible.  All questions and concerns answered at this time.  Dr. Alvy Bimler made aware of the conversation.

## 2021-08-13 NOTE — Telephone Encounter (Signed)
-----   Message from [name], MD sent at 08/09/2021  8:39 AM EST ----- Infusion room is busier towards end of the week I will change LOS to first week of Dec ----- Message ----- From: Jolaine Click, RN Sent: 08/09/2021   8:32 AM EST To: [name], MD  Just spoke with the patient. He is asking if there is anyway to change the appointment to Wednesday or Thursday of next week, as he is currently in [place] and will not be returning till Tuesday afternoon... ----- Message ----- From: [name], MD Sent: 08/09/2021   8:18 AM EST To: Jolaine Click, RN  Hi,  Please call him and let him know I have placed LOS to see him next Tuesday to resume chemo Scheduler will call

## 2021-08-17 ENCOUNTER — Telehealth: Payer: Self-pay | Admitting: Hematology and Oncology

## 2021-08-17 NOTE — Telephone Encounter (Signed)
Scheduled per patient request. Called and spoke with patient. Confirmed appt

## 2021-08-18 ENCOUNTER — Emergency Department (HOSPITAL_COMMUNITY): Payer: Medicare Other

## 2021-08-18 ENCOUNTER — Other Ambulatory Visit: Payer: Self-pay

## 2021-08-18 ENCOUNTER — Encounter (HOSPITAL_COMMUNITY): Payer: Self-pay | Admitting: Emergency Medicine

## 2021-08-18 ENCOUNTER — Emergency Department (HOSPITAL_COMMUNITY)
Admission: EM | Admit: 2021-08-18 | Discharge: 2021-08-18 | Disposition: A | Discharge status: Home / Self Care | Payer: Medicare Other | Source: Home / Self Care | Attending: Emergency Medicine | Admitting: Emergency Medicine

## 2021-08-18 ENCOUNTER — Inpatient Hospital Stay (HOSPITAL_COMMUNITY)
Admission: EM | Admit: 2021-08-18 | Discharge: 2021-09-19 | DRG: 840 | Disposition: E | Payer: Medicare Other | Attending: Internal Medicine | Admitting: Internal Medicine

## 2021-08-18 DIAGNOSIS — U071 COVID-19: Secondary | ICD-10-CM | POA: Diagnosis present

## 2021-08-18 DIAGNOSIS — E440 Moderate protein-calorie malnutrition: Secondary | ICD-10-CM | POA: Diagnosis present

## 2021-08-18 DIAGNOSIS — E1122 Type 2 diabetes mellitus with diabetic chronic kidney disease: Secondary | ICD-10-CM

## 2021-08-18 DIAGNOSIS — I480 Paroxysmal atrial fibrillation: Secondary | ICD-10-CM | POA: Diagnosis not present

## 2021-08-18 DIAGNOSIS — J440 Chronic obstructive pulmonary disease with acute lower respiratory infection: Secondary | ICD-10-CM | POA: Diagnosis present

## 2021-08-18 DIAGNOSIS — Z7189 Other specified counseling: Secondary | ICD-10-CM | POA: Diagnosis not present

## 2021-08-18 DIAGNOSIS — R059 Cough, unspecified: Secondary | ICD-10-CM | POA: Diagnosis not present

## 2021-08-18 DIAGNOSIS — I2510 Atherosclerotic heart disease of native coronary artery without angina pectoris: Secondary | ICD-10-CM | POA: Diagnosis not present

## 2021-08-18 DIAGNOSIS — J9601 Acute respiratory failure with hypoxia: Secondary | ICD-10-CM | POA: Diagnosis present

## 2021-08-18 DIAGNOSIS — R0902 Hypoxemia: Secondary | ICD-10-CM

## 2021-08-18 DIAGNOSIS — J441 Chronic obstructive pulmonary disease with (acute) exacerbation: Secondary | ICD-10-CM | POA: Diagnosis present

## 2021-08-18 DIAGNOSIS — E039 Hypothyroidism, unspecified: Secondary | ICD-10-CM | POA: Diagnosis not present

## 2021-08-18 DIAGNOSIS — E871 Hypo-osmolality and hyponatremia: Secondary | ICD-10-CM | POA: Diagnosis present

## 2021-08-18 DIAGNOSIS — C8330 Diffuse large B-cell lymphoma, unspecified site: Secondary | ICD-10-CM | POA: Diagnosis present

## 2021-08-18 DIAGNOSIS — D72819 Decreased white blood cell count, unspecified: Secondary | ICD-10-CM

## 2021-08-18 DIAGNOSIS — J9602 Acute respiratory failure with hypercapnia: Secondary | ICD-10-CM | POA: Diagnosis present

## 2021-08-18 DIAGNOSIS — N184 Chronic kidney disease, stage 4 (severe): Secondary | ICD-10-CM | POA: Diagnosis present

## 2021-08-18 DIAGNOSIS — J1282 Pneumonia due to coronavirus disease 2019: Secondary | ICD-10-CM | POA: Diagnosis present

## 2021-08-18 DIAGNOSIS — B370 Candidal stomatitis: Secondary | ICD-10-CM | POA: Diagnosis not present

## 2021-08-18 DIAGNOSIS — J449 Chronic obstructive pulmonary disease, unspecified: Secondary | ICD-10-CM

## 2021-08-18 DIAGNOSIS — Z7901 Long term (current) use of anticoagulants: Secondary | ICD-10-CM

## 2021-08-18 DIAGNOSIS — Z79899 Other long term (current) drug therapy: Secondary | ICD-10-CM

## 2021-08-18 DIAGNOSIS — Z515 Encounter for palliative care: Secondary | ICD-10-CM | POA: Diagnosis not present

## 2021-08-18 DIAGNOSIS — L89323 Pressure ulcer of left buttock, stage 3: Secondary | ICD-10-CM | POA: Diagnosis not present

## 2021-08-18 DIAGNOSIS — D801 Nonfamilial hypogammaglobulinemia: Secondary | ICD-10-CM | POA: Diagnosis not present

## 2021-08-18 DIAGNOSIS — I4892 Unspecified atrial flutter: Secondary | ICD-10-CM | POA: Diagnosis present

## 2021-08-18 DIAGNOSIS — I509 Heart failure, unspecified: Secondary | ICD-10-CM | POA: Diagnosis not present

## 2021-08-18 DIAGNOSIS — D696 Thrombocytopenia, unspecified: Secondary | ICD-10-CM

## 2021-08-18 DIAGNOSIS — J9600 Acute respiratory failure, unspecified whether with hypoxia or hypercapnia: Secondary | ICD-10-CM | POA: Diagnosis not present

## 2021-08-18 DIAGNOSIS — N179 Acute kidney failure, unspecified: Secondary | ICD-10-CM | POA: Diagnosis not present

## 2021-08-18 DIAGNOSIS — J9811 Atelectasis: Secondary | ICD-10-CM | POA: Diagnosis not present

## 2021-08-18 DIAGNOSIS — E1165 Type 2 diabetes mellitus with hyperglycemia: Secondary | ICD-10-CM | POA: Diagnosis present

## 2021-08-18 DIAGNOSIS — Z6832 Body mass index (BMI) 32.0-32.9, adult: Secondary | ICD-10-CM

## 2021-08-18 DIAGNOSIS — I130 Hypertensive heart and chronic kidney disease with heart failure and stage 1 through stage 4 chronic kidney disease, or unspecified chronic kidney disease: Secondary | ICD-10-CM | POA: Diagnosis present

## 2021-08-18 DIAGNOSIS — Z66 Do not resuscitate: Secondary | ICD-10-CM | POA: Diagnosis present

## 2021-08-18 DIAGNOSIS — D61810 Antineoplastic chemotherapy induced pancytopenia: Secondary | ICD-10-CM | POA: Diagnosis present

## 2021-08-18 DIAGNOSIS — B379 Candidiasis, unspecified: Secondary | ICD-10-CM | POA: Diagnosis not present

## 2021-08-18 DIAGNOSIS — D703 Neutropenia due to infection: Secondary | ICD-10-CM

## 2021-08-18 DIAGNOSIS — C8590 Non-Hodgkin lymphoma, unspecified, unspecified site: Secondary | ICD-10-CM | POA: Diagnosis not present

## 2021-08-18 DIAGNOSIS — I700 Atherosclerosis of aorta: Secondary | ICD-10-CM | POA: Diagnosis not present

## 2021-08-18 DIAGNOSIS — D709 Neutropenia, unspecified: Secondary | ICD-10-CM

## 2021-08-18 DIAGNOSIS — Z87891 Personal history of nicotine dependence: Secondary | ICD-10-CM

## 2021-08-18 DIAGNOSIS — I5031 Acute diastolic (congestive) heart failure: Secondary | ICD-10-CM | POA: Diagnosis not present

## 2021-08-18 DIAGNOSIS — Z9221 Personal history of antineoplastic chemotherapy: Secondary | ICD-10-CM

## 2021-08-18 DIAGNOSIS — C8338 Diffuse large B-cell lymphoma, lymph nodes of multiple sites: Secondary | ICD-10-CM | POA: Diagnosis present

## 2021-08-18 DIAGNOSIS — Z9889 Other specified postprocedural states: Secondary | ICD-10-CM

## 2021-08-18 DIAGNOSIS — J948 Other specified pleural conditions: Secondary | ICD-10-CM | POA: Diagnosis not present

## 2021-08-18 DIAGNOSIS — R609 Edema, unspecified: Secondary | ICD-10-CM | POA: Diagnosis not present

## 2021-08-18 DIAGNOSIS — T451X5A Adverse effect of antineoplastic and immunosuppressive drugs, initial encounter: Secondary | ICD-10-CM | POA: Diagnosis present

## 2021-08-18 DIAGNOSIS — J189 Pneumonia, unspecified organism: Secondary | ICD-10-CM

## 2021-08-18 DIAGNOSIS — R627 Adult failure to thrive: Secondary | ICD-10-CM | POA: Diagnosis present

## 2021-08-18 DIAGNOSIS — E781 Pure hyperglyceridemia: Secondary | ICD-10-CM | POA: Diagnosis present

## 2021-08-18 DIAGNOSIS — J90 Pleural effusion, not elsewhere classified: Secondary | ICD-10-CM | POA: Diagnosis not present

## 2021-08-18 DIAGNOSIS — R0602 Shortness of breath: Secondary | ICD-10-CM

## 2021-08-18 DIAGNOSIS — L8990 Pressure ulcer of unspecified site, unspecified stage: Secondary | ICD-10-CM | POA: Insufficient documentation

## 2021-08-18 DIAGNOSIS — I440 Atrioventricular block, first degree: Secondary | ICD-10-CM | POA: Diagnosis present

## 2021-08-18 DIAGNOSIS — E119 Type 2 diabetes mellitus without complications: Secondary | ICD-10-CM

## 2021-08-18 DIAGNOSIS — I358 Other nonrheumatic aortic valve disorders: Secondary | ICD-10-CM | POA: Diagnosis not present

## 2021-08-18 DIAGNOSIS — Z8249 Family history of ischemic heart disease and other diseases of the circulatory system: Secondary | ICD-10-CM

## 2021-08-18 LAB — COMPREHENSIVE METABOLIC PANEL
ALT: 15 U/L (ref 0–44)
AST: 33 U/L (ref 15–41)
Albumin: 2.7 g/dL — ABNORMAL LOW (ref 3.5–5.0)
Alkaline Phosphatase: 62 U/L (ref 38–126)
Anion gap: 11 (ref 5–15)
BUN: 37 mg/dL — ABNORMAL HIGH (ref 8–23)
CO2: 19 mmol/L — ABNORMAL LOW (ref 22–32)
Calcium: 8.4 mg/dL — ABNORMAL LOW (ref 8.9–10.3)
Chloride: 104 mmol/L (ref 98–111)
Creatinine, Ser: 2.13 mg/dL — ABNORMAL HIGH (ref 0.61–1.24)
GFR, Estimated: 31 mL/min — ABNORMAL LOW (ref >60)
Glucose, Bld: 190 mg/dL — ABNORMAL HIGH (ref 70–99)
Potassium: 4.4 mmol/L (ref 3.5–5.1)
Sodium: 134 mmol/L — ABNORMAL LOW (ref 135–145)
Total Bilirubin: 0.8 mg/dL (ref 0.3–1.2)
Total Protein: 6.3 g/dL — ABNORMAL LOW (ref 6.5–8.1)

## 2021-08-18 LAB — CBC WITH DIFFERENTIAL/PLATELET
Abs Immature Granulocytes: 0.16 10*3/uL — ABNORMAL HIGH (ref 0.00–0.07)
Basophils Absolute: 0 10*3/uL (ref 0.0–0.1)
Basophils Relative: 0 %
Eosinophils Absolute: 0 10*3/uL (ref 0.0–0.5)
Eosinophils Relative: 0 %
HCT: 35.8 % — ABNORMAL LOW (ref 39.0–52.0)
Hemoglobin: 11.3 g/dL — ABNORMAL LOW (ref 13.0–17.0)
Immature Granulocytes: 7 %
Lymphocytes Relative: 11 %
Lymphs Abs: 0.2 10*3/uL — ABNORMAL LOW (ref 0.7–4.0)
MCH: 27.6 pg (ref 26.0–34.0)
MCHC: 31.6 g/dL (ref 30.0–36.0)
MCV: 87.3 fL (ref 80.0–100.0)
Monocytes Absolute: 0.4 10*3/uL (ref 0.1–1.0)
Monocytes Relative: 17 %
Neutro Abs: 1.5 10*3/uL — ABNORMAL LOW (ref 1.7–7.7)
Neutrophils Relative %: 65 %
Platelets: 135 10*3/uL — ABNORMAL LOW (ref 150–400)
RBC: 4.1 MIL/uL — ABNORMAL LOW (ref 4.22–5.81)
RDW: 16.6 % — ABNORMAL HIGH (ref 11.5–15.5)
WBC: 2.3 10*3/uL — ABNORMAL LOW (ref 4.0–10.5)
nRBC: 0 % (ref 0.0–0.2)

## 2021-08-18 LAB — RESP PANEL BY RT-PCR (FLU A&B, COVID) ARPGX2
Influenza A by PCR: NEGATIVE
Influenza B by PCR: NEGATIVE
SARS Coronavirus 2 by RT PCR: POSITIVE — AB

## 2021-08-18 LAB — LACTIC ACID, PLASMA: Lactic Acid, Venous: 1.3 mmol/L (ref 0.5–1.9)

## 2021-08-18 MED ORDER — SODIUM CHLORIDE 0.9 % IV SOLN
2.0000 g | INTRAVENOUS | Status: AC
  Administered 2021-08-19 – 2021-08-23 (×5): 2 g via INTRAVENOUS
  Filled 2021-08-18 (×5): qty 20

## 2021-08-18 MED ORDER — SODIUM CHLORIDE 0.9 % IV SOLN
500.0000 mg | Freq: Once | INTRAVENOUS | Status: AC
  Administered 2021-08-18: 500 mg via INTRAVENOUS
  Filled 2021-08-18: qty 500

## 2021-08-18 MED ORDER — SODIUM CHLORIDE 0.9 % IV SOLN
500.0000 mg | INTRAVENOUS | Status: AC
  Administered 2021-08-19 – 2021-08-23 (×5): 500 mg via INTRAVENOUS
  Filled 2021-08-18 (×5): qty 500

## 2021-08-18 MED ORDER — SODIUM CHLORIDE 0.9 % IV SOLN
1.0000 g | Freq: Once | INTRAVENOUS | Status: AC
  Administered 2021-08-18: 1 g via INTRAVENOUS
  Filled 2021-08-18: qty 10

## 2021-08-18 MED ORDER — ALBUTEROL SULFATE HFA 108 (90 BASE) MCG/ACT IN AERS
2.0000 | INHALATION_SPRAY | RESPIRATORY_TRACT | Status: DC | PRN
  Administered 2021-08-21 – 2021-08-22 (×4): 2 via RESPIRATORY_TRACT
  Filled 2021-08-18 (×2): qty 6.7

## 2021-08-18 MED ORDER — SODIUM CHLORIDE 0.9 % IV SOLN: INTRAVENOUS | Status: DC

## 2021-08-18 NOTE — ED Notes (Signed)
Patient was sleeping when I entered the room. He is now on 4L 

## 2021-08-18 NOTE — ED Notes (Signed)
Port accessed

## 2021-08-18 NOTE — ED Triage Notes (Signed)
[place] c/o SOB x3 months, productive cough and dyspnea w/ exertion. Pt dx several months ago with Covid and reports symptoms have worsened since then. Initially 88% RA and 98% on 4 L [place].  5 Albuterol 0.5 Atrovent  12 lead unremarkable 20 g RAC   BP 126/72

## 2021-08-18 NOTE — H&P (Incomplete)
History and Physical    Derek [name] QIH:[id number] DOB: 15-Feb-1941 DOA: 08/12/2021  PCP: Wenda Low, MD  Patient coming from: Home  I have personally briefly reviewed patient's old medical records in [place]  Chief Complaint: Increasing shortness of breath  HPI: Derek [name] is a 80 y.o. 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  Patient has been noticing increasing shortness of breath for at least the past 3 months.  Has been following with pulmonology   Has noted fever 103 Fahrenheit last week that resolved following antipyretic.  He was diagnosed with COVID-19 back in February and continues to be positive.  Patient has been off chemotherapy for the past 3 months due to persistent respiratory issues and possible infection.  Reports 60 pound weight loss in the past 8 months due to decreased appetite.  ED Course: He was afebrile, normotensive and was hypoxic down to 85% on room air.  Placed on 4 L via nasal cannula. COVID-19 PCR remains positive Leukopenia 2.3 with left shift and absolute neutrophil count of 4.5 Platelet of 135, hemoglobin of 5.3  Sodium 134, K of 4, creatinine of 2.13 which is around his baseline  CT chest showed new moderate size right pleural effusion and diffuse right lung peripheral solid and groundglass nodular density representing infectious versus neoplastic.   He was started on IV Rocephin and azithromycin for pneumonia and hospitalist called for admission.  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   GERD (gastroesophageal reflux disease)    occ tums   H/O cardiac catheterization    H/O exercise stress test 1999, 2002   Hypertension    Hypertriglyceridemia    Lymphocytosis    Obesity    Pulmonary Function Test 09/2018   PFTs 09/2018:  FEV1 88% predicted; FEV1/FVC 78%; DLCO cor 58    Past Surgical History:  Procedure Laterality Date   CARDIOVERSION N/A 09/05/2018   Procedure: CARDIOVERSION;  Surgeon: Jerline Pain, MD;  Location: [facility];  Service: Cardiovascular;  Laterality: N/A;   CARDIOVERSION N/A 10/22/2018   Procedure: CARDIOVERSION;  Surgeon: Jerline Pain, MD;  Location: [place];  Service: Cardiovascular;  Laterality: N/A;   CARDIOVERSION N/A 08/28/2020   Procedure: CARDIOVERSION;  Surgeon: Josue [name], MD;  Location: [place];  Service: Cardiovascular;  Laterality: N/A;   CARDIOVERSION N/A 11/30/2020   Procedure: CARDIOVERSION;  Surgeon: Jerline Pain, MD;  Location: [facility] ENDOSCOPY;  Service: Cardiovascular;  Laterality: N/A;   CARDIOVERSION N/A 04/22/2021   Procedure: CARDIOVERSION;  Surgeon: Buford Dresser, MD;  Location: [facility] ENDOSCOPY;  Service: Cardiovascular;  Laterality: N/A;   CHOLECYSTECTOMY N/A 07/12/2016   Procedure: LAPAROSCOPIC CHOLECYSTECTOMY;  Surgeon: Donnie Mesa, MD;  Location: [place];  Service: General;  Laterality: N/A;   EXTRACORPOREAL SHOCK WAVE LITHOTRIPSY Right 11/18/2019   Procedure: EXTRACORPOREAL SHOCK WAVE LITHOTRIPSY (ESWL);  Surgeon: Lucas Mallow, MD;  Location: [facility];  Service: Urology;  Laterality: Right;   FOOT SURGERY Right    IR IMAGING  GUIDED PORT INSERTION  03/19/2021   NASAL SINUS SURGERY     TEE WITHOUT CARDIOVERSION N/A 09/05/2018   Procedure: TRANSESOPHAGEAL ECHOCARDIOGRAM (TEE);  Surgeon: Jerline Pain, MD;  Location: [facility] ENDOSCOPY;  Service: Cardiovascular;  Laterality: N/A;   TEE WITHOUT CARDIOVERSION N/A  08/28/2020   Procedure: TRANSESOPHAGEAL ECHOCARDIOGRAM (TEE);  Surgeon: Josue [name], MD;  Location: [facility] ENDOSCOPY;  Service: Cardiovascular;  Laterality: N/A;   TONSILLECTOMY     UMBILICAL HERNIA REPAIR N/A 07/12/2016   Procedure: UMBILICAL HERINA REPAIR;  Surgeon: Donnie Mesa, MD;  Location: [place];  Service: General;  Laterality: N/A;   vericose vein stripping       reports that he quit smoking about 32 years ago. His smoking use included cigarettes. He has a 30.00 pack-year smoking history. He has never used smokeless tobacco. He reports current alcohol use of about 17.0 - 19.0 standard drinks per week. He reports that he does not use drugs. Social History  Allergies  Allergen Reactions   Lipitor [Atorvastatin] Rash   Metformin Diarrhea    Family History  Problem Relation Age of Onset   Heart attack Mother    Cancer Father        liver     Prior to Admission medications   Medication Sig Start Date End Date Taking? Authorizing Provider  amiodarone (PACERONE) 200 MG tablet Take 1 tablet (200 mg total) by mouth daily. 05/31/21  Yes Fenton, Clint R, PA  benzonatate (TESSALON) 200 MG capsule Take 1 capsule (200 mg total) by mouth 3 (three) times daily as needed for cough. 08/06/21  Yes Collene Gobble, MD  Budeson-Glycopyrrol-Formoterol (BREZTRI AEROSPHERE) 160-9-4.8 MCG/ACT AERO Inhale 2 puffs into the lungs in the morning and at bedtime. 07/21/21  Yes Collene Gobble, MD  fluticasone (FLONASE) 50 MCG/ACT nasal spray Place 2 sprays into both nostrils daily. 08/06/21  Yes Collene Gobble, MD  glimepiride (AMARYL) 2 MG tablet Take 2 mg by mouth daily with breakfast. 08/24/20  Yes [provider]  levothyroxine (SYNTHROID) 25 MCG tablet Take 25 mcg by mouth daily before breakfast.   Yes [provider]  metoprolol succinate (TOPROL-XL) 25 MG 24 hr tablet TAKE 1/2 TABLET BY MOUTH EVERY DAY 07/30/21  Yes [name], MD  Omega-3 Fatty Acids (FISH OIL PO) Take 2 capsules  by mouth daily.   Yes [provider]  ondansetron (ZOFRAN) 8 MG tablet Take 8 mg by mouth every 8 (eight) hours as needed.   Yes [provider]  predniSONE (DELTASONE) 20 MG tablet TAKE 2 TABLETS BY MOUTH DAILY. TAKE WITH FOOD ON FOR 4 DAYS AFTER CHEMOTHERAPY EVERY 3 WEEKS 08/09/21  Yes Gorsuch, Ni, MD  rosuvastatin (CRESTOR) 10 MG tablet Take 10 mg by mouth daily. 02/27/20  Yes [provider]  tamsulosin (FLOMAX) 0.4 MG CAPS capsule Take 0.4 mg by mouth daily. 03/21/20  Yes [provider]  VITAMIN D PO Take 1 capsule by mouth daily.   Yes [provider]  XARELTO 20 MG TABS tablet TAKE 1 TABLET BY MOUTH DAILY WITH SUPPER. 06/24/21  Yes Fenton, Clint R, PA  acyclovir (ZOVIRAX) 400 MG tablet TAKE 1 TABLET BY MOUTH EVERY DAY Patient not taking: Reported on 08/09/2021 06/07/21   [name], MD  allopurinol (ZYLOPRIM) 300 MG tablet TAKE 1 TABLET BY MOUTH EVERY DAY Patient not taking: Reported on 08/08/2021 07/30/21   [name], MD  doxepin (SINEQUAN) 10 MG capsule Take 10 mg by mouth at bedtime. Patient not  taking: Reported on 08/15/2021    [provider]  fluticasone furoate-vilanterol (BREO ELLIPTA) 100-25 MCG/INH AEPB Inhale 1 puff into the lungs daily. Patient not taking: Reported on 08/12/2021 07/02/21   Martyn Ehrich, NP  glucose blood test strip Dispense glucometer, strips and lancets preferred by patient's insurance (DX: E11.9 / type 2 DM) 05/18/17   [provider]  lidocaine-prilocaine (EMLA) cream Apply to affected area once Patient not taking: Reported on 08/17/2021 03/23/21   [name], MD  MYRBETRIQ 50 MG TB24 tablet Take 50 mg by mouth daily. Patient not taking: Reported on 07/23/2021 08/16/21   [provider]  Needle, Disp, (HYPODERMIC NEEDLE 26GX5/8") 26G X 5/8" MISC See admin instructions. 10/23/17   [provider]  predniSONE (DELTASONE) 10 MG tablet Take 1 tablet (10 mg total) by mouth daily  with breakfast. Patient not taking: Reported on 07/28/2021 07/21/21   Collene Gobble, MD  prochlorperazine (COMPAZINE) 10 MG tablet Take 1 tablet (10 mg total) by mouth every 6 (six) hours as needed for nausea or vomiting. Patient not taking: Reported on 07/30/2021 03/23/21   [name], MD    Physical Exam: Vitals:   07/29/2021 1830 07/28/2021 1900 07/30/2021 2000 08/13/2021 2040  BP: 139/69 140/75 (!) 145/76 136/68  Pulse: 71 75 71 73  Resp: 16 18 17  (!) 21  Temp:      TempSrc:      SpO2: 91% 98% 92% (!) 89%    Constitutional: NAD, calm, comfortable Vitals:   08/17/2021 1830 07/20/2021 1900 07/29/2021 2000 08/05/2021 2040  BP: 139/69 140/75 (!) 145/76 136/68  Pulse: 71 75 71 73  Resp: 16 18 17  (!) 21  Temp:      TempSrc:      SpO2: 91% 98% 92% (!) 89%   Eyes: PERRL, lids and conjunctivae normal ENMT: Mucous membranes are moist. Posterior pharynx clear of any exudate or lesions.Normal dentition.  Neck: normal, supple, no masses, no thyromegaly Respiratory: clear to auscultation bilaterally, no wheezing, no crackles. Normal respiratory effort. No accessory muscle use.  Cardiovascular: Regular rate and rhythm, no murmurs / rubs / gallops. No extremity edema. 2+ pedal pulses. No carotid bruits.  Abdomen: no tenderness, no masses palpated. No hepatosplenomegaly. Bowel sounds positive.  Musculoskeletal: no clubbing / cyanosis. No joint deformity upper and lower extremities. Good ROM, no contractures. Normal muscle tone.  Skin: no rashes, lesions, ulcers. No induration Neurologic: CN 2-12 grossly intact. Sensation intact, DTR normal. Strength 5/5 in all 4.  Psychiatric: Normal judgment and insight. Alert and oriented x 3. Normal mood.   (Anything < 9 systems with 2 bullets each down codes to level 1) (If patient refuses exam cant bill higher level) (Make sure to document decubitus ulcers present on admission -- if possible -- and whether patient has chronic indwelling catheter at time of  admission)  Labs on Admission: I have personally reviewed following labs and imaging studies  CBC: Recent Labs  Lab 08/03/2021 1551  WBC 2.3*  NEUTROABS 1.5*  HGB 11.3*  HCT 35.8*  MCV 87.3  PLT 932*   Basic Metabolic Panel: Recent Labs  Lab 07/20/2021 1551  NA 134*  K 4.4  CL 104  CO2 19*  GLUCOSE 190*  BUN 37*  CREATININE 2.13*  CALCIUM 8.4*   GFR: CrCl cannot be calculated (Unknown ideal weight.). Liver Function Tests: Recent Labs  Lab 08/06/2021 1551  AST 33  ALT 15  ALKPHOS 62  BILITOT 0.8  PROT 6.3*  ALBUMIN 2.7*  No results for input(s): LIPASE, AMYLASE in the last 168 hours. No results for input(s): AMMONIA in the last 168 hours. Coagulation Profile: No results for input(s): INR, PROTIME in the last 168 hours. Cardiac Enzymes: No results for input(s): CKTOTAL, CKMB, CKMBINDEX, TROPONINI in the last 168 hours. BNP (last 3 results) No results for input(s): PROBNP in the last 8760 hours. HbA1C: No results for input(s): HGBA1C in the last 72 hours. CBG: No results for input(s): GLUCAP in the last 168 hours. Lipid Profile: No results for input(s): CHOL, HDL, LDLCALC, TRIG, CHOLHDL, LDLDIRECT in the last 72 hours. Thyroid Function Tests: No results for input(s): TSH, T4TOTAL, FREET4, T3FREE, THYROIDAB in the last 72 hours. Anemia Panel: No results for input(s): VITAMINB12, FOLATE, FERRITIN, TIBC, IRON, RETICCTPCT in the last 72 hours. Urine analysis: No results found for: COLORURINE, APPEARANCEUR, LABSPEC, [place], GLUCOSEU, HGBUR, BILIRUBINUR, KETONESUR, PROTEINUR, UROBILINOGEN, NITRITE, LEUKOCYTESUR  Radiological Exams on Admission: DG Chest 2 View  Result Date: 08/09/2021 CLINICAL DATA:  Shortness of breath over the last 3 months with productive cough and dyspnea with exertion. Non-Hodgkin's lymphoma. EXAM: CHEST - 2 VIEW COMPARISON:  CT chest 08/04/2021 and chest radiograph 08/17/2021 FINDINGS: Continued substantial consolidation in the right lower  lobe, potentially from pneumonia or pulmonary lymphoma. Mild indistinct airspace opacity in the left lower lobe as well, with slight obscuration of left hemidiaphragm. Power injectable right Port-A-Cath tip: SVC. Atherosclerotic calcification of the aortic arch. Prominence of mediastinal and pericardial adipose tissues as on recent CT. Trace fluid in the right lower major fissure. A component of pleural effusion on the right is not excluded. IMPRESSION: 1. Continued consolidation in the right lower lobe. The appearance could be from pneumonia or pulmonary involvement by lymphoma. 2. Mild left lower lobe airspace opacity, nonspecific, pneumonia not excluded. 3. At least a trace right pleural effusion with fluid along the lower portion of the major fissure. 4.  Aortic Atherosclerosis (ICD10-I70.0). Electronically Signed   By: Van Clines M.D.   On: 08/17/2021 14:33   CT Chest Wo Contrast  Result Date: 08/17/2021 CLINICAL DATA:  Pneumonia.  Cough. EXAM: CT CHEST WITHOUT CONTRAST TECHNIQUE: Multidetector CT imaging of the chest was performed following the standard protocol without IV contrast. COMPARISON:  PET-CT 06/11/2021. FINDINGS: Cardiovascular: Heart and aorta are normal in size. There is no pericardial effusion. There are atherosclerotic calcifications of the aorta and coronary arteries. Right chest port catheter tip ends in the distal SVC. Superior vena cava is small in size, similar to the prior exam. Mediastinum/Nodes: The visualized thyroid gland is within normal limits. There is an enlarged subcarinal lymph node measuring 13 mm short axis similar to the prior study. Additional nonenlarged mediastinal lymph nodes are unchanged. Difficult to assess for right hilar adenopathy secondary to lack of contrast. Esophagus is unremarkable. Lungs/Pleura: There is a new moderate-sized right pleural effusion. There is some peripheral ill-defined ground-glass and solid nodular densities throughout the right  lung measuring up to 8 mm. There is compressive atelectasis and small amount of airspace disease in the right lower lobe containing air bronchograms. There is some ground-glass and interstitial opacities in the left lower lobe is well. No pneumothorax. Trachea and central airways are patent. Upper Abdomen: Gallbladder surgically absent. Limited evaluation of the upper abdomen is otherwise within normal limits. Musculoskeletal: Multilevel degenerative changes affect the spine. No acute fractures are seen. IMPRESSION: 1. New moderate-sized right pleural effusion. 2. Right lower lobe atelectasis and airspace disease worrisome for infection. 3. New diffuse right lung peripheral  solid and ground-glass nodular densities, indeterminate. Findings may be infectious/inflammatory or neoplastic. 4. Ground-glass and interstitial opacities in the left lung base favors as infection/inflammation. 5. Stable enlarged subcarinal lymph node. Electronically Signed   By: Ronney Asters M.D.   On: 08/04/2021 18:43   VAS [place] LOWER EXTREMITY VENOUS (DVT) (7a-7p)  Result Date: 07/29/2021  Lower Venous DVT Study Patient Name:  RAVINDRA BARANEK  Date of Exam:   07/20/2021 Medical Rec #: [id number]    Accession #:    [id number] Date of Birth: 19-Apr-1941    Patient Gender: M Patient Age:   43 years Exam Location:  [facility] Procedure:      VAS [place] LOWER EXTREMITY VENOUS (DVT) Referring Phys: Fredia Sorrow --------------------------------------------------------------------------------  Indications: Edema, and Palpable Cord.  Comparison Study: No prior study Performing Technologist: Maudry Mayhew MHA, RDMS, RVT, RDCS  Examination Guidelines: A complete evaluation includes B-mode imaging, spectral Doppler, color Doppler, and power Doppler as needed of all accessible portions of each vessel. Bilateral testing is considered an integral part of a complete examination. Limited examinations for reoccurring indications may be performed as  noted. The reflux portion of the exam is performed with the patient in reverse Trendelenburg.  +-----+---------------+---------+-----------+----------+--------------+  RIGHT Compressibility Phasicity Spontaneity Properties Thrombus Aging  +-----+---------------+---------+-----------+----------+--------------+  CFV   Full            Yes       Yes                                    +-----+---------------+---------+-----------+----------+--------------+   +---------+---------------+---------+-----------+----------+--------------+  LEFT      Compressibility Phasicity Spontaneity Properties Thrombus Aging  +---------+---------------+---------+-----------+----------+--------------+  CFV       Full            Yes       Yes                                    +---------+---------------+---------+-----------+----------+--------------+  SFJ       Full                                                             +---------+---------------+---------+-----------+----------+--------------+  FV Prox   Full                                                             +---------+---------------+---------+-----------+----------+--------------+  FV Mid    Full                                                             +---------+---------------+---------+-----------+----------+--------------+  FV Distal Full                                                             +---------+---------------+---------+-----------+----------+--------------+  PFV       Full                                                             +---------+---------------+---------+-----------+----------+--------------+  POP       Full            Yes       Yes                                    +---------+---------------+---------+-----------+----------+--------------+  PTV       Full                                                             +---------+---------------+---------+-----------+----------+--------------+  PERO      Full                                                              +---------+---------------+---------+-----------+----------+--------------+  Gastroc   Full                                                             +---------+---------------+---------+-----------+----------+--------------+     Summary: RIGHT: - No evidence of common femoral vein obstruction.  LEFT: - There is no evidence of deep vein thrombosis in the lower extremity.  - No cystic structure found in the popliteal fossa.  *See table(s) above for measurements and observations.    Preliminary       Assessment/Plan  Acute hypoxic respiratory failure secondary to possible pneumonia -CT chest showed a new moderate size right pleural effusion with diffuse right lung peripheral solid and groundglass nodularity concerning for infectious versus neoplasm  Long COVID  Leukopenia Neutropenia   Right pleural effusion possible parapneumonia effusion Thoracentesis with fluid study order  Persistent atrial fibrillation/atrial flutter - Hold Xarelto for thoracentesis tomorrow  CAD Symptoms 2 diabetes  Hypothyroidism  CKD stage IV - Creatinine stable at 2.13    Level of care: Telemetry  Status is: Inpatient  {Inpatient:23812}         Orene Desanctis DO [facility]   If 7PM-7AM, please contact night-coverage [email or web address]   08/11/2021, 11:17 PM

## 2021-08-18 NOTE — ED Provider Notes (Signed)
[facility] DEPT Provider Note   CSN: [id number] Arrival date & time: 08/01/2021  1354     History Chief Complaint  Patient presents with   Shortness of Breath    Derek [name] is a 80 y.o. male.  Patient recently returned from a trip to [place].  Was a passenger in the vehicle.  He states that he is having worse shortness of breath can hardly walk far at all.  His oxygen saturations without oxygen were 88% on room air.  He started on 4 L of oxygen.  Sats were around 94% with that.  Based on records patient's been struggling with shortness of breath problems for several months.  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  Some of his treatment courses have been delayed due to the shortness of breath problems.  He last saw oncology in person on October 11.  Most recently saw pulmonary medicine is following him as well on November 2.  I think there is a component of COPD that think there is a component of pneumonitis secondary to the chemotherapy and they think there is superimposed changes secondary to COVID-19 infection.  Patient has been treated with prednisone and doxycycline courses with minimal effect.  Patient not normally requiring oxygen.  Patient states he is got some swelling gets a little bit more increased on the right lower extremity.      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   GERD (gastroesophageal reflux disease)    occ tums   H/O cardiac catheterization    H/O exercise stress test 1999, 2002   Hypertension    Hypertriglyceridemia    Lymphocytosis    Obesity    Pulmonary Function Test 09/2018   PFTs 09/2018:  FEV1 88% predicted; FEV1/FVC 78%; DLCO  cor 58    Patient Active Problem List   Diagnosis Date Noted   COPD (chronic obstructive pulmonary disease) ([place]) 07/21/2021   Atrial flutter (HCC)    Cough in adult 04/19/2021   Abnormal CT of the chest 02/23/2021   Mass of lower lobe of left lung 02/08/2021   Pulmonary infiltrate on radiologic exam 02/08/2021   PAF (paroxysmal atrial fibrillation) ([place])    Nodule of right lung 09/01/2020   Persistent atrial fibrillation ([place]) 08/24/2020   Secondary hypercoagulable state ([place]) 08/24/2020   Non compliance w medication regimen 08/04/2020   Chronic kidney disease (CKD), 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   Acute prerenal failure ([place]) 06/01/2020   Goals of care, counseling/discussion 05/14/2020   Lymphadenopathy of head and neck 03/09/2020   Pancytopenia, acquired ([place]) 03/09/2020   DOE (dyspnea on exertion) 11/09/2018   Dilated cardiomyopathy ([place]) 09/14/2018   Atypical atrial flutter (HCC)    Elevated serum creatinine 03/06/2018   Basal cell carcinoma (BCC) of scalp 03/06/2018   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   Preventive measure 06/25/2015   Hyperlipidemia 06/02/2015   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  06/02/2015   Recent upper respiratory tract infection 11/18/2014   Leukopenia due to antineoplastic chemotherapy ([place]) 10/21/2014   Thrombocytopenia ([place]) 08/19/2014   Hypersensitivity reaction 06/25/2014   At high risk of tumor lysis syndrome 06/23/2014  Anemia in chronic illness 03/14/2014   Weight loss, non-intentional 03/14/2014   Diffuse large B cell lymphoma ([place]) 09/10/2013   CAD (coronary artery disease) 08/28/2013   Hypertension    Diabetes mellitus, type II ([place])    Obesity    Hypertriglyceridemia    Abnormal cardiovascular function study 01/17/2013    Past Surgical History:  Procedure Laterality Date   CARDIOVERSION N/A 09/05/2018   Procedure: CARDIOVERSION;  Surgeon: Jerline Pain, MD;  Location: [facility];  Service:  Cardiovascular;  Laterality: N/A;   CARDIOVERSION N/A 10/22/2018   Procedure: CARDIOVERSION;  Surgeon: Jerline Pain, MD;  Location: [facility];  Service: Cardiovascular;  Laterality: N/A;   CARDIOVERSION N/A 08/28/2020   Procedure: CARDIOVERSION;  Surgeon: Josue [name], MD;  Location: [facility];  Service: Cardiovascular;  Laterality: N/A;   CARDIOVERSION N/A 11/30/2020   Procedure: CARDIOVERSION;  Surgeon: Jerline Pain, MD;  Location: [facility] ENDOSCOPY;  Service: Cardiovascular;  Laterality: N/A;   CARDIOVERSION N/A 04/22/2021   Procedure: CARDIOVERSION;  Surgeon: Buford Dresser, MD;  Location: [facility] ENDOSCOPY;  Service: Cardiovascular;  Laterality: N/A;   CHOLECYSTECTOMY N/A 07/12/2016   Procedure: LAPAROSCOPIC CHOLECYSTECTOMY;  Surgeon: Donnie Mesa, MD;  Location: [facility];  Service: General;  Laterality: N/A;   EXTRACORPOREAL SHOCK WAVE LITHOTRIPSY Right 11/18/2019   Procedure: EXTRACORPOREAL SHOCK WAVE LITHOTRIPSY (ESWL);  Surgeon: Lucas Mallow, MD;  Location[facility];  Service: Urology;  Laterality: Right;   FOOT SURGERY Right    IR IMAGING GUIDED PORT INSERTION  03/19/2021   NASAL SINUS SURGERY     TEE WITHOUT CARDIOVERSION N/A 09/05/2018   Procedure: TRANSESOPHAGEAL ECHOCARDIOGRAM (TEE);  Surgeon: Jerline Pain, MD;  Location: [facility] ENDOSCOPY;  Service: Cardiovascular;  Laterality: N/A;   TEE WITHOUT CARDIOVERSION N/A 08/28/2020   Procedure: TRANSESOPHAGEAL ECHOCARDIOGRAM (TEE);  Surgeon: Josue [name], MD;  Location: [facility] ENDOSCOPY;  Service: Cardiovascular;  Laterality: N/A;   TONSILLECTOMY     UMBILICAL HERNIA REPAIR N/A 07/12/2016   Procedure: UMBILICAL HERINA REPAIR;  Surgeon: Donnie Mesa, MD;  Location: [facility];  Service: General;  Laterality: N/A;   vericose vein stripping         Family History  Problem Relation Age of Onset   Heart attack Mother    Cancer Father        liver    Social History   Tobacco Use   Smoking status: Former     Packs/day: 3.00    Years: 10.00    Pack years: 30.00    Types: Cigarettes    Quit date: 09/19/1988    Years since quitting: 32.9   Smokeless tobacco: Never  Vaping Use   Vaping Use: Never used  Substance Use Topics   Alcohol use: Yes    Alcohol/week: 17.0 - 19.0 standard drinks    Types: 14 Shots of liquor, 3 - 5 Standard drinks or equivalent per week   Drug use: No    Home Medications Prior to Admission medications   Medication Sig Start Date End Date Taking? Authorizing Provider  amiodarone (PACERONE) 200 MG tablet Take 1 tablet (200 mg total) by mouth daily. 05/31/21  Yes Fenton, Clint R, PA  benzonatate (TESSALON) 200 MG capsule Take 1 capsule (200 mg total) by mouth 3 (three) times daily as needed for cough. 08/06/21  Yes Collene Gobble, MD  Budeson-Glycopyrrol-Formoterol (BREZTRI AEROSPHERE) 160-9-4.8 MCG/ACT AERO Inhale 2 puffs into the lungs in the morning and at bedtime. 07/21/21  Yes Collene Gobble,  MD  fluticasone (FLONASE) 50 MCG/ACT nasal spray Place 2 sprays into both nostrils daily. 08/06/21  Yes Collene Gobble, MD  glimepiride (AMARYL) 2 MG tablet Take 2 mg by mouth daily with breakfast. 08/24/20  Yes [provider]  levothyroxine (SYNTHROID) 25 MCG tablet Take 25 mcg by mouth daily before breakfast.   Yes [provider]  metoprolol succinate (TOPROL-XL) 25 MG 24 hr tablet TAKE 1/2 TABLET BY MOUTH EVERY DAY 07/30/21  Yes [name], MD  Omega-3 Fatty Acids (FISH OIL PO) Take 2 capsules by mouth daily.   Yes [provider]  ondansetron (ZOFRAN) 8 MG tablet Take 8 mg by mouth every 8 (eight) hours as needed.   Yes [provider]  predniSONE (DELTASONE) 20 MG tablet TAKE 2 TABLETS BY MOUTH DAILY. TAKE WITH FOOD ON FOR 4 DAYS AFTER CHEMOTHERAPY EVERY 3 WEEKS 08/09/21  Yes Gorsuch, Ni, MD  rosuvastatin (CRESTOR) 10 MG tablet Take 10 mg by mouth daily. 02/27/20  Yes [provider]  tamsulosin (FLOMAX) 0.4 MG CAPS capsule Take 0.4  mg by mouth daily. 03/21/20  Yes [provider]  VITAMIN D PO Take 1 capsule by mouth daily.   Yes [provider]  XARELTO 20 MG TABS tablet TAKE 1 TABLET BY MOUTH DAILY WITH SUPPER. 06/24/21  Yes Fenton, Clint R, PA  acyclovir (ZOVIRAX) 400 MG tablet TAKE 1 TABLET BY MOUTH EVERY DAY Patient not taking: Reported on 08/07/2021 06/07/21   [name], MD  allopurinol (ZYLOPRIM) 300 MG tablet TAKE 1 TABLET BY MOUTH EVERY DAY Patient not taking: Reported on 08/17/2021 07/30/21   [name], MD  doxepin (SINEQUAN) 10 MG capsule Take 10 mg by mouth at bedtime. Patient not taking: Reported on 08/03/2021    [provider]  fluticasone furoate-vilanterol (BREO ELLIPTA) 100-25 MCG/INH AEPB Inhale 1 puff into the lungs daily. Patient not taking: Reported on 08/04/2021 07/02/21   Martyn Ehrich, NP  glucose blood test strip Dispense glucometer, strips and lancets preferred by patient's insurance (DX: E11.9 / type 2 DM) 05/18/17   [provider]  lidocaine-prilocaine (EMLA) cream Apply to affected area once Patient not taking: Reported on 08/13/2021 03/23/21   [name], MD  MYRBETRIQ 50 MG TB24 tablet Take 50 mg by mouth daily. Patient not taking: Reported on 08/02/2021 08/16/21   [provider]  Needle, Disp, (HYPODERMIC NEEDLE 26GX5/8") 26G X 5/8" MISC See admin instructions. 10/23/17   [provider]  predniSONE (DELTASONE) 10 MG tablet Take 1 tablet (10 mg total) by mouth daily with breakfast. Patient not taking: Reported on 07/20/2021 07/21/21   Collene Gobble, MD  prochlorperazine (COMPAZINE) 10 MG tablet Take 1 tablet (10 mg total) by mouth every 6 (six) hours as needed for nausea or vomiting. Patient not taking: Reported on 08/15/2021 03/23/21   [name], MD    Allergies    Lipitor [atorvastatin] and Metformin  Review of Systems   Review of Systems  Constitutional:  Negative for chills and fever.  HENT:  Negative for ear pain and  sore throat.   Eyes:  Negative for pain and visual disturbance.  Respiratory:  Positive for cough and shortness of breath.   Cardiovascular:  Positive for leg swelling. Negative for chest pain and palpitations.  Gastrointestinal:  Negative for abdominal pain and vomiting.  Genitourinary:  Negative for dysuria and hematuria.  Musculoskeletal:  Negative for arthralgias and back pain.  Skin:  Negative for color change and rash.  Neurological:  Negative for seizures and syncope.  All other systems reviewed and are negative.  Physical Exam Updated Vital Signs BP 140/75   Pulse 75   Temp 97.8 F (36.6 C) (Oral)   Resp 18   SpO2 98%   Physical Exam Vitals and nursing note reviewed.  Constitutional:      General: He is not in acute distress.    Appearance: Normal appearance. He is well-developed.  HENT:     Head: Normocephalic and atraumatic.  Eyes:     Extraocular Movements: Extraocular movements intact.     Conjunctiva/sclera: Conjunctivae normal.     Pupils: Pupils are equal, round, and reactive to light.  Cardiovascular:     Rate and Rhythm: Normal rate and regular rhythm.     Heart sounds: No murmur heard. Pulmonary:     Effort: Pulmonary effort is normal. No respiratory distress.     Breath sounds: Wheezing present.  Abdominal:     Palpations: Abdomen is soft.     Tenderness: There is no abdominal tenderness.  Musculoskeletal:        General: Swelling present.     Cervical back: Normal range of motion and neck supple.     Right lower leg: Edema present.     Left lower leg: Edema present.     Comments: Trace swelling to the left lower extremity.  Some pitting edema to the right lower extremity.  No calf tenderness in either leg.  But patient complains of pain.  Skin:    General: Skin is warm and dry.     Capillary Refill: Capillary refill takes less than 2 seconds.  Neurological:     General: No focal deficit present.     Mental Status: He is alert and oriented to  person, place, and time.     Cranial Nerves: No cranial nerve deficit.     Sensory: No sensory deficit.     Motor: No weakness.  Psychiatric:        Mood and Affect: Mood normal.    ED Results / Procedures / Treatments   Labs (all labs ordered are listed, but only abnormal results are displayed) Labs Reviewed  RESP PANEL BY RT-PCR (FLU A&B, COVID) ARPGX2 - Abnormal; Notable for the following components:      Result Value   SARS Coronavirus 2 by RT PCR POSITIVE (*)    All other components within normal limits  CBC WITH DIFFERENTIAL/PLATELET - Abnormal; Notable for the following components:   WBC 2.3 (*)    RBC 4.10 (*)    Hemoglobin 11.3 (*)    HCT 35.8 (*)    RDW 16.6 (*)    Platelets 135 (*)    Neutro Abs 1.5 (*)    Lymphs Abs 0.2 (*)    Abs Immature Granulocytes 0.16 (*)    All other components within normal limits  COMPREHENSIVE METABOLIC PANEL - Abnormal; Notable for the following components:   Sodium 134 (*)    CO2 19 (*)    Glucose, Bld 190 (*)    BUN 37 (*)    Creatinine, Ser 2.13 (*)    Calcium 8.4 (*)    Total Protein 6.3 (*)    Albumin 2.7 (*)    GFR, Estimated 31 (*)    All other components within normal limits    EKG EKG Interpretation  Date/Time:  Wednesday August 18 2021 14:16:16 EST Ventricular Rate:  86 PR Interval:  231 QRS Duration: 121 QT Interval:  378 QTC Calculation: 453  R Axis:   -46 Text Interpretation: Sinus or ectopic atrial rhythm Prolonged PR interval Nonspecific IVCD with LAD Probable anteroseptal infarct, old Left anterior fascicular block Confirmed by Fredia Sorrow [phone]) on 08/09/2021 3:39:52 PM  Radiology DG Chest 2 View  Result Date: 08/17/2021 CLINICAL DATA:  Shortness of breath over the last 3 months with productive cough and dyspnea with exertion. Non-Hodgkin's lymphoma. EXAM: CHEST - 2 VIEW COMPARISON:  CT chest 08/04/2021 and chest radiograph 08/17/2021 FINDINGS: Continued substantial consolidation in the right lower  lobe, potentially from pneumonia or pulmonary lymphoma. Mild indistinct airspace opacity in the left lower lobe as well, with slight obscuration of left hemidiaphragm. Power injectable right Port-A-Cath tip: SVC. Atherosclerotic calcification of the aortic arch. Prominence of mediastinal and pericardial adipose tissues as on recent CT. Trace fluid in the right lower major fissure. A component of pleural effusion on the right is not excluded. IMPRESSION: 1. Continued consolidation in the right lower lobe. The appearance could be from pneumonia or pulmonary involvement by lymphoma. 2. Mild left lower lobe airspace opacity, nonspecific, pneumonia not excluded. 3. At least a trace right pleural effusion with fluid along the lower portion of the major fissure. 4.  Aortic Atherosclerosis (ICD10-I70.0). Electronically Signed   By: Van Clines M.D.   On: 07/29/2021 14:33   CT Chest Wo Contrast  Result Date: 07/22/2021 CLINICAL DATA:  Pneumonia.  Cough. EXAM: CT CHEST WITHOUT CONTRAST TECHNIQUE: Multidetector CT imaging of the chest was performed following the standard protocol without IV contrast. COMPARISON:  PET-CT 06/11/2021. FINDINGS: Cardiovascular: Heart and aorta are normal in size. There is no pericardial effusion. There are atherosclerotic calcifications of the aorta and coronary arteries. Right chest port catheter tip ends in the distal SVC. Superior vena cava is small in size, similar to the prior exam. Mediastinum/Nodes: The visualized thyroid gland is within normal limits. There is an enlarged subcarinal lymph node measuring 13 mm short axis similar to the prior study. Additional nonenlarged mediastinal lymph nodes are unchanged. Difficult to assess for right hilar adenopathy secondary to lack of contrast. Esophagus is unremarkable. Lungs/Pleura: There is a new moderate-sized right pleural effusion. There is some peripheral ill-defined ground-glass and solid nodular densities throughout the right  lung measuring up to 8 mm. There is compressive atelectasis and small amount of airspace disease in the right lower lobe containing air bronchograms. There is some ground-glass and interstitial opacities in the left lower lobe is well. No pneumothorax. Trachea and central airways are patent. Upper Abdomen: Gallbladder surgically absent. Limited evaluation of the upper abdomen is otherwise within normal limits. Musculoskeletal: Multilevel degenerative changes affect the spine. No acute fractures are seen. IMPRESSION: 1. New moderate-sized right pleural effusion. 2. Right lower lobe atelectasis and airspace disease worrisome for infection. 3. New diffuse right lung peripheral solid and ground-glass nodular densities, indeterminate. Findings may be infectious/inflammatory or neoplastic. 4. Ground-glass and interstitial opacities in the left lung base favors as infection/inflammation. 5. Stable enlarged subcarinal lymph node. Electronically Signed   By: Ronney Asters M.D.   On: 07/31/2021 18:43   VAS [place] LOWER EXTREMITY VENOUS (DVT) (7a-7p)  Result Date: 07/26/2021  Lower Venous DVT Study Patient Name:  Derek [name]  Date of Exam:   08/16/2021 Medical Rec #: [id number]    Accession #:    [id number] Date of Birth: 24-Aug-1941    Patient Gender: M Patient Age:   4 years Exam Location:  [facility] Procedure:      VAS [place] LOWER EXTREMITY  VENOUS (DVT) Referring Phys: Fredia Sorrow --------------------------------------------------------------------------------  Indications: Edema, and Palpable Cord.  Comparison Study: No prior study Performing Technologist: Maudry Mayhew MHA, RDMS, RVT, RDCS  Examination Guidelines: A complete evaluation includes B-mode imaging, spectral Doppler, color Doppler, and power Doppler as needed of all accessible portions of each vessel. Bilateral testing is considered an integral part of a complete examination. Limited examinations for reoccurring indications may be performed as  noted. The reflux portion of the exam is performed with the patient in reverse Trendelenburg.  +-----+---------------+---------+-----------+----------+--------------+ RIGHTCompressibilityPhasicitySpontaneityPropertiesThrombus Aging +-----+---------------+---------+-----------+----------+--------------+ CFV  Full           Yes      Yes                                 +-----+---------------+---------+-----------+----------+--------------+   +---------+---------------+---------+-----------+----------+--------------+ LEFT     CompressibilityPhasicitySpontaneityPropertiesThrombus Aging +---------+---------------+---------+-----------+----------+--------------+ CFV      Full           Yes      Yes                                 +---------+---------------+---------+-----------+----------+--------------+ SFJ      Full                                                        +---------+---------------+---------+-----------+----------+--------------+ FV Prox  Full                                                        +---------+---------------+---------+-----------+----------+--------------+ FV Mid   Full                                                        +---------+---------------+---------+-----------+----------+--------------+ FV DistalFull                                                        +---------+---------------+---------+-----------+----------+--------------+ PFV      Full                                                        +---------+---------------+---------+-----------+----------+--------------+ POP      Full           Yes      Yes                                 +---------+---------------+---------+-----------+----------+--------------+ PTV      Full                                                        +---------+---------------+---------+-----------+----------+--------------+  PERO     Full                                                         +---------+---------------+---------+-----------+----------+--------------+ Gastroc  Full                                                        +---------+---------------+---------+-----------+----------+--------------+     Summary: RIGHT: - No evidence of common femoral vein obstruction.  LEFT: - There is no evidence of deep vein thrombosis in the lower extremity.  - No cystic structure found in the popliteal fossa.  *See table(s) above for measurements and observations.    Preliminary     Procedures Procedures   Medications Ordered in ED Medications  albuterol (VENTOLIN HFA) 108 (90 Base) MCG/ACT inhaler 2 puff (has no administration in time range)  0.9 %  sodium chloride infusion ( Intravenous New Bag/Given 08/15/2021 1725)  cefTRIAXone (ROCEPHIN) 1 g in sodium chloride 0.9 % 100 mL IVPB (has no administration in time range)  azithromycin (ZITHROMAX) 500 mg in sodium chloride 0.9 % 250 mL IVPB (has no administration in time range)    ED Course  I have reviewed the triage vital signs and the nursing notes.  Pertinent labs & imaging results that were available during my care of the patient were reviewed by me and considered in my medical decision making (see chart for details).    MDM Rules/Calculators/A&P                          CRITICAL CARE Performed by: Fredia Sorrow Total critical care time: 45 minutes Critical care time was exclusive of separately billable procedures and treating other patients. Critical care was necessary to treat or prevent imminent or life-threatening deterioration. Critical care was time spent personally by me on the following activities: development of treatment plan with patient and/or surrogate as well as nursing, discussions with consultants, evaluation of patient's response to treatment, examination of patient, obtaining history from patient or surrogate, ordering and performing treatments and interventions, ordering and  review of laboratory studies, ordering and review of radiographic studies, pulse oximetry and re-evaluation of patient's condition.  Patient followed by both hematology oncology and also followed by pulmonary medicine.  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  Comes in today hypoxic after a long trip to [place].  Doppler studies to both lower extremities showed no DVTs.  Patient's renal function was not consistent with being able to get a CT angio.  No leukocytosis actually white blood cell count down a little bit at 2.3.  Differential showed an absolute neutrophil count of 1.5.  The patient does not have a fever.  Complete metabolic panel significant for a CO2 of 19 but no anion gap.  CT chest without which showed evidence of a right lower lobe pneumonia most likely and concern for left long infection as well.  Some of this is somewhat confusing based on his prior presentations but patient not normally on oxygen and currently has an oxygen requirement we will go and start him on Rocephin and Zithromax contact the  hospitalist for admission.   Final Clinical Impression(s) / ED Diagnoses Final diagnoses:  Shortness of breath  Hypoxia  Community acquired pneumonia of right lower lobe of lung  Diffuse large B-cell lymphoma, unspecified body region [facility])    Rx / DC Orders ED Discharge Orders     None        Fredia Sorrow, MD 08/17/2021 2021

## 2021-08-18 NOTE — Progress Notes (Signed)
Left lower extremity venous duplex completed. Refer to "CV Proc" under chart review to view preliminary results.  08/14/2021 7:38 PM Kelby Aline., MHA, RVT, RDCS, RDMS

## 2021-08-19 ENCOUNTER — Inpatient Hospital Stay: Payer: Medicare Other

## 2021-08-19 ENCOUNTER — Inpatient Hospital Stay: Payer: Medicare Other | Admitting: Hematology and Oncology

## 2021-08-19 ENCOUNTER — Inpatient Hospital Stay (HOSPITAL_COMMUNITY): Payer: Medicare Other

## 2021-08-19 DIAGNOSIS — J189 Pneumonia, unspecified organism: Secondary | ICD-10-CM

## 2021-08-19 DIAGNOSIS — J90 Pleural effusion, not elsewhere classified: Secondary | ICD-10-CM

## 2021-08-19 DIAGNOSIS — J9601 Acute respiratory failure with hypoxia: Secondary | ICD-10-CM | POA: Diagnosis not present

## 2021-08-19 DIAGNOSIS — D72819 Decreased white blood cell count, unspecified: Secondary | ICD-10-CM

## 2021-08-19 DIAGNOSIS — D709 Neutropenia, unspecified: Secondary | ICD-10-CM

## 2021-08-19 DIAGNOSIS — J9602 Acute respiratory failure with hypercapnia: Secondary | ICD-10-CM | POA: Diagnosis present

## 2021-08-19 DIAGNOSIS — E039 Hypothyroidism, unspecified: Secondary | ICD-10-CM

## 2021-08-19 LAB — CBC
HCT: 31.4 % — ABNORMAL LOW (ref 39.0–52.0)
Hemoglobin: 10 g/dL — ABNORMAL LOW (ref 13.0–17.0)
MCH: 27.2 pg (ref 26.0–34.0)
MCHC: 31.8 g/dL (ref 30.0–36.0)
MCV: 85.6 fL (ref 80.0–100.0)
Platelets: 119 10*3/uL — ABNORMAL LOW (ref 150–400)
RBC: 3.67 MIL/uL — ABNORMAL LOW (ref 4.22–5.81)
RDW: 16.6 % — ABNORMAL HIGH (ref 11.5–15.5)
WBC: 1.3 10*3/uL — CL (ref 4.0–10.5)
nRBC: 0 % (ref 0.0–0.2)

## 2021-08-19 LAB — BRAIN NATRIURETIC PEPTIDE: B Natriuretic Peptide: 221.4 pg/mL — ABNORMAL HIGH (ref 0.0–100.0)

## 2021-08-19 LAB — STREP PNEUMONIAE URINARY ANTIGEN: Strep Pneumo Urinary Antigen: NEGATIVE

## 2021-08-19 MED ORDER — METOPROLOL SUCCINATE ER 25 MG PO TB24
12.5000 mg | ORAL_TABLET | Freq: Every day | ORAL | Status: DC
  Administered 2021-08-19 – 2021-08-26 (×8): 12.5 mg via ORAL
  Filled 2021-08-19 (×4): qty 1
  Filled 2021-08-19: qty 0.5
  Filled 2021-08-19 (×3): qty 1

## 2021-08-19 MED ORDER — CHLORHEXIDINE GLUCONATE CLOTH 2 % EX PADS
6.0000 | MEDICATED_PAD | Freq: Every day | CUTANEOUS | Status: DC
  Administered 2021-08-20 – 2021-08-26 (×6): 6 via TOPICAL

## 2021-08-19 MED ORDER — AMIODARONE HCL 200 MG PO TABS
200.0000 mg | ORAL_TABLET | Freq: Every day | ORAL | Status: DC
  Administered 2021-08-19 – 2021-08-22 (×4): 200 mg via ORAL
  Filled 2021-08-19 (×4): qty 1

## 2021-08-19 MED ORDER — LEVOTHYROXINE SODIUM 25 MCG PO TABS
25.0000 ug | ORAL_TABLET | Freq: Every day | ORAL | Status: DC
  Administered 2021-08-19 – 2021-08-26 (×8): 25 ug via ORAL
  Filled 2021-08-19 (×8): qty 1

## 2021-08-19 MED ORDER — BUDESON-GLYCOPYRROL-FORMOTEROL 160-9-4.8 MCG/ACT IN AERO: 2.0000 | INHALATION_SPRAY | Freq: Two times a day (BID) | RESPIRATORY_TRACT | Status: DC

## 2021-08-19 MED ORDER — UMECLIDINIUM BROMIDE 62.5 MCG/ACT IN AEPB
1.0000 | INHALATION_SPRAY | Freq: Every day | RESPIRATORY_TRACT | Status: DC
  Administered 2021-08-20 – 2021-08-26 (×7): 1 via RESPIRATORY_TRACT
  Filled 2021-08-19 (×2): qty 7

## 2021-08-19 MED ORDER — MOMETASONE FURO-FORMOTEROL FUM 100-5 MCG/ACT IN AERO
2.0000 | INHALATION_SPRAY | Freq: Two times a day (BID) | RESPIRATORY_TRACT | Status: DC
  Administered 2021-08-20 – 2021-08-26 (×13): 2 via RESPIRATORY_TRACT
  Filled 2021-08-19 (×2): qty 8.8

## 2021-08-19 MED ORDER — FUROSEMIDE 10 MG/ML IJ SOLN
80.0000 mg | Freq: Two times a day (BID) | INTRAMUSCULAR | Status: DC
  Administered 2021-08-19 – 2021-08-20 (×4): 80 mg via INTRAVENOUS
  Filled 2021-08-19 (×4): qty 8

## 2021-08-19 MED ORDER — IPRATROPIUM-ALBUTEROL 0.5-2.5 (3) MG/3ML IN SOLN
3.0000 mL | Freq: Four times a day (QID) | RESPIRATORY_TRACT | Status: DC | PRN
  Administered 2021-08-22 – 2021-08-26 (×3): 3 mL via RESPIRATORY_TRACT
  Filled 2021-08-19 (×3): qty 3

## 2021-08-19 MED ORDER — ROSUVASTATIN CALCIUM 10 MG PO TABS
10.0000 mg | ORAL_TABLET | Freq: Every day | ORAL | Status: DC
  Administered 2021-08-19 – 2021-08-26 (×8): 10 mg via ORAL
  Filled 2021-08-19 (×8): qty 1

## 2021-08-19 MED ORDER — TAMSULOSIN HCL 0.4 MG PO CAPS
0.4000 mg | ORAL_CAPSULE | Freq: Every day | ORAL | Status: DC
  Administered 2021-08-19 – 2021-08-26 (×8): 0.4 mg via ORAL
  Filled 2021-08-19 (×8): qty 1

## 2021-08-19 MED ORDER — BENZONATATE 100 MG PO CAPS: 200.0000 mg | ORAL_CAPSULE | Freq: Three times a day (TID) | ORAL | Status: DC | PRN

## 2021-08-19 NOTE — Progress Notes (Addendum)
HEMATOLOGY-ONCOLOGY PROGRESS NOTE  I seen him, examined him and agree with documentation as follows  ASSESSMENT & PLAN:  Diffuse large B cell lymphoma (HCC) Overall, his performance status has declined and he has continued to have COPD exacerbation and dyspnea with minimal exertion Was seen by pulmonology who tried him on steroids with very minimal improvement Has been off steroids for about 2 weeks Due to ongoing infection and respiratory failure, he is not a candidate to receive treatment now Focus on supportive care  Acute hypoxic respiratory failure secondary to possible pneumonia/right pleural effusion Has been started on IV antibiotics per hospitalist For ultrasound-guided thoracentesis today with fluid to be sent for cytology He tested positive for COVID again I will check immunoglobulin levels If he is hypogammaglobulinemia, I will order IVIG treatment The risk, benefits, side effects of IVIG were discussed with the patient and he is in agreement to proceed   Pancytopenia Unclear if due to lymphoma versus bone marrow suppression from acute infection He is afebrile at this time and will hold off on Granix No transfusion currently indicated I will order CBC with differential daily If he becomes more neutropenic and have signs of sepsis, he will benefit from G-CSF support   chronic kidney disease (CKD), 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HCC) Creatinine overall consistent with recent baseline  We discussed the importance of risk factor modification and adequate hydration Avoid nephrotoxic medications  Paroxysmal atrial fibrillation/atrial flutter Xarelto currently on hold pending thoracentesis-okay to resume from our standpoint following procedure On amiodarone and metoprolol per hospitalist  Discharge planning Not currently stable for discharge due to ongoing dyspnea Will need thoracentesis and treatment for possible pneumonia May need input from pulmonology this admission He would  likely be here for the next 4 to 5 days Will follow   SUBJECTIVE: Derek [name] is followed by our office for diffuse large B-cell lymphoma-last visit 06/29/2021 Chemotherapy was placed on hold pending pulmonology evaluation for shortness of breath and dyspnea with exertion Attempted to reschedule the patient to resume chemotherapy, but he was in [place] Now admitted for worsening shortness of breath Tested positive for COVID-19 on admission CT chest showed new moderate size right pleural effusion, right lower lobe atelectasis and airspace disease worrisome for infection, new diffuse right lung peripheral solid and groundglass nodular densities which are indeterminate which may be infectious/inflammatory versus neoplastic, groundglass and interstitial opacities in the left lung base which favors infection/inflammation, stable enlarged subcarinal lymph node He has been started on IV antibiotics and thoracentesis has been ordered WBC noted to be low at 2.3 with ANC of 1.5  The patient was sitting up in the recliner at the time my visit He tells me that he has shortness of breath with very minimal exertion-only about 10 to 15 feet Denies fevers and chills He denies weight loss in the past month but has lost about 60 pounds in the past 6 months Denies chest pain, abdominal pain, nausea, vomiting He tells me that he has been off steroids for about 2 weeks and states that he did not notice much improvement in his breathing while taking the steroids He has progressive wheezes over the past few days and has been febrile with temperature as high as 102  Oncology History Overview Note  Normal FISH for CLL DLBCL transformed from CLL; c-myc by [facility] and other rearrangements were not detected   Diffuse large B cell lymphoma (HCC)  09/10/2013 Initial Diagnosis   CLL (chronic lymphocytic leukemia)   12/26/2013 Pathology Results  FISH analysis was normal.   06/17/2014 Procedure   The patient has placement  of Infuse-a-Port.   06/18/2014 Imaging   CT scan of the chest, abdomen and pelvis showed diffuse lymphadenopathy and splenomegaly.   06/19/2014 Bone Marrow Biopsy   Bone marrow aspirate and biopsy show CLL.   06/24/2014 - 11/18/2014 Chemotherapy   He received 6 cycles of Obinutuzumab and chlorambucil   09/15/2014 Imaging   Repeat CT scan of the chest, abdomen and pelvis show greater than 50% reduction in lymphadenopathy and splenomegaly   12/19/2014 Imaging   Repeat CT scan showed complete resolution of lymphadenopathy and splenomegaly.   04/22/2020 Imaging   CT neck 1. Cervical adenopathy involving the bilateral level 1B, right 2/3 and bilateral level 5 nodal stations. 2. Prominent to mildly enlarged upper mediastinal nodes. 3. Prominent subcentimeter right intraparotid node.     06/03/2020 -  Chemotherapy   The patient had rituximab for chemotherapy treatment.     08/31/2020 Imaging   CT neck 1. Regression of Leukemia. Resolved widespread cervical lymphadenopathy since August. Largest residual left level IIIb node now 8-9 mm short axis (previously 16 mm). 2.  CT Chest, Abdomen, and Pelvis today are reported separately.     08/31/2020 Imaging   Limited evaluation due to lack of intravenous contrast administration.   7.1 cm left perirenal soft tissue mass along the posterior left upper kidney, suspicious for perirenal lymphoma, although poorly evaluated. This is essentially new from May 2021.   Possible periureteral soft tissue along the right proximal collecting system/ureter, equivocal.   New 12 mm right lower lobe pulmonary nodule, suspicious for metastasis/pulmonary lymphoma. Additional 6 mm (mean diameter) subpleural nodule in the lingula is new from 2016, indeterminate.   No suspicious lymphadenopathy in the chest, abdomen, or pelvis. Spleen is normal in size.     02/05/2021 Imaging   1. Interval enlargement of multiple subcutaneous soft tissue nodules or lymph nodes, in the  left axilla and overlying the sacrum and left buttock. 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 2. Significant interval increase in size of right lower lobe and lingular pulmonary nodules, now with large subpleural opacities, consistent with worsened pulmonary lymphomatous involvement. 3. Interval decrease in size of a left perinephric soft tissue mass.  4. Constellation of findings above suggest mixed response to treatment. 5. 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 No clear evidence of honeycombing. 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 [place], Iss 5, [phone], May 20 2017. 6. Coronary artery disease.   02/05/2021 Pathology Results   A. 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  The appearance is diffuse with lack of atypical follicles.  Flow cytometric analysis was attempted ([id number]) but there were insufficient cells present in the sample for analysis. Hence a battery of immunohistochemical stains was performed and shows that the atypical lymphoid cells are positive for CD20, CD79a, PAX5, BCL-2, and MUM 1.  Only scattered cells  are positive for cytoplasmic kappa and not lambda.  There is patchy weak staining for BCL6.  No significant staining is seen with CD10, CD30,  CD34, CD138, cyclin D1, or EBV in situ hybridization.  Ki-67 shows variable increased expression ranging from 30% to over 50% in some areas.  There is an admixed T-cell  population in the background to a lesser extent as seen with CD3 and CD5 and there is no apparent co-expression of CD5 in B-cell areas.  The findings are most consistent with involvement by large B-cell lymphoma,  ABC type.  Given the previous history of chronic lymphocytic leukemia/small lymphocytic lymphoma, this likely represents high-grade transformation.  Clinical correlation is recommended   02/26/2021 PET scan   1. Evidence of multi organ lymphoma recurrence. 2. Enlarging foci of hypermetabolic peripheral consolidation in both lungs consistent pulmonary lymphoma. 3. Multiple foci of hypermetabolic subcutaneous nodularity consistent with subcutaneous lymphoma. 4. Solitary hypermetabolic nodal metastasis in the central mesentery and peritoneal implant along the RIGHT iliacus muscle. 5. THree foci of metabolic activity in the skeleton consistent with skeletal lymphoma.     03/08/2021 Procedure   Status post CT-guided biopsy of right lower lobe lung mass   03/12/2021 Cancer Staging   Staging form: Hodgkin and Non-Hodgkin Lymphoma, AJCC 7th Edition - Clinical stage from 03/12/2021: Stage IV - Signed by [name], MD on 03/12/2021 Staged by: Managing physician Stage prefix: Recurrence Biopsy of metastatic site performed: Yes Source of metastatic specimen: Lung    03/17/2021 Echocardiogram    1. Left ventricular ejection fraction, by estimation, is 55 to 60%. The left ventricle has normal function. The left ventricle has no regional wall motion abnormalities. There is moderate concentric left ventricular hypertrophy. Left ventricular diastolic parameters are indeterminate. The average left ventricular global longitudinal strain is -20.4 %. The global longitudinal strain is normal.  2. Right ventricular systolic function is  normal. The right ventricular size is normal. Tricuspid regurgitation signal is inadequate for assessing PA pressure.  3. The mitral valve is grossly normal. Mild mitral valve regurgitation. No evidence of mitral stenosis.  4. The aortic valve is tricuspid. There is mild calcification of the aortic valve. Aortic valve regurgitation is not visualized. Mild aortic valve sclerosis is present, with no evidence of aortic valve stenosis.  5. The inferior vena cava is normal in size with greater than 50% respiratory variability, suggesting right atrial pressure of 3 mmHg.   03/19/2021 Procedure   Successful placement of a right internal jugular approach power injectable Port-A-Cath. The catheter is ready for immediate use.     03/29/2021 -  Chemotherapy   Patient is on Treatment Plan : NON-HODGKINS LYMPHOMA R-CHOP q21d     06/10/2021 Echocardiogram    1. Left ventricular ejection fraction, by estimation, is 55 to 60%. The left ventricle has normal function. The left ventricle has no regional wall motion abnormalities. There is mild left ventricular hypertrophy. Left ventricular diastolic parameters are indeterminate.  2. Right ventricular systolic function is normal. The right ventricular size is normal.  3. The mitral valve is normal in structure. Trivial mitral valve regurgitation.  4. The aortic valve is normal in structure. Aortic valve regurgitation is not visualized. No aortic stenosis is present.   06/14/2021 PET scan   1. Significant interval positive response to therapy. Residual hypermetabolic peripheral right lower lobe lung mass, decreased in size and metabolism. Additional previously visualized hypermetabolic sites of involvement in the skeleton, subcutaneous and muscular soft tissues and mesenteric lymph nodes have resolved.  2. New patchy ground-glass opacity with associated patchy hypermetabolism throughout both lungs, nonspecific, differential includes drug reaction or atypical infection.  Consider attention on follow-up chest CT. 3. Chronic findings include: Aortic Atherosclerosis (ICD10-I70.0). Paranasal sinusitis. Right nephrolithiasis. Marked diffuse colonic diverticulosis. Coronary atherosclerosis. 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 Recommend annual imaging followup by CTA or MRA.      REVIEW OF SYSTEMS:   Eyes: Denies blurriness of vision Ears, nose, mouth, throat, and face: Denies mucositis or sore throat Respiratory: Reports cough and shortness of breath with minimal exertion Gastrointestinal:  Denies nausea, heartburn or change in bowel habits Skin: Denies abnormal skin rashes Lymphatics: Denies new lymphadenopathy or easy bruising Neurological:Denies numbness, tingling or new weaknesses Behavioral/Psych: Mood is stable, no new changes  Extremities: No lower extremity edema All other systems were reviewed with the patient and are negative.  I have reviewed the past medical history, past surgical history, social history and family history with the patient and they are unchanged from previous note.   PHYSICAL EXAMINATION: ECOG PERFORMANCE STATUS: 2 - Symptomatic, <50% confined to bed  Vitals:   08/19/21 0500 08/19/21 0600  BP: (!) 147/77 139/77  Pulse: 73 70  Resp: (!) 24 (!) 21  Temp:    SpO2: 92% 91%   There were no vitals filed for this visit.  Intake/Output from previous day: No intake/output data recorded.  GENERAL:alert, no distress and comfortable SKIN: skin color, texture, turgor are normal, no rashes or significant lesions LYMPH:  no palpable lymphadenopathy in the cervical, axillary or inguinal LUNGS: Has scattered expiratory wheezes, diminished right lung HEART: regular rate & rhythm and no murmurs and no lower extremity edema ABDOMEN:abdomen soft, non-tender and normal bowel sounds NEURO: alert & oriented x 3 with fluent speech, no focal motor/sensory deficits  LABORATORY DATA:  I have reviewed the data as listed CMP Latest Ref  Rng & Units 07/31/2021 06/29/2021 06/15/2021  Glucose 70 - 99 mg/dL 190(H) 124(H) 221(H)  BUN 8 - 23 mg/dL 37(H) 30(H) 25(H)  Creatinine 0.61 - 1.24 mg/dL 2.13(H) 2.29(H) 2.15(H)  Sodium 135 - 145 mmol/L 134(L) 135 133(L)  Potassium 3.5 - 5.1 mmol/L 4.4 4.3 4.4  Chloride 98 - 111 mmol/L 104 105 103  CO2 22 - 32 mmol/L 19(L) 19(L) 18(L)  Calcium 8.9 - 10.3 mg/dL 8.4(L) 9.2 9.3  Total Protein 6.5 - 8.1 g/dL 6.3(L) 6.1(L) 6.4(L)  Total Bilirubin 0.3 - 1.2 mg/dL 0.8 0.6 0.7  Alkaline Phos 38 - 126 U/L 62 78 139(H)  AST 15 - 41 U/L 33 12(L) 14(L)  ALT 0 - 44 U/L $Remo'15 10 12    'AEplD$ Lab Results  Component Value Date   WBC 1.3 (LL) 08/19/2021   HGB 10.0 (L) 08/19/2021   HCT 31.4 (L) 08/19/2021   MCV 85.6 08/19/2021   PLT 119 (L) 08/19/2021   NEUTROABS 1.5 (L) 08/14/2021   I have reviewed CT imaging and x-ray myself DG Chest 2 View  Result Date: 08/10/2021 CLINICAL DATA:  Shortness of breath over the last 3 months with productive cough and dyspnea with exertion. Non-Hodgkin's lymphoma. EXAM: CHEST - 2 VIEW COMPARISON:  CT chest 08/04/2021 and chest radiograph 08/17/2021 FINDINGS: Continued substantial consolidation in the right lower lobe, potentially from pneumonia or pulmonary lymphoma. Mild indistinct airspace opacity in the left lower lobe as well, with slight obscuration of left hemidiaphragm. Power injectable right Port-A-Cath tip: SVC. Atherosclerotic calcification of the aortic arch. Prominence of mediastinal and pericardial adipose tissues as on recent CT. Trace fluid  in the right lower major fissure. A component of pleural effusion on the right is not excluded. IMPRESSION: 1. Continued consolidation in the right lower lobe. The appearance could be from pneumonia or pulmonary involvement by lymphoma. 2. Mild left lower lobe airspace opacity, nonspecific, pneumonia not excluded. 3. At least a trace right pleural effusion with fluid along the lower portion of the major fissure. 4.  Aortic  Atherosclerosis (ICD10-I70.0). Electronically Signed   By: Van Clines M.D.   On: 08/16/2021 14:33   CT Chest Wo Contrast  Result Date: 07/26/2021 CLINICAL DATA:  Pneumonia.  Cough. EXAM: CT CHEST WITHOUT CONTRAST TECHNIQUE: Multidetector CT imaging of the chest was performed following the standard protocol without IV contrast. COMPARISON:  PET-CT 06/11/2021. FINDINGS: Cardiovascular: Heart and aorta are normal in size. There is no pericardial effusion. There are atherosclerotic calcifications of the aorta and coronary arteries. Right chest port catheter tip ends in the distal SVC. Superior vena cava is small in size, similar to the prior exam. Mediastinum/Nodes: The visualized thyroid gland is within normal limits. There is an enlarged subcarinal lymph node measuring 13 mm short axis similar to the prior study. Additional nonenlarged mediastinal lymph nodes are unchanged. Difficult to assess for right hilar adenopathy secondary to lack of contrast. Esophagus is unremarkable. Lungs/Pleura: There is a new moderate-sized right pleural effusion. There is some peripheral ill-defined ground-glass and solid nodular densities throughout the right lung measuring up to 8 mm. There is compressive atelectasis and small amount of airspace disease in the right lower lobe containing air bronchograms. There is some ground-glass and interstitial opacities in the left lower lobe is well. No pneumothorax. Trachea and central airways are patent. Upper Abdomen: Gallbladder surgically absent. Limited evaluation of the upper abdomen is otherwise within normal limits. Musculoskeletal: Multilevel degenerative changes affect the spine. No acute fractures are seen. IMPRESSION: 1. New moderate-sized right pleural effusion. 2. Right lower lobe atelectasis and airspace disease worrisome for infection. 3. New diffuse right lung peripheral solid and ground-glass nodular densities, indeterminate. Findings may be infectious/inflammatory  or neoplastic. 4. Ground-glass and interstitial opacities in the left lung base favors as infection/inflammation. 5. Stable enlarged subcarinal lymph node. Electronically Signed   By: Ronney Asters M.D.   On: 08/17/2021 18:43   CT CHEST WO CONTRAST  Result Date: 08/05/2021 CLINICAL DATA:  Shortness of breath for 2 months. History of CLL with chemo. Former smoker. EXAM: CT CHEST WITHOUT CONTRAST TECHNIQUE: Multidetector CT imaging of the chest was performed following the standard protocol without IV contrast. COMPARISON:  CT studies without contrast 02/05/2021 and 08/31/2020 FINDINGS: Cardiovascular: Borderline prominent heart. No pericardial effusion. Heavy three-vessel calcific CAD. Scattered calcifications in the aorta with ectatic ascending segment measuring 4.2 cm, unchanged. Upper limit of normal pulmonary trunk caliber. Mediastinum/Nodes: On the last CT there was a 1.5 cm soft tissue nodule anteriorly in the left axilla which is not seen today. There is a prominent posterior right hilar lymph node of 1.5 cm in short axis, previously 6 mm short axis. No other intrathoracic adenopathy is seen. Thyroid, trachea and esophagus are unremarkable without contrast. Lungs/Pleura: Mild features of subpleural fibrosis are again noted predominating in the lower zones, but present from the apices inferiorly. This has shown no progression. There is early subpleural microcystic honeycombing in both lateral bases. There is increased subpleural micronodular interstitial change in the anterolateral aspect left upper lobe, level of the aortic arch concerning for pneumonitis/bronchiolitis. There is interval enlargement of a large pleural-based opacity with air  bronchograms in the posterior basilar right lower lobe which is now 10.6 x 7.4 cm, previously 7.1 x 4.4 cm. The previously noted pleural-based mass in the lateral base of the lingula has improved however , now measuring 2.1 x 1.5 cm, previously 3.3 x 1.8 cm. There is  new hazy interstitial change in the anterior basal left lower lobe concerning for pneumonitis as well as peribronchovascular ground-glass and micronodular disease in the lateral aspect left lower lobe on series 8 axial images 87-90. Rest of the lungs are generally clear. There is no pleural effusion or pneumothorax. Musculoskeletal: There is osteopenia and degenerative changes of the spine. No suspicious chest wall lesions or destructive bone lesions. Right chest port with IJ approach catheter again noted with the tip in the SVC. Upper abdomen: 8 mm calculus again noted in the dependent right renal pelvis, with atrophic kidneys and moderate perinephric stranding as well as small nonobstructive caliceal stones in the right kidney. Soft tissue thickening posterior to left kidney now measures 3.7 x 1.4 cm with increased fatty lucency, previously 4.7 x 2.0 cm. IMPRESSION: 1. Enlarging area of consolidation with air bronchograms in the posterior basal right lower lobe but decreased size of the pre-existing lingular nodule and of a posterior left perinephric soft tissue mass. 2. Increased prominence of a single posterior right hilar lymph node of 1.6 cm short axis previously 0.6 cm without further adenopathy visible without contrast. 3. Areas of increased opacity in the left upper and lower lobes concerning for interval pneumonitis. Follow-up as indicated. 4. Basal-predominant subpleural fibrosis. 5. Coronary artery and aortic atherosclerosis. Borderline cardiomegaly. Electronically Signed   By: Telford Nab M.D.   On: 08/05/2021 02:15   VAS [place] LOWER EXTREMITY VENOUS (DVT) (7a-7p)  Result Date: 08/06/2021  Lower Venous DVT Study Patient Name:  Derek [name]  Date of Exam:   07/20/2021 Medical Rec #: [id number]    Accession #:    [id number] Date of Birth: 1941/07/12    Patient Gender: M Patient Age:   80 years Exam Location:  [facility] Procedure:      VAS [place] LOWER EXTREMITY VENOUS (DVT) Referring Phys: Fredia Sorrow --------------------------------------------------------------------------------  Indications: Edema, and Palpable Cord.  Comparison Study: No prior study Performing Technologist: Maudry Mayhew MHA, RDMS, RVT, RDCS  Examination Guidelines: A complete evaluation includes B-mode imaging, spectral Doppler, color Doppler, and power Doppler as needed of all accessible portions of each vessel. Bilateral testing is considered an integral part of a complete examination. Limited examinations for reoccurring indications may be performed as noted. The reflux portion of the exam is performed with the patient in reverse Trendelenburg.  +-----+---------------+---------+-----------+----------+--------------+ RIGHTCompressibilityPhasicitySpontaneityPropertiesThrombus Aging +-----+---------------+---------+-----------+----------+--------------+ CFV  Full           Yes      Yes                                 +-----+---------------+---------+-----------+----------+--------------+   +---------+---------------+---------+-----------+----------+--------------+ LEFT     CompressibilityPhasicitySpontaneityPropertiesThrombus Aging +---------+---------------+---------+-----------+----------+--------------+ CFV      Full           Yes      Yes                                 +---------+---------------+---------+-----------+----------+--------------+ SFJ      Full                                                        +---------+---------------+---------+-----------+----------+--------------+  FV Prox  Full                                                        +---------+---------------+---------+-----------+----------+--------------+ FV Mid   Full                                                        +---------+---------------+---------+-----------+----------+--------------+ FV DistalFull                                                         +---------+---------------+---------+-----------+----------+--------------+ PFV      Full                                                        +---------+---------------+---------+-----------+----------+--------------+ POP      Full           Yes      Yes                                 +---------+---------------+---------+-----------+----------+--------------+ PTV      Full                                                        +---------+---------------+---------+-----------+----------+--------------+ PERO     Full                                                        +---------+---------------+---------+-----------+----------+--------------+ Gastroc  Full                                                        +---------+---------------+---------+-----------+----------+--------------+     Summary: RIGHT: - No evidence of common femoral vein obstruction.  LEFT: - There is no evidence of deep vein thrombosis in the lower extremity.  - No cystic structure found in the popliteal fossa.  *See table(s) above for measurements and observations.    Preliminary

## 2021-08-19 NOTE — H&P (Signed)
History and Physical    Derek [name] FWY:[id number] DOB: [birth year] DOA: 08/16/2021  PCP: Wenda Low, MD  Patient coming from: Home  I have personally briefly reviewed patient's old medical records in [place]  Chief Complaint: Increasing shortness of breath  HPI: Derek [name] is a 80 y.o. 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  Patient has been noticing increasing shortness of breath for at least the past 3 months.  Had PET scan in September showing residual hypermetabolic peripheral right lower lobe lung mass that is decreasing in size, however there was a  new patchy groundglass opacity in both lungs concerning for drug reaction versus atypical infection.  Has been following with pulmonology who thinks his persistent shortness of breath could be pneumonitis related to chemotherapy, immunotherapy and possible long COVID.  Had some recent adjustment to bronchodilator treatment.  He has been chronically on 20 mg of prednisone started by oncology in October.  He last saw pulmonology on 11/2 and was recommended to continue prednisone with plans to taper in the near future.  Has noted fever of 103 F once last week that resolved following antipyretic.  He was diagnosed with COVID-19 back in February and continues to be positive.  Patient has been off chemotherapy for the past 3 months due to persistent respiratory issues and possible infection.  Reports 60 pound weight loss in the past 8 months due to decreased appetite.  ED Course: He was afebrile, normotensive and was hypoxic down to 85% on room air.  Placed on 4 L via nasal cannula. COVID-19 PCR remains positive Leukopenia 2.3 with left shift and absolute neutrophil count of 4.5 Platelet of 135,  hemoglobin of 5.3  Sodium 134, K of 4, creatinine of 2.13 which is around his baseline  CT chest showed new moderate size right pleural effusion and diffuse right lung peripheral solid and groundglass nodular density representing infectious versus neoplastic.   He was started on IV Rocephin and azithromycin for pneumonia and hospitalist called for admission.  Review of Systems: Constitutional: + Weight Change, No Fever ENT/Mouth: No sore throat, No Rhinorrhea Eyes: No Vision Changes Cardiovascular: No Chest Pain, + SOB, No PND, No Dyspnea on Exertion, No Orthopnea, , No Edema,  Respiratory: + Cough, + Sputum, No Wheezing, no Dyspnea  Gastrointestinal: No Nausea, No Vomiting, No Diarrhea, No Constipation, No Pain Genitourinary: no Urinary Incontinence Musculoskeletal: No Arthralgias, No Myalgias Skin: No Skin Lesions, No Pruritus, Neuro: no Weakness, No Numbness,  No Loss of Consciousness, No Syncope Psych: +decrease appetite Heme/Lymph: No Bruising, No Bleeding  Past Medical History:  Diagnosis Date   Blood dyscrasia    cll remission   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   GERD (gastroesophageal reflux disease)    occ tums   H/O cardiac catheterization    H/O exercise stress test 1999, 2002   Hypertension    Hypertriglyceridemia    Lymphocytosis    Obesity    Pulmonary Function Test 09/2018   PFTs 09/2018:  FEV1 88% predicted; FEV1/FVC 78%; DLCO cor 58    Past Surgical History:  Procedure Laterality Date   CARDIOVERSION N/A  09/05/2018   Procedure: CARDIOVERSION;  Surgeon: Jerline Pain, MD;  Location: [place];  Service: Cardiovascular;  Laterality: N/A;   CARDIOVERSION N/A 10/22/2018   Procedure: CARDIOVERSION;  Surgeon: Jerline Pain, MD;  Location: [place];  Service:  Cardiovascular;  Laterality: N/A;   CARDIOVERSION N/A 08/28/2020   Procedure: CARDIOVERSION;  Surgeon: Josue [name], MD;  Location: [place];  Service: Cardiovascular;  Laterality: N/A;   CARDIOVERSION N/A 11/30/2020   Procedure: CARDIOVERSION;  Surgeon: Jerline Pain, MD;  Location: [facility] ENDOSCOPY;  Service: Cardiovascular;  Laterality: N/A;   CARDIOVERSION N/A 04/22/2021   Procedure: CARDIOVERSION;  Surgeon: Buford Dresser, MD;  Location: [facility] ENDOSCOPY;  Service: Cardiovascular;  Laterality: N/A;   CHOLECYSTECTOMY N/A 07/12/2016   Procedure: LAPAROSCOPIC CHOLECYSTECTOMY;  Surgeon: Donnie Mesa, MD;  Location: [place];  Service: General;  Laterality: N/A;   EXTRACORPOREAL SHOCK WAVE LITHOTRIPSY Right 11/18/2019   Procedure: EXTRACORPOREAL SHOCK WAVE LITHOTRIPSY (ESWL);  Surgeon: Lucas Mallow, MD;  Location: [facility];  Service: Urology;  Laterality: Right;   FOOT SURGERY Right    IR IMAGING GUIDED PORT INSERTION  03/19/2021   NASAL SINUS SURGERY     TEE WITHOUT CARDIOVERSION N/A 09/05/2018   Procedure: TRANSESOPHAGEAL ECHOCARDIOGRAM (TEE);  Surgeon: Jerline Pain, MD;  Location: [facility] ENDOSCOPY;  Service: Cardiovascular;  Laterality: N/A;   TEE WITHOUT CARDIOVERSION N/A 08/28/2020   Procedure: TRANSESOPHAGEAL ECHOCARDIOGRAM (TEE);  Surgeon: Josue [name], MD;  Location: [facility] ENDOSCOPY;  Service: Cardiovascular;  Laterality: N/A;   TONSILLECTOMY     UMBILICAL HERNIA REPAIR N/A 07/12/2016   Procedure: UMBILICAL HERINA REPAIR;  Surgeon: Donnie Mesa, MD;  Location: [place];  Service: General;  Laterality: N/A;   vericose vein stripping       reports that he quit smoking about 32 years ago. His smoking use included cigarettes. He has a 30.00 pack-year smoking history. He has never used smokeless tobacco. He reports current alcohol use of about 17.0 - 19.0 standard drinks per week. He reports that he does not use drugs. Social History  Allergies  Allergen Reactions    Lipitor [Atorvastatin] Rash   Metformin Diarrhea    Family History  Problem Relation Age of Onset   Heart attack Mother    Cancer Father        liver     Prior to Admission medications   Medication Sig Start Date End Date Taking? Authorizing Provider  amiodarone (PACERONE) 200 MG tablet Take 1 tablet (200 mg total) by mouth daily. 05/31/21  Yes Fenton, Clint R, PA  benzonatate (TESSALON) 200 MG capsule Take 1 capsule (200 mg total) by mouth 3 (three) times daily as needed for cough. 08/06/21  Yes Collene Gobble, MD  Budeson-Glycopyrrol-Formoterol (BREZTRI AEROSPHERE) 160-9-4.8 MCG/ACT AERO Inhale 2 puffs into the lungs in the morning and at bedtime. 07/21/21  Yes Collene Gobble, MD  fluticasone (FLONASE) 50 MCG/ACT nasal spray Place 2 sprays into both nostrils daily. 08/06/21  Yes Collene Gobble, MD  glimepiride (AMARYL) 2 MG tablet Take 2 mg by mouth daily with breakfast. 08/24/20  Yes [provider]  levothyroxine (SYNTHROID) 25 MCG tablet Take 25 mcg by mouth daily before breakfast.   Yes [provider]  metoprolol succinate (TOPROL-XL) 25 MG 24 hr tablet TAKE 1/2 TABLET BY MOUTH EVERY DAY 07/30/21  Yes [name], MD  Omega-3 Fatty Acids (FISH OIL PO) Take 2 capsules by mouth daily.   Yes [provider]  ondansetron (  ZOFRAN) 8 MG tablet Take 8 mg by mouth every 8 (eight) hours as needed.   Yes [provider]  predniSONE (DELTASONE) 20 MG tablet TAKE 2 TABLETS BY MOUTH DAILY. TAKE WITH FOOD ON FOR 4 DAYS AFTER CHEMOTHERAPY EVERY 3 WEEKS 08/09/21  Yes Gorsuch, Ni, MD  rosuvastatin (CRESTOR) 10 MG tablet Take 10 mg by mouth daily. 02/27/20  Yes [provider]  tamsulosin (FLOMAX) 0.4 MG CAPS capsule Take 0.4 mg by mouth daily. 03/21/20  Yes [provider]  VITAMIN D PO Take 1 capsule by mouth daily.   Yes [provider]  XARELTO 20 MG TABS tablet TAKE 1 TABLET BY MOUTH DAILY WITH SUPPER. 06/24/21  Yes Fenton, Clint R, PA   acyclovir (ZOVIRAX) 400 MG tablet TAKE 1 TABLET BY MOUTH EVERY DAY Patient not taking: Reported on 07/27/2021 06/07/21   [name], MD  allopurinol (ZYLOPRIM) 300 MG tablet TAKE 1 TABLET BY MOUTH EVERY DAY Patient not taking: Reported on 08/11/2021 07/30/21   [name], MD  doxepin (SINEQUAN) 10 MG capsule Take 10 mg by mouth at bedtime. Patient not taking: Reported on 07/21/2021    [provider]  fluticasone furoate-vilanterol (BREO ELLIPTA) 100-25 MCG/INH AEPB Inhale 1 puff into the lungs daily. Patient not taking: Reported on 07/28/2021 07/02/21   Martyn Ehrich, NP  glucose blood test strip Dispense glucometer, strips and lancets preferred by patient's insurance (DX: E11.9 / type 2 DM) 05/18/17   [provider]  lidocaine-prilocaine (EMLA) cream Apply to affected area once Patient not taking: Reported on 08/08/2021 03/23/21   [name], MD  MYRBETRIQ 50 MG TB24 tablet Take 50 mg by mouth daily. Patient not taking: Reported on 08/04/2021 08/16/21   [provider]  Needle, Disp, (HYPODERMIC NEEDLE 26GX5/8") 26G X 5/8" MISC See admin instructions. 10/23/17   [provider]  predniSONE (DELTASONE) 10 MG tablet Take 1 tablet (10 mg total) by mouth daily with breakfast. Patient not taking: Reported on 08/02/2021 07/21/21   Collene Gobble, MD  prochlorperazine (COMPAZINE) 10 MG tablet Take 1 tablet (10 mg total) by mouth every 6 (six) hours as needed for nausea or vomiting. Patient not taking: Reported on 07/21/2021 03/23/21   [name], MD    Physical Exam: Vitals:   08/06/2021 1830 07/28/2021 1900 08/15/2021 2000 08/17/2021 2040  BP: 139/69 140/75 (!) 145/76 136/68  Pulse: 71 75 71 73  Resp: 16 18 17  (!) 21  Temp:      TempSrc:      SpO2: 91% 98% 92% (!) 89%    Constitutional: NAD, calm, comfortable, chronically ill-appearing elderly male laying flat in bed asleep Vitals:   08/05/2021 1830 08/08/2021 1900 08/09/2021 2000 08/16/2021 2040  BP: 139/69  140/75 (!) 145/76 136/68  Pulse: 71 75 71 73  Resp: 16 18 17  (!) 21  Temp:      TempSrc:      SpO2: 91% 98% 92% (!) 89%   Eyes: lids and conjunctivae normal ENMT: Mucous membranes are moist.  Neck: normal, supple, no masses, no thyromegaly Respiratory: Diffuse rhonchi on 4 L via nasal cannula.  No labored respiration.  Able to speak in full sentences. Cardiovascular: Regular rate and rhythm, no murmurs / rubs / gallops. No extremity edema.   Abdomen: no tenderness, no masses palpated.  Bowel sounds positive.  Musculoskeletal: no clubbing / cyanosis. No joint deformity upper and lower extremities. Good ROM, no contractures. Normal muscle tone.  Skin: no rashes, lesions, ulcers. No  induration Neurologic: CN 2-12 grossly intact. Strength 5/5 in all 4.  Psychiatric: Normal judgment and insight. Alert and oriented x 3. Normal mood.     Labs on Admission: I have personally reviewed following labs and imaging studies  CBC: Recent Labs  Lab 07/24/2021 1551  WBC 2.3*  NEUTROABS 1.5*  HGB 11.3*  HCT 35.8*  MCV 87.3  PLT 824*   Basic Metabolic Panel: Recent Labs  Lab 08/17/2021 1551  NA 134*  K 4.4  CL 104  CO2 19*  GLUCOSE 190*  BUN 37*  CREATININE 2.13*  CALCIUM 8.4*   GFR: CrCl cannot be calculated (Unknown ideal weight.). Liver Function Tests: Recent Labs  Lab 08/17/2021 1551  AST 33  ALT 15  ALKPHOS 62  BILITOT 0.8  PROT 6.3*  ALBUMIN 2.7*   No results for input(s): LIPASE, AMYLASE in the last 168 hours. No results for input(s): AMMONIA in the last 168 hours. Coagulation Profile: No results for input(s): INR, PROTIME in the last 168 hours. Cardiac Enzymes: No results for input(s): CKTOTAL, CKMB, CKMBINDEX, TROPONINI in the last 168 hours. BNP (last 3 results) No results for input(s): PROBNP in the last 8760 hours. HbA1C: No results for input(s): HGBA1C in the last 72 hours. CBG: No results for input(s): GLUCAP in the last 168 hours. Lipid Profile: No  results for input(s): CHOL, HDL, LDLCALC, TRIG, CHOLHDL, LDLDIRECT in the last 72 hours. Thyroid Function Tests: No results for input(s): TSH, T4TOTAL, FREET4, T3FREE, THYROIDAB in the last 72 hours. Anemia Panel: No results for input(s): VITAMINB12, FOLATE, FERRITIN, TIBC, IRON, RETICCTPCT in the last 72 hours. Urine analysis: No results found for: COLORURINE, APPEARANCEUR, LABSPEC, [place], GLUCOSEU, HGBUR, BILIRUBINUR, KETONESUR, PROTEINUR, UROBILINOGEN, NITRITE, LEUKOCYTESUR  Radiological Exams on Admission: DG Chest 2 View  Result Date: 08/14/2021 CLINICAL DATA:  Shortness of breath over the last 3 months with productive cough and dyspnea with exertion. Non-Hodgkin's lymphoma. EXAM: CHEST - 2 VIEW COMPARISON:  CT chest 08/04/2021 and chest radiograph 08/17/2021 FINDINGS: Continued substantial consolidation in the right lower lobe, potentially from pneumonia or pulmonary lymphoma. Mild indistinct airspace opacity in the left lower lobe as well, with slight obscuration of left hemidiaphragm. Power injectable right Port-A-Cath tip: SVC. Atherosclerotic calcification of the aortic arch. Prominence of mediastinal and pericardial adipose tissues as on recent CT. Trace fluid in the right lower major fissure. A component of pleural effusion on the right is not excluded. IMPRESSION: 1. Continued consolidation in the right lower lobe. The appearance could be from pneumonia or pulmonary involvement by lymphoma. 2. Mild left lower lobe airspace opacity, nonspecific, pneumonia not excluded. 3. At least a trace right pleural effusion with fluid along the lower portion of the major fissure. 4.  Aortic Atherosclerosis (ICD10-I70.0). Electronically Signed   By: Van Clines M.D.   On: 08/17/2021 14:33   CT Chest Wo Contrast  Result Date: 07/24/2021 CLINICAL DATA:  Pneumonia.  Cough. EXAM: CT CHEST WITHOUT CONTRAST TECHNIQUE: Multidetector CT imaging of the chest was performed following the standard  protocol without IV contrast. COMPARISON:  PET-CT 06/11/2021. FINDINGS: Cardiovascular: Heart and aorta are normal in size. There is no pericardial effusion. There are atherosclerotic calcifications of the aorta and coronary arteries. Right chest port catheter tip ends in the distal SVC. Superior vena cava is small in size, similar to the prior exam. Mediastinum/Nodes: The visualized thyroid gland is within normal limits. There is an enlarged subcarinal lymph node measuring 13 mm short axis similar to the prior study. Additional nonenlarged  mediastinal lymph nodes are unchanged. Difficult to assess for right hilar adenopathy secondary to lack of contrast. Esophagus is unremarkable. Lungs/Pleura: There is a new moderate-sized right pleural effusion. There is some peripheral ill-defined ground-glass and solid nodular densities throughout the right lung measuring up to 8 mm. There is compressive atelectasis and small amount of airspace disease in the right lower lobe containing air bronchograms. There is some ground-glass and interstitial opacities in the left lower lobe is well. No pneumothorax. Trachea and central airways are patent. Upper Abdomen: Gallbladder surgically absent. Limited evaluation of the upper abdomen is otherwise within normal limits. Musculoskeletal: Multilevel degenerative changes affect the spine. No acute fractures are seen. IMPRESSION: 1. New moderate-sized right pleural effusion. 2. Right lower lobe atelectasis and airspace disease worrisome for infection. 3. New diffuse right lung peripheral solid and ground-glass nodular densities, indeterminate. Findings may be infectious/inflammatory or neoplastic. 4. Ground-glass and interstitial opacities in the left lung base favors as infection/inflammation. 5. Stable enlarged subcarinal lymph node. Electronically Signed   By: Ronney Asters M.D.   On: 08/17/2021 18:43   VAS [place] LOWER EXTREMITY VENOUS (DVT) (7a-7p)  Result Date: 08/02/2021  Lower  Venous DVT Study Patient Name:  ADONYS WILDES  Date of Exam:   07/26/2021 Medical Rec #: [id number]    Accession #:    [id number] Date of Birth: 1941-06-22    Patient Gender: M Patient Age:   78 years Exam Location:  [facility] Procedure:      VAS [place] LOWER EXTREMITY VENOUS (DVT) Referring Phys: Fredia Sorrow --------------------------------------------------------------------------------  Indications: Edema, and Palpable Cord.  Comparison Study: No prior study Performing Technologist: Maudry Mayhew MHA, RDMS, RVT, RDCS  Examination Guidelines: A complete evaluation includes B-mode imaging, spectral Doppler, color Doppler, and power Doppler as needed of all accessible portions of each vessel. Bilateral testing is considered an integral part of a complete examination. Limited examinations for reoccurring indications may be performed as noted. The reflux portion of the exam is performed with the patient in reverse Trendelenburg.  +-----+---------------+---------+-----------+----------+--------------+ RIGHTCompressibilityPhasicitySpontaneityPropertiesThrombus Aging +-----+---------------+---------+-----------+----------+--------------+ CFV  Full           Yes      Yes                                 +-----+---------------+---------+-----------+----------+--------------+   +---------+---------------+---------+-----------+----------+--------------+ LEFT     CompressibilityPhasicitySpontaneityPropertiesThrombus Aging +---------+---------------+---------+-----------+----------+--------------+ CFV      Full           Yes      Yes                                 +---------+---------------+---------+-----------+----------+--------------+ SFJ      Full                                                        +---------+---------------+---------+-----------+----------+--------------+ FV Prox  Full                                                         +---------+---------------+---------+-----------+----------+--------------+ FV  Mid   Full                                                        +---------+---------------+---------+-----------+----------+--------------+ FV DistalFull                                                        +---------+---------------+---------+-----------+----------+--------------+ PFV      Full                                                        +---------+---------------+---------+-----------+----------+--------------+ POP      Full           Yes      Yes                                 +---------+---------------+---------+-----------+----------+--------------+ PTV      Full                                                        +---------+---------------+---------+-----------+----------+--------------+ PERO     Full                                                        +---------+---------------+---------+-----------+----------+--------------+ Gastroc  Full                                                        +---------+---------------+---------+-----------+----------+--------------+     Summary: RIGHT: - No evidence of common femoral vein obstruction.  LEFT: - There is no evidence of deep vein thrombosis in the lower extremity.  - No cystic structure found in the popliteal fossa.  *See table(s) above for measurements and observations.    Preliminary       Assessment/Plan  Acute hypoxic respiratory failure secondary to possible pneumonia/right pleural effusion Hx of COPD, pneumonitis from COVID, chemotherapy -CT chest showed a new moderate size right pleural effusion with diffuse right lung peripheral solid and groundglass nodularity concerning for infectious versus neoplasm -Continue IV Rocephin and azithromycin - Obtain Legionella, strep urine antigen, sputum culture -need pulmonary consult as he has had a very complicated course and being followed by them closely  in the past several months. Sees Dr [name]   Moderate Right pleural effusion possible parapneumonia effusion Thoracentesis with fluid study order  Leukopenia Neutropenia  Thrombocytopenia Hx of large B cell lymphoma Afebrile. WBC of 2.3, ANC of 1.5 - suspect due to new infection. He  has actually been off of chemotherapy for past 3 months due to persistent dyspnea Needs oncology consult. Follows with Dr. Alvy Bimler oncology  Paroxysmal atrial fibrillation/atrial flutter - Hold Xarelto for thoracentesis tomorrow -Continue amiodarone, metoprolol  CAD -Continue statin, beta-blocker  Type 2 diabetes Controlled Monitor with routine labs  Hypothyroidism Continue levothyroxine  CKD stage IV - Creatinine stable at 2.13  DVT prophylaxis:.Holding Xarelto  code Status: Full Family Communication: Plan discussed with patient at bedside  disposition Plan: Home with at least 2 midnight stays  Consults called:  Admission status: inpatient    Level of care: Telemetry  Status is: Inpatient  Remains inpatient appropriate because: Acute hypoxia requiring supplemental oxygen         Angela Burke Jyles Sontag DO [facility]   If 7PM-7AM, please contact night-coverage [email or web address]   08/17/2021, 11:17 PM

## 2021-08-19 NOTE — Progress Notes (Signed)
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  Dyspnea has been going on for last 3 months.  He follows with pulmonology.  He is on 20 mg of prednisone chronically as per oncology.  His last appointment with pulmonology was on 11/2.  Patient's reported fever of 103 Fahrenheit at home.  He was diagnosed with COVID back in February but continues to be positive.  He is off chemotherapy for the last 3 months due to persistent respiratory issues, history of recent weight loss.  On presentation he was afebrile, normotensive but hypoxic on room air with 85% saturation.  He was placed on 4 L of nasal cannula.  Lab work showed pancytopenia .CT chest showed new moderate right pleural effusion, diffuse right peripheral solid and groundglass nodular densities representing infectious versus neoplastic process.  Started on azithromycin and ceftriaxone for pneumonia.  Also started on Lasix IV for severe volume overload, echocardiogram ordered to rule out CHF.  Assessment & Plan:   Principal Problem:   Acute respiratory failure with hypoxia (HCC) Active Problems:   Diabetes mellitus, type II (HCC)   Diffuse large B cell lymphoma (HCC)   Thrombocytopenia (HCC)   Chronic kidney disease (CKD), 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HCC)   PAF (paroxysmal atrial fibrillation) (HCC)   COPD (chronic obstructive pulmonary disease) ([place])   Community acquired pneumonia   Pleural effusion   Leukopenia   Neutropenia (HCC)   Hypothyroidism   Acute hypoxic respiratory failure: Hypoxia on presentation.  Thought to be secondary to possible right-sided  pneumonia/right-sided pleural effusion.  Also has history of COPD, history of pneumonitis from COVID/chemotherapy.  CT chest also showed diffuse right peripheral solid and groundglass nodular densities representing infectious versus neoplastic process. Started on management for community-acquired pneumonia.  Currently on Rocephin, azithromycin.  Follow-up sputum culture, Legionella, streptococcal urine antigen. He follows with Dr. [name].  He is not on home oxygen.  We will try to taper the oxygen  Moderate right-sided pleural effusion: We have ordered ultrasound-guided thoracentesis.  Check pleural fluid analysis.  Volume overload: Has severe bilateral lower extremity edema.  Will check BNP, starting Lasix 80 mg IV twice a day.  We will check echocardiogram.  Needs to rule out chemotherapy-induced cardiomyopathy.  Pancytopenia: Secondary to history of large B-cell lymphoma/chemotherapy.  Currently afebrile, ANC of 1.5 on presentation.  Pancytopenia worsened today.  Oncology does not recommend Granix He follows with Dr. Alvy Bimler.  Oncology following here  History of paroxysmal A. fib/a flutter: On Xarelto which will be continued.  Also on amiodarone and metoprolol.  History of coronary artery disease: On statin, beta-blocker  Diabetes type 2: Monitor blood sugars.  Continue sliding's insulin for now.  Hypothyroidism: On levothyroxine  CKD stage IV: Currently creatinine at baseline.  Continue to monitor.  Avoid nephrotoxins  Debility/deconditioning/weakness: Has been very weak recently.  We will request for PT/OT evaluation.  Lives with his son at home         DVT prophylaxis:Xarelto Code Status: Full Family Communication: None at bedside Patient status:Inpatient  Dispo: The patient is from: Home              Anticipated d/c is to: Home  Anticipated d/c date is: in 2-3 days  Consultants: Oncology  Procedures:None  Antimicrobials:  Anti-infectives (From admission,  onward)    Start     Dose/Rate Route Frequency Ordered Stop   08/19/21 2200  cefTRIAXone (ROCEPHIN) 2 g in sodium chloride 0.9 % 100 mL IVPB        2 g 200 mL/hr over 30 Minutes Intravenous Every 24 hours 07/27/2021 2314 08/24/21 2159   08/19/21 2000  azithromycin (ZITHROMAX) 500 mg in sodium chloride 0.9 % 250 mL IVPB        500 mg 250 mL/hr over 60 Minutes Intravenous Every 24 hours 08/14/2021 2314 08/24/21 1959   08/08/2021 2030  cefTRIAXone (ROCEPHIN) 1 g in sodium chloride 0.9 % 100 mL IVPB        1 g 200 mL/hr over 30 Minutes Intravenous  Once 07/30/2021 2018 08/16/2021 2117   07/20/2021 2030  azithromycin (ZITHROMAX) 500 mg in sodium chloride 0.9 % 250 mL IVPB        500 mg 250 mL/hr over 60 Minutes Intravenous  Once 08/13/2021 2018 07/29/2021 2321       Subjective:  Patient seen and examined at bedside this morning.  Sitting on the chair in the emergency department.  On 4 L of oxygen.  He says she feels better.  Looks very ill looking, weak.  Has severe bilateral extremity edema.  But not in acute distress  Objective: Vitals:   08/19/21 0200 08/19/21 0400 08/19/21 0500 08/19/21 0600  BP: (!) 145/70 (!) 142/71 (!) 147/77 139/77  Pulse: 67 72 73 70  Resp: (!) 28 (!) 37 (!) 24 (!) 21  Temp:      TempSrc:      SpO2: 95% 90% 92% 91%   No intake or output data in the 24 hours ending 08/19/21 0819 There were no vitals filed for this visit.  Examination:   General exam: Chronically ill looking, weak, deconditioned, morbidly obese HEENT: PERRL Respiratory system: Diminished air sounds bilaterally, basilar crackles Cardiovascular system: S1 & S2 heard, RRR.  Chemo-Port on the right chest Gastrointestinal system: Abdomen is distended, soft and nontender. Central nervous system: Alert and oriented Extremities: 2-3+ bilateral lower extremity pitting edema, no clubbing ,no cyanosis Skin: No rashes, no ulcers,no icterus    Data Reviewed: I have personally reviewed following labs and imaging  studies  CBC: Recent Labs  Lab 08/17/2021 1551 08/19/21 0434  WBC 2.3* 1.3*  NEUTROABS 1.5*  --   HGB 11.3* 10.0*  HCT 35.8* 31.4*  MCV 87.3 85.6  PLT 135* 811*   Basic Metabolic Panel: Recent Labs  Lab 08/02/2021 1551  NA 134*  K 4.4  CL 104  CO2 19*  GLUCOSE 190*  BUN 37*  CREATININE 2.13*  CALCIUM 8.4*   GFR: CrCl cannot be calculated (Unknown ideal weight.). Liver Function Tests: Recent Labs  Lab 08/09/2021 1551  AST 33  ALT 15  ALKPHOS 62  BILITOT 0.8  PROT 6.3*  ALBUMIN 2.7*   No results for input(s): LIPASE, AMYLASE in the last 168 hours. No results for input(s): AMMONIA in the last 168 hours. Coagulation Profile: No results for input(s): INR, PROTIME in the last 168 hours. Cardiac Enzymes: No results for input(s): CKTOTAL, CKMB, CKMBINDEX, TROPONINI in the last 168 hours. BNP (last 3 results) No results for input(s): PROBNP in the last 8760 hours. HbA1C: No results for input(s): HGBA1C in the last 72 hours. CBG: No results for input(s): GLUCAP in the last 168 hours. Lipid Profile: No  results for input(s): CHOL, HDL, LDLCALC, TRIG, CHOLHDL, LDLDIRECT in the last 72 hours. Thyroid Function Tests: No results for input(s): TSH, T4TOTAL, FREET4, T3FREE, THYROIDAB in the last 72 hours. Anemia Panel: No results for input(s): VITAMINB12, FOLATE, FERRITIN, TIBC, IRON, RETICCTPCT in the last 72 hours. Sepsis Labs: Recent Labs  Lab 08/06/2021 2206  LATICACIDVEN 1.3    Recent Results (from the past 240 hour(s))  Resp Panel by RT-PCR (Flu A&B, Covid) Nasopharyngeal Swab     Status: Abnormal   Collection Time: 08/08/2021  3:52 PM   Specimen: Nasopharyngeal Swab; Nasopharyngeal(NP) swabs in vial transport medium  Result Value Ref Range Status   SARS Coronavirus 2 by RT PCR POSITIVE (A) NEGATIVE Final    Comment: CRITICAL RESULT CALLED TO, READ BACK BY AND VERIFIED WITH: RN N WHEATLEY AT 1957 08/17/2021 CRU[place]ANK A (NOTE) SARS-CoV-2 target nucleic acids are  DETECTED.  The SARS-CoV-2 RNA is generally detectable in upper respiratory specimens during the acute phase of infection. Positive results are indicative of the presence of the identified virus, but do not rule out bacterial infection or co-infection with other pathogens not detected by the test. Clinical correlation with patient history and other diagnostic information is necessary to determine patient infection status. The expected result is Negative.  Fact Sheet for Patients: EntrepreneurPulse.com.au  Fact Sheet for Healthcare Providers: IncredibleEmployment.be  This test is not yet approved or cleared by the [place] FDA and  has been authorized for detection and/or diagnosis of SARS-CoV-2 by FDA under an Emergency Use Authorization (EUA).  This EUA will remain in effect (mean ing this test can be used) for the duration of  the COVID-19 declaration under Section 564(b)(1) of the Act, 21 U.S.C. section 360bbb-3(b)(1), unless the authorization is terminated or revoked sooner.     Influenza A by PCR NEGATIVE NEGATIVE Final   Influenza B by PCR NEGATIVE NEGATIVE Final    Comment: (NOTE) The Xpert Xpress SARS-CoV-2/FLU/RSV plus assay is intended as an aid in the diagnosis of influenza from Nasopharyngeal swab specimens and should not be used as a sole basis for treatment. Nasal washings and aspirates are unacceptable for Xpert Xpress SARS-CoV-2/FLU/RSV testing.  Fact Sheet for Patients: EntrepreneurPulse.com.au  Fact Sheet for Healthcare Providers: IncredibleEmployment.be  This test is not yet approved or cleared by the [place] FDA and has been authorized for detection and/or diagnosis of SARS-CoV-2 by FDA under an Emergency Use Authorization (EUA). This EUA will remain in effect (meaning this test can be used) for the duration of the COVID-19 declaration under Section 564(b)(1) of the Act, 21  U.S.C. section 360bbb-3(b)(1), unless the authorization is terminated or revoked.  Performed at [facility], [place] [address], [place], [place] [address]          Radiology Studies: DG Chest 2 View  Result Date: 07/27/2021 CLINICAL DATA:  Shortness of breath over the last 3 months with productive cough and dyspnea with exertion. Non-Hodgkin's lymphoma. EXAM: CHEST - 2 VIEW COMPARISON:  CT chest 08/04/2021 and chest radiograph 08/17/2021 FINDINGS: Continued substantial consolidation in the right lower lobe, potentially from pneumonia or pulmonary lymphoma. Mild indistinct airspace opacity in the left lower lobe as well, with slight obscuration of left hemidiaphragm. Power injectable right Port-A-Cath tip: SVC. Atherosclerotic calcification of the aortic arch. Prominence of mediastinal and pericardial adipose tissues as on recent CT. Trace fluid in the right lower major fissure. A component of pleural effusion on the right is not excluded. IMPRESSION: 1. Continued consolidation in the  right lower lobe. The appearance could be from pneumonia or pulmonary involvement by lymphoma. 2. Mild left lower lobe airspace opacity, nonspecific, pneumonia not excluded. 3. At least a trace right pleural effusion with fluid along the lower portion of the major fissure. 4.  Aortic Atherosclerosis (ICD10-I70.0). Electronically Signed   By: Van Clines M.D.   On: 07/27/2021 14:33   CT Chest Wo Contrast  Result Date: 08/14/2021 CLINICAL DATA:  Pneumonia.  Cough. EXAM: CT CHEST WITHOUT CONTRAST TECHNIQUE: Multidetector CT imaging of the chest was performed following the standard protocol without IV contrast. COMPARISON:  PET-CT 06/11/2021. FINDINGS: Cardiovascular: Heart and aorta are normal in size. There is no pericardial effusion. There are atherosclerotic calcifications of the aorta and coronary arteries. Right chest port catheter tip ends in the distal SVC. Superior vena cava is small in  size, similar to the prior exam. Mediastinum/Nodes: The visualized thyroid gland is within normal limits. There is an enlarged subcarinal lymph node measuring 13 mm short axis similar to the prior study. Additional nonenlarged mediastinal lymph nodes are unchanged. Difficult to assess for right hilar adenopathy secondary to lack of contrast. Esophagus is unremarkable. Lungs/Pleura: There is a new moderate-sized right pleural effusion. There is some peripheral ill-defined ground-glass and solid nodular densities throughout the right lung measuring up to 8 mm. There is compressive atelectasis and small amount of airspace disease in the right lower lobe containing air bronchograms. There is some ground-glass and interstitial opacities in the left lower lobe is well. No pneumothorax. Trachea and central airways are patent. Upper Abdomen: Gallbladder surgically absent. Limited evaluation of the upper abdomen is otherwise within normal limits. Musculoskeletal: Multilevel degenerative changes affect the spine. No acute fractures are seen. IMPRESSION: 1. New moderate-sized right pleural effusion. 2. Right lower lobe atelectasis and airspace disease worrisome for infection. 3. New diffuse right lung peripheral solid and ground-glass nodular densities, indeterminate. Findings may be infectious/inflammatory or neoplastic. 4. Ground-glass and interstitial opacities in the left lung base favors as infection/inflammation. 5. Stable enlarged subcarinal lymph node. Electronically Signed   By: Ronney Asters M.D.   On: 07/21/2021 18:43   VAS [place] LOWER EXTREMITY VENOUS (DVT) (7a-7p)  Result Date: 08/02/2021  Lower Venous DVT Study Patient Name:  Derek [name]  Date of Exam:   07/30/2021 Medical Rec #: [id number]    Accession #:    [id number] Date of Birth: 11-Jul-1941    Patient Gender: M Patient Age:   61 years Exam Location:  [facility] Procedure:      VAS [place] LOWER EXTREMITY VENOUS (DVT) Referring Phys: Fredia Sorrow  --------------------------------------------------------------------------------  Indications: Edema, and Palpable Cord.  Comparison Study: No prior study Performing Technologist: Maudry Mayhew MHA, RDMS, RVT, RDCS  Examination Guidelines: A complete evaluation includes B-mode imaging, spectral Doppler, color Doppler, and power Doppler as needed of all accessible portions of each vessel. Bilateral testing is considered an integral part of a complete examination. Limited examinations for reoccurring indications may be performed as noted. The reflux portion of the exam is performed with the patient in reverse Trendelenburg.  +-----+---------------+---------+-----------+----------+--------------+ RIGHTCompressibilityPhasicitySpontaneityPropertiesThrombus Aging +-----+---------------+---------+-----------+----------+--------------+ CFV  Full           Yes      Yes                                 +-----+---------------+---------+-----------+----------+--------------+   +---------+---------------+---------+-----------+----------+--------------+ LEFT     CompressibilityPhasicitySpontaneityPropertiesThrombus Aging +---------+---------------+---------+-----------+----------+--------------+ CFV  Full           Yes      Yes                                 +---------+---------------+---------+-----------+----------+--------------+ SFJ      Full                                                        +---------+---------------+---------+-----------+----------+--------------+ FV Prox  Full                                                        +---------+---------------+---------+-----------+----------+--------------+ FV Mid   Full                                                        +---------+---------------+---------+-----------+----------+--------------+ FV DistalFull                                                         +---------+---------------+---------+-----------+----------+--------------+ PFV      Full                                                        +---------+---------------+---------+-----------+----------+--------------+ POP      Full           Yes      Yes                                 +---------+---------------+---------+-----------+----------+--------------+ PTV      Full                                                        +---------+---------------+---------+-----------+----------+--------------+ PERO     Full                                                        +---------+---------------+---------+-----------+----------+--------------+ Gastroc  Full                                                        +---------+---------------+---------+-----------+----------+--------------+  Summary: RIGHT: - No evidence of common femoral vein obstruction.  LEFT: - There is no evidence of deep vein thrombosis in the lower extremity.  - No cystic structure found in the popliteal fossa.  *See table(s) above for measurements and observations.    Preliminary         Scheduled Meds:  amiodarone  200 mg Oral Daily   levothyroxine  25 mcg Oral Q0600   metoprolol succinate  12.5 mg Oral Daily   mometasone-formoterol  2 puff Inhalation BID   rosuvastatin  10 mg Oral Daily   tamsulosin  0.4 mg Oral Daily   umeclidinium bromide  1 puff Inhalation Daily   Continuous Infusions:  sodium chloride     sodium chloride 75 mL/hr at 08/08/2021 1725   azithromycin     cefTRIAXone (ROCEPHIN)  IV       LOS: 1 day    Time spent:35 mins. More than 50% of that time was spent in counseling and/or coordination of care.      Shelly Coss, MD [facility] P12/09/2020, 8:19 AM

## 2021-08-19 NOTE — ED Notes (Signed)
Patient was repositioned in bed

## 2021-08-19 DEATH — deceased

## 2021-08-20 ENCOUNTER — Other Ambulatory Visit (HOSPITAL_COMMUNITY): Payer: Medicare Other

## 2021-08-20 DIAGNOSIS — J9601 Acute respiratory failure with hypoxia: Secondary | ICD-10-CM | POA: Diagnosis not present

## 2021-08-20 LAB — CBC WITH DIFFERENTIAL/PLATELET
Abs Immature Granulocytes: 0.07 10*3/uL (ref 0.00–0.07)
Basophils Absolute: 0 10*3/uL (ref 0.0–0.1)
Basophils Relative: 1 %
Eosinophils Absolute: 0 10*3/uL (ref 0.0–0.5)
Eosinophils Relative: 1 %
HCT: 32.3 % — ABNORMAL LOW (ref 39.0–52.0)
Hemoglobin: 10.3 g/dL — ABNORMAL LOW (ref 13.0–17.0)
Immature Granulocytes: 6 %
Lymphocytes Relative: 31 %
Lymphs Abs: 0.4 10*3/uL — ABNORMAL LOW (ref 0.7–4.0)
MCH: 27.2 pg (ref 26.0–34.0)
MCHC: 31.9 g/dL (ref 30.0–36.0)
MCV: 85.2 fL (ref 80.0–100.0)
Monocytes Absolute: 0.3 10*3/uL (ref 0.1–1.0)
Monocytes Relative: 25 %
Neutro Abs: 0.4 10*3/uL — CL (ref 1.7–7.7)
Neutrophils Relative %: 36 %
Platelets: 124 10*3/uL — ABNORMAL LOW (ref 150–400)
RBC: 3.79 MIL/uL — ABNORMAL LOW (ref 4.22–5.81)
RDW: 16.5 % — ABNORMAL HIGH (ref 11.5–15.5)
WBC: 1.2 10*3/uL — CL (ref 4.0–10.5)
nRBC: 0 % (ref 0.0–0.2)

## 2021-08-20 LAB — BASIC METABOLIC PANEL
Anion gap: 9 (ref 5–15)
BUN: 35 mg/dL — ABNORMAL HIGH (ref 8–23)
CO2: 22 mmol/L (ref 22–32)
Calcium: 7.9 mg/dL — ABNORMAL LOW (ref 8.9–10.3)
Chloride: 104 mmol/L (ref 98–111)
Creatinine, Ser: 1.98 mg/dL — ABNORMAL HIGH (ref 0.61–1.24)
GFR, Estimated: 34 mL/min — ABNORMAL LOW (ref >60)
Glucose, Bld: 109 mg/dL — ABNORMAL HIGH (ref 70–99)
Potassium: 3.5 mmol/L (ref 3.5–5.1)
Sodium: 135 mmol/L (ref 135–145)

## 2021-08-20 LAB — LEGIONELLA PNEUMOPHILA SEROGP 1 UR AG: L. pneumophila Serogp 1 Ur Ag: NEGATIVE

## 2021-08-20 MED ORDER — PROSOURCE PLUS PO LIQD
30.0000 mL | Freq: Two times a day (BID) | ORAL | Status: DC
  Administered 2021-08-20 – 2021-08-25 (×11): 30 mL via ORAL
  Filled 2021-08-20 (×12): qty 30

## 2021-08-20 MED ORDER — ADULT MULTIVITAMIN W/MINERALS CH
1.0000 | ORAL_TABLET | Freq: Every day | ORAL | Status: DC
  Administered 2021-08-20 – 2021-08-26 (×7): 1 via ORAL
  Filled 2021-08-20 (×7): qty 1

## 2021-08-20 MED ORDER — ENSURE ENLIVE PO LIQD
237.0000 mL | Freq: Two times a day (BID) | ORAL | Status: DC
  Administered 2021-08-20 – 2021-08-25 (×7): 237 mL via ORAL

## 2021-08-20 MED ORDER — RIVAROXABAN 20 MG PO TABS
20.0000 mg | ORAL_TABLET | Freq: Every day | ORAL | Status: DC
  Administered 2021-08-20 – 2021-08-22 (×3): 20 mg via ORAL
  Filled 2021-08-20 (×3): qty 1

## 2021-08-20 NOTE — Progress Notes (Signed)
Initial Nutrition Assessment  DOCUMENTATION CODES:   Non-severe (moderate) malnutrition in context of chronic illness, Obesity unspecified  INTERVENTION:  - will order Ensure Enlive BID, each supplement provides 350 kcal and 20 grams of protein. - will order 30 ml Prosource Plus BID, each supplement provides 100 kcal and 15 grams protein.  - will order 1 tablet multivitamin with minerals/day.  - recommend liberalize diet from Heart Healthy to Regular (cleared by MD).  - encourage visitors to bring in foods and/or beverages that patient enjoys and does well with.     NUTRITION DIAGNOSIS:   Moderate Malnutrition related to chronic illness, cancer and cancer related treatments as evidenced by mild fat depletion, mild muscle depletion, moderate muscle depletion, percent weight loss.  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 He presented to the ED due to increased shortness of breath; reported dyspnea x3 months PTA. He is chronically on 20 mg prednisone. He was diagnosed with COVID-19 in 10/2020 and continues to be positive. He has been off of chemo x3 months due to respiratory issues. In the ED he was started on IV Lasix due to severe volume overload.  Patient laying in bed with no visitors present at the time of RD visit. He was short of breath throughout discussion.   Patient shares that shortness of breath has been ongoing and has led to increased weakness, especially over the past 1 month. He is unable to walk to the bathroom or other short distances without difficulty and needing to rest. Unclear if eating causes increase in shortness of breath.   He shares that  for several months appetite has been decreased to poor and that he eats very little due to lack of appetite and loss of taste sensation while he was undergoing chemo; this has not returned despite being off of chemo for several months.   At home he would drink chocolate Ensure due to not eating and is receptive to receiving them during hospitalization.   Patient lives alone. He has a girlfriend. Encouraged patient to have her bring in any foods that he likes.   Weight yesterday was 272 lb and weight on 04/19/21 was 308 lb. This indicates 36 lb weight loss (11.7% body weight) in the past 4 months; significant for time frame.    Labs reviewed; BUN: 35 mg/dl, creatinine: 1.98 mg/dl, Ca: 7.9 mg/dl, GFR: 34 ml/min.   Medications reviewed; 80 mg IV lasix BID, 25 mcg oral synthroid/day.    NUTRITION - FOCUSED PHYSICAL EXAM:  Flowsheet Row Most Recent Value  Orbital Region Mild depletion  Upper Arm Region Severe depletion  Thoracic and Lumbar Region Unable to assess  Buccal Region Mild depletion  Temple Region Mild depletion  Clavicle Bone Region Moderate depletion  Clavicle and Acromion Bone Region Moderate depletion  Scapular Bone Region Unable to assess  Dorsal Hand Moderate depletion  Patellar Region Mild depletion  Anterior Thigh Region Mild depletion  Posterior Calf Region Mild depletion  Edema (RD Assessment) Mild  [BLE]  Hair Reviewed  Eyes Reviewed  Mouth Reviewed  Skin Reviewed  Nails Reviewed       Diet Order:   Diet Order  Diet Heart Room service appropriate? Yes; Fluid consistency: Thin  Diet effective now                   EDUCATION NEEDS:   No education needs have been identified at this time  Skin:  Skin Assessment: Reviewed RN Assessment  Last BM:  PTA/unknown  Height:   Ht Readings from Last 1 Encounters:  08/19/21 6\' 4"  (1.93 m)    Weight:   Wt Readings from Last 1 Encounters:  08/19/21 123.4 kg     Estimated Nutritional  Needs:  Kcal:  2400-2600 kcal Protein:  125-140 grams Fluid:  >/= 2 L/day     Jarome Matin, MS, RD, LDN, CNSC Inpatient Clinical Dietitian RD pager # available in [facility]  After hours/weekend pager # available in [facility]

## 2021-08-20 NOTE — TOC Progression Note (Signed)
Transition of Care [facility]) - Progression Note    Patient Details  Name: Derek [name] MRN: [id number] Date of Birth: 24-Jan-1941  [facility]) CM/SW Contact  Purcell Mouton, RN Phone Number: 08/20/2021, 2:53 PM  Clinical Narrative:    PT from home alone. TOC will continue to follow.    Expected Discharge Plan: [place] Barriers to Discharge: No Barriers Identified  Expected Discharge Plan and Services Expected Discharge Plan: [place] arrangements for the past 2 months: Single Family Home                                       Social Determinants of Health (SDOH) Interventions    Readmission Risk Interventions No flowsheet data found.

## 2021-08-20 NOTE — Progress Notes (Addendum)
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  Dyspnea has been going on for last 3 months.  He follows with pulmonology.  He is on 20 mg of prednisone chronically as per oncology.  His last appointment with pulmonology was on 11/2.  Patient's reported fever of 103 Fahrenheit at home.  He was diagnosed with COVID back in February but continues to be positive.  He is off chemotherapy for the last 3 months due to persistent respiratory issues, history of recent weight loss.  On presentation he was afebrile, normotensive but hypoxic on room air with 85% saturation.  He was placed on 4 L of nasal cannula.  Lab work showed pancytopenia .CT chest showed new moderate right pleural effusion, diffuse right peripheral solid and groundglass nodular densities representing infectious versus neoplastic process.  Started on azithromycin and ceftriaxone for pneumonia.  Also started on Lasix IV for severe volume overload, echocardiogram ordered to rule out CHF.  Assessment & Plan:   Principal Problem:   Acute respiratory failure with hypoxia (HCC) Active Problems:   Diabetes mellitus, type II (HCC)   Diffuse large B cell lymphoma (HCC)   Thrombocytopenia (HCC)   Chronic kidney disease (CKD), 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HCC)   PAF (paroxysmal atrial fibrillation) (HCC)   COPD (chronic obstructive pulmonary disease) ([place])   Community acquired pneumonia   Pleural effusion   Leukopenia   Neutropenia (HCC)   Hypothyroidism   Acute respiratory acidosis (HCC)   Acute hypoxic respiratory failure: Hypoxia on presentation.  Thought to be  secondary to possible right-sided pneumonia/right-sided pleural effusion, and possibly secondary to pulmonary edema.  Also has history of COPD, history of pneumonitis from COVID/chemotherapy.  CT chest also showed diffuse right peripheral solid and groundglass nodular densities representing infectious versus neoplastic process.  He was put on 4 L of oxygen per minute Started on management for community-acquired pneumonia.  Currently on Rocephin, azithromycin. He follows with Dr. [name].  He is not on home oxygen.  We will try to taper the oxygen  Right-sided pleural effusion: Follow-up US chest showed only small right pleural effusion, thoracentesis not performed.  Volume overload: Had severe bilateral lower extremity edema. Elevated  BNP, on Lasix 80 mg IV twice a day.  We will check echocardiogram.  Needs to rule out chemotherapy-induced cardiomyopathy.  Pancytopenia: Secondary to history of large B-cell lymphoma/chemotherapy.  Currently afebrile, ANC of 1.5 on presentation. He follows with Dr. Alvy Bimler.  Oncology following here  History of paroxysmal A. fib/a flutter: On Xarelto which will be continued.  Also on amiodarone and metoprolol.  History of coronary artery disease: On statin, beta-blocker  Diabetes type 2: Monitor blood sugars.  Continue sliding scale  insulin for now.  Hypothyroidism: On levothyroxine  CKD stage IV: Currently creatinine at baseline and improving with diuresis.  Continue to monitor.  Avoid nephrotoxins  Debility/deconditioning/weakness: Has been very weak recently.  We will request for PT/OT evaluation.  Lives with his son at home         DVT prophylaxis:Xarelto Code Status: Full Family Communication: Significant other at bedside Patient status:Inpatient  Dispo: The patient is from: Home  Anticipated d/c is to: Home              Anticipated d/c date is: in 1-2 days  Consultants: Oncology  Procedures:None  Antimicrobials:   Anti-infectives (From admission, onward)    Start     Dose/Rate Route Frequency Ordered Stop   08/19/21 2200  cefTRIAXone (ROCEPHIN) 2 g in sodium chloride 0.9 % 100 mL IVPB        2 g 200 mL/hr over 30 Minutes Intravenous Every 24 hours 08/04/2021 2314 08/24/21 2159   08/19/21 2000  azithromycin (ZITHROMAX) 500 mg in sodium chloride 0.9 % 250 mL IVPB        500 mg 250 mL/hr over 60 Minutes Intravenous Every 24 hours 08/16/2021 2314 08/24/21 1959   07/28/2021 2030  cefTRIAXone (ROCEPHIN) 1 g in sodium chloride 0.9 % 100 mL IVPB        1 g 200 mL/hr over 30 Minutes Intravenous  Once 08/12/2021 2018 07/20/2021 2117   07/30/2021 2030  azithromycin (ZITHROMAX) 500 mg in sodium chloride 0.9 % 250 mL IVPB        500 mg 250 mL/hr over 60 Minutes Intravenous  Once 08/09/2021 2018 08/13/2021 2321       Subjective:  Patient seen and examined at the bedside this morning.  Hemodynamically stable.  He was sitting at the edge of the bed.  He feels better than yesterday.  Bilateral lower extremity edema has significantly improved, he looks better  Objective: Vitals:   08/19/21 2337 08/20/21 0000 08/20/21 0329 08/20/21 0400  BP: 137/78 135/74 131/71 126/76  Pulse: 73 72 71 74  Resp:  (!) 24 (!) 21 14  Temp: 98.5 F (36.9 C)  98.2 F (36.8 C)   TempSrc: Oral  Oral   SpO2: 95% 93% 94% 94%  Weight:      Height:        Intake/Output Summary (Last 24 hours) at 08/20/2021 3716 Last data filed at 08/20/2021 0500 Gross per 24 hour  Intake 350 ml  Output 1400 ml  Net -1050 ml   Filed Weights   08/19/21 1526  Weight: 123.4 kg    Examination:  General exam: Chronically ill looking, weak, deconditioned, morbidly obese HEENT: PERRL Respiratory system: Mild diminished air entry on both sides no crackles or wheezes Cardiovascular system: S1 & S2 heard, RRR.  [place] Gastrointestinal system: Abdomen is nondistended, soft and nontender. Central nervous system: Alert and oriented Extremities: No edema, no  clubbing ,no cyanosis Skin: No rashes, no ulcers,no icterus    Data Reviewed: I have personally reviewed following labs and imaging studies  CBC: Recent Labs  Lab 08/06/2021 1551 08/19/21 0434 08/20/21 0403  WBC 2.3* 1.3* 1.2*  NEUTROABS 1.5*  --  0.4*  HGB 11.3* 10.0* 10.3*  HCT 35.8* 31.4* 32.3*  MCV 87.3 85.6 85.2  PLT 135* 119* 967*   Basic Metabolic Panel: Recent Labs  Lab 08/12/2021 1551 08/20/21 0403  NA 134* 135  K 4.4 3.5  CL 104 104  CO2 19* 22  GLUCOSE 190* 109*  BUN 37* 35*  CREATININE 2.13* 1.98*  CALCIUM 8.4* 7.9*   GFR: Estimated Creatinine Clearance: 43.4 mL/min (A) (by C-G formula based on SCr of 1.98 mg/dL (H)). Liver Function Tests: Recent Labs  Lab 08/05/2021 1551  AST 33  ALT 15  ALKPHOS 62  BILITOT 0.8  PROT 6.3*  ALBUMIN 2.7*   No results for input(s): LIPASE, AMYLASE in the last 168 hours. No results for input(s): AMMONIA in  the last 168 hours. Coagulation Profile: No results for input(s): INR, PROTIME in the last 168 hours. Cardiac Enzymes: No results for input(s): CKTOTAL, CKMB, CKMBINDEX, TROPONINI in the last 168 hours. BNP (last 3 results) No results for input(s): PROBNP in the last 8760 hours. HbA1C: No results for input(s): HGBA1C in the last 72 hours. CBG: No results for input(s): GLUCAP in the last 168 hours. Lipid Profile: No results for input(s): CHOL, HDL, LDLCALC, TRIG, CHOLHDL, LDLDIRECT in the last 72 hours. Thyroid Function Tests: No results for input(s): TSH, T4TOTAL, FREET4, T3FREE, THYROIDAB in the last 72 hours. Anemia Panel: No results for input(s): VITAMINB12, FOLATE, FERRITIN, TIBC, IRON, RETICCTPCT in the last 72 hours. Sepsis Labs: Recent Labs  Lab 08/17/2021 2206  LATICACIDVEN 1.3    Recent Results (from the past 240 hour(s))  Resp Panel by RT-PCR (Flu A&B, Covid) Nasopharyngeal Swab     Status: Abnormal   Collection Time: 08/17/2021  3:52 PM   Specimen: Nasopharyngeal Swab; Nasopharyngeal(NP) swabs in  vial transport medium  Result Value Ref Range Status   SARS Coronavirus 2 by RT PCR POSITIVE (A) NEGATIVE Final    Comment: CRITICAL RESULT CALLED TO, READ BACK BY AND VERIFIED WITH: RN N WHEATLEY AT 1957 08/17/2021 CRU[place]ANK A (NOTE) SARS-CoV-2 target nucleic acids are DETECTED.  The SARS-CoV-2 RNA is generally detectable in upper respiratory specimens during the acute phase of infection. Positive results are indicative of the presence of the identified virus, but do not rule out bacterial infection or co-infection with other pathogens not detected by the test. Clinical correlation with patient history and other diagnostic information is necessary to determine patient infection status. The expected result is Negative.  Fact Sheet for Patients: EntrepreneurPulse.com.au  Fact Sheet for Healthcare Providers: IncredibleEmployment.be  This test is not yet approved or cleared by the [place] FDA and  has been authorized for detection and/or diagnosis of SARS-CoV-2 by FDA under an Emergency Use Authorization (EUA).  This EUA will remain in effect (mean ing this test can be used) for the duration of  the COVID-19 declaration under Section 564(b)(1) of the Act, 21 U.S.C. section 360bbb-3(b)(1), unless the authorization is terminated or revoked sooner.     Influenza A by PCR NEGATIVE NEGATIVE Final   Influenza B by PCR NEGATIVE NEGATIVE Final    Comment: (NOTE) The Xpert Xpress SARS-CoV-2/FLU/RSV plus assay is intended as an aid in the diagnosis of influenza from Nasopharyngeal swab specimens and should not be used as a sole basis for treatment. Nasal washings and aspirates are unacceptable for Xpert Xpress SARS-CoV-2/FLU/RSV testing.  Fact Sheet for Patients: EntrepreneurPulse.com.au  Fact Sheet for Healthcare Providers: IncredibleEmployment.be  This test is not yet approved or cleared by the [place] FDA and has been authorized for detection and/or diagnosis of SARS-CoV-2 by FDA under an Emergency Use Authorization (EUA). This EUA will remain in effect (meaning this test can be used) for the duration of the COVID-19 declaration under Section 564(b)(1) of the Act, 21 U.S.C. section 360bbb-3(b)(1), unless the authorization is terminated or revoked.  Performed at [facility], [place] [address] [place], [place] [address]   Culture, blood (Routine X 2) w Reflex to ID Panel     Status: None (Preliminary result)   Collection Time: 07/21/2021 10:02 PM   Specimen: BLOOD  Result Value Ref Range Status   Specimen Description   Final    BLOOD PORTA CATH Performed at [facility] [address] [place], [place]  [address]    Special Requests   Final    BOTTLES DRAWN AEROBIC AND ANAEROBIC Blood Culture adequate volume Performed at [facility] [address] [place], [place] [address]    Culture   Final    NO GROWTH 1 DAY Performed at [facility], [place] [address], [place], [place] [address]    Report Status PENDING  Incomplete  Culture, blood (Routine X 2) w Reflex to ID Panel     Status: None (Preliminary result)   Collection Time: 08/19/21  4:24 AM   Specimen: BLOOD  Result Value Ref Range Status   Specimen Description   Final    BLOOD PORTA CATH Performed at [facility] [address] [place], [place] [address]    Special Requests   Final    BOTTLES DRAWN AEROBIC AND ANAEROBIC Blood Culture results may not be optimal due to an excessive volume of blood received in culture bottles Performed at [facility] [address], [place], [place] [address]    Culture   Final    NO GROWTH 1 DAY Performed at [facility], [place] [address] [place], [place] [address]    Report Status PENDING  Incomplete         Radiology Studies: DG Chest 2 View  Result Date:  07/20/2021 CLINICAL DATA:  Shortness of breath over the last 3 months with productive cough and dyspnea with exertion. Non-Hodgkin's lymphoma. EXAM: CHEST - 2 VIEW COMPARISON:  CT chest 08/04/2021 and chest radiograph 08/17/2021 FINDINGS: Continued substantial consolidation in the right lower lobe, potentially from pneumonia or pulmonary lymphoma. Mild indistinct airspace opacity in the left lower lobe as well, with slight obscuration of left hemidiaphragm. Power injectable right Port-A-Cath tip: SVC. Atherosclerotic calcification of the aortic arch. Prominence of mediastinal and pericardial adipose tissues as on recent CT. Trace fluid in the right lower major fissure. A component of pleural effusion on the right is not excluded. IMPRESSION: 1. Continued consolidation in the right lower lobe. The appearance could be from pneumonia or pulmonary involvement by lymphoma. 2. Mild left lower lobe airspace opacity, nonspecific, pneumonia not excluded. 3. At least a trace right pleural effusion with fluid along the lower portion of the major fissure. 4.  Aortic Atherosclerosis (ICD10-I70.0). Electronically Signed   By: Van Clines M.D.   On: 07/21/2021 14:33   CT Chest Wo Contrast  Result Date: 08/07/2021 CLINICAL DATA:  Pneumonia.  Cough. EXAM: CT CHEST WITHOUT CONTRAST TECHNIQUE: Multidetector CT imaging of the chest was performed following the standard protocol without IV contrast. COMPARISON:  PET-CT 06/11/2021. FINDINGS: Cardiovascular: Heart and aorta are normal in size. There is no pericardial effusion. There are atherosclerotic calcifications of the aorta and coronary arteries. Right chest port catheter tip ends in the distal SVC. Superior vena cava is small in size, similar to the prior exam. Mediastinum/Nodes: The visualized thyroid gland is within normal limits. There is an enlarged subcarinal lymph node measuring 13 mm short axis similar to the prior study. Additional nonenlarged mediastinal lymph  nodes are unchanged. Difficult to assess for right hilar adenopathy secondary to lack of contrast. Esophagus is unremarkable. Lungs/Pleura: There is a new moderate-sized right pleural effusion. There is some peripheral ill-defined ground-glass and solid nodular densities throughout the right lung measuring up to 8 mm. There is compressive atelectasis and small amount of airspace disease in the right lower lobe containing air bronchograms. There is some ground-glass and interstitial opacities in the  left lower lobe is well. No pneumothorax. Trachea and central airways are patent. Upper Abdomen: Gallbladder surgically absent. Limited evaluation of the upper abdomen is otherwise within normal limits. Musculoskeletal: Multilevel degenerative changes affect the spine. No acute fractures are seen. IMPRESSION: 1. New moderate-sized right pleural effusion. 2. Right lower lobe atelectasis and airspace disease worrisome for infection. 3. New diffuse right lung peripheral solid and ground-glass nodular densities, indeterminate. Findings may be infectious/inflammatory or neoplastic. 4. Ground-glass and interstitial opacities in the left lung base favors as infection/inflammation. 5. Stable enlarged subcarinal lymph node. Electronically Signed   By: Ronney Asters M.D.   On: 08/13/2021 18:43   [place] CHEST (PLEURAL EFFUSION)  Result Date: 08/19/2021 CLINICAL DATA:  Evaluate for thoracentesis. EXAM: CHEST ULTRASOUND COMPARISON:  Chest CT 08/02/2021 FINDINGS: Small amount of right pleural fluid but there is not a good percutaneous window. IMPRESSION: Small right pleural effusion. Thoracentesis not performed due to small volume and lack of safe percutaneous window. Electronically Signed   By: Markus Daft M.D.   On: 08/19/2021 13:40   VAS [place] LOWER EXTREMITY VENOUS (DVT) (7a-7p)  Result Date: 08/19/2021  Lower Venous DVT Study Patient Name:  ZYMIR NAPOLI  Date of Exam:   07/29/2021 Medical Rec #: [id number]    Accession #:     [id number] Date of Birth: [birth year]    Patient Gender: M Patient Age:   21 years Exam Location[facility]  [facility] Procedure:      VAS [place] LOWER EXTREMITY VENOUS (DVT) Referring Phys: Fredia Sorrow --------------------------------------------------------------------------------  Indications: Edema, and Palpable Cord.  Comparison Study: No prior study Performing Technologist: Maudry Mayhew MHA, RDMS, RVT, RDCS  Examination Guidelines: A complete evaluation includes B-mode imaging, spectral Doppler, color Doppler, and power Doppler as needed of all accessible portions of each vessel. Bilateral testing is considered an integral part of a complete examination. Limited examinations for reoccurring indications may be performed as noted. The reflux portion of the exam is performed with the patient in reverse Trendelenburg.  +-----+---------------+---------+-----------+----------+--------------+ RIGHTCompressibilityPhasicitySpontaneityPropertiesThrombus Aging +-----+---------------+---------+-----------+----------+--------------+ CFV  Full           Yes      Yes                                 +-----+---------------+---------+-----------+----------+--------------+   +---------+---------------+---------+-----------+----------+--------------+ LEFT     CompressibilityPhasicitySpontaneityPropertiesThrombus Aging +---------+---------------+---------+-----------+----------+--------------+ CFV      Full           Yes      Yes                                 +---------+---------------+---------+-----------+----------+--------------+ SFJ      Full                                                        +---------+---------------+---------+-----------+----------+--------------+ FV Prox  Full                                                        +---------+---------------+---------+-----------+----------+--------------+ FV Mid   Full                                                         +---------+---------------+---------+-----------+----------+--------------+  FV DistalFull                                                        +---------+---------------+---------+-----------+----------+--------------+ PFV      Full                                                        +---------+---------------+---------+-----------+----------+--------------+ POP      Full           Yes      Yes                                 +---------+---------------+---------+-----------+----------+--------------+ PTV      Full                                                        +---------+---------------+---------+-----------+----------+--------------+ PERO     Full                                                        +---------+---------------+---------+-----------+----------+--------------+ Gastroc  Full                                                        +---------+---------------+---------+-----------+----------+--------------+     Summary: RIGHT: - No evidence of common femoral vein obstruction.  LEFT: - There is no evidence of deep vein thrombosis in the lower extremity.  - No cystic structure found in the popliteal fossa.  *See table(s) above for measurements and observations. Electronically signed by Monica Martinez MD on 08/19/2021 at 5:23:22 PM.    Final         Scheduled Meds:  amiodarone  200 mg Oral Daily   Chlorhexidine Gluconate Cloth  6 each Topical Daily   furosemide  80 mg Intravenous Q12H   levothyroxine  25 mcg Oral Q0600   metoprolol succinate  12.5 mg Oral Daily   mometasone-formoterol  2 puff Inhalation BID   rosuvastatin  10 mg Oral Daily   tamsulosin  0.4 mg Oral Daily   umeclidinium bromide  1 puff Inhalation Daily   Continuous Infusions:  azithromycin 500 mg (08/19/21 2059)   cefTRIAXone (ROCEPHIN)  IV Stopped (08/19/21 2250)     LOS: 2 days    Time spent:35 mins. More than 50% of that time was spent in  counseling and/or coordination of care.      Shelly Coss, MD [facility] P12/10/2020, 8:33 AM

## 2021-08-20 NOTE — Progress Notes (Addendum)
HEMATOLOGY-ONCOLOGY PROGRESS NOTE  I seen the patient and agree with documentation as follows ASSESSMENT & PLAN:  Diffuse large B cell lymphoma ([place]) Overall, his performance status has declined and he has continued to have COPD exacerbation and dyspnea with minimal exertion Was seen by pulmonology who tried him on steroids with very minimal improvement Has been off steroids for about 2 weeks Due to ongoing infection and respiratory failure, he is not a candidate to receive treatment now Focus on supportive care  Acute hypoxic respiratory failure secondary to possible pneumonia/right pleural effusion Has been started on IV antibiotics per hospitalist Was seen by IR on 12/1 for consideration of ultrasound-guided thoracentesis, but not enough fluid to remove He tested positive for COVID again Immunoglobulin levels are pending If he is hypogammaglobulinemia, I will order IVIG treatment The risk, benefits, side effects of IVIG were discussed with the patient and he is in agreement to proceed   Pancytopenia Unclear if due to lymphoma versus bone marrow suppression from acute infection He is afebrile at this time and will hold off on Granix No transfusion currently indicated I will order CBC with differential daily If he becomes febrile or with signs of sepsis, he will benefit from G-CSF support   chronic kidney disease (CKD), 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HCC) Creatinine overall consistent with recent baseline  We discussed the importance of risk factor modification and adequate hydration Avoid nephrotoxic medications  Paroxysmal atrial fibrillation/atrial flutter Xarelto currently on hold pending thoracentesis-okay to resume from our standpoint following procedure On amiodarone and metoprolol per hospitalist  Discharge planning Not currently stable for discharge due to ongoing dyspnea Will need thoracentesis and treatment for possible pneumonia May need input from pulmonology this  admission He would likely be here for the next 4 to 5 days Will follow on Monday, if questions arise on the weekend, pls call consult service on-call  [name], MD SUBJECTIVE: He thinks his shortness of breath may be slightly improved However, has not yet walked this morning He denies fevers and chills He is not having any chest pain, abdominal pain, nausea, vomiting  Oncology History Overview Note  Normal FISH for CLL DLBCL transformed from CLL; c-myc by [place] and other rearrangements were not detected   Diffuse large B cell lymphoma ([name])  09/10/2013 Initial Diagnosis   CLL (chronic lymphocytic leukemia)   12/26/2013 Pathology Results   FISH analysis was normal.   06/17/2014 Procedure   The patient has placement of Infuse-a-Port.   06/18/2014 Imaging   CT scan of the chest, abdomen and pelvis showed diffuse lymphadenopathy and splenomegaly.   06/19/2014 Bone Marrow Biopsy   Bone marrow aspirate and biopsy show CLL.   06/24/2014 - 11/18/2014 Chemotherapy   He received 6 cycles of Obinutuzumab and chlorambucil   09/15/2014 Imaging   Repeat CT scan of the chest, abdomen and pelvis show greater than 50% reduction in lymphadenopathy and splenomegaly   12/19/2014 Imaging   Repeat CT scan showed complete resolution of lymphadenopathy and splenomegaly.   04/22/2020 Imaging   CT neck 1. Cervical adenopathy involving the bilateral level 1B, right 2/3 and bilateral level 5 nodal stations. 2. Prominent to mildly enlarged upper mediastinal nodes. 3. Prominent subcentimeter right intraparotid node.     06/03/2020 -  Chemotherapy   The patient had rituximab for chemotherapy treatment.     08/31/2020 Imaging   CT neck 1. Regression of Leukemia. Resolved widespread cervical lymphadenopathy since August. Largest residual left level IIIb node now 8-9 mm short axis (previously  16 mm). 2.  CT Chest, Abdomen, and Pelvis today are reported separately.     08/31/2020 Imaging   Limited  evaluation due to lack of intravenous contrast administration.   7.1 cm left perirenal soft tissue mass along the posterior left upper kidney, suspicious for perirenal lymphoma, although poorly evaluated. This is essentially new from May 2021.   Possible periureteral soft tissue along the right proximal collecting system/ureter, equivocal.   New 12 mm right lower lobe pulmonary nodule, suspicious for metastasis/pulmonary lymphoma. Additional 6 mm (mean diameter) subpleural nodule in the lingula is new from 2016, indeterminate.   No suspicious lymphadenopathy in the chest, abdomen, or pelvis. Spleen is normal in size.     02/05/2021 Imaging   1. Interval enlargement of multiple subcutaneous soft tissue nodules or lymph nodes, in the left axilla and overlying the sacrum and left buttock. 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 2. Significant interval increase in size of right lower lobe and lingular pulmonary nodules, now with large subpleural opacities, consistent with worsened pulmonary lymphomatous involvement. 3. Interval decrease in size of a left perinephric soft tissue mass.  4. Constellation of findings above suggest mixed response to treatment. 5. 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 No clear evidence of honeycombing. 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 [place], Iss 5, [phone], May 20 2017. 6. Coronary artery disease.   02/05/2021 Pathology Results   A. LUNG, RIGHT LOWER LOBE MASS, NEEDLE CORE BIOPSY:  -Atypical lymphoid infiltrate consistent with non-Hodgkin B-cell lymphoma  -See comment    COMMENT:   The sections show small needle core biopsy fragments displaying a dense infiltrate of primarily large lymphoid cells characterized by partially clumped to vesicular chromatin and variably prominent nucleoli associated with brisk mitosis.  The appearance is diffuse with lack of atypical follicles.  Flow cytometric analysis was attempted ([id number]) but there were insufficient cells present in the sample for analysis. Hence a battery of immunohistochemical stains was performed and shows that the atypical lymphoid cells are positive for CD20, CD79a, PAX5, BCL-2, and MUM 1.  Only scattered cells are positive for cytoplasmic kappa and not lambda.  There is patchy weak staining for BCL6.  No significant staining is seen with CD10, CD30, CD34, CD138, cyclin D1, or EBV in situ hybridization.  Ki-67 shows variable increased expression ranging from 30% to over 50% in some areas.  There is an admixed T-cell  population in the background to a lesser extent as seen with CD3 and CD5 and there is no apparent co-expression of CD5 in B-cell areas.  The findings are most consistent with involvement by large B-cell lymphoma,  ABC type.  Given the previous history of chronic lymphocytic leukemia/small lymphocytic lymphoma, this likely represents high-grade transformation.  Clinical correlation is recommended   02/26/2021 PET scan   1. Evidence of multi organ lymphoma recurrence. 2. Enlarging foci of hypermetabolic peripheral consolidation in both lungs consistent pulmonary lymphoma. 3. Multiple foci of hypermetabolic subcutaneous nodularity consistent with subcutaneous lymphoma. 4. Solitary hypermetabolic nodal metastasis in the central mesentery and peritoneal implant along the RIGHT iliacus muscle. 5. THree foci of metabolic activity in the skeleton consistent with skeletal lymphoma.     03/08/2021 Procedure  Status post CT-guided biopsy of right lower lobe lung mass   03/12/2021 Cancer Staging    Staging form: Hodgkin and Non-Hodgkin Lymphoma, AJCC 7th Edition - Clinical stage from 03/12/2021: Stage IV - Signed by [name], MD on 03/12/2021 Staged by: Managing physician Stage prefix: Recurrence Biopsy of metastatic site performed: Yes Source of metastatic specimen: Lung    03/17/2021 Echocardiogram    1. Left ventricular ejection fraction, by estimation, is 55 to 60%. The left ventricle has normal function. The left ventricle has no regional wall motion abnormalities. There is moderate concentric left ventricular hypertrophy. Left ventricular diastolic parameters are indeterminate. The average left ventricular global longitudinal strain is -20.4 %. The global longitudinal strain is normal.  2. Right ventricular systolic function is normal. The right ventricular size is normal. Tricuspid regurgitation signal is inadequate for assessing PA pressure.  3. The mitral valve is grossly normal. Mild mitral valve regurgitation. No evidence of mitral stenosis.  4. The aortic valve is tricuspid. There is mild calcification of the aortic valve. Aortic valve regurgitation is not visualized. Mild aortic valve sclerosis is present, with no evidence of aortic valve stenosis.  5. The inferior vena cava is normal in size with greater than 50% respiratory variability, suggesting right atrial pressure of 3 mmHg.   03/19/2021 Procedure   Successful placement of a right internal jugular approach power injectable Port-A-Cath. The catheter is ready for immediate use.     03/29/2021 -  Chemotherapy   Patient is on Treatment Plan : NON-HODGKINS LYMPHOMA R-CHOP q21d     06/10/2021 Echocardiogram    1. Left ventricular ejection fraction, by estimation, is 55 to 60%. The left ventricle has normal function. The left ventricle has no regional wall motion abnormalities. There is mild left ventricular hypertrophy. Left ventricular diastolic parameters are indeterminate.  2. Right ventricular systolic function is normal.  The right ventricular size is normal.  3. The mitral valve is normal in structure. Trivial mitral valve regurgitation.  4. The aortic valve is normal in structure. Aortic valve regurgitation is not visualized. No aortic stenosis is present.   06/14/2021 PET scan   1. Significant interval positive response to therapy. Residual hypermetabolic peripheral right lower lobe lung mass, decreased in size and metabolism. Additional previously visualized hypermetabolic sites of involvement in the skeleton, subcutaneous and muscular soft tissues and mesenteric lymph nodes have resolved. 2. New patchy ground-glass opacity with associated patchy hypermetabolism throughout both lungs, nonspecific, differential includes drug reaction or atypical infection. Consider attention on follow-up chest CT. 3. Chronic findings include: Aortic Atherosclerosis (ICD10-I70.0). Paranasal sinusitis. Right nephrolithiasis. Marked diffuse colonic diverticulosis. Coronary atherosclerosis. 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 Recommend annual imaging followup by CTA or MRA.      REVIEW OF SYSTEMS:   Eyes: Denies blurriness of vision Ears, nose, mouth, throat, and face: Denies mucositis or sore throat Respiratory: Reports cough and shortness of breath with minimal exertion Gastrointestinal:  Denies nausea, heartburn or change in bowel habits Skin: Denies abnormal skin rashes Lymphatics: Denies new lymphadenopathy or easy bruising Neurological:Denies numbness, tingling or new weaknesses Behavioral/Psych: Mood is stable, no new changes  Extremities: No lower extremity edema All other systems were reviewed with the patient and are negative.  I have reviewed the past medical history, past surgical history, social history and family history with the patient and they are unchanged from previous note.   PHYSICAL EXAMINATION: ECOG PERFORMANCE STATUS: 2 - Symptomatic, <50% confined to bed  Vitals:   08/20/21 0329 08/20/21 0400  BP: 131/71 126/76  Pulse: 71 74  Resp: (!) 21 14  Temp: 98.2 F (36.8 C)   SpO2: 94% 94%   Filed Weights   08/19/21 1526  Weight: 123.4 kg    Intake/Output from previous day: 12/01 0701 - 12/02 0700 In: 350 [IV Piggyback:350] Out: 1400 [Urine:1400]  GENERAL:alert, no distress and comfortable SKIN: skin color, texture, turgor are normal, no rashes or significant lesions LYMPH:  no palpable lymphadenopathy in the cervical, axillary or inguinal LUNGS: Has scattered expiratory wheezes, diminished right lung HEART: regular rate & rhythm and no murmurs and no lower extremity edema ABDOMEN:abdomen soft, non-tender and normal bowel sounds NEURO: alert & oriented x 3 with fluent speech, no focal motor/sensory deficits  LABORATORY DATA:  I have reviewed the data as listed CMP Latest Ref Rng & Units 08/20/2021 07/25/2021 06/29/2021  Glucose 70 - 99 mg/dL 109(H) 190(H) 124(H)  BUN 8 - 23 mg/dL 35(H) 37(H) 30(H)  Creatinine 0.61 - 1.24 mg/dL 1.98(H) 2.13(H) 2.29(H)  Sodium 135 - 145 mmol/L 135 134(L) 135  Potassium 3.5 - 5.1 mmol/L 3.5 4.4 4.3  Chloride 98 - 111 mmol/L 104 104 105  CO2 22 - 32 mmol/L 22 19(L) 19(L)  Calcium 8.9 - 10.3 mg/dL 7.9(L) 8.4(L) 9.2  Total Protein 6.5 - 8.1 g/dL - 6.3(L) 6.1(L)  Total Bilirubin 0.3 - 1.2 mg/dL - 0.8 0.6  Alkaline Phos 38 - 126 U/L - 62 78  AST 15 - 41 U/L - 33 12(L)  ALT 0 - 44 U/L - 15 10    Lab Results  Component Value Date   WBC 1.2 (LL) 08/20/2021   HGB 10.3 (L) 08/20/2021   HCT 32.3 (L) 08/20/2021   MCV 85.2 08/20/2021   PLT 124 (L) 08/20/2021   NEUTROABS 0.4 (LL) 08/20/2021   I have reviewed CT imaging and x-ray myself DG Chest 2 View  Result Date: 08/08/2021 CLINICAL DATA:  Shortness of breath over the last 3 months with productive cough and dyspnea with exertion. Non-Hodgkin's lymphoma. EXAM: CHEST - 2 VIEW COMPARISON:  CT chest 08/04/2021 and chest radiograph 08/17/2021 FINDINGS: Continued substantial consolidation in the  right lower lobe, potentially from pneumonia or pulmonary lymphoma. Mild indistinct airspace opacity in the left lower lobe as well, with slight obscuration of left hemidiaphragm. Power injectable right Port-A-Cath tip: SVC. Atherosclerotic calcification of the aortic arch. Prominence of mediastinal and pericardial adipose tissues as on recent CT. Trace fluid in the right lower major fissure. A component of pleural effusion on the right is not excluded. IMPRESSION: 1. Continued consolidation in the right lower lobe. The appearance could be from pneumonia or pulmonary involvement by lymphoma. 2. Mild left lower lobe airspace opacity, nonspecific, pneumonia not excluded. 3. At least a trace right pleural effusion with fluid along the lower portion of the major fissure. 4.  Aortic Atherosclerosis (ICD10-I70.0). Electronically Signed   By: Van Clines M.D.   On: 08/02/2021 14:33   CT Chest Wo Contrast  Result Date: 07/30/2021 CLINICAL DATA:  Pneumonia.  Cough. EXAM: CT CHEST WITHOUT CONTRAST TECHNIQUE: Multidetector CT imaging of the chest was performed following the standard protocol without IV contrast. COMPARISON:  PET-CT 06/11/2021. FINDINGS: Cardiovascular: Heart and aorta are normal in size. There is no pericardial effusion. There are atherosclerotic calcifications of the aorta and coronary arteries. Right chest port catheter tip ends in the distal SVC. Superior vena cava is small in size, similar to the prior exam. Mediastinum/Nodes: The visualized thyroid gland is within normal limits. There  is an enlarged subcarinal lymph node measuring 13 mm short axis similar to the prior study. Additional nonenlarged mediastinal lymph nodes are unchanged. Difficult to assess for right hilar adenopathy secondary to lack of contrast. Esophagus is unremarkable. Lungs/Pleura: There is a new moderate-sized right pleural effusion. There is some peripheral ill-defined ground-glass and solid nodular densities throughout  the right lung measuring up to 8 mm. There is compressive atelectasis and small amount of airspace disease in the right lower lobe containing air bronchograms. There is some ground-glass and interstitial opacities in the left lower lobe is well. No pneumothorax. Trachea and central airways are patent. Upper Abdomen: Gallbladder surgically absent. Limited evaluation of the upper abdomen is otherwise within normal limits. Musculoskeletal: Multilevel degenerative changes affect the spine. No acute fractures are seen. IMPRESSION: 1. New moderate-sized right pleural effusion. 2. Right lower lobe atelectasis and airspace disease worrisome for infection. 3. New diffuse right lung peripheral solid and ground-glass nodular densities, indeterminate. Findings may be infectious/inflammatory or neoplastic. 4. Ground-glass and interstitial opacities in the left lung base favors as infection/inflammation. 5. Stable enlarged subcarinal lymph node. Electronically Signed   By: Ronney Asters M.D.   On: 07/20/2021 18:43   CT CHEST WO CONTRAST  Result Date: 08/05/2021 CLINICAL DATA:  Shortness of breath for 2 months. History of CLL with chemo. Former smoker. EXAM: CT CHEST WITHOUT CONTRAST TECHNIQUE: Multidetector CT imaging of the chest was performed following the standard protocol without IV contrast. COMPARISON:  CT studies without contrast 02/05/2021 and 08/31/2020 FINDINGS: Cardiovascular: Borderline prominent heart. No pericardial effusion. Heavy three-vessel calcific CAD. Scattered calcifications in the aorta with ectatic ascending segment measuring 4.2 cm, unchanged. Upper limit of normal pulmonary trunk caliber. Mediastinum/Nodes: On the last CT there was a 1.5 cm soft tissue nodule anteriorly in the left axilla which is not seen today. There is a prominent posterior right hilar lymph node of 1.5 cm in short axis, previously 6 mm short axis. No other intrathoracic adenopathy is seen. Thyroid, trachea and esophagus are  unremarkable without contrast. Lungs/Pleura: Mild features of subpleural fibrosis are again noted predominating in the lower zones, but present from the apices inferiorly. This has shown no progression. There is early subpleural microcystic honeycombing in both lateral bases. There is increased subpleural micronodular interstitial change in the anterolateral aspect left upper lobe, level of the aortic arch concerning for pneumonitis/bronchiolitis. There is interval enlargement of a large pleural-based opacity with air bronchograms in the posterior basilar right lower lobe which is now 10.6 x 7.4 cm, previously 7.1 x 4.4 cm. The previously noted pleural-based mass in the lateral base of the lingula has improved however , now measuring 2.1 x 1.5 cm, previously 3.3 x 1.8 cm. There is new hazy interstitial change in the anterior basal left lower lobe concerning for pneumonitis as well as peribronchovascular ground-glass and micronodular disease in the lateral aspect left lower lobe on series 8 axial images 87-90. Rest of the lungs are generally clear. There is no pleural effusion or pneumothorax. Musculoskeletal: There is osteopenia and degenerative changes of the spine. No suspicious chest wall lesions or destructive bone lesions. Right chest port with IJ approach catheter again noted with the tip in the SVC. Upper abdomen: 8 mm calculus again noted in the dependent right renal pelvis, with atrophic kidneys and moderate perinephric stranding as well as small nonobstructive caliceal stones in the right kidney. Soft tissue thickening posterior to left kidney now measures 3.7 x 1.4 cm with increased fatty lucency, previously 4.7  x 2.0 cm. IMPRESSION: 1. Enlarging area of consolidation with air bronchograms in the posterior basal right lower lobe but decreased size of the pre-existing lingular nodule and of a posterior left perinephric soft tissue mass. 2. Increased prominence of a single posterior right hilar lymph node  of 1.6 cm short axis previously 0.6 cm without further adenopathy visible without contrast. 3. Areas of increased opacity in the left upper and lower lobes concerning for interval pneumonitis. Follow-up as indicated. 4. Basal-predominant subpleural fibrosis. 5. Coronary artery and aortic atherosclerosis. Borderline cardiomegaly. Electronically Signed   By: Telford Nab M.D.   On: 08/05/2021 02:15   [place] CHEST (PLEURAL EFFUSION)  Result Date: 08/19/2021 CLINICAL DATA:  Evaluate for thoracentesis. EXAM: CHEST ULTRASOUND COMPARISON:  Chest CT 07/25/2021 FINDINGS: Small amount of right pleural fluid but there is not a good percutaneous window. IMPRESSION: Small right pleural effusion. Thoracentesis not performed due to small volume and lack of safe percutaneous window. Electronically Signed   By: Markus Daft M.D.   On: 08/19/2021 13:40   VAS [place] LOWER EXTREMITY VENOUS (DVT) (7a-7p)  Result Date: 08/19/2021  Lower Venous DVT Study Patient Name:  Derek [name]  Date of Exam:   07/26/2021 Medical Rec #: [id number]    Accession #:    [id number] Date of Birth: 10-23-1940    Patient Gender: M Patient Age:   80 years Exam Location:  [facility] Procedure:      VAS [place] LOWER EXTREMITY VENOUS (DVT) Referring Phys: Fredia Sorrow --------------------------------------------------------------------------------  Indications: Edema, and Palpable Cord.  Comparison Study: No prior study Performing Technologist: Maudry Mayhew MHA, RDMS, RVT, RDCS  Examination Guidelines: A complete evaluation includes B-mode imaging, spectral Doppler, color Doppler, and power Doppler as needed of all accessible portions of each vessel. Bilateral testing is considered an integral part of a complete examination. Limited examinations for reoccurring indications may be performed as noted. The reflux portion of the exam is performed with the patient in reverse Trendelenburg.   +-----+---------------+---------+-----------+----------+--------------+ RIGHTCompressibilityPhasicitySpontaneityPropertiesThrombus Aging +-----+---------------+---------+-----------+----------+--------------+ CFV  Full           Yes      Yes                                 +-----+---------------+---------+-----------+----------+--------------+   +---------+---------------+---------+-----------+----------+--------------+ LEFT     CompressibilityPhasicitySpontaneityPropertiesThrombus Aging +---------+---------------+---------+-----------+----------+--------------+ CFV      Full           Yes      Yes                                 +---------+---------------+---------+-----------+----------+--------------+ SFJ      Full                                                        +---------+---------------+---------+-----------+----------+--------------+ FV Prox  Full                                                        +---------+---------------+---------+-----------+----------+--------------+ FV Mid   Full                                                        +---------+---------------+---------+-----------+----------+--------------+  FV DistalFull                                                        +---------+---------------+---------+-----------+----------+--------------+ PFV      Full                                                        +---------+---------------+---------+-----------+----------+--------------+ POP      Full           Yes      Yes                                 +---------+---------------+---------+-----------+----------+--------------+ PTV      Full                                                        +---------+---------------+---------+-----------+----------+--------------+ PERO     Full                                                         +---------+---------------+---------+-----------+----------+--------------+ Gastroc  Full                                                        +---------+---------------+---------+-----------+----------+--------------+     Summary: RIGHT: - No evidence of common femoral vein obstruction.  LEFT: - There is no evidence of deep vein thrombosis in the lower extremity.  - No cystic structure found in the popliteal fossa.  *See table(s) above for measurements and observations. Electronically signed by Monica Martinez MD on 08/19/2021 at 5:23:22 PM.    Final

## 2021-08-21 ENCOUNTER — Inpatient Hospital Stay (HOSPITAL_COMMUNITY): Payer: Medicare Other

## 2021-08-21 DIAGNOSIS — I5031 Acute diastolic (congestive) heart failure: Secondary | ICD-10-CM | POA: Diagnosis not present

## 2021-08-21 DIAGNOSIS — J9601 Acute respiratory failure with hypoxia: Secondary | ICD-10-CM | POA: Diagnosis not present

## 2021-08-21 LAB — CBC WITH DIFFERENTIAL/PLATELET
Abs Immature Granulocytes: 0.06 10*3/uL (ref 0.00–0.07)
Basophils Absolute: 0 10*3/uL (ref 0.0–0.1)
Basophils Relative: 0 %
Eosinophils Absolute: 0 10*3/uL (ref 0.0–0.5)
Eosinophils Relative: 0 %
HCT: 31.8 % — ABNORMAL LOW (ref 39.0–52.0)
Hemoglobin: 10.2 g/dL — ABNORMAL LOW (ref 13.0–17.0)
Immature Granulocytes: 7 %
Lymphocytes Relative: 37 %
Lymphs Abs: 0.4 10*3/uL — ABNORMAL LOW (ref 0.7–4.0)
MCH: 27.4 pg (ref 26.0–34.0)
MCHC: 32.1 g/dL (ref 30.0–36.0)
MCV: 85.5 fL (ref 80.0–100.0)
Monocytes Absolute: 0.3 10*3/uL (ref 0.1–1.0)
Monocytes Relative: 34 %
Neutro Abs: 0.2 10*3/uL — CL (ref 1.7–7.7)
Neutrophils Relative %: 22 %
Platelets: 115 10*3/uL — ABNORMAL LOW (ref 150–400)
RBC: 3.72 MIL/uL — ABNORMAL LOW (ref 4.22–5.81)
RDW: 16.5 % — ABNORMAL HIGH (ref 11.5–15.5)
WBC: 0.9 10*3/uL — CL (ref 4.0–10.5)
nRBC: 0 % (ref 0.0–0.2)

## 2021-08-21 LAB — ECHOCARDIOGRAM LIMITED
Area-P 1/2: 3.46 cm2
Calc EF: 46.1 %
Height: 76 in
MV M vel: 5.45 m/s
MV Peak grad: 118.8 mmHg
Radius: 0.5 cm
S' Lateral: 2.6 cm
Single Plane A2C EF: 46.3 %
Single Plane A4C EF: 48.3 %
Weight: 4352 oz

## 2021-08-21 LAB — BASIC METABOLIC PANEL
Anion gap: 7 (ref 5–15)
BUN: 41 mg/dL — ABNORMAL HIGH (ref 8–23)
CO2: 24 mmol/L (ref 22–32)
Calcium: 7.7 mg/dL — ABNORMAL LOW (ref 8.9–10.3)
Chloride: 101 mmol/L (ref 98–111)
Creatinine, Ser: 2.09 mg/dL — ABNORMAL HIGH (ref 0.61–1.24)
GFR, Estimated: 32 mL/min — ABNORMAL LOW (ref >60)
Glucose, Bld: 139 mg/dL — ABNORMAL HIGH (ref 70–99)
Potassium: 3.5 mmol/L (ref 3.5–5.1)
Sodium: 132 mmol/L — ABNORMAL LOW (ref 135–145)

## 2021-08-21 LAB — IGG, IGA, IGM
IgA: 60 mg/dL — ABNORMAL LOW (ref 61–437)
IgG (Immunoglobin G), Serum: 395 mg/dL — ABNORMAL LOW (ref 603–1613)
IgM (Immunoglobulin M), Srm: 60 mg/dL (ref 15–143)

## 2021-08-21 MED ORDER — SODIUM CHLORIDE 0.9 % IV SOLN: INTRAVENOUS | Status: DC | PRN

## 2021-08-21 MED ORDER — FUROSEMIDE 10 MG/ML IJ SOLN
80.0000 mg | Freq: Two times a day (BID) | INTRAMUSCULAR | Status: DC
  Administered 2021-08-21 – 2021-08-23 (×5): 80 mg via INTRAVENOUS
  Filled 2021-08-21 (×5): qty 8

## 2021-08-21 MED ORDER — TBO-FILGRASTIM 480 MCG/0.8ML ~~LOC~~ SOSY
480.0000 ug | PREFILLED_SYRINGE | Freq: Every day | SUBCUTANEOUS | Status: DC
  Administered 2021-08-21 – 2021-08-22 (×2): 480 ug via SUBCUTANEOUS
  Filled 2021-08-21 (×2): qty 0.8

## 2021-08-21 NOTE — Progress Notes (Signed)
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  Dyspnea has been going on for last 3 months.  He follows with pulmonology.  He is on 20 mg of prednisone chronically as per oncology.  His last appointment with pulmonology was on 11/2.  Patient's reported fever of 103 Fahrenheit at home.  He was diagnosed with COVID back in February but continues to be positive.  He is off chemotherapy for the last 3 months due to persistent respiratory issues, history of recent weight loss.  On presentation he was afebrile, normotensive but hypoxic on room air with 85% saturation.  He was placed on 4 L of nasal cannula.  Lab work showed pancytopenia .CT chest showed new moderate right pleural effusion, diffuse right peripheral solid and groundglass nodular densities representing infectious versus neoplastic process.  Started on azithromycin and ceftriaxone for pneumonia.  Also started on Lasix IV for severe volume overload, echocardiogram ordered to rule out CHF.  Assessment & Plan:   Principal Problem:   Acute respiratory failure with hypoxia (HCC) Active Problems:   Diabetes mellitus, type II (HCC)   Diffuse large B cell lymphoma (HCC)   Thrombocytopenia (HCC)   Chronic kidney disease (CKD), 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HCC)   PAF (paroxysmal atrial fibrillation) (HCC)   COPD (chronic obstructive pulmonary disease) ([place])   Community acquired pneumonia   Pleural effusion   Leukopenia   Neutropenia (HCC)   Hypothyroidism   Acute respiratory acidosis (HCC)   Acute hypoxic respiratory failure: Hypoxia on presentation.  Thought to be  secondary to possible right-sided pneumonia/right-sided pleural effusion, and possibly secondary to pulmonary edema.  Also has history of COPD, history of pneumonitis from COVID/chemotherapy.  CT chest also showed diffuse right peripheral solid and groundglass nodular densities representing infectious versus neoplastic process.  He was put on 4 L of oxygen per minute Started on management for community-acquired pneumonia.  Currently on Rocephin, azithromycin. He follows with Dr. [name].  He is not on home oxygen.  We will try to taper the oxygen  Right-sided pleural effusion: Follow-up US chest showed only small right pleural effusion, thoracentesis not performed.  Volume overload: Had severe bilateral lower extremity edema. Elevated  BNP, started on  Lasix 80 mg IV twice a day.  He still looks volume overloaded..  We will check echocardiogram.  Needs to rule out chemotherapy-induced cardiomyopathy.  Pancytopenia: Secondary to history of large B-cell lymphoma/chemotherapy.  Currently afebrile, ANC is very low. He follows with Dr. Alvy Bimler.  Oncology following here.  He started off Granix  History of paroxysmal A. fib/a flutter: On Xarelto which will be continued.  Also on amiodarone and metoprolol.  History of coronary artery disease: On statin, beta-blocker  Diabetes type 2: Monitor blood sugars.  Continue sliding scale  insulin for now.  Hypothyroidism: On levothyroxine  CKD stage IV: Currently creatinine at baseline and improving with diuresis.  Continue to monitor.  Avoid nephrotoxins  Debility/deconditioning/weakness: Has been very weak recently.  We will request for PT/OT evaluation.  Lives with his son at home  Nutrition Problem: Moderate Malnutrition Etiology: chronic illness, cancer and cancer related treatments      DVT prophylaxis:Xarelto Code Status:  Full Family Communication: Significant other at bedside Patient status:Inpatient  Dispo: The patient is from: Home               Anticipated d/c is to: Home              Anticipated d/c date is: in 1-2 days  Consultants: Oncology  Procedures:None  Antimicrobials:  Anti-infectives (From admission, onward)    Start     Dose/Rate Route Frequency Ordered Stop   08/19/21 2200  cefTRIAXone (ROCEPHIN) 2 g in sodium chloride 0.9 % 100 mL IVPB        2 g 200 mL/hr over 30 Minutes Intravenous Every 24 hours 08/03/2021 2314 08/24/21 2159   08/19/21 2000  azithromycin (ZITHROMAX) 500 mg in sodium chloride 0.9 % 250 mL IVPB        500 mg 250 mL/hr over 60 Minutes Intravenous Every 24 hours 07/21/2021 2314 08/24/21 1959   07/25/2021 2030  cefTRIAXone (ROCEPHIN) 1 g in sodium chloride 0.9 % 100 mL IVPB        1 g 200 mL/hr over 30 Minutes Intravenous  Once 08/11/2021 2018 08/08/2021 2117   08/11/2021 2030  azithromycin (ZITHROMAX) 500 mg in sodium chloride 0.9 % 250 mL IVPB        500 mg 250 mL/hr over 60 Minutes Intravenous  Once 08/07/2021 2018 07/29/2021 2321       Subjective:  Patient seen and examined at the bedside this morning.  Hemodynamically stable.  Lying on the bed.  Complains of exertional dyspnea.  He does not complain of weakness when lying on the bed.  Still looks mildly volume overloaded.  Still on 4 L of oxygen per minute. Objective: Vitals:   08/21/21 0000 08/21/21 0400 08/21/21 0459 08/21/21 0500  BP: (!) 141/73 136/76 127/64 127/64  Pulse: 67 74 74 73  Resp: (!) 25 (!) 23 19 15   Temp:   98 F (36.7 C)   TempSrc:   Oral   SpO2: 94% 91% 92% 93%  Weight:      Height:        Intake/Output Summary (Last 24 hours) at 08/21/2021 0736 Last data filed at 08/21/2021 0600 Gross per 24 hour  Intake --  Output 900 ml  Net -900 ml   Filed Weights   08/19/21 1526  Weight: 123.4 kg    Examination:  General exam: Chronically ill looking, weak, deconditioned, morbidly obese HEENT: PERRL Respiratory system: Diminished air entry bilaterally  cardiovascular system: S1 & S2 heard, RRR.   [place] Gastrointestinal system: Abdomen is nondistended, soft and nontender. Central nervous system: Alert and oriented Extremities: trace  edema on bilateral lower extremities, no clubbing ,no cyanosis Skin: No rashes, no ulcers,no icterus    Data Reviewed: I have personally reviewed following labs and imaging studies  CBC: Recent Labs  Lab 08/11/2021 1551 08/19/21 0434 08/20/21 0403 08/21/21 0432  WBC 2.3* 1.3* 1.2* 0.9*  NEUTROABS 1.5*  --  0.4* 0.2*  HGB 11.3* 10.0* 10.3* 10.2*  HCT 35.8* 31.4* 32.3* 31.8*  MCV 87.3 85.6 85.2 85.5  PLT 135* 119* 124* 694*   Basic Metabolic Panel: Recent Labs  Lab 07/31/2021 1551 08/20/21 0403 08/21/21 0432  NA 134* 135 132*  K 4.4 3.5 3.5  CL 104 104 101  CO2 19* 22 24  GLUCOSE 190* 109* 139*  BUN 37* 35* 41*  CREATININE 2.13* 1.98* 2.09*  CALCIUM 8.4* 7.9* 7.7*   GFR: Estimated Creatinine Clearance: 41.1 mL/min (A) (by C-G formula based  on SCr of 2.09 mg/dL (H)). Liver Function Tests: Recent Labs  Lab 08/06/2021 1551  AST 33  ALT 15  ALKPHOS 62  BILITOT 0.8  PROT 6.3*  ALBUMIN 2.7*   No results for input(s): LIPASE, AMYLASE in the last 168 hours. No results for input(s): AMMONIA in the last 168 hours. Coagulation Profile: No results for input(s): INR, PROTIME in the last 168 hours. Cardiac Enzymes: No results for input(s): CKTOTAL, CKMB, CKMBINDEX, TROPONINI in the last 168 hours. BNP (last 3 results) No results for input(s): PROBNP in the last 8760 hours. HbA1C: No results for input(s): HGBA1C in the last 72 hours. CBG: No results for input(s): GLUCAP in the last 168 hours. Lipid Profile: No results for input(s): CHOL, HDL, LDLCALC, TRIG, CHOLHDL, LDLDIRECT in the last 72 hours. Thyroid Function Tests: No results for input(s): TSH, T4TOTAL, FREET4, T3FREE, THYROIDAB in the last 72 hours. Anemia Panel: No results for input(s): VITAMINB12, FOLATE, FERRITIN, TIBC, IRON, RETICCTPCT in the last 72 hours. Sepsis  Labs: Recent Labs  Lab 07/29/2021 2206  LATICACIDVEN 1.3    Recent Results (from the past 240 hour(s))  Resp Panel by RT-PCR (Flu A&B, Covid) Nasopharyngeal Swab     Status: Abnormal   Collection Time: 08/06/2021  3:52 PM   Specimen: Nasopharyngeal Swab; Nasopharyngeal(NP) swabs in vial transport medium  Result Value Ref Range Status   SARS Coronavirus 2 by RT PCR POSITIVE (A) NEGATIVE Final    Comment: CRITICAL RESULT CALLED TO, READ BACK BY AND VERIFIED WITH: RN N WHEATLEY AT 1957 08/04/2021 [place] A (NOTE) SARS-CoV-2 target nucleic acids are DETECTED.  The SARS-CoV-2 RNA is generally detectable in upper respiratory specimens during the acute phase of infection. Positive results are indicative of the presence of the identified virus, but do not rule out bacterial infection or co-infection with other pathogens not detected by the test. Clinical correlation with patient history and other diagnostic information is necessary to determine patient infection status. The expected result is Negative.  Fact Sheet for Patients: EntrepreneurPulse.com.au  Fact Sheet for Healthcare Providers: IncredibleEmployment.be  This test is not yet approved or cleared by the [place] FDA and  has been authorized for detection and/or diagnosis of SARS-CoV-2 by FDA under an Emergency Use Authorization (EUA).  This EUA will remain in effect (mean ing this test can be used) for the duration of  the COVID-19 declaration under Section 564(b)(1) of the Act, 21 U.S.C. section 360bbb-3(b)(1), unless the authorization is terminated or revoked sooner.     Influenza A by PCR NEGATIVE NEGATIVE Final   Influenza B by PCR NEGATIVE NEGATIVE Final    Comment: (NOTE) The Xpert Xpress SARS-CoV-2/FLU/RSV plus assay is intended as an aid in the diagnosis of influenza from Nasopharyngeal swab specimens and should not be used as a sole basis for treatment. Nasal washings  and aspirates are unacceptable for Xpert Xpress SARS-CoV-2/FLU/RSV testing.  Fact Sheet for Patients: EntrepreneurPulse.com.au  Fact Sheet for Healthcare Providers: IncredibleEmployment.be  This test is not yet approved or cleared by the [place] FDA and has been authorized for detection and/or diagnosis of SARS-CoV-2 by FDA under an Emergency Use Authorization (EUA). This EUA will remain in effect (meaning this test can be used) for the duration of the COVID-19 declaration under Section 564(b)(1) of the Act, 21 U.S.C. section 360bbb-3(b)(1), unless the authorization is terminated or revoked.  Performed at [facility], [place] [address], [place], [place] [address]   Culture, blood (Routine X 2) w Reflex to  ID Panel     Status: None (Preliminary result)   Collection Time: 08/11/2021 10:02 PM   Specimen: BLOOD  Result Value Ref Range Status   Specimen Description   Final    BLOOD PORTA CATH Performed at [facility] [address] [place], [place] [address]    Special Requests   Final    BOTTLES DRAWN AEROBIC AND ANAEROBIC Blood Culture adequate volume Performed at [facility] [address], [place], [place] [address]    Culture   Final    NO GROWTH 1 DAY Performed at [facility], [place] [address] [place], [place] [address]    Report Status PENDING  Incomplete  Culture, blood (Routine X 2) w Reflex to ID Panel     Status: None (Preliminary result)   Collection Time: 08/19/21  4:24 AM   Specimen: BLOOD  Result Value Ref Range Status   Specimen Description   Final    BLOOD PORTA CATH Performed at [facility] [address] [place], [place] [address]    Special Requests   Final    BOTTLES DRAWN AEROBIC AND ANAEROBIC Blood Culture results may not be optimal due to an excessive volume of blood received in culture bottles Performed at [facility] [address], [place], [place] [address]    Culture   Final    NO GROWTH 1 DAY Performed at [facility], [place] [address] [place],  [address]    Report Status PENDING  Incomplete         Radiology Studies: [place] CHEST (PLEURAL EFFUSION)  Result Date: 08/19/2021 CLINICAL DATA:  Evaluate for thoracentesis. EXAM: CHEST ULTRASOUND COMPARISON:  Chest CT 07/24/2021 FINDINGS: Small amount of right pleural fluid but there is not a good percutaneous window. IMPRESSION: Small right pleural effusion. Thoracentesis not performed due to small volume and lack of safe percutaneous window. Electronically Signed   By: Markus Daft M.D.   On: 08/19/2021 13:40        Scheduled Meds:  (feeding supplement) PROSource Plus  30 mL Oral BID BM   amiodarone  200 mg Oral Daily   Chlorhexidine Gluconate Cloth  6 each Topical Daily   feeding supplement  237 mL Oral BID BM   levothyroxine  25 mcg Oral Q0600   metoprolol succinate  12.5 mg Oral Daily   mometasone-formoterol  2 puff Inhalation BID   multivitamin with minerals  1 tablet Oral Daily   rivaroxaban  20 mg Oral Q supper   rosuvastatin  10 mg Oral Daily   tamsulosin  0.4 mg Oral Daily   umeclidinium bromide  1 puff Inhalation Daily   Continuous Infusions:  azithromycin 500 mg (08/20/21 2148)   cefTRIAXone (ROCEPHIN)  IV 2 g (08/20/21 2325)     LOS: 3 days    Time spent:35 mins. More than 50% of that time was spent in counseling and/or coordination of care.      Shelly Coss, MD [facility] P12/11/2020, 7:36 AM

## 2021-08-21 NOTE — Progress Notes (Signed)
  Echocardiogram 2D Echocardiogram has been performed.  Fidel Levy 08/21/2021, 3:39 PM

## 2021-08-22 ENCOUNTER — Inpatient Hospital Stay (HOSPITAL_COMMUNITY): Payer: Medicare Other

## 2021-08-22 DIAGNOSIS — I509 Heart failure, unspecified: Secondary | ICD-10-CM

## 2021-08-22 DIAGNOSIS — J9601 Acute respiratory failure with hypoxia: Secondary | ICD-10-CM | POA: Diagnosis not present

## 2021-08-22 LAB — RESPIRATORY PANEL BY PCR

## 2021-08-22 LAB — CBC WITH DIFFERENTIAL/PLATELET
Abs Immature Granulocytes: 0.27 10*3/uL — ABNORMAL HIGH (ref 0.00–0.07)
Basophils Absolute: 0 10*3/uL (ref 0.0–0.1)
Basophils Relative: 0 %
Eosinophils Absolute: 0 10*3/uL (ref 0.0–0.5)
Eosinophils Relative: 0 %
HCT: 32.7 % — ABNORMAL LOW (ref 39.0–52.0)
Hemoglobin: 10.4 g/dL — ABNORMAL LOW (ref 13.0–17.0)
Immature Granulocytes: 15 %
Lymphocytes Relative: 18 %
Lymphs Abs: 0.3 10*3/uL — ABNORMAL LOW (ref 0.7–4.0)
MCH: 27 pg (ref 26.0–34.0)
MCHC: 31.8 g/dL (ref 30.0–36.0)
MCV: 84.9 fL (ref 80.0–100.0)
Monocytes Absolute: 0.3 10*3/uL (ref 0.1–1.0)
Monocytes Relative: 18 %
Neutro Abs: 0.9 10*3/uL — ABNORMAL LOW (ref 1.7–7.7)
Neutrophils Relative %: 49 %
Platelets: 107 10*3/uL — ABNORMAL LOW (ref 150–400)
RBC: 3.85 MIL/uL — ABNORMAL LOW (ref 4.22–5.81)
RDW: 16.5 % — ABNORMAL HIGH (ref 11.5–15.5)
WBC: 1.8 10*3/uL — ABNORMAL LOW (ref 4.0–10.5)
nRBC: 0 % (ref 0.0–0.2)

## 2021-08-22 LAB — BLOOD GAS, ARTERIAL
Acid-base deficit: 2.7 mmol/L — ABNORMAL HIGH (ref 0.0–2.0)
Bicarbonate: 20.4 mmol/L (ref 20.0–28.0)
O2 Saturation: 96.9 %
Patient temperature: 98.6
pCO2 arterial: 31.7 mmHg — ABNORMAL LOW (ref 32.0–48.0)
pH, Arterial: 7.425 (ref 7.350–7.450)
pO2, Arterial: 89.7 mmHg (ref 83.0–108.0)

## 2021-08-22 LAB — ECHOCARDIOGRAM LIMITED
Calc EF: 59 %
Height: 76 in
Single Plane A2C EF: 57.6 %
Single Plane A4C EF: 58 %
Weight: 4285.74 oz

## 2021-08-22 LAB — BASIC METABOLIC PANEL
Anion gap: 10 (ref 5–15)
BUN: 45 mg/dL — ABNORMAL HIGH (ref 8–23)
CO2: 23 mmol/L (ref 22–32)
Calcium: 7.7 mg/dL — ABNORMAL LOW (ref 8.9–10.3)
Chloride: 99 mmol/L (ref 98–111)
Creatinine, Ser: 2.05 mg/dL — ABNORMAL HIGH (ref 0.61–1.24)
GFR, Estimated: 32 mL/min — ABNORMAL LOW (ref >60)
Glucose, Bld: 220 mg/dL — ABNORMAL HIGH (ref 70–99)
Potassium: 3.5 mmol/L (ref 3.5–5.1)
Sodium: 132 mmol/L — ABNORMAL LOW (ref 135–145)

## 2021-08-22 LAB — MRSA NEXT GEN BY PCR, NASAL: MRSA by PCR Next Gen: NOT DETECTED

## 2021-08-22 LAB — SEDIMENTATION RATE: Sed Rate: 105 mm/hr — ABNORMAL HIGH (ref 0–16)

## 2021-08-22 LAB — PROCALCITONIN: Procalcitonin: 0.21 ng/mL

## 2021-08-22 MED ORDER — PERFLUTREN LIPID MICROSPHERE
1.0000 mL | INTRAVENOUS | Status: AC | PRN
  Administered 2021-08-22: 13:00:00 2 mL via INTRAVENOUS

## 2021-08-22 MED ORDER — ORAL CARE MOUTH RINSE
15.0000 mL | Freq: Two times a day (BID) | OROMUCOSAL | Status: DC
  Administered 2021-08-22 – 2021-08-25 (×7): 15 mL via OROMUCOSAL

## 2021-08-22 NOTE — Progress Notes (Signed)
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  Dyspnea has been going on for last 3 months.  He follows with pulmonology.  He is on 20 mg of prednisone chronically as per oncology.  His last appointment with pulmonology was on 11/2.  Patient's reported fever of 103 Fahrenheit at home.  He was diagnosed with COVID back in February but continues to be positive.  He is off chemotherapy for the last 3 months due to persistent respiratory issues, history of recent weight loss.  On presentation he was afebrile, normotensive but hypoxic on room air with 85% saturation.  He was placed on 4 L of nasal cannula.  Lab work showed pancytopenia .CT chest showed new moderate right pleural effusion, diffuse right peripheral solid and groundglass nodular densities representing infectious versus neoplastic process.  Started on azithromycin and ceftriaxone for pneumonia.  Also started on Lasix IV for severe volume overload, echocardiogram ordered to rule out CHF.  Hospital course remarkable for worsening respiratory status, requiring 15 L of oxygen today.  Patient being transferred to ICU.  PCCM and cardiology consulted.  Assessment & Plan:   Principal Problem:   Acute respiratory failure with hypoxia (HCC) Active Problems:   Diabetes mellitus, type II (HCC)   Diffuse large B cell lymphoma (HCC)   Thrombocytopenia (HCC)   Chronic kidney disease (CKD), 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HCC)   PAF (paroxysmal atrial fibrillation) (HCC)   COPD (chronic obstructive pulmonary disease) ([place])   Community acquired pneumonia   Pleural effusion    Leukopenia   Neutropenia (HCC)   Hypothyroidism   Acute respiratory acidosis (HCC)   Acute hypoxic respiratory failure: Hypoxia on presentation.  Thought to be secondary to possible right-sided pneumonia/right-sided pleural effusion, and possibly secondary to pulmonary edema.  Also has history of COPD, history of pneumonitis from COVID/chemotherapy.  CT chest also showed diffuse right peripheral solid and groundglass nodular densities representing infectious versus neoplastic process.  He was put on 4 L of oxygen per minute on presentation Started on management for community-acquired pneumonia.  Currently on Rocephin, azithromycin. He follows with Dr. [name].  He is not on home oxygen.   Patient decompensated now requiring more oxygen.  Will check chest x-ray and ABG.  We are transferring discharge on.  PCCM consulted.  Right-sided pleural effusion: [place] thora centered  Congestive heart failure/volume overload: Had severe bilateral lower extremity edema. Elevated  BNP, started on  Lasix 80 mg IV twice a day. Needs to rule out chemotherapy-induced cardiomyopathy.  Echo done here showed inferior/inferoseptal wall hypokinesis, moderate left ventricular hypertrophy, low normal right ventricular systolic function, moderate mitral valve regurgitation.  Cardiology consulted  Pancytopenia: Secondary to history of large B-cell lymphoma/chemotherapy.  Currently afebrile, ANC is very low. He follows with Dr. Alvy Bimler.  Oncology following here.  On Granix  History of paroxysmal A. fib/a flutter: On Xarelto which will be continued.  Also on amiodarone and metoprolol.  History of coronary artery disease: On statin, beta-blocker  Diabetes type 2: Monitor blood sugars.  Continue sliding scale  insulin for now.  Hypothyroidism: On levothyroxine  CKD stage IV: Currently creatinine at baseline and improving with diuresis.  Continue to  monitor.  Avoid nephrotoxins  Debility/deconditioning/weakness: Has been very  weak recently.  We will request for PT/OT evaluation.  Lives with his son at home  Nutrition Problem: Moderate Malnutrition Etiology: chronic illness, cancer and cancer related treatments      DVT prophylaxis:Xarelto Code Status: Full Family Communication: Significant other at bedside on 08/20/21 Patient status:Inpatient  Dispo: The patient is from: Home              Anticipated d/c is to: Home              Anticipated d/c date is: Not sure for now  Consultants: Oncology  Procedures:None  Antimicrobials:  Anti-infectives (From admission, onward)    Start     Dose/Rate Route Frequency Ordered Stop   08/19/21 2200  cefTRIAXone (ROCEPHIN) 2 g in sodium chloride 0.9 % 100 mL IVPB        2 g 200 mL/hr over 30 Minutes Intravenous Every 24 hours 08/15/2021 2314 08/24/21 2159   08/19/21 2000  azithromycin (ZITHROMAX) 500 mg in sodium chloride 0.9 % 250 mL IVPB        500 mg 250 mL/hr over 60 Minutes Intravenous Every 24 hours 07/31/2021 2314 08/24/21 1959   08/12/2021 2030  cefTRIAXone (ROCEPHIN) 1 g in sodium chloride 0.9 % 100 mL IVPB        1 g 200 mL/hr over 30 Minutes Intravenous  Once 07/20/2021 2018 07/25/2021 2117   07/27/2021 2030  azithromycin (ZITHROMAX) 500 mg in sodium chloride 0.9 % 250 mL IVPB        500 mg 250 mL/hr over 60 Minutes Intravenous  Once 07/20/2021 2018 08/11/2021 2321       Subjective:  Patient seen and examined the bedside this morning.  Rapid response was called early this morning because of high requirement of oxygen.  He was on 15 L of oxygen during my evaluation.  He was saturating fine on 15 L of oxygen.  Complains of shortness of breath even at rest.  Being transferred to stepdown.  Objective: Vitals:   08/21/21 2200 08/21/21 2300 08/22/21 0000 08/22/21 0503  BP:    115/71  Pulse: 74 72 78 74  Resp: (!) 28 (!) 26 (!) 24 (!) 22  Temp: 98.1 F (36.7 C)   97.6 F (36.4 C)  TempSrc: Oral   Oral  SpO2: 95% 94% 94% 95%  Weight:      Height:         Intake/Output Summary (Last 24 hours) at 08/22/2021 0815 Last data filed at 08/22/2021 0505 Gross per 24 hour  Intake 1965 ml  Output 1545 ml  Net 420 ml   Filed Weights   08/19/21 1526  Weight: 123.4 kg    Examination:  General exam: Very deconditioned, chronically ill looking, obese HEENT: PERRL Respiratory system: Diminished air sounds bilaterally more on the right, no wheezes or crackles  Cardiovascular system: S1 & S2 heard, RRR.  Chemo-Port on the chest Gastrointestinal system: Abdomen is nondistended, soft and nontender. Central nervous system: Alert and oriented Extremities: No edema, no clubbing ,no cyanosis Skin: No rashes, no ulcers,no icterus    Data Reviewed: I have personally reviewed following labs and imaging studies  CBC: Recent Labs  Lab 08/01/2021 1551 08/19/21 0434 08/20/21 0403 08/21/21 0432 08/22/21 0409  WBC 2.3* 1.3* 1.2* 0.9* 1.8*  NEUTROABS 1.5*  --  0.4* 0.2* 0.9*  HGB 11.3* 10.0* 10.3* 10.2* 10.4*  HCT 35.8* 31.4* 32.3* 31.8* 32.7*  MCV 87.3 85.6 85.2  85.5 84.9  PLT 135* 119* 124* 115* 063*   Basic Metabolic Panel: Recent Labs  Lab 08/01/2021 1551 08/20/21 0403 08/21/21 0432 08/22/21 0409  NA 134* 135 132* 132*  K 4.4 3.5 3.5 3.5  CL 104 104 101 99  CO2 19* 22 24 23   GLUCOSE 190* 109* 139* 220*  BUN 37* 35* 41* 45*  CREATININE 2.13* 1.98* 2.09* 2.05*  CALCIUM 8.4* 7.9* 7.7* 7.7*   GFR: Estimated Creatinine Clearance: 41.9 mL/min (A) (by C-G formula based on SCr of 2.05 mg/dL (H)). Liver Function Tests: Recent Labs  Lab 08/12/2021 1551  AST 33  ALT 15  ALKPHOS 62  BILITOT 0.8  PROT 6.3*  ALBUMIN 2.7*   No results for input(s): LIPASE, AMYLASE in the last 168 hours. No results for input(s): AMMONIA in the last 168 hours. Coagulation Profile: No results for input(s): INR, PROTIME in the last 168 hours. Cardiac Enzymes: No results for input(s): CKTOTAL, CKMB, CKMBINDEX, TROPONINI in the last 168 hours. BNP (last 3  results) No results for input(s): PROBNP in the last 8760 hours. HbA1C: No results for input(s): HGBA1C in the last 72 hours. CBG: No results for input(s): GLUCAP in the last 168 hours. Lipid Profile: No results for input(s): CHOL, HDL, LDLCALC, TRIG, CHOLHDL, LDLDIRECT in the last 72 hours. Thyroid Function Tests: No results for input(s): TSH, T4TOTAL, FREET4, T3FREE, THYROIDAB in the last 72 hours. Anemia Panel: No results for input(s): VITAMINB12, FOLATE, FERRITIN, TIBC, IRON, RETICCTPCT in the last 72 hours. Sepsis Labs: Recent Labs  Lab 08/06/2021 2206  LATICACIDVEN 1.3    Recent Results (from the past 240 hour(s))  Resp Panel by RT-PCR (Flu A&B, Covid) Nasopharyngeal Swab     Status: Abnormal   Collection Time: 07/28/2021  3:52 PM   Specimen: Nasopharyngeal Swab; Nasopharyngeal(NP) swabs in vial transport medium  Result Value Ref Range Status   SARS Coronavirus 2 by RT PCR POSITIVE (A) NEGATIVE Final    Comment: CRITICAL RESULT CALLED TO, READ BACK BY AND VERIFIED WITH: RN N WHEATLEY AT 1957 08/15/2021 CRUICKSHANK A (NOTE) SARS-CoV-2 target nucleic acids are DETECTED.  The SARS-CoV-2 RNA is generally detectable in upper respiratory specimens during the acute phase of infection. Positive results are indicative of the presence of the identified virus, but do not rule out bacterial infection or co-infection with other pathogens not detected by the test. Clinical correlation with patient history and other diagnostic information is necessary to determine patient infection status. The expected result is Negative.  Fact Sheet for Patients: EntrepreneurPulse.com.au  Fact Sheet for Healthcare Providers: IncredibleEmployment.be  This test is not yet approved or cleared by the [place] FDA and  has been authorized for detection and/or diagnosis of SARS-CoV-2 by FDA under an Emergency Use Authorization (EUA).  This EUA will remain in effect  (mean ing this test can be used) for the duration of  the COVID-19 declaration under Section 564(b)(1) of the Act, 21 U.S.C. section 360bbb-3(b)(1), unless the authorization is terminated or revoked sooner.     Influenza A by PCR NEGATIVE NEGATIVE Final   Influenza B by PCR NEGATIVE NEGATIVE Final    Comment: (NOTE) The Xpert Xpress SARS-CoV-2/FLU/RSV plus assay is intended as an aid in the diagnosis of influenza from Nasopharyngeal swab specimens and should not be used as a sole basis for treatment. Nasal washings and aspirates are unacceptable for Xpert Xpress SARS-CoV-2/FLU/RSV testing.  Fact Sheet for Patients: EntrepreneurPulse.com.au  Fact Sheet for Healthcare Providers: IncredibleEmployment.be  This test is not  yet approved or cleared by the [place] and has been authorized for detection and/or diagnosis of SARS-CoV-2 by FDA under an Emergency Use Authorization (EUA). This EUA will remain in effect (meaning this test can be used) for the duration of the COVID-19 declaration under Section 564(b)(1) of the Act, 21 U.S.C. section 360bbb-3(b)(1), unless the authorization is terminated or revoked.  Performed at [facility], [place] [address], [place], [place] [address]   Culture, blood (Routine X 2) w Reflex to ID Panel     Status: None (Preliminary result)   Collection Time: 08/17/2021 10:02 PM   Specimen: BLOOD  Result Value Ref Range Status   Specimen Description   Final    BLOOD PORTA CATH Performed at [facility] [address] [place], [place] [address]    Special Requests   Final    BOTTLES DRAWN AEROBIC AND ANAEROBIC Blood Culture adequate volume Performed at [facility] [address] [place], [place] [address]    Culture   Final    NO GROWTH 2 DAYS Performed at [facility] [address] [place], [place] [address]    Report Status PENDING   Incomplete  Culture, blood (Routine X 2) w Reflex to ID Panel     Status: None (Preliminary result)   Collection Time: 08/19/21  4:24 AM   Specimen: BLOOD  Result Value Ref Range Status   Specimen Description   Final    BLOOD PORTA CATH Performed at [facility] [address], [place], [place] [address]    Special Requests   Final    BOTTLES DRAWN AEROBIC AND ANAEROBIC Blood Culture results may not be optimal due to an excessive volume of blood received in culture bottles Performed at [facility] [address], [place], [place] [address]    Culture   Final    NO GROWTH 2 DAYS Performed at [facility] [address], [place], [place] [address]    Report Status PENDING  Incomplete         Radiology Studies: ECHOCARDIOGRAM LIMITED  Result Date: 08/21/2021    ECHOCARDIOGRAM LIMITED REPORT   Patient Name:   Derek [name] Date of Exam: 08/21/2021 Medical Rec #:  [id number]   Height:       76.0 in Accession #:    [id number]  Weight:       272.0 lb Date of Birth:  18-Dec-1940   BSA:          2.524 m Patient Age:    89 years    BP:           127/64 mmHg Patient Gender: M           HR:           83 bpm. Exam Location:  Inpatient Procedure: Limited Echo, Limited Color Doppler and Cardiac Doppler Indications:    CHF-Acute Diastolic U44.03  History:        Patient has prior history of Echocardiogram examinations, most                 recent 06/07/2021. Cardiomyopathy; Risk Factors:Hypertension and                 Diabetes.  Sonographer:    Bernadene Person RDCS Referring Phys: [id number] Chakara Bognar IMPRESSIONS  1. Poor acoustic windows limt study LVEF may be mildly depressed REcomm limited echo with Defiinity to confirm wall motion  Images suggestive that inferior/inferoseptal walls are hypokinetic . There is moderate left ventricular hypertrophy.  2. Right ventricular systolic function is low normal. The right ventricular size is mildly enlarged. There is  normal pulmonary artery systolic pressure.  3. MR is eccentric, directed posterior into LA. Probable moderate . Moderate mitral valve regurgitation.  4. The aortic valve is tricuspid. Aortic valve regurgitation is not visualized. Aortic valve sclerosis/calcification is present, without any evidence of aortic stenosis.  5. Aortic dilatation noted. There is mild dilatation of the aortic root, measuring 41 mm. There is mild dilatation of the ascending aorta, measuring 38 mm.  6. The inferior vena cava is normal in size with greater than 50% respiratory variability, suggesting right atrial pressure of 3 mmHg. FINDINGS  Left Ventricle: Poor acoustic windows limt study LVEF may be mildly depressed REcomm limited echo with Defiinity to confirm wall motion Images suggestive that inferior/inferoseptal walls are hypokinetic. The left ventricular internal cavity size was normal in size. There is moderate left ventricular hypertrophy. Right Ventricle: The right ventricular size is mildly enlarged. Right ventricular systolic function is low normal. There is normal pulmonary artery systolic pressure. The tricuspid regurgitant velocity is 2.38 m/s, and with an assumed right atrial pressure of 3 mmHg, the estimated right ventricular systolic pressure is 74.2 mmHg. Left Atrium: Left atrial size was normal in size. Right Atrium: Right atrial size was normal in size. Pericardium: There is no evidence of pericardial effusion. Mitral Valve: MR is eccentric, directed posterior into LA. Probable moderate. There is mild thickening of the mitral valve leaflet(s). Moderate mitral valve regurgitation. Tricuspid Valve: The tricuspid valve is grossly normal. Tricuspid valve regurgitation is mild. Aortic Valve: The aortic valve is tricuspid. Aortic valve regurgitation is not visualized. Aortic valve sclerosis/calcification is present, without any evidence of aortic stenosis. Pulmonic Valve: The pulmonic valve was not well visualized. Pulmonic  valve regurgitation is not visualized. Aorta: Aortic dilatation noted. There is mild dilatation of the aortic root, measuring 41 mm. There is mild dilatation of the ascending aorta, measuring 38 mm. Venous: The inferior vena cava is normal in size with greater than 50% respiratory variability, suggesting right atrial pressure of 3 mmHg. IAS/Shunts: The interatrial septum was not assessed. LEFT VENTRICLE PLAX 2D LVIDd:         4.50 cm     Diastology LVIDs:         2.60 cm     LV e' medial:    3.38 cm/s LV PW:         1.50 cm     LV E/e' medial:  19.7 LV IVS:        1.30 cm     LV e' lateral:   6.08 cm/s LVOT diam:     2.10 cm     LV E/e' lateral: 10.9 LV SV:         62 LV SV Index:   25 LVOT Area:     3.46 cm  LV Volumes (MOD) LV vol d, MOD A2C: 96.0 ml LV vol d, MOD A4C: 81.0 ml LV vol s, MOD A2C: 51.6 ml LV vol s, MOD A4C: 41.9 ml LV SV MOD A2C:     44.4 ml LV SV MOD A4C:     81.0 ml LV SV MOD BP:      41.0 ml RIGHT VENTRICLE RV S prime:     13.50 cm/s TAPSE (M-mode): 2.5 cm LEFT ATRIUM           Index LA  diam:      3.90 cm 1.54 cm/m LA Vol (A4C): 46.2 ml 18.30 ml/m  AORTIC VALVE LVOT Vmax:   103.00 cm/s LVOT Vmean:  69.400 cm/s LVOT VTI:    0.180 m  AORTA Ao Root diam: 4.10 cm Ao Asc diam:  3.80 cm MITRAL VALVE                  TRICUSPID VALVE MV Area (PHT): 3.46 cm       TR Peak grad:   22.7 mmHg MV Decel Time: 219 msec       TR Vmax:        238.00 cm/s MR Peak grad:    118.8 mmHg MR Mean grad:    81.0 mmHg    SHUNTS MR Vmax:         545.00 cm/s  Systemic VTI:  0.18 m MR Vmean:        423.0 cm/s   Systemic Diam: 2.10 cm MR PISA:         1.57 cm MR PISA Eff ROA: 11 mm MR PISA Radius:  0.50 cm MV E velocity: 66.50 cm/s MV A velocity: 48.00 cm/s MV E/A ratio:  1.39 Dorris Carnes MD Electronically signed by Dorris Carnes MD Signature Date/Time: 08/21/2021/4:03:52 PM    Final         Scheduled Meds:  (feeding supplement) PROSource Plus  30 mL Oral BID BM   amiodarone  200 mg Oral Daily   Chlorhexidine  Gluconate Cloth  6 each Topical Daily   feeding supplement  237 mL Oral BID BM   furosemide  80 mg Intravenous Q12H   levothyroxine  25 mcg Oral Q0600   metoprolol succinate  12.5 mg Oral Daily   mometasone-formoterol  2 puff Inhalation BID   multivitamin with minerals  1 tablet Oral Daily   rivaroxaban  20 mg Oral Q supper   rosuvastatin  10 mg Oral Daily   tamsulosin  0.4 mg Oral Daily   Tbo-filgastrim (GRANIX) SQ  480 mcg Subcutaneous q1800   umeclidinium bromide  1 puff Inhalation Daily   Continuous Infusions:  sodium chloride 10 mL/hr at 08/21/21 1030   azithromycin 500 mg (08/21/21 2045)   cefTRIAXone (ROCEPHIN)  IV 2 g (08/21/21 2320)     LOS: 4 days    Time spent:35 mins. More than 50% of that time was spent in counseling and/or coordination of care.      Shelly Coss, MD [facility] P12/12/2020, 8:15 AM

## 2021-08-22 NOTE — Consult Note (Signed)
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  Treated with chemotherapy and Rituxan.    Had COVID in February versus August 2022 2022 and persistent PCR positivity since then.  60 pound weight loss in the last 8 months.  He also has worsening shortness of breath for the last 3 months.  During these 3 months he has been off his chemotherapy.  He is also on chronic prednisone since around October 2022 by hematology Dr. Simeon Craft such.      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  By May 2022 he developed a right lower lobe density.  He had CT-guided biopsy 03/08/2021 that shows atypical lymphoid infiltrate consistent with non-Hodgkin B-cell lymphoma.  In September 2022 PET scans do seem to be slightly getting better.  However, that is progressively worse mid November 2022 CT scan and also 08/14/2021 admit CT scan.  This fits in with his progressive hypoxemia.  The ILD changes itself appears to be somewhat worse but is hard to say..  The overwhelming complex here massive right lower lobe density -with air bronchograms that is getting worse.  [In September 2022 PET scan it seems to be completely getting better.]    Pulmonary consultation in the office 07/21/2021 Dr. Baltazar Apo was concerned about drug-induced lung injury versus long COVID residual infiltrates.  Plan was to continue prednisone for another month and then  taper to 10 mg for a month and then slowly taper off.  He then presented 08/17/2021 with fever of 103, hypoxemic 85% on room air corrected with 4 L nasal cannula, leukopenia 2.3 with a left shift and a hemoglobin of 5.3.  [Pancytopenia] CT chest at admission shows new onset moderate right oral effusion [too small for thoracentesis per ultrasound], right lower lobe atelectasis airspace disease worrisome for infection and new diffuse right lung peripheral solid and groundglass nodular densities along with groundglass and interstitial opacities in the left lung.  Back in May 2022 thoracic radiology consider some of these findings as "probable UIP".  His subcarinal lymph node itself is stable.  Is being treated with azithromycin and ceftriaxone.  Also IV Lasix for possible volume overload.  Despite this he is progressing requiring 15 L nasal cannula.  He was transferred to intensive care unit and pulmonary critical care medicine has been consulted.  Urine Streptococcus negative.  He had an echocardiogram for window: There is concern for slightly reduced ejection fraction probably related to chemotherapy.  Of note patient reported that his pedal edema has improved with Lasix although his hypoxemia is worse.  Denies any chest pain.    Past Medical History:    has a past medical history of Blood dyscrasia, CLL (chronic lymphocytic leukemia) ([place]) (09/10/2013), Diabetes mellitus, type II ([place]), Dilated cardiomyopathy ([place]) (09/14/2018), GERD (gastroesophageal reflux disease), H/O cardiac catheterization, H/O exercise stress test (1999, 2002), Hypertension, Hypertriglyceridemia, Lymphocytosis, Obesity, and Pulmonary Function Test (09/2018).   reports that he quit  smoking about 32 years ago. His smoking use included cigarettes. He has a 30.00 pack-year smoking history. He has never used smokeless tobacco.  Past Surgical History:  Procedure Laterality Date   CARDIOVERSION N/A 09/05/2018   Procedure:  CARDIOVERSION;  Surgeon: Jerline Pain, MD;  Location: [facility];  Service: Cardiovascular;  Laterality: N/A;   CARDIOVERSION N/A 10/22/2018   Procedure: CARDIOVERSION;  Surgeon: Jerline Pain, MD;  Location: [place];  Service: Cardiovascular;  Laterality: N/A;   CARDIOVERSION N/A 08/28/2020   Procedure: CARDIOVERSION;  Surgeon: Josue [name], MD;  Location: [place];  Service: Cardiovascular;  Laterality: N/A;   CARDIOVERSION N/A 11/30/2020   Procedure: CARDIOVERSION;  Surgeon: Jerline Pain, MD;  Location: [facility] ENDOSCOPY;  Service: Cardiovascular;  Laterality: N/A;   CARDIOVERSION N/A 04/22/2021   Procedure: CARDIOVERSION;  Surgeon: Buford Dresser, MD;  Location: [facility] ENDOSCOPY;  Service: Cardiovascular;  Laterality: N/A;   CHOLECYSTECTOMY N/A 07/12/2016   Procedure: LAPAROSCOPIC CHOLECYSTECTOMY;  Surgeon: Donnie Mesa, MD;  Location: [place];  Service: General;  Laterality: N/A;   EXTRACORPOREAL SHOCK WAVE LITHOTRIPSY Right 11/18/2019   Procedure: EXTRACORPOREAL SHOCK WAVE LITHOTRIPSY (ESWL);  Surgeon: Lucas Mallow, MD;  Location: [facility];  Service: Urology;  Laterality: Right;   FOOT SURGERY Right    IR IMAGING GUIDED PORT INSERTION  03/19/2021   NASAL SINUS SURGERY     TEE WITHOUT CARDIOVERSION N/A 09/05/2018   Procedure: TRANSESOPHAGEAL ECHOCARDIOGRAM (TEE);  Surgeon: Jerline Pain, MD;  Location: [facility] ENDOSCOPY;  Service: Cardiovascular;  Laterality: N/A;   TEE WITHOUT CARDIOVERSION N/A 08/28/2020   Procedure: TRANSESOPHAGEAL ECHOCARDIOGRAM (TEE);  Surgeon: Josue [name], MD;  Location: [facility] ENDOSCOPY;  Service: Cardiovascular;  Laterality: N/A;   TONSILLECTOMY     UMBILICAL HERNIA REPAIR N/A 07/12/2016   Procedure: [place];  Surgeon: Donnie Mesa, MD;  Location: [place];  Service: General;  Laterality: N/A;   vericose vein stripping      Allergies  Allergen Reactions   Lipitor [Atorvastatin] Rash   Metformin Diarrhea     Immunization History  Administered Date(s) Administered   Influenza Split 06/09/2009, 07/19/2011, 09/05/2012, 06/19/2014, 06/25/2015   Influenza, High Dose Seasonal PF 06/07/2016, 06/12/2017, 07/04/2018, 05/31/2019   Influenza,inj,Quad PF,6+ Mos 06/11/2014, 06/25/2015   Influenza-Unspecified 07/21/2019   PFIZER(Purple Top)SARS-COV-2 Vaccination 02/13/2020, 03/05/2020, 06/01/2020   Pneumococcal Conjugate-13 07/09/2014   Pneumococcal Polysaccharide-23 12/08/2009   Td 09/09/2002, 12/18/2012   Zoster Recombinat (Shingrix) 06/14/2019   Zoster, Live 06/18/2012, 06/14/2019, 01/22/2020    Family History  Problem Relation Age of Onset   Heart attack Mother    Cancer Father        liver     Current Facility-Administered Medications:    (feeding supplement) PROSource Plus liquid 30 mL, 30 mL, Oral, BID BM, Adhikari, Amrit, MD, 30 mL at 08/21/21 2055   0.9 %  sodium chloride infusion, , Intravenous, PRN, Shelly Coss, MD, Last Rate: 10 mL/hr at 08/22/21 1021, New Bag at 08/22/21 1021   albuterol (VENTOLIN HFA) 108 (90 Base) MCG/ACT inhaler 2 puff, 2 puff, Inhalation, Q2H PRN, Fredia Sorrow, MD, 2 puff at 08/22/21 4665   amiodarone (PACERONE) tablet 200 mg, 200 mg, Oral, Daily, Tu, Ching T, DO, 200 mg at 08/22/21 0933   azithromycin (ZITHROMAX) 500 mg in sodium chloride 0.9 % 250 mL IVPB, 500 mg, Intravenous, Q24H, Tu, Ching T, DO, Last Rate: 250 mL/hr at 08/21/21 2045, 500 mg at 08/21/21 2045   benzonatate (TESSALON) capsule 200 mg,  200 mg, Oral, TID PRN, Tu, Ching T, DO   cefTRIAXone (ROCEPHIN) 2 g in sodium chloride 0.9 % 100 mL IVPB, 2 g, Intravenous, Q24H, Tu, Ching T, DO, Last Rate: 200 mL/hr at 08/21/21 2320, 2 g at 08/21/21 2320   Chlorhexidine Gluconate Cloth 2 % PADS 6 each, 6 each, Topical, Daily, Kathryne Eriksson, NP, 6 each at 08/22/21 0933   feeding supplement (ENSURE ENLIVE / ENSURE PLUS) liquid 237 mL, 237 mL, Oral, BID BM, Adhikari, Amrit, MD, 237 mL at 08/22/21 1025    furosemide (LASIX) injection 80 mg, 80 mg, Intravenous, Q12H, Adhikari, Amrit, MD, 80 mg at 08/22/21 0933   ipratropium-albuterol (DUONEB) 0.5-2.5 (3) MG/3ML nebulizer solution 3 mL, 3 mL, Nebulization, Q6H PRN, Tawanna Solo, Amrit, MD, 3 mL at 08/22/21 0831   levothyroxine (SYNTHROID) tablet 25 mcg, 25 mcg, Oral, Q0600, Tu, Ching T, DO, 25 mcg at 08/22/21 0600   MEDLINE mouth rinse, 15 mL, Mouth Rinse, BID, Adhikari, Amrit, MD, 15 mL at 08/22/21 1022   metoprolol succinate (TOPROL-XL) 24 hr tablet 12.5 mg, 12.5 mg, Oral, Daily, Tu, Ching T, DO, 12.5 mg at 08/22/21 0932   mometasone-formoterol (DULERA) 100-5 MCG/ACT inhaler 2 puff, 2 puff, Inhalation, BID, Tu, Ching T, DO, 2 puff at 08/22/21 0813   multivitamin with minerals tablet 1 tablet, 1 tablet, Oral, Daily, Adhikari, Amrit, MD, 1 tablet at 08/22/21 0932   rivaroxaban (XARELTO) tablet 20 mg, 20 mg, Oral, Q supper, Arlyn Dunning M, RPH, 20 mg at 08/21/21 1647   rosuvastatin (CRESTOR) tablet 10 mg, 10 mg, Oral, Daily, Tu, Ching T, DO, 10 mg at 08/22/21 0933   tamsulosin (FLOMAX) capsule 0.4 mg, 0.4 mg, Oral, Daily, Tu, Ching T, DO, 0.4 mg at 08/22/21 0932   Tbo-Filgrastim (GRANIX) injection 480 mcg, 480 mcg, Subcutaneous, q1800, Adhikari, Amrit, MD, 480 mcg at 08/21/21 1653   umeclidinium bromide (INCRUSE ELLIPTA) 62.5 MCG/ACT 1 puff, 1 puff, Inhalation, Daily, Tu, Ching T, DO, 1 puff at 08/22/21 [facility] Events:  07/25/2021 - admit   Interim History / Subjective:   08/22/2021 - seen in [place] ICU bed 1228  Objective   Blood pressure 136/67, pulse 80, temperature (!) 96.9 F (36.1 C), temperature source Oral, resp. rate (!) 36, height _0  (1.93 m), weight 121.5 kg, SpO2 96 %.        Intake/Output Summary (Last 24 hours) at 08/22/2021 1149 Last data filed at 08/22/2021 0505 Gross per 24 hour  Intake 1605 ml  Output 1345 ml  Net 260 ml   Filed Weights   08/19/21 1526 08/22/21 1005  Weight: 123.4 kg 121.5 kg     Examination: General: Morbidly obese male lying on the bed.  Comfortable on 15 L nasal cannula HENT: Obese neck.  3 L nasal cannula Lungs: Distant air entry but no obvious respiratory distress Cardiovascular: Sinus rhythm normal heart sounds Abdomen: Obese and soft Extremities: Intact no deformities.  Mild edema Neuro: Alert and oriented x3 moves all 4 extremities GU: x  Resolved Hospital Problem list     Assessment & Plan:   A:  Previous heavy smoker with hx of copd nos - Prior to & Present on Admit   At risk for sleep apnea though denies formal diagnosis - Prior to & Present on Admit  Chronic interstitial lung disease changes - Prior to & Present on Admit  -Seen as early as December 2021.  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  Amiodarone], and post COVID  Right lower lobe lung mass -onset May 2022; status post CT-guided thoracic biopsy 03/09/2019 -biopsy features consistent with B-cell lymphoma   New onset right pleural effusion -at admission 07/29/2021 [too small to tap 08/19/2021  Recent worsening in respiratory failure with acute on chronic hypoxic respiratory failure         -Present on admission requiring 4 L nasal cannula 08/10/2021  -Findings suggest right lower lobe density onset in May 2022 [new since December 2021] but is progressively worse.  -Possible ILD findings are slightly worse  -Differential diagnosis here could be prior chemo amiodarone prior COVID.  -Doubt [urine strep and Legionella negative] CAP   08/22/2021 -> 15 L nasal cannula.  Concern here is actually for worsening lymphoma in the right lower lobe and related effusion.  Doubt PJP.  No evidence of CAP or HCAP  P:   Continue BD Start BiPAP [okay for [employer]] daytime 2 applications at night nightly Get high-resolution CT chest to define interstitial abnormalities If pleural effusion is worse consider thoracentesis to relieve  hypoxemia Check pan respiratory virus panel Check procalcitonin Check serology, ESR, CRP, ANA, ANCA, GBM Might need IV steroids - high dose Might need to hold off amiodarone  Bronchoscopy with alveolar lavage (lymphocytes would indicate lymphoma]-could be helpful but will require intubation and could be considered if his respiratory failure gets worse and he gets intubated.  Too high risk to do currently and would require elective intubation   MSK/DERM Failure to thrive with significant weight loss  Plan - According to the hospitalist.    Best practice (daily eval):  According to the hospitalist   Goals of Care:   According to the hospitalist    Family Updates: Son and grandson updated at the bedside     [place]   The patient JAIMES ECKERT is critically ill with multiple organ systems failure and requires high complexity decision making for assessment and support, frequent evaluation and titration of therapies, application of advanced monitoring technologies and extensive interpretation of multiple databases.   Critical Care Time devoted to patient care services described in this note is  60  Minutes. This time reflects time of care of this signee Dr Brand Males. This critical care time does not reflect procedure time, or teaching time or supervisory time of PA/NP/Med student/Med Resident etc but could involve care discussion time      SIGNATURE    Dr. Brand Males, M.D., F.C.C.P,  Pulmonary and Critical Care Medicine Staff Physician, [place] [facility] Director - Interstitial Lung Disease  Program  Pulmonary [place] at [place], [place], [address]  NPI Number:  NPI #[id number]  Pager: [phone], If no answer  -> Check AMION or Try [phone] Telephone (clinical office): [phone] Telephone (research): [phone]  11:49 AM 08/22/2021   08/22/2021 11:49 AM    LABS     PULMONARY Recent Labs  Lab 08/22/21 0840  PHART 7.425  PCO2ART 31.7*  PO2ART 89.7  HCO3 20.4  O2SAT 96.9    CBC Recent Labs  Lab 08/20/21 0403 08/21/21 0432 08/22/21 0409  HGB 10.3* 10.2* 10.4*  HCT 32.3* 31.8* 32.7*  WBC 1.2* 0.9* 1.8*  PLT 124* 115* 107*    COAGULATION No results for input(s): INR in the last 168 hours.  CARDIAC  No results for input(s): TROPONINI in the last 168 hours. No results for  input(s): PROBNP in the last 168 hours.   CHEMISTRY Recent Labs  Lab 08/11/2021 1551 08/20/21 0403 08/21/21 0432 08/22/21 0409  NA 134* 135 132* 132*  K 4.4 3.5 3.5 3.5  CL 104 104 101 99  CO2 19* _0 GLUCOSE 190* 109* 139* 220*  BUN 37* 35* 41* 45*  CREATININE 2.13* 1.98* 2.09* 2.05*  CALCIUM 8.4* 7.9* 7.7* 7.7*   Estimated Creatinine Clearance: 41.6 mL/min (A) (by C-G formula based on SCr of 2.05 mg/dL (H)).   LIVER Recent Labs  Lab 08/13/2021 1551  AST 33  ALT 15  ALKPHOS 62  BILITOT 0.8  PROT 6.3*  ALBUMIN 2.7*     INFECTIOUS Recent Labs  Lab 08/17/2021 2206  LATICACIDVEN 1.3     ENDOCRINE CBG (last 3)  No results for input(s): GLUCAP in the last 72 hours.       IMAGING x48h  - image(s) personally visualized  -   highlighted in bold DG CHEST PORT 1 VIEW  Result Date: 08/22/2021 CLINICAL DATA:  80 year old male with increase shortness of breath. 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 Small pleural effusion not feasible for thoracentesis on 08/19/2021. EXAM: PORTABLE CHEST 1 VIEW COMPARISON:  Chest CT 08/07/2021 and earlier. FINDINGS: Portable AP semi upright view at 0843 hours. Stable right chest Port-A-Cath, power port which is currently accessed. Mildly lower lung volumes. Progressive failing and confluent opacity in the right lower lung and about the hilum. Stable visible mediastinal contours. Left lung appears stable. No pneumothorax or pulmonary edema. IMPRESSION: Progressive right airspace disease and/or pleural effusion  since the CT on 07/30/2021. Repeat Chest Ultrasound for feasibility of thoracentesis now might be valuable. Lower lung volumes, left lung remains clear. Electronically Signed   By: Genevie Ann M.D.   On: 08/22/2021 08:57   ECHOCARDIOGRAM LIMITED  Result Date: 08/21/2021    ECHOCARDIOGRAM LIMITED REPORT   Patient Name:   WILLIAMS DIETRICK Date of Exam: 08/21/2021 Medical Rec #:  [id number]   Height:       76.0 in Accession #:    [id number]  Weight:       272.0 lb Date of Birth:  [birth year]   BSA:          2.524 m Patient Age:    86 years    BP:           127/64 mmHg Patient Gender: M           HR:           83 bpm. Exam Location:  Inpatient Procedure: Limited Echo, Limited Color Doppler and Cardiac Doppler Indications:    CHF-Acute Diastolic D78.24  History:        Patient has prior history of Echocardiogram examinations, most                 recent 06/07/2021. Cardiomyopathy; Risk Factors:Hypertension and                 Diabetes.  Sonographer:    Bernadene Person RDCS Referring Phys: [id number] AMRIT ADHIKARI IMPRESSIONS  1. Poor acoustic windows limt study LVEF may be mildly depressed REcomm limited echo with Defiinity to confirm wall motion Images suggestive that inferior/inferoseptal walls are hypokinetic . There is moderate left ventricular hypertrophy.  2. Right ventricular systolic function is low normal. The right ventricular size is mildly enlarged. There is normal pulmonary artery systolic pressure.  3. MR is eccentric, directed posterior into LA. Probable moderate . Moderate mitral valve regurgitation.  4. The aortic valve is tricuspid. Aortic valve regurgitation is not visualized. Aortic valve sclerosis/calcification is present, without any evidence of aortic stenosis.  5. Aortic dilatation noted. There is mild dilatation of the aortic root, measuring 41 mm. There is mild dilatation of the ascending aorta, measuring 38 mm.  6. The inferior vena cava is normal in size with greater than 50% respiratory variability,  suggesting right atrial pressure of 3 mmHg. FINDINGS  Left Ventricle: Poor acoustic windows limt study LVEF may be mildly depressed REcomm limited echo with Defiinity to confirm wall motion Images suggestive that inferior/inferoseptal walls are hypokinetic. The left ventricular internal cavity size was normal in size. There is moderate left ventricular hypertrophy. Right Ventricle: The right ventricular size is mildly enlarged. Right ventricular systolic function is low normal. There is normal pulmonary artery systolic pressure. The tricuspid regurgitant velocity is 2.38 m/s, and with an assumed right atrial pressure of 3 mmHg, the estimated right ventricular systolic pressure is 54.0 mmHg. Left Atrium: Left atrial size was normal in size. Right Atrium: Right atrial size was normal in size. Pericardium: There is no evidence of pericardial effusion. Mitral Valve: MR is eccentric, directed posterior into LA. Probable moderate. There is mild thickening of the mitral valve leaflet(s). Moderate mitral valve regurgitation. Tricuspid Valve: The tricuspid valve is grossly normal. Tricuspid valve regurgitation is mild. Aortic Valve: The aortic valve is tricuspid. Aortic valve regurgitation is not visualized. Aortic valve sclerosis/calcification is present, without any evidence of aortic stenosis. Pulmonic Valve: The pulmonic valve was not well visualized. Pulmonic valve regurgitation is not visualized. Aorta: Aortic dilatation noted. There is mild dilatation of the aortic root, measuring 41 mm. There is mild dilatation of the ascending aorta, measuring 38 mm. Venous: The inferior vena cava is normal in size with greater than 50% respiratory variability, suggesting right atrial pressure of 3 mmHg. IAS/Shunts: The interatrial septum was not assessed. LEFT VENTRICLE PLAX 2D LVIDd:         4.50 cm     Diastology LVIDs:         2.60 cm     LV e' medial:    3.38 cm/s LV PW:         1.50 cm     LV E/e' medial:  19.7 LV IVS:         1.30 cm     LV e' lateral:   6.08 cm/s LVOT diam:     2.10 cm     LV E/e' lateral: 10.9 LV SV:         62 LV SV Index:   25 LVOT Area:     3.46 cm  LV Volumes (MOD) LV vol d, MOD A2C: 96.0 ml LV vol d, MOD A4C: 81.0 ml LV vol s, MOD A2C: 51.6 ml LV vol s, MOD A4C: 41.9 ml LV SV MOD A2C:     44.4 ml LV SV MOD A4C:     81.0 ml LV SV MOD BP:      41.0 ml RIGHT VENTRICLE RV S prime:     13.50 cm/s TAPSE (M-mode): 2.5 cm LEFT ATRIUM           Index LA diam:      3.90 cm 1.54 cm/m LA Vol (A4C): 46.2 ml 18.30 ml/m  AORTIC VALVE LVOT Vmax:   103.00 cm/s LVOT Vmean:  69.400 cm/s LVOT VTI:    0.180 m  AORTA Ao Root diam: 4.10 cm Ao Asc diam:  3.80 cm MITRAL VALVE  TRICUSPID VALVE MV Area (PHT): 3.46 cm       TR Peak grad:   22.7 mmHg MV Decel Time: 219 msec       TR Vmax:        238.00 cm/s MR Peak grad:    118.8 mmHg MR Mean grad:    81.0 mmHg    SHUNTS MR Vmax:         545.00 cm/s  Systemic VTI:  0.18 m MR Vmean:        423.0 cm/s   Systemic Diam: 2.10 cm MR PISA:         1.57 cm MR PISA Eff ROA: 11 mm MR PISA Radius:  0.50 cm MV E velocity: 66.50 cm/s MV A velocity: 48.00 cm/s MV E/A ratio:  1.39 Dorris Carnes MD Electronically signed by Dorris Carnes MD Signature Date/Time: 08/21/2021/4:03:52 PM    Final

## 2021-08-22 NOTE — Progress Notes (Signed)
Pt sitting up in chair with c/o increased shortness of breath. On 15 l high flow Pt noted to have oxygen saturations 88-90%. Pt assisted back to bed and non re breather with 15 l high flow demonstrates oxygen saturations of  96%. Rapid Response and MD paged

## 2021-08-22 NOTE — Progress Notes (Signed)
OT Cancellation Note  Patient Details Name: Derek [name] MRN: [id number] DOB: Jan 11, 1941   Cancelled Treatment:    Reason Eval/Treat Not Completed: Medical issues which prohibited therapy. RN reports patient receiving rapid response. Patient also requiring increased oxygen needs. Will hold and follow as able and when patient hemodynamically stable.  Bernis Stecher L Javis Abboud 08/22/2021, 8:50 AM

## 2021-08-22 NOTE — Progress Notes (Signed)
PT Cancellation Note  Patient Details Name: JGUADALUPE OPIELA MRN: [id number] DOB: 03-16-1941   Cancelled Treatment:    Reason Eval/Treat Not Completed: Medical issues which prohibited therapy. Rapid response called this a.m. with plan for transfer to SDU.Will hold PT today and check back on tomorrow to attempt PT eval if medically ready. Thanks    Doreatha Massed, PT Acute Rehabilitation  Office: [phone] Pager: [phone]

## 2021-08-22 NOTE — Significant Event (Signed)
Rapid Response Event Note   Reason for Call :  Increased O2 needs  Initial Focused Assessment:  On arrival, patient in bed on 15L HFNC and 15L NRB. Not complaining of severe shortness of breath, noted to be tachypnic rate 27-30/minute. Denies chest pain or pressure. Per bedside RN, patient was up in chair and became increasingly short of breath and stating "I feel like im going to die". Per charting SpO2 dropped to 88% with activity.   Grip equal and moderately strong, follows commands and A&Ox4.  Cardiac- HR 82 NSR, BP 128/80, Pulses 2+ bilaterally. S1 and S2 auscultated, no adventitious heart sounds noted.  Respiratory-expiratory wheezes noted in bilaterally, PRN treatment administered see MAR.    Interventions:  NRB removed by RRRN and patient O2 maintaining 97% and above on 15 L HFNC Albuterol administered by bedside RN, patient states that it did help breathing get easier. PRN Duoneb administered also.   CXR and ABG ordered prior to arrival by Adhikari MD. Orders placed to transfer to SDU per Adhikari MD.   Plan of Care:  Transfer to SDU for closer O2 monitoring   Event Summary:   MD Notified: Tawanna Solo MD notified by bedside RN Call Time: 0805 Arrival Time: 0810 End Time:  Josph Macho, RN

## 2021-08-22 NOTE — Consult Note (Addendum)
Cardiology Consultation:   Patient ID: AMROM ORE MRN: [id number]; DOB: [birth year]  Admit date: 08/12/2021 Date of Consult: 08/22/2021  PCP:  Wenda Low, MD   [facility] Providers Cardiologist:  [name], MD        Patient Profile:   Derek [name] is a 80 y.o. male with a hx of  who is being seen 08/22/2021 for the evaluation of dyspnea at the request of Dr Tawanna Solo .  History of Present Illness:   Derek [name] is a 80 year old with a history of hypertension, CAD (prior cath 85 to 95% diagonal branch, occluded OM 2.  Plan for medical therapy), chronic kidney disease, type 2 diabetes, hyperlipidemia with intolerance to Lipitor, atrial flutter/fib.  The patient has had multiple previous cardioversions (2019, 2020, 2021, March 2022).  His CHA2DS2-VASc score is 5.  He he is maintained on amiodarone.  In Derek 2022 it was increased to 200 twice daily with plan for repeat cardioversion.  PET scan in September showed a hypermetabolic right upper lobe lung mass was decreasing in size patchy groundglass appearance in both lungs concern for drug reaction.  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  Treated with prednisone chronically.  Note he has remained COVID-positive since infection in February.  Patient has been off chemotherapy for 3 months due to persistent respiratory problems and possible infection.  Note 60 pound weight loss over 8 months due to decreased appetite.  The patient presented to [facility] on 11/30 with complaints of increaed SOB for 3 months  On admit, the patient was hypoxic.  Sats on room air 85%.  Lab work showed pancytopenia.  CT scan showed a moderate right pleural effusion diffuse right lung peripheral solid and groundglass nodular densities, question infectious versus neoplastic.  Started on empiric antibiotics.  IV Lasix also started due to shortness of breath and bilateral lower extremity edema.  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   GERD (gastroesophageal reflux disease)    occ tums   H/O cardiac catheterization    H/O exercise stress test 1999, 2002   Hypertension    Hypertriglyceridemia    Lymphocytosis    Obesity    Pulmonary Function Test 09/2018   PFTs 09/2018:  FEV1 88% predicted; FEV1/FVC 78%; DLCO cor 58    Past Surgical History:  Procedure Laterality Date   CARDIOVERSION N/A 09/05/2018   Procedure: CARDIOVERSION;  Surgeon: Jerline Pain, MD;  Location: [place];  Service: Cardiovascular;  Laterality: N/A;   CARDIOVERSION N/A 10/22/2018   Procedure: CARDIOVERSION;  Surgeon: Jerline Pain, MD;  Location: [place];  Service: Cardiovascular;  Laterality: N/A;   CARDIOVERSION N/A 08/28/2020   Procedure: CARDIOVERSION;  Surgeon: Josue [name], MD;  Location: [place];  Service: Cardiovascular;  Laterality: N/A;   CARDIOVERSION N/A 11/30/2020   Procedure: CARDIOVERSION;  Surgeon: Jerline Pain, MD;  Location: [facility] ENDOSCOPY;  Service: Cardiovascular;  Laterality: N/A;   CARDIOVERSION N/A 04/22/2021   Procedure: CARDIOVERSION;  Surgeon: Buford Dresser, MD;  Location: [place];  Service:  Cardiovascular;  Laterality: N/A;   CHOLECYSTECTOMY N/A 07/12/2016   Procedure: LAPAROSCOPIC CHOLECYSTECTOMY;  Surgeon: Donnie Mesa, MD;  Location: [place];  Service: General;  Laterality: N/A;   EXTRACORPOREAL SHOCK WAVE LITHOTRIPSY Right 11/18/2019   Procedure: EXTRACORPOREAL SHOCK WAVE LITHOTRIPSY (ESWL);  Surgeon: Lucas Mallow, MD;  Location: [facility];  Service: Urology;   Laterality: Right;   FOOT SURGERY Right    IR IMAGING GUIDED PORT INSERTION  03/19/2021   NASAL SINUS SURGERY     TEE WITHOUT CARDIOVERSION N/A 09/05/2018   Procedure: TRANSESOPHAGEAL ECHOCARDIOGRAM (TEE);  Surgeon: Jerline Pain, MD;  Location: [facility] ENDOSCOPY;  Service: Cardiovascular;  Laterality: N/A;   TEE WITHOUT CARDIOVERSION N/A 08/28/2020   Procedure: TRANSESOPHAGEAL ECHOCARDIOGRAM (TEE);  Surgeon: Josue [name], MD;  Location: [facility] ENDOSCOPY;  Service: Cardiovascular;  Laterality: N/A;   TONSILLECTOMY     UMBILICAL HERNIA REPAIR N/A 07/12/2016   Procedure: UMBILICAL HERINA REPAIR;  Surgeon: Donnie Mesa, MD;  Location: [place];  Service: General;  Laterality: N/A;   vericose vein stripping         Inpatient Medications: Scheduled Meds:  (feeding supplement) PROSource Plus  30 mL Oral BID BM   amiodarone  200 mg Oral Daily   Chlorhexidine Gluconate Cloth  6 each Topical Daily   feeding supplement  237 mL Oral BID BM   furosemide  80 mg Intravenous Q12H   levothyroxine  25 mcg Oral Q0600   metoprolol succinate  12.5 mg Oral Daily   mometasone-formoterol  2 puff Inhalation BID   multivitamin with minerals  1 tablet Oral Daily   rivaroxaban  20 mg Oral Q supper   rosuvastatin  10 mg Oral Daily   tamsulosin  0.4 mg Oral Daily   Tbo-filgastrim (GRANIX) SQ  480 mcg Subcutaneous q1800   umeclidinium bromide  1 puff Inhalation Daily   Continuous Infusions:  sodium chloride 10 mL/hr at 08/21/21 1030   azithromycin 500 mg (08/21/21 2045)   cefTRIAXone (ROCEPHIN)  IV 2 g (08/21/21 2320)   PRN Meds: sodium chloride, albuterol, benzonatate, ipratropium-albuterol  Allergies:    Allergies  Allergen Reactions   Lipitor [Atorvastatin] Rash   Metformin Diarrhea    Social History:   Social History   Socioeconomic History   Marital status: Widowed    Spouse name: Not on file   Number of children: 3   Years of education: Not on file   Highest education level: Not on file   Occupational History    Employer: [employer]    Comment: retied Audiological scientist; asphalt work   Tobacco Use   Smoking status: Former    Packs/day: 3.00    Years: 10.00    Pack years: 30.00    Types: Cigarettes    Quit date: 09/19/1988    Years since quitting: 32.9   Smokeless tobacco: Never  Vaping Use   Vaping Use: Never used  Substance and Sexual Activity   Alcohol use: Yes    Alcohol/week: 17.0 - 19.0 standard drinks    Types: 14 Shots of liquor, 3 - 5 Standard drinks or equivalent per week   Drug use: No   Sexual activity: Not on file  Other Topics Concern   Not on file  Social History Narrative   Not on file   Social Determinants of Health   Financial Resource Strain: Not on file  Food Insecurity: Not on file  Transportation Needs: Not on file  Physical Activity: Not on file  Stress:  Not on file  Social Connections: Not on file  Intimate Partner Violence: Not on file    Family History:    Family History  Problem Relation Age of Onset   Heart attack Mother    Cancer Father        liver     ROS:  Please see the history of present illness.   All other ROS reviewed and negative.     Physical Exam/Data:   Vitals:   08/21/21 2200 08/21/21 2300 08/22/21 0000 08/22/21 0503  BP:    115/71  Pulse: 74 72 78 74  Resp: (!) 28 (!) 26 (!) 24 (!) 22  Temp: 98.1 F (36.7 C)   97.6 F (36.4 C)  TempSrc: Oral   Oral  SpO2: 95% 94% 94% 95%  Weight:      Height:        Intake/Output Summary (Last 24 hours) at 08/22/2021 0924 Last data filed at 08/22/2021 0505 Gross per 24 hour  Intake 1965 ml  Output 1545 ml  Net 420 ml   Net neg 1.5 L    Last 3 Weights 08/19/2021 07/21/2021 07/02/2021  Weight (lbs) 272 lb 277 lb 280 lb 6.4 oz  Weight (kg) 123.378 kg 125.646 kg 127.189 kg     Body mass index is 33.11 kg/m.  General:  Obese 80 yo on 100% NRB  Sats 88% HEENT: normal Neck: neck is full   Vascular: No carotid bruits; Distal pulses 2+ bilaterally Cardiac:  normal S1,  S2; RRR; no murmur   Lungs: Moving air   Rhonchi bilaterally  Abd: soft Obese  Mild RUQ tenderness  Ext: Tr to 1+ LE edema Musculoskeletal:  No deformities, BUE and BLE strength normal and equal Skin: warm and dry  Neuro:  CNs 2-12 intact, no focal abnormalities noted Psych:  Normal affect   EKG:  The EKG was personally reviewed and demonstrates:  11/30  SR   First degree AV block  LAFB Telemetry:  Telemetry was personally reviewed and demonstrates:   SR   Relevant CV Studies: Echo 08/21/21 Poor acoustic windows limt study LVEF may be mildly depressed REcomm limited echo with Defiinity to confirm wall motion Images suggestive that inferior/inferoseptal walls are hypokinetic . There is moderate left ventricular hypertrophy. 1. Right ventricular systolic function is low normal. The right ventricular size is mildly enlarged. There is normal pulmonary artery systolic pressure. 2. MR is eccentric, directed posterior into LA. Probable moderate . Moderate mitral valve regurgitation. 3. The aortic valve is tricuspid. Aortic valve regurgitation is not visualized. Aortic valve sclerosis/calcification is present, without any evidence of aortic stenosis. 4. Aortic dilatation noted. There is mild dilatation of the aortic root, measuring 41 mm. There is mild dilatation of the ascending aorta, measuring 38 mm. 5. The inferior vena cava is normal in size with greater than 50% respiratory variability, suggesting right atrial pressure of 3 mmHg.  Echo 06/07/21 eft ventricular ejection fraction, by estimation, is 55 to 60%. The left ventricle has normal function. The left ventricle has no regional wall motion abnormalities. There is mild left ventricular hypertrophy. Left ventricular diastolic parameters are indeterminate. 1. 2. Right ventricular systolic function is normal. The right ventricular size is normal. 3. The mitral valve is normal in structure. Trivial mitral valve regurgitation. The  aortic valve is normal in structure. Aortic valve regurgitation is not visualized. No aortic stenosis is present.  Laboratory Data:  High Sensitivity Troponin:  No results for input(s): TROPONINIHS in the last 720  hours.   Chemistry Recent Labs  Lab 08/20/21 0403 08/21/21 0432 08/22/21 0409  NA 135 132* 132*  K 3.5 3.5 3.5  CL 104 101 99  CO2 22 24 23   GLUCOSE 109* 139* 220*  BUN 35* 41* 45*  CREATININE 1.98* 2.09* 2.05*  CALCIUM 7.9* 7.7* 7.7*  GFRNONAA 34* 32* 32*  ANIONGAP 9 7 10     Recent Labs  Lab 08/07/2021 1551  PROT 6.3*  ALBUMIN 2.7*  AST 33  ALT 15  ALKPHOS 62  BILITOT 0.8   Lipids No results for input(s): CHOL, TRIG, HDL, LABVLDL, LDLCALC, CHOLHDL in the last 168 hours.  Hematology Recent Labs  Lab 08/20/21 0403 08/21/21 0432 08/22/21 0409  WBC 1.2* 0.9* 1.8*  RBC 3.79* 3.72* 3.85*  HGB 10.3* 10.2* 10.4*  HCT 32.3* 31.8* 32.7*  MCV 85.2 85.5 84.9  MCH 27.2 27.4 27.0  MCHC 31.9 32.1 31.8  RDW 16.5* 16.5* 16.5*  PLT 124* 115* 107*   Thyroid No results for input(s): TSH, FREET4 in the last 168 hours.  BNP Recent Labs  Lab 08/19/21 1300  BNP 221.4*    DDimer No results for input(s): DDIMER in the last 168 hours.   Radiology/Studies:  DG Chest 2 View  Result Date: 08/10/2021 CLINICAL DATA:  Shortness of breath over the last 3 months with productive cough and dyspnea with exertion. Non-Hodgkin's lymphoma. EXAM: CHEST - 2 VIEW COMPARISON:  CT chest 08/04/2021 and chest radiograph 08/17/2021 FINDINGS: Continued substantial consolidation in the right lower lobe, potentially from pneumonia or pulmonary lymphoma. Mild indistinct airspace opacity in the left lower lobe as well, with slight obscuration of left hemidiaphragm. Power injectable right Port-A-Cath tip: SVC. Atherosclerotic calcification of the aortic arch. Prominence of mediastinal and pericardial adipose tissues as on recent CT. Trace fluid in the right lower major fissure. A component of  pleural effusion on the right is not excluded. IMPRESSION: 1. Continued consolidation in the right lower lobe. The appearance could be from pneumonia or pulmonary involvement by lymphoma. 2. Mild left lower lobe airspace opacity, nonspecific, pneumonia not excluded. 3. At least a trace right pleural effusion with fluid along the lower portion of the major fissure. 4.  Aortic Atherosclerosis (ICD10-I70.0). Electronically Signed   By: Van Clines M.D.   On: 08/09/2021 14:33   CT Chest Wo Contrast  Result Date: 08/10/2021 CLINICAL DATA:  Pneumonia.  Cough. EXAM: CT CHEST WITHOUT CONTRAST TECHNIQUE: Multidetector CT imaging of the chest was performed following the standard protocol without IV contrast. COMPARISON:  PET-CT 06/11/2021. FINDINGS: Cardiovascular: Heart and aorta are normal in size. There is no pericardial effusion. There are atherosclerotic calcifications of the aorta and coronary arteries. Right chest port catheter tip ends in the distal SVC. Superior vena cava is small in size, similar to the prior exam. Mediastinum/Nodes: The visualized thyroid gland is within normal limits. There is an enlarged subcarinal lymph node measuring 13 mm short axis similar to the prior study. Additional nonenlarged mediastinal lymph nodes are unchanged. Difficult to assess for right hilar adenopathy secondary to lack of contrast. Esophagus is unremarkable. Lungs/Pleura: There is a new moderate-sized right pleural effusion. There is some peripheral ill-defined ground-glass and solid nodular densities throughout the right lung measuring up to 8 mm. There is compressive atelectasis and small amount of airspace disease in the right lower lobe containing air bronchograms. There is some ground-glass and interstitial opacities in the left lower lobe is well. No pneumothorax. Trachea and central airways are patent. Upper Abdomen: Gallbladder  surgically absent. Limited evaluation of the upper abdomen is otherwise within  normal limits. Musculoskeletal: Multilevel degenerative changes affect the spine. No acute fractures are seen. IMPRESSION: 1. New moderate-sized right pleural effusion. 2. Right lower lobe atelectasis and airspace disease worrisome for infection. 3. New diffuse right lung peripheral solid and ground-glass nodular densities, indeterminate. Findings may be infectious/inflammatory or neoplastic. 4. Ground-glass and interstitial opacities in the left lung base favors as infection/inflammation. 5. Stable enlarged subcarinal lymph node. Electronically Signed   By: Ronney Asters M.D.   On: 07/26/2021 18:43   [place] CHEST (PLEURAL EFFUSION)  Result Date: 08/19/2021 CLINICAL DATA:  Evaluate for thoracentesis. EXAM: CHEST ULTRASOUND COMPARISON:  Chest CT 08/16/2021 FINDINGS: Small amount of right pleural fluid but there is not a good percutaneous window. IMPRESSION: Small right pleural effusion. Thoracentesis not performed due to small volume and lack of safe percutaneous window. Electronically Signed   By: Markus Daft M.D.   On: 08/19/2021 13:40   DG CHEST PORT 1 VIEW  Result Date: 08/22/2021 CLINICAL DATA:  80 year old male with increase shortness of breath. 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 Small pleural effusion not feasible for thoracentesis on 08/19/2021. EXAM: PORTABLE CHEST 1 VIEW COMPARISON:  Chest CT 08/14/2021 and earlier. FINDINGS: Portable AP semi upright view at 0843 hours. Stable right chest Port-A-Cath, power port which is currently accessed. Mildly lower lung volumes. Progressive failing and confluent opacity in the right lower lung and about the hilum. Stable visible mediastinal contours. Left lung appears stable. No pneumothorax or pulmonary edema. IMPRESSION: Progressive right airspace disease and/or pleural effusion since the CT on 08/07/2021. Repeat Chest Ultrasound for feasibility of thoracentesis now might be valuable. Lower lung volumes, left lung remains clear. Electronically Signed   By: Genevie Ann M.D.   On: 08/22/2021 08:57   VAS [place] LOWER EXTREMITY VENOUS (DVT) (7a-7p)  Result Date: 08/19/2021  Lower Venous DVT Study Patient Name:  Derek [name]  Date of Exam:   07/22/2021 Medical Rec #: [id number]    Accession #:    [id number] Date of Birth: 1941-09-08    Patient Gender: M Patient Age:   50 years Exam Location:  [facility] Procedure:      VAS [place] LOWER EXTREMITY VENOUS (DVT) Referring Phys: Fredia Sorrow --------------------------------------------------------------------------------  Indications: Edema, and Palpable Cord.  Comparison Study: No prior study Performing Technologist: Maudry Mayhew MHA, RDMS, RVT, RDCS  Examination Guidelines: A complete evaluation includes B-mode imaging, spectral Doppler, color Doppler, and power Doppler as needed of all accessible portions of each vessel. Bilateral testing is considered an integral part of a complete examination. Limited examinations for reoccurring indications may be performed as noted. The reflux portion of the exam is performed with the patient in reverse Trendelenburg.  +-----+---------------+---------+-----------+----------+--------------+ RIGHTCompressibilityPhasicitySpontaneityPropertiesThrombus Aging +-----+---------------+---------+-----------+----------+--------------+ CFV  Full           Yes      Yes                                 +-----+---------------+---------+-----------+----------+--------------+   +---------+---------------+---------+-----------+----------+--------------+ LEFT     CompressibilityPhasicitySpontaneityPropertiesThrombus Aging +---------+---------------+---------+-----------+----------+--------------+ CFV      Full           Yes      Yes                                 +---------+---------------+---------+-----------+----------+--------------+ SFJ  Full                                                         +---------+---------------+---------+-----------+----------+--------------+ FV Prox  Full                                                        +---------+---------------+---------+-----------+----------+--------------+ FV Mid   Full                                                        +---------+---------------+---------+-----------+----------+--------------+ FV DistalFull                                                        +---------+---------------+---------+-----------+----------+--------------+ PFV      Full                                                        +---------+---------------+---------+-----------+----------+--------------+ POP      Full           Yes      Yes                                 +---------+---------------+---------+-----------+----------+--------------+ PTV      Full                                                        +---------+---------------+---------+-----------+----------+--------------+ PERO     Full                                                        +---------+---------------+---------+-----------+----------+--------------+ Gastroc  Full                                                        +---------+---------------+---------+-----------+----------+--------------+     Summary: RIGHT: - No evidence of common femoral vein obstruction.  LEFT: - There is no evidence of deep vein thrombosis in the lower extremity.  - No cystic structure found in the popliteal fossa.  *See table(s) above for measurements and observations. Electronically signed by Monica Martinez MD on 08/19/2021 at 5:23:22 PM.  Final    ECHOCARDIOGRAM LIMITED  Result Date: 08/21/2021    ECHOCARDIOGRAM LIMITED REPORT   Patient Name:   Derek [name] Date of Exam: 08/21/2021 Medical Rec #:  [id number]   Height:       76.0 in Accession #:    [id number]  Weight:       272.0 lb Date of Birth:  11/16/1940   BSA:          2.524 m Patient Age:    44  years    BP:           127/64 mmHg Patient Gender: M           HR:           83 bpm. Exam Location:  Inpatient Procedure: Limited Echo, Limited Color Doppler and Cardiac Doppler Indications:    CHF-Acute Diastolic K48.18  History:        Patient has prior history of Echocardiogram examinations, most                 recent 06/07/2021. Cardiomyopathy; Risk Factors:Hypertension and                 Diabetes.  Sonographer:    Bernadene Person RDCS Referring Phys: [id number] AMRIT ADHIKARI IMPRESSIONS  1. Poor acoustic windows limt study LVEF may be mildly depressed REcomm limited echo with Defiinity to confirm wall motion Images suggestive that inferior/inferoseptal walls are hypokinetic . There is moderate left ventricular hypertrophy.  2. Right ventricular systolic function is low normal. The right ventricular size is mildly enlarged. There is normal pulmonary artery systolic pressure.  3. MR is eccentric, directed posterior into LA. Probable moderate . Moderate mitral valve regurgitation.  4. The aortic valve is tricuspid. Aortic valve regurgitation is not visualized. Aortic valve sclerosis/calcification is present, without any evidence of aortic stenosis.  5. Aortic dilatation noted. There is mild dilatation of the aortic root, measuring 41 mm. There is mild dilatation of the ascending aorta, measuring 38 mm.  6. The inferior vena cava is normal in size with greater than 50% respiratory variability, suggesting right atrial pressure of 3 mmHg. FINDINGS  Left Ventricle: Poor acoustic windows limt study LVEF may be mildly depressed REcomm limited echo with Defiinity to confirm wall motion Images suggestive that inferior/inferoseptal walls are hypokinetic. The left ventricular internal cavity size was normal in size. There is moderate left ventricular hypertrophy. Right Ventricle: The right ventricular size is mildly enlarged. Right ventricular systolic function is low normal. There is normal pulmonary artery systolic  pressure. The tricuspid regurgitant velocity is 2.38 m/s, and with an assumed right atrial pressure of 3 mmHg, the estimated right ventricular systolic pressure is 02.6 mmHg. Left Atrium: Left atrial size was normal in size. Right Atrium: Right atrial size was normal in size. Pericardium: There is no evidence of pericardial effusion. Mitral Valve: MR is eccentric, directed posterior into LA. Probable moderate. There is mild thickening of the mitral valve leaflet(s). Moderate mitral valve regurgitation. Tricuspid Valve: The tricuspid valve is grossly normal. Tricuspid valve regurgitation is mild. Aortic Valve: The aortic valve is tricuspid. Aortic valve regurgitation is not visualized. Aortic valve sclerosis/calcification is present, without any evidence of aortic stenosis. Pulmonic Valve: The pulmonic valve was not well visualized. Pulmonic valve regurgitation is not visualized. Aorta: Aortic dilatation noted. There is mild dilatation of the aortic root, measuring 41 mm. There is mild dilatation of the ascending aorta, measuring 38 mm. Venous: The inferior vena cava is normal in  size with greater than 50% respiratory variability, suggesting right atrial pressure of 3 mmHg. IAS/Shunts: The interatrial septum was not assessed. LEFT VENTRICLE PLAX 2D LVIDd:         4.50 cm     Diastology LVIDs:         2.60 cm     LV e' medial:    3.38 cm/s LV PW:         1.50 cm     LV E/e' medial:  19.7 LV IVS:        1.30 cm     LV e' lateral:   6.08 cm/s LVOT diam:     2.10 cm     LV E/e' lateral: 10.9 LV SV:         62 LV SV Index:   25 LVOT Area:     3.46 cm  LV Volumes (MOD) LV vol d, MOD A2C: 96.0 ml LV vol d, MOD A4C: 81.0 ml LV vol s, MOD A2C: 51.6 ml LV vol s, MOD A4C: 41.9 ml LV SV MOD A2C:     44.4 ml LV SV MOD A4C:     81.0 ml LV SV MOD BP:      41.0 ml RIGHT VENTRICLE RV S prime:     13.50 cm/s TAPSE (M-mode): 2.5 cm LEFT ATRIUM           Index LA diam:      3.90 cm 1.54 cm/m LA Vol (A4C): 46.2 ml 18.30 ml/m  AORTIC  VALVE LVOT Vmax:   103.00 cm/s LVOT Vmean:  69.400 cm/s LVOT VTI:    0.180 m  AORTA Ao Root diam: 4.10 cm Ao Asc diam:  3.80 cm MITRAL VALVE                  TRICUSPID VALVE MV Area (PHT): 3.46 cm       TR Peak grad:   22.7 mmHg MV Decel Time: 219 msec       TR Vmax:        238.00 cm/s MR Peak grad:    118.8 mmHg MR Mean grad:    81.0 mmHg    SHUNTS MR Vmax:         545.00 cm/s  Systemic VTI:  0.18 m MR Vmean:        423.0 cm/s   Systemic Diam: 2.10 cm MR PISA:         1.57 cm MR PISA Eff ROA: 11 mm MR PISA Radius:  0.50 cm MV E velocity: 66.50 cm/s MV A velocity: 48.00 cm/s MV E/A ratio:  1.39 Dorris Carnes MD Electronically signed by Dorris Carnes MD Signature Date/Time: 08/21/2021/4:03:52 PM    Final      Assessment and Plan:   Volume overload   Patinet presented with SOB/respiratory failure  He has diuresed since admit but is still hypoxic I read echo yestreday   Very difficult study   I recomm limited with Definity to define wall motoin, LVEF  I would keep on diuresis and follow    2  Atrial fibrillation   Pt with history of recurrent afib.  He has been maintained on amiodarone and is in SR     3  Pulmonary  Pt with significant lung abnormalities  Hx of COPD.CT with significant abnormalities   NO signif fluid for thoracentesis  He has diuresed some but is still hypoxic requiring NRB  Pulmonary has Aletha Halim follow patient  On IV ABX empirically  WIll reivew  use of amiodarone    Would like to keep on SR    4   CLL  Pt has been off of chemotherapy for the past few months due to problem 3    5  CAD  Cath in past showed severe dz to a diagonal, an OM2 occluded  Recomm for medical Rx   Pt denies CP   Repeat echo pending    He is not on a statin   6   COVID +  Pt hs been COVID positive since infectiion  7   Hx HTN   BP is controlled     ADDENDUM   Reviewed with pulmonary   (M Ramaswamy)   They would favor holding amiodarone given pulumonary issues    Keep on telemetry    Signed, Dorris Carnes, MD   08/22/2021 9:24 AM

## 2021-08-22 NOTE — Progress Notes (Signed)
  Request seen for another attempt at thoracentesis.  Small effusion seen on 12/1, but lung flap visible and no window for safe approach for thoracentesis.  CT Chest ordered for today.  Will hold off thoracentesis until after CT scan to better evaluate effusion/lungs.  Secure chat sent to Dr. Tawanna Solo and to patients nurse to make aware.  Keilana Morlock S Norma Montemurro PA-C 08/22/2021 1:03 PM

## 2021-08-22 NOTE — Plan of Care (Signed)
Discussed with patient plan of care for the evening, pain management and wearing cpap tonight with some teach displayed.  Problem: Education: Goal: Knowledge of General Education information will improve Description: Including pain rating scale, medication(s)/side effects and non-pharmacologic comfort measures Outcome: Progressing

## 2021-08-22 NOTE — Progress Notes (Signed)
A BIPAP machine was placed in patient room for bedtime use. Currently patient appears to be tolerating the 15 L [place] well at this time with no distress

## 2021-08-22 NOTE — Progress Notes (Addendum)
Pt O2sat dropped to 87% at 5L [place] after sitting on edge of bed. Pt recovered to 90% with a few drops to 88%. RN and RT placed pt on humidified [place] high-flow at 13L. Pt O2Sat increased to 94%. Pt audibly wheezing with visible dyspnea. Pt c/o of fatigue but not SOB.

## 2021-08-22 NOTE — Progress Notes (Signed)
Pt desaturates to 87% with activity - transferring bed to chair - at 13L HFNC. RN increased to 15L to pt recovery at 92%.

## 2021-08-23 ENCOUNTER — Telehealth: Payer: Self-pay | Admitting: Emergency Medicine

## 2021-08-23 ENCOUNTER — Inpatient Hospital Stay (HOSPITAL_COMMUNITY): Payer: Medicare Other

## 2021-08-23 DIAGNOSIS — E440 Moderate protein-calorie malnutrition: Secondary | ICD-10-CM | POA: Insufficient documentation

## 2021-08-23 DIAGNOSIS — D801 Nonfamilial hypogammaglobulinemia: Secondary | ICD-10-CM | POA: Diagnosis not present

## 2021-08-23 DIAGNOSIS — J9602 Acute respiratory failure with hypercapnia: Secondary | ICD-10-CM

## 2021-08-23 DIAGNOSIS — J9601 Acute respiratory failure with hypoxia: Secondary | ICD-10-CM | POA: Diagnosis not present

## 2021-08-23 DIAGNOSIS — N184 Chronic kidney disease, stage 4 (severe): Secondary | ICD-10-CM | POA: Diagnosis not present

## 2021-08-23 DIAGNOSIS — J189 Pneumonia, unspecified organism: Secondary | ICD-10-CM | POA: Diagnosis not present

## 2021-08-23 DIAGNOSIS — L8990 Pressure ulcer of unspecified site, unspecified stage: Secondary | ICD-10-CM | POA: Insufficient documentation

## 2021-08-23 LAB — CBC WITH DIFFERENTIAL/PLATELET
Abs Immature Granulocytes: 0.45 10*3/uL — ABNORMAL HIGH (ref 0.00–0.07)
Basophils Absolute: 0 10*3/uL (ref 0.0–0.1)
Basophils Relative: 0 %
Eosinophils Absolute: 0 10*3/uL (ref 0.0–0.5)
Eosinophils Relative: 0 %
HCT: 34 % — ABNORMAL LOW (ref 39.0–52.0)
Hemoglobin: 11 g/dL — ABNORMAL LOW (ref 13.0–17.0)
Immature Granulocytes: 12 %
Lymphocytes Relative: 15 %
Lymphs Abs: 0.5 10*3/uL — ABNORMAL LOW (ref 0.7–4.0)
MCH: 26.9 pg (ref 26.0–34.0)
MCHC: 32.4 g/dL (ref 30.0–36.0)
MCV: 83.1 fL (ref 80.0–100.0)
Monocytes Absolute: 0.4 10*3/uL (ref 0.1–1.0)
Monocytes Relative: 12 %
Neutro Abs: 2.2 10*3/uL (ref 1.7–7.7)
Neutrophils Relative %: 61 %
Platelets: 104 10*3/uL — ABNORMAL LOW (ref 150–400)
RBC: 4.09 MIL/uL — ABNORMAL LOW (ref 4.22–5.81)
RDW: 16.6 % — ABNORMAL HIGH (ref 11.5–15.5)
WBC: 3.7 10*3/uL — ABNORMAL LOW (ref 4.0–10.5)
nRBC: 0 % (ref 0.0–0.2)

## 2021-08-23 LAB — BODY FLUID CELL COUNT WITH DIFFERENTIAL
Lymphs, Fluid: 22 %
Monocyte-Macrophage-Serous Fluid: 72 % (ref 50–90)
Neutrophil Count, Fluid: 6 % (ref 0–25)
Total Nucleated Cell Count, Fluid: 15092 cu mm — ABNORMAL HIGH (ref 0–1000)

## 2021-08-23 LAB — PROTEIN, PLEURAL OR PERITONEAL FLUID: Total protein, fluid: 4 g/dL

## 2021-08-23 LAB — LACTATE DEHYDROGENASE, PLEURAL OR PERITONEAL FLUID: LD, Fluid: 2497 U/L — ABNORMAL HIGH (ref 3–23)

## 2021-08-23 LAB — BASIC METABOLIC PANEL
Anion gap: 11 (ref 5–15)
BUN: 55 mg/dL — ABNORMAL HIGH (ref 8–23)
CO2: 23 mmol/L (ref 22–32)
Calcium: 8.1 mg/dL — ABNORMAL LOW (ref 8.9–10.3)
Chloride: 98 mmol/L (ref 98–111)
Creatinine, Ser: 2.27 mg/dL — ABNORMAL HIGH (ref 0.61–1.24)
GFR, Estimated: 29 mL/min — ABNORMAL LOW (ref >60)
Glucose, Bld: 172 mg/dL — ABNORMAL HIGH (ref 70–99)
Potassium: 3.7 mmol/L (ref 3.5–5.1)
Sodium: 132 mmol/L — ABNORMAL LOW (ref 135–145)

## 2021-08-23 LAB — PROCALCITONIN: Procalcitonin: 0.33 ng/mL

## 2021-08-23 MED ORDER — IMMUNE GLOBULIN (HUMAN) 10 GM/100ML IV SOLN
400.0000 mg/kg | INTRAVENOUS | Status: AC
  Administered 2021-08-23: 50 g via INTRAVENOUS
  Filled 2021-08-23: qty 400

## 2021-08-23 MED ORDER — RIVAROXABAN 15 MG PO TABS
15.0000 mg | ORAL_TABLET | Freq: Every day | ORAL | Status: DC
  Administered 2021-08-23 – 2021-08-25 (×3): 15 mg via ORAL
  Filled 2021-08-23 (×3): qty 1

## 2021-08-23 MED ORDER — POLYETHYLENE GLYCOL 3350 17 G PO PACK
17.0000 g | PACK | Freq: Every day | ORAL | Status: DC
  Administered 2021-08-23 – 2021-08-25 (×3): 17 g via ORAL
  Filled 2021-08-23 (×4): qty 1

## 2021-08-23 MED ORDER — SENNA 8.6 MG PO TABS
1.0000 | ORAL_TABLET | Freq: Two times a day (BID) | ORAL | Status: DC
  Administered 2021-08-23 – 2021-08-26 (×7): 8.6 mg via ORAL
  Filled 2021-08-23 (×7): qty 1

## 2021-08-23 MED ORDER — METHYLPREDNISOLONE SODIUM SUCC 125 MG IJ SOLR
60.0000 mg | Freq: Two times a day (BID) | INTRAMUSCULAR | Status: DC
  Administered 2021-08-23 – 2021-08-26 (×6): 60 mg via INTRAVENOUS
  Filled 2021-08-23 (×5): qty 2

## 2021-08-23 MED ORDER — OXYCODONE HCL 5 MG PO TABS
5.0000 mg | ORAL_TABLET | Freq: Four times a day (QID) | ORAL | Status: DC | PRN
  Administered 2021-08-23 – 2021-08-26 (×3): 5 mg via ORAL
  Filled 2021-08-23 (×3): qty 1

## 2021-08-23 MED ORDER — LIDOCAINE HCL 1 % IJ SOLN
INTRAMUSCULAR | Status: AC
  Filled 2021-08-23: qty 20

## 2021-08-23 MED ORDER — ACETAMINOPHEN 500 MG PO TABS
500.0000 mg | ORAL_TABLET | Freq: Once | ORAL | Status: AC
  Administered 2021-08-23: 500 mg via ORAL
  Filled 2021-08-23: qty 1

## 2021-08-23 NOTE — Evaluation (Signed)
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  CT chest at admission shows new onset moderate right pleural effusion, positive for Covid.   Clinical Impression   Patient is a 80 year old male who was noted to have had a significant decline in the ability to participate in ADLs. Patient was living at home alone prior level. Currently patient is +2 for transfers with poor standing balance and O2 dropping to 80s with standing transfers on 15L/min HFNC.  Patient was noted to have nasal cannula shift out of R nostril often during session with it being placed back to appropriate position each time. Patient would continue to benefit from skilled OT services at this time while admitted and after d/c to address noted deficits in order to improve overall safety and independence in ADLs.       Recommendations for follow up therapy are one component of a multi-disciplinary discharge planning process, led by the attending physician.  Recommendations may be updated based on patient status, additional functional criteria and insurance authorization.   Follow Up Recommendations  Skilled nursing-short term rehab (<3 hours/day)    Assistance Recommended at Discharge    Functional Status Assessment  Patient has had a recent decline in their functional status and demonstrates the ability to make significant improvements in function in a reasonable and predictable amount of time.  Equipment Recommendations  None recommended by OT    Recommendations for Other Services        Precautions / Restrictions Precautions Precautions: Fall Precaution Comments: in 15 L HFNC Restrictions Weight Bearing Restrictions: No      Mobility Bed Mobility Overal bed mobility: Needs Assistance Bed Mobility: Supine to Sit     Supine to sit: Min assist     General bed mobility comments: patient needed min A to tansfer to edge of bed on opposite side of bed.    Transfers Overall transfer level: Needs assistance Equipment used: 2 person hand held assist Transfers: Sit to/from Stand;Bed to chair/wheelchair/BSC Sit to Stand: Mod assist;+2 physical assistance;+2 safety/equipment;From elevated surface     Step pivot transfers: +2 safety/equipment;+2 physical assistance;Mod assist     General transfer comment: Patient required 2 persons to power up to stand from bed anf then from recliner. Pivot steps with HHA to recliner.      Balance Overall balance assessment: Needs assistance Sitting-balance support: Bilateral upper extremity supported;Feet supported Sitting balance-Leahy Scale: Fair     Standing balance support: During functional activity;Bilateral upper extremity supported;Reliant on assistive device for balance Standing balance-Leahy Scale: Poor                             ADL either performed or assessed with clinical judgement   ADL Overall ADL's : Needs assistance/impaired Eating/Feeding: Set up;Sitting   Grooming: Wash/dry face;Sitting;Set up   Upper Body Bathing: Sitting;Minimal assistance   Lower Body Bathing: Sitting/lateral leans;Maximal assistance;Bed level   Upper Body Dressing : Sitting;Bed level;Minimal assistance   Lower Body Dressing: Bed level;Sitting/lateral leans;Maximal assistance   Toilet Transfer: +2 for physical assistance;+2 for safety/equipment;Moderate assistance Toilet Transfer Details (  indicate cue type and reason): patient required +2 attempts to transfer from edge fo bed to wheelchair. patient attempted  x2 Toileting- Water quality scientist and Hygiene: Sitting/lateral lean;Sit to/from stand;Maximal assistance               Vision Baseline Vision/History: 1 Wears glasses Ability to See in Adequate Light: 1 Impaired Patient Visual Report: No change from baseline Additional Comments: wears glasses for reading     Perception     Praxis      Pertinent Vitals/Pain Pain Assessment: Faces Faces Pain Scale: Hurts little more Pain Location: back Pain Descriptors / Indicators: Discomfort;Grimacing Pain Intervention(s): Monitored during session     Hand Dominance     Extremity/Trunk Assessment Upper Extremity Assessment Upper Extremity Assessment: Overall WFL for tasks assessed   Lower Extremity Assessment Lower Extremity Assessment: Defer to PT evaluation   Cervical / Trunk Assessment Cervical / Trunk Assessment: Normal   Communication Communication Communication: No difficulties   Cognition Arousal/Alertness: Awake/alert Behavior During Therapy: WFL for tasks assessed/performed Overall Cognitive Status: No family/caregiver present to determine baseline cognitive functioning                                 General Comments: patient was oriented to place, person and situation. patient was oriented to date during session     General Comments       Exercises     Shoulder Instructions      Home Living Family/patient expects to be discharged to:: Private residence Living Arrangements: Alone Available Help at Discharge: Family Type of Home: House Home Access: Stairs to enter Technical brewer of Steps: 5 Entrance Stairs-Rails: Can reach both Home Layout: One level     Bathroom Shower/Tub: Occupational psychologist: [place] - single point          Prior Functioning/Environment Prior Level of Function : Independent/Modified Independent               ADLs Comments: patient reported doing IADLs as  well. still driving        OT Problem List: Decreased activity tolerance;Impaired balance (sitting and/or standing);Decreased safety awareness;Cardiopulmonary status limiting activity;Decreased knowledge of precautions;Decreased knowledge of use of DME or AE      OT Treatment/Interventions: Self-care/ADL training;Therapeutic exercise;Neuromuscular education;Energy conservation;DME and/or AE instruction;Therapeutic activities;Balance training;Patient/family education    OT Goals(Current goals can be found in the care plan section) Acute Rehab OT Goals Patient Stated Goal: to get stronger OT Goal Formulation: With patient Time For Goal Achievement: 09/06/21 Potential to Achieve Goals: Good  OT Frequency: Min 2X/week   Barriers to D/C: Decreased caregiver support          Co-evaluation PT/OT/SLP Co-Evaluation/Treatment: Yes Reason for Co-Treatment: For patient/therapist safety;Complexity of the patient's impairments (multi-system involvement) PT goals addressed during session: Mobility/safety with mobility OT goals addressed during session: ADL's and self-care      AM-PAC OT "6 Clicks" Daily Activity     Outcome Measure Help from another person eating meals?: A Little Help from another person taking care of personal grooming?: A Little Help from another person toileting, which includes using toliet, bedpan, or urinal?: A Lot Help from another person bathing (including washing, rinsing, drying)?: A Lot Help from another person to put on and taking off regular upper body clothing?: A Lot Help from another person to put on and taking off regular lower body  clothing?: A Lot 6 Click Score: 14   End of Session Equipment Utilized During Treatment: Rolling walker (2 wheels);Gait belt Nurse Communication: Mobility status  Activity Tolerance: Patient limited by fatigue Patient left: in chair;with call bell/phone within reach;with chair alarm set  OT Visit Diagnosis: Unsteadiness on  feet (R26.81);Muscle weakness (generalized) (M62.81)                Time: 1610-9604 OT Time Calculation (min): 23 min Charges:  OT General Charges $OT Visit: 1 Visit OT Evaluation $OT Eval Low Complexity: 1 Low  Leota Sauers, MS Acute Rehabilitation Department Office# [phone] Pager# [phone]   Marcellina Millin 08/23/2021, 12:30 PM

## 2021-08-23 NOTE — TOC Initial Note (Signed)
Transition of Care [facility]) - Initial/Assessment Note   Patient Details  Name: Derek [name] MRN: [id number] Date of Birth: Nov 11, 1940  Transition of Care [facility]) CM/SW Contact:    Sherie Don, LCSW Phone Number: 08/23/2021, 2:06 PM  Clinical Narrative: Readmission checklist completed due to high readmission score. CSW spoke with patient's friend, Vivien Presto, to complete assessment as patient is on 15 HFNC. Per Ms. Meda Coffee, patient resides at home alone. Patient was independent with ADLs until about 1 week before being admitted to the hospital. Patient does not have any issues with taking or affording medications. Patient was transporting himself to medical appointments and was not using any DME at home other than a walk-in shower with a seat. Ms. Meda Coffee unable to finish assessment, so CSW called patient's step daughter-in-law, Isidoro Donning.  Per Mrs. Tamala Julian, she will notify the patient's biological sons, Darrel and Orpah Greek, that PT and OT are recommending SNF as they will need to discuss this with the patient. TOC awaiting call from patient's sons.  Expected Discharge Plan: Skilled Nursing Facility Barriers to Discharge: Continued Medical Work up  Patient Goals and CMS Choice Patient states their goals for this hospitalization and ongoing recovery are:: To get better CMS Medicare.gov Compare Post Acute Care list provided to:: Patient Choice offered to / list presented to : Patient, Adult Children  Expected Discharge Plan and Services Expected Discharge Plan: [place] In-house Referral: Clinical Social Work Post Acute Care Choice: [place] Living arrangements for the past 2 months: [place]            DME Arranged: N/A DME Agency: NA  Prior Living Arrangements/Services Living arrangements for the past 2 months: Single Family Home Lives with:: Self Patient language and need for interpreter reviewed:: Yes Do you feel safe going back to the place  where you live?: Yes      Need for Family Participation in Patient Care: Yes (Comment) (Patient is COVID+ and on 15 HFNC.) Care giver support system in place?: Yes (comment) Criminal Activity/Legal Involvement Pertinent to Current Situation/Hospitalization: No - Comment as needed  Activities of Daily Living Home Assistive Devices/Equipment: Dentures (specify type) (full upper plate) ADL Screening (condition at time of admission) Patient's cognitive ability adequate to safely complete daily activities?: Yes Is the patient deaf or have difficulty hearing?: Yes (SLIGHT) Does the patient have difficulty seeing, even when wearing glasses/contacts?: No Does the patient have difficulty concentrating, remembering, or making decisions?: No Patient able to express need for assistance with ADLs?: Yes Does the patient have difficulty dressing or bathing?: No Independently performs ADLs?: No Communication: Independent Dressing (OT): Independent Grooming: Independent Feeding: Independent Bathing: Needs assistance Is this a change from baseline?: Change from baseline, expected to last >3 days Toileting: Needs assistance Is this a change from baseline?: Change from baseline, expected to last >3days In/Out Bed: Needs assistance Is this a change from baseline?: Change from baseline, expected to last >3 days Walks in Home: Needs assistance Is this a change from baseline?: Change from baseline, expected to last >3 days Does the patient have difficulty walking or climbing stairs?: Yes Weakness of Legs: Both Weakness of Arms/Hands: Both  Emotional Assessment Appearance:: Appears stated age Orientation: : Oriented to Self, Oriented to Place, Oriented to  Time, Oriented to Situation Alcohol / Substance Use: Not Applicable Psych Involvement: No (comment)  Admission diagnosis:  Shortness of breath [R06.02] Acute respiratory acidosis (HCC) [J96.02] Pleural effusion [J90] Hypoxia [R09.02] Acute  respiratory failure with  hypoxia ([place]) [J96.01] Diffuse large B-cell lymphoma, unspecified body region ([place]) [C83.30] Community acquired pneumonia of right lower lobe of lung [J18.9] Patient Active Problem List   Diagnosis Date Noted   Malnutrition of moderate degree 08/23/2021   Pressure injury of skin 08/23/2021   Community acquired pneumonia 08/19/2021   Pleural effusion 08/19/2021   Leukopenia 08/19/2021   Neutropenia ([place]) 08/19/2021   Hypothyroidism 08/19/2021   Acute respiratory acidosis ([place]) 08/19/2021   Acute respiratory failure with hypoxia ([place]) 08/10/2021   COPD (chronic obstructive pulmonary disease) ([place]) 07/21/2021   Atrial flutter (HCC)    Cough in adult 04/19/2021   Abnormal CT of the chest 02/23/2021   Mass of lower lobe of left lung 02/08/2021   Pulmonary infiltrate on radiologic exam 02/08/2021   PAF (paroxysmal atrial fibrillation) ([place])    Nodule of right lung 09/01/2020   Persistent atrial fibrillation ([place]) 08/24/2020   Secondary hypercoagulable state ([place]) 08/24/2020   Non compliance w medication regimen 08/04/2020   Chronic kidney disease (CKD), 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[place]) 06/09/2020   Acute prerenal failure ([place]) 06/01/2020   Goals of care, counseling/discussion 05/14/2020   Lymphadenopathy of head and neck 03/09/2020   Pancytopenia, acquired ([place]) 03/09/2020   DOE (dyspnea on exertion) 11/09/2018   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   Atypical atrial flutter (HCC)    Elevated serum creatinine 03/06/2018   Basal cell carcinoma (BCC) of scalp 03/06/2018   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   Preventive measure 06/25/2015   Hyperlipidemia 06/02/2015   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  06/02/2015   Recent upper respiratory tract infection 11/18/2014   Leukopenia due to antineoplastic chemotherapy ([place]) 10/21/2014   Thrombocytopenia ([place]) 08/19/2014   Hypersensitivity reaction 06/25/2014   At high risk of tumor lysis  syndrome 06/23/2014   Anemia in chronic illness 03/14/2014   Weight loss, non-intentional 03/14/2014   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 [address] Phone: [phone] Fax: [phone]  Readmission Risk Interventions Readmission Risk Prevention Plan 08/23/2021  Transportation Screening Complete  Medication Review ([place]) Complete  HRI or Home Care Consult Complete  SW Recovery Care/Counseling Consult Complete  Palliative Care Screening Not Applicable  Skilled Nursing Facility Not Complete  SNF Comments Family to discuss SNF with patient  Some recent data might be hidden

## 2021-08-23 NOTE — Progress Notes (Addendum)
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  Treated with chemotherapy and Rituxan.    Had COVID in February versus August 2022 2022 and persistent PCR positivity since then.  60 pound weight loss in the last 8 months.  He also has worsening shortness of breath for the last 3 months.  During these 3 months he has been off his chemotherapy.  He is also on chronic prednisone since around October 2022 by hematology Dr. Simeon Craft such.      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  By May 2022 he developed a right lower lobe density.  He had CT-guided biopsy 03/08/2021 that shows atypical lymphoid infiltrate consistent with non-Hodgkin B-cell lymphoma.  In September 2022 PET scans do seem to be slightly getting better.  However, that is progressively worse mid November 2022 CT scan and also 08/03/2021 admit CT scan.  This fits in with his progressive hypoxemia.  The ILD changes itself appears to be somewhat worse but is hard to say..  The overwhelming complex here massive right lower lobe density -with air bronchograms that is getting worse.  [In September 2022 PET scan it seems to be completely getting better.]    Pulmonary consultation in the office 07/21/2021 Dr. Baltazar Apo was concerned about drug-induced lung injury versus long COVID residual infiltrates.  Plan was to continue prednisone for another month and then  taper to 10 mg for a month and then slowly taper off.  He then presented 07/29/2021 with fever of 103, hypoxemic 85% on room air corrected with 4 L nasal cannula, leukopenia 2.3 with a left shift and a hemoglobin of 5.3.  [Pancytopenia] CT chest at admission shows new onset moderate right oral effusion [too small for thoracentesis per ultrasound], right lower lobe atelectasis airspace disease worrisome for infection and new diffuse right lung peripheral solid and groundglass nodular densities along with groundglass and interstitial opacities in the left lung.  Back in May 2022 thoracic radiology consider some of these findings as "probable UIP".  His subcarinal lymph node itself is stable.  Is being treated with azithromycin and ceftriaxone.  Also IV Lasix for possible volume overload.  Despite this he is progressing requiring 15 L nasal cannula.  He was transferred to intensive care unit and pulmonary critical care medicine has been consulted.  Urine Streptococcus negative.  He had an echocardiogram for window: There is concern for slightly reduced ejection fraction probably related to chemotherapy.  Of note patient reported that his pedal edema has improved with Lasix although his hypoxemia is worse.  Denies any chest pain.    Past Medical History:    has a past medical history of Blood dyscrasia, CLL (chronic lymphocytic leukemia) ([place]) (09/10/2013), Diabetes mellitus, type II ([place]), Dilated cardiomyopathy ([place]) (09/14/2018), GERD (gastroesophageal reflux disease), H/O cardiac catheterization, H/O exercise stress test (1999, 2002), Hypertension, Hypertriglyceridemia, Lymphocytosis, Obesity, and Pulmonary Function Test (09/2018).   reports that he quit  smoking about 32 years ago. His smoking use included cigarettes. He has a 30.00 pack-year smoking history. He has never used smokeless tobacco.  Past Surgical History:  Procedure Laterality Date   CARDIOVERSION N/A 09/05/2018   Procedure:  CARDIOVERSION;  Surgeon: Jerline Pain, MD;  Location: [place];  Service: Cardiovascular;  Laterality: N/A;   CARDIOVERSION N/A 10/22/2018   Procedure: CARDIOVERSION;  Surgeon: Jerline Pain, MD;  Location: [place];  Service: Cardiovascular;  Laterality: N/A;   CARDIOVERSION N/A 08/28/2020   Procedure: CARDIOVERSION;  Surgeon: Josue [name], MD;  Location: [place];  Service: Cardiovascular;  Laterality: N/A;   CARDIOVERSION N/A 11/30/2020   Procedure: CARDIOVERSION;  Surgeon: Jerline Pain, MD;  Location: [facility] ENDOSCOPY;  Service: Cardiovascular;  Laterality: N/A;   CARDIOVERSION N/A 04/22/2021   Procedure: CARDIOVERSION;  Surgeon: Buford Dresser, MD;  Location: [facility] ENDOSCOPY;  Service: Cardiovascular;  Laterality: N/A;   CHOLECYSTECTOMY N/A 07/12/2016   Procedure: LAPAROSCOPIC CHOLECYSTECTOMY;  Surgeon: Donnie Mesa, MD;  Location: [place];  Service: General;  Laterality: N/A;   EXTRACORPOREAL SHOCK WAVE LITHOTRIPSY Right 11/18/2019   Procedure: EXTRACORPOREAL SHOCK WAVE LITHOTRIPSY (ESWL);  Surgeon: Lucas Mallow, MD;  Location: [facility];  Service: Urology;  Laterality: Right;   FOOT SURGERY Right    IR IMAGING GUIDED PORT INSERTION  03/19/2021   NASAL SINUS SURGERY     TEE WITHOUT CARDIOVERSION N/A 09/05/2018   Procedure: TRANSESOPHAGEAL ECHOCARDIOGRAM (TEE);  Surgeon: Jerline Pain, MD;  Location: [facility] ENDOSCOPY;  Service: Cardiovascular;  Laterality: N/A;   TEE WITHOUT CARDIOVERSION N/A 08/28/2020   Procedure: TRANSESOPHAGEAL ECHOCARDIOGRAM (TEE);  Surgeon: Josue [name], MD;  Location: [facility] ENDOSCOPY;  Service: Cardiovascular;  Laterality: N/A;   TONSILLECTOMY     UMBILICAL HERNIA REPAIR N/A 07/12/2016   Procedure: [place];  Surgeon: Donnie Mesa, MD;  Location: [place];  Service: General;  Laterality: N/A;   vericose vein stripping      Allergies  Allergen Reactions   Lipitor [Atorvastatin] Rash   Metformin Diarrhea     Immunization History  Administered Date(s) Administered   Influenza Split 06/09/2009, 07/19/2011, 09/05/2012, 06/19/2014, 06/25/2015   Influenza, High Dose Seasonal PF 06/07/2016, 06/12/2017, 07/04/2018, 05/31/2019   Influenza,inj,Quad PF,6+ Mos 06/11/2014, 06/25/2015   Influenza-Unspecified 07/21/2019   PFIZER(Purple Top)SARS-COV-2 Vaccination 02/13/2020, 03/05/2020, 06/01/2020   Pneumococcal Conjugate-13 07/09/2014   Pneumococcal Polysaccharide-23 12/08/2009   Td 09/09/2002, 12/18/2012   Zoster Recombinat (Shingrix) 06/14/2019   Zoster, Live 06/18/2012, 06/14/2019, 01/22/2020    Family History  Problem Relation Age of Onset   Heart attack Mother    Cancer Father        liver     Current Facility-Administered Medications:    (feeding supplement) PROSource Plus liquid 30 mL, 30 mL, Oral, BID BM, Adhikari, Amrit, MD, 30 mL at 08/22/21 2131   0.9 %  sodium chloride infusion, , Intravenous, PRN, Shelly Coss, MD, Stopped at 08/22/21 2319   albuterol (VENTOLIN HFA) 108 (90 Base) MCG/ACT inhaler 2 puff, 2 puff, Inhalation, Q2H PRN, Fredia Sorrow, MD, 2 puff at 08/22/21 2034   azithromycin (ZITHROMAX) 500 mg in sodium chloride 0.9 % 250 mL IVPB, 500 mg, Intravenous, Q24H, Tu, Ching T, DO, Stopped at 08/22/21 2300   benzonatate (TESSALON) capsule 200 mg, 200 mg, Oral, TID PRN, Tu, Ching T, DO   cefTRIAXone (ROCEPHIN) 2 g in sodium chloride 0.9 % 100 mL IVPB, 2 g, Intravenous, Q24H, Tu, Ching T, DO, Stopped at 08/22/21 2212  Chlorhexidine Gluconate Cloth 2 % PADS 6 each, 6 each, Topical, Daily, Kathryne Eriksson, NP, 6 each at 08/22/21 0933   feeding supplement (ENSURE ENLIVE / ENSURE PLUS) liquid 237 mL, 237 mL, Oral, BID BM, Adhikari, Amrit, MD, 237 mL at 08/22/21 1724   furosemide (LASIX) injection 80 mg, 80 mg, Intravenous, Q12H, Adhikari, Amrit, MD, 80 mg at 08/22/21 2132   ipratropium-albuterol (DUONEB) 0.5-2.5 (3) MG/3ML nebulizer solution 3 mL, 3 mL, Nebulization, Q6H PRN,  Tawanna Solo, Amrit, MD, 3 mL at 08/22/21 0831   levothyroxine (SYNTHROID) tablet 25 mcg, 25 mcg, Oral, Q0600, Tu, Ching T, DO, 25 mcg at 08/23/21 0545   MEDLINE mouth rinse, 15 mL, Mouth Rinse, BID, Adhikari, Amrit, MD, 15 mL at 08/22/21 2131   metoprolol succinate (TOPROL-XL) 24 hr tablet 12.5 mg, 12.5 mg, Oral, Daily, Tu, Ching T, DO, 12.5 mg at 08/22/21 0932   mometasone-formoterol (DULERA) 100-5 MCG/ACT inhaler 2 puff, 2 puff, Inhalation, BID, Tu, Ching T, DO, 2 puff at 08/22/21 2032   multivitamin with minerals tablet 1 tablet, 1 tablet, Oral, Daily, Adhikari, Amrit, MD, 1 tablet at 08/22/21 0932   oxyCODONE (Oxy IR/ROXICODONE) immediate release tablet 5 mg, 5 mg, Oral, Q6H PRN, Shelly Coss, MD   rivaroxaban (XARELTO) tablet 20 mg, 20 mg, Oral, Q supper, Arlyn Dunning M, RPH, 20 mg at 08/22/21 1723   rosuvastatin (CRESTOR) tablet 10 mg, 10 mg, Oral, Daily, Tu, Ching T, DO, 10 mg at 08/22/21 0933   tamsulosin (FLOMAX) capsule 0.4 mg, 0.4 mg, Oral, Daily, Tu, Ching T, DO, 0.4 mg at 08/22/21 0932   umeclidinium bromide (INCRUSE ELLIPTA) 62.5 MCG/ACT 1 puff, 1 puff, Inhalation, Daily, Tu, Ching T, DO, 1 puff at 08/22/21 [facility] Events:  07/23/2021 - admit 12/5 Continued 15L HFNC  Interim History / Subjective:   Pt sitting up in bed and wants to get up to the chair States that his breathing feels slightly improved   Objective   Blood pressure 139/67, pulse 75, temperature 98.2 F (36.8 C), temperature source Oral, resp. rate (!) 25, height 6\' 4"  (1.93 m), weight 121.5 kg, SpO2 93 %.        Intake/Output Summary (Last 24 hours) at 08/23/2021 0806 Last data filed at 08/23/2021 0600 Gross per 24 hour  Intake 850.43 ml  Output 650 ml  Net 200.43 ml    Filed Weights   08/19/21 1526 08/22/21 1005  Weight: 123.4 kg 121.5 kg    General:  obese M, sitting up in bed in no acute distress HEENT: MM pink/moist, nasal cannula in place Neuro: awake, alert,  conversational and oriented CV: s1s2 rrr, no m/r/g PULM:  seen on 15L HFNC, no respiratory distress, slightly tachypneic with walking to the chair, no wheezing or rhonchi GI: soft, bsx4 active  Extremities: warm/dry, no edema  Skin: no rashes or lesions   Resolved Hospital Problem list     Assessment & Plan:    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 Failure Superimposed on 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 drug induced lung injury from amiodarone/chemo and 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 (persistently PCR positive) with tobacco history and COPD Suspect secondary to CAP and pleural effusion -subjectively improving, though still requiring HFNC -High 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/4 consistent with UIP, R pleural effusion with airspace consolidation concerning for RLL PNA -continue Ceftriaxone/Azithromycin, BC negative thus far, extended RVP negative, Covid-19 remains positive would be a long time to be positive from infection in [place] has been on B-cell depleting therapy -  will try to obtain the PCR cycle threshold  -repeat US pending, may need thoracentesis if effusion enlarging, [place] 12/1 with small window -continue Albuterol, Dulera, Incruse Ellipta  -Bipap qhs -Pt had tapered off steroids prior to admission, resume prednisone 60mg  bid  -Quantiferon gold, Sjogrens syndrome, ANCA titers, Glomerular baseline membrane Ab, Anti-DNA ab, RF, ANA, legionella all pending -expanded RVP negative     B-cell Lymphoma with pan-cytopenia Right lower lobe lung mass -Appreciate heme/Onc recommendations, pt to receive IVIG -WBC and platelets improving, continue to trend -onset May 2022; status post CT-guided thoracic biopsy 03/09/2019 -biopsy features consistent with B-cell lymphoma -Bronchoscopy with alveolar lavage (lymphocytes would indicate lymphoma]-could be helpful but will require intubation  -enlarging RLL possibly secondary to worsening lymphoma, diagnostic and therapeutic thoracentesis if enough of a window on  [place]  MSK -Failure to thrive with significant weight loss -nutrition consult      Best practice (daily eval):  According to the hospitalist   Goals of Care:   Per primary team   ATTESTATION & SIGNATURE   CRITICAL CARE Performed by: Otilio Carpen Ambera Fedele   Total critical care time: 40 minutes  Critical care time was exclusive of separately billable procedures and treating other patients.  Critical care was necessary to treat or prevent imminent or life-threatening deterioration.  Critical care was time spent personally by me on the following activities: development of treatment plan with patient and/or surrogate as well as nursing, discussions with consultants, evaluation of patient's response to treatment, examination of patient, obtaining history from patient or surrogate, ordering and performing treatments and interventions, ordering and review of laboratory studies, ordering and review of radiographic studies, pulse oximetry and re-evaluation of patient's condition.  SIGNATURE   Otilio Carpen Gurney Balthazor, PA-C [facility] See Amion for pager If no response to pager , please call [phone] until 7pm After 7:00 pm call Elink  [phone]    LABS    PULMONARY Recent Labs  Lab 08/22/21 0840  PHART 7.425  PCO2ART 31.7*  PO2ART 89.7  HCO3 20.4  O2SAT 96.9     CBC Recent Labs  Lab 08/21/21 0432 08/22/21 0409 08/23/21 0330  HGB 10.2* 10.4* 11.0*  HCT 31.8* 32.7* 34.0*  WBC 0.9* 1.8* 3.7*  PLT 115* 107* 104*     COAGULATION No results for input(s): INR in the last 168 hours.  CARDIAC  No results for input(s): TROPONINI in the last 168 hours. No results for input(s): PROBNP in the last 168 hours.   CHEMISTRY Recent Labs  Lab 08/17/2021 1551 08/20/21 0403 08/21/21 0432 08/22/21 0409 08/23/21 0330  NA 134* 135 132* 132* 132*  K 4.4 3.5 3.5 3.5 3.7  CL 104 104 101 99 98  CO2 19* 22 24 23 23   GLUCOSE 190* 109* 139* 220* 172*  BUN 37*  35* 41* 45* 55*  CREATININE 2.13* 1.98* 2.09* 2.05* 2.27*  CALCIUM 8.4* 7.9* 7.7* 7.7* 8.1*    Estimated Creatinine Clearance: 37.6 mL/min (A) (by C-G formula based on SCr of 2.27 mg/dL (H)).   LIVER Recent Labs  Lab 08/17/2021 1551  AST 33  ALT 15  ALKPHOS 62  BILITOT 0.8  PROT 6.3*  ALBUMIN 2.7*      INFECTIOUS Recent Labs  Lab 08/09/2021 2206 08/22/21 1340 08/23/21 0330  LATICACIDVEN 1.3  --   --   PROCALCITON  --  0.21 0.33      ENDOCRINE CBG (last 3)  No results for input(s): GLUCAP in the last  72 hours.       IMAGING x48h  - image(s) personally visualized  -   highlighted in bold DG CHEST PORT 1 VIEW  Result Date: 08/22/2021 CLINICAL DATA:  80 year old male with increase shortness of breath. 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 Small pleural effusion not feasible for thoracentesis on 08/19/2021. EXAM: PORTABLE CHEST 1 VIEW COMPARISON:  Chest CT 08/17/2021 and earlier. FINDINGS: Portable AP semi upright view at 0843 hours. Stable right chest Port-A-Cath, power port which is currently accessed. Mildly lower lung volumes. Progressive failing and confluent opacity in the right lower lung and about the hilum. Stable visible mediastinal contours. Left lung appears stable. No pneumothorax or pulmonary edema. IMPRESSION: Progressive right airspace disease and/or pleural effusion since the CT on 08/01/2021. Repeat Chest Ultrasound for feasibility of thoracentesis now might be valuable. Lower lung volumes, left lung remains clear. Electronically Signed   By: Genevie Ann M.D.   On: 08/22/2021 08:57   ECHOCARDIOGRAM LIMITED  Result Date: 08/22/2021    ECHOCARDIOGRAM LIMITED REPORT   Patient Name:   Derek [name] Date of Exam: 08/22/2021 Medical Rec #:  [id number]   Height:       76.0 in Accession #:    [id number]  Weight:       267.9 lb Date of Birth:  Mar 08, 1941   BSA:          2.508 m Patient Age:    45 years    BP:           115/71 mmHg Patient Gender: M           HR:            80 bpm. Exam Location:  Inpatient Procedure: 2D Echo, Limited Echo and Intracardiac Opacification Agent Indications:    CHF  History:        Patient has prior history of Echocardiogram examinations. CAD,                 COPD; Risk Factors:Hypertension and Diabetes.  Sonographer:    Jyl Heinz Referring Phys: [id number] PAULA V ROSS IMPRESSIONS  1. Windows still challenging. With contrast, LV function is mildly reduced. Hypokinesis of the inferior, inferoseptal wall ; as well as anteroseptum as well. RV function is normal. IVC is normal and collapses RA pressure 3. FINDINGS  Left Ventricle: Windows still challenging. With contrast, LV function is mildly reduced. Hypokinesis of the inferior, inferoseptal wall ; as well as anteroseptum as well. RV function is normal. IVC is normal and collapses RA pressure 3.  LV Volumes (MOD) LV vol d, MOD A2C: 125.0 ml LV vol d, MOD A4C: 127.0 ml LV vol s, MOD A2C: 53.0 ml LV vol s, MOD A4C: 53.3 ml LV SV MOD A2C:     72.0 ml LV SV MOD A4C:     127.0 ml LV SV MOD BP:      75.6 ml IVC IVC diam: 1.30 cm Phineas Inches Electronically signed by Phineas Inches Signature Date/Time: 08/22/2021/1:49:49 PM    Final    ECHOCARDIOGRAM LIMITED  Result Date: 08/21/2021    ECHOCARDIOGRAM LIMITED REPORT   Patient Name:   Derek [name] Date of Exam: 08/21/2021 Medical Rec #:  [id number]   Height:       76.0 in Accession #:    [id number]  Weight:       272.0 lb Date of Birth:  14-Apr-1941   BSA:          2.524 m Patient Age:  80 years    BP:           127/64 mmHg Patient Gender: M           HR:           83 bpm. Exam Location:  Inpatient Procedure: Limited Echo, Limited Color Doppler and Cardiac Doppler Indications:    CHF-Acute Diastolic O27.74  History:        Patient has prior history of Echocardiogram examinations, most                 recent 06/07/2021. Cardiomyopathy; Risk Factors:Hypertension and                 Diabetes.  Sonographer:    Bernadene Person RDCS Referring Phys: [id number] AMRIT ADHIKARI  IMPRESSIONS  1. Poor acoustic windows limt study LVEF may be mildly depressed REcomm limited echo with Defiinity to confirm wall motion Images suggestive that inferior/inferoseptal walls are hypokinetic . There is moderate left ventricular hypertrophy.  2. Right ventricular systolic function is low normal. The right ventricular size is mildly enlarged. There is normal pulmonary artery systolic pressure.  3. MR is eccentric, directed posterior into LA. Probable moderate . Moderate mitral valve regurgitation.  4. The aortic valve is tricuspid. Aortic valve regurgitation is not visualized. Aortic valve sclerosis/calcification is present, without any evidence of aortic stenosis.  5. Aortic dilatation noted. There is mild dilatation of the aortic root, measuring 41 mm. There is mild dilatation of the ascending aorta, measuring 38 mm.  6. The inferior vena cava is normal in size with greater than 50% respiratory variability, suggesting right atrial pressure of 3 mmHg. FINDINGS  Left Ventricle: Poor acoustic windows limt study LVEF may be mildly depressed REcomm limited echo with Defiinity to confirm wall motion Images suggestive that inferior/inferoseptal walls are hypokinetic. The left ventricular internal cavity size was normal in size. There is moderate left ventricular hypertrophy. Right Ventricle: The right ventricular size is mildly enlarged. Right ventricular systolic function is low normal. There is normal pulmonary artery systolic pressure. The tricuspid regurgitant velocity is 2.38 m/s, and with an assumed right atrial pressure of 3 mmHg, the estimated right ventricular systolic pressure is 67.2 mmHg. Left Atrium: Left atrial size was normal in size. Right Atrium: Right atrial size was normal in size. Pericardium: There is no evidence of pericardial effusion. Mitral Valve: MR is eccentric, directed posterior into LA. Probable moderate. There is mild thickening of the mitral valve leaflet(s). Moderate mitral  valve regurgitation. Tricuspid Valve: The tricuspid valve is grossly normal. Tricuspid valve regurgitation is mild. Aortic Valve: The aortic valve is tricuspid. Aortic valve regurgitation is not visualized. Aortic valve sclerosis/calcification is present, without any evidence of aortic stenosis. Pulmonic Valve: The pulmonic valve was not well visualized. Pulmonic valve regurgitation is not visualized. Aorta: Aortic dilatation noted. There is mild dilatation of the aortic root, measuring 41 mm. There is mild dilatation of the ascending aorta, measuring 38 mm. Venous: The inferior vena cava is normal in size with greater than 50% respiratory variability, suggesting right atrial pressure of 3 mmHg. IAS/Shunts: The interatrial septum was not assessed. LEFT VENTRICLE PLAX 2D LVIDd:         4.50 cm     Diastology LVIDs:         2.60 cm     LV e' medial:    3.38 cm/s LV PW:         1.50 cm     LV E/e' medial:  19.7  LV IVS:        1.30 cm     LV e' lateral:   6.08 cm/s LVOT diam:     2.10 cm     LV E/e' lateral: 10.9 LV SV:         62 LV SV Index:   25 LVOT Area:     3.46 cm  LV Volumes (MOD) LV vol d, MOD A2C: 96.0 ml LV vol d, MOD A4C: 81.0 ml LV vol s, MOD A2C: 51.6 ml LV vol s, MOD A4C: 41.9 ml LV SV MOD A2C:     44.4 ml LV SV MOD A4C:     81.0 ml LV SV MOD BP:      41.0 ml RIGHT VENTRICLE RV S prime:     13.50 cm/s TAPSE (M-mode): 2.5 cm LEFT ATRIUM           Index LA diam:      3.90 cm 1.54 cm/m LA Vol (A4C): 46.2 ml 18.30 ml/m  AORTIC VALVE LVOT Vmax:   103.00 cm/s LVOT Vmean:  69.400 cm/s LVOT VTI:    0.180 m  AORTA Ao Root diam: 4.10 cm Ao Asc diam:  3.80 cm MITRAL VALVE                  TRICUSPID VALVE MV Area (PHT): 3.46 cm       TR Peak grad:   22.7 mmHg MV Decel Time: 219 msec       TR Vmax:        238.00 cm/s MR Peak grad:    118.8 mmHg MR Mean grad:    81.0 mmHg    SHUNTS MR Vmax:         545.00 cm/s  Systemic VTI:  0.18 m MR Vmean:        423.0 cm/s   Systemic Diam: 2.10 cm MR PISA:         1.57 cm MR  PISA Eff ROA: 11 mm MR PISA Radius:  0.50 cm MV E velocity: 66.50 cm/s MV A velocity: 48.00 cm/s MV E/A ratio:  1.39 Dorris Carnes MD Electronically signed by Dorris Carnes MD Signature Date/Time: 08/21/2021/4:03:52 PM    Final

## 2021-08-23 NOTE — Progress Notes (Addendum)
Progress Note  Patient Name: Derek [name] Date of Encounter: 08/23/2021  [facility] Cardiologist: [name], MD   Subjective   He says his breathing is worse today than it was yesterday. He says he was doing all right yesterday during the day, but they tried to use CPAP on him last night and it bothered him all night long.  He says his breathing today is worse than it was.  Inpatient Medications    Scheduled Meds:  (feeding supplement) PROSource Plus  30 mL Oral BID BM   Chlorhexidine Gluconate Cloth  6 each Topical Daily   feeding supplement  237 mL Oral BID BM   furosemide  80 mg Intravenous Q12H   levothyroxine  25 mcg Oral Q0600   mouth rinse  15 mL Mouth Rinse BID   metoprolol succinate  12.5 mg Oral Daily   mometasone-formoterol  2 puff Inhalation BID   multivitamin with minerals  1 tablet Oral Daily   rivaroxaban  15 mg Oral Q supper   rosuvastatin  10 mg Oral Daily   tamsulosin  0.4 mg Oral Daily   umeclidinium bromide  1 puff Inhalation Daily   Continuous Infusions:  sodium chloride Stopped (08/22/21 2319)   azithromycin Stopped (08/22/21 2300)   cefTRIAXone (ROCEPHIN)  IV Stopped (08/22/21 2212)   IMMUNE GLOBULIN 10% (HUMAN) IV - For Fluid Restriction Only     PRN Meds: sodium chloride, albuterol, benzonatate, ipratropium-albuterol, oxyCODONE   Vital Signs    Vitals:   08/23/21 0401 08/23/21 0500 08/23/21 0553 08/23/21 0600  BP:  139/67    Pulse: 76 75 84 75  Resp: 20 (!) 25 12 (!) 25  Temp:      TempSrc:      SpO2: 96% 95% 94% 93%  Weight:      Height:        Intake/Output Summary (Last 24 hours) at 08/23/2021 1116 Last data filed at 08/23/2021 0600 Gross per 24 hour  Intake 850.43 ml  Output 650 ml  Net 200.43 ml   Last 3 Weights 08/22/2021 08/19/2021 07/21/2021  Weight (lbs) 267 lb 13.7 oz 272 lb 277 lb  Weight (kg) 121.5 kg 123.378 kg 125.646 kg      Telemetry    Sinus rhythm, no significant ectopy and no PAF- Personally  Reviewed  ECG    None today- Personally Reviewed  Physical Exam   GEN: Elderly, chronically ill-appearing male, acute respiratory distress.   Neck: No JVD seen, difficult to assess secondary to body habitus Cardiac: RRR, no murmurs, rubs, or gallops.  Respiratory: Rales bilateral bases GI: Soft, nontender, non-distended  MS: Trace edema; No deformity. Neuro:  Nonfocal  Psych: Normal affect   Labs    High Sensitivity Troponin:  No results for input(s): TROPONINIHS in the last 720 hours.   Chemistry Recent Labs  Lab 08/07/2021 1551 08/20/21 0403 08/21/21 0432 08/22/21 0409 08/23/21 0330  NA 134*   < > 132* 132* 132*  K 4.4   < > 3.5 3.5 3.7  CL 104   < > 101 99 98  CO2 19*   < > 24 23 23   GLUCOSE 190*   < > 139* 220* 172*  BUN 37*   < > 41* 45* 55*  CREATININE 2.13*   < > 2.09* 2.05* 2.27*  CALCIUM 8.4*   < > 7.7* 7.7* 8.1*  PROT 6.3*  --   --   --   --   ALBUMIN 2.7*  --   --   --   --  AST 33  --   --   --   --   ALT 15  --   --   --   --   ALKPHOS 62  --   --   --   --   BILITOT 0.8  --   --   --   --   GFRNONAA 31*   < > 32* 32* 29*  ANIONGAP 11   < > 7 10 11    < > = values in this interval not displayed.    Lipids No results for input(s): CHOL, TRIG, HDL, LABVLDL, LDLCALC, CHOLHDL in the last 168 hours.  Hematology Recent Labs  Lab 08/21/21 0432 08/22/21 0409 08/23/21 0330  WBC 0.9* 1.8* 3.7*  RBC 3.72* 3.85* 4.09*  HGB 10.2* 10.4* 11.0*  HCT 31.8* 32.7* 34.0*  MCV 85.5 84.9 83.1  MCH 27.4 27.0 26.9  MCHC 32.1 31.8 32.4  RDW 16.5* 16.5* 16.6*  PLT 115* 107* 104*   Thyroid No results for input(s): TSH, FREET4 in the last 168 hours.  BNP Recent Labs  Lab 08/19/21 1300  BNP 221.4*    DDimer No results for input(s): DDIMER in the last 168 hours.   Radiology    CT Chest High Resolution  Result Date: 08/23/2021 CLINICAL DATA:  80 year old male with history of shortness of breath. Evaluate for interstitial lung disease. EXAM: CT CHEST WITHOUT  CONTRAST TECHNIQUE: Multidetector CT imaging of the chest was performed following the standard protocol without intravenous contrast. High resolution imaging of the lungs, as well as inspiratory and expiratory imaging, was performed. COMPARISON:  No priors. FINDINGS: Cardiovascular: Heart size is normal. There is no significant pericardial fluid, thickening or pericardial calcification. There is aortic atherosclerosis, as well as atherosclerosis of the great vessels of the mediastinum and the coronary arteries, including calcified atherosclerotic plaque in the left main, left anterior descending, left circumflex and right coronary arteries. Mild calcifications of the aortic valve. Right internal jugular single-lumen porta cath with tip terminating in the distal superior vena cava. Mediastinum/Nodes: No pathologically enlarged mediastinal or hilar lymph nodes. Please note that accurate exclusion of hilar adenopathy is limited on noncontrast CT scans. Esophagus is unremarkable in appearance. No axillary lymphadenopathy. Lungs/Pleura: High-resolution imaging is limited by presence of a moderate-sized right-sided pleural effusion which is associated with near complete atelectasis and consolidation of the right lower lobe. With these limitations in mind, in the well aerated portions of the lungs there are patchy areas of ground-glass attenuation, septal thickening, small amount of subpleural reticulation and thickening of the peribronchovascular interstitium with some regional architectural distortion. No definite traction bronchiectasis or frank honeycombing confidently identified. Inspiratory and expiratory imaging is remarkable for very mild air trapping indicative of small airways disease. Upper Abdomen: Esophagus is unremarkable in appearance. No axillary lymphadenopathy. Musculoskeletal: There are no aggressive appearing lytic or blastic lesions noted in the visualized portions of the skeleton. IMPRESSION: 1. There  is evidence of interstitial lung disease, with a spectrum of findings considered indeterminate for usual interstitial pneumonia (UIP) per current ATS guidelines. Follow-up high-resolution chest CT is recommended in 12 months to assess for temporal changes in the appearance of the lung parenchyma. 2. Moderate right pleural effusion with a combination of airspace consolidation and atelectasis in the right lower lobe, concerning for right lower lobe pneumonia. 3. Aortic atherosclerosis, in addition to left main and 3 vessel coronary artery disease. Assessment for potential risk factor modification, dietary therapy or pharmacologic therapy may be warranted, if clinically indicated.  4. There are calcifications of the aortic valve. Echocardiographic correlation for evaluation of potential valvular dysfunction may be warranted if clinically indicated. Aortic Atherosclerosis (ICD10-I70.0). Electronically Signed   By: Vinnie Langton M.D.   On: 08/23/2021 08:18   DG CHEST PORT 1 VIEW  Result Date: 08/22/2021 CLINICAL DATA:  80 year old male with increase shortness of breath. 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 Small pleural effusion not feasible for thoracentesis on 08/19/2021. EXAM: PORTABLE CHEST 1 VIEW COMPARISON:  Chest CT 08/07/2021 and earlier. FINDINGS: Portable AP semi upright view at 0843 hours. Stable right chest Port-A-Cath, power port which is currently accessed. Mildly lower lung volumes. Progressive failing and confluent opacity in the right lower lung and about the hilum. Stable visible mediastinal contours. Left lung appears stable. No pneumothorax or pulmonary edema. IMPRESSION: Progressive right airspace disease and/or pleural effusion since the CT on 07/30/2021. Repeat Chest Ultrasound for feasibility of thoracentesis now might be valuable. Lower lung volumes, left lung remains clear. Electronically Signed   By: Genevie Ann M.D.   On: 08/22/2021 08:57   ECHOCARDIOGRAM LIMITED  Result Date:  08/22/2021    ECHOCARDIOGRAM LIMITED REPORT   Patient Name:   CYREE CHUONG Date of Exam: 08/22/2021 Medical Rec #:  [id number]   Height:       76.0 in Accession #:    [id number]  Weight:       267.9 lb Date of Birth:  1940-11-03   BSA:          2.508 m Patient Age:    38 years    BP:           115/71 mmHg Patient Gender: M           HR:           80 bpm. Exam Location:  Inpatient Procedure: 2D Echo, Limited Echo and Intracardiac Opacification Agent Indications:    CHF  History:        Patient has prior history of Echocardiogram examinations. CAD,                 COPD; Risk Factors:Hypertension and Diabetes.  Sonographer:    Jyl Heinz Referring Phys: [id number] PAULA V ROSS IMPRESSIONS  1. Windows still challenging. With contrast, LV function is mildly reduced. Hypokinesis of the inferior, inferoseptal wall ; as well as anteroseptum as well. RV function is normal. IVC is normal and collapses RA pressure 3. FINDINGS  Left Ventricle: Windows still challenging. With contrast, LV function is mildly reduced. Hypokinesis of the inferior, inferoseptal wall ; as well as anteroseptum as well. RV function is normal. IVC is normal and collapses RA pressure 3.  LV Volumes (MOD) LV vol d, MOD A2C: 125.0 ml LV vol d, MOD A4C: 127.0 ml LV vol s, MOD A2C: 53.0 ml LV vol s, MOD A4C: 53.3 ml LV SV MOD A2C:     72.0 ml LV SV MOD A4C:     127.0 ml LV SV MOD BP:      75.6 ml IVC IVC diam: 1.30 cm Phineas Inches Electronically signed by Phineas Inches Signature Date/Time: 08/22/2021/1:49:49 PM    Final    ECHOCARDIOGRAM LIMITED  Result Date: 08/21/2021    ECHOCARDIOGRAM LIMITED REPORT   Patient Name:   MAURO ARPS Date of Exam: 08/21/2021 Medical Rec #:  [id number]   Height:       76.0 in Accession #:    [id number]  Weight:       272.0 lb Date of  Birth:  [birth year]   BSA:          2.524 m Patient Age:    53 years    BP:           127/64 mmHg Patient Gender: M           HR:           83 bpm. Exam Location:  Inpatient Procedure: Limited Echo, Limited  Color Doppler and Cardiac Doppler Indications:    CHF-Acute Diastolic Y81.44  History:        Patient has prior history of Echocardiogram examinations, most                 recent 06/07/2021. Cardiomyopathy; Risk Factors:Hypertension and                 Diabetes.  Sonographer:    Bernadene Person RDCS Referring Phys: [id number] AMRIT ADHIKARI IMPRESSIONS  1. Poor acoustic windows limt study LVEF may be mildly depressed REcomm limited echo with Defiinity to confirm wall motion Images suggestive that inferior/inferoseptal walls are hypokinetic . There is moderate left ventricular hypertrophy.  2. Right ventricular systolic function is low normal. The right ventricular size is mildly enlarged. There is normal pulmonary artery systolic pressure.  3. MR is eccentric, directed posterior into LA. Probable moderate . Moderate mitral valve regurgitation.  4. The aortic valve is tricuspid. Aortic valve regurgitation is not visualized. Aortic valve sclerosis/calcification is present, without any evidence of aortic stenosis.  5. Aortic dilatation noted. There is mild dilatation of the aortic root, measuring 41 mm. There is mild dilatation of the ascending aorta, measuring 38 mm.  6. The inferior vena cava is normal in size with greater than 50% respiratory variability, suggesting right atrial pressure of 3 mmHg. FINDINGS  Left Ventricle: Poor acoustic windows limt study LVEF may be mildly depressed REcomm limited echo with Defiinity to confirm wall motion Images suggestive that inferior/inferoseptal walls are hypokinetic. The left ventricular internal cavity size was normal in size. There is moderate left ventricular hypertrophy. Right Ventricle: The right ventricular size is mildly enlarged. Right ventricular systolic function is low normal. There is normal pulmonary artery systolic pressure. The tricuspid regurgitant velocity is 2.38 m/s, and with an assumed right atrial pressure of 3 mmHg, the estimated right ventricular systolic  pressure is 49.7 mmHg. Left Atrium: Left atrial size was normal in size. Right Atrium: Right atrial size was normal in size. Pericardium: There is no evidence of pericardial effusion. Mitral Valve: MR is eccentric, directed posterior into LA. Probable moderate. There is mild thickening of the mitral valve leaflet(s). Moderate mitral valve regurgitation. Tricuspid Valve: The tricuspid valve is grossly normal. Tricuspid valve regurgitation is mild. Aortic Valve: The aortic valve is tricuspid. Aortic valve regurgitation is not visualized. Aortic valve sclerosis/calcification is present, without any evidence of aortic stenosis. Pulmonic Valve: The pulmonic valve was not well visualized. Pulmonic valve regurgitation is not visualized. Aorta: Aortic dilatation noted. There is mild dilatation of the aortic root, measuring 41 mm. There is mild dilatation of the ascending aorta, measuring 38 mm. Venous: The inferior vena cava is normal in size with greater than 50% respiratory variability, suggesting right atrial pressure of 3 mmHg. IAS/Shunts: The interatrial septum was not assessed. LEFT VENTRICLE PLAX 2D LVIDd:         4.50 cm     Diastology LVIDs:         2.60 cm     LV e' medial:    3.38  cm/s LV PW:         1.50 cm     LV E/e' medial:  19.7 LV IVS:        1.30 cm     LV e' lateral:   6.08 cm/s LVOT diam:     2.10 cm     LV E/e' lateral: 10.9 LV SV:         62 LV SV Index:   25 LVOT Area:     3.46 cm  LV Volumes (MOD) LV vol d, MOD A2C: 96.0 ml LV vol d, MOD A4C: 81.0 ml LV vol s, MOD A2C: 51.6 ml LV vol s, MOD A4C: 41.9 ml LV SV MOD A2C:     44.4 ml LV SV MOD A4C:     81.0 ml LV SV MOD BP:      41.0 ml RIGHT VENTRICLE RV S prime:     13.50 cm/s TAPSE (M-mode): 2.5 cm LEFT ATRIUM           Index LA diam:      3.90 cm 1.54 cm/m LA Vol (A4C): 46.2 ml 18.30 ml/m  AORTIC VALVE LVOT Vmax:   103.00 cm/s LVOT Vmean:  69.400 cm/s LVOT VTI:    0.180 m  AORTA Ao Root diam: 4.10 cm Ao Asc diam:  3.80 cm MITRAL VALVE                   TRICUSPID VALVE MV Area (PHT): 3.46 cm       TR Peak grad:   22.7 mmHg MV Decel Time: 219 msec       TR Vmax:        238.00 cm/s MR Peak grad:    118.8 mmHg MR Mean grad:    81.0 mmHg    SHUNTS MR Vmax:         545.00 cm/s  Systemic VTI:  0.18 m MR Vmean:        423.0 cm/s   Systemic Diam: 2.10 cm MR PISA:         1.57 cm MR PISA Eff ROA: 11 mm MR PISA Radius:  0.50 cm MV E velocity: 66.50 cm/s MV A velocity: 48.00 cm/s MV E/A ratio:  1.39 Dorris Carnes MD Electronically signed by Dorris Carnes MD Signature Date/Time: 08/21/2021/4:03:52 PM    Final     Cardiac Studies   ECHO: LIMITED 08/22/2021 IMPRESSIONS   1. Windows still challenging. With contrast, LV function is mildly  reduced. Hypokinesis of the inferior, inferoseptal wall ; as well as  anteroseptum as well. RV function is normal. IVC is normal and collapses  RA pressure 3.   ECHO: LIMITED 08/21/2021  1. Poor acoustic windows limt study LVEF may be mildly depressed REcomm limited echo with Defiinity to confirm wall motion Images suggestive that inferior/inferoseptal walls are hypokinetic . There is moderate left ventricular hypertrophy.   2. Right ventricular systolic function is low normal. The right  ventricular size is mildly enlarged. There is normal pulmonary artery  systolic pressure.   3. MR is eccentric, directed posterior into LA. Probable moderate .  Moderate mitral valve regurgitation.   4. The aortic valve is tricuspid. Aortic valve regurgitation is not  visualized. Aortic valve sclerosis/calcification is present, without any  evidence of aortic stenosis.   5. Aortic dilatation noted. There is mild dilatation of the aortic root,  measuring 41 mm. There is mild dilatation of the ascending aorta,  measuring 38 mm.   6. The inferior  vena cava is normal in size with greater than 50%  respiratory variability, suggesting right atrial pressure of 3 mmHg.   ECHO: 06/07/2021  1. Left ventricular ejection fraction, by estimation,  is 55 to 60%. The  left ventricle has normal function. The left ventricle has no regional  wall motion abnormalities. There is mild left ventricular hypertrophy.  Left ventricular diastolic parameters  are indeterminate.   2. Right ventricular systolic function is normal. The right ventricular  size is normal.   3. The mitral valve is normal in structure. Trivial mitral valve  regurgitation.   4. The aortic valve is normal in structure. Aortic valve regurgitation is  not visualized. No aortic stenosis is present.  Patient Profile     80 y.o. male  with a history of hypertension, CAD (prior cath 85 to 95% diagonal branch, occluded OM 2.  Plan for medical therapy), chronic kidney disease, type 2 diabetes, hyperlipidemia with intolerance to Lipitor, atrial flutter/fib.  The patient has had multiple previous cardioversions (2019, 2020, 2021, March 2022).  His CHA2DS2-VASc score is 5.  He he is maintained on amiodarone.  In August 2022 it was increased to 200 twice daily with plan for repeat cardioversion.  PET scan in September showed a hypermetabolic right upper lobe lung mass was decreasing in size patchy groundglass appearance in both lungs concern for drug reaction.  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  Treated with prednisone chronically.  Note he has remained COVID-positive since infection in February.  Patient has been off chemotherapy for 3 months due to persistent respiratory problems and possible infection.  Note 60 pound weight loss over 8 months due to decreased appetite.   The patient presented to [facility] on 11/30 with complaints of increaed SOB for 3 months  On admit, the patient was hypoxic.  Sats on room air 85%.  Lab work showed pancytopenia.  CT scan showed a moderate right pleural effusion diffuse right lung peripheral solid and groundglass nodular densities, question infectious versus neoplastic.  Started on empiric antibiotics.  IV  Lasix also started due to shortness of breath and bilateral lower extremity edema.  On 12/04, Cards asked to see.  Assessment & Plan    Acute CHF -EF mildly decreased with wall motion abnormalities which are new - He has been on IV Lasix 80 mg every 12 hours, he is net -1.3 L since admission - Weight is down 2 kg since admission, 268 pounds today - However, in conversation with the nursing staff and his wife, she has made sure not to dump urine without the nurse measuring at first. -He had Lasix 80 mg IV this a.m. and has not put out any urine since then - Also, his BUN and creatinine are rising and RA pressure was 3 on his 2 recent echoes - It may be that his volume status is good, and its the pneumonitis that is causing his hypoxia - Will hold lasix - monitor volume status  2.  History of atrial fibrillation: - he was on amiodarone at 200 mg daily prior to admission, this was at first continued, but is on hold because of the possibility of a reaction to the amiodarone - He was on Xarelto 20 mg daily for anticoagulation, continued but dose adjusted to 15 mg daily for his creatinine  3.  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 - He also had COVID and continues to test positive, it is unclear if he has had reinfection or if this is long COVID - Per CCM note today, his ILD has been getting progressively worse as has his hypoxemia, with air bronchograms in right lower lobe density -Management per CCM/IM   For questions or updates, please contact [facility] Please consult [email or web address] for contact info under   Pixie Casino, MD, FACC, [place] Director of the [facility] Diplomate of the American Board of Clinical Lipidology Attending Cardiologist  Direct Dial: [phone]  Fax: [phone]   Website:  [email or web address]

## 2021-08-23 NOTE — Progress Notes (Signed)
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  Dyspnea has been going on for last 3 months.  He follows with pulmonology.  He is on 20 mg of prednisone chronically as per oncology.  His last appointment with pulmonology was on 11/2.  Patient's reported fever of 103 Fahrenheit at home.  He was diagnosed with COVID back in February but continues to be positive.  He is off chemotherapy for the last 3 months due to persistent respiratory issues, history of recent weight loss.  On presentation he was afebrile, normotensive but hypoxic on room air with 85% saturation.  He was placed on 4 L of nasal cannula.  Lab work showed pancytopenia .CT chest showed new moderate right pleural effusion, diffuse right peripheral solid and groundglass nodular densities representing infectious versus neoplastic process.  Started on azithromycin and ceftriaxone for pneumonia.  Also started on Lasix IV for severe volume overload, echocardiogram ordered to rule out CHF.  Hospital course remarkable for worsening respiratory status, requiring 15 L of oxygen in the AM of 08/22/21.  Patient  transferred to stepdown.  PCCM and cardiology consulted and following.Oncology also following  Assessment & Plan:   Principal Problem:   Acute respiratory failure with hypoxia (HCC) Active Problems:   Diabetes mellitus, type II (HCC)   Diffuse large B cell lymphoma (HCC)   Thrombocytopenia (HCC)   Chronic kidney disease (CKD), 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HCC)   PAF (paroxysmal atrial fibrillation) (HCC)   COPD (chronic obstructive pulmonary disease) ([place])    Community acquired pneumonia   Pleural effusion   Leukopenia   Neutropenia (HCC)   Hypothyroidism   Acute respiratory acidosis (HCC)   Acute hypoxic respiratory failure: Hypoxic on presentation.  Thought to be secondary to possible right-sided pneumonia/right-sided pleural effusion, and possibly secondary to pulmonary edema.  Also has history of COPD, history of pneumonitis from COVID/chemotherapy.  CT chest also showed diffuse right peripheral solid and groundglass nodular densities representing infectious versus neoplastic process.  He was put on 4 L of oxygen per minute on presentation Started on management for community-acquired pneumonia.  Currently on Rocephin, azithromycin. He follows with Dr. [name].  He is not on home oxygen.   Patient decompensated now requiring more oxygen on 08/22/21.  Chest x-ray showed radiopaque density in the right lower lobe could be progression  of  lymphoma versus pleural effusion.  High-resolution CT scan showed findings of interstitial lung disease.  Also showed moderate right pleural effusion with atelectasis of the lower lobe. planning for thoracentesis.  PCCM following.  PCCM ordered autoimmune work-up, Legionella: pending  On 15 L of oxygen this morning  Lymphoma/hypogammaglobinemia: Oncology following.  His pulmonary findings could be secondary to progression of lymphoma.  Oncology planning to start on IVIG  Congestive heart failure/volume overload: Had severe bilateral lower extremity edema. Elevated  BNP, started on  Lasix 80 mg IV twice a day. Needs to rule out chemotherapy-induced cardiomyopathy.  Echo done here showed inferior/inferoseptal wall hypokinesis, moderate left ventricular hypertrophy, low normal right ventricular systolic function, moderate mitral valve regurgitation.  Cardiology consulted and following  Pancytopenia: Secondary to history of large B-cell lymphoma/chemotherapy.   He follows with  Dr. Alvy Bimler.  Oncology following here.  On  Granix for low ANC,now D/ced  History of paroxysmal A. fib/a flutter: On Xarelto which will be continued.  Also on amiodarone and metoprolol.  History of coronary artery disease: On statin, beta-blocker  Diabetes type 2: Monitor blood sugars.  Continue sliding scale  insulin for now.  Hypothyroidism: On levothyroxine  CKD stage IV: Currently creatinine at baseline and improving with diuresis.  Baseline creatinine around 2.2.  Continue to monitor.  Avoid nephrotoxins  Debility/deconditioning/weakness: Has been very weak recently.  We will request for PT/OT evaluation.  Lives with his son at home  Obesity: BMI 32.6  Nutrition Problem: Moderate Malnutrition Etiology: chronic illness, cancer and cancer related treatments      DVT prophylaxis:Xarelto Code Status: Full Family Communication: Significant other at bedside on 08/23/21 Patient status:Inpatient  Dispo: The patient is from: Home              Anticipated d/c is to: Home              Anticipated d/c date is: Not sure for now  Consultants: Oncology  Procedures:None  Antimicrobials:  Anti-infectives (From admission, onward)    Start     Dose/Rate Route Frequency Ordered Stop   08/19/21 2200  cefTRIAXone (ROCEPHIN) 2 g in sodium chloride 0.9 % 100 mL IVPB        2 g 200 mL/hr over 30 Minutes Intravenous Every 24 hours 08/10/2021 2314 08/24/21 2159   08/19/21 2000  azithromycin (ZITHROMAX) 500 mg in sodium chloride 0.9 % 250 mL IVPB        500 mg 250 mL/hr over 60 Minutes Intravenous Every 24 hours 07/28/2021 2314 08/24/21 1959   07/26/2021 2030  cefTRIAXone (ROCEPHIN) 1 g in sodium chloride 0.9 % 100 mL IVPB        1 g 200 mL/hr over 30 Minutes Intravenous  Once 08/10/2021 2018 08/06/2021 2117   07/22/2021 2030  azithromycin (ZITHROMAX) 500 mg in sodium chloride 0.9 % 250 mL IVPB        500 mg 250 mL/hr over 60 Minutes Intravenous  Once 08/11/2021 2018 07/30/2021 2321       Subjective:  Patient seen and examined at the bedside  this morning.  Hemodynamically stable.  Sitting in the chair.  On 15 L of oxygen per minute.  Complains of shortness of breath but not in severe distress.  Objective: Vitals:   08/23/21 0401 08/23/21 0500 08/23/21 0553 08/23/21 0600  BP:  139/67    Pulse: 76 75 84 75  Resp: 20 (!) 25 12 (!) 25  Temp:      TempSrc:      SpO2: 96% 95% 94% 93%  Weight:      Height:        Intake/Output Summary (Last 24 hours) at 08/23/2021 1884 Last data filed at 08/23/2021 0600 Gross per 24 hour  Intake 850.43 ml  Output 650 ml  Net 200.43 ml   Filed Weights   08/19/21 1526 08/22/21 1005  Weight: 123.4 kg 121.5 kg    Examination:   General exam: Very deconditioned, chronically looking, obese HEENT: PERRL Respiratory system: Bilateral diminished air sounds Cardiovascular system: S1 & S2 heard, RRR.  [place] Gastrointestinal system: Abdomen is nondistended, soft and nontender. Central nervous system: Alert and oriented Extremities: No edema, no clubbing ,no cyanosis Skin: No rashes, no ulcers,no icterus    Data Reviewed: I have personally reviewed following labs and imaging studies  CBC: Recent Labs  Lab 08/02/2021 1551 08/19/21 0434 08/20/21 0403 08/21/21 0432 08/22/21 0409 08/23/21 0330  WBC 2.3* 1.3* 1.2* 0.9* 1.8* 3.7*  NEUTROABS 1.5*  --  0.4* 0.2* 0.9* 2.2  HGB 11.3* 10.0* 10.3* 10.2* 10.4* 11.0*  HCT 35.8* 31.4* 32.3* 31.8* 32.7* 34.0*  MCV 87.3 85.6 85.2 85.5 84.9 83.1  PLT 135* 119* 124* 115* 107* 354*   Basic Metabolic Panel: Recent Labs  Lab 08/06/2021 1551 08/20/21 0403 08/21/21 0432 08/22/21 0409 08/23/21 0330  NA 134* 135 132* 132* 132*  K 4.4 3.5 3.5 3.5 3.7  CL 104 104 101 99 98  CO2 19* 22 24 23 23   GLUCOSE 190* 109* 139* 220* 172*  BUN 37* 35* 41* 45* 55*  CREATININE 2.13* 1.98* 2.09* 2.05* 2.27*  CALCIUM 8.4* 7.9* 7.7* 7.7* 8.1*   GFR: Estimated Creatinine Clearance: 37.6 mL/min (A) (by C-G formula based on SCr of 2.27 mg/dL (H)). Liver Function  Tests: Recent Labs  Lab 07/31/2021 1551  AST 33  ALT 15  ALKPHOS 62  BILITOT 0.8  PROT 6.3*  ALBUMIN 2.7*   No results for input(s): LIPASE, AMYLASE in the last 168 hours. No results for input(s): AMMONIA in the last 168 hours. Coagulation Profile: No results for input(s): INR, PROTIME in the last 168 hours. Cardiac Enzymes: No results for input(s): CKTOTAL, CKMB, CKMBINDEX, TROPONINI in the last 168 hours. BNP (last 3 results) No results for input(s): PROBNP in the last 8760 hours. HbA1C: No results for input(s): HGBA1C in the last 72 hours. CBG: No results for input(s): GLUCAP in the last 168 hours. Lipid Profile: No results for input(s): CHOL, HDL, LDLCALC, TRIG, CHOLHDL, LDLDIRECT in the last 72 hours. Thyroid Function Tests: No results for input(s): TSH, T4TOTAL, FREET4, T3FREE, THYROIDAB in the last 72 hours. Anemia Panel: No results for input(s): VITAMINB12, FOLATE, FERRITIN, TIBC, IRON, RETICCTPCT in the last 72 hours. Sepsis Labs: Recent Labs  Lab 08/14/2021 2206 08/22/21 1340 08/23/21 0330  PROCALCITON  --  0.21 0.33  LATICACIDVEN 1.3  --   --     Recent Results (from the past 240 hour(s))  Resp Panel by RT-PCR (Flu A&B, Covid) Nasopharyngeal Swab     Status: Abnormal   Collection Time: 08/15/2021  3:52 PM   Specimen: Nasopharyngeal Swab; Nasopharyngeal(NP) swabs in vial transport medium  Result Value Ref Range Status   SARS Coronavirus 2 by RT PCR POSITIVE (A) NEGATIVE Final    Comment: CRITICAL RESULT CALLED TO, READ BACK BY AND VERIFIED WITH: RN N WHEATLEY AT 1957 08/08/2021 CRUICKSHANK A (NOTE) SARS-CoV-2 target nucleic acids are DETECTED.  The SARS-CoV-2 RNA is generally detectable in upper respiratory specimens during the acute phase of infection. Positive results are indicative of the presence of the identified virus, but do not rule out bacterial infection or co-infection with other pathogens not detected by the test. Clinical correlation with patient  history and other diagnostic information is necessary to determine patient infection status. The expected result is Negative.  Fact Sheet for Patients: EntrepreneurPulse.com.au  Fact Sheet for Healthcare Providers: IncredibleEmployment.be  This test is not yet approved or cleared by the [place] FDA and  has been authorized for detection and/or diagnosis of SARS-CoV-2 by FDA under an Emergency Use Authorization (EUA).  This EUA will remain in effect (mean ing this test can be used) for the duration of  the COVID-19 declaration under Section 564(b)(1) of the Act, 21 U.S.C. section 360bbb-3(b)(1), unless the authorization is terminated or revoked sooner.  Influenza A by PCR NEGATIVE NEGATIVE Final   Influenza B by PCR NEGATIVE NEGATIVE Final    Comment: (NOTE) The Xpert Xpress SARS-CoV-2/FLU/RSV plus assay is intended as an aid in the diagnosis of influenza from Nasopharyngeal swab specimens and should not be used as a sole basis for treatment. Nasal washings and aspirates are unacceptable for Xpert Xpress SARS-CoV-2/FLU/RSV testing.  Fact Sheet for Patients: EntrepreneurPulse.com.au  Fact Sheet for Healthcare Providers: IncredibleEmployment.be  This test is not yet approved or cleared by the [place] FDA and has been authorized for detection and/or diagnosis of SARS-CoV-2 by FDA under an Emergency Use Authorization (EUA). This EUA will remain in effect (meaning this test can be used) for the duration of the COVID-19 declaration under Section 564(b)(1) of the Act, 21 U.S.C. section 360bbb-3(b)(1), unless the authorization is terminated or revoked.  Performed at [facility], [place] [address] [place], [place] [address]   Culture, blood (Routine X 2) w Reflex to ID Panel     Status: None (Preliminary result)   Collection Time: 08/09/2021 10:02 PM   Specimen: BLOOD  Result  Value Ref Range Status   Specimen Description   Final    BLOOD PORTA CATH Performed at [facility] [address], [place], [place] [address]    Special Requests   Final    BOTTLES DRAWN AEROBIC AND ANAEROBIC Blood Culture adequate volume Performed at [facility] [address], [place], [place] [address]    Culture   Final    NO GROWTH 3 DAYS Performed at [facility], [place] [address], [place], [place] [address]    Report Status PENDING  Incomplete  Culture, blood (Routine X 2) w Reflex to ID Panel     Status: None (Preliminary result)   Collection Time: 08/19/21  4:24 AM   Specimen: BLOOD  Result Value Ref Range Status   Specimen Description   Final    BLOOD PORTA CATH Performed at [facility] [address] [place], [place] [address]    Special Requests   Final    BOTTLES DRAWN AEROBIC AND ANAEROBIC Blood Culture results may not be optimal due to an excessive volume of blood received in culture bottles Performed at [facility] [address], [place], [place] [address]    Culture   Final    NO GROWTH 3 DAYS Performed at [facility], [place] [address], [place], [place] [address]    Report Status PENDING  Incomplete  MRSA Next Gen by PCR, Nasal     Status: None   Collection Time: 08/22/21  9:27 AM   Specimen: Nasal Mucosa; Nasal Swab  Result Value Ref Range Status   MRSA by PCR Next Gen NOT DETECTED NOT DETECTED Final    Comment: (NOTE) The GeneXpert MRSA Assay (FDA approved for NASAL specimens only), is one component of a comprehensive MRSA colonization surveillance program. It is not intended to diagnose MRSA infection nor to guide or monitor treatment for MRSA infections. Test performance is not FDA approved in patients less than 70 years old. Performed at [facility], [place] [address], [place],  [address]   Respiratory (~20 pathogens) panel by PCR      Status: None   Collection Time: 08/22/21  1:40 PM   Specimen: Nasopharyngeal Swab; Respiratory  Result Value Ref Range Status   Adenovirus NOT DETECTED NOT DETECTED Final   Coronavirus 229E NOT DETECTED NOT DETECTED  Final    Comment: (NOTE) The Coronavirus on the Respiratory Panel, DOES NOT test for the novel  Coronavirus (2019 nCoV)    Coronavirus HKU1 NOT DETECTED NOT DETECTED Final   Coronavirus NL63 NOT DETECTED NOT DETECTED Final   Coronavirus OC43 NOT DETECTED NOT DETECTED Final   Metapneumovirus NOT DETECTED NOT DETECTED Final   Rhinovirus / Enterovirus NOT DETECTED NOT DETECTED Final   Influenza A NOT DETECTED NOT DETECTED Final   Influenza B NOT DETECTED NOT DETECTED Final   Parainfluenza Virus 1 NOT DETECTED NOT DETECTED Final   Parainfluenza Virus 2 NOT DETECTED NOT DETECTED Final   Parainfluenza Virus 3 NOT DETECTED NOT DETECTED Final   Parainfluenza Virus 4 NOT DETECTED NOT DETECTED Final   Respiratory Syncytial Virus NOT DETECTED NOT DETECTED Final   Bordetella pertussis NOT DETECTED NOT DETECTED Final   Bordetella Parapertussis NOT DETECTED NOT DETECTED Final   Chlamydophila pneumoniae NOT DETECTED NOT DETECTED Final   Mycoplasma pneumoniae NOT DETECTED NOT DETECTED Final    Comment: Performed at [facility], [place] [address], [place], [place] [address]         Radiology Studies: DG CHEST PORT 1 VIEW  Result Date: 08/22/2021 CLINICAL DATA:  80 year old male with increase shortness of breath. 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 Small pleural effusion not feasible for thoracentesis on 08/19/2021. EXAM: PORTABLE CHEST 1 VIEW COMPARISON:  Chest CT 07/23/2021 and earlier. FINDINGS: Portable AP semi upright view at 0843 hours. Stable right chest Port-A-Cath, power port which is currently accessed. Mildly lower lung volumes. Progressive failing and confluent opacity in the right lower lung and about the hilum. Stable visible mediastinal contours. Left lung appears  stable. No pneumothorax or pulmonary edema. IMPRESSION: Progressive right airspace disease and/or pleural effusion since the CT on 08/05/2021. Repeat Chest Ultrasound for feasibility of thoracentesis now might be valuable. Lower lung volumes, left lung remains clear. Electronically Signed   By: Genevie Ann M.D.   On: 08/22/2021 08:57   ECHOCARDIOGRAM LIMITED  Result Date: 08/22/2021    ECHOCARDIOGRAM LIMITED REPORT   Patient Name:   Derek [name] Date of Exam: 08/22/2021 Medical Rec #:  [id number]   Height:       76.0 in Accession #:    [id number]  Weight:       267.9 lb Date of Birth:  January 06, 1941   BSA:          2.508 m Patient Age:    60 years    BP:           115/71 mmHg Patient Gender: M           HR:           80 bpm. Exam Location:  Inpatient Procedure: 2D Echo, Limited Echo and Intracardiac Opacification Agent Indications:    CHF  History:        Patient has prior history of Echocardiogram examinations. CAD,                 COPD; Risk Factors:Hypertension and Diabetes.  Sonographer:    Jyl Heinz Referring Phys: [id number] PAULA V ROSS IMPRESSIONS  1. Windows still challenging. With contrast, LV function is mildly reduced. Hypokinesis of the inferior, inferoseptal wall ; as well as anteroseptum as well. RV function is normal. IVC is normal and collapses RA pressure 3. FINDINGS  Left Ventricle: Windows still challenging. With contrast, LV function is mildly reduced. Hypokinesis of the inferior, inferoseptal wall ; as well as anteroseptum as  well. RV function is normal. IVC is normal and collapses RA pressure 3.  LV Volumes (MOD) LV vol d, MOD A2C: 125.0 ml LV vol d, MOD A4C: 127.0 ml LV vol s, MOD A2C: 53.0 ml LV vol s, MOD A4C: 53.3 ml LV SV MOD A2C:     72.0 ml LV SV MOD A4C:     127.0 ml LV SV MOD BP:      75.6 ml IVC IVC diam: 1.30 cm Phineas Inches Electronically signed by Phineas Inches Signature Date/Time: 08/22/2021/1:49:49 PM    Final    ECHOCARDIOGRAM LIMITED  Result Date: 08/21/2021    ECHOCARDIOGRAM LIMITED  REPORT   Patient Name:   Derek [name] Date of Exam: 08/21/2021 Medical Rec #:  [id number]   Height:       76.0 in Accession #:    [id number]  Weight:       272.0 lb Date of Birth:  August 16, 1941   BSA:          2.524 m Patient Age:    52 years    BP:           127/64 mmHg Patient Gender: M           HR:           83 bpm. Exam Location:  Inpatient Procedure: Limited Echo, Limited Color Doppler and Cardiac Doppler Indications:    CHF-Acute Diastolic Q65.78  History:        Patient has prior history of Echocardiogram examinations, most                 recent 06/07/2021. Cardiomyopathy; Risk Factors:Hypertension and                 Diabetes.  Sonographer:    Bernadene Person RDCS Referring Phys: [id number] Mohamud Mrozek IMPRESSIONS  1. Poor acoustic windows limt study LVEF may be mildly depressed REcomm limited echo with Defiinity to confirm wall motion Images suggestive that inferior/inferoseptal walls are hypokinetic . There is moderate left ventricular hypertrophy.  2. Right ventricular systolic function is low normal. The right ventricular size is mildly enlarged. There is normal pulmonary artery systolic pressure.  3. MR is eccentric, directed posterior into LA. Probable moderate . Moderate mitral valve regurgitation.  4. The aortic valve is tricuspid. Aortic valve regurgitation is not visualized. Aortic valve sclerosis/calcification is present, without any evidence of aortic stenosis.  5. Aortic dilatation noted. There is mild dilatation of the aortic root, measuring 41 mm. There is mild dilatation of the ascending aorta, measuring 38 mm.  6. The inferior vena cava is normal in size with greater than 50% respiratory variability, suggesting right atrial pressure of 3 mmHg. FINDINGS  Left Ventricle: Poor acoustic windows limt study LVEF may be mildly depressed REcomm limited echo with Defiinity to confirm wall motion Images suggestive that inferior/inferoseptal walls are hypokinetic. The left ventricular internal cavity size  was normal in size. There is moderate left ventricular hypertrophy. Right Ventricle: The right ventricular size is mildly enlarged. Right ventricular systolic function is low normal. There is normal pulmonary artery systolic pressure. The tricuspid regurgitant velocity is 2.38 m/s, and with an assumed right atrial pressure of 3 mmHg, the estimated right ventricular systolic pressure is 28.4 mmHg. Left Atrium: Left atrial size was normal in size. Right Atrium: Right atrial size was normal in size. Pericardium: There is no evidence of pericardial effusion. Mitral Valve: MR is eccentric, directed posterior into LA. Probable moderate. There is mild thickening  of the mitral valve leaflet(s). Moderate mitral valve regurgitation. Tricuspid Valve: The tricuspid valve is grossly normal. Tricuspid valve regurgitation is mild. Aortic Valve: The aortic valve is tricuspid. Aortic valve regurgitation is not visualized. Aortic valve sclerosis/calcification is present, without any evidence of aortic stenosis. Pulmonic Valve: The pulmonic valve was not well visualized. Pulmonic valve regurgitation is not visualized. Aorta: Aortic dilatation noted. There is mild dilatation of the aortic root, measuring 41 mm. There is mild dilatation of the ascending aorta, measuring 38 mm. Venous: The inferior vena cava is normal in size with greater than 50% respiratory variability, suggesting right atrial pressure of 3 mmHg. IAS/Shunts: The interatrial septum was not assessed. LEFT VENTRICLE PLAX 2D LVIDd:         4.50 cm     Diastology LVIDs:         2.60 cm     LV e' medial:    3.38 cm/s LV PW:         1.50 cm     LV E/e' medial:  19.7 LV IVS:        1.30 cm     LV e' lateral:   6.08 cm/s LVOT diam:     2.10 cm     LV E/e' lateral: 10.9 LV SV:         62 LV SV Index:   25 LVOT Area:     3.46 cm  LV Volumes (MOD) LV vol d, MOD A2C: 96.0 ml LV vol d, MOD A4C: 81.0 ml LV vol s, MOD A2C: 51.6 ml LV vol s, MOD A4C: 41.9 ml LV SV MOD A2C:     44.4 ml  LV SV MOD A4C:     81.0 ml LV SV MOD BP:      41.0 ml RIGHT VENTRICLE RV S prime:     13.50 cm/s TAPSE (M-mode): 2.5 cm LEFT ATRIUM           Index LA diam:      3.90 cm 1.54 cm/m LA Vol (A4C): 46.2 ml 18.30 ml/m  AORTIC VALVE LVOT Vmax:   103.00 cm/s LVOT Vmean:  69.400 cm/s LVOT VTI:    0.180 m  AORTA Ao Root diam: 4.10 cm Ao Asc diam:  3.80 cm MITRAL VALVE                  TRICUSPID VALVE MV Area (PHT): 3.46 cm       TR Peak grad:   22.7 mmHg MV Decel Time: 219 msec       TR Vmax:        238.00 cm/s MR Peak grad:    118.8 mmHg MR Mean grad:    81.0 mmHg    SHUNTS MR Vmax:         545.00 cm/s  Systemic VTI:  0.18 m MR Vmean:        423.0 cm/s   Systemic Diam: 2.10 cm MR PISA:         1.57 cm MR PISA Eff ROA: 11 mm MR PISA Radius:  0.50 cm MV E velocity: 66.50 cm/s MV A velocity: 48.00 cm/s MV E/A ratio:  1.39 Dorris Carnes MD Electronically signed by Dorris Carnes MD Signature Date/Time: 08/21/2021/4:03:52 PM    Final         Scheduled Meds:  (feeding supplement) PROSource Plus  30 mL Oral BID BM   Chlorhexidine Gluconate Cloth  6 each Topical Daily   feeding supplement  237 mL Oral BID BM  furosemide  80 mg Intravenous Q12H   levothyroxine  25 mcg Oral Q0600   mouth rinse  15 mL Mouth Rinse BID   metoprolol succinate  12.5 mg Oral Daily   mometasone-formoterol  2 puff Inhalation BID   multivitamin with minerals  1 tablet Oral Daily   rivaroxaban  20 mg Oral Q supper   rosuvastatin  10 mg Oral Daily   tamsulosin  0.4 mg Oral Daily   umeclidinium bromide  1 puff Inhalation Daily   Continuous Infusions:  sodium chloride Stopped (08/22/21 2319)   azithromycin Stopped (08/22/21 2300)   cefTRIAXone (ROCEPHIN)  IV Stopped (08/22/21 2212)     LOS: 5 days    Time spent:35 mins. More than 50% of that time was spent in counseling and/or coordination of care.      Shelly Coss, MD [facility] P12/01/2021, 7:22 AM

## 2021-08-23 NOTE — Progress Notes (Signed)
[facility] PCCM  Bedside ultrasound performed in his room There remains a small right pleural effusion, but in all areas I scanned I could only identify small pockets of fluid which were 2cm in depth.  It would be difficult to perform a thoracentesis safely considering the proximity of the lung to the chest wall.  Roselie Awkward, MD [place] [facility] Pager: [phone] Cell: [phone] After 7:00 pm call Elink  [phone]

## 2021-08-23 NOTE — Telephone Encounter (Signed)
Patient friend on DPR called and she wants on call person for Pulmonary to be in the loop. This a Dr. [name] patient and it shows he is off today.   FYI. There are CT results as well.

## 2021-08-23 NOTE — Procedures (Signed)
PROCEDURE SUMMARY:  Successful US guided right thoracentesis. Yielded 500 ml of light brown fluid. Pt tolerated procedure well. No immediate complications.  Specimen sent for labs. CXR ordered; no post-procedure pneumothorax identified  EBL < 2 mL  Theresa Duty, NP 08/23/2021 3:46 PM

## 2021-08-23 NOTE — Progress Notes (Signed)
[facility] Progress Note Patient Name: CARMERON HEADY DOB: July 15, 1941 MRN: [id number]   Date of Service  08/23/2021  HPI/Events of Note  Pt asking for Po pain med for C.O back pain,  adm with PNA, able to take PO's   eICU Interventions  AST/ALT normal. Cr elevated.  Tylenol oral once ordered.      Intervention Category Intermediate Interventions: Pain - evaluation and management  Elmer Sow 08/23/2021, 5:57 AM

## 2021-08-23 NOTE — Progress Notes (Addendum)
HEMATOLOGY-ONCOLOGY PROGRESS NOTE  I have seen the patient, and agree with documentation as follows  ASSESSMENT & PLAN:  Diffuse large B cell lymphoma (HCC) Overall, his performance status has declined and he has continued to have COPD exacerbation and dyspnea with minimal exertion Was seen by pulmonology who tried him on steroids with very minimal improvement Has been off steroids for about 2 weeks Due to ongoing infection and respiratory failure, he is not a candidate to receive treatment now Focus on supportive care -cannot proceed with any chemotherapy until respiratory status has stabilized I have a long discussion with the patient and his significant other today If his respiratory status does not improve, there will be no hope for him to return back on chemotherapy  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 and previous treatment We will proceed with IVIG treatment today I estimated 50% chance that it might help him clear lingering infection  Acute hypoxic respiratory failure secondary to possible pneumonia/right pleural effusion Has been started on IV antibiotics per hospitalist Was seen by IR on 12/1 for consideration of ultrasound-guided thoracentesis, but not enough fluid to remove He tested positive for COVID again IgG level is low and will plan for IVIG The risk, benefits, side effects of IVIG were discussed with the patient and he is in agreement to proceed Pulmonology following He was placed on BiPAP recently and high flow oxygen right now   Pancytopenia Unclear if due to lymphoma versus bone marrow suppression from acute infection He is afebrile at this time and will hold off on Granix No transfusion currently indicated I will order CBC with differential daily His WBC is improving and ANC has normalized He does not need PRBC or platelet transfusion  chronic kidney disease (CKD), 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HCC) Creatinine overall consistent with recent  baseline  We discussed the importance of risk factor modification and adequate hydration Avoid nephrotoxic medications  Paroxysmal atrial fibrillation/atrial flutter Xarelto currently on hold pending thoracentesis-okay to resume from our standpoint following procedure On metoprolol and Xarelto Amiodarone held due to concern for pulmonary toxicity  Discharge planning Not currently stable for discharge due to ongoing dyspnea and on 15 L O2 Currently being treated for pneumonia and IR to reevaluate for possible thoracentesis Pulmonology is following He would likely be here for the next 4 to 5 days  Goals of care discussion It is not clear to me that the patient will improve to the point he can benefit chemotherapy I am hopeful, if IVIG could help resolve lingering infection, he might improve in the future However, if IVIG is not helpful for him to resolve his pulmonary status, the patient and family needs to be prepared that he may eventually succumb to respiratory failure We discussed CODE STATUS and the patient would like to continue on full code His son, Nelma Rothman, is his healthcare and medical power of attorney  Artis Delay, MD  SUBJECTIVE: Transfer to the ICU over the weekend and has been seen by pulmonology and cardiology Continues to have shortness of breath Since last week, his pulmonary status did not improve He denies fevers and chills He is not having any chest pain, abdominal pain, nausea, vomiting  Oncology History Overview Note  Normal FISH for CLL DLBCL transformed from CLL; c-myc by FISH and other rearrangements were not detected   Diffuse large B cell lymphoma (HCC)  09/10/2013 Initial Diagnosis   CLL (chronic lymphocytic leukemia)   12/26/2013 Pathology Results   FISH analysis was normal.  06/17/2014 Procedure   The patient has placement of Infuse-a-Port.   06/18/2014 Imaging   CT scan of the chest, abdomen and pelvis showed diffuse lymphadenopathy and  splenomegaly.   06/19/2014 Bone Marrow Biopsy   Bone marrow aspirate and biopsy show CLL.   06/24/2014 - 11/18/2014 Chemotherapy   He received 6 cycles of Obinutuzumab and chlorambucil   09/15/2014 Imaging   Repeat CT scan of the chest, abdomen and pelvis show greater than 50% reduction in lymphadenopathy and splenomegaly   12/19/2014 Imaging   Repeat CT scan showed complete resolution of lymphadenopathy and splenomegaly.   04/22/2020 Imaging   CT neck 1. Cervical adenopathy involving the bilateral level 1B, right 2/3 and bilateral level 5 nodal stations. 2. Prominent to mildly enlarged upper mediastinal nodes. 3. Prominent subcentimeter right intraparotid node.     06/03/2020 -  Chemotherapy   The patient had rituximab for chemotherapy treatment.     08/31/2020 Imaging   CT neck 1. Regression of Leukemia. Resolved widespread cervical lymphadenopathy since August. Largest residual left level IIIb node now 8-9 mm short axis (previously 16 mm). 2.  CT Chest, Abdomen, and Pelvis today are reported separately.     08/31/2020 Imaging   Limited evaluation due to lack of intravenous contrast administration.   7.1 cm left perirenal soft tissue mass along the posterior left upper kidney, suspicious for perirenal lymphoma, although poorly evaluated. This is essentially new from May 2021.   Possible periureteral soft tissue along the right proximal collecting system/ureter, equivocal.   New 12 mm right lower lobe pulmonary nodule, suspicious for metastasis/pulmonary lymphoma. Additional 6 mm (mean diameter) subpleural nodule in the lingula is new from 2016, indeterminate.   No suspicious lymphadenopathy in the chest, abdomen, or pelvis. Spleen is normal in size.     02/05/2021 Imaging   1. Interval enlargement of multiple subcutaneous soft tissue nodules or lymph nodes, in the left axilla and overlying the sacrum and left buttock. 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 2.  Significant interval increase in size of right lower lobe and lingular pulmonary nodules, now with large subpleural opacities, consistent with worsened pulmonary lymphomatous involvement. 3. Interval decrease in size of a left perinephric soft tissue mass.  4. Constellation of findings above suggest mixed response to treatment. 5. 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 No clear evidence of honeycombing. 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 [place], Iss 5, [phone], May 20 2017. 6. Coronary artery disease.   02/05/2021 Pathology Results   A. 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  The appearance is diffuse with lack of atypical follicles.  Flow cytometric analysis was attempted ([id number]) but there were insufficient cells present in the sample for analysis. Hence a battery of immunohistochemical stains was performed and shows that the atypical lymphoid cells are positive for CD20, CD79a, PAX5, BCL-2, and MUM 1.  Only scattered cells are positive for cytoplasmic kappa and  not lambda.  There is patchy weak staining for BCL6.  No significant staining is seen with CD10, CD30, CD34, CD138, cyclin D1, or EBV in situ hybridization.  Ki-67 shows variable increased expression ranging from  30% to over 50% in some areas.  There is an admixed T-cell  population in the background to a lesser extent as seen with CD3 and CD5 and there is no apparent co-expression of CD5 in B-cell areas.  The findings are most consistent with involvement by large B-cell lymphoma,  ABC type.  Given the previous history of chronic lymphocytic leukemia/small lymphocytic lymphoma, this likely represents high-grade transformation.  Clinical correlation is recommended   02/26/2021 PET scan   1. Evidence of multi organ lymphoma recurrence. 2. Enlarging foci of hypermetabolic peripheral consolidation in both lungs consistent pulmonary lymphoma. 3. Multiple foci of hypermetabolic subcutaneous nodularity consistent with subcutaneous lymphoma. 4. Solitary hypermetabolic nodal metastasis in the central mesentery and peritoneal implant along the RIGHT iliacus muscle. 5. THree foci of metabolic activity in the skeleton consistent with skeletal lymphoma.     03/08/2021 Procedure   Status post CT-guided biopsy of right lower lobe lung mass   03/12/2021 Cancer Staging   Staging form: Hodgkin and Non-Hodgkin Lymphoma, AJCC 7th Edition - Clinical stage from 03/12/2021: Stage IV - Signed by [name], MD on 03/12/2021 Staged by: Managing physician Stage prefix: Recurrence Biopsy of metastatic site performed: Yes Source of metastatic specimen: Lung    03/17/2021 Echocardiogram    1. Left ventricular ejection fraction, by estimation, is 55 to 60%. The left ventricle has normal function. The left ventricle has no regional wall motion abnormalities. There is moderate concentric left ventricular hypertrophy. Left ventricular diastolic parameters are indeterminate. The average left ventricular global longitudinal strain is -20.4 %. The global longitudinal strain is normal.  2. Right ventricular systolic function is normal. The right ventricular size is normal. Tricuspid regurgitation signal is inadequate for assessing PA  pressure.  3. The mitral valve is grossly normal. Mild mitral valve regurgitation. No evidence of mitral stenosis.  4. The aortic valve is tricuspid. There is mild calcification of the aortic valve. Aortic valve regurgitation is not visualized. Mild aortic valve sclerosis is present, with no evidence of aortic valve stenosis.  5. The inferior vena cava is normal in size with greater than 50% respiratory variability, suggesting right atrial pressure of 3 mmHg.   03/19/2021 Procedure   Successful placement of a right internal jugular approach power injectable Port-A-Cath. The catheter is ready for immediate use.     03/29/2021 -  Chemotherapy   Patient is on Treatment Plan : NON-HODGKINS LYMPHOMA R-CHOP q21d     06/10/2021 Echocardiogram    1. Left ventricular ejection fraction, by estimation, is 55 to 60%. The left ventricle has normal function. The left ventricle has no regional wall motion abnormalities. There is mild left ventricular hypertrophy. Left ventricular diastolic parameters are indeterminate.  2. Right ventricular systolic function is normal. The right ventricular size is normal.  3. The mitral valve is normal in structure. Trivial mitral valve regurgitation.  4. The aortic valve is normal in structure. Aortic valve regurgitation is not visualized. No aortic stenosis is present.   06/14/2021 PET scan   1. Significant interval positive response to therapy. Residual hypermetabolic peripheral right lower lobe lung mass, decreased in size and metabolism. Additional previously visualized hypermetabolic sites of involvement in the skeleton, subcutaneous and muscular soft tissues and mesenteric lymph nodes have resolved. 2. New patchy ground-glass opacity with  associated patchy hypermetabolism throughout both lungs, nonspecific, differential includes drug reaction or atypical infection. Consider attention on follow-up chest CT. 3. Chronic findings include: Aortic Atherosclerosis (ICD10-I70.0).  Paranasal sinusitis. Right nephrolithiasis. Marked diffuse colonic diverticulosis. Coronary atherosclerosis. 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 Recommend annual imaging followup by CTA or MRA.      REVIEW OF SYSTEMS:   Eyes: Denies blurriness of vision Ears, nose, mouth, throat, and face: Denies mucositis or sore throat Respiratory: Reports cough and shortness of breath with minimal exertion Gastrointestinal:  Denies nausea, heartburn or change in bowel habits Skin: Denies abnormal skin rashes Lymphatics: Denies new lymphadenopathy or easy bruising Neurological:Denies numbness, tingling or new weaknesses Behavioral/Psych: Mood is stable, no new changes  Extremities: No lower extremity edema All other systems were reviewed with the patient and are negative.  I have reviewed the past medical history, past surgical history, social history and family history with the patient and they are unchanged from previous note.   PHYSICAL EXAMINATION: ECOG PERFORMANCE STATUS: 2 - Symptomatic, <50% confined to bed  Vitals:   08/23/21 0553 08/23/21 0600  BP:    Pulse: 84 75  Resp: 12 (!) 25  Temp:    SpO2: 94% 93%   Filed Weights   08/19/21 1526 08/22/21 1005  Weight: 123.4 kg 121.5 kg    Intake/Output from previous day: 12/04 0701 - 12/05 0700 In: 850.4 [P.O.:360; I.V.:140.4; IV Piggyback:350] Out: 650 [Urine:650]  GENERAL:alert, no distress and comfortable SKIN: skin color, texture, turgor are normal, no rashes or significant lesions LYMPH:  no palpable lymphadenopathy in the cervical, axillary or inguinal LUNGS: Diminished breath sounds HEART: regular rate & rhythm and no murmurs and no lower extremity edema ABDOMEN:abdomen soft, non-tender and normal bowel sounds NEURO: alert & oriented x 3 with fluent speech, no focal motor/sensory deficits  LABORATORY DATA:  I have reviewed the data as listed CMP Latest Ref Rng & Units 08/23/2021 08/22/2021 08/21/2021  Glucose 70 - 99  mg/dL 172(H) 220(H) 139(H)  BUN 8 - 23 mg/dL 55(H) 45(H) 41(H)  Creatinine 0.61 - 1.24 mg/dL 2.27(H) 2.05(H) 2.09(H)  Sodium 135 - 145 mmol/L 132(L) 132(L) 132(L)  Potassium 3.5 - 5.1 mmol/L 3.7 3.5 3.5  Chloride 98 - 111 mmol/L 98 99 101  CO2 22 - 32 mmol/L $RemoveB'23 23 24  'bcCPaMRD$ Calcium 8.9 - 10.3 mg/dL 8.1(L) 7.7(L) 7.7(L)  Total Protein 6.5 - 8.1 g/dL - - -  Total Bilirubin 0.3 - 1.2 mg/dL - - -  Alkaline Phos 38 - 126 U/L - - -  AST 15 - 41 U/L - - -  ALT 0 - 44 U/L - - -    Lab Results  Component Value Date   WBC 3.7 (L) 08/23/2021   HGB 11.0 (L) 08/23/2021   HCT 34.0 (L) 08/23/2021   MCV 83.1 08/23/2021   PLT 104 (L) 08/23/2021   NEUTROABS 2.2 08/23/2021   I have reviewed CT imaging and x-ray myself DG Chest 2 View  Result Date: 08/01/2021 CLINICAL DATA:  Shortness of breath over the last 3 months with productive cough and dyspnea with exertion. Non-Hodgkin's lymphoma. EXAM: CHEST - 2 VIEW COMPARISON:  CT chest 08/04/2021 and chest radiograph 08/17/2021 FINDINGS: Continued substantial consolidation in the right lower lobe, potentially from pneumonia or pulmonary lymphoma. Mild indistinct airspace opacity in the left lower lobe as well, with slight obscuration of left hemidiaphragm. Power injectable right Port-A-Cath tip: SVC. Atherosclerotic calcification of the aortic arch. Prominence of mediastinal and pericardial adipose tissues as  on recent CT. Trace fluid in the right lower major fissure. A component of pleural effusion on the right is not excluded. IMPRESSION: 1. Continued consolidation in the right lower lobe. The appearance could be from pneumonia or pulmonary involvement by lymphoma. 2. Mild left lower lobe airspace opacity, nonspecific, pneumonia not excluded. 3. At least a trace right pleural effusion with fluid along the lower portion of the major fissure. 4.  Aortic Atherosclerosis (ICD10-I70.0). Electronically Signed   By: Van Clines M.D.   On: 08/10/2021 14:33   CT  Chest Wo Contrast  Result Date: 08/13/2021 CLINICAL DATA:  Pneumonia.  Cough. EXAM: CT CHEST WITHOUT CONTRAST TECHNIQUE: Multidetector CT imaging of the chest was performed following the standard protocol without IV contrast. COMPARISON:  PET-CT 06/11/2021. FINDINGS: Cardiovascular: Heart and aorta are normal in size. There is no pericardial effusion. There are atherosclerotic calcifications of the aorta and coronary arteries. Right chest port catheter tip ends in the distal SVC. Superior vena cava is small in size, similar to the prior exam. Mediastinum/Nodes: The visualized thyroid gland is within normal limits. There is an enlarged subcarinal lymph node measuring 13 mm short axis similar to the prior study. Additional nonenlarged mediastinal lymph nodes are unchanged. Difficult to assess for right hilar adenopathy secondary to lack of contrast. Esophagus is unremarkable. Lungs/Pleura: There is a new moderate-sized right pleural effusion. There is some peripheral ill-defined ground-glass and solid nodular densities throughout the right lung measuring up to 8 mm. There is compressive atelectasis and small amount of airspace disease in the right lower lobe containing air bronchograms. There is some ground-glass and interstitial opacities in the left lower lobe is well. No pneumothorax. Trachea and central airways are patent. Upper Abdomen: Gallbladder surgically absent. Limited evaluation of the upper abdomen is otherwise within normal limits. Musculoskeletal: Multilevel degenerative changes affect the spine. No acute fractures are seen. IMPRESSION: 1. New moderate-sized right pleural effusion. 2. Right lower lobe atelectasis and airspace disease worrisome for infection. 3. New diffuse right lung peripheral solid and ground-glass nodular densities, indeterminate. Findings may be infectious/inflammatory or neoplastic. 4. Ground-glass and interstitial opacities in the left lung base favors as  infection/inflammation. 5. Stable enlarged subcarinal lymph node. Electronically Signed   By: Ronney Asters M.D.   On: 08/11/2021 18:43   CT CHEST WO CONTRAST  Result Date: 08/05/2021 CLINICAL DATA:  Shortness of breath for 2 months. History of CLL with chemo. Former smoker. EXAM: CT CHEST WITHOUT CONTRAST TECHNIQUE: Multidetector CT imaging of the chest was performed following the standard protocol without IV contrast. COMPARISON:  CT studies without contrast 02/05/2021 and 08/31/2020 FINDINGS: Cardiovascular: Borderline prominent heart. No pericardial effusion. Heavy three-vessel calcific CAD. Scattered calcifications in the aorta with ectatic ascending segment measuring 4.2 cm, unchanged. Upper limit of normal pulmonary trunk caliber. Mediastinum/Nodes: On the last CT there was a 1.5 cm soft tissue nodule anteriorly in the left axilla which is not seen today. There is a prominent posterior right hilar lymph node of 1.5 cm in short axis, previously 6 mm short axis. No other intrathoracic adenopathy is seen. Thyroid, trachea and esophagus are unremarkable without contrast. Lungs/Pleura: Mild features of subpleural fibrosis are again noted predominating in the lower zones, but present from the apices inferiorly. This has shown no progression. There is early subpleural microcystic honeycombing in both lateral bases. There is increased subpleural micronodular interstitial change in the anterolateral aspect left upper lobe, level of the aortic arch concerning for pneumonitis/bronchiolitis. There is interval enlargement of a  large pleural-based opacity with air bronchograms in the posterior basilar right lower lobe which is now 10.6 x 7.4 cm, previously 7.1 x 4.4 cm. The previously noted pleural-based mass in the lateral base of the lingula has improved however , now measuring 2.1 x 1.5 cm, previously 3.3 x 1.8 cm. There is new hazy interstitial change in the anterior basal left lower lobe concerning for  pneumonitis as well as peribronchovascular ground-glass and micronodular disease in the lateral aspect left lower lobe on series 8 axial images 87-90. Rest of the lungs are generally clear. There is no pleural effusion or pneumothorax. Musculoskeletal: There is osteopenia and degenerative changes of the spine. No suspicious chest wall lesions or destructive bone lesions. Right chest port with IJ approach catheter again noted with the tip in the SVC. Upper abdomen: 8 mm calculus again noted in the dependent right renal pelvis, with atrophic kidneys and moderate perinephric stranding as well as small nonobstructive caliceal stones in the right kidney. Soft tissue thickening posterior to left kidney now measures 3.7 x 1.4 cm with increased fatty lucency, previously 4.7 x 2.0 cm. IMPRESSION: 1. Enlarging area of consolidation with air bronchograms in the posterior basal right lower lobe but decreased size of the pre-existing lingular nodule and of a posterior left perinephric soft tissue mass. 2. Increased prominence of a single posterior right hilar lymph node of 1.6 cm short axis previously 0.6 cm without further adenopathy visible without contrast. 3. Areas of increased opacity in the left upper and lower lobes concerning for interval pneumonitis. Follow-up as indicated. 4. Basal-predominant subpleural fibrosis. 5. Coronary artery and aortic atherosclerosis. Borderline cardiomegaly. Electronically Signed   By: Telford Nab M.D.   On: 08/05/2021 02:15   [place] CHEST (PLEURAL EFFUSION)  Result Date: 08/19/2021 CLINICAL DATA:  Evaluate for thoracentesis. EXAM: CHEST ULTRASOUND COMPARISON:  Chest CT 07/20/2021 FINDINGS: Small amount of right pleural fluid but there is not a good percutaneous window. IMPRESSION: Small right pleural effusion. Thoracentesis not performed due to small volume and lack of safe percutaneous window. Electronically Signed   By: Markus Daft M.D.   On: 08/19/2021 13:40   DG CHEST PORT 1  VIEW  Result Date: 08/22/2021 CLINICAL DATA:  80 year old male with increase shortness of breath. 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 Small pleural effusion not feasible for thoracentesis on 08/19/2021. EXAM: PORTABLE CHEST 1 VIEW COMPARISON:  Chest CT 07/26/2021 and earlier. FINDINGS: Portable AP semi upright view at 0843 hours. Stable right chest Port-A-Cath, power port which is currently accessed. Mildly lower lung volumes. Progressive failing and confluent opacity in the right lower lung and about the hilum. Stable visible mediastinal contours. Left lung appears stable. No pneumothorax or pulmonary edema. IMPRESSION: Progressive right airspace disease and/or pleural effusion since the CT on 08/08/2021. Repeat Chest Ultrasound for feasibility of thoracentesis now might be valuable. Lower lung volumes, left lung remains clear. Electronically Signed   By: Genevie Ann M.D.   On: 08/22/2021 08:57   VAS [place] LOWER EXTREMITY VENOUS (DVT) (7a-7p)  Result Date: 08/19/2021  Lower Venous DVT Study Patient Name:  Derek [name]  Date of Exam:   07/25/2021 Medical Rec #: [id number]    Accession #:    [id number] Date of Birth: July 23, 1941    Patient Gender: M Patient Age:   20 years Exam Location:  [facility] Procedure:      VAS [place] LOWER EXTREMITY VENOUS (DVT) Referring Phys: Fredia Sorrow --------------------------------------------------------------------------------  Indications: Edema, and Palpable Cord.  Comparison Study:  No prior study Performing Technologist: Maudry Mayhew MHA, RDMS, RVT, RDCS  Examination Guidelines: A complete evaluation includes B-mode imaging, spectral Doppler, color Doppler, and power Doppler as needed of all accessible portions of each vessel. Bilateral testing is considered an integral part of a complete examination. Limited examinations for reoccurring indications may be performed as noted. The reflux portion of the exam is performed with the patient in reverse Trendelenburg.   +-----+---------------+---------+-----------+----------+--------------+ RIGHTCompressibilityPhasicitySpontaneityPropertiesThrombus Aging +-----+---------------+---------+-----------+----------+--------------+ CFV  Full           Yes      Yes                                 +-----+---------------+---------+-----------+----------+--------------+   +---------+---------------+---------+-----------+----------+--------------+ LEFT     CompressibilityPhasicitySpontaneityPropertiesThrombus Aging +---------+---------------+---------+-----------+----------+--------------+ CFV      Full           Yes      Yes                                 +---------+---------------+---------+-----------+----------+--------------+ SFJ      Full                                                        +---------+---------------+---------+-----------+----------+--------------+ FV Prox  Full                                                        +---------+---------------+---------+-----------+----------+--------------+ FV Mid   Full                                                        +---------+---------------+---------+-----------+----------+--------------+ FV DistalFull                                                        +---------+---------------+---------+-----------+----------+--------------+ PFV      Full                                                        +---------+---------------+---------+-----------+----------+--------------+ POP      Full           Yes      Yes                                 +---------+---------------+---------+-----------+----------+--------------+ PTV      Full                                                        +---------+---------------+---------+-----------+----------+--------------+  PERO     Full                                                         +---------+---------------+---------+-----------+----------+--------------+ Gastroc  Full                                                        +---------+---------------+---------+-----------+----------+--------------+     Summary: RIGHT: - No evidence of common femoral vein obstruction.  LEFT: - There is no evidence of deep vein thrombosis in the lower extremity.  - No cystic structure found in the popliteal fossa.  *See table(s) above for measurements and observations. Electronically signed by Monica Martinez MD on 08/19/2021 at 5:23:22 PM.    Final    ECHOCARDIOGRAM LIMITED  Result Date: 08/22/2021    ECHOCARDIOGRAM LIMITED REPORT   Patient Name:   KAICEN DESENA Date of Exam: 08/22/2021 Medical Rec #:  [id number]   Height:       76.0 in Accession #:    [id number]  Weight:       267.9 lb Date of Birth:  [birth year]   BSA:          2.508 m Patient Age:    24 years    BP:           115/71 mmHg Patient Gender: M           HR:           80 bpm. Exam Location:  Inpatient Procedure: 2D Echo, Limited Echo and Intracardiac Opacification Agent Indications:    CHF  History:        Patient has prior history of Echocardiogram examinations. CAD,                 COPD; Risk Factors:Hypertension and Diabetes.  Sonographer:    Jyl Heinz Referring Phys: [id number] PAULA V ROSS IMPRESSIONS  1. Windows still challenging. With contrast, LV function is mildly reduced. Hypokinesis of the inferior, inferoseptal wall ; as well as anteroseptum as well. RV function is normal. IVC is normal and collapses RA pressure 3. FINDINGS  Left Ventricle: Windows still challenging. With contrast, LV function is mildly reduced. Hypokinesis of the inferior, inferoseptal wall ; as well as anteroseptum as well. RV function is normal. IVC is normal and collapses RA pressure 3.  LV Volumes (MOD) LV vol d, MOD A2C: 125.0 ml LV vol d, MOD A4C: 127.0 ml LV vol s, MOD A2C: 53.0 ml LV vol s, MOD A4C: 53.3 ml LV SV MOD A2C:     72.0 ml LV SV MOD A4C:      127.0 ml LV SV MOD BP:      75.6 ml IVC IVC diam: 1.30 cm Phineas Inches Electronically signed by Phineas Inches Signature Date/Time: 08/22/2021/1:49:49 PM    Final    ECHOCARDIOGRAM LIMITED  Result Date: 08/21/2021    ECHOCARDIOGRAM LIMITED REPORT   Patient Name:   CAYLIN RABY Date of Exam: 08/21/2021 Medical Rec #:  [id number]   Height:       76.0 in Accession #:    [id number]  Weight:  272.0 lb Date of Birth:  1940/10/31   BSA:          2.524 m Patient Age:    43 years    BP:           127/64 mmHg Patient Gender: M           HR:           83 bpm. Exam Location:  Inpatient Procedure: Limited Echo, Limited Color Doppler and Cardiac Doppler Indications:    CHF-Acute Diastolic I50.31  History:        Patient has prior history of Echocardiogram examinations, most                 recent 06/07/2021. Cardiomyopathy; Risk Factors:Hypertension and                 Diabetes.  Sonographer:    Eulah Pont RDCS Referring Phys: [id number] AMRIT ADHIKARI IMPRESSIONS  1. Poor acoustic windows limt study LVEF may be mildly depressed REcomm limited echo with Defiinity to confirm wall motion Images suggestive that inferior/inferoseptal walls are hypokinetic . There is moderate left ventricular hypertrophy.  2. Right ventricular systolic function is low normal. The right ventricular size is mildly enlarged. There is normal pulmonary artery systolic pressure.  3. MR is eccentric, directed posterior into LA. Probable moderate . Moderate mitral valve regurgitation.  4. The aortic valve is tricuspid. Aortic valve regurgitation is not visualized. Aortic valve sclerosis/calcification is present, without any evidence of aortic stenosis.  5. Aortic dilatation noted. There is mild dilatation of the aortic root, measuring 41 mm. There is mild dilatation of the ascending aorta, measuring 38 mm.  6. The inferior vena cava is normal in size with greater than 50% respiratory variability, suggesting right atrial pressure of 3 mmHg. FINDINGS  Left  Ventricle: Poor acoustic windows limt study LVEF may be mildly depressed REcomm limited echo with Defiinity to confirm wall motion Images suggestive that inferior/inferoseptal walls are hypokinetic. The left ventricular internal cavity size was normal in size. There is moderate left ventricular hypertrophy. Right Ventricle: The right ventricular size is mildly enlarged. Right ventricular systolic function is low normal. There is normal pulmonary artery systolic pressure. The tricuspid regurgitant velocity is 2.38 m/s, and with an assumed right atrial pressure of 3 mmHg, the estimated right ventricular systolic pressure is 25.7 mmHg. Left Atrium: Left atrial size was normal in size. Right Atrium: Right atrial size was normal in size. Pericardium: There is no evidence of pericardial effusion. Mitral Valve: MR is eccentric, directed posterior into LA. Probable moderate. There is mild thickening of the mitral valve leaflet(s). Moderate mitral valve regurgitation. Tricuspid Valve: The tricuspid valve is grossly normal. Tricuspid valve regurgitation is mild. Aortic Valve: The aortic valve is tricuspid. Aortic valve regurgitation is not visualized. Aortic valve sclerosis/calcification is present, without any evidence of aortic stenosis. Pulmonic Valve: The pulmonic valve was not well visualized. Pulmonic valve regurgitation is not visualized. Aorta: Aortic dilatation noted. There is mild dilatation of the aortic root, measuring 41 mm. There is mild dilatation of the ascending aorta, measuring 38 mm. Venous: The inferior vena cava is normal in size with greater than 50% respiratory variability, suggesting right atrial pressure of 3 mmHg. IAS/Shunts: The interatrial septum was not assessed. LEFT VENTRICLE PLAX 2D LVIDd:         4.50 cm     Diastology LVIDs:         2.60 cm     LV e' medial:  3.38 cm/s LV PW:         1.50 cm     LV E/e' medial:  19.7 LV IVS:        1.30 cm     LV e' lateral:   6.08 cm/s LVOT diam:     2.10  cm     LV E/e' lateral: 10.9 LV SV:         62 LV SV Index:   25 LVOT Area:     3.46 cm  LV Volumes (MOD) LV vol d, MOD A2C: 96.0 ml LV vol d, MOD A4C: 81.0 ml LV vol s, MOD A2C: 51.6 ml LV vol s, MOD A4C: 41.9 ml LV SV MOD A2C:     44.4 ml LV SV MOD A4C:     81.0 ml LV SV MOD BP:      41.0 ml RIGHT VENTRICLE RV S prime:     13.50 cm/s TAPSE (M-mode): 2.5 cm LEFT ATRIUM           Index LA diam:      3.90 cm 1.54 cm/m LA Vol (A4C): 46.2 ml 18.30 ml/m  AORTIC VALVE LVOT Vmax:   103.00 cm/s LVOT Vmean:  69.400 cm/s LVOT VTI:    0.180 m  AORTA Ao Root diam: 4.10 cm Ao Asc diam:  3.80 cm MITRAL VALVE                  TRICUSPID VALVE MV Area (PHT): 3.46 cm       TR Peak grad:   22.7 mmHg MV Decel Time: 219 msec       TR Vmax:        238.00 cm/s MR Peak grad:    118.8 mmHg MR Mean grad:    81.0 mmHg    SHUNTS MR Vmax:         545.00 cm/s  Systemic VTI:  0.18 m MR Vmean:        423.0 cm/s   Systemic Diam: 2.10 cm MR PISA:         1.57 cm MR PISA Eff ROA: 11 mm MR PISA Radius:  0.50 cm MV E velocity: 66.50 cm/s MV A velocity: 48.00 cm/s MV E/A ratio:  1.39 Dorris Carnes MD Electronically signed by Dorris Carnes MD Signature Date/Time: 08/21/2021/4:03:52 PM    Final

## 2021-08-23 NOTE — Evaluation (Signed)
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  CT chest at admission shows new onset moderate right pleural effusion, positive for Covid.  Clinical Impression  The patient   found seated on bed edge, asking  to get OOB. Patient  did require 2 person to stand from bed and recliner, assist to power up, Able to step  from bed to recliner.  Patient  presents with moderate weakness. Patient reports that he lives alone.Patient may benefit from short rehab post acute.   Patient on 15 L HFNC. SPO2 dropped to 84% when mobilizing to recliner. Spo2 returned to 90$% once settled in recliner.  Pt admitted with above diagnosis.  Pt currently with functional limitations due to the deficits listed below (see PT Problem List). Pt will benefit from skilled PT to increase their independence and safety with mobility to allow discharge to the venue listed below.         Recommendations for follow up therapy are one component of a multi-disciplinary discharge planning process, led by the attending physician.  Recommendations may be updated based on patient status, additional functional criteria and insurance authorization.  Follow Up Recommendations Skilled nursing-short term rehab (<3 hours/day)    Assistance Recommended at Discharge Frequent or constant Supervision/Assistance  Functional Status Assessment Patient has had a recent decline in their functional status and demonstrates the ability to make significant improvements in function in a reasonable and predictable amount of time.   Equipment Recommendations  None recommended by PT    Recommendations for Other Services       Precautions / Restrictions Precautions Precautions: Fall Precaution Comments: in 15 L HFNC      Mobility  Bed Mobility Overal bed mobility: Needs Assistance Bed Mobility: Supine to Sit     Supine to sit: Min assist     General bed mobility comments: patient initially sitting on edge of bed, bed rail up. patient returned to supine   to roll to other side, then required min assist to sit upright from flat surface.    Transfers Overall transfer level: Needs assistance Equipment used: 2 person hand held assist Transfers: Sit to/from Stand;Bed to chair/wheelchair/BSC Sit to Stand: Mod assist;+2 physical assistance;+2 safety/equipment;From elevated surface   Step pivot transfers: +2 safety/equipment;+2 physical assistance;Mod assist       General transfer comment: Patient required 2 persons to power up to stand from bed anf then from recliner. Pivot steps with HHA to recliner.    Ambulation/Gait               General Gait Details: NT  Stairs            Wheelchair Mobility    Modified Rankin (Stroke Patients Only)       Balance Overall balance assessment: Needs assistance Sitting-balance support: Bilateral upper extremity supported;Feet supported Sitting balance-Leahy Scale: Fair     Standing balance support: During functional activity;Bilateral upper extremity supported;Reliant on assistive device for balance Standing balance-Leahy Scale: Poor  Pertinent Vitals/Pain Pain Assessment: Faces Faces Pain Scale: Hurts little more Pain Location: back Pain Descriptors / Indicators: Discomfort;Grimacing Pain Intervention(s): Monitored during session    Home Living Family/patient expects to be discharged to:: Private residence Living Arrangements: Alone Available Help at Discharge: Family Type of Home: House Home  Access: Stairs to enter Entrance Stairs-Rails: Can reach both Entrance Stairs-Number of Steps: 5   Home Layout: One level Home Equipment: Cane - single point      Prior Function Prior Level of Function : Independent/Modified Independent               ADLs Comments: patient reported doing IADLs as well. still driving     Hand Dominance        Extremity/Trunk Assessment   Upper Extremity Assessment Upper Extremity Assessment: Defer to OT evaluation    Lower Extremity Assessment Lower Extremity Assessment: Generalized weakness    Cervical / Trunk Assessment Cervical / Trunk Assessment: Normal  Communication   Communication: No difficulties  Cognition Arousal/Alertness: Awake/alert Behavior During Therapy: WFL for tasks assessed/performed Overall Cognitive Status: No family/caregiver present to determine baseline cognitive functioning                                 General Comments: patient was oriented to place, person and situation. patient was oriented to date during session        General Comments      Exercises     Assessment/Plan    PT Assessment Patient needs continued PT services  PT Problem List Decreased strength;Decreased mobility;Cardiopulmonary status limiting activity;Decreased activity tolerance;Decreased knowledge of use of DME       PT Treatment Interventions DME instruction;Therapeutic activities;Gait training;Therapeutic exercise;Patient/family education;Functional mobility training    PT Goals (Current goals can be found in the Care Plan section)  Acute Rehab PT Goals Patient Stated Goal: to get out of bed PT Goal Formulation: With patient Time For Goal Achievement: 09/06/21 Potential to Achieve Goals: Fair    Frequency Min 3X/week   Barriers to discharge Decreased caregiver support      Co-evaluation PT/OT/SLP Co-Evaluation/Treatment: Yes Reason for Co-Treatment: For patient/therapist safety;Complexity of the  patient's impairments (multi-system involvement) PT goals addressed during session: Mobility/safety with mobility OT goals addressed during session: ADL's and self-care       AM-PAC PT "6 Clicks" Mobility  Outcome Measure Help needed turning from your back to your side while in a flat bed without using bedrails?: A Lot Help needed moving from lying on your back to sitting on the side of a flat bed without using bedrails?: A Lot Help needed moving to and from a bed to a chair (including a wheelchair)?: A Lot Help needed standing up from a chair using your arms (e.g., wheelchair or bedside chair)?: A Lot Help needed to walk in hospital room?: Total Help needed climbing 3-5 steps with a railing? : Total 6 Click Score: 10    End of Session Equipment Utilized During Treatment: Gait belt Activity Tolerance: Treatment limited secondary to medical complications (Comment) Patient left: in chair;with call bell/phone within reach;with chair alarm set Nurse Communication: Mobility status PT Visit Diagnosis: Unsteadiness on feet (R26.81);Difficulty in walking, not elsewhere classified (R26.2)    Time: 6440-3474 PT Time Calculation (min) (ACUTE ONLY): 24 min   Charges:   PT Evaluation $PT Eval Low Complexity: 1 Low          Tresa Endo PT Acute  Rehabilitation Services Pager [phone] Office [phone]   Claretha Cooper 08/23/2021, 10:52 AM

## 2021-08-23 NOTE — Plan of Care (Signed)
Discussed with patient plan of care for the evening, pain management and Cpap with some teach back displayed.  Problem: Education: Goal: Knowledge of General Education information will improve Description: Including pain rating scale, medication(s)/side effects and non-pharmacologic comfort measures Outcome: Progressing

## 2021-08-24 ENCOUNTER — Ambulatory Visit: Payer: Medicare Other | Admitting: Hematology and Oncology

## 2021-08-24 ENCOUNTER — Other Ambulatory Visit: Payer: Medicare Other

## 2021-08-24 DIAGNOSIS — J189 Pneumonia, unspecified organism: Secondary | ICD-10-CM | POA: Diagnosis not present

## 2021-08-24 DIAGNOSIS — D801 Nonfamilial hypogammaglobulinemia: Secondary | ICD-10-CM | POA: Diagnosis not present

## 2021-08-24 DIAGNOSIS — N184 Chronic kidney disease, stage 4 (severe): Secondary | ICD-10-CM | POA: Diagnosis not present

## 2021-08-24 DIAGNOSIS — J9601 Acute respiratory failure with hypoxia: Secondary | ICD-10-CM | POA: Diagnosis not present

## 2021-08-24 DIAGNOSIS — J449 Chronic obstructive pulmonary disease, unspecified: Secondary | ICD-10-CM | POA: Diagnosis not present

## 2021-08-24 DIAGNOSIS — C8330 Diffuse large B-cell lymphoma, unspecified site: Secondary | ICD-10-CM | POA: Diagnosis not present

## 2021-08-24 LAB — BASIC METABOLIC PANEL
Anion gap: 12 (ref 5–15)
Anion gap: 15 (ref 5–15)
BUN: 64 mg/dL — ABNORMAL HIGH (ref 8–23)
BUN: 78 mg/dL — ABNORMAL HIGH (ref 8–23)
CO2: 23 mmol/L (ref 22–32)
CO2: 21 mmol/L — ABNORMAL LOW (ref 22–32)
Calcium: 8 mg/dL — ABNORMAL LOW (ref 8.9–10.3)
Calcium: 8.1 mg/dL — ABNORMAL LOW (ref 8.9–10.3)
Chloride: 92 mmol/L — ABNORMAL LOW (ref 98–111)
Chloride: 92 mmol/L — ABNORMAL LOW (ref 98–111)
Creatinine, Ser: 2.55 mg/dL — ABNORMAL HIGH (ref 0.61–1.24)
Creatinine, Ser: 3.05 mg/dL — ABNORMAL HIGH (ref 0.61–1.24)
GFR, Estimated: 25 mL/min — ABNORMAL LOW (ref >60)
GFR, Estimated: 20 mL/min — ABNORMAL LOW (ref >60)
Glucose, Bld: 391 mg/dL — ABNORMAL HIGH (ref 70–99)
Glucose, Bld: 479 mg/dL — ABNORMAL HIGH (ref 70–99)
Potassium: 4.3 mmol/L (ref 3.5–5.1)
Potassium: 4.5 mmol/L (ref 3.5–5.1)
Sodium: 127 mmol/L — ABNORMAL LOW (ref 135–145)
Sodium: 128 mmol/L — ABNORMAL LOW (ref 135–145)

## 2021-08-24 LAB — ANTI-DNA ANTIBODY, DOUBLE-STRANDED: ds DNA Ab: <1 IU/mL (ref 0–9)

## 2021-08-24 LAB — SJOGRENS SYNDROME-B EXTRACTABLE NUCLEAR ANTIBODY: SSB (La) (ENA) Antibody, IgG: <0.2 AI (ref 0.0–0.9)

## 2021-08-24 LAB — CBC WITH DIFFERENTIAL/PLATELET
Abs Immature Granulocytes: 0.17 10*3/uL — ABNORMAL HIGH (ref 0.00–0.07)
Basophils Absolute: 0 10*3/uL (ref 0.0–0.1)
Basophils Relative: 1 %
Eosinophils Absolute: 0 10*3/uL (ref 0.0–0.5)
Eosinophils Relative: 0 %
HCT: 31.7 % — ABNORMAL LOW (ref 39.0–52.0)
Hemoglobin: 10.2 g/dL — ABNORMAL LOW (ref 13.0–17.0)
Immature Granulocytes: 3 %
Lymphocytes Relative: 6 %
Lymphs Abs: 0.3 10*3/uL — ABNORMAL LOW (ref 0.7–4.0)
MCH: 27.1 pg (ref 26.0–34.0)
MCHC: 32.2 g/dL (ref 30.0–36.0)
MCV: 84.1 fL (ref 80.0–100.0)
Monocytes Absolute: 0.3 10*3/uL (ref 0.1–1.0)
Monocytes Relative: 5 %
Neutro Abs: 4.5 10*3/uL (ref 1.7–7.7)
Neutrophils Relative %: 85 %
Platelets: 93 10*3/uL — ABNORMAL LOW (ref 150–400)
RBC: 3.77 MIL/uL — ABNORMAL LOW (ref 4.22–5.81)
RDW: 16.5 % — ABNORMAL HIGH (ref 11.5–15.5)
WBC: 5.3 10*3/uL (ref 4.0–10.5)
nRBC: 0 % (ref 0.0–0.2)

## 2021-08-24 LAB — PROCALCITONIN: Procalcitonin: 0.43 ng/mL

## 2021-08-24 LAB — RHEUMATOID FACTOR: Rheumatoid fact SerPl-aCnc: 10.6 IU/mL (ref <14.0)

## 2021-08-24 LAB — CULTURE, BLOOD (ROUTINE X 2)
Culture: NO GROWTH
Culture: NO GROWTH
Special Requests: ADEQUATE

## 2021-08-24 LAB — CYCLIC CITRUL PEPTIDE ANTIBODY, IGG/IGA: CCP Antibodies IgG/IgA: <1 units (ref 0–19)

## 2021-08-24 LAB — GLUCOSE, CAPILLARY
Glucose-Capillary: 410 mg/dL — ABNORMAL HIGH (ref 70–99)
Glucose-Capillary: 409 mg/dL — ABNORMAL HIGH (ref 70–99)

## 2021-08-24 LAB — SJOGRENS SYNDROME-A EXTRACTABLE NUCLEAR ANTIBODY: SSA (Ro) (ENA) Antibody, IgG: <0.2 AI (ref 0.0–0.9)

## 2021-08-24 LAB — GLOMERULAR BASEMENT MEMBRANE ANTIBODIES: GBM Ab: <0.2 units (ref 0.0–0.9)

## 2021-08-24 MED ORDER — INSULIN ASPART 100 UNIT/ML IJ SOLN
0.0000 [IU] | Freq: Every day | INTRAMUSCULAR | Status: DC
Start: 2021-08-24 — End: 2021-08-26
  Administered 2021-08-24: 5 [IU] via SUBCUTANEOUS
  Administered 2021-08-25: 2 [IU] via SUBCUTANEOUS

## 2021-08-24 MED ORDER — BISACODYL 10 MG RE SUPP
10.0000 mg | Freq: Once | RECTAL | Status: DC
  Filled 2021-08-24: qty 1

## 2021-08-24 MED ORDER — SODIUM CHLORIDE 0.9 % IV SOLN
2.0000 g | INTRAVENOUS | Status: AC
  Administered 2021-08-24: 2 g via INTRAVENOUS
  Filled 2021-08-24: qty 20

## 2021-08-24 MED ORDER — INSULIN ASPART 100 UNIT/ML IJ SOLN: 0.0000 [IU] | Freq: Three times a day (TID) | INTRAMUSCULAR | Status: DC

## 2021-08-24 MED ORDER — SODIUM CHLORIDE 0.9 % IV SOLN: 500.0000 mg | INTRAVENOUS | Status: DC

## 2021-08-24 MED ORDER — INSULIN GLARGINE-YFGN 100 UNIT/ML ~~LOC~~ SOLN
20.0000 [IU] | Freq: Every day | SUBCUTANEOUS | Status: DC
  Administered 2021-08-24 – 2021-08-25 (×2): 20 [IU] via SUBCUTANEOUS
  Filled 2021-08-24 (×2): qty 0.2

## 2021-08-24 MED ORDER — INSULIN ASPART 100 UNIT/ML IJ SOLN
10.0000 [IU] | Freq: Once | INTRAMUSCULAR | Status: AC
  Administered 2021-08-24: 10 [IU] via INTRAVENOUS

## 2021-08-24 NOTE — Progress Notes (Addendum)
HEMATOLOGY-ONCOLOGY PROGRESS NOTE  I have seen the patient and agree with documentation as follows ASSESSMENT & PLAN:  Diffuse large B cell lymphoma ([place]) Overall, his performance status has declined and he has continued to have COPD exacerbation and dyspnea with minimal exertion Due to ongoing infection and respiratory failure, he is not a candidate to receive treatment now Focus on supportive care -cannot proceed with any chemotherapy until respiratory status has stabilized I have a long discussion with the patient and his significant other  If his respiratory status does not improve, there will be no hope for him to return back on chemotherapy  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 and previous treatment Status post IVIG on 12/5 I estimated 50% chance that it might help him clear lingering infection  Acute hypoxic respiratory failure secondary to possible pneumonia/right pleural effusion Has been started on IV antibiotics per hospitalist Was seen by IR on 12/1 for consideration of ultrasound-guided thoracentesis, but not enough fluid to remove He tested positive for COVID again IgG level is low and he received IVIG on 12/5 Pulmonology following He was placed on BiPAP recently and high flow oxygen right now Status post thoracentesis on 12/5 with very minimal improvement in his breathing He is receiving IV Solu-Medrol; his respiratory status appears to have improved with high-dose Solu-Medrol I am hopeful that would improve his pulmonary status and get him out of the ICU   Pancytopenia Unclear if due to lymphoma versus bone marrow suppression from acute infection He is afebrile at this time and will hold off on Granix No transfusion currently indicated I will order CBC with differential daily WBC is now normal; hemoglobin and platelets down slightly but no need for transfusion  chronic kidney disease (CKD), 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HCC) Creatinine overall  consistent with recent baseline  We discussed the importance of risk factor modification and adequate hydration Avoid nephrotoxic medications  Paroxysmal atrial fibrillation/atrial flutter On metoprolol and Xarelto Amiodarone held due to concern for pulmonary toxicity Cardiology following  Discharge planning Not currently stable for discharge due to ongoing dyspnea and on 13 L O2 Currently being treated for pneumonia  Pulmonology is following He would likely be here for the next 4 to 5 days  Goals of care discussion It is not clear to me that the patient will improve to the point he can benefit chemotherapy I am hopeful, if IVIG could help resolve lingering infection, he might improve in the future However, if IVIG is not helpful for him to resolve his pulmonary status, the patient and family needs to be prepared that he may eventually succumb to respiratory failure He has agreed to DNR/DNI His son, Coralyn Pear, is his healthcare and medical power of attorney  SUBJECTIVE: Continues to have shortness of breath but overall slightly improved On 13 L of O2 this morning He denies fevers and chills He is not having any chest pain, abdominal pain, nausea, vomiting  Oncology History Overview Note  Normal FISH for CLL DLBCL transformed from CLL; c-myc by [facility] and other rearrangements were not detected   Diffuse large B cell lymphoma ([place])  09/10/2013 Initial Diagnosis   CLL (chronic lymphocytic leukemia)   12/26/2013 Pathology Results   FISH analysis was normal.   06/17/2014 Procedure   The patient has placement of Infuse-a-Port.   06/18/2014 Imaging   CT scan of the chest, abdomen and pelvis showed diffuse lymphadenopathy and splenomegaly.   06/19/2014 Bone Marrow Biopsy   Bone marrow aspirate and biopsy show CLL.  06/24/2014 - 11/18/2014 Chemotherapy   He received 6 cycles of Obinutuzumab and chlorambucil   09/15/2014 Imaging   Repeat CT scan of the chest, abdomen and pelvis show  greater than 50% reduction in lymphadenopathy and splenomegaly   12/19/2014 Imaging   Repeat CT scan showed complete resolution of lymphadenopathy and splenomegaly.   04/22/2020 Imaging   CT neck 1. Cervical adenopathy involving the bilateral level 1B, right 2/3 and bilateral level 5 nodal stations. 2. Prominent to mildly enlarged upper mediastinal nodes. 3. Prominent subcentimeter right intraparotid node.     06/03/2020 -  Chemotherapy   The patient had rituximab for chemotherapy treatment.     08/31/2020 Imaging   CT neck 1. Regression of Leukemia. Resolved widespread cervical lymphadenopathy since August. Largest residual left level IIIb node now 8-9 mm short axis (previously 16 mm). 2.  CT Chest, Abdomen, and Pelvis today are reported separately.     08/31/2020 Imaging   Limited evaluation due to lack of intravenous contrast administration.   7.1 cm left perirenal soft tissue mass along the posterior left upper kidney, suspicious for perirenal lymphoma, although poorly evaluated. This is essentially new from May 2021.   Possible periureteral soft tissue along the right proximal collecting system/ureter, equivocal.   New 12 mm right lower lobe pulmonary nodule, suspicious for metastasis/pulmonary lymphoma. Additional 6 mm (mean diameter) subpleural nodule in the lingula is new from 2016, indeterminate.   No suspicious lymphadenopathy in the chest, abdomen, or pelvis. Spleen is normal in size.     02/05/2021 Imaging   1. Interval enlargement of multiple subcutaneous soft tissue nodules or lymph nodes, in the left axilla and overlying the sacrum and left buttock. 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 2. Significant interval increase in size of right lower lobe and lingular pulmonary nodules, now with large subpleural opacities, consistent with worsened pulmonary lymphomatous involvement. 3. Interval decrease in size of a left perinephric soft tissue mass.  4.  Constellation of findings above suggest mixed response to treatment. 5. 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 No clear evidence of honeycombing. 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 [place], Iss 5, [phone], May 20 2017. 6. Coronary artery disease.   02/05/2021 Pathology Results   A. 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  The appearance is diffuse with lack of atypical follicles.  Flow cytometric analysis was attempted ([id number]) but there were insufficient cells present in the sample for analysis. Hence a battery of immunohistochemical stains was performed and shows that the atypical lymphoid cells are positive for CD20, CD79a, PAX5, BCL-2, and MUM 1.  Only scattered cells are positive for cytoplasmic kappa and not lambda.  There is patchy weak staining for BCL6.  No significant staining is seen with CD10, CD30, CD34, CD138, cyclin D1, or EBV in situ hybridization.  Ki-67 shows variable increased expression ranging from 30% to over 50% in some areas.  There is  an admixed T-cell  population in the background to a lesser extent as seen with CD3 and CD5 and there is no apparent co-expression of CD5 in B-cell areas.  The findings are most consistent with involvement by large B-cell  lymphoma,  ABC type.  Given the previous history of chronic lymphocytic leukemia/small lymphocytic lymphoma, this likely represents high-grade transformation.  Clinical correlation is recommended   02/26/2021 PET scan   1. Evidence of multi organ lymphoma recurrence. 2. Enlarging foci of hypermetabolic peripheral consolidation in both lungs consistent pulmonary lymphoma. 3. Multiple foci of hypermetabolic subcutaneous nodularity consistent with subcutaneous lymphoma. 4. Solitary hypermetabolic nodal metastasis in the central mesentery and peritoneal implant along the RIGHT iliacus muscle. 5. THree foci of metabolic activity in the skeleton consistent with skeletal lymphoma.     03/08/2021 Procedure   Status post CT-guided biopsy of right lower lobe lung mass   03/12/2021 Cancer Staging   Staging form: Hodgkin and Non-Hodgkin Lymphoma, AJCC 7th Edition - Clinical stage from 03/12/2021: Stage IV - Signed by [name], MD on 03/12/2021 Staged by: Managing physician Stage prefix: Recurrence Biopsy of metastatic site performed: Yes Source of metastatic specimen: Lung    03/17/2021 Echocardiogram    1. Left ventricular ejection fraction, by estimation, is 55 to 60%. The left ventricle has normal function. The left ventricle has no regional wall motion abnormalities. There is moderate concentric left ventricular hypertrophy. Left ventricular diastolic parameters are indeterminate. The average left ventricular global longitudinal strain is -20.4 %. The global longitudinal strain is normal.  2. Right ventricular systolic function is normal. The right ventricular size is normal. Tricuspid regurgitation signal is inadequate for assessing PA pressure.  3. The mitral valve is grossly normal. Mild mitral valve regurgitation. No evidence of mitral stenosis.  4. The aortic valve is tricuspid. There is mild calcification of the aortic valve. Aortic valve regurgitation is not visualized. Mild aortic valve  sclerosis is present, with no evidence of aortic valve stenosis.  5. The inferior vena cava is normal in size with greater than 50% respiratory variability, suggesting right atrial pressure of 3 mmHg.   03/19/2021 Procedure   Successful placement of a right internal jugular approach power injectable Port-A-Cath. The catheter is ready for immediate use.     03/29/2021 -  Chemotherapy   Patient is on Treatment Plan : NON-HODGKINS LYMPHOMA R-CHOP q21d     06/10/2021 Echocardiogram    1. Left ventricular ejection fraction, by estimation, is 55 to 60%. The left ventricle has normal function. The left ventricle has no regional wall motion abnormalities. There is mild left ventricular hypertrophy. Left ventricular diastolic parameters are indeterminate.  2. Right ventricular systolic function is normal. The right ventricular size is normal.  3. The mitral valve is normal in structure. Trivial mitral valve regurgitation.  4. The aortic valve is normal in structure. Aortic valve regurgitation is not visualized. No aortic stenosis is present.   06/14/2021 PET scan   1. Significant interval positive response to therapy. Residual hypermetabolic peripheral right lower lobe lung mass, decreased in size and metabolism. Additional previously visualized hypermetabolic sites of involvement in the skeleton, subcutaneous and muscular soft tissues and mesenteric lymph nodes have resolved. 2. New patchy ground-glass opacity with associated patchy hypermetabolism throughout both lungs, nonspecific, differential includes drug reaction or atypical infection. Consider attention on follow-up chest CT. 3. Chronic findings include: Aortic Atherosclerosis (ICD10-I70.0). Paranasal sinusitis. Right nephrolithiasis. Marked diffuse colonic diverticulosis. Coronary atherosclerosis. 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 Recommend annual imaging  followup by CTA or MRA.      REVIEW OF SYSTEMS:   Eyes: Denies blurriness of  vision Ears, nose, mouth, throat, and face: Denies mucositis or sore throat Respiratory: Reports cough and shortness of breath with minimal exertion Gastrointestinal:  Denies nausea, heartburn or change in bowel habits Skin: Denies abnormal skin rashes Lymphatics: Denies new lymphadenopathy or easy bruising Neurological:Denies numbness, tingling or new weaknesses Behavioral/Psych: Mood is stable, no new changes  Extremities: No lower extremity edema All other systems were reviewed with the patient and are negative.  I have reviewed the past medical history, past surgical history, social history and family history with the patient and they are unchanged from previous note.   PHYSICAL EXAMINATION: ECOG PERFORMANCE STATUS: 2 - Symptomatic, <50% confined to bed  Vitals:   08/24/21 0800 08/24/21 0900  BP:    Pulse: (!) 55 61  Resp: 18 18  Temp:    SpO2: 96% 95%   Filed Weights   08/19/21 1526 08/22/21 1005  Weight: 123.4 kg 121.5 kg    Intake/Output from previous day: 12/05 0701 - 12/06 0700 In: 1746.5 [P.O.:840; I.V.:557.5; IV Piggyback:349] Out: 450 [Urine:450]  GENERAL:alert, no distress and comfortable SKIN: Multiple ecchymoses to his arms LYMPH:  no palpable lymphadenopathy in the cervical, axillary or inguinal LUNGS: Diminished breath sounds HEART: regular rate & rhythm and no murmurs and no lower extremity edema ABDOMEN:abdomen soft, non-tender and normal bowel sounds NEURO: alert & oriented x 3 with fluent speech, no focal motor/sensory deficits  LABORATORY DATA:  I have reviewed the data as listed CMP Latest Ref Rng & Units 08/24/2021 08/23/2021 08/22/2021  Glucose 70 - 99 mg/dL 391(H) 172(H) 220(H)  BUN 8 - 23 mg/dL 64(H) 55(H) 45(H)  Creatinine 0.61 - 1.24 mg/dL 2.55(H) 2.27(H) 2.05(H)  Sodium 135 - 145 mmol/L 127(L) 132(L) 132(L)  Potassium 3.5 - 5.1 mmol/L 4.3 3.7 3.5  Chloride 98 - 111 mmol/L 92(L) 98 99  CO2 22 - 32 mmol/L $RemoveB'23 23 23  'IvXYrndZ$ Calcium 8.9 - 10.3 mg/dL  8.0(L) 8.1(L) 7.7(L)  Total Protein 6.5 - 8.1 g/dL - - -  Total Bilirubin 0.3 - 1.2 mg/dL - - -  Alkaline Phos 38 - 126 U/L - - -  AST 15 - 41 U/L - - -  ALT 0 - 44 U/L - - -    Lab Results  Component Value Date   WBC 5.3 08/24/2021   HGB 10.2 (L) 08/24/2021   HCT 31.7 (L) 08/24/2021   MCV 84.1 08/24/2021   PLT 93 (L) 08/24/2021   NEUTROABS 4.5 08/24/2021   I have reviewed CT imaging and x-ray myself DG Chest 1 View  Result Date: 08/23/2021 CLINICAL DATA:  Status post right thoracentesis EXAM: CHEST  1 VIEW COMPARISON:  08/22/2021 FINDINGS: Power port on the right has its tip at the SVC RA junction. There is some reduction of the amount of pleural fluid on the right. Moderate pleural effusion does persist with right lower lung atelectasis and or pneumonia. Left lower lobe atelectasis remains visible as well. No pneumothorax. IMPRESSION: No pneumothorax following thoracentesis. Some persistent pleural fluid on the right with right lower lobe atelectasis and or pneumonia. Electronically Signed   By: Nelson Chimes M.D.   On: 08/23/2021 15:38   DG Chest 2 View  Result Date: 07/29/2021 CLINICAL DATA:  Shortness of breath over the last 3 months with productive cough and dyspnea with exertion. Non-Hodgkin's lymphoma. EXAM: CHEST - 2 VIEW COMPARISON:  CT chest 08/04/2021  and chest radiograph 08/17/2021 FINDINGS: Continued substantial consolidation in the right lower lobe, potentially from pneumonia or pulmonary lymphoma. Mild indistinct airspace opacity in the left lower lobe as well, with slight obscuration of left hemidiaphragm. Power injectable right Port-A-Cath tip: SVC. Atherosclerotic calcification of the aortic arch. Prominence of mediastinal and pericardial adipose tissues as on recent CT. Trace fluid in the right lower major fissure. A component of pleural effusion on the right is not excluded. IMPRESSION: 1. Continued consolidation in the right lower lobe. The appearance could be from  pneumonia or pulmonary involvement by lymphoma. 2. Mild left lower lobe airspace opacity, nonspecific, pneumonia not excluded. 3. At least a trace right pleural effusion with fluid along the lower portion of the major fissure. 4.  Aortic Atherosclerosis (ICD10-I70.0). Electronically Signed   By: Van Clines M.D.   On: 08/03/2021 14:33   CT Chest Wo Contrast  Result Date: 08/13/2021 CLINICAL DATA:  Pneumonia.  Cough. EXAM: CT CHEST WITHOUT CONTRAST TECHNIQUE: Multidetector CT imaging of the chest was performed following the standard protocol without IV contrast. COMPARISON:  PET-CT 06/11/2021. FINDINGS: Cardiovascular: Heart and aorta are normal in size. There is no pericardial effusion. There are atherosclerotic calcifications of the aorta and coronary arteries. Right chest port catheter tip ends in the distal SVC. Superior vena cava is small in size, similar to the prior exam. Mediastinum/Nodes: The visualized thyroid gland is within normal limits. There is an enlarged subcarinal lymph node measuring 13 mm short axis similar to the prior study. Additional nonenlarged mediastinal lymph nodes are unchanged. Difficult to assess for right hilar adenopathy secondary to lack of contrast. Esophagus is unremarkable. Lungs/Pleura: There is a new moderate-sized right pleural effusion. There is some peripheral ill-defined ground-glass and solid nodular densities throughout the right lung measuring up to 8 mm. There is compressive atelectasis and small amount of airspace disease in the right lower lobe containing air bronchograms. There is some ground-glass and interstitial opacities in the left lower lobe is well. No pneumothorax. Trachea and central airways are patent. Upper Abdomen: Gallbladder surgically absent. Limited evaluation of the upper abdomen is otherwise within normal limits. Musculoskeletal: Multilevel degenerative changes affect the spine. No acute fractures are seen. IMPRESSION: 1. New  moderate-sized right pleural effusion. 2. Right lower lobe atelectasis and airspace disease worrisome for infection. 3. New diffuse right lung peripheral solid and ground-glass nodular densities, indeterminate. Findings may be infectious/inflammatory or neoplastic. 4. Ground-glass and interstitial opacities in the left lung base favors as infection/inflammation. 5. Stable enlarged subcarinal lymph node. Electronically Signed   By: Ronney Asters M.D.   On: 08/06/2021 18:43   CT CHEST WO CONTRAST  Result Date: 08/05/2021 CLINICAL DATA:  Shortness of breath for 2 months. History of CLL with chemo. Former smoker. EXAM: CT CHEST WITHOUT CONTRAST TECHNIQUE: Multidetector CT imaging of the chest was performed following the standard protocol without IV contrast. COMPARISON:  CT studies without contrast 02/05/2021 and 08/31/2020 FINDINGS: Cardiovascular: Borderline prominent heart. No pericardial effusion. Heavy three-vessel calcific CAD. Scattered calcifications in the aorta with ectatic ascending segment measuring 4.2 cm, unchanged. Upper limit of normal pulmonary trunk caliber. Mediastinum/Nodes: On the last CT there was a 1.5 cm soft tissue nodule anteriorly in the left axilla which is not seen today. There is a prominent posterior right hilar lymph node of 1.5 cm in short axis, previously 6 mm short axis. No other intrathoracic adenopathy is seen. Thyroid, trachea and esophagus are unremarkable without contrast. Lungs/Pleura: Mild features of subpleural fibrosis are  again noted predominating in the lower zones, but present from the apices inferiorly. This has shown no progression. There is early subpleural microcystic honeycombing in both lateral bases. There is increased subpleural micronodular interstitial change in the anterolateral aspect left upper lobe, level of the aortic arch concerning for pneumonitis/bronchiolitis. There is interval enlargement of a large pleural-based opacity with air bronchograms in the  posterior basilar right lower lobe which is now 10.6 x 7.4 cm, previously 7.1 x 4.4 cm. The previously noted pleural-based mass in the lateral base of the lingula has improved however , now measuring 2.1 x 1.5 cm, previously 3.3 x 1.8 cm. There is new hazy interstitial change in the anterior basal left lower lobe concerning for pneumonitis as well as peribronchovascular ground-glass and micronodular disease in the lateral aspect left lower lobe on series 8 axial images 87-90. Rest of the lungs are generally clear. There is no pleural effusion or pneumothorax. Musculoskeletal: There is osteopenia and degenerative changes of the spine. No suspicious chest wall lesions or destructive bone lesions. Right chest port with IJ approach catheter again noted with the tip in the SVC. Upper abdomen: 8 mm calculus again noted in the dependent right renal pelvis, with atrophic kidneys and moderate perinephric stranding as well as small nonobstructive caliceal stones in the right kidney. Soft tissue thickening posterior to left kidney now measures 3.7 x 1.4 cm with increased fatty lucency, previously 4.7 x 2.0 cm. IMPRESSION: 1. Enlarging area of consolidation with air bronchograms in the posterior basal right lower lobe but decreased size of the pre-existing lingular nodule and of a posterior left perinephric soft tissue mass. 2. Increased prominence of a single posterior right hilar lymph node of 1.6 cm short axis previously 0.6 cm without further adenopathy visible without contrast. 3. Areas of increased opacity in the left upper and lower lobes concerning for interval pneumonitis. Follow-up as indicated. 4. Basal-predominant subpleural fibrosis. 5. Coronary artery and aortic atherosclerosis. Borderline cardiomegaly. Electronically Signed   By: Telford Nab M.D.   On: 08/05/2021 02:15   [place] CHEST (PLEURAL EFFUSION)  Result Date: 08/19/2021 CLINICAL DATA:  Evaluate for thoracentesis. EXAM: CHEST ULTRASOUND COMPARISON:   Chest CT 07/31/2021 FINDINGS: Small amount of right pleural fluid but there is not a good percutaneous window. IMPRESSION: Small right pleural effusion. Thoracentesis not performed due to small volume and lack of safe percutaneous window. Electronically Signed   By: Markus Daft M.D.   On: 08/19/2021 13:40   CT Chest High Resolution  Result Date: 08/23/2021 CLINICAL DATA:  80 year old male with history of shortness of breath. Evaluate for interstitial lung disease. EXAM: CT CHEST WITHOUT CONTRAST TECHNIQUE: Multidetector CT imaging of the chest was performed following the standard protocol without intravenous contrast. High resolution imaging of the lungs, as well as inspiratory and expiratory imaging, was performed. COMPARISON:  No priors. FINDINGS: Cardiovascular: Heart size is normal. There is no significant pericardial fluid, thickening or pericardial calcification. There is aortic atherosclerosis, as well as atherosclerosis of the great vessels of the mediastinum and the coronary arteries, including calcified atherosclerotic plaque in the left main, left anterior descending, left circumflex and right coronary arteries. Mild calcifications of the aortic valve. Right internal jugular single-lumen porta cath with tip terminating in the distal superior vena cava. Mediastinum/Nodes: No pathologically enlarged mediastinal or hilar lymph nodes. Please note that accurate exclusion of hilar adenopathy is limited on noncontrast CT scans. Esophagus is unremarkable in appearance. No axillary lymphadenopathy. Lungs/Pleura: High-resolution imaging is limited by presence of  a moderate-sized right-sided pleural effusion which is associated with near complete atelectasis and consolidation of the right lower lobe. With these limitations in mind, in the well aerated portions of the lungs there are patchy areas of ground-glass attenuation, septal thickening, small amount of subpleural reticulation and thickening of the  peribronchovascular interstitium with some regional architectural distortion. No definite traction bronchiectasis or frank honeycombing confidently identified. Inspiratory and expiratory imaging is remarkable for very mild air trapping indicative of small airways disease. Upper Abdomen: Esophagus is unremarkable in appearance. No axillary lymphadenopathy. Musculoskeletal: There are no aggressive appearing lytic or blastic lesions noted in the visualized portions of the skeleton. IMPRESSION: 1. There is evidence of interstitial lung disease, with a spectrum of findings considered indeterminate for usual interstitial pneumonia (UIP) per current ATS guidelines. Follow-up high-resolution chest CT is recommended in 12 months to assess for temporal changes in the appearance of the lung parenchyma. 2. Moderate right pleural effusion with a combination of airspace consolidation and atelectasis in the right lower lobe, concerning for right lower lobe pneumonia. 3. Aortic atherosclerosis, in addition to left main and 3 vessel coronary artery disease. Assessment for potential risk factor modification, dietary therapy or pharmacologic therapy may be warranted, if clinically indicated. 4. There are calcifications of the aortic valve. Echocardiographic correlation for evaluation of potential valvular dysfunction may be warranted if clinically indicated. Aortic Atherosclerosis (ICD10-I70.0). Electronically Signed   By: Vinnie Langton M.D.   On: 08/23/2021 08:18   DG CHEST PORT 1 VIEW  Result Date: 08/22/2021 CLINICAL DATA:  80 year old male with increase shortness of breath. 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 Small pleural effusion not feasible for thoracentesis on 08/19/2021. EXAM: PORTABLE CHEST 1 VIEW COMPARISON:  Chest CT 07/31/2021 and earlier. FINDINGS: Portable AP semi upright view at 0843 hours. Stable right chest Port-A-Cath, power port which is currently accessed. Mildly lower lung volumes. Progressive failing  and confluent opacity in the right lower lung and about the hilum. Stable visible mediastinal contours. Left lung appears stable. No pneumothorax or pulmonary edema. IMPRESSION: Progressive right airspace disease and/or pleural effusion since the CT on 07/24/2021. Repeat Chest Ultrasound for feasibility of thoracentesis now might be valuable. Lower lung volumes, left lung remains clear. Electronically Signed   By: Genevie Ann M.D.   On: 08/22/2021 08:57   VAS [place] LOWER EXTREMITY VENOUS (DVT) (7a-7p)  Result Date: 08/19/2021  Lower Venous DVT Study Patient Name:  KRISHNA DANCEL  Date of Exam:   08/14/2021 Medical Rec #: [id number]    Accession #:    [id number] Date of Birth: 1940/10/27    Patient Gender: M Patient Age:   80 years Exam Location:  [facility] Procedure:      VAS [place] LOWER EXTREMITY VENOUS (DVT) Referring Phys: Fredia Sorrow --------------------------------------------------------------------------------  Indications: Edema, and Palpable Cord.  Comparison Study: No prior study Performing Technologist: Maudry Mayhew MHA, RDMS, RVT, RDCS  Examination Guidelines: A complete evaluation includes B-mode imaging, spectral Doppler, color Doppler, and power Doppler as needed of all accessible portions of each vessel. Bilateral testing is considered an integral part of a complete examination. Limited examinations for reoccurring indications may be performed as noted. The reflux portion of the exam is performed with the patient in reverse Trendelenburg.  +-----+---------------+---------+-----------+----------+--------------+ RIGHTCompressibilityPhasicitySpontaneityPropertiesThrombus Aging +-----+---------------+---------+-----------+----------+--------------+ CFV  Full           Yes      Yes                                 +-----+---------------+---------+-----------+----------+--------------+   +---------+---------------+---------+-----------+----------+--------------+  LEFT      CompressibilityPhasicitySpontaneityPropertiesThrombus Aging +---------+---------------+---------+-----------+----------+--------------+ CFV      Full           Yes      Yes                                 +---------+---------------+---------+-----------+----------+--------------+ SFJ      Full                                                        +---------+---------------+---------+-----------+----------+--------------+ FV Prox  Full                                                        +---------+---------------+---------+-----------+----------+--------------+ FV Mid   Full                                                        +---------+---------------+---------+-----------+----------+--------------+ FV DistalFull                                                        +---------+---------------+---------+-----------+----------+--------------+ PFV      Full                                                        +---------+---------------+---------+-----------+----------+--------------+ POP      Full           Yes      Yes                                 +---------+---------------+---------+-----------+----------+--------------+ PTV      Full                                                        +---------+---------------+---------+-----------+----------+--------------+ PERO     Full                                                        +---------+---------------+---------+-----------+----------+--------------+ Gastroc  Full                                                        +---------+---------------+---------+-----------+----------+--------------+  Summary: RIGHT: - No evidence of common femoral vein obstruction.  LEFT: - There is no evidence of deep vein thrombosis in the lower extremity.  - No cystic structure found in the popliteal fossa.  *See table(s) above for measurements and observations. Electronically signed by  Monica Martinez MD on 08/19/2021 at 5:23:22 PM.    Final    ECHOCARDIOGRAM LIMITED  Result Date: 08/22/2021    ECHOCARDIOGRAM LIMITED REPORT   Patient Name:   ECTOR LAUREL Date of Exam: 08/22/2021 Medical Rec #:  [id number]   Height:       76.0 in Accession #:    [id number]  Weight:       267.9 lb Date of Birth:  07/15/1941   BSA:          2.508 m Patient Age:    56 years    BP:           115/71 mmHg Patient Gender: M           HR:           80 bpm. Exam Location:  Inpatient Procedure: 2D Echo, Limited Echo and Intracardiac Opacification Agent Indications:    CHF  History:        Patient has prior history of Echocardiogram examinations. CAD,                 COPD; Risk Factors:Hypertension and Diabetes.  Sonographer:    Jyl Heinz Referring Phys: [id number] PAULA V ROSS IMPRESSIONS  1. Windows still challenging. With contrast, LV function is mildly reduced. Hypokinesis of the inferior, inferoseptal wall ; as well as anteroseptum as well. RV function is normal. IVC is normal and collapses RA pressure 3. FINDINGS  Left Ventricle: Windows still challenging. With contrast, LV function is mildly reduced. Hypokinesis of the inferior, inferoseptal wall ; as well as anteroseptum as well. RV function is normal. IVC is normal and collapses RA pressure 3.  LV Volumes (MOD) LV vol d, MOD A2C: 125.0 ml LV vol d, MOD A4C: 127.0 ml LV vol s, MOD A2C: 53.0 ml LV vol s, MOD A4C: 53.3 ml LV SV MOD A2C:     72.0 ml LV SV MOD A4C:     127.0 ml LV SV MOD BP:      75.6 ml IVC IVC diam: 1.30 cm Phineas Inches Electronically signed by Phineas Inches Signature Date/Time: 08/22/2021/1:49:49 PM    Final    ECHOCARDIOGRAM LIMITED  Result Date: 08/21/2021    ECHOCARDIOGRAM LIMITED REPORT   Patient Name:   DARCEL FRANE Date of Exam: 08/21/2021 Medical Rec #:  [id number]   Height:       76.0 in Accession #:    [id number]  Weight:       272.0 lb Date of Birth:  05/18/1941   BSA:          2.524 m Patient Age:    38 years    BP:           127/64 mmHg Patient  Gender: M           HR:           83 bpm. Exam Location:  Inpatient Procedure: Limited Echo, Limited Color Doppler and Cardiac Doppler Indications:    CHF-Acute Diastolic C37.62  History:        Patient has prior history of Echocardiogram examinations, most                 recent 06/07/2021.  Cardiomyopathy; Risk Factors:Hypertension and                 Diabetes.  Sonographer:    Bernadene Person RDCS Referring Phys: [id number] AMRIT ADHIKARI IMPRESSIONS  1. Poor acoustic windows limt study LVEF may be mildly depressed REcomm limited echo with Defiinity to confirm wall motion Images suggestive that inferior/inferoseptal walls are hypokinetic . There is moderate left ventricular hypertrophy.  2. Right ventricular systolic function is low normal. The right ventricular size is mildly enlarged. There is normal pulmonary artery systolic pressure.  3. MR is eccentric, directed posterior into LA. Probable moderate . Moderate mitral valve regurgitation.  4. The aortic valve is tricuspid. Aortic valve regurgitation is not visualized. Aortic valve sclerosis/calcification is present, without any evidence of aortic stenosis.  5. Aortic dilatation noted. There is mild dilatation of the aortic root, measuring 41 mm. There is mild dilatation of the ascending aorta, measuring 38 mm.  6. The inferior vena cava is normal in size with greater than 50% respiratory variability, suggesting right atrial pressure of 3 mmHg. FINDINGS  Left Ventricle: Poor acoustic windows limt study LVEF may be mildly depressed REcomm limited echo with Defiinity to confirm wall motion Images suggestive that inferior/inferoseptal walls are hypokinetic. The left ventricular internal cavity size was normal in size. There is moderate left ventricular hypertrophy. Right Ventricle: The right ventricular size is mildly enlarged. Right ventricular systolic function is low normal. There is normal pulmonary artery systolic pressure. The tricuspid regurgitant velocity is  2.38 m/s, and with an assumed right atrial pressure of 3 mmHg, the estimated right ventricular systolic pressure is 57.9 mmHg. Left Atrium: Left atrial size was normal in size. Right Atrium: Right atrial size was normal in size. Pericardium: There is no evidence of pericardial effusion. Mitral Valve: MR is eccentric, directed posterior into LA. Probable moderate. There is mild thickening of the mitral valve leaflet(s). Moderate mitral valve regurgitation. Tricuspid Valve: The tricuspid valve is grossly normal. Tricuspid valve regurgitation is mild. Aortic Valve: The aortic valve is tricuspid. Aortic valve regurgitation is not visualized. Aortic valve sclerosis/calcification is present, without any evidence of aortic stenosis. Pulmonic Valve: The pulmonic valve was not well visualized. Pulmonic valve regurgitation is not visualized. Aorta: Aortic dilatation noted. There is mild dilatation of the aortic root, measuring 41 mm. There is mild dilatation of the ascending aorta, measuring 38 mm. Venous: The inferior vena cava is normal in size with greater than 50% respiratory variability, suggesting right atrial pressure of 3 mmHg. IAS/Shunts: The interatrial septum was not assessed. LEFT VENTRICLE PLAX 2D LVIDd:         4.50 cm     Diastology LVIDs:         2.60 cm     LV e' medial:    3.38 cm/s LV PW:         1.50 cm     LV E/e' medial:  19.7 LV IVS:        1.30 cm     LV e' lateral:   6.08 cm/s LVOT diam:     2.10 cm     LV E/e' lateral: 10.9 LV SV:         62 LV SV Index:   25 LVOT Area:     3.46 cm  LV Volumes (MOD) LV vol d, MOD A2C: 96.0 ml LV vol d, MOD A4C: 81.0 ml LV vol s, MOD A2C: 51.6 ml LV vol s, MOD A4C: 41.9 ml LV SV MOD A2C:  44.4 ml LV SV MOD A4C:     81.0 ml LV SV MOD BP:      41.0 ml RIGHT VENTRICLE RV S prime:     13.50 cm/s TAPSE (M-mode): 2.5 cm LEFT ATRIUM           Index LA diam:      3.90 cm 1.54 cm/m LA Vol (A4C): 46.2 ml 18.30 ml/m  AORTIC VALVE LVOT Vmax:   103.00 cm/s LVOT Vmean:   69.400 cm/s LVOT VTI:    0.180 m  AORTA Ao Root diam: 4.10 cm Ao Asc diam:  3.80 cm MITRAL VALVE                  TRICUSPID VALVE MV Area (PHT): 3.46 cm       TR Peak grad:   22.7 mmHg MV Decel Time: 219 msec       TR Vmax:        238.00 cm/s MR Peak grad:    118.8 mmHg MR Mean grad:    81.0 mmHg    SHUNTS MR Vmax:         545.00 cm/s  Systemic VTI:  0.18 m MR Vmean:        423.0 cm/s   Systemic Diam: 2.10 cm MR PISA:         1.57 cm MR PISA Eff ROA: 11 mm MR PISA Radius:  0.50 cm MV E velocity: 66.50 cm/s MV A velocity: 48.00 cm/s MV E/A ratio:  1.39 Dorris Carnes MD Electronically signed by Dorris Carnes MD Signature Date/Time: 08/21/2021/4:03:52 PM    Final    US THORACENTESIS ASP PLEURAL SPACE W/IMG GUIDE  Result Date: 08/23/2021 INDICATION: Patient with a history of large B-cell lymphoma and hospital admission for respiratory failure secondary to COVID pneumonia. Patient found to have right pleural effusion. Interventional radiology asked to perform a diagnostic and therapeutic thoracentesis. EXAM: ULTRASOUND GUIDED THORACENTESIS MEDICATIONS: 1% lidocaine 10 mL COMPLICATIONS: None immediate. PROCEDURE: An ultrasound guided thoracentesis was thoroughly discussed with the patient and questions answered. The benefits, risks, alternatives and complications were also discussed. The patient understands and wishes to proceed with the procedure. Written consent was obtained. Ultrasound was performed to localize and mark an adequate pocket of fluid in the right chest. The area was then prepped and draped in the normal sterile fashion. 1% Lidocaine was used for local anesthesia. Under ultrasound guidance a 6 Fr Safe-T-Centesis catheter was introduced. Thoracentesis was performed. The catheter was removed and a dressing applied. FINDINGS: A total of approximately 500 mL of light brown fluid was removed. Samples were sent to the laboratory as requested by the clinical team. IMPRESSION: Successful ultrasound guided right  thoracentesis yielding 500 mL of pleural fluid. Read by: Soyla Dryer, NP Electronically Signed   By: Markus Daft M.D.   On: 08/23/2021 15:45

## 2021-08-24 NOTE — TOC Progression Note (Signed)
Transition of Care [facility]) - Progression Note   Patient Details  Name: Derek [name] MRN: [id number] Date of Birth: [birth year]  [facility] CM/SW Villal[place], LCSW Phone Number: 08/24/2021, 3:00 PM  Clinical Narrative: CSW received call from patient's son, Marguerite Olea [phone]). Per son, he is unsure if the patient would be agreeable to SNF for short-term rehab as the patient seemed to be doing better today. Son aware that rehab may continued to be recommended even if patient continues to improve. TOC to follow.  Expected Discharge Plan: [place] Barriers to Discharge: Continued Medical Work up  Expected Discharge Plan and Services Expected Discharge Plan: [place] In-house Referral: Clinical Social Work   Post Acute Care Choice: [place] Living arrangements for the past 2 months: Single Family Home                 DME Arranged: N/A DME Agency: NA                   Social Determinants of Health (SDOH) Interventions    Readmission Risk Interventions Readmission Risk Prevention Plan 08/23/2021  Transportation Screening Complete  Medication Review [employer]) Complete  HRI or [place] Complete  SW Recovery Care/Counseling Consult Complete  Palliative Care Screening Not Applicable  Skilled Nursing Facility Not Complete  SNF Comments Family to discuss SNF with patient  Some recent data might be hidden

## 2021-08-24 NOTE — Progress Notes (Addendum)
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  Treated with chemotherapy and Rituxan.    Had COVID in February versus August 2022 2022 and persistent PCR positivity since then.  60 pound weight loss in the last 8 months.  He also has worsening shortness of breath for the last 3 months.  During these 3 months he has been off his chemotherapy.  He is also on chronic prednisone since around October 2022 by hematology Dr. Simeon Craft such.      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  By May 2022 he developed a right lower lobe density.  He had CT-guided biopsy 03/08/2021 that shows atypical lymphoid infiltrate consistent with non-Hodgkin B-cell lymphoma.  In September 2022 PET scans do seem to be slightly getting better.  However, that is progressively worse mid November 2022 CT scan and also 08/14/2021 admit CT scan.  This fits in with his progressive hypoxemia.  The ILD changes itself appears to be somewhat worse but is hard to say..  The overwhelming complex here massive right lower lobe density -with air bronchograms that is getting worse.  [In September 2022 PET scan it seems to be completely getting better.]    Pulmonary consultation in the office 07/21/2021 Dr. Baltazar Apo was concerned about drug-induced lung injury versus long COVID residual infiltrates.  Plan was to continue prednisone for another month and then  taper to 10 mg for a month and then slowly taper off.  He then presented 08/01/2021 with fever of 103, hypoxemic 85% on room air corrected with 4 L nasal cannula, leukopenia 2.3 with a left shift and a hemoglobin of 5.3.  [Pancytopenia] CT chest at admission shows new onset moderate right oral effusion [too small for thoracentesis per ultrasound], right lower lobe atelectasis airspace disease worrisome for infection and new diffuse right lung peripheral solid and groundglass nodular densities along with groundglass and interstitial opacities in the left lung.  Back in May 2022 thoracic radiology consider some of these findings as "probable UIP".  His subcarinal lymph node itself is stable.  Is being treated with azithromycin and ceftriaxone.  Also IV Lasix for possible volume overload.  Despite this he is progressing requiring 15 L nasal cannula.  He was transferred to intensive care unit and pulmonary critical care medicine has been consulted.  Urine Streptococcus negative.  He had an echocardiogram for window: There is concern for slightly reduced ejection fraction probably related to chemotherapy.  Of note patient reported that his pedal edema has improved with Lasix although his hypoxemia is worse.  Denies any chest pain.    Past Medical History:    has a past medical history of Blood dyscrasia, CLL (chronic lymphocytic leukemia) ([place]) (09/10/2013), Diabetes mellitus, type II ([place]), Dilated cardiomyopathy ([place]) (09/14/2018), GERD (gastroesophageal reflux disease), H/O cardiac catheterization, H/O exercise stress test (1999, 2002), Hypertension, Hypertriglyceridemia, Lymphocytosis, Obesity, and Pulmonary Function Test (09/2018).   reports that he quit  smoking about 32 years ago. His smoking use included cigarettes. He has a 30.00 pack-year smoking history. He has never used smokeless tobacco.  Past Surgical History:  Procedure Laterality Date   CARDIOVERSION N/A 09/05/2018   Procedure:  CARDIOVERSION;  Surgeon: Jerline Pain, MD;  Location: [place];  Service: Cardiovascular;  Laterality: N/A;   CARDIOVERSION N/A 10/22/2018   Procedure: CARDIOVERSION;  Surgeon: Jerline Pain, MD;  Location: [place];  Service: Cardiovascular;  Laterality: N/A;   CARDIOVERSION N/A 08/28/2020   Procedure: CARDIOVERSION;  Surgeon: Josue [name], MD;  Location: [place];  Service: Cardiovascular;  Laterality: N/A;   CARDIOVERSION N/A 11/30/2020   Procedure: CARDIOVERSION;  Surgeon: Jerline Pain, MD;  Location: [facility] ENDOSCOPY;  Service: Cardiovascular;  Laterality: N/A;   CARDIOVERSION N/A 04/22/2021   Procedure: CARDIOVERSION;  Surgeon: Buford Dresser, MD;  Location: [facility] ENDOSCOPY;  Service: Cardiovascular;  Laterality: N/A;   CHOLECYSTECTOMY N/A 07/12/2016   Procedure: LAPAROSCOPIC CHOLECYSTECTOMY;  Surgeon: Donnie Mesa, MD;  Location: [place];  Service: General;  Laterality: N/A;   EXTRACORPOREAL SHOCK WAVE LITHOTRIPSY Right 11/18/2019   Procedure: EXTRACORPOREAL SHOCK WAVE LITHOTRIPSY (ESWL);  Surgeon: Lucas Mallow, MD;  Location: [facility];  Service: Urology;  Laterality: Right;   FOOT SURGERY Right    IR IMAGING GUIDED PORT INSERTION  03/19/2021   NASAL SINUS SURGERY     TEE WITHOUT CARDIOVERSION N/A 09/05/2018   Procedure: TRANSESOPHAGEAL ECHOCARDIOGRAM (TEE);  Surgeon: Jerline Pain, MD;  Location: [facility] ENDOSCOPY;  Service: Cardiovascular;  Laterality: N/A;   TEE WITHOUT CARDIOVERSION N/A 08/28/2020   Procedure: TRANSESOPHAGEAL ECHOCARDIOGRAM (TEE);  Surgeon: Josue [name], MD;  Location: [facility] ENDOSCOPY;  Service: Cardiovascular;  Laterality: N/A;   TONSILLECTOMY     UMBILICAL HERNIA REPAIR N/A 07/12/2016   Procedure: [place];  Surgeon: Donnie Mesa, MD;  Location: [place];  Service: General;  Laterality: N/A;   vericose vein stripping      Allergies  Allergen Reactions   Lipitor [Atorvastatin] Rash   Metformin Diarrhea     Immunization History  Administered Date(s) Administered   Influenza Split 06/09/2009, 07/19/2011, 09/05/2012, 06/19/2014, 06/25/2015   Influenza, High Dose Seasonal PF 06/07/2016, 06/12/2017, 07/04/2018, 05/31/2019   Influenza,inj,Quad PF,6+ Mos 06/11/2014, 06/25/2015   Influenza-Unspecified 07/21/2019   PFIZER(Purple Top)SARS-COV-2 Vaccination 02/13/2020, 03/05/2020, 06/01/2020   Pneumococcal Conjugate-13 07/09/2014   Pneumococcal Polysaccharide-23 12/08/2009   Td 09/09/2002, 12/18/2012   Zoster Recombinat (Shingrix) 06/14/2019   Zoster, Live 06/18/2012, 06/14/2019, 01/22/2020    Family History  Problem Relation Age of Onset   Heart attack Mother    Cancer Father        liver     Current Facility-Administered Medications:    (feeding supplement) PROSource Plus liquid 30 mL, 30 mL, Oral, BID BM, Adhikari, Amrit, MD, 30 mL at 08/23/21 1941   0.9 %  sodium chloride infusion, , Intravenous, PRN, Shelly Coss, MD, Stopped at 08/22/21 2319   albuterol (VENTOLIN HFA) 108 (90 Base) MCG/ACT inhaler 2 puff, 2 puff, Inhalation, Q2H PRN, Fredia Sorrow, MD, 2 puff at 08/22/21 2034   benzonatate (TESSALON) capsule 200 mg, 200 mg, Oral, TID PRN, Tu, Ching T, DO   Chlorhexidine Gluconate Cloth 2 % PADS 6 each, 6 each, Topical, Daily, Kathryne Eriksson, NP, 6 each at 08/23/21 1941   feeding supplement (ENSURE ENLIVE / ENSURE PLUS) liquid 237 mL, 237 mL, Oral, BID BM, Adhikari, Amrit, MD, 237 mL at 08/23/21 0921   ipratropium-albuterol (DUONEB) 0.5-2.5 (  3) MG/3ML nebulizer solution 3 mL, 3 mL, Nebulization, Q6H PRN, Tawanna Solo, Amrit, MD, 3 mL at 08/22/21 0831   levothyroxine (SYNTHROID) tablet 25 mcg, 25 mcg, Oral, Q0600, Tu, Ching T, DO, 25 mcg at 08/24/21 0506   MEDLINE mouth rinse, 15 mL, Mouth Rinse, BID, Adhikari, Amrit, MD, 15 mL at 08/23/21 2239   methylPREDNISolone sodium succinate (SOLU-MEDROL) 125 mg/2 mL injection 60 mg, 60 mg, Intravenous, Q12H, McQuaid, Douglas B, MD, 60 mg at  08/24/21 2458   metoprolol succinate (TOPROL-XL) 24 hr tablet 12.5 mg, 12.5 mg, Oral, Daily, Tu, Ching T, DO, 12.5 mg at 08/23/21 0919   mometasone-formoterol (DULERA) 100-5 MCG/ACT inhaler 2 puff, 2 puff, Inhalation, BID, Tu, Ching T, DO, 2 puff at 08/23/21 1941   multivitamin with minerals tablet 1 tablet, 1 tablet, Oral, Daily, Adhikari, Amrit, MD, 1 tablet at 08/23/21 0998   oxyCODONE (Oxy IR/ROXICODONE) immediate release tablet 5 mg, 5 mg, Oral, Q6H PRN, Tawanna Solo, Amrit, MD, 5 mg at 08/23/21 2138   polyethylene glycol (MIRALAX / GLYCOLAX) packet 17 g, 17 g, Oral, Daily, Adhikari, Amrit, MD, 17 g at 08/23/21 1254   Rivaroxaban (XARELTO) tablet 15 mg, 15 mg, Oral, Q supper, Adhikari, Amrit, MD, 15 mg at 08/23/21 1725   rosuvastatin (CRESTOR) tablet 10 mg, 10 mg, Oral, Daily, Tu, Ching T, DO, 10 mg at 08/23/21 0919   senna (SENOKOT) tablet 8.6 mg, 1 tablet, Oral, BID, Adhikari, Amrit, MD, 8.6 mg at 08/23/21 2138   tamsulosin (FLOMAX) capsule 0.4 mg, 0.4 mg, Oral, Daily, Tu, Ching T, DO, 0.4 mg at 08/23/21 0919   umeclidinium bromide (INCRUSE ELLIPTA) 62.5 MCG/ACT 1 puff, 1 puff, Inhalation, Daily, Tu, Ching T, DO, 1 puff at 08/23/21 [facility] Events:  07/21/2021 - admit 12/5 Continued 15L HFNC  Interim History / Subjective:   Remains on 15L HFNC, feels slightly better after thoracentesis yesterday After discussion with Dr. [place] Bells and son has decided on DNR Received IVIG yesterday   Objective   Blood pressure 128/69, pulse (!) 57, temperature 98 F (36.7 C), temperature source Oral, resp. rate 19, height 6\' 4"  (1.93 m), weight 121.5 kg, SpO2 96 %.        Intake/Output Summary (Last 24 hours) at 08/24/2021 0827 Last data filed at 08/24/2021 0500 Gross per 24 hour  Intake 1746.5 ml  Output 450 ml  Net 1296.5 ml    Filed Weights   08/19/21 1526 08/22/21 1005  Weight: 123.4 kg 121.5 kg   General:  fatigued, chronically ill-appearing M, awake sitting up in  bed in no acute distress HEENT: MM pink/moist, sclera anicteric  Neuro: awake, alert and oriented, answering questions appropriately without focal deficits CV: s1s2 rrr, no m/r/g PULM:  remains on 15L [place] with mild tachypnea, decreased air entry in the bilateral bases without significant rhonchi or wheezing GI: soft, bsx4 active  Extremities: warm/dry, no edema  Skin: no rashes or lesions    Resolved Hospital Problem list     Assessment & Plan:    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 Failure Likely multifactorial etiology included CAP and pleural effusion with likely repeat Covid-19 infection 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 vs drug induced lung injury from amiodarone/chemo with tobacco use history and COPD High 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/4 consistent with UIP, R pleural effusion with airspace consolidation concerning for RLL PNA -possibly progression of patient's lymphoma, follow cytology and pleural fluid studies from thoracentesis yesterday -covid-19 cycle threshold consistent with acute infection, Remdesevir not indicated at this  point, steroids initiated -continue HFNC to maintain sats >92% -completed 5 day course Cefepime/Azithromycin blood cultures negative  -continue Albuterol, Dulera, Incruse Ellipta  -Bipap qhs -Quantiferon gold, Sjogrens syndrome, ANCA titers, Glomerular baseline membrane Ab, Anti-DNA ab, RF, ANA, legionella all pending -pt now DNR     B-cell Lymphoma with pan-cytopenia Right lower lobe lung mass 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; status post CT-guided thoracic biopsy 03/09/2019 -biopsy features consistent with B-cell lymphoma -Appreciate heme/Onc recommendations, pt to receive IVIG, cannot move forwards with chemotherapy until his respiratory status has improved -if not showing signs of respiratory function improvement would benefit from palliative consult as no further treatment available from oncologic standpoint -WBC and platelets improving, continue to  trend -Per oncology hold off Granix    MSK -Failure to thrive with significant weight loss -nutrition evaluated   Atrial Fibrillation/Flutter -resume Xarelto post-procedure -continue Metoprolol, amiodarone held 2/2 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 [place] Bells 12/5, pt confirmed DNR 12/6   ATTESTATION & SIGNATURE   CRITICAL CARE Performed by: Otilio Carpen Anicia Leuthold   Total critical care time: 33 minutes  Critical care time was exclusive of separately billable procedures and treating other patients.  Critical care was necessary to treat or prevent imminent or life-threatening deterioration.  Critical care was time spent personally by me on the following activities: development of treatment plan with patient and/or surrogate as well as nursing, discussions with consultants, evaluation of patient's response to treatment, examination of patient, obtaining history from patient or surrogate, ordering and performing treatments and interventions, ordering and review of laboratory studies, ordering and review of radiographic studies, pulse oximetry and re-evaluation of patient's condition.  SIGNATURE   Otilio Carpen Elica Almas, PA-C [facility] See Amion for pager If no response to pager , please call [phone] until 7pm After 7:00 pm call Elink  [phone]  Lab 08/22/21 0840  PHART 7.425  PCO2ART 31.7*  PO2ART 89.7  HCO3 20.4  O2SAT 96.9     CBC Recent Labs  Lab 08/22/21 0409 08/23/21 0330 08/24/21 0343  HGB 10.4* 11.0* 10.2*  HCT 32.7* 34.0* 31.7*  WBC 1.8* 3.7* 5.3  PLT 107* 104* 93*     COAGULATION No results for input(s): INR in the last 168 hours.  CARDIAC  No results for input(s): TROPONINI in the last 168 hours. No results for input(s): PROBNP in the last 168 hours.   CHEMISTRY Recent Labs  Lab 08/20/21 0403  08/21/21 0432 08/22/21 0409 08/23/21 0330 08/24/21 0343  NA 135 132* 132* 132* 127*  K 3.5 3.5 3.5 3.7 4.3  CL 104 101 99 98 92*  CO2 22 24 23 23 23   GLUCOSE 109* 139* 220* 172* 391*  BUN 35* 41* 45* 55* 64*  CREATININE 1.98* 2.09* 2.05* 2.27* 2.55*  CALCIUM 7.9* 7.7* 7.7* 8.1* 8.0*    Estimated Creatinine Clearance: 33.5 mL/min (A) (by C-G formula based on SCr of 2.55 mg/dL (H)).   LIVER Recent Labs  Lab 08/10/2021 1551  AST 33  ALT 15  ALKPHOS 62  BILITOT 0.8  PROT 6.3*  ALBUMIN 2.7*      INFECTIOUS Recent Labs  Lab 08/02/2021 2206 08/22/21 1340 08/23/21 0330 08/24/21 0343  LATICACIDVEN 1.3  --   --   --   PROCALCITON  --  0.21 0.33 0.43  ENDOCRINE CBG (last 3)  No results for input(s): GLUCAP in the last 72 hours.       IMAGING x48h  - image(s) personally visualized  -   highlighted in bold DG Chest 1 View  Result Date: 08/23/2021 CLINICAL DATA:  Status post right thoracentesis EXAM: CHEST  1 VIEW COMPARISON:  08/22/2021 FINDINGS: Power port on the right has its tip at the SVC RA junction. There is some reduction of the amount of pleural fluid on the right. Moderate pleural effusion does persist with right lower lung atelectasis and or pneumonia. Left lower lobe atelectasis remains visible as well. No pneumothorax. IMPRESSION: No pneumothorax following thoracentesis. Some persistent pleural fluid on the right with right lower lobe atelectasis and or pneumonia. Electronically Signed   By: Nelson Chimes M.D.   On: 08/23/2021 15:38   CT Chest High Resolution  Result Date: 08/23/2021 CLINICAL DATA:  80 year old male with history of shortness of breath. Evaluate for interstitial lung disease. EXAM: CT CHEST WITHOUT CONTRAST TECHNIQUE: Multidetector CT imaging of the chest was performed following the standard protocol without intravenous contrast. High resolution imaging of the lungs, as well as inspiratory and expiratory imaging, was performed. COMPARISON:   No priors. FINDINGS: Cardiovascular: Heart size is normal. There is no significant pericardial fluid, thickening or pericardial calcification. There is aortic atherosclerosis, as well as atherosclerosis of the great vessels of the mediastinum and the coronary arteries, including calcified atherosclerotic plaque in the left main, left anterior descending, left circumflex and right coronary arteries. Mild calcifications of the aortic valve. Right internal jugular single-lumen porta cath with tip terminating in the distal superior vena cava. Mediastinum/Nodes: No pathologically enlarged mediastinal or hilar lymph nodes. Please note that accurate exclusion of hilar adenopathy is limited on noncontrast CT scans. Esophagus is unremarkable in appearance. No axillary lymphadenopathy. Lungs/Pleura: High-resolution imaging is limited by presence of a moderate-sized right-sided pleural effusion which is associated with near complete atelectasis and consolidation of the right lower lobe. With these limitations in mind, in the well aerated portions of the lungs there are patchy areas of ground-glass attenuation, septal thickening, small amount of subpleural reticulation and thickening of the peribronchovascular interstitium with some regional architectural distortion. No definite traction bronchiectasis or frank honeycombing confidently identified. Inspiratory and expiratory imaging is remarkable for very mild air trapping indicative of small airways disease. Upper Abdomen: Esophagus is unremarkable in appearance. No axillary lymphadenopathy. Musculoskeletal: There are no aggressive appearing lytic or blastic lesions noted in the visualized portions of the skeleton. IMPRESSION: 1. There is evidence of interstitial lung disease, with a spectrum of findings considered indeterminate for usual interstitial pneumonia (UIP) per current ATS guidelines. Follow-up high-resolution chest CT is recommended in 12 months to assess for  temporal changes in the appearance of the lung parenchyma. 2. Moderate right pleural effusion with a combination of airspace consolidation and atelectasis in the right lower lobe, concerning for right lower lobe pneumonia. 3. Aortic atherosclerosis, in addition to left main and 3 vessel coronary artery disease. Assessment for potential risk factor modification, dietary therapy or pharmacologic therapy may be warranted, if clinically indicated. 4. There are calcifications of the aortic valve. Echocardiographic correlation for evaluation of potential valvular dysfunction may be warranted if clinically indicated. Aortic Atherosclerosis (ICD10-I70.0). Electronically Signed   By: Vinnie Langton M.D.   On: 08/23/2021 08:18   DG CHEST PORT 1 VIEW  Result Date: 08/22/2021 CLINICAL DATA:  80 year old male with increase shortness of breath. 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 Small pleural  effusion not feasible for thoracentesis on 08/19/2021. EXAM: PORTABLE CHEST 1 VIEW COMPARISON:  Chest CT 08/11/2021 and earlier. FINDINGS: Portable AP semi upright view at 0843 hours. Stable right chest Port-A-Cath, power port which is currently accessed. Mildly lower lung volumes. Progressive failing and confluent opacity in the right lower lung and about the hilum. Stable visible mediastinal contours. Left lung appears stable. No pneumothorax or pulmonary edema. IMPRESSION: Progressive right airspace disease and/or pleural effusion since the CT on 07/22/2021. Repeat Chest Ultrasound for feasibility of thoracentesis now might be valuable. Lower lung volumes, left lung remains clear. Electronically Signed   By: Genevie Ann M.D.   On: 08/22/2021 08:57   ECHOCARDIOGRAM LIMITED  Result Date: 08/22/2021    ECHOCARDIOGRAM LIMITED REPORT   Patient Name:   SHAMMOND ARAVE Date of Exam: 08/22/2021 Medical Rec #:  [id number]   Height:       76.0 in Accession #:    [id number]  Weight:       267.9 lb Date of Birth:  1941/08/19   BSA:          2.508  m Patient Age:    13 years    BP:           115/71 mmHg Patient Gender: M           HR:           80 bpm. Exam Location:  Inpatient Procedure: 2D Echo, Limited Echo and Intracardiac Opacification Agent Indications:    CHF  History:        Patient has prior history of Echocardiogram examinations. CAD,                 COPD; Risk Factors:Hypertension and Diabetes.  Sonographer:    Jyl Heinz Referring Phys: [id number] PAULA V ROSS IMPRESSIONS  1. Windows still challenging. With contrast, LV function is mildly reduced. Hypokinesis of the inferior, inferoseptal wall ; as well as anteroseptum as well. RV function is normal. IVC is normal and collapses RA pressure 3. FINDINGS  Left Ventricle: Windows still challenging. With contrast, LV function is mildly reduced. Hypokinesis of the inferior, inferoseptal wall ; as well as anteroseptum as well. RV function is normal. IVC is normal and collapses RA pressure 3.  LV Volumes (MOD) LV vol d, MOD A2C: 125.0 ml LV vol d, MOD A4C: 127.0 ml LV vol s, MOD A2C: 53.0 ml LV vol s, MOD A4C: 53.3 ml LV SV MOD A2C:     72.0 ml LV SV MOD A4C:     127.0 ml LV SV MOD BP:      75.6 ml IVC IVC diam: 1.30 cm Phineas Inches Electronically signed by Phineas Inches Signature Date/Time: 08/22/2021/1:49:49 PM    Final    US THORACENTESIS ASP PLEURAL SPACE W/IMG GUIDE  Result Date: 08/23/2021 INDICATION: Patient with a history of large B-cell lymphoma and hospital admission for respiratory failure secondary to COVID pneumonia. Patient found to have right pleural effusion. Interventional radiology asked to perform a diagnostic and therapeutic thoracentesis. EXAM: ULTRASOUND GUIDED THORACENTESIS MEDICATIONS: 1% lidocaine 10 mL COMPLICATIONS: None immediate. PROCEDURE: An ultrasound guided thoracentesis was thoroughly discussed with the patient and questions answered. The benefits, risks, alternatives and complications were also discussed. The patient understands and wishes to proceed with the procedure.  Written consent was obtained. Ultrasound was performed to localize and mark an adequate pocket of fluid in the right chest. The area was then prepped and draped in the normal sterile fashion. 1%  Lidocaine was used for local anesthesia. Under ultrasound guidance a 6 Fr Safe-T-Centesis catheter was introduced. Thoracentesis was performed. The catheter was removed and a dressing applied. FINDINGS: A total of approximately 500 mL of light brown fluid was removed. Samples were sent to the laboratory as requested by the clinical team. IMPRESSION: Successful ultrasound guided right thoracentesis yielding 500 mL of pleural fluid. Read by: Soyla Dryer, NP Electronically Signed   By: Markus Daft M.D.   On: 08/23/2021 15:45

## 2021-08-24 NOTE — Progress Notes (Signed)
Progress Note  Patient Name: Derek [name] Date of Encounter: 08/24/2021  [facility] Cardiologist: [name], MD   Subjective   He says he knows he will not get better.  He, but accepting.  He says his breathing is a little better today after the thoracentesis No chest pain  Inpatient Medications    Scheduled Meds:  (feeding supplement) PROSource Plus  30 mL Oral BID BM   Chlorhexidine Gluconate Cloth  6 each Topical Daily   feeding supplement  237 mL Oral BID BM   levothyroxine  25 mcg Oral Q0600   mouth rinse  15 mL Mouth Rinse BID   methylPREDNISolone (SOLU-MEDROL) injection  60 mg Intravenous Q12H   metoprolol succinate  12.5 mg Oral Daily   mometasone-formoterol  2 puff Inhalation BID   multivitamin with minerals  1 tablet Oral Daily   polyethylene glycol  17 g Oral Daily   rivaroxaban  15 mg Oral Q supper   rosuvastatin  10 mg Oral Daily   senna  1 tablet Oral BID   tamsulosin  0.4 mg Oral Daily   umeclidinium bromide  1 puff Inhalation Daily   Continuous Infusions:  sodium chloride Stopped (08/22/21 2319)   PRN Meds: sodium chloride, albuterol, benzonatate, ipratropium-albuterol, oxyCODONE   Vital Signs    Vitals:   08/24/21 0325 08/24/21 0400 08/24/21 0500 08/24/21 0600  BP:  133/66  128/69  Pulse: (!) 59  (!) 55 (!) 57  Resp: 20 15 17 19   Temp: 98 F (36.7 C)     TempSrc: Oral     SpO2: 94%  98% 96%  Weight:      Height:        Intake/Output Summary (Last 24 hours) at 08/24/2021 0800 Last data filed at 08/24/2021 0500 Gross per 24 hour  Intake 1746.5 ml  Output 450 ml  Net 1296.5 ml   Last 3 Weights 08/22/2021 08/19/2021 07/21/2021  Weight (lbs) 267 lb 13.7 oz 272 lb 277 lb  Weight (kg) 121.5 kg 123.378 kg 125.646 kg      Telemetry    Sinus rhythm, heart significant- Personally Reviewed  ECG    None today- Personally Reviewed  Physical Exam   GEN: Chronically ill-appearing elderly man, in respiratory distress Neck: No JVD seen,  difficult to assess Cardiac: RRR, no murmur, no rubs, or gallops.  Respiratory: diminished to auscultation bilaterally with rales in the bases. GI: Soft, nontender, non-distended  MS: No edema; No deformity. Neuro:  Nonfocal  Psych: Normal affect    Labs    High Sensitivity Troponin:  No results for input(s): TROPONINIHS in the last 720 hours.   Chemistry Recent Labs  Lab 07/25/2021 1551 08/20/21 0403 08/22/21 0409 08/23/21 0330 08/24/21 0343  NA 134*   < > 132* 132* 127*  K 4.4   < > 3.5 3.7 4.3  CL 104   < > 99 98 92*  CO2 19*   < > 23 23 23   GLUCOSE 190*   < > 220* 172* 391*  BUN 37*   < > 45* 55* 64*  CREATININE 2.13*   < > 2.05* 2.27* 2.55*  CALCIUM 8.4*   < > 7.7* 8.1* 8.0*  PROT 6.3*  --   --   --   --   ALBUMIN 2.7*  --   --   --   --   AST 33  --   --   --   --   ALT 15  --   --   --   --  ALKPHOS 62  --   --   --   --   BILITOT 0.8  --   --   --   --   GFRNONAA 31*   < > 32* 29* 25*  ANIONGAP 11   < > 10 11 12    < > = values in this interval not displayed.    Lipids No results for input(s): CHOL, TRIG, HDL, LABVLDL, LDLCALC, CHOLHDL in the last 168 hours.  Hematology Recent Labs  Lab 08/22/21 0409 08/23/21 0330 08/24/21 0343  WBC 1.8* 3.7* 5.3  RBC 3.85* 4.09* 3.77*  HGB 10.4* 11.0* 10.2*  HCT 32.7* 34.0* 31.7*  MCV 84.9 83.1 84.1  MCH 27.0 26.9 27.1  MCHC 31.8 32.4 32.2  RDW 16.5* 16.6* 16.5*  PLT 107* 104* 93*   Thyroid No results for input(s): TSH, FREET4 in the last 168 hours.  BNP Recent Labs  Lab 08/19/21 1300  BNP 221.4*    DDimer No results for input(s): DDIMER in the last 168 hours.   Radiology    DG Chest 1 View  Result Date: 08/23/2021 CLINICAL DATA:  Status post right thoracentesis EXAM: CHEST  1 VIEW COMPARISON:  08/22/2021 FINDINGS: Power port on the right has its tip at the SVC RA junction. There is some reduction of the amount of pleural fluid on the right. Moderate pleural effusion does persist with right lower lung  atelectasis and or pneumonia. Left lower lobe atelectasis remains visible as well. No pneumothorax. IMPRESSION: No pneumothorax following thoracentesis. Some persistent pleural fluid on the right with right lower lobe atelectasis and or pneumonia. Electronically Signed   By: Nelson Chimes M.D.   On: 08/23/2021 15:38   CT Chest High Resolution  Result Date: 08/23/2021 CLINICAL DATA:  80 year old male with history of shortness of breath. Evaluate for interstitial lung disease. EXAM: CT CHEST WITHOUT CONTRAST TECHNIQUE: Multidetector CT imaging of the chest was performed following the standard protocol without intravenous contrast. High resolution imaging of the lungs, as well as inspiratory and expiratory imaging, was performed. COMPARISON:  No priors. FINDINGS: Cardiovascular: Heart size is normal. There is no significant pericardial fluid, thickening or pericardial calcification. There is aortic atherosclerosis, as well as atherosclerosis of the great vessels of the mediastinum and the coronary arteries, including calcified atherosclerotic plaque in the left main, left anterior descending, left circumflex and right coronary arteries. Mild calcifications of the aortic valve. Right internal jugular single-lumen porta cath with tip terminating in the distal superior vena cava. Mediastinum/Nodes: No pathologically enlarged mediastinal or hilar lymph nodes. Please note that accurate exclusion of hilar adenopathy is limited on noncontrast CT scans. Esophagus is unremarkable in appearance. No axillary lymphadenopathy. Lungs/Pleura: High-resolution imaging is limited by presence of a moderate-sized right-sided pleural effusion which is associated with near complete atelectasis and consolidation of the right lower lobe. With these limitations in mind, in the well aerated portions of the lungs there are patchy areas of ground-glass attenuation, septal thickening, small amount of subpleural reticulation and thickening of  the peribronchovascular interstitium with some regional architectural distortion. No definite traction bronchiectasis or frank honeycombing confidently identified. Inspiratory and expiratory imaging is remarkable for very mild air trapping indicative of small airways disease. Upper Abdomen: Esophagus is unremarkable in appearance. No axillary lymphadenopathy. Musculoskeletal: There are no aggressive appearing lytic or blastic lesions noted in the visualized portions of the skeleton. IMPRESSION: 1. There is evidence of interstitial lung disease, with a spectrum of findings considered indeterminate for usual interstitial pneumonia (UIP) per  current ATS guidelines. Follow-up high-resolution chest CT is recommended in 12 months to assess for temporal changes in the appearance of the lung parenchyma. 2. Moderate right pleural effusion with a combination of airspace consolidation and atelectasis in the right lower lobe, concerning for right lower lobe pneumonia. 3. Aortic atherosclerosis, in addition to left main and 3 vessel coronary artery disease. Assessment for potential risk factor modification, dietary therapy or pharmacologic therapy may be warranted, if clinically indicated. 4. There are calcifications of the aortic valve. Echocardiographic correlation for evaluation of potential valvular dysfunction may be warranted if clinically indicated. Aortic Atherosclerosis (ICD10-I70.0). Electronically Signed   By: Vinnie Langton M.D.   On: 08/23/2021 08:18   DG CHEST PORT 1 VIEW  Result Date: 08/22/2021 CLINICAL DATA:  80 year old male with increase shortness of breath. 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 Small pleural effusion not feasible for thoracentesis on 08/19/2021. EXAM: PORTABLE CHEST 1 VIEW COMPARISON:  Chest CT 07/25/2021 and earlier. FINDINGS: Portable AP semi upright view at 0843 hours. Stable right chest Port-A-Cath, power port which is currently accessed. Mildly lower lung volumes. Progressive  failing and confluent opacity in the right lower lung and about the hilum. Stable visible mediastinal contours. Left lung appears stable. No pneumothorax or pulmonary edema. IMPRESSION: Progressive right airspace disease and/or pleural effusion since the CT on 08/14/2021. Repeat Chest Ultrasound for feasibility of thoracentesis now might be valuable. Lower lung volumes, left lung remains clear. Electronically Signed   By: Genevie Ann M.D.   On: 08/22/2021 08:57   ECHOCARDIOGRAM LIMITED  Result Date: 08/22/2021    ECHOCARDIOGRAM LIMITED REPORT   Patient Name:   Derek [name] Date of Exam: 08/22/2021 Medical Rec #:  [id number]   Height:       76.0 in Accession #:    [id number]  Weight:       267.9 lb Date of Birth:  [birth year]   BSA:          2.508 m Patient Age:    14 years    BP:           115/71 mmHg Patient Gender: M           HR:           80 bpm. Exam Location:  Inpatient Procedure: 2D Echo, Limited Echo and Intracardiac Opacification Agent Indications:    CHF  History:        Patient has prior history of Echocardiogram examinations. CAD,                 COPD; Risk Factors:Hypertension and Diabetes.  Sonographer:    Jyl Heinz Referring Phys: [id number] PAULA V ROSS IMPRESSIONS  1. Windows still challenging. With contrast, LV function is mildly reduced. Hypokinesis of the inferior, inferoseptal wall ; as well as anteroseptum as well. RV function is normal. IVC is normal and collapses RA pressure 3. FINDINGS  Left Ventricle: Windows still challenging. With contrast, LV function is mildly reduced. Hypokinesis of the inferior, inferoseptal wall ; as well as anteroseptum as well. RV function is normal. IVC is normal and collapses RA pressure 3.  LV Volumes (MOD) LV vol d, MOD A2C: 125.0 ml LV vol d, MOD A4C: 127.0 ml LV vol s, MOD A2C: 53.0 ml LV vol s, MOD A4C: 53.3 ml LV SV MOD A2C:     72.0 ml LV SV MOD A4C:     127.0 ml LV SV MOD BP:      75.6 ml IVC IVC diam:  1.30 cm Phineas Inches Electronically signed by Phineas Inches  Signature Date/Time: 08/22/2021/1:49:49 PM    Final    US THORACENTESIS ASP PLEURAL SPACE W/IMG GUIDE  Result Date: 08/23/2021 INDICATION: Patient with a history of large B-cell lymphoma and hospital admission for respiratory failure secondary to COVID pneumonia. Patient found to have right pleural effusion. Interventional radiology asked to perform a diagnostic and therapeutic thoracentesis. EXAM: ULTRASOUND GUIDED THORACENTESIS MEDICATIONS: 1% lidocaine 10 mL COMPLICATIONS: None immediate. PROCEDURE: An ultrasound guided thoracentesis was thoroughly discussed with the patient and questions answered. The benefits, risks, alternatives and complications were also discussed. The patient understands and wishes to proceed with the procedure. Written consent was obtained. Ultrasound was performed to localize and mark an adequate pocket of fluid in the right chest. The area was then prepped and draped in the normal sterile fashion. 1% Lidocaine was used for local anesthesia. Under ultrasound guidance a 6 Fr Safe-T-Centesis catheter was introduced. Thoracentesis was performed. The catheter was removed and a dressing applied. FINDINGS: A total of approximately 500 mL of light brown fluid was removed. Samples were sent to the laboratory as requested by the clinical team. IMPRESSION: Successful ultrasound guided right thoracentesis yielding 500 mL of pleural fluid. Read by: Soyla Dryer, NP Electronically Signed   By: Markus Daft M.D.   On: 08/23/2021 15:45    Cardiac Studies   ECHO: LIMITED 08/22/2021 IMPRESSIONS   1. Windows still challenging. With contrast, LV function is mildly  reduced. Hypokinesis of the inferior, inferoseptal wall ; as well as  anteroseptum as well. RV function is normal. IVC is normal and collapses  RA pressure 3.   ECHO: LIMITED 08/21/2021  1. Poor acoustic windows limt study LVEF may be mildly depressed REcomm limited echo with Defiinity to confirm wall motion Images suggestive that  inferior/inferoseptal walls are hypokinetic . There is moderate left ventricular hypertrophy.   2. Right ventricular systolic function is low normal. The right  ventricular size is mildly enlarged. There is normal pulmonary artery  systolic pressure.   3. MR is eccentric, directed posterior into LA. Probable moderate .  Moderate mitral valve regurgitation.   4. The aortic valve is tricuspid. Aortic valve regurgitation is not  visualized. Aortic valve sclerosis/calcification is present, without any  evidence of aortic stenosis.   5. Aortic dilatation noted. There is mild dilatation of the aortic root,  measuring 41 mm. There is mild dilatation of the ascending aorta,  measuring 38 mm.   6. The inferior vena cava is normal in size with greater than 50%  respiratory variability, suggesting right atrial pressure of 3 mmHg.   ECHO: 06/07/2021  1. Left ventricular ejection fraction, by estimation, is 55 to 60%. The  left ventricle has normal function. The left ventricle has no regional  wall motion abnormalities. There is mild left ventricular hypertrophy.  Left ventricular diastolic parameters  are indeterminate.   2. Right ventricular systolic function is normal. The right ventricular  size is normal.   3. The mitral valve is normal in structure. Trivial mitral valve  regurgitation.   4. The aortic valve is normal in structure. Aortic valve regurgitation is not visualized. No aortic stenosis is present.  Patient Profile     80 y.o. male  with a history of hypertension, CAD (prior cath 85 to 95% diagonal branch, occluded OM 2.  Plan for medical therapy), chronic kidney disease, type 2 diabetes, hyperlipidemia with intolerance to Lipitor, atrial flutter/fib.  The patient has had multiple previous cardioversions (  2019, 2020, 2021, March 2022).  His CHA2DS2-VASc score is 5.  He he is maintained on amiodarone.  In August 2022 it was increased to 200 twice daily with plan for repeat  cardioversion.  PET scan in September showed a hypermetabolic right upper lobe lung mass was decreasing in size patchy groundglass appearance in both lungs concern for drug reaction.  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  Treated with prednisone chronically.  Note he has remained COVID-positive since infection in February.  Patient has been off chemotherapy for 3 months due to persistent respiratory problems and possible infection.  Note 60 pound weight loss over 8 months due to decreased appetite.   The patient presented to [facility] on 11/30 with complaints of increaed SOB for 3 months  On admit, the patient was hypoxic.  Sats on room air 85%.  Lab work showed pancytopenia.  CT scan showed a moderate right pleural effusion diffuse right lung peripheral solid and groundglass nodular densities, question infectious versus neoplastic.  Started on empiric antibiotics.  IV Lasix also started due to shortness of breath and bilateral lower extremity edema.  On 12/04, Cards asked to see.  Assessment & Plan    Acute CHF w/ pleural effusions -  s/p t-centesis 12/05 w/ 500 cc out - I/O NET -33 ml since admit, only 450 cc UOP 12/05 -Lasix was DC'd yesterday -Creatinine is up a little higher, so we will need to hold the Lasix -Continue high flow oxygen  2.  History of atrial fibrillation: - amio 200 mg qd held as of 12/04, concern for contributing to lung probs - since Cr higher, Xarelto now 15 mg/day -Continues to maintain sinus rhythm  3.  Pneumonitis with acute hypoxic respiratory failure -He was seen by oncology and given IVIG -Continues to test positive for COVID -Management per IM, Oncology and CCM - He has been made a DNR

## 2021-08-24 NOTE — Progress Notes (Addendum)
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  Dyspnea had been going on for last 3 months.    Patient's reported fever of 103 Fahrenheit at home.  He was diagnosed with COVID back in February but continues to be positive.  He is off chemotherapy for the last 3 months due to persistent respiratory issues, history of recent weight loss.  On presentation he was afebrile, normotensive but hypoxic on room air with 85% saturation.  He was placed on 4 L of nasal cannula.  Lab work showed pancytopenia .CT chest showed new moderate right pleural effusion, diffuse right peripheral solid and groundglass nodular densities representing infectious versus neoplastic process.  Started on azithromycin and ceftriaxone for pneumonia.  Also started on Lasix IV for severe volume overload, echocardiogram ordered to rule out CHF.  Hospital course remarkable for worsening respiratory status, requiring 15 L of oxygen in the AM of 08/22/21.  Patient  transferred to stepdown.  PCCM and cardiology consulted and following.Oncology also following.  Palliative care also consulted for goals of care.  Assessment & Plan:   Principal Problem:   Acute respiratory failure with hypoxia (HCC) Active Problems:   Diabetes mellitus, type II (HCC)   Diffuse large B cell lymphoma (HCC)   Thrombocytopenia (HCC)   Chronic kidney disease (CKD), 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HCC)   PAF (paroxysmal atrial fibrillation) (HCC)   COPD (chronic obstructive pulmonary disease) ([place])   Community acquired pneumonia   Pleural effusion   Leukopenia   Neutropenia (HCC)    Hypothyroidism   Acute respiratory acidosis (HCC)   Malnutrition of moderate degree   Pressure injury of skin   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HCC)   Acute hypoxic respiratory failure: Hypoxic on presentation.  Thought to be secondary to possible right-sided pneumonia/right-sided pleural effusion vs radiation/covid pneumonitis versus progression of lymphoma.  Also has history of COPD/follows with Dr. [name]. Takes prednisone chronically.  CT chest also showed diffuse right peripheral solid and groundglass nodular densities representing infectious versus neoplastic process.Started on management for community-acquired pneumonia with  Rocephin( day 7/7), azithromycin ( finished course) He is not on home oxygen.   Patient decompensated requiring more oxygen on 08/22/21.  Chest x-ray showed radiopaque density in the right lower lobe could be progression  of  lymphoma versus pleural effusion.  High-resolution CT scan showed findings of interstitial lung disease.  Also showed moderate right pleural effusion with atelectasis of the lower lobe. PCCM following here.  PCCM ordered autoimmune work-up, also started on Solu-Medrol. On 15 L of oxygen this morning. He feels better today.  We will continue to try to  taper the oxygen.  Lymphoma/hypogammaglobinemia: Oncology following.  His pulmonary findings could be secondary to progression of lymphoma.  Oncology ordered IVIG  Congestive heart failure/volume overload: Had severe bilateral lower extremity edema. Elevated  BNP, started on  Lasix 80 mg IV twice a day.  Echo done here showed inferior/inferoseptal wall hypokinesis, moderate left ventricular hypertrophy, low normal right ventricular systolic function, moderate mitral valve regurgitation.  Cardiology consulted and following.lasix on hold now.  Currently appears near euvolemic status  Pancytopenia: Secondary to history of large B-cell lymphoma/chemotherapy.   He follows with  Dr. Alvy Bimler.  Oncology following  here.  On Granix for low ANC,now D/ced.  Continue to monitor CBC  History of paroxysmal A. fib/a flutter: On Xarelto which will be continued.  He was also on amiodarone , now on held due to findings on the CT.  On metoprolol.  History of coronary artery disease: On statin, beta-blocker  Diabetes type 2: Monitor blood sugars.  Continue sliding scale  insulin for now.  Hypothyroidism: On levothyroxine  CKD stage IV: Creatinine mildly trended up today..  Baseline creatinine around 2.2.  Continue to monitor.  Avoid nephrotoxins  Hyponatremia: Likely secondary from overdiuresis.  Will check random urine sodium.  Check BMP tomorrow  Debility/deconditioning/weakness: Has been very weak recently.    Lives with his son at home.  PT/OT recommended skilled nursing facility on DC.TOC consulted   Obesity: BMI 32.6  Goals of care:80 year old male who presents with acute hypoxic respiratory failure, found to have progression of lymphoma, currently on 15 L of oxygen.  Poor prognosis.  Goals of care discussed at the bedside.  Code changed to DNR/DNI as per patient's wishes.  Palliative care consulted  Nutrition Problem: Moderate Malnutrition Etiology: chronic illness, cancer and cancer related treatments      DVT prophylaxis:Xarelto Code Status: Full Family Communication: Discussed with significant other at bedside on 08/23/21 Patient status:Inpatient  Dispo: The patient is from: Home              Anticipated d/c is to: SNF vs HH              Anticipated d/c date is: Not sure for now  Consultants: Oncology  Procedures:None  Antimicrobials:  Anti-infectives (From admission, onward)    Start     Dose/Rate Route Frequency Ordered Stop   08/19/21 2200  cefTRIAXone (ROCEPHIN) 2 g in sodium chloride 0.9 % 100 mL IVPB        2 g 200 mL/hr over 30 Minutes Intravenous Every 24 hours 07/27/2021 2314 08/23/21 2209   08/19/21 2000  azithromycin (ZITHROMAX) 500 mg in sodium chloride 0.9 % 250 mL IVPB         500 mg 250 mL/hr over 60 Minutes Intravenous Every 24 hours 08/12/2021 2314 08/23/21 2103   08/17/2021 2030  cefTRIAXone (ROCEPHIN) 1 g in sodium chloride 0.9 % 100 mL IVPB        1 g 200 mL/hr over 30 Minutes Intravenous  Once 07/28/2021 2018 08/17/2021 2117   08/05/2021 2030  azithromycin (ZITHROMAX) 500 mg in sodium chloride 0.9 % 250 mL IVPB        500 mg 250 mL/hr over 60 Minutes Intravenous  Once 08/06/2021 2018 08/17/2021 2321       Subjective:  Patient seen and examined at the bedside this morning.  Still on 15 L of oxygen high flow.  He looks slightly better than yesterday.  Does not look dyspneic or in distress while at rest. No new complaints Objective: Vitals:   08/24/21 0325 08/24/21 0400 08/24/21 0500 08/24/21 0600  BP:  133/66  128/69  Pulse: (!) 59  (!) 55 (!) 57  Resp: 20 15 17 19   Temp: 98 F (36.7 C)     TempSrc: Oral     SpO2: 94%  98% 96%  Weight:      Height:        Intake/Output Summary (Last 24 hours) at 08/24/2021 0723 Last data filed at 08/24/2021 0500 Gross per 24 hour  Intake 1746.5 ml  Output 450 ml  Net 1296.5 ml   Filed Weights   08/19/21 1526 08/22/21 1005  Weight: 123.4 kg 121.5 kg    Examination:  General exam: Chronically ill looking, weak, obese HEENT: PERRL Respiratory system: Diminished air sounds bilaterally but no crackles or wheezes Cardiovascular system: S1 & S2 heard, RRR.  [place]o-[place] Gastrointestinal system: Abdomen is distended, soft and nontender. Central nervous system: Alert and oriented Extremities: No edema, no clubbing ,no cyanosis Skin: Scattered ecchymosis, no ulcers,no icterus     Data Reviewed: I have personally reviewed following labs and imaging studies  CBC: Recent Labs  Lab 08/20/21 0403 08/21/21 0432 08/22/21 0409 08/23/21 0330 08/24/21 0343  WBC 1.2* 0.9* 1.8* 3.7* 5.3  NEUTROABS 0.4* 0.2* 0.9* 2.2 4.5  HGB 10.3* 10.2* 10.4* 11.0* 10.2*  HCT 32.3* 31.8* 32.7* 34.0* 31.7*  MCV 85.2 85.5 84.9 83.1 84.1   PLT 124* 115* 107* 104* 93*   Basic Metabolic Panel: Recent Labs  Lab 08/20/21 0403 08/21/21 0432 08/22/21 0409 08/23/21 0330 08/24/21 0343  NA 135 132* 132* 132* 127*  K 3.5 3.5 3.5 3.7 4.3  CL 104 101 99 98 92*  CO2 22 24 23 23 23   GLUCOSE 109* 139* 220* 172* 391*  BUN 35* 41* 45* 55* 64*  CREATININE 1.98* 2.09* 2.05* 2.27* 2.55*  CALCIUM 7.9* 7.7* 7.7* 8.1* 8.0*   GFR: Estimated Creatinine Clearance: 33.5 mL/min (A) (by C-G formula based on SCr of 2.55 mg/dL (H)). Liver Function Tests: Recent Labs  Lab 07/31/2021 1551  AST 33  ALT 15  ALKPHOS 62  BILITOT 0.8  PROT 6.3*  ALBUMIN 2.7*   No results for input(s): LIPASE, AMYLASE in the last 168 hours. No results for input(s): AMMONIA in the last 168 hours. Coagulation Profile: No results for input(s): INR, PROTIME in the last 168 hours. Cardiac Enzymes: No results for input(s): CKTOTAL, CKMB, CKMBINDEX, TROPONINI in the last 168 hours. BNP (last 3 results) No results for input(s): PROBNP in the last 8760 hours. HbA1C: No results for input(s): HGBA1C in the last 72 hours. CBG: No results for input(s): GLUCAP in the last 168 hours. Lipid Profile: No results for input(s): CHOL, HDL, LDLCALC, TRIG, CHOLHDL, LDLDIRECT in the last 72 hours. Thyroid Function Tests: No results for input(s): TSH, T4TOTAL, FREET4, T3FREE, THYROIDAB in the last 72 hours. Anemia Panel: No results for input(s): VITAMINB12, FOLATE, FERRITIN, TIBC, IRON, RETICCTPCT in the last 72 hours. Sepsis Labs: Recent Labs  Lab 08/17/2021 2206 08/22/21 1340 08/23/21 0330 08/24/21 0343  PROCALCITON  --  0.21 0.33 0.43  LATICACIDVEN 1.3  --   --   --     Recent Results (from the past 240 hour(s))  Resp Panel by RT-PCR (Flu A&B, Covid) Nasopharyngeal Swab     Status: Abnormal   Collection Time: 07/20/2021  3:52 PM   Specimen: Nasopharyngeal Swab; Nasopharyngeal(NP) swabs in vial transport medium  Result Value Ref Range Status   SARS Coronavirus 2 by RT  PCR POSITIVE (A) NEGATIVE Final    Comment: CRITICAL RESULT CALLED TO, READ BACK BY AND VERIFIED WITH: RN N WHEATLEY AT 1957 08/13/2021 CRUICKSHANK A (NOTE) SARS-CoV-2 target nucleic acids are DETECTED.  The SARS-CoV-2 RNA is generally detectable in upper respiratory specimens during the acute phase of infection. Positive results are indicative of the presence of the identified virus, but do not rule out bacterial infection or co-infection with other pathogens not detected by the test. Clinical correlation with patient history and other diagnostic information is necessary to determine patient infection  status. The expected result is Negative.  Fact Sheet for Patients: EntrepreneurPulse.com.au  Fact Sheet for Healthcare Providers: IncredibleEmployment.be  This test is not yet approved or cleared by the [place] FDA and  has been authorized for detection and/or diagnosis of SARS-CoV-2 by FDA under an Emergency Use Authorization (EUA).  This EUA will remain in effect (mean ing this test can be used) for the duration of  the COVID-19 declaration under Section 564(b)(1) of the Act, 21 U.S.C. section 360bbb-3(b)(1), unless the authorization is terminated or revoked sooner.     Influenza A by PCR NEGATIVE NEGATIVE Final   Influenza B by PCR NEGATIVE NEGATIVE Final    Comment: (NOTE) The Xpert Xpress SARS-CoV-2/FLU/RSV plus assay is intended as an aid in the diagnosis of influenza from Nasopharyngeal swab specimens and should not be used as a sole basis for treatment. Nasal washings and aspirates are unacceptable for Xpert Xpress SARS-CoV-2/FLU/RSV testing.  Fact Sheet for Patients: EntrepreneurPulse.com.au  Fact Sheet for Healthcare Providers: IncredibleEmployment.be  This test is not yet approved or cleared by the [place] FDA and has been authorized for detection and/or diagnosis of SARS-CoV-2  by FDA under an Emergency Use Authorization (EUA). This EUA will remain in effect (meaning this test can be used) for the duration of the COVID-19 declaration under Section 564(b)(1) of the Act, 21 U.S.C. section 360bbb-3(b)(1), unless the authorization is terminated or revoked.  Performed at [facility], [place] [address] [place], [place] [address]   Culture, blood (Routine X 2) w Reflex to ID Panel     Status: None (Preliminary result)   Collection Time: 08/06/2021 10:02 PM   Specimen: BLOOD  Result Value Ref Range Status   Specimen Description   Final    BLOOD PORTA CATH Performed at [facility] [address] [place], [place] [address]    Special Requests   Final    BOTTLES DRAWN AEROBIC AND ANAEROBIC Blood Culture adequate volume Performed at [facility] [address] [place], [place] [address]    Culture   Final    NO GROWTH 4 DAYS Performed at [facility], [place] [address], [place], [place] [address]    Report Status PENDING  Incomplete  Culture, blood (Routine X 2) w Reflex to ID Panel     Status: None (Preliminary result)   Collection Time: 08/19/21  4:24 AM   Specimen: BLOOD  Result Value Ref Range Status   Specimen Description   Final    BLOOD PORTA CATH Performed at [facility] [address], [place], [place] [address]    Special Requests   Final    BOTTLES DRAWN AEROBIC AND ANAEROBIC Blood Culture results may not be optimal due to an excessive volume of blood received in culture bottles Performed at [facility] [address] [place], [place] [address]    Culture   Final    NO GROWTH 4 DAYS Performed at [facility], [place] [address], [place],  [address]    Report Status PENDING  Incomplete  MRSA Next Gen by PCR, Nasal     Status: None   Collection Time: 08/22/21  9:27 AM   Specimen: Nasal Mucosa; Nasal Swab  Result Value Ref Range Status    MRSA by PCR Next Gen NOT DETECTED NOT DETECTED Final    Comment: (NOTE) The GeneXpert MRSA Assay (FDA approved for NASAL specimens only), is one component of a comprehensive MRSA  colonization surveillance program. It is not intended to diagnose MRSA infection nor to guide or monitor treatment for MRSA infections. Test performance is not FDA approved in patients less than 75 years old. Performed at [facility], [place] [address] [place], [place] [address]   Respiratory (~20 pathogens) panel by PCR     Status: None   Collection Time: 08/22/21  1:40 PM   Specimen: Nasopharyngeal Swab; Respiratory  Result Value Ref Range Status   Adenovirus NOT DETECTED NOT DETECTED Final   Coronavirus 229E NOT DETECTED NOT DETECTED Final    Comment: (NOTE) The Coronavirus on the Respiratory Panel, DOES NOT test for the novel  Coronavirus (2019 nCoV)    Coronavirus HKU1 NOT DETECTED NOT DETECTED Final   Coronavirus NL63 NOT DETECTED NOT DETECTED Final   Coronavirus OC43 NOT DETECTED NOT DETECTED Final   Metapneumovirus NOT DETECTED NOT DETECTED Final   Rhinovirus / Enterovirus NOT DETECTED NOT DETECTED Final   Influenza A NOT DETECTED NOT DETECTED Final   Influenza B NOT DETECTED NOT DETECTED Final   Parainfluenza Virus 1 NOT DETECTED NOT DETECTED Final   Parainfluenza Virus 2 NOT DETECTED NOT DETECTED Final   Parainfluenza Virus 3 NOT DETECTED NOT DETECTED Final   Parainfluenza Virus 4 NOT DETECTED NOT DETECTED Final   Respiratory Syncytial Virus NOT DETECTED NOT DETECTED Final   Bordetella pertussis NOT DETECTED NOT DETECTED Final   Bordetella Parapertussis NOT DETECTED NOT DETECTED Final   Chlamydophila pneumoniae NOT DETECTED NOT DETECTED Final   Mycoplasma pneumoniae NOT DETECTED NOT DETECTED Final    Comment: Performed at [facility], [place]. [address] [place], [place] [address]  Body fluid culture w Gram Stain     Status: None (Preliminary result)   Collection Time:  08/23/21  3:04 PM   Specimen: PATH Cytology Pleural fluid  Result Value Ref Range Status   Specimen Description   Final    PLEURAL Performed at [facility] [address], [place], [place] [address]    Special Requests   Final    NONE Performed at [facility], [place] [address] [place], [place] [address]    Gram Stain   Final    RARE WBC PRESENT, PREDOMINANTLY MONONUCLEAR NO ORGANISMS SEEN Performed at [facility], [place] [address] [place], [place] [address]    Culture PENDING  Incomplete   Report Status PENDING  Incomplete         Radiology Studies: DG Chest 1 View  Result Date: 08/23/2021 CLINICAL DATA:  Status post right thoracentesis EXAM: CHEST  1 VIEW COMPARISON:  08/22/2021 FINDINGS: Power port on the right has its tip at the SVC RA junction. There is some reduction of the amount of pleural fluid on the right. Moderate pleural effusion does persist with right lower lung atelectasis and or pneumonia. Left lower lobe atelectasis remains visible as well. No pneumothorax. IMPRESSION: No pneumothorax following thoracentesis. Some persistent pleural fluid on the right with right lower lobe atelectasis and or pneumonia. Electronically Signed   By: Nelson Chimes M.D.   On: 08/23/2021 15:38   CT Chest High Resolution  Result Date: 08/23/2021 CLINICAL DATA:  80 year old male with history of shortness of breath. Evaluate for interstitial lung disease. EXAM: CT CHEST WITHOUT CONTRAST TECHNIQUE: Multidetector CT imaging of the chest was performed following the standard protocol without intravenous contrast. High resolution imaging of the lungs, as well as inspiratory and expiratory imaging, was performed. COMPARISON:  No priors.  FINDINGS: Cardiovascular: Heart size is normal. There is no significant pericardial fluid, thickening or pericardial calcification. There is aortic atherosclerosis, as well as atherosclerosis of the great vessels of the  mediastinum and the coronary arteries, including calcified atherosclerotic plaque in the left main, left anterior descending, left circumflex and right coronary arteries. Mild calcifications of the aortic valve. Right internal jugular single-lumen porta cath with tip terminating in the distal superior vena cava. Mediastinum/Nodes: No pathologically enlarged mediastinal or hilar lymph nodes. Please note that accurate exclusion of hilar adenopathy is limited on noncontrast CT scans. Esophagus is unremarkable in appearance. No axillary lymphadenopathy. Lungs/Pleura: High-resolution imaging is limited by presence of a moderate-sized right-sided pleural effusion which is associated with near complete atelectasis and consolidation of the right lower lobe. With these limitations in mind, in the well aerated portions of the lungs there are patchy areas of ground-glass attenuation, septal thickening, small amount of subpleural reticulation and thickening of the peribronchovascular interstitium with some regional architectural distortion. No definite traction bronchiectasis or frank honeycombing confidently identified. Inspiratory and expiratory imaging is remarkable for very mild air trapping indicative of small airways disease. Upper Abdomen: Esophagus is unremarkable in appearance. No axillary lymphadenopathy. Musculoskeletal: There are no aggressive appearing lytic or blastic lesions noted in the visualized portions of the skeleton. IMPRESSION: 1. There is evidence of interstitial lung disease, with a spectrum of findings considered indeterminate for usual interstitial pneumonia (UIP) per current ATS guidelines. Follow-up high-resolution chest CT is recommended in 12 months to assess for temporal changes in the appearance of the lung parenchyma. 2. Moderate right pleural effusion with a combination of airspace consolidation and atelectasis in the right lower lobe, concerning for right lower lobe pneumonia. 3. Aortic  atherosclerosis, in addition to left main and 3 vessel coronary artery disease. Assessment for potential risk factor modification, dietary therapy or pharmacologic therapy may be warranted, if clinically indicated. 4. There are calcifications of the aortic valve. Echocardiographic correlation for evaluation of potential valvular dysfunction may be warranted if clinically indicated. Aortic Atherosclerosis (ICD10-I70.0). Electronically Signed   By: Vinnie Langton M.D.   On: 08/23/2021 08:18   DG CHEST PORT 1 VIEW  Result Date: 08/22/2021 CLINICAL DATA:  80 year old male with increase shortness of breath. 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 Small pleural effusion not feasible for thoracentesis on 08/19/2021. EXAM: PORTABLE CHEST 1 VIEW COMPARISON:  Chest CT 07/27/2021 and earlier. FINDINGS: Portable AP semi upright view at 0843 hours. Stable right chest Port-A-Cath, power port which is currently accessed. Mildly lower lung volumes. Progressive failing and confluent opacity in the right lower lung and about the hilum. Stable visible mediastinal contours. Left lung appears stable. No pneumothorax or pulmonary edema. IMPRESSION: Progressive right airspace disease and/or pleural effusion since the CT on 08/13/2021. Repeat Chest Ultrasound for feasibility of thoracentesis now might be valuable. Lower lung volumes, left lung remains clear. Electronically Signed   By: Genevie Ann M.D.   On: 08/22/2021 08:57   ECHOCARDIOGRAM LIMITED  Result Date: 08/22/2021    ECHOCARDIOGRAM LIMITED REPORT   Patient Name:   PRATHAM CASSATT Date of Exam: 08/22/2021 Medical Rec #:  [id number]   Height:       76.0 in Accession #:    [id number]  Weight:       267.9 lb Date of Birth:  1941/03/09   BSA:          2.508 m Patient Age:    4 years    BP:  115/71 mmHg Patient Gender: M           HR:           80 bpm. Exam Location:  Inpatient Procedure: 2D Echo, Limited Echo and Intracardiac Opacification Agent Indications:    CHF  History:         Patient has prior history of Echocardiogram examinations. CAD,                 COPD; Risk Factors:Hypertension and Diabetes.  Sonographer:    Jyl Heinz Referring Phys: [id number] PAULA V ROSS IMPRESSIONS  1. Windows still challenging. With contrast, LV function is mildly reduced. Hypokinesis of the inferior, inferoseptal wall ; as well as anteroseptum as well. RV function is normal. IVC is normal and collapses RA pressure 3. FINDINGS  Left Ventricle: Windows still challenging. With contrast, LV function is mildly reduced. Hypokinesis of the inferior, inferoseptal wall ; as well as anteroseptum as well. RV function is normal. IVC is normal and collapses RA pressure 3.  LV Volumes (MOD) LV vol d, MOD A2C: 125.0 ml LV vol d, MOD A4C: 127.0 ml LV vol s, MOD A2C: 53.0 ml LV vol s, MOD A4C: 53.3 ml LV SV MOD A2C:     72.0 ml LV SV MOD A4C:     127.0 ml LV SV MOD BP:      75.6 ml IVC IVC diam: 1.30 cm Derek [name] Electronically signed by Derek [name] Signature Date/Time: 08/22/2021/1:49:49 PM    Final    US THORACENTESIS ASP PLEURAL SPACE W/IMG GUIDE  Result Date: 08/23/2021 INDICATION: Patient with a history of large B-cell lymphoma and hospital admission for respiratory failure secondary to COVID pneumonia. Patient found to have right pleural effusion. Interventional radiology asked to perform a diagnostic and therapeutic thoracentesis. EXAM: ULTRASOUND GUIDED THORACENTESIS MEDICATIONS: 1% lidocaine 10 mL COMPLICATIONS: None immediate. PROCEDURE: An ultrasound guided thoracentesis was thoroughly discussed with the patient and questions answered. The benefits, risks, alternatives and complications were also discussed. The patient understands and wishes to proceed with the procedure. Written consent was obtained. Ultrasound was performed to localize and mark an adequate pocket of fluid in the right chest. The area was then prepped and draped in the normal sterile fashion. 1% Lidocaine was used for local anesthesia.  Under ultrasound guidance a 6 Fr Safe-T-Centesis catheter was introduced. Thoracentesis was performed. The catheter was removed and a dressing applied. FINDINGS: A total of approximately 500 mL of light brown fluid was removed. Samples were sent to the laboratory as requested by the clinical team. IMPRESSION: Successful ultrasound guided right thoracentesis yielding 500 mL of pleural fluid. Read by: Soyla Dryer, NP Electronically Signed   By: Markus Daft M.D.   On: 08/23/2021 15:45        Scheduled Meds:  (feeding supplement) PROSource Plus  30 mL Oral BID BM   Chlorhexidine Gluconate Cloth  6 each Topical Daily   feeding supplement  237 mL Oral BID BM   levothyroxine  25 mcg Oral Q0600   mouth rinse  15 mL Mouth Rinse BID   methylPREDNISolone (SOLU-MEDROL) injection  60 mg Intravenous Q12H   metoprolol succinate  12.5 mg Oral Daily   mometasone-formoterol  2 puff Inhalation BID   multivitamin with minerals  1 tablet Oral Daily   polyethylene glycol  17 g Oral Daily   rivaroxaban  15 mg Oral Q supper   rosuvastatin  10 mg Oral Daily   senna  1 tablet Oral BID  tamsulosin  0.4 mg Oral Daily   umeclidinium bromide  1 puff Inhalation Daily   Continuous Infusions:  sodium chloride Stopped (08/22/21 2319)     LOS: 6 days    Time spent:35 mins. More than 50% of that time was spent in counseling and/or coordination of care.      Shelly Coss, MD [facility] P12/02/2021, 7:23 AM

## 2021-08-25 ENCOUNTER — Ambulatory Visit: Payer: Medicare Other

## 2021-08-25 DIAGNOSIS — J189 Pneumonia, unspecified organism: Secondary | ICD-10-CM | POA: Diagnosis not present

## 2021-08-25 DIAGNOSIS — Z7189 Other specified counseling: Secondary | ICD-10-CM

## 2021-08-25 DIAGNOSIS — C8330 Diffuse large B-cell lymphoma, unspecified site: Secondary | ICD-10-CM

## 2021-08-25 DIAGNOSIS — Z66 Do not resuscitate: Secondary | ICD-10-CM

## 2021-08-25 DIAGNOSIS — B370 Candidal stomatitis: Secondary | ICD-10-CM

## 2021-08-25 DIAGNOSIS — Z515 Encounter for palliative care: Secondary | ICD-10-CM

## 2021-08-25 DIAGNOSIS — J9601 Acute respiratory failure with hypoxia: Secondary | ICD-10-CM | POA: Diagnosis not present

## 2021-08-25 DIAGNOSIS — N184 Chronic kidney disease, stage 4 (severe): Secondary | ICD-10-CM | POA: Diagnosis not present

## 2021-08-25 LAB — BASIC METABOLIC PANEL
Anion gap: 14 (ref 5–15)
Anion gap: 14 (ref 5–15)
BUN: 88 mg/dL — ABNORMAL HIGH (ref 8–23)
BUN: 91 mg/dL — ABNORMAL HIGH (ref 8–23)
CO2: 21 mmol/L — ABNORMAL LOW (ref 22–32)
CO2: 21 mmol/L — ABNORMAL LOW (ref 22–32)
Calcium: 8.1 mg/dL — ABNORMAL LOW (ref 8.9–10.3)
Calcium: 8.2 mg/dL — ABNORMAL LOW (ref 8.9–10.3)
Chloride: 94 mmol/L — ABNORMAL LOW (ref 98–111)
Chloride: 93 mmol/L — ABNORMAL LOW (ref 98–111)
Creatinine, Ser: 3.38 mg/dL — ABNORMAL HIGH (ref 0.61–1.24)
Creatinine, Ser: 3.58 mg/dL — ABNORMAL HIGH (ref 0.61–1.24)
GFR, Estimated: 18 mL/min — ABNORMAL LOW (ref >60)
GFR, Estimated: 17 mL/min — ABNORMAL LOW (ref >60)
Glucose, Bld: 511 mg/dL (ref 70–99)
Glucose, Bld: 424 mg/dL — ABNORMAL HIGH (ref 70–99)
Potassium: 4.6 mmol/L (ref 3.5–5.1)
Potassium: 4.2 mmol/L (ref 3.5–5.1)
Sodium: 129 mmol/L — ABNORMAL LOW (ref 135–145)
Sodium: 128 mmol/L — ABNORMAL LOW (ref 135–145)

## 2021-08-25 LAB — ANCA TITERS
Atypical P-ANCA titer: <1:20 {titer}
C-ANCA: <1:20 {titer}
P-ANCA: <1:20 {titer}

## 2021-08-25 LAB — GLUCOSE, CAPILLARY
Glucose-Capillary: 473 mg/dL — ABNORMAL HIGH (ref 70–99)
Glucose-Capillary: 459 mg/dL — ABNORMAL HIGH (ref 70–99)
Glucose-Capillary: 424 mg/dL — ABNORMAL HIGH (ref 70–99)
Glucose-Capillary: 416 mg/dL — ABNORMAL HIGH (ref 70–99)
Glucose-Capillary: 396 mg/dL — ABNORMAL HIGH (ref 70–99)
Glucose-Capillary: 379 mg/dL — ABNORMAL HIGH (ref 70–99)
Glucose-Capillary: 285 mg/dL — ABNORMAL HIGH (ref 70–99)
Glucose-Capillary: 292 mg/dL — ABNORMAL HIGH (ref 70–99)
Glucose-Capillary: 239 mg/dL — ABNORMAL HIGH (ref 70–99)

## 2021-08-25 LAB — ANTINUCLEAR ANTIBODIES, IFA: ANA Ab, IFA: NEGATIVE

## 2021-08-25 LAB — QUANTIFERON-TB GOLD PLUS: QuantiFERON-TB Gold Plus: NEGATIVE

## 2021-08-25 LAB — QUANTIFERON-TB GOLD PLUS (RQFGPL)
QuantiFERON Mitogen Value: 1.86 IU/mL
QuantiFERON Nil Value: 0.1 IU/mL
QuantiFERON TB1 Ag Value: 0.1 IU/mL
QuantiFERON TB2 Ag Value: 0.1 IU/mL

## 2021-08-25 LAB — CYTOLOGY - NON PAP

## 2021-08-25 LAB — SURGICAL PATHOLOGY

## 2021-08-25 LAB — LEGIONELLA PNEUMOPHILA SEROGP 1 UR AG

## 2021-08-25 MED ORDER — INSULIN ASPART 100 UNIT/ML IJ SOLN
0.0000 [IU] | Freq: Three times a day (TID) | INTRAMUSCULAR | Status: DC
  Administered 2021-08-25: 20 [IU] via SUBCUTANEOUS

## 2021-08-25 MED ORDER — INSULIN ASPART 100 UNIT/ML IJ SOLN: 5.0000 [IU] | Freq: Three times a day (TID) | INTRAMUSCULAR | Status: DC

## 2021-08-25 MED ORDER — INSULIN ASPART 100 UNIT/ML IJ SOLN
15.0000 [IU] | Freq: Once | INTRAMUSCULAR | Status: AC
  Administered 2021-08-25: 15 [IU] via SUBCUTANEOUS

## 2021-08-25 MED ORDER — METOPROLOL TARTRATE 5 MG/5ML IV SOLN
2.5000 mg | INTRAVENOUS | Status: DC | PRN
  Administered 2021-08-25: 2.5 mg via INTRAVENOUS
  Filled 2021-08-25: qty 5

## 2021-08-25 MED ORDER — INSULIN ASPART 100 UNIT/ML IJ SOLN
0.0000 [IU] | INTRAMUSCULAR | Status: DC
  Administered 2021-08-25 (×2): 20 [IU] via SUBCUTANEOUS
  Administered 2021-08-25: 11 [IU] via SUBCUTANEOUS
  Administered 2021-08-26 (×2): 4 [IU] via SUBCUTANEOUS
  Administered 2021-08-26: 7 [IU] via SUBCUTANEOUS
  Administered 2021-08-26: 5 [IU] via SUBCUTANEOUS

## 2021-08-25 MED ORDER — INSULIN GLARGINE-YFGN 100 UNIT/ML ~~LOC~~ SOLN
20.0000 [IU] | Freq: Two times a day (BID) | SUBCUTANEOUS | Status: DC
  Administered 2021-08-25 – 2021-08-26 (×2): 20 [IU] via SUBCUTANEOUS
  Filled 2021-08-25 (×4): qty 0.2

## 2021-08-25 MED ORDER — ACETAMINOPHEN 325 MG PO TABS
650.0000 mg | ORAL_TABLET | Freq: Four times a day (QID) | ORAL | Status: DC | PRN
  Filled 2021-08-25: qty 2

## 2021-08-25 MED ORDER — MAGIC MOUTHWASH
15.0000 mL | Freq: Four times a day (QID) | ORAL | Status: DC
  Administered 2021-08-25 – 2021-08-26 (×4): 15 mL via ORAL
  Filled 2021-08-25 (×3): qty 15

## 2021-08-25 MED ORDER — FENTANYL CITRATE (PF) 100 MCG/2ML IJ SOLN
25.0000 ug | INTRAMUSCULAR | Status: DC | PRN
  Administered 2021-08-25 – 2021-08-26 (×3): 25 ug via INTRAVENOUS
  Filled 2021-08-25 (×3): qty 2

## 2021-08-25 MED ORDER — INSULIN ASPART 100 UNIT/ML IJ SOLN
5.0000 [IU] | Freq: Three times a day (TID) | INTRAMUSCULAR | Status: DC
  Administered 2021-08-25 – 2021-08-26 (×3): 5 [IU] via SUBCUTANEOUS

## 2021-08-25 NOTE — Progress Notes (Signed)
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  Dyspnea had been going on for last 3 months.    Patient's reported fever of 103 Fahrenheit at home.  He was diagnosed with COVID back in February but continues to be positive.  He is off chemotherapy for the last 3 months due to persistent respiratory issues, history of recent weight loss.  On presentation he was afebrile, normotensive but hypoxic on room air with 85% saturation.  He was placed on 4 L of nasal cannula.  Lab work showed pancytopenia. CT chest showed new moderate right pleural effusion, diffuse right peripheral solid and groundglass nodular densities representing infectious versus neoplastic process.  Started on azithromycin and ceftriaxone for pneumonia.  Also started on Lasix IV for severe volume overload, echocardiogram ordered to rule out CHF.  Hospital course remarkable for worsening respiratory status, requiring 15 L of oxygen in the AM of 08/22/21.  Patient  transferred to stepdown.  PCCM and cardiology consulted and following.Oncology also following.  Palliative care also consulted for goals of care.  Assessment & Plan:   Principal Problem:   Acute respiratory failure with hypoxia (HCC) Active Problems:   Diabetes mellitus, type II (HCC)   Diffuse large B cell lymphoma (HCC)   Thrombocytopenia (HCC)   Chronic kidney disease (CKD), 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HCC)   PAF (paroxysmal atrial fibrillation) (HCC)   COPD (chronic obstructive pulmonary disease) ([place])   Community acquired pneumonia   Pleural effusion   Leukopenia   Neutropenia (HCC)    Hypothyroidism   Acute respiratory acidosis (HCC)   Malnutrition of moderate degree   Pressure injury of skin   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HCC)   Acute hypoxic respiratory failure Multifactorial  Hypoxic on presentation, not on home O2, currently on 10 L of O2, continue to taper Thought to be secondary to possible right-sided pneumonia/right-sided pleural effusion vs radiation/covid pneumonitis versus progression of lymphoma.  Also has history of COPD/follows with Dr. [name]. Takes prednisone chronically CT chest also showed diffuse right peripheral solid and groundglass nodular densities representing infectious versus neoplastic process High-resolution CT scan showed findings of interstitial lung disease.  Also showed moderate right pleural effusion with atelectasis of the lower lobe. Completed ceftriaxone, azithromycin PCCM following here.  PCCM ordered autoimmune work-up, also started on Solu-Medrol Monitor closely  Lymphoma/hypogammaglobinemia Oncology following.  His pulmonary findings could be secondary to progression of lymphoma.  Received IVIG  Congestive heart failure/volume overload Appears euvolemic Had severe bilateral lower extremity edema. Elevated  BNP, started on  Lasix 80 mg IV twice a day.  Echo done here showed inferior/inferoseptal wall hypokinesis, moderate left ventricular hypertrophy, low normal right ventricular systolic function, moderate mitral valve regurgitation Cardiology consulted and following, lasix on hold now due to worsening renal status Monitor closely  AKI on CKD stage IV Creatinine worsening Baseline creatinine around 2.2 Continue to monitor.  Avoid nephrotoxins, Daily BMP  Diabetes type 2 with hyperglycemia  Likely worsened by steroids Adjusted insulin regimen, SSI, NovoLog 3 times daily, glargine  Hyponatremia Multifactorial Daily BMP  Pancytopenia Secondary to history of large B-cell lymphoma/chemotherapy.   He follows with Dr.  Alvy Bimler.  Oncology following here.  On Granix for low ANC,now D/ced.  Continue to monitor CBC  History of paroxysmal A. fib/a flutter Continue Xarelto He was also on amiodarone , now on held due to findings on the CT.  On metoprolol.  History of coronary artery disease: On statin, beta-blocker  Hypothyroidism: On levothyroxine  Debility/deconditioning/weakness: Has been very weak recently.    Lives with his son at home.  PT/OT recommended skilled nursing facility on DC.TOC consulted   Obesity: BMI 32.6  Goals of care:80 year old male who presents with acute hypoxic respiratory failure, found to have progression of lymphoma, currently on 15 L of oxygen.  Poor prognosis.  Goals of care discussed at the bedside.  Code changed to DNR/DNI as per patient's wishes.  Palliative care consulted  Nutrition Problem: Moderate Malnutrition Etiology: chronic illness, cancer and cancer related treatments      DVT prophylaxis:Xarelto Code Status: Full Family Communication: Discussed with significant other and son at bedside on 08/25/21 Patient status:Inpatient  Dispo: The patient is from: Home              Anticipated d/c is to: SNF              Anticipated d/c date is: Not sure for now  Consultants:  Oncology PCCM Cardiology  Procedures:None  Antimicrobials:  Anti-infectives (From admission, onward)    Start     Dose/Rate Route Frequency Ordered Stop   08/24/21 2000  cefTRIAXone (ROCEPHIN) 2 g in sodium chloride 0.9 % 100 mL IVPB        2 g 200 mL/hr over 30 Minutes Intravenous Every 24 hours 08/24/21 1221 08/24/21 2246   08/24/21 1315  azithromycin (ZITHROMAX) 500 mg in sodium chloride 0.9 % 250 mL IVPB  Status:  Discontinued        500 mg 250 mL/hr over 60 Minutes Intravenous Every 24 hours 08/24/21 1220 08/24/21 1220   08/19/21 2200  cefTRIAXone (ROCEPHIN) 2 g in sodium chloride 0.9 % 100 mL IVPB        2 g 200 mL/hr over 30 Minutes Intravenous Every 24 hours 08/08/2021 2314  08/23/21 2209   08/19/21 2000  azithromycin (ZITHROMAX) 500 mg in sodium chloride 0.9 % 250 mL IVPB        500 mg 250 mL/hr over 60 Minutes Intravenous Every 24 hours 08/05/2021 2314 08/23/21 2103   07/31/2021 2030  cefTRIAXone (ROCEPHIN) 1 g in sodium chloride 0.9 % 100 mL IVPB        1 g 200 mL/hr over 30 Minutes Intravenous  Once 08/11/2021 2018 08/14/2021 2117   08/09/2021 2030  azithromycin (ZITHROMAX) 500 mg in sodium chloride 0.9 % 250 mL IVPB        500 mg 250 mL/hr over 60 Minutes Intravenous  Once 08/17/2021 2018 07/20/2021 2321       Subjective:  Seen and examined at bedside, continues to be hypoxic requiring about 10 L of O2, denies any new complaints, denies any chest pain, abdominal pain, nausea/vomiting fever/chills.   Objective: Vitals:   08/25/21 1700 08/25/21 1800 08/25/21 1900 08/25/21 1917  BP: (!) 137/59 (!) 142/84    Pulse: 68 72 88   Resp: (!) 22 15 20    Temp:      TempSrc:      SpO2: 95% 93% (!) 87% 90%  Weight:      Height:        Intake/Output Summary (Last 24 hours) at 08/25/2021 1952 Last data filed at 08/25/2021 1700 Gross per 24 hour  Intake --  Output 600 ml  Net -600 ml   Filed Weights   08/19/21 1526 08/22/21 1005  Weight: 123.4 kg 121.5 kg    Examination: General: NAD, chronically ill-appearing, deconditioned Cardiovascular: S1, S2 present Respiratory: Sounds bilaterally Abdomen: Soft, nontender, nondistended, bowel sounds present Musculoskeletal: No bilateral pedal edema noted Skin: Normal Psychiatry: Normal mood    Data Reviewed: I have personally reviewed following labs and imaging studies  CBC: Recent Labs  Lab 08/20/21 0403 08/21/21 0432 08/22/21 0409 08/23/21 0330 08/24/21 0343  WBC 1.2* 0.9* 1.8* 3.7* 5.3  NEUTROABS 0.4* 0.2* 0.9* 2.2 4.5  HGB 10.3* 10.2* 10.4* 11.0* 10.2*  HCT 32.3* 31.8* 32.7* 34.0* 31.7*  MCV 85.2 85.5 84.9 83.1 84.1  PLT 124* 115* 107* 104* 93*   Basic Metabolic Panel: Recent Labs  Lab  08/23/21 0330 08/24/21 0343 08/24/21 2227 08/25/21 0400 08/25/21 1023  NA 132* 127* 128* 129* 128*  K 3.7 4.3 4.5 4.6 4.2  CL 98 92* 92* 94* 93*  CO2 23 23 21* 21* 21*  GLUCOSE 172* 391* 479* 511* 424*  BUN 55* 64* 78* 88* 91*  CREATININE 2.27* 2.55* 3.05* 3.38* 3.58*  CALCIUM 8.1* 8.0* 8.1* 8.1* 8.2*   GFR: Estimated Creatinine Clearance: 23.8 mL/min (A) (by C-G formula based on SCr of 3.58 mg/dL (H)). Liver Function Tests: No results for input(s): AST, ALT, ALKPHOS, BILITOT, PROT, ALBUMIN in the last 168 hours.  No results for input(s): LIPASE, AMYLASE in the last 168 hours. No results for input(s): AMMONIA in the last 168 hours. Coagulation Profile: No results for input(s): INR, PROTIME in the last 168 hours. Cardiac Enzymes: No results for input(s): CKTOTAL, CKMB, CKMBINDEX, TROPONINI in the last 168 hours. BNP (last 3 results) No results for input(s): PROBNP in the last 8760 hours. HbA1C: No results for input(s): HGBA1C in the last 72 hours. CBG: Recent Labs  Lab 08/25/21 0815 08/25/21 0927 08/25/21 1154 08/25/21 1617 08/25/21 1908  GLUCAP 459* 424* 416* 396* 379*   Lipid Profile: No results for input(s): CHOL, HDL, LDLCALC, TRIG, CHOLHDL, LDLDIRECT in the last 72 hours. Thyroid Function Tests: No results for input(s): TSH, T4TOTAL, FREET4, T3FREE, THYROIDAB in the last 72 hours. Anemia Panel: No results for input(s): VITAMINB12, FOLATE, FERRITIN, TIBC, IRON, RETICCTPCT in the last 72 hours. Sepsis Labs: Recent Labs  Lab 08/10/2021 2206 08/22/21 1340 08/23/21 0330 08/24/21 0343  PROCALCITON  --  0.21 0.33 0.43  LATICACIDVEN 1.3  --   --   --     Recent Results (from the past 240 hour(s))  Resp Panel by RT-PCR (Flu A&B, Covid) Nasopharyngeal Swab     Status: Abnormal   Collection Time: 07/24/2021  3:52 PM   Specimen: Nasopharyngeal Swab; Nasopharyngeal(NP) swabs in vial transport medium  Result Value Ref Range Status   SARS Coronavirus 2 by RT PCR  POSITIVE (A) NEGATIVE Final    Comment: CRITICAL RESULT CALLED TO, READ BACK BY AND VERIFIED WITH: RN N WHEATLEY AT 1957 08/13/2021 CRUICKSHANK A (NOTE) SARS-CoV-2 target nucleic acids are DETECTED.  The SARS-CoV-2 RNA is generally detectable in upper respiratory specimens during the acute phase of infection. Positive results are indicative of the presence of the identified virus, but do not rule out bacterial infection or co-infection with other pathogens not detected by the test. Clinical correlation with patient history and other diagnostic information is necessary to determine patient infection status. The expected result is Negative.  Fact Sheet for Patients: EntrepreneurPulse.com.au  Fact Sheet for Healthcare Providers:  IncredibleEmployment.be  This test is not yet approved or cleared by the [place] and  has been authorized for detection and/or diagnosis of SARS-CoV-2 by FDA under an Emergency Use Authorization (EUA).  This EUA will remain in effect (mean ing this test can be used) for the duration of  the COVID-19 declaration under Section 564(b)(1) of the Act, 21 U.S.C. section 360bbb-3(b)(1), unless the authorization is terminated or revoked sooner.     Influenza A by PCR NEGATIVE NEGATIVE Final   Influenza B by PCR NEGATIVE NEGATIVE Final    Comment: (NOTE) The Xpert Xpress SARS-CoV-2/FLU/RSV plus assay is intended as an aid in the diagnosis of influenza from Nasopharyngeal swab specimens and should not be used as a sole basis for treatment. Nasal washings and aspirates are unacceptable for Xpert Xpress SARS-CoV-2/FLU/RSV testing.  Fact Sheet for Patients: EntrepreneurPulse.com.au  Fact Sheet for Healthcare Providers: IncredibleEmployment.be  This test is not yet approved or cleared by the [place] FDA and has been authorized for detection and/or diagnosis of SARS-CoV-2 by FDA  under an Emergency Use Authorization (EUA). This EUA will remain in effect (meaning this test can be used) for the duration of the COVID-19 declaration under Section 564(b)(1) of the Act, 21 U.S.C. section 360bbb-3(b)(1), unless the authorization is terminated or revoked.  Performed at [facility], [place] [address] [place], [place] [address]   Culture, blood (Routine X 2) w Reflex to ID Panel     Status: None   Collection Time: 08/12/2021 10:02 PM   Specimen: BLOOD  Result Value Ref Range Status   Specimen Description   Final    BLOOD PORTA CATH Performed at [place] [address] [place], [place] [address]    Special Requests   Final    BOTTLES DRAWN AEROBIC AND ANAEROBIC Blood Culture adequate volume Performed at [place] [address] [place], [place] [address]    Culture   Final    NO GROWTH 5 DAYS Performed at [facility], [place] [address] [place], [place] [address]    Report Status 08/24/2021 FINAL  Final  Culture, blood (Routine X 2) w Reflex to ID Panel     Status: None   Collection Time: 08/19/21  4:24 AM   Specimen: BLOOD  Result Value Ref Range Status   Specimen Description   Final    BLOOD PORTA CATH Performed at [place] [address] [place], [place] [address]    Special Requests   Final    BOTTLES DRAWN AEROBIC AND ANAEROBIC Blood Culture results may not be optimal due to an excessive volume of blood received in culture bottles Performed at [place] [address] [place], [place] [address]    Culture   Final    NO GROWTH 5 DAYS Performed at [facility], [place] [address], [place], [place] [address]    Report Status 08/24/2021 FINAL  Final  MRSA Next Gen by PCR, Nasal     Status: None   Collection Time: 08/22/21  9:27 AM   Specimen: Nasal Mucosa; Nasal Swab  Result Value Ref Range Status   MRSA by PCR Next Gen NOT DETECTED NOT  DETECTED Final    Comment: (NOTE) The GeneXpert MRSA Assay (FDA approved for NASAL specimens only), is one component of a comprehensive MRSA colonization surveillance program. It is not intended to diagnose MRSA infection nor to guide or monitor treatment for MRSA infections.  Test performance is not FDA approved in patients less than 37 years old. Performed at [facility], [place] [address] [place], [place] [address]   Respiratory (~20 pathogens) panel by PCR     Status: None   Collection Time: 08/22/21  1:40 PM   Specimen: Nasopharyngeal Swab; Respiratory  Result Value Ref Range Status   Adenovirus NOT DETECTED NOT DETECTED Final   Coronavirus 229E NOT DETECTED NOT DETECTED Final    Comment: (NOTE) The Coronavirus on the Respiratory Panel, DOES NOT test for the novel  Coronavirus (2019 nCoV)    Coronavirus HKU1 NOT DETECTED NOT DETECTED Final   Coronavirus NL63 NOT DETECTED NOT DETECTED Final   Coronavirus OC43 NOT DETECTED NOT DETECTED Final   Metapneumovirus NOT DETECTED NOT DETECTED Final   Rhinovirus / Enterovirus NOT DETECTED NOT DETECTED Final   Influenza A NOT DETECTED NOT DETECTED Final   Influenza B NOT DETECTED NOT DETECTED Final   Parainfluenza Virus 1 NOT DETECTED NOT DETECTED Final   Parainfluenza Virus 2 NOT DETECTED NOT DETECTED Final   Parainfluenza Virus 3 NOT DETECTED NOT DETECTED Final   Parainfluenza Virus 4 NOT DETECTED NOT DETECTED Final   Respiratory Syncytial Virus NOT DETECTED NOT DETECTED Final   Bordetella pertussis NOT DETECTED NOT DETECTED Final   Bordetella Parapertussis NOT DETECTED NOT DETECTED Final   Chlamydophila pneumoniae NOT DETECTED NOT DETECTED Final   Mycoplasma pneumoniae NOT DETECTED NOT DETECTED Final    Comment: Performed at [facility], [place]. [address] [place], [place] [address]  Body fluid culture w Gram Stain     Status: None (Preliminary result)   Collection Time: 08/23/21  3:04 PM   Specimen: PATH  Cytology Pleural fluid  Result Value Ref Range Status   Specimen Description   Final    PLEURAL Performed at [facility] [address] [place], [place] [address]    Special Requests   Final    NONE Performed at [facility], [place] [address] [place], [place] [address]    Gram Stain   Final    RARE WBC PRESENT, PREDOMINANTLY MONONUCLEAR NO ORGANISMS SEEN    Culture   Final    NO GROWTH 2 DAYS Performed at [facility], [place] [address], [place], [place] [address]    Report Status PENDING  Incomplete         Radiology Studies: No results found.      Scheduled Meds:  (feeding supplement) PROSource Plus  30 mL Oral BID BM   bisacodyl  10 mg Rectal Once   Chlorhexidine Gluconate Cloth  6 each Topical Daily   feeding supplement  237 mL Oral BID BM   insulin aspart  0-20 Units Subcutaneous Q4H   insulin aspart  0-5 Units Subcutaneous QHS   insulin aspart  5 Units Subcutaneous TID WC   insulin glargine-yfgn  20 Units Subcutaneous BID   levothyroxine  25 mcg Oral Q0600   magic mouthwash  15 mL Oral QID   mouth rinse  15 mL Mouth Rinse BID   methylPREDNISolone (SOLU-MEDROL) injection  60 mg Intravenous Q12H   metoprolol succinate  12.5 mg Oral Daily   mometasone-formoterol  2 puff Inhalation BID   multivitamin with minerals  1 tablet Oral Daily   polyethylene glycol  17 g Oral Daily   rivaroxaban  15 mg Oral Q supper   rosuvastatin  10 mg Oral Daily   senna  1 tablet Oral BID  tamsulosin  0.4 mg Oral Daily   umeclidinium bromide  1 puff Inhalation Daily   Continuous Infusions:  sodium chloride Stopped (08/22/21 2319)     LOS: 7 days          Alma Friendly, MD [facility] P12/03/2021, 7:52 PM

## 2021-08-25 NOTE — Progress Notes (Signed)
Inpatient Diabetes Program Recommendations  AACE/ADA: New Consensus Statement on Inpatient Glycemic Control (2015)  Target Ranges:  Prepandial:   less than 140 mg/dL      Peak postprandial:   less than 180 mg/dL (1-2 hours)      Critically ill patients:  140 - 180 mg/dL   Lab Results  Component Value Date   GLUCAP 424 (H) 08/25/2021   HGBA1C 5.9 (H) 07/11/2016    Review of Glycemic Control  Latest Reference Range & Units 08/25/21 04:41 08/25/21 08:15 08/25/21 09:27  Glucose-Capillary 70 - 99 mg/dL 473 (H) 459 (H) 424 (H)   Diabetes history: DM 2 Outpatient Diabetes medications: Amaryl 2 mg Daily Current orders for Inpatient glycemic control:  Semglee 20 units Daily Novolog 0-20 units tid  Solumedrol 60 mg Q12 hours Ensure Enlive bid between meals  Inpatient Diabetes Program Recommendations:    -  Increase Semglee to 20 units bid -  Change Novolog to Q4 hours -  Add Novolog 5 units tid meal coverage if eating >50% of meals  Thanks,  Tama Headings RN, MSN, BC-ADM Inpatient Diabetes Coordinator Team Pager [phone] (8a-5p)

## 2021-08-25 NOTE — Consult Note (Signed)
Consultation Note Date: 08/25/2021   Patient Name: Derek [name]  DOB: 1940/12/31  MRN: [id number]  Age / Sex: 80 y.o., male  PCP: Wenda Low, MD Referring Physician: Alma Friendly, MD  Reason for Consultation: Establishing goals of care  HPI/Patient Profile: 80 y.o. male  with past medical history of COPD, lymphoma, a fib, CKD, htn, CAD, and T2DM admitted on 07/28/2021 with fever and hypoxia. Also with pancytopenia.  Patient is being treated for multifactorial respiratory failure d/t CAP, pleural effusion, ?repeat COVID infection, COPD exacerbation. Also with concern of progression of lymphoma. Patient requiring HFNC. Patient has not had chemo for 3 months d/t respiratory concerns and weigh loss. IVIG has been started by oncology. PMT consulted for [place] discussion.  Clinical Assessment and Goals of Care: I have reviewed medical records including EPIC notes, labs and imaging, received report from RN, assessed the patient and then met with patient and 2 friends at bedside  to discuss diagnosis prognosis, GOC, EOL wishes, disposition and options.  I introduced Palliative Medicine as specialized medical care for people living with serious illness. It focuses on providing relief from the symptoms and stress of a serious illness. The goal is to improve quality of life for both the patient and the family.  Patient tells me he would like his son at bedside for further goals of care discussion - we scheduled meeting for tomorrow at 2 pm.   He asks about prognosis - we discuss time for outcomes with current treatment. Discuss current need for high amounts of oxygen and need to continue to decrease this.   We briefly discussed potential options for care outside of hospital - he is open to rehab.   Denies feeling short of breath. C/o mouth pain - magic mouthwash started this PM. Consider fluconazole? Also c/o constipation but does not want to change  bowel regimen yet - hoping to have Bm this afternoon.  Questions and concerns were addressed. The family was encouraged to call with questions or concerns.    Primary Decision Maker PATIENT    SUMMARY OF RECOMMENDATIONS   F/u with patient and son 12/8 14:00 Consider oral fluconazole for thrush?  Code Status/Advance Care Planning: DNR  Discharge Planning: To Be Determined      Primary Diagnoses: Present on Admission:  Acute respiratory failure with hypoxia (HCC)  Thrombocytopenia (HCC)  Chronic kidney disease (CKD), 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HCC)  COPD (chronic obstructive pulmonary disease) (HCC)  Diffuse large B cell lymphoma (HCC)  PAF (paroxysmal atrial fibrillation) ([place])  Acute respiratory acidosis ([place])   I have reviewed the medical record, interviewed the patient and family, and examined the patient. The following aspects are pertinent.  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   GERD (gastroesophageal reflux disease)    occ tums   H/O cardiac catheterization    H/O exercise stress test 1999, 2002   Hypertension    Hypertriglyceridemia    Lymphocytosis    Obesity    Pulmonary Function Test 09/2018   PFTs 09/2018:  FEV1 88% predicted; FEV1/FVC 78%; DLCO cor 58   Social History   Socioeconomic History   Marital status: Widowed    Spouse name: Not on file   Number  of children: 3   Years of education: Not on file   Highest education level: Not on file  Occupational History    Employer: [employer]    Comment: retied Audiological scientist; asphalt work   Tobacco Use   Smoking status: Former    Packs/day: 3.00    Years: 10.00    Pack years: 30.00    Types: Cigarettes    Quit date: 09/19/1988    Years since quitting:  32.9   Smokeless tobacco: Never  Vaping Use   Vaping Use: Never used  Substance and Sexual Activity   Alcohol use: Yes    Alcohol/week: 17.0 - 19.0 standard drinks    Types: 14 Shots of liquor, 3 - 5 Standard drinks or equivalent per week   Drug use: No   Sexual activity: Not on file  Other Topics Concern   Not on file  Social History Narrative   Not on file   Social Determinants of Health   Financial Resource Strain: Not on file  Food Insecurity: Not on file  Transportation Needs: Not on file  Physical Activity: Not on file  Stress: Not on file  Social Connections: Not on file   Family History  Problem Relation Age of Onset   Heart attack Mother    Cancer Father        liver   Scheduled Meds:  (feeding supplement) PROSource Plus  30 mL Oral BID BM   bisacodyl  10 mg Rectal Once   Chlorhexidine Gluconate Cloth  6 each Topical Daily   feeding supplement  237 mL Oral BID BM   insulin aspart  0-20 Units Subcutaneous Q4H   insulin aspart  0-5 Units Subcutaneous QHS   insulin aspart  5 Units Subcutaneous TID WC   insulin glargine-yfgn  20 Units Subcutaneous BID   levothyroxine  25 mcg Oral Q0600   magic mouthwash  15 mL Oral QID   mouth rinse  15 mL Mouth Rinse BID   methylPREDNISolone (SOLU-MEDROL) injection  60 mg Intravenous Q12H   metoprolol succinate  12.5 mg Oral Daily   mometasone-formoterol  2 puff Inhalation BID   multivitamin with minerals  1 tablet Oral Daily   polyethylene glycol  17 g Oral Daily   rivaroxaban  15 mg Oral Q supper   rosuvastatin  10 mg Oral Daily   senna  1 tablet Oral BID   tamsulosin  0.4 mg Oral Daily   umeclidinium bromide  1 puff Inhalation Daily   Continuous Infusions:  sodium chloride Stopped (08/22/21 2319)   PRN Meds:.sodium chloride, albuterol, benzonatate, ipratropium-albuterol, oxyCODONE Allergies  Allergen Reactions   Lipitor [Atorvastatin] Rash   Metformin Diarrhea   Review of Systems  Constitutional:  Positive for  activity change and appetite change.  HENT:  Positive for mouth sores.   Respiratory:  Negative for shortness of breath.   Gastrointestinal:  Positive for constipation.   Physical Exam Constitutional:      General: He is not in acute distress. Pulmonary:     Effort: Pulmonary effort is normal.     Comments: Remains on HFNC Skin:    General: Skin is warm and dry.  Neurological:     Mental Status: He is alert and oriented to person, place, and time.    Vital Signs: BP 133/66   Pulse 63   Temp 97.7 F (36.5 C) (Oral)   Resp (!) 23   Ht $R'6\' 4"'DA$  (1.93 m)   Wt 121.5 kg   SpO2 94%  BMI 32.60 kg/m  Pain Scale: 0-10   Pain Score: 0-No pain   SpO2: SpO2: 94 % O2 Device:SpO2: 94 % O2 Flow Rate: .O2 Flow Rate (L/min): 11 L/min  IO: Intake/output summary:  Intake/Output Summary (Last 24 hours) at 08/25/2021 1601 Last data filed at 08/25/2021 1200 Gross per 24 hour  Intake --  Output 400 ml  Net -400 ml    LBM: Last BM Date: 08/20/21 Baseline Weight: Weight: 123.4 kg Most recent weight: Weight: 121.5 kg     Palliative Assessment/Data: PPS 40%    Time Total: 45 minutes Greater than 50%  of this time was spent counseling and coordinating care related to the above assessment and plan.  Juel Burrow, DNP, AGNP-C Palliative Medicine Team [phone] Pager: [phone]

## 2021-08-25 NOTE — Progress Notes (Signed)
   Creatinine continues to climb off lasix. Prognosis seems unfortunately poor. Continue to hold amiodarone and lasix. No further suggestions. Cardiology will sign-off. Call with questions.  [facility] will sign off.   Medication Recommendations:  as above Other recommendations (labs, testing, etc):  none Follow up as an outpatient:  PRN/[place]  Pixie Casino, MD, [facility], [place] Director of the Advanced Lipid Disorders &  Cardiovascular Risk Reduction Clinic Diplomate of the American Board of Clinical Lipidology Attending Cardiologist  Direct Dial: [phone]  Fax: [phone]  Website:  [email or web address]

## 2021-08-25 NOTE — Progress Notes (Signed)
[name] Progress Note Patient Name: Derek [name] DOB: 08-04-1941 MRN: [id number]   Date of Service  08/25/2021  HPI/Events of Note  Multiple issues: 1. AFIB with RVR - Ventricular rate = 110-125. BP = 133/56. Patient is already on Toprol XL and Xarelto. 2. Patient c/o back pain not improved with Oxycodone IR.   eICU Interventions  Plan: BMP and Mg++ level STAT. Metoprolol 2.5 mg IV Q 2 hours PRN HR > 115. Fentanyl 25 mcg IV Q 2 hours PRN pain.         Derek [name] 08/25/2021, 11:13 PM

## 2021-08-25 NOTE — Progress Notes (Signed)
Patient complains of the sound of the oxygen flow needed to maintain his SpO2 and wants to remove the canula. Offered to place on BiPAP for sleep and remove the nasal O2 but patient refuses to use BiPAP tonight. He and his wife, who remains at bedside, states the MD informed them today that he did not need to use BiPAP tonight if it irritated him to wear it. Explained that orders do not reflect such and attempted to educate on the pros of BiPAP with COVID. However they were not interested in education or further conversations of BiPAP at this time. RN aware. Patient remains on [place] canula O2. RT will continue to follow and assist with patient needs.

## 2021-08-25 NOTE — Progress Notes (Signed)
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  Treated with chemotherapy and Rituxan.    Had COVID in February versus August 2022 2022 and persistent PCR positivity since then.  60 pound weight loss in the last 8 months.  He also has worsening shortness of breath for the last 3 months.  During these 3 months he has been off his chemotherapy.  He is also on chronic prednisone since around October 2022 by hematology Dr. Simeon Craft such.      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  By May 2022 he developed a right lower lobe density.  He had CT-guided biopsy 03/08/2021 that shows atypical lymphoid infiltrate consistent with non-Hodgkin B-cell lymphoma.  In September 2022 PET scans do seem to be slightly getting better.  However, that is progressively worse mid November 2022 CT scan and also 08/04/2021 admit CT scan.  This fits in with his progressive hypoxemia.  The ILD changes itself appears to be somewhat worse but is hard to say..  The overwhelming complex here massive right lower lobe density -with air bronchograms that is getting worse.  [In September 2022 PET scan it seems to be completely getting better.]    Pulmonary consultation in the office 07/21/2021 Dr. Baltazar Apo was concerned about drug-induced lung injury versus long COVID residual infiltrates.  Plan was to continue prednisone for another month and then  taper to 10 mg for a month and then slowly taper off.  He then presented 08/04/2021 with fever of 103, hypoxemic 85% on room air corrected with 4 L nasal cannula, leukopenia 2.3 with a left shift and a hemoglobin of 5.3.  [Pancytopenia] CT chest at admission shows new onset moderate right oral effusion [too small for thoracentesis per ultrasound], right lower lobe atelectasis airspace disease worrisome for infection and new diffuse right lung peripheral solid and groundglass nodular densities along with groundglass and interstitial opacities in the left lung.  Back in May 2022 thoracic radiology consider some of these findings as "probable UIP".  His subcarinal lymph node itself is stable.  Is being treated with azithromycin and ceftriaxone.  Also IV Lasix for possible volume overload.  Despite this he is progressing requiring 15 L nasal cannula.  He was transferred to intensive care unit and pulmonary critical care medicine has been consulted.  Urine Streptococcus negative.  He had an echocardiogram for window: There is concern for slightly reduced ejection fraction probably related to chemotherapy.  Of note patient reported that his pedal edema has improved with Lasix although his hypoxemia is worse.  Denies any chest pain.    Past Medical History:    has a past medical history of Blood dyscrasia, CLL (chronic lymphocytic leukemia) ([place]) (09/10/2013), Diabetes mellitus, type II ([place]), Dilated cardiomyopathy ([place]) (09/14/2018), GERD (gastroesophageal reflux disease), H/O cardiac catheterization, H/O exercise stress test (1999, 2002), Hypertension, Hypertriglyceridemia, Lymphocytosis, Obesity, and Pulmonary Function Test (09/2018).   reports that he quit  smoking about 32 years ago. His smoking use included cigarettes. He has a 30.00 pack-year smoking history. He has never used smokeless tobacco.  Past Surgical History:  Procedure Laterality Date   CARDIOVERSION N/A 09/05/2018   Procedure:  CARDIOVERSION;  Surgeon: Jerline Pain, MD;  Location: [place];  Service: Cardiovascular;  Laterality: N/A;   CARDIOVERSION N/A 10/22/2018   Procedure: CARDIOVERSION;  Surgeon: Jerline Pain, MD;  Location: [place];  Service: Cardiovascular;  Laterality: N/A;   CARDIOVERSION N/A 08/28/2020   Procedure: CARDIOVERSION;  Surgeon: Josue [name], MD;  Location: [place];  Service: Cardiovascular;  Laterality: N/A;   CARDIOVERSION N/A 11/30/2020   Procedure: CARDIOVERSION;  Surgeon: Jerline Pain, MD;  Location: [facility] ENDOSCOPY;  Service: Cardiovascular;  Laterality: N/A;   CARDIOVERSION N/A 04/22/2021   Procedure: CARDIOVERSION;  Surgeon: Buford Dresser, MD;  Location: [facility] ENDOSCOPY;  Service: Cardiovascular;  Laterality: N/A;   CHOLECYSTECTOMY N/A 07/12/2016   Procedure: LAPAROSCOPIC CHOLECYSTECTOMY;  Surgeon: Donnie Mesa, MD;  Location: [place];  Service: General;  Laterality: N/A;   EXTRACORPOREAL SHOCK WAVE LITHOTRIPSY Right 11/18/2019   Procedure: EXTRACORPOREAL SHOCK WAVE LITHOTRIPSY (ESWL);  Surgeon: Lucas Mallow, MD;  Location: [facility];  Service: Urology;  Laterality: Right;   FOOT SURGERY Right    IR IMAGING GUIDED PORT INSERTION  03/19/2021   NASAL SINUS SURGERY     TEE WITHOUT CARDIOVERSION N/A 09/05/2018   Procedure: TRANSESOPHAGEAL ECHOCARDIOGRAM (TEE);  Surgeon: Jerline Pain, MD;  Location: [facility] ENDOSCOPY;  Service: Cardiovascular;  Laterality: N/A;   TEE WITHOUT CARDIOVERSION N/A 08/28/2020   Procedure: TRANSESOPHAGEAL ECHOCARDIOGRAM (TEE);  Surgeon: Josue [name], MD;  Location: [facility] ENDOSCOPY;  Service: Cardiovascular;  Laterality: N/A;   TONSILLECTOMY     UMBILICAL HERNIA REPAIR N/A 07/12/2016   Procedure: [place];  Surgeon: Donnie Mesa, MD;  Location: [place];  Service: General;  Laterality: N/A;   vericose vein stripping      Allergies  Allergen Reactions   Lipitor [Atorvastatin] Rash   Metformin Diarrhea     Immunization History  Administered Date(s) Administered   Influenza Split 06/09/2009, 07/19/2011, 09/05/2012, 06/19/2014, 06/25/2015   Influenza, High Dose Seasonal PF 06/07/2016, 06/12/2017, 07/04/2018, 05/31/2019   Influenza,inj,Quad PF,6+ Mos 06/11/2014, 06/25/2015   Influenza-Unspecified 07/21/2019   PFIZER(Purple Top)SARS-COV-2 Vaccination 02/13/2020, 03/05/2020, 06/01/2020   Pneumococcal Conjugate-13 07/09/2014   Pneumococcal Polysaccharide-23 12/08/2009   Td 09/09/2002, 12/18/2012   Zoster Recombinat (Shingrix) 06/14/2019   Zoster, Live 06/18/2012, 06/14/2019, 01/22/2020    Family History  Problem Relation Age of Onset   Heart attack Mother    Cancer Father        liver     Current Facility-Administered Medications:    (feeding supplement) PROSource Plus liquid 30 mL, 30 mL, Oral, BID BM, Adhikari, Amrit, MD, 30 mL at 08/24/21 2100   0.9 %  sodium chloride infusion, , Intravenous, PRN, Shelly Coss, MD, Stopped at 08/22/21 2319   albuterol (VENTOLIN HFA) 108 (90 Base) MCG/ACT inhaler 2 puff, 2 puff, Inhalation, Q2H PRN, Fredia Sorrow, MD, 2 puff at 08/22/21 2034   benzonatate (TESSALON) capsule 200 mg, 200 mg, Oral, TID PRN, Tu, Ching T, DO   bisacodyl (DULCOLAX) suppository 10 mg, 10 mg, Rectal, Once, Adhikari, Amrit, MD   Chlorhexidine Gluconate Cloth 2 % PADS 6 each, 6 each, Topical, Daily, Kathryne Eriksson, NP, 6 each at 08/24/21 2100   feeding supplement (ENSURE ENLIVE / ENSURE PLUS) liquid 237 mL, 237 mL, Oral, BID  BM, Shelly Coss, MD, 237 mL at 08/23/21 0921   insulin aspart (novoLOG) injection 0-20 Units, 0-20 Units, Subcutaneous, Q4H, Booker Bhatnagar, Otilio Carpen, PA-C   insulin aspart (novoLOG) injection 0-5 Units, 0-5 Units, Subcutaneous, QHS, Adhikari, Amrit, MD, 5 Units at 08/24/21 2215   insulin glargine-yfgn (SEMGLEE) injection 20 Units, 20 Units, Subcutaneous, Daily, Adhikari, Amrit, MD, 20 Units at 08/24/21 1845   ipratropium-albuterol (DUONEB) 0.5-2.5 (3)  MG/3ML nebulizer solution 3 mL, 3 mL, Nebulization, Q6H PRN, Tawanna Solo, Amrit, MD, 3 mL at 08/22/21 0831   levothyroxine (SYNTHROID) tablet 25 mcg, 25 mcg, Oral, Q0600, Tu, Ching T, DO, 25 mcg at 08/25/21 0455   MEDLINE mouth rinse, 15 mL, Mouth Rinse, BID, Adhikari, Amrit, MD, 15 mL at 08/24/21 2215   methylPREDNISolone sodium succinate (SOLU-MEDROL) 125 mg/2 mL injection 60 mg, 60 mg, Intravenous, Q12H, McQuaid, Douglas B, MD, 60 mg at 08/25/21 0114   metoprolol succinate (TOPROL-XL) 24 hr tablet 12.5 mg, 12.5 mg, Oral, Daily, Tu, Ching T, DO, 12.5 mg at 08/24/21 1033   mometasone-formoterol (DULERA) 100-5 MCG/ACT inhaler 2 puff, 2 puff, Inhalation, BID, Tu, Ching T, DO, 2 puff at 08/25/21 0818   multivitamin with minerals tablet 1 tablet, 1 tablet, Oral, Daily, Adhikari, Amrit, MD, 1 tablet at 08/24/21 1033   oxyCODONE (Oxy IR/ROXICODONE) immediate release tablet 5 mg, 5 mg, Oral, Q6H PRN, Shelly Coss, MD, 5 mg at 08/23/21 2138   polyethylene glycol (MIRALAX / GLYCOLAX) packet 17 g, 17 g, Oral, Daily, Adhikari, Amrit, MD, 17 g at 08/24/21 1033   Rivaroxaban (XARELTO) tablet 15 mg, 15 mg, Oral, Q supper, Adhikari, Amrit, MD, 15 mg at 08/24/21 1845   rosuvastatin (CRESTOR) tablet 10 mg, 10 mg, Oral, Daily, Tu, Ching T, DO, 10 mg at 08/24/21 1033   senna (SENOKOT) tablet 8.6 mg, 1 tablet, Oral, BID, Adhikari, Amrit, MD, 8.6 mg at 08/24/21 2214   tamsulosin (FLOMAX) capsule 0.4 mg, 0.4 mg, Oral, Daily, Tu, Ching T, DO, 0.4 mg at 08/24/21 1033   umeclidinium bromide (INCRUSE ELLIPTA) 62.5 MCG/ACT 1 puff, 1 puff, Inhalation, Daily, Tu, Ching T, DO, 1 puff at 08/25/21 0819     Significant Hospital Events:  08/08/2021 - admit 12/5 Continued 15L HFNC 12/7 Improving slightly after initiation of steroids and after thoracentesis   Interim History / Subjective:   Somewhat improved today, down to 11L from 15L Looks more energetic   Objective   Blood pressure (!) 147/69, pulse 62, temperature  98.6 F (37 C), temperature source Oral, resp. rate 19, height 6\' 4"  (1.93 m), weight 121.5 kg, SpO2 95 %.       No intake or output data in the 24 hours ending 08/25/21 0921  Filed Weights   08/19/21 1526 08/22/21 1005  Weight: 123.4 kg 121.5 kg     General:  chronically ill-appearing M, sitting up in bed, conversational in no acute distress HEENT: MM pink/moist, [place] in place, sclera anicteric  Neuro: awake, alert, oriented without focal deficits CV: s1s2 rrr, no m/r/g PULM:  mildly decreased air entry in the bilateral bases, no rhonchi or wheezing, no increased WOB at rest on 11L [place] GI: soft, bsx4 active  Extremities: warm/dry, no edema  Skin: no rashes or lesions    Resolved Hospital Problem list     Assessment & Plan:    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 Failure Likely multifactorial etiology included CAP and pleural effusion with likely repeat Covid-19 infection 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 vs drug induced lung injury from amiodarone/chemo with tobacco  use history and COPD High 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/4 consistent with UIP, R pleural effusion with airspace consolidation concerning for RLL PNA S/P thoracentesis 12/5 with 500cc removed -possibly progression of patient's lymphoma, follow cytology, pleural fluid studies most consistent with inflammatory process -covid-19 cycle threshold consistent with acute infection, Remdesevir not indicated secondary to time after onset, continue Dexamethasone -continue HFNC to maintain sats >92%, wean as able  -continue mobilizing with PT -completed 5 day course Cefepime/Azithromycin blood cultures negative  -continue Albuterol, Dulera, Incruse Ellipta  -Bipap qhs -Quantiferon gold, Sjogrens syndrome, ANCA titers, Glomerular baseline membrane Ab, Anti-DNA ab, RF, ANA, legionella all pending      B-cell Lymphoma with pan-cytopenia Right lower lobe lung mass 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; status post CT-guided thoracic biopsy  03/09/2019 -biopsy features consistent with B-cell lymphoma -Appreciate heme/Onc recommendations, pt to receive IVIG, cannot move forwards with chemotherapy until his respiratory status has improved -palliative care consulted to assist with clarifying goals of care  Type 2 DM with hyperglycemia -hyperglycemic today, TRH managing -20 units Semglee initiated yesterday -change to carb modified diet and resistant SSI q4hs  MSK -Failure to thrive with significant weight loss -nutrition evaluated   Atrial Fibrillation/Flutter -cardiology following  -continue Xarelto -continue Metoprolol, amiodarone held 2/2 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 [place] Bells 12/5, pt confirmed DNR 12/6     Valeria Batman Bonham Zingale, PA-C [facility] See Amion for pager If no response to pager , please call [phone] until 7pm After 7:00 pm call Elink  [phone]  Lab 08/22/21 0840  PHART 7.425  PCO2ART 31.7*  PO2ART 89.7  HCO3 20.4  O2SAT 96.9     CBC Recent Labs  Lab 08/22/21 0409 08/23/21 0330 08/24/21 0343  HGB 10.4* 11.0* 10.2*  HCT 32.7* 34.0* 31.7*  WBC 1.8* 3.7* 5.3  PLT 107* 104* 93*     COAGULATION No results for input(s): INR in the last 168 hours.  CARDIAC  No results for input(s): TROPONINI in the last 168 hours. No results for input(s): PROBNP in the last 168 hours.   CHEMISTRY Recent Labs  Lab 08/22/21 0409 08/23/21 0330 08/24/21 0343 08/24/21 2227 08/25/21 0400  NA 132* 132* 127* 128* 129*  K 3.5 3.7 4.3 4.5 4.6  CL 99 98 92* 92* 94*  CO2 23 23 23  21* 21*  GLUCOSE 220* 172* 391* 479* 511*  BUN 45* 55* 64* 78* 88*  CREATININE 2.05* 2.27* 2.55* 3.05* 3.38*  CALCIUM 7.7* 8.1* 8.0* 8.1* 8.1*    Estimated Creatinine Clearance: 25.2 mL/min (A) (by C-G formula based on SCr of 3.38 mg/dL (H)).   LIVER Recent Labs   Lab 07/29/2021 1551  AST 33  ALT 15  ALKPHOS 62  BILITOT 0.8  PROT 6.3*  ALBUMIN 2.7*      INFECTIOUS Recent Labs  Lab 08/17/2021 2206 08/22/21 1340 08/23/21 0330 08/24/21 0343  LATICACIDVEN 1.3  --   --   --   PROCALCITON  --  0.21 0.33 0.43      ENDOCRINE CBG (last 3)  Recent Labs    08/24/21 2144 08/25/21 0441 08/25/21 0815  GLUCAP 409* 473* 459*         IMAGING x48h  - image(s) personally visualized  -   highlighted in bold DG Chest 1 View  Result Date:  08/23/2021 CLINICAL DATA:  Status post right thoracentesis EXAM: CHEST  1 VIEW COMPARISON:  08/22/2021 FINDINGS: Power port on the right has its tip at the SVC RA junction. There is some reduction of the amount of pleural fluid on the right. Moderate pleural effusion does persist with right lower lung atelectasis and or pneumonia. Left lower lobe atelectasis remains visible as well. No pneumothorax. IMPRESSION: No pneumothorax following thoracentesis. Some persistent pleural fluid on the right with right lower lobe atelectasis and or pneumonia. Electronically Signed   By: Nelson Chimes M.D.   On: 08/23/2021 15:38   US THORACENTESIS ASP PLEURAL SPACE W/IMG GUIDE  Result Date: 08/23/2021 INDICATION: Patient with a history of large B-cell lymphoma and hospital admission for respiratory failure secondary to COVID pneumonia. Patient found to have right pleural effusion. Interventional radiology asked to perform a diagnostic and therapeutic thoracentesis. EXAM: ULTRASOUND GUIDED THORACENTESIS MEDICATIONS: 1% lidocaine 10 mL COMPLICATIONS: None immediate. PROCEDURE: An ultrasound guided thoracentesis was thoroughly discussed with the patient and questions answered. The benefits, risks, alternatives and complications were also discussed. The patient understands and wishes to proceed with the procedure. Written consent was obtained. Ultrasound was performed to localize and mark an adequate pocket of fluid in the right chest. The  area was then prepped and draped in the normal sterile fashion. 1% Lidocaine was used for local anesthesia. Under ultrasound guidance a 6 Fr Safe-T-Centesis catheter was introduced. Thoracentesis was performed. The catheter was removed and a dressing applied. FINDINGS: A total of approximately 500 mL of light brown fluid was removed. Samples were sent to the laboratory as requested by the clinical team. IMPRESSION: Successful ultrasound guided right thoracentesis yielding 500 mL of pleural fluid. Read by: Soyla Dryer, NP Electronically Signed   By: Markus Daft M.D.   On: 08/23/2021 15:45

## 2021-08-25 NOTE — Progress Notes (Addendum)
eLink Physician-Brief Progress Note Patient Name: Derek [name] DOB: 03/21/1941 MRN: [id number]   Date of Service  08/25/2021  HPI/Events of Note  Patient c/o SOB with some increased WOB. Patient has refused BiPAP earlier tonight and is a DNR/DNI. Etiology uncertain - AECOPD, volume overload, progession of pneumonia or anginal equivalent? Sat = 92% on 15 L/min [place].   eICU Interventions  Plan: Portable CXR STAT. Please give already ordered Duoneb or Albuterol MDI.  12 Lead EKG STAT. Cycle Troponin.     Intervention Category Major Interventions: Other:  Lysle Dingwall 08/25/2021, 11:53 PM

## 2021-08-26 ENCOUNTER — Telehealth: Payer: Self-pay | Admitting: Cardiology

## 2021-08-26 ENCOUNTER — Inpatient Hospital Stay (HOSPITAL_COMMUNITY): Payer: Medicare Other

## 2021-08-26 DIAGNOSIS — J9602 Acute respiratory failure with hypercapnia: Secondary | ICD-10-CM | POA: Diagnosis not present

## 2021-08-26 DIAGNOSIS — D801 Nonfamilial hypogammaglobulinemia: Secondary | ICD-10-CM | POA: Diagnosis not present

## 2021-08-26 DIAGNOSIS — J9601 Acute respiratory failure with hypoxia: Secondary | ICD-10-CM | POA: Diagnosis not present

## 2021-08-26 LAB — CBC WITH DIFFERENTIAL/PLATELET
Abs Immature Granulocytes: 0.46 10*3/uL — ABNORMAL HIGH (ref 0.00–0.07)
Basophils Absolute: 0 10*3/uL (ref 0.0–0.1)
Basophils Relative: 0 %
Eosinophils Absolute: 0 10*3/uL (ref 0.0–0.5)
Eosinophils Relative: 0 %
HCT: 34.9 % — ABNORMAL LOW (ref 39.0–52.0)
Hemoglobin: 11.5 g/dL — ABNORMAL LOW (ref 13.0–17.0)
Immature Granulocytes: 7 %
Lymphocytes Relative: 5 %
Lymphs Abs: 0.3 10*3/uL — ABNORMAL LOW (ref 0.7–4.0)
MCH: 27.4 pg (ref 26.0–34.0)
MCHC: 33 g/dL (ref 30.0–36.0)
MCV: 83.3 fL (ref 80.0–100.0)
Monocytes Absolute: 0.6 10*3/uL (ref 0.1–1.0)
Monocytes Relative: 10 %
Neutro Abs: 5.1 10*3/uL (ref 1.7–7.7)
Neutrophils Relative %: 78 %
Platelets: 94 10*3/uL — ABNORMAL LOW (ref 150–400)
RBC: 4.19 MIL/uL — ABNORMAL LOW (ref 4.22–5.81)
RDW: 16.3 % — ABNORMAL HIGH (ref 11.5–15.5)
WBC: 6.6 10*3/uL (ref 4.0–10.5)
nRBC: 0.3 % — ABNORMAL HIGH (ref 0.0–0.2)

## 2021-08-26 LAB — BASIC METABOLIC PANEL
Anion gap: 12 (ref 5–15)
BUN: 105 mg/dL — ABNORMAL HIGH (ref 8–23)
CO2: 22 mmol/L (ref 22–32)
Calcium: 8.4 mg/dL — ABNORMAL LOW (ref 8.9–10.3)
Chloride: 96 mmol/L — ABNORMAL LOW (ref 98–111)
Creatinine, Ser: 3.93 mg/dL — ABNORMAL HIGH (ref 0.61–1.24)
GFR, Estimated: 15 mL/min — ABNORMAL LOW (ref >60)
Glucose, Bld: 218 mg/dL — ABNORMAL HIGH (ref 70–99)
Potassium: 4.2 mmol/L (ref 3.5–5.1)
Sodium: 130 mmol/L — ABNORMAL LOW (ref 135–145)

## 2021-08-26 LAB — GLUCOSE, CAPILLARY
Glucose-Capillary: 153 mg/dL — ABNORMAL HIGH (ref 70–99)
Glucose-Capillary: 154 mg/dL — ABNORMAL HIGH (ref 70–99)
Glucose-Capillary: 174 mg/dL — ABNORMAL HIGH (ref 70–99)
Glucose-Capillary: 210 mg/dL — ABNORMAL HIGH (ref 70–99)

## 2021-08-26 LAB — TROPONIN I (HIGH SENSITIVITY): Troponin I (High Sensitivity): 28 ng/L — ABNORMAL HIGH (ref <18)

## 2021-08-26 LAB — PROTIME-INR
INR: 2.2 — ABNORMAL HIGH (ref 0.8–1.2)
Prothrombin Time: 24.5 seconds — ABNORMAL HIGH (ref 11.4–15.2)

## 2021-08-26 LAB — APTT: aPTT: 39 seconds — ABNORMAL HIGH (ref 24–36)

## 2021-08-26 LAB — MAGNESIUM: Magnesium: 2.9 mg/dL — ABNORMAL HIGH (ref 1.7–2.4)

## 2021-08-26 MED ORDER — MORPHINE SULFATE (PF) 2 MG/ML IV SOLN
2.0000 mg | INTRAVENOUS | Status: DC | PRN
Start: 2021-08-26 — End: 2021-08-26
  Administered 2021-08-26: 2 mg via INTRAVENOUS
  Filled 2021-08-26: qty 1

## 2021-08-26 MED ORDER — BIOTENE DRY MOUTH MT LIQD: 15.0000 mL | OROMUCOSAL | Status: DC | PRN

## 2021-08-26 MED ORDER — FLUCONAZOLE IN SODIUM CHLORIDE 200-0.9 MG/100ML-% IV SOLN
200.0000 mg | Freq: Once | INTRAVENOUS | Status: AC
  Administered 2021-08-26: 200 mg via INTRAVENOUS
  Filled 2021-08-26: qty 100

## 2021-08-26 MED ORDER — CHLORHEXIDINE GLUCONATE 0.12 % MT SOLN
15.0000 mL | Freq: Two times a day (BID) | OROMUCOSAL | Status: DC
  Administered 2021-08-26: 15 mL via OROMUCOSAL
  Filled 2021-08-26: qty 15

## 2021-08-26 MED ORDER — HYDROMORPHONE BOLUS VIA INFUSION
0.5000 mg | INTRAVENOUS | Status: DC | PRN
  Administered 2021-08-26 – 2021-08-27 (×2): 0.5 mg via INTRAVENOUS
  Filled 2021-08-26: qty 1

## 2021-08-26 MED ORDER — GLYCOPYRROLATE 0.2 MG/ML IJ SOLN: 0.2000 mg | INTRAMUSCULAR | Status: DC | PRN

## 2021-08-26 MED ORDER — AMIODARONE HCL IN DEXTROSE 360-4.14 MG/200ML-% IV SOLN: 30.0000 mg/h | INTRAVENOUS | Status: DC

## 2021-08-26 MED ORDER — SODIUM CHLORIDE 0.9 % IV SOLN
0.5000 mg/h | INTRAVENOUS | Status: DC
  Administered 2021-08-26: 0.5 mg/h via INTRAVENOUS
  Filled 2021-08-26: qty 5

## 2021-08-26 MED ORDER — POLYVINYL ALCOHOL 1.4 % OP SOLN
1.0000 [drp] | Freq: Four times a day (QID) | OPHTHALMIC | Status: DC | PRN
  Filled 2021-08-26: qty 15

## 2021-08-26 MED ORDER — NALOXONE HCL 0.4 MG/ML IJ SOLN
INTRAMUSCULAR | Status: AC
  Filled 2021-08-26: qty 1

## 2021-08-26 MED ORDER — HYDROMORPHONE HCL 1 MG/ML IJ SOLN: 1.0000 mg | INTRAMUSCULAR | Status: DC

## 2021-08-26 MED ORDER — FUROSEMIDE 10 MG/ML IJ SOLN
40.0000 mg | Freq: Four times a day (QID) | INTRAMUSCULAR | Status: DC
  Administered 2021-08-26: 40 mg via INTRAVENOUS
  Filled 2021-08-26: qty 4

## 2021-08-26 MED ORDER — AMIODARONE LOAD VIA INFUSION
150.0000 mg | Freq: Once | INTRAVENOUS | Status: AC
  Administered 2021-08-26: 150 mg via INTRAVENOUS
  Filled 2021-08-26: qty 83.34

## 2021-08-26 MED ORDER — HYDROMORPHONE HCL 1 MG/ML IJ SOLN
0.5000 mg | INTRAMUSCULAR | Status: AC
  Administered 2021-08-26: 0.5 mg via INTRAVENOUS
  Filled 2021-08-26: qty 1

## 2021-08-26 MED ORDER — ORAL CARE MOUTH RINSE
15.0000 mL | Freq: Two times a day (BID) | OROMUCOSAL | Status: DC
  Administered 2021-08-26: 15 mL via OROMUCOSAL

## 2021-08-26 MED ORDER — GLYCOPYRROLATE 1 MG PO TABS: 1.0000 mg | ORAL_TABLET | ORAL | Status: DC | PRN

## 2021-08-26 MED ORDER — ONDANSETRON 4 MG PO TBDP: 4.0000 mg | ORAL_TABLET | Freq: Four times a day (QID) | ORAL | Status: DC | PRN

## 2021-08-26 MED ORDER — LORAZEPAM 1 MG PO TABS: 1.0000 mg | ORAL_TABLET | ORAL | Status: DC | PRN

## 2021-08-26 MED ORDER — AMIODARONE HCL IN DEXTROSE 360-4.14 MG/200ML-% IV SOLN
60.0000 mg/h | INTRAVENOUS | Status: DC
  Administered 2021-08-26: 60 mg/h via INTRAVENOUS
  Filled 2021-08-26: qty 200

## 2021-08-26 MED ORDER — FENTANYL CITRATE (PF) 100 MCG/2ML IJ SOLN
25.0000 ug | INTRAMUSCULAR | Status: DC | PRN
  Administered 2021-08-26: 50 ug via INTRAVENOUS
  Administered 2021-08-26: 25 ug via INTRAVENOUS
  Filled 2021-08-26 (×3): qty 2

## 2021-08-26 MED ORDER — LORAZEPAM 2 MG/ML IJ SOLN
1.0000 mg | INTRAMUSCULAR | Status: AC
  Administered 2021-08-26: 1 mg via INTRAVENOUS
  Filled 2021-08-26: qty 1

## 2021-08-26 MED ORDER — AMIODARONE IV BOLUS ONLY 150 MG/100ML
150.0000 mg | Freq: Once | INTRAVENOUS | Status: AC
  Administered 2021-08-26: 150 mg via INTRAVENOUS
  Filled 2021-08-26: qty 100

## 2021-08-26 MED ORDER — HALOPERIDOL LACTATE 5 MG/ML IJ SOLN
2.0000 mg | INTRAMUSCULAR | Status: DC | PRN
  Filled 2021-08-26: qty 1

## 2021-08-26 MED ORDER — ONDANSETRON HCL 4 MG/2ML IJ SOLN: 4.0000 mg | Freq: Four times a day (QID) | INTRAMUSCULAR | Status: DC | PRN

## 2021-08-26 MED ORDER — LORAZEPAM 2 MG/ML IJ SOLN
1.0000 mg | INTRAMUSCULAR | Status: DC | PRN
  Administered 2021-08-26 – 2021-08-27 (×2): 1 mg via INTRAVENOUS
  Filled 2021-08-26 (×2): qty 1

## 2021-08-26 MED ORDER — LORAZEPAM 2 MG/ML PO CONC: 1.0000 mg | ORAL | Status: DC | PRN

## 2021-08-26 MED ORDER — GLYCOPYRROLATE 0.2 MG/ML IJ SOLN
0.2000 mg | INTRAMUSCULAR | Status: DC | PRN
  Administered 2021-08-27: 0.2 mg via INTRAVENOUS
  Filled 2021-08-26: qty 1

## 2021-08-26 MED ORDER — FLUCONAZOLE 100MG IVPB: 100.0000 mg | INTRAVENOUS | Status: DC

## 2021-08-26 NOTE — Progress Notes (Signed)
  Amiodarone Drug - Drug Interaction Consult Note  80 yo male on amiodarone PTA that was initially held on admit due to concern for contributing to difficulty breathing.  Patient remains in atrial fibrillation, so amiodarone drip was initiated 08/26/21.  Recommendations: - Rosuvastatin - monitor for muscle cramping. Patient was maintained on rosuvastatin PTA.  - Rivaroxaban - dose reduced to 15mg  PO once daily for CrCl 15-50 mL/min. Continue to monitor renal function and signs/symptoms of bleed.  Patient maintained on rivaroxaban PTA. - Fluconazole - Qtc this AM 433, electrolytes wnl. Recommended switching fluconazole to oral nystatin. If MD would like to continue fluconazole, recommend close Qtc monitoring and electrolyte replacement as needed.  Amiodarone is metabolized by the cytochrome P450 system and therefore has the potential to cause many drug interactions. Amiodarone has an average plasma half-life of 50 days (range 20 to 100 days).   There is potential for drug interactions to occur several weeks or months after stopping treatment and the onset of drug interactions may be slow after initiating amiodarone.   [x]  Statins: Increased risk of myopathy. Simvastatin- restrict dose to 20mg  daily. Other statins: counsel patients to report any muscle pain or weakness immediately.  [x]  Anticoagulants: Amiodarone can increase anticoagulant effect. Consider warfarin dose reduction. Patients should be monitored closely and the dose of anticoagulant altered accordingly, remembering that amiodarone levels take several weeks to stabilize.  []  Antiepileptics: Amiodarone can increase plasma concentration of phenytoin, the dose should be reduced. Note that small changes in phenytoin dose can result in large changes in levels. Monitor patient and counsel on signs of toxicity.  []  Beta blockers: increased risk of bradycardia, AV block and myocardial depression. Sotalol - avoid concomitant use.  []   Calcium  channel blockers (diltiazem and verapamil): increased risk of bradycardia, AV block and myocardial depression.  []   Cyclosporine: Amiodarone increases levels of cyclosporine. Reduced dose of cyclosporine is recommended.  []  Digoxin dose should be halved when amiodarone is started.  []  Diuretics: increased risk of cardiotoxicity if hypokalemia occurs.  []  Oral hypoglycemic agents (glyburide, glipizide, glimepiride): increased risk of hypoglycemia. Patient's glucose levels should be monitored closely when initiating amiodarone therapy.   [x]  Drugs that prolong the QT interval:  Torsades de pointes risk may be increased with concurrent use - avoid if possible.  Monitor QTc, also keep magnesium/potassium WNL if concurrent therapy can't be avoided.  Antibiotics: e.g. fluoroquinolones, erythromycin.  Antiarrhythmics: e.g. quinidine, procainamide, disopyramide, sotalol.  Antipsychotics: e.g. phenothiazines, haloperidol.   Lithium, tricyclic antidepressants, and methadone.  Thank You,   Dimple Nanas, PharmD 08/26/2021 1:01 PM

## 2021-08-26 NOTE — Progress Notes (Signed)
eLink Physician-Brief Progress Note Patient Name: Derek [name] DOB: [birth year] MRN: [id number]   Date of Service  08/26/2021  HPI/Events of Note  Nursing concern about ventricular rate = 130's. However, ventricular rate has been 90-115 while I have been watching the bedside monitor. K+ = 4.2 and Mg++ = 2.9.  eICU Interventions  Plan: Continue to observe for now. If HR consistently > 115, would consider another Amiodarone IV bolus and infusion.      Intervention Category Major Interventions: Arrhythmia - evaluation and management  Lorne Winkels [place] 08/26/2021, 2:49 AM

## 2021-08-26 NOTE — Progress Notes (Signed)
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  Dyspnea had been going on for last 3 months.    Patient's reported fever of 103 Fahrenheit at home.  He was diagnosed with COVID back in February but continues to be positive.  He is off chemotherapy for the last 3 months due to persistent respiratory issues, history of recent weight loss.  On presentation he was afebrile, normotensive but hypoxic on room air with 85% saturation.  He was placed on 4 L of nasal cannula.  Lab work showed pancytopenia. CT chest showed new moderate right pleural effusion, diffuse right peripheral solid and groundglass nodular densities representing infectious versus neoplastic process.  Started on azithromycin and ceftriaxone for pneumonia.  Also started on Lasix IV for severe volume overload, echocardiogram ordered to rule out CHF.  Hospital course remarkable for worsening respiratory status, requiring 15 L of oxygen in the AM of 08/22/21.  Patient  transferred to stepdown.  PCCM and cardiology consulted and following.Oncology also following.  Palliative care also consulted for goals of care.  Assessment & Plan:   Principal Problem:   Acute respiratory failure with hypoxia (HCC) Active Problems:   Diabetes mellitus, type II (HCC)   Diffuse large B cell lymphoma (HCC)   Thrombocytopenia (HCC)   Chronic kidney disease (CKD), 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HCC)   PAF (paroxysmal atrial fibrillation) (HCC)   COPD (chronic obstructive pulmonary disease) ([place])   Community acquired pneumonia   Pleural effusion   Leukopenia   Neutropenia (HCC)    Hypothyroidism   Acute respiratory acidosis (HCC)   Malnutrition of moderate degree   Pressure injury of skin   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HCC)   Acute hypoxic respiratory failure Multifactorial  Hypoxic on presentation, not on home O2, currently requiring about 15L of O2, with significant agitation, moaning and encephalopathy Thought to be secondary to possible right-sided pneumonia/right-sided pleural effusion vs radiation/covid pneumonitis versus progression of lymphoma.  Also has history of COPD/follows with Dr. [name]. Takes prednisone chronically CT chest also showed diffuse right peripheral solid and groundglass nodular densities representing infectious versus neoplastic process High-resolution CT scan showed findings of interstitial lung disease.  Also showed moderate right pleural effusion with atelectasis of the lower lobe. Completed ceftriaxone, azithromycin Due to declining respiratory status, patient was transitioned to comfort care  Lymphoma/hypogammaglobinemia His pulmonary findings could be secondary to progression of lymphoma.  Received IVIG  Congestive heart failure/volume overload Currently appears euvolemic Had severe bilateral lower extremity edema. Elevated  BNP, started on  Lasix 80 mg IV twice a day.  Echo done here showed inferior/inferoseptal wall hypokinesis, moderate left ventricular hypertrophy, low normal right ventricular systolic function, moderate mitral valve regurgitation Cardiology consulted, lasix discontinued due to worsening renal status  AKI on CKD stage IV Creatinine worsening Baseline creatinine around 2.2  Diabetes type 2 with hyperglycemia  Likely worsened by steroids Adjusted insulin regimen, SSI, NovoLog 3 times daily, glargine, now d/ced as pt is comfort care  Hyponatremia Multifactorial  Pancytopenia Secondary to history of large B-cell lymphoma/chemotherapy  History of paroxysmal A. fib/a flutter Was on Xarelto, metoprolol  and amiodarone, d/ced  History of coronary artery disease  Hypothyroidism  Debility/deconditioning/weakness: Has been very weak recently, now comfort care  Obesity: BMI 32.6  Goals of care: 80 year old male who presents with acute hypoxic respiratory failure, found to have progression of lymphoma, currently on 15 L of oxygen.  Poor prognosis.  Goals of care discussed, Code changed to DNR/DNI as per patient's wishes.  Palliative care consulted, eventually made comfort care  Nutrition Problem: Moderate Malnutrition Etiology: chronic illness, cancer and cancer related treatments      DVT prophylaxis:Comfort care Code Status: DNR Family Communication: Discussed with significant other Patient status:Inpatient  Dispo: The patient is from: Home              Anticipated d/c is to: Expect hospital death               Consultants:  Oncology PCCM Cardiology  Procedures:None  Antimicrobials:  Anti-infectives (From admission, onward)    Start     Dose/Rate Route Frequency Ordered Stop   09/11/2021 1000  fluconazole (DIFLUCAN) IVPB 100 mg  Status:  Discontinued        100 mg 50 mL/hr over 60 Minutes Intravenous Every 24 hours 08/26/21 1042 08/26/21 1429   08/26/21 1130  fluconazole (DIFLUCAN) IVPB 200 mg        200 mg 100 mL/hr over 60 Minutes Intravenous  Once 08/26/21 1042 08/26/21 1333   08/24/21 2000  cefTRIAXone (ROCEPHIN) 2 g in sodium chloride 0.9 % 100 mL IVPB        2 g 200 mL/hr over 30 Minutes Intravenous Every 24 hours 08/24/21 1221 08/24/21 2246   08/24/21 1315  azithromycin (ZITHROMAX) 500 mg in sodium chloride 0.9 % 250 mL IVPB  Status:  Discontinued        500 mg 250 mL/hr over 60 Minutes Intravenous Every 24 hours 08/24/21 1220 08/24/21 1220   08/19/21 2200  cefTRIAXone (ROCEPHIN) 2 g in sodium chloride 0.9 % 100 mL IVPB        2 g 200 mL/hr over 30 Minutes Intravenous Every 24 hours 08/05/2021 2314 08/23/21 2209   08/19/21 2000  azithromycin (ZITHROMAX) 500 mg in  sodium chloride 0.9 % 250 mL IVPB        500 mg 250 mL/hr over 60 Minutes Intravenous Every 24 hours 07/31/2021 2314 08/23/21 2103   07/22/2021 2030  cefTRIAXone (ROCEPHIN) 1 g in sodium chloride 0.9 % 100 mL IVPB        1 g 200 mL/hr over 30 Minutes Intravenous  Once 08/07/2021 2018 08/17/2021 2117   07/31/2021 2030  azithromycin (ZITHROMAX) 500 mg in sodium chloride 0.9 % 250 mL IVPB        500 mg 250 mL/hr over 60 Minutes Intravenous  Once 08/06/2021 2018 08/05/2021 2321       Subjective: Seen and examined at bedside, continues to be significantly hypoxic requiring about 50 L of oxygen, noted to be somewhat restless, moaning in pain.  Noted multiple sores in mouth.  Restless.  Transitioned to comfort care   Objective: Vitals:   08/26/21 1056 08/26/21 1100 08/26/21 1300 08/26/21 1303  BP: 100/66 120/72    Pulse: (!) 25 (!) 54    Resp: 16 17    Temp:      TempSrc:      SpO2: (!) 88% (!) 88% (!) 84% 91%  Weight:      Height:        Intake/Output Summary (Last 24 hours) at 08/26/2021 1453 Last data filed at 08/26/2021 1450 Gross per 24 hour  Intake  318.33 ml  Output 410 ml  Net -91.67 ml   Filed Weights   08/19/21 1526 08/22/21 1005  Weight: 123.4 kg 121.5 kg    Examination: General: NAD, chronically ill-appearing, deconditioned, restless, moaning, multiple oral sores Cardiovascular: S1, S2 present Respiratory: Coarse breath sounds bilaterally Abdomen: Soft, nontender, nondistended, bowel sounds present Musculoskeletal: No bilateral pedal edema noted Skin: Normal Psychiatry: Agitated mood    Data Reviewed: I have personally reviewed following labs and imaging studies  CBC: Recent Labs  Lab 08/21/21 0432 08/22/21 0409 08/23/21 0330 08/24/21 0343 08/26/21 0141  WBC 0.9* 1.8* 3.7* 5.3 6.6  NEUTROABS 0.2* 0.9* 2.2 4.5 5.1  HGB 10.2* 10.4* 11.0* 10.2* 11.5*  HCT 31.8* 32.7* 34.0* 31.7* 34.9*  MCV 85.5 84.9 83.1 84.1 83.3  PLT 115* 107* 104* 93* 94*   Basic Metabolic  Panel: Recent Labs  Lab 08/24/21 0343 08/24/21 2227 08/25/21 0400 08/25/21 1023 08/26/21 0020  NA 127* 128* 129* 128* 130*  K 4.3 4.5 4.6 4.2 4.2  CL 92* 92* 94* 93* 96*  CO2 23 21* 21* 21* 22  GLUCOSE 391* 479* 511* 424* 218*  BUN 64* 78* 88* 91* 105*  CREATININE 2.55* 3.05* 3.38* 3.58* 3.93*  CALCIUM 8.0* 8.1* 8.1* 8.2* 8.4*  MG  --   --   --   --  2.9*   GFR: Estimated Creatinine Clearance: 21.7 mL/min (A) (by C-G formula based on SCr of 3.93 mg/dL (H)). Liver Function Tests: No results for input(s): AST, ALT, ALKPHOS, BILITOT, PROT, ALBUMIN in the last 168 hours.  No results for input(s): LIPASE, AMYLASE in the last 168 hours. No results for input(s): AMMONIA in the last 168 hours. Coagulation Profile: Recent Labs  Lab 08/26/21 0114  INR 2.2*   Cardiac Enzymes: No results for input(s): CKTOTAL, CKMB, CKMBINDEX, TROPONINI in the last 168 hours. BNP (last 3 results) No results for input(s): PROBNP in the last 8760 hours. HbA1C: No results for input(s): HGBA1C in the last 72 hours. CBG: Recent Labs  Lab 08/25/21 2240 08/26/21 0014 08/26/21 0351 08/26/21 0805 08/26/21 1134  GLUCAP 239* 210* 153* 154* 174*   Lipid Profile: No results for input(s): CHOL, HDL, LDLCALC, TRIG, CHOLHDL, LDLDIRECT in the last 72 hours. Thyroid Function Tests: No results for input(s): TSH, T4TOTAL, FREET4, T3FREE, THYROIDAB in the last 72 hours. Anemia Panel: No results for input(s): VITAMINB12, FOLATE, FERRITIN, TIBC, IRON, RETICCTPCT in the last 72 hours. Sepsis Labs: Recent Labs  Lab 08/22/21 1340 08/23/21 0330 08/24/21 0343  PROCALCITON 0.21 0.33 0.43    Recent Results (from the past 240 hour(s))  Resp Panel by RT-PCR (Flu A&B, Covid) Nasopharyngeal Swab     Status: Abnormal   Collection Time: 07/23/2021  3:52 PM   Specimen: Nasopharyngeal Swab; Nasopharyngeal(NP) swabs in vial transport medium  Result Value Ref Range Status   SARS Coronavirus 2 by RT PCR POSITIVE (A)  NEGATIVE Final    Comment: CRITICAL RESULT CALLED TO, READ BACK BY AND VERIFIED WITH: RN N WHEATLEY AT 1957 08/01/2021 CRUICKSHANK A (NOTE) SARS-CoV-2 target nucleic acids are DETECTED.  The SARS-CoV-2 RNA is generally detectable in upper respiratory specimens during the acute phase of infection. Positive results are indicative of the presence of the identified virus, but do not rule out bacterial infection or co-infection with other pathogens not detected by the test. Clinical correlation with patient history and other diagnostic information is necessary to determine patient infection status. The expected result is Negative.  Fact Sheet for Patients: EntrepreneurPulse.com.au  Fact Sheet for Healthcare Providers: IncredibleEmployment.be  This test is not yet approved or cleared by the [place] FDA and  has been authorized for detection and/or diagnosis of SARS-CoV-2 by FDA under an Emergency Use Authorization (EUA).  This EUA will remain in effect (mean ing this test can be used) for the duration of  the COVID-19 declaration under Section 564(b)(1) of the Act, 21 U.S.C. section 360bbb-3(b)(1), unless the authorization is terminated or revoked sooner.     Influenza A by PCR NEGATIVE NEGATIVE Final   Influenza B by PCR NEGATIVE NEGATIVE Final    Comment: (NOTE) The Xpert Xpress SARS-CoV-2/FLU/RSV plus assay is intended as an aid in the diagnosis of influenza from Nasopharyngeal swab specimens and should not be used as a sole basis for treatment. Nasal washings and aspirates are unacceptable for Xpert Xpress SARS-CoV-2/FLU/RSV testing.  Fact Sheet for Patients: EntrepreneurPulse.com.au  Fact Sheet for Healthcare Providers: IncredibleEmployment.be  This test is not yet approved or cleared by the [place] FDA and has been authorized for detection and/or diagnosis of SARS-CoV-2 by FDA under an  Emergency Use Authorization (EUA). This EUA will remain in effect (meaning this test can be used) for the duration of the COVID-19 declaration under Section 564(b)(1) of the Act, 21 U.S.C. section 360bbb-3(b)(1), unless the authorization is terminated or revoked.  Performed at [facility], [place] [address] [place], [place] [address]   Culture, blood (Routine X 2) w Reflex to ID Panel     Status: None   Collection Time: 08/11/2021 10:02 PM   Specimen: BLOOD  Result Value Ref Range Status   Specimen Description   Final    BLOOD PORTA CATH Performed at [facility] [address] [place], [place] [address]    Special Requests   Final    BOTTLES DRAWN AEROBIC AND ANAEROBIC Blood Culture adequate volume Performed at [facility] [address] [place], [place] [address]    Culture   Final    NO GROWTH 5 DAYS Performed at [facility], [place] [address] [place], [place] [address]    Report Status 08/24/2021 FINAL  Final  Culture, blood (Routine X 2) w Reflex to ID Panel     Status: None   Collection Time: 08/19/21  4:24 AM   Specimen: BLOOD  Result Value Ref Range Status   Specimen Description   Final    BLOOD PORTA CATH Performed at [facility] [address], [place], [place] [address]    Special Requests   Final    BOTTLES DRAWN AEROBIC AND ANAEROBIC Blood Culture results may not be optimal due to an excessive volume of blood received in culture bottles Performed at [facility] [address] [place], [place] [address]    Culture   Final    NO GROWTH 5 DAYS Performed at [facility], [place] [address], [place], [place] [address]    Report Status 08/24/2021 FINAL  Final  MRSA Next Gen by PCR, Nasal     Status: None   Collection Time: 08/22/21  9:27 AM   Specimen: Nasal Mucosa; Nasal Swab  Result Value Ref Range Status   MRSA by PCR Next Gen NOT DETECTED NOT DETECTED  Final    Comment: (NOTE) The GeneXpert MRSA Assay (FDA approved for NASAL specimens only), is one component of a comprehensive MRSA colonization surveillance program. It is not intended to diagnose MRSA infection nor to guide or  monitor treatment for MRSA infections. Test performance is not FDA approved in patients less than 25 years old. Performed at [facility], [place] [address], [place], [place] [address]   Respiratory (~20 pathogens) panel by PCR     Status: None   Collection Time: 08/22/21  1:40 PM   Specimen: Nasopharyngeal Swab; Respiratory  Result Value Ref Range Status   Adenovirus NOT DETECTED NOT DETECTED Final   Coronavirus 229E NOT DETECTED NOT DETECTED Final    Comment: (NOTE) The Coronavirus on the Respiratory Panel, DOES NOT test for the novel  Coronavirus (2019 nCoV)    Coronavirus HKU1 NOT DETECTED NOT DETECTED Final   Coronavirus NL63 NOT DETECTED NOT DETECTED Final   Coronavirus OC43 NOT DETECTED NOT DETECTED Final   Metapneumovirus NOT DETECTED NOT DETECTED Final   Rhinovirus / Enterovirus NOT DETECTED NOT DETECTED Final   Influenza A NOT DETECTED NOT DETECTED Final   Influenza B NOT DETECTED NOT DETECTED Final   Parainfluenza Virus 1 NOT DETECTED NOT DETECTED Final   Parainfluenza Virus 2 NOT DETECTED NOT DETECTED Final   Parainfluenza Virus 3 NOT DETECTED NOT DETECTED Final   Parainfluenza Virus 4 NOT DETECTED NOT DETECTED Final   Respiratory Syncytial Virus NOT DETECTED NOT DETECTED Final   Bordetella pertussis NOT DETECTED NOT DETECTED Final   Bordetella Parapertussis NOT DETECTED NOT DETECTED Final   Chlamydophila pneumoniae NOT DETECTED NOT DETECTED Final   Mycoplasma pneumoniae NOT DETECTED NOT DETECTED Final    Comment: Performed at [facility], [place]. [address], [place], [place] [address]  Body fluid culture w Gram Stain     Status: None (Preliminary result)   Collection Time: 08/23/21  3:04 PM   Specimen: PATH Cytology  Pleural fluid  Result Value Ref Range Status   Specimen Description   Final    PLEURAL Performed at [facility] [address], [place], [place] [address]    Special Requests   Final    NONE Performed at [facility], [place] [address] [place], [place] [address]    Gram Stain   Final    RARE WBC PRESENT, PREDOMINANTLY MONONUCLEAR NO ORGANISMS SEEN    Culture   Final    NO GROWTH 3 DAYS Performed at [facility], [place] [address], [place],  [address]    Report Status PENDING  Incomplete         Radiology Studies: DG CHEST PORT 1 VIEW  Result Date: 08/26/2021 CLINICAL DATA:  Increasing shortness of breath EXAM: PORTABLE CHEST 1 VIEW COMPARISON:  08/23/2021 FINDINGS: Cardiac shadow is stable. Right-sided chest wall port is noted. Increasing density in the right hemithorax is noted consistent with recurrent pleural effusion. Left lung remains clear. No bony abnormality is noted. IMPRESSION: Increasing density in the right hemithorax consistent with recurrent pleural effusion. Electronically Signed   By: Inez Catalina M.D.   On: 08/26/2021 00:46        Scheduled Meds:  bisacodyl  10 mg Rectal Once   chlorhexidine  15 mL Mouth Rinse BID   furosemide  40 mg Intravenous Q6H   LORazepam  1 mg Intravenous NOW   mouth rinse  15 mL Mouth Rinse q12n4p   naloxone       Continuous Infusions:  sodium chloride Stopped (08/22/21 2319)   HYDROmorphone       LOS: 8 days      [place] Friendly, MD [facility] P12/04/2021, 2:53 PM

## 2021-08-26 NOTE — Progress Notes (Signed)
[facility] notified of respiratory distress, afib with rate 110-120s, and pt complaint of pain 8/10. Orders received.

## 2021-08-26 NOTE — Telephone Encounter (Signed)
Will forward to Dr Skains for his knowledge.  °

## 2021-08-26 NOTE — Progress Notes (Signed)
PT Cancellation Note  Patient Details Name: ZAYVIEN CANNING MRN: [id number] DOB: 20-Apr-1941   Cancelled Treatment:    Reason Eval/Treat Not Completed: Medical issues which prohibited therapy, worsening respiratory per RN. Will check back 12/12.   Claretha Cooper 08/26/2021, 1:48 PM Tresa Endo PT Acute Rehabilitation Services Pager [phone] Office [phone]

## 2021-08-26 NOTE — Progress Notes (Signed)
VAST consult received to change port needle. Upon arrival at patient's bedside, patient's nurse asked if we could wait until after family/physician conference at 1400. Unit RN to place new consult when ready for needle change.

## 2021-08-26 NOTE — Progress Notes (Signed)
NAME:  Derek [name], MRN:  [id number], DOB:  1940-11-29, LOS: 66 ADMISSION DATE:  08/12/2021, CONSULTATION DATE:  08/22/21 REFERRING MD:  Dr Shelly Coss, CHIEF COMPLAINT:  resp failure   History of Present Illness:  80 year old obese male with a former greater than 30 pack smoker, COPD , atrial fibrillation/flutter on Xarelto and amiodarone, chronic kidney disease stage IV, hypertension, type 2 diabetes, obesity, hypothyroidism and hyperlipidemia.  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  Treated with chemotherapy and Rituxan.  60 pound weight loss in the last 8 months.  He also has worsening shortness of breath for the last 3 months.  During these 3 months he has been off his chemotherapy.   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  Had CT-guided biopsy 03/08/2021 that shows atypical lymphoid infiltrate consistent with non-Hodgkin B-cell lymphoma 05/2021 PET scans do seem to be slightly getting better.  However, that is progressively worse mid November 2022 CT scan 06/2021 Started on chronic Prednisone by hematology  07/21/2021 office pulm consult with Dr. Baltazar Apo concerned about drug-induced lung injury versus long COVID residual infiltrates.  Plan was to continue prednisone for another month and then taper to 10 mg for a month and then slowly taper off. 07/23/2021 admitted with fever of 103, hypoxemic on RA, with pancytopenia. CT chest at admission shows new onset moderate right oral effusion [too small for thoracentesis per ultrasound], right lower lobe atelectasis airspace disease worrisome for infection and new diffuse right lung peripheral solid and groundglass nodular densities along with groundglass and interstitial opacities in the left lung.     Past Medical History:  CLL (chronic lymphocytic leukemia)  Diabetes  mellitus, type II Dilated cardiomyopathy  GERD  Hypertension Hypertriglyceridemia Lymphocytosis Obesity  Significant Hospital Events:  07/24/2021 - admit 12/5 Continued 15L HFNC 12/7 Improving slightly after initiation of steroids and after thoracentesis  12/8 restless overnight with slight increased work of breathing, unable to tolerate BiPAP  Interim History / Subjective:  States he feels very poor today with inability to rest comfortably Chest x-ray with worsening right pleural effusion Wife at bedside and updated  Objective   Blood pressure 106/78, pulse (!) 111, temperature (!) 97.2 F (36.2 C), temperature source Axillary, resp. rate (!) 24, height 6\' 4"  (1.93 m), weight 121.5 kg, SpO2 92 %.        Intake/Output Summary (Last 24 hours) at 08/26/2021 0715 Last data filed at 08/26/2021 9924 Gross per 24 hour  Intake 82.34 ml  Output 810 ml  Net -727.66 ml    Filed Weights   08/19/21 1526 08/22/21 1005  Weight: 123.4 kg 121.5 kg   Physical; Exam General: Acute on chronically ill appearing elderly male lying in bed, in NAD HEENT: [place]/AT, MM pink/moist, PERRL,  Neuro: Alert and oriented x3, Non-focal  CV: s1s2 regular rate and rhythm, no murmur, rubs, or gallops,  PULM:  [place] to right, no increased work of breathing, oxygen sats appropriate on 15L HFNC GI: soft, bowel sounds active in all 4 quadrants, non-tender, non-distended, tolerating oral diet Extremities: warm/dry, no edema  Skin: no rashes or lesions  Resolved Hospital Problem list     Assessment & Plan:   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 Failure -Likely multifactorial etiology included CAP and pleural effusion with likely repeat Covid-19 infection 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 vs drug induced lung  injury from amiodarone/chemo with tobacco use history and COPD vs possibly progression of patient's lymphoma -High 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/4 consistent with UIP, R pleural effusion with airspace consolidation concerning for  RLL PNA -S/P thoracentesis 12/5 with 500cc removed Right lower lobe lung mass P: Continue Dexamethasone but Remdesivr remains contraindicated Continue HFNC with O2 sats > 92 May benefit from repeat thoracentesis for symptomatic control Mobilize as able S/p 5 days of Cefepime and Azithromycin Continue BD  BIPAP Qhs All inflammatory markers negative  Pleural cytology consistent with lymphoma   B-cell Lymphoma with pan-cytopenia Acquired Hypogammaglobulinemia  -Onset May 2022; status post CT-guided thoracic biopsy 03/09/2019 biopsy features consistent with B-cell lymphoma -Appreciate heme/Onc recommendations, pt to receive IVIG, cannot move forwards with chemotherapy until his respiratory status has improved P: Hematology/oncology following, appreciate assistance Palliative care finding, appreciate assistance Supportive care  Type 2 DM with hyperglycemia P: Continue SSI Continue long-acting CBG goal 140-180  MSK -Failure to thrive with significant weight loss P: Supplement nutrition  PT/OT as able   Atrial Fibrillation/Flutter P: Cardiology following Continue Xarelto, beta-blocker Amio remains on hold    Best practice (daily eval):  According to the hospitalist  Critical care time:  Morrissa Shein D. Kenton Kingfisher, NP-C [facility] Personal contact information can be found on Amion  08/26/2021, 8:31 AM

## 2021-08-26 NOTE — Progress Notes (Signed)
eLink Physician-Brief Progress Note Patient Name: Derek [name] DOB: 30-Aug-1941 MRN: [id number]   Date of Service  08/26/2021  HPI/Events of Note  Review of CXR reveals increasing density in the right hemithorax consistent with recurrent pleural effusion. Review of EKG reveals Atrial fibrillation with rapid ventricular response and septal infarct , age undetermined. Troponin #1 = 28. Ventricular rate with AFIB = 100-120. Currently on Cardizem CD and Xarelto. Note that the patient is a DNR/DNI and has refused BiPAP earlier this evening.     eICU Interventions  Plan: Continue to cycle Troponin. PT/INR, PTT and CBC with Platelets STAT. Will request PCCM ground team evaluate the patient. Amiodarone IV bolus only.      Intervention Category Major Interventions: Hypoxemia - evaluation and management  Alysen Smylie Eugene 08/26/2021, 12:57 AM

## 2021-08-26 NOTE — Telephone Encounter (Signed)
Patient's friend called to let us know he is currently in the hospital and he just went back into A-fib. Just wanted [place] aware.

## 2021-08-26 NOTE — Progress Notes (Signed)
Pharmacy Antibiotic Note  Derek [name] is a 80 y.o. male admitted on 07/28/2021 with acute on chronic hypoxemic respiratory failure.  Now with oropharyngeal candidiasis.  Pharmacy has been consulted for fluconazole dosing.  Plan: Fluconazole 200mg  IV x1, followed by 100mg  IV daily (Given severity of oropharyngeal candidiasis, will proceed w/ fluconazole 200mg  daily and 100mg  thereafter) Monitor clinical improvement, renal function, and length of therapy  Pharmacy will officially sign off of consult and monitor peripherally.  Height: 6\' 4"  (193 cm) Weight: 121.5 kg (267 lb 13.7 oz) IBW/kg (Calculated) : 86.8  Temp (24hrs), Avg:97.5 F (36.4 C), Min:97.2 F (36.2 C), Max:98 F (36.7 C)  Recent Labs  Lab 08/21/21 0432 08/22/21 0409 08/23/21 0330 08/24/21 0343 08/24/21 2227 08/25/21 0400 08/25/21 1023 08/26/21 0020 08/26/21 0141  WBC 0.9* 1.8* 3.7* 5.3  --   --   --   --  6.6  CREATININE 2.09* 2.05* 2.27* 2.55* 3.05* 3.38* 3.58* 3.93*  --     Estimated Creatinine Clearance: 21.7 mL/min (A) (by C-G formula based on SCr of 3.93 mg/dL (H)).    Allergies  Allergen Reactions   Lipitor [Atorvastatin] Rash   Metformin Diarrhea    Antimicrobials this admission: Azithromycin 12/1 >> 12/5 Ceftriaxone 12/1 >> 12/7  Microbiology results: 11/30 COVID+ 12/4 resp pcr: neg 12/4 mrsa pcr: neg 12/1 bcx x2: neg FINAL 12/5 pleural fluid: ngtd  Thank you for allowing pharmacy to be a part of this patient's care.  Dimple Nanas, PharmD 08/26/2021 12:09 PM

## 2021-08-26 NOTE — Progress Notes (Signed)
Daily Progress Note   Patient Name: Derek [name]       Date: 08/26/2021 DOB: 1940/10/25  Age: 80 y.o. MRN#: [id number] Attending Physician: Alma Friendly, MD Primary Care Physician: Wenda Low, MD Admit Date: 08/06/2021  Reason for Consultation/Follow-up: Establishing goals of care  Subjective: AMS - no responding to my questions - moaning, thrashing in bed - high amounts of O2 (NRB and HFNC) with sats in 80s  Length of Stay: 8  Current Medications: Scheduled Meds:   (feeding supplement) PROSource Plus  30 mL Oral BID BM   bisacodyl  10 mg Rectal Once   chlorhexidine  15 mL Mouth Rinse BID   Chlorhexidine Gluconate Cloth  6 each Topical Daily   feeding supplement  237 mL Oral BID BM   furosemide  40 mg Intravenous Q6H    HYDROmorphone (DILAUDID) injection  0.5 mg Intravenous NOW   insulin aspart  0-20 Units Subcutaneous Q4H   insulin aspart  5 Units Subcutaneous TID WC   insulin glargine-yfgn  20 Units Subcutaneous BID   levothyroxine  25 mcg Oral Q0600   magic mouthwash  15 mL Oral QID   mouth rinse  15 mL Mouth Rinse q12n4p   methylPREDNISolone (SOLU-MEDROL) injection  60 mg Intravenous Q12H   metoprolol succinate  12.5 mg Oral Daily   mometasone-formoterol  2 puff Inhalation BID   multivitamin with minerals  1 tablet Oral Daily   naloxone       polyethylene glycol  17 g Oral Daily   rivaroxaban  15 mg Oral Q supper   rosuvastatin  10 mg Oral Daily   senna  1 tablet Oral BID   tamsulosin  0.4 mg Oral Daily   umeclidinium bromide  1 puff Inhalation Daily    Continuous Infusions:  sodium chloride Stopped (08/22/21 2319)   amiodarone 60 mg/hr (08/26/21 1244)   Followed by   amiodarone     [START ON Sep 21, 2021] fluconazole (DIFLUCAN) IV      PRN Meds: sodium chloride,  acetaminophen, albuterol, benzonatate, fentaNYL (SUBLIMAZE) injection, ipratropium-albuterol, metoprolol tartrate, morphine injection, oxyCODONE  Physical Exam Constitutional:      Comments: delirious  Cardiovascular:     Rate and Rhythm: Tachycardia present. Rhythm irregular.  Pulmonary:     Comments: Breathing appears labored           Vital Signs: BP 120/72   Pulse (!) 54   Temp (!) 97.2 F (36.2 C) (Axillary)   Resp 17   Ht 6\' 4"  (1.93 m)   Wt 121.5 kg   SpO2 91%   BMI 32.60 kg/m  SpO2: SpO2: 91 % O2 Device: O2 Device: NRB, High Flow Nasal Cannula O2 Flow Rate: O2 Flow Rate (L/min): 30 L/min  Intake/output summary:  Intake/Output Summary (Last 24 hours) at 08/26/2021 1419 Last data filed at 08/26/2021 0528 Gross per 24 hour  Intake 82.34 ml  Output 410 ml  Net -327.66 ml   LBM: Last BM Date: 08/20/21 Baseline Weight: Weight: 123.4 kg Most recent weight: Weight: 121.5 kg       Palliative Assessment/Data: PPS 10%    Flowsheet Rows    Flowsheet Row Most Recent Value  Intake Tab  Referral Department Hospitalist  Unit at Time of Referral ICU  Palliative Care Primary Diagnosis Cancer  Date Notified 08/24/21  Palliative Care Type New Palliative care  Reason for referral Clarify Goals of Care  Date of Admission 08/11/2021  Date first seen by Palliative Care 08/25/21  # of days Palliative referral response time 1 Day(s)  # of days IP prior to Palliative referral 6  Clinical Assessment   Psychosocial & Spiritual Assessment   Palliative Care Outcomes        Patient Active Problem List   Diagnosis Date Noted   Malnutrition of moderate degree 08/23/2021   Pressure injury of skin 08/23/2021   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[place])    Community acquired pneumonia 08/19/2021   Pleural effusion 08/19/2021   Leukopenia 08/19/2021   Neutropenia ([place]) 08/19/2021   Hypothyroidism 08/19/2021   Acute respiratory acidosis ([place]) 08/19/2021   Acute respiratory failure  with hypoxia ([place]) 08/16/2021   COPD (chronic obstructive pulmonary disease) ([place]) 07/21/2021   Atrial flutter (H[facility])    Cough in adult 04/19/2021   Abnormal CT of the chest 02/23/2021   Mass of lower lobe of left lung 02/08/2021   Pulmonary infiltrate on radiologic exam 02/08/2021   PAF (paroxysmal atrial fibrillation) (HCC)    Nodule of right lung 09/01/2020   Persistent atrial fibrillation ([place]) 08/24/2020   Secondary hypercoagulable state ([place]) 08/24/2020   Non compliance w medication regimen 08/04/2020   Chronic kidney disease (CKD), 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[place]) 06/09/2020   Acute prerenal failure ([place]) 06/01/2020   Goals of care, counseling/discussion 05/14/2020   Lymphadenopathy of head and neck 03/09/2020   Pancytopenia, acquired ([place]) 03/09/2020   DOE (dyspnea on exertion) 11/09/2018   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   Atypical atrial flutter (HCC)    Elevated serum creatinine 03/06/2018   Basal cell carcinoma (B[facility]) of scalp 03/06/2018   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   Preventive measure 06/25/2015   Hyperlipidemia 06/02/2015   Morbid obesity ([place]) complicated by aodm with low ERV  06/02/2015   Recent upper respiratory tract infection 11/18/2014   Leukopenia due to antineoplastic chemotherapy ([place]) 10/21/2014   Thrombocytopenia ([place]) 08/19/2014   Hypersensitivity reaction 06/25/2014   At high risk of tumor lysis syndrome 06/23/2014   Anemia in chronic illness 03/14/2014   Weight loss, non-intentional 03/14/2014   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 male  with past medical history of COPD, lymphoma, a fib, CKD, htn, CAD, and T2DM admitted on 08/09/2021 with fever and hypoxia. Also with pancytopenia.  Patient  is being treated for multifactorial respiratory failure d/t CAP, pleural effusion, ?repeat COVID infection, COPD exacerbation. Also with concern of progression of lymphoma. Patient requiring HFNC. Patient has not had chemo for 3 months d/t respiratory concerns and weigh loss. IVIG has been started by oncology. PMT consulted for [place] discussion.  Assessment: Patient's s/o, DIL, and family friend at bedside.  Reviewed clinical course - reviewed patient's respiratory failure, renal failure. Reviewed that patient appears to be nearing end of life despite aggressive medical treatment.  Attempted to include patient in conversation however he was unable d/t AMS.  Family asks about thoracentesis - we review that with patient's current agitation it would not be safe.  We  review comfort measures only - review use of medications to promote comfort.  DIL takes time to call patient's son and review plan with him - son requests initiation of comfort measures.  Discussed time could be quite limited - would not be surprised if time is limited to hours.  Reviewed plan to maintain patient in the hospital for now and get him comfortable - anticipate hospital death but if stabilizes could discuss hospice. Discussed with CCM. Discussed plan with Candace, RN.  Recommendations/Plan: Comfort measures only - dc any interventions not needed to promote comfort Initiate dilaudid infusion - very little relief with morphine pushes PRN ativan, haldol, robinul Family understanding of poor prognosis Anticipate hospital death  Goals of Care and Additional Recommendations: Limitations on Scope of Treatment: Full Comfort Care  Code Status: DNR  Prognosis:  Hours - Days  Discharge Planning: Anticipated Hospital Death  Care plan was discussed with patient's family, RN, CCM  Thank you for allowing the Palliative Medicine Team to assist in the care of this patient.   Total Time 55 minutes Prolonged Time Billed  no        Greater than 50%  of this time was spent counseling and coordinating care related to the above assessment and plan.  Juel Burrow, DNP, [facility] Palliative Medicine Team Team Phone # [phone]  Pager [phone]

## 2021-08-26 NOTE — Progress Notes (Signed)
eLink Physician-Brief Progress Note Patient Name: Derek [name] DOB: July 07, 1941 MRN: [id number]   Date of Service  08/26/2021  HPI/Events of Note  Patient still c/o back pain.  eICU Interventions  Plan: Increase Fentanyl dose to 25-50 mcg IV Q 2 hours PRN pain.      Intervention Category Major Interventions: Other:  Lysle Dingwall 08/26/2021, 4:52 AM

## 2021-08-26 NOTE — Progress Notes (Signed)
Nutrition Brief Note  Patient seen by this RD on 12/2. Chart reviewed. Patient discussed in rounds this AM and with RN one-on-one after rounds. Patient now transitioning to comfort care.    No further nutrition interventions planned at this time. Please re-consult as needed.       Jarome Matin, MS, RD, LDN, CNSC Inpatient Clinical Dietitian RD pager # available in [place]  After hours/weekend pager # available in [facility]

## 2021-08-27 LAB — BODY FLUID CULTURE W GRAM STAIN: Culture: NO GROWTH

## 2021-09-07 LAB — MISC LABCORP TEST (SEND OUT): Labcorp test code: 9985
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
## 2021-09-19 NOTE — Progress Notes (Addendum)
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 and previous treatment Status post IVIG on 12/5 I estimated 50% chance that it might help him clear lingering infection Unfortunately, he has not improved and is now on comfort measures  Acute hypoxic respiratory failure secondary to possible pneumonia/right pleural effusion Respiratory status did not improve with aggressive interventions and he is now on comfort   Pancytopenia Labs have been discontinued due to comfort measures  chronic kidney disease (CKD), 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HCC) No longer following labs  Paroxysmal atrial fibrillation/atrial flutter On medications for comfort only  Goals of care discussion Patient actively dying Palliative care following and anticipate hospital death  SUBJECTIVE: Has been made comfort measures On Dilaudid drip and is unresponsive Significant other at bedside  Oncology History Overview Note  Normal FISH for CLL DLBCL transformed from CLL; c-myc by FISH and other rearrangements were not detected   Diffuse large B cell lymphoma ([place])  09/10/2013 Initial Diagnosis   CLL (chronic lymphocytic leukemia)   12/26/2013 Pathology Results   FISH analysis was normal.   06/17/2014 Procedure   The patient has placement of Infuse-a-Port.   06/18/2014 Imaging   CT scan of the chest, abdomen and pelvis showed diffuse lymphadenopathy and splenomegaly.   06/19/2014 Bone Marrow Biopsy   Bone marrow aspirate and biopsy show CLL.   06/24/2014 - 11/18/2014 Chemotherapy   He received 6  cycles of Obinutuzumab and chlorambucil   09/15/2014 Imaging   Repeat CT scan of the chest, abdomen and pelvis show greater than 50% reduction in lymphadenopathy and splenomegaly   12/19/2014 Imaging   Repeat CT scan showed complete resolution of lymphadenopathy and splenomegaly.   04/22/2020 Imaging   CT neck 1. Cervical adenopathy involving the bilateral level 1B, right 2/3 and bilateral level 5 nodal stations. 2. Prominent to mildly enlarged upper mediastinal nodes. 3. Prominent subcentimeter right intraparotid node.     06/03/2020 -  Chemotherapy   The patient had rituximab for chemotherapy treatment.     08/31/2020 Imaging   CT neck 1. Regression of Leukemia. Resolved widespread cervical lymphadenopathy since August. Largest residual left level IIIb node now 8-9 mm short axis (previously 16 mm). 2.  CT Chest, Abdomen, and Pelvis today are reported separately.     08/31/2020 Imaging   Limited evaluation due to lack of intravenous contrast administration.   7.1 cm left perirenal soft tissue mass along the posterior left upper kidney, suspicious for perirenal lymphoma, although poorly evaluated. This is essentially new from May 2021.   Possible periureteral soft tissue along the right proximal collecting system/ureter, equivocal.   New 12 mm right lower lobe pulmonary nodule, suspicious for metastasis/pulmonary lymphoma. Additional 6 mm (mean diameter) subpleural nodule in the lingula is new from 2016, indeterminate.   No suspicious lymphadenopathy in the chest, abdomen, or pelvis. Spleen is normal in size.     02/05/2021 Imaging   1. Interval enlargement of multiple subcutaneous soft tissue nodules or lymph nodes, in the left axilla and overlying the sacrum and left buttock. 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 2. Significant interval increase  in size of right lower lobe and lingular pulmonary nodules, now with large subpleural opacities, consistent with worsened  pulmonary lymphomatous involvement. 3. Interval decrease in size of a left perinephric soft tissue mass.  4. Constellation of findings above suggest mixed response to treatment. 5. 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 No clear evidence of honeycombing. 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 [employer], Iss 5, [id number], May 20 2017. 6. Coronary artery disease.   02/05/2021 Pathology Results   A. 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  The appearance is diffuse with lack of atypical follicles.  Flow cytometric analysis was attempted ([id number]) but there were insufficient cells present in the sample for analysis. Hence a battery of immunohistochemical stains was performed and shows that the atypical lymphoid cells are positive for CD20, CD79a, PAX5, BCL-2, and MUM 1.  Only scattered cells are positive for cytoplasmic kappa and not lambda.  There is patchy weak staining for BCL6.  No significant staining is seen with CD10, CD30, CD34, CD138, cyclin D1, or EBV in situ hybridization.  Ki-67 shows variable increased expression ranging from 30% to over 50% in some areas.  There is an admixed T-cell  population in the background to a lesser extent as seen with CD3 and CD5 and there is no  apparent co-expression of CD5 in B-cell areas.  The findings are most consistent with involvement by large B-cell lymphoma,  ABC type.  Given the previous history of chronic lymphocytic leukemia/small lymphocytic lymphoma, this likely represents high-grade transformation.  Clinical correlation is recommended   02/26/2021 PET scan   1. Evidence of multi organ lymphoma recurrence. 2. Enlarging foci of hypermetabolic peripheral consolidation in both lungs consistent pulmonary lymphoma. 3. Multiple foci of hypermetabolic subcutaneous nodularity consistent with subcutaneous lymphoma. 4. Solitary hypermetabolic nodal metastasis in the central mesentery and peritoneal implant along the RIGHT iliacus muscle. 5. THree foci of metabolic activity in the skeleton consistent with skeletal lymphoma.     03/08/2021 Procedure   Status post CT-guided biopsy of right lower lobe lung mass   03/12/2021 Cancer Staging   Staging form: Hodgkin and Non-Hodgkin Lymphoma, AJCC 7th Edition - Clinical stage from 03/12/2021: Stage IV - Signed by [name], MD on 03/12/2021 Staged by: Managing physician Stage prefix: Recurrence Biopsy of metastatic site performed: Yes Source of metastatic specimen: Lung    03/17/2021 Echocardiogram    1. Left ventricular ejection fraction, by estimation, is 55 to 60%. The left ventricle has normal function. The left ventricle has no regional wall motion abnormalities. There is moderate concentric left ventricular hypertrophy. Left ventricular diastolic parameters are indeterminate. The average left ventricular global longitudinal strain is -20.4 %. The global longitudinal strain is normal.  2. Right ventricular systolic function is normal. The right ventricular size is normal. Tricuspid regurgitation signal is inadequate for assessing PA pressure.  3. The mitral valve is grossly normal. Mild mitral valve regurgitation. No evidence  of mitral stenosis.  4. The aortic valve is tricuspid.  There is mild calcification of the aortic valve. Aortic valve regurgitation is not visualized. Mild aortic valve sclerosis is present, with no evidence of aortic valve stenosis.  5. The inferior vena cava is normal in size with greater than 50% respiratory variability, suggesting right atrial pressure of 3 mmHg.   03/19/2021 Procedure   Successful placement of a right internal jugular approach power injectable Port-A-Cath. The catheter is ready for immediate use.     03/29/2021 -  Chemotherapy   Patient is on Treatment Plan : NON-HODGKINS LYMPHOMA R-CHOP q21d     06/10/2021 Echocardiogram    1. Left ventricular ejection fraction, by estimation, is 55 to 60%. The left ventricle has normal function. The left ventricle has no regional wall motion abnormalities. There is mild left ventricular hypertrophy. Left ventricular diastolic parameters are indeterminate.  2. Right ventricular systolic function is normal. The right ventricular size is normal.  3. The mitral valve is normal in structure. Trivial mitral valve regurgitation.  4. The aortic valve is normal in structure. Aortic valve regurgitation is not visualized. No aortic stenosis is present.   06/14/2021 PET scan   1. Significant interval positive response to therapy. Residual hypermetabolic peripheral right lower lobe lung mass, decreased in size and metabolism. Additional previously visualized hypermetabolic sites of involvement in the skeleton, subcutaneous and muscular soft tissues and mesenteric lymph nodes have resolved. 2. New patchy ground-glass opacity with associated patchy hypermetabolism throughout both lungs, nonspecific, differential includes drug reaction or atypical infection. Consider attention on follow-up chest CT. 3. Chronic findings include: Aortic Atherosclerosis (ICD10-I70.0). Paranasal sinusitis. Right nephrolithiasis. Marked diffuse colonic diverticulosis. Coronary atherosclerosis. 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  Recommend annual imaging followup by CTA or MRA.      REVIEW OF SYSTEMS:   Unable to obtain   PHYSICAL EXAMINATION: ECOG PERFORMANCE STATUS: 4 - Bedbound  Vitals:   08/26/21 1415 08/26/21 1530  BP: 112/89   Pulse: 63   Resp: 17   Temp:    SpO2: (!) 85% (!) 78%   Filed Weights   08/19/21 1526 08/22/21 1005  Weight: 123.4 kg 121.5 kg    Intake/Output from previous day: 12/08 0701 - 12/09 0700 In: 238.6 [I.V.:138.7; IV Piggyback:99.9] Out: 50 [Urine:50]  GENERAL: Sedated on Dilaudid drip, appears comfortable Not fully examined  LABORATORY DATA:  I have reviewed the data as listed CMP Latest Ref Rng & Units 08/26/2021 08/25/2021 08/25/2021  Glucose 70 - 99 mg/dL 218(H) 424(H) 511(HH)  BUN 8 - 23 mg/dL 105(H) 91(H) 88(H)  Creatinine 0.61 - 1.24 mg/dL 3.93(H) 3.58(H) 3.38(H)  Sodium 135 - 145 mmol/L 130(L) 128(L) 129(L)  Potassium 3.5 - 5.1 mmol/L 4.2 4.2 4.6  Chloride 98 - 111 mmol/L 96(L) 93(L) 94(L)  CO2 22 - 32 mmol/L 22 21(L) 21(L)  Calcium 8.9 - 10.3 mg/dL 8.4(L) 8.2(L) 8.1(L)  Total Protein 6.5 - 8.1 g/dL - - -  Total Bilirubin 0.3 - 1.2 mg/dL - - -  Alkaline Phos 38 - 126 U/L - - -  AST 15 - 41 U/L - - -  ALT 0 - 44 U/L - - -    Lab Results  Component Value Date   WBC 6.6 08/26/2021   HGB 11.5 (L) 08/26/2021   HCT 34.9 (L) 08/26/2021   MCV 83.3 08/26/2021   PLT 94 (L) 08/26/2021   NEUTROABS 5.1 08/26/2021   I have reviewed CT imaging and x-ray myself DG Chest 1  View  Result Date: 08/23/2021 CLINICAL DATA:  Status post right thoracentesis EXAM: CHEST  1 VIEW COMPARISON:  08/22/2021 FINDINGS: Power port on the right has its tip at the SVC RA junction. There is some reduction of the amount of pleural fluid on the right. Moderate pleural effusion does persist with right lower lung atelectasis and or pneumonia. Left lower lobe atelectasis remains visible as well. No pneumothorax. IMPRESSION: No pneumothorax following thoracentesis. Some persistent pleural  fluid on the right with right lower lobe atelectasis and or pneumonia. Electronically Signed   By: Nelson Chimes M.D.   On: 08/23/2021 15:38   DG Chest 2 View  Result Date: 07/23/2021 CLINICAL DATA:  Shortness of breath over the last 3 months with productive cough and dyspnea with exertion. Non-Hodgkin's lymphoma. EXAM: CHEST - 2 VIEW COMPARISON:  CT chest 08/04/2021 and chest radiograph 08/17/2021 FINDINGS: Continued substantial consolidation in the right lower lobe, potentially from pneumonia or pulmonary lymphoma. Mild indistinct airspace opacity in the left lower lobe as well, with slight obscuration of left hemidiaphragm. Power injectable right Port-A-Cath tip: SVC. Atherosclerotic calcification of the aortic arch. Prominence of mediastinal and pericardial adipose tissues as on recent CT. Trace fluid in the right lower major fissure. A component of pleural effusion on the right is not excluded. IMPRESSION: 1. Continued consolidation in the right lower lobe. The appearance could be from pneumonia or pulmonary involvement by lymphoma. 2. Mild left lower lobe airspace opacity, nonspecific, pneumonia not excluded. 3. At least a trace right pleural effusion with fluid along the lower portion of the major fissure. 4.  Aortic Atherosclerosis (ICD10-I70.0). Electronically Signed   By: Van Clines M.D.   On: 07/22/2021 14:33   CT Chest Wo Contrast  Result Date: 08/13/2021 CLINICAL DATA:  Pneumonia.  Cough. EXAM: CT CHEST WITHOUT CONTRAST TECHNIQUE: Multidetector CT imaging of the chest was performed following the standard protocol without IV contrast. COMPARISON:  PET-CT 06/11/2021. FINDINGS: Cardiovascular: Heart and aorta are normal in size. There is no pericardial effusion. There are atherosclerotic calcifications of the aorta and coronary arteries. Right chest port catheter tip ends in the distal SVC. Superior vena cava is small in size, similar to the prior exam. Mediastinum/Nodes: The visualized  thyroid gland is within normal limits. There is an enlarged subcarinal lymph node measuring 13 mm short axis similar to the prior study. Additional nonenlarged mediastinal lymph nodes are unchanged. Difficult to assess for right hilar adenopathy secondary to lack of contrast. Esophagus is unremarkable. Lungs/Pleura: There is a new moderate-sized right pleural effusion. There is some peripheral ill-defined ground-glass and solid nodular densities throughout the right lung measuring up to 8 mm. There is compressive atelectasis and small amount of airspace disease in the right lower lobe containing air bronchograms. There is some ground-glass and interstitial opacities in the left lower lobe is well. No pneumothorax. Trachea and central airways are patent. Upper Abdomen: Gallbladder surgically absent. Limited evaluation of the upper abdomen is otherwise within normal limits. Musculoskeletal: Multilevel degenerative changes affect the spine. No acute fractures are seen. IMPRESSION: 1. New moderate-sized right pleural effusion. 2. Right lower lobe atelectasis and airspace disease worrisome for infection. 3. New diffuse right lung peripheral solid and ground-glass nodular densities, indeterminate. Findings may be infectious/inflammatory or neoplastic. 4. Ground-glass and interstitial opacities in the left lung base favors as infection/inflammation. 5. Stable enlarged subcarinal lymph node. Electronically Signed   By: Ronney Asters M.D.   On: 08/14/2021 18:43   CT CHEST WO CONTRAST  Result  Date: 08/05/2021 CLINICAL DATA:  Shortness of breath for 2 months. History of CLL with chemo. Former smoker. EXAM: CT CHEST WITHOUT CONTRAST TECHNIQUE: Multidetector CT imaging of the chest was performed following the standard protocol without IV contrast. COMPARISON:  CT studies without contrast 02/05/2021 and 08/31/2020 FINDINGS: Cardiovascular: Borderline prominent heart. No pericardial effusion. Heavy three-vessel calcific CAD.  Scattered calcifications in the aorta with ectatic ascending segment measuring 4.2 cm, unchanged. Upper limit of normal pulmonary trunk caliber. Mediastinum/Nodes: On the last CT there was a 1.5 cm soft tissue nodule anteriorly in the left axilla which is not seen today. There is a prominent posterior right hilar lymph node of 1.5 cm in short axis, previously 6 mm short axis. No other intrathoracic adenopathy is seen. Thyroid, trachea and esophagus are unremarkable without contrast. Lungs/Pleura: Mild features of subpleural fibrosis are again noted predominating in the lower zones, but present from the apices inferiorly. This has shown no progression. There is early subpleural microcystic honeycombing in both lateral bases. There is increased subpleural micronodular interstitial change in the anterolateral aspect left upper lobe, level of the aortic arch concerning for pneumonitis/bronchiolitis. There is interval enlargement of a large pleural-based opacity with air bronchograms in the posterior basilar right lower lobe which is now 10.6 x 7.4 cm, previously 7.1 x 4.4 cm. The previously noted pleural-based mass in the lateral base of the lingula has improved however , now measuring 2.1 x 1.5 cm, previously 3.3 x 1.8 cm. There is new hazy interstitial change in the anterior basal left lower lobe concerning for pneumonitis as well as peribronchovascular ground-glass and micronodular disease in the lateral aspect left lower lobe on series 8 axial images 87-90. Rest of the lungs are generally clear. There is no pleural effusion or pneumothorax. Musculoskeletal: There is osteopenia and degenerative changes of the spine. No suspicious chest wall lesions or destructive bone lesions. Right chest port with IJ approach catheter again noted with the tip in the SVC. Upper abdomen: 8 mm calculus again noted in the dependent right renal pelvis, with atrophic kidneys and moderate perinephric stranding as well as small  nonobstructive caliceal stones in the right kidney. Soft tissue thickening posterior to left kidney now measures 3.7 x 1.4 cm with increased fatty lucency, previously 4.7 x 2.0 cm. IMPRESSION: 1. Enlarging area of consolidation with air bronchograms in the posterior basal right lower lobe but decreased size of the pre-existing lingular nodule and of a posterior left perinephric soft tissue mass. 2. Increased prominence of a single posterior right hilar lymph node of 1.6 cm short axis previously 0.6 cm without further adenopathy visible without contrast. 3. Areas of increased opacity in the left upper and lower lobes concerning for interval pneumonitis. Follow-up as indicated. 4. Basal-predominant subpleural fibrosis. 5. Coronary artery and aortic atherosclerosis. Borderline cardiomegaly. Electronically Signed   By: Telford Nab M.D.   On: 08/05/2021 02:15   [place] CHEST (PLEURAL EFFUSION)  Result Date: 08/19/2021 CLINICAL DATA:  Evaluate for thoracentesis. EXAM: CHEST ULTRASOUND COMPARISON:  Chest CT 08/07/2021 FINDINGS: Small amount of right pleural fluid but there is not a good percutaneous window. IMPRESSION: Small right pleural effusion. Thoracentesis not performed due to small volume and lack of safe percutaneous window. Electronically Signed   By: Markus Daft M.D.   On: 08/19/2021 13:40   CT Chest High Resolution  Result Date: 08/23/2021 CLINICAL DATA:  81 year old male with history of shortness of breath. Evaluate for interstitial lung disease. EXAM: CT CHEST WITHOUT CONTRAST TECHNIQUE: Multidetector CT  imaging of the chest was performed following the standard protocol without intravenous contrast. High resolution imaging of the lungs, as well as inspiratory and expiratory imaging, was performed. COMPARISON:  No priors. FINDINGS: Cardiovascular: Heart size is normal. There is no significant pericardial fluid, thickening or pericardial calcification. There is aortic atherosclerosis, as well as  atherosclerosis of the great vessels of the mediastinum and the coronary arteries, including calcified atherosclerotic plaque in the left main, left anterior descending, left circumflex and right coronary arteries. Mild calcifications of the aortic valve. Right internal jugular single-lumen porta cath with tip terminating in the distal superior vena cava. Mediastinum/Nodes: No pathologically enlarged mediastinal or hilar lymph nodes. Please note that accurate exclusion of hilar adenopathy is limited on noncontrast CT scans. Esophagus is unremarkable in appearance. No axillary lymphadenopathy. Lungs/Pleura: High-resolution imaging is limited by presence of a moderate-sized right-sided pleural effusion which is associated with near complete atelectasis and consolidation of the right lower lobe. With these limitations in mind, in the well aerated portions of the lungs there are patchy areas of ground-glass attenuation, septal thickening, small amount of subpleural reticulation and thickening of the peribronchovascular interstitium with some regional architectural distortion. No definite traction bronchiectasis or frank honeycombing confidently identified. Inspiratory and expiratory imaging is remarkable for very mild air trapping indicative of small airways disease. Upper Abdomen: Esophagus is unremarkable in appearance. No axillary lymphadenopathy. Musculoskeletal: There are no aggressive appearing lytic or blastic lesions noted in the visualized portions of the skeleton. IMPRESSION: 1. There is evidence of interstitial lung disease, with a spectrum of findings considered indeterminate for usual interstitial pneumonia (UIP) per current ATS guidelines. Follow-up high-resolution chest CT is recommended in 12 months to assess for temporal changes in the appearance of the lung parenchyma. 2. Moderate right pleural effusion with a combination of airspace consolidation and atelectasis in the right lower lobe, concerning for  right lower lobe pneumonia. 3. Aortic atherosclerosis, in addition to left main and 3 vessel coronary artery disease. Assessment for potential risk factor modification, dietary therapy or pharmacologic therapy may be warranted, if clinically indicated. 4. There are calcifications of the aortic valve. Echocardiographic correlation for evaluation of potential valvular dysfunction may be warranted if clinically indicated. Aortic Atherosclerosis (ICD10-I70.0). Electronically Signed   By: Vinnie Langton M.D.   On: 08/23/2021 08:18   DG CHEST PORT 1 VIEW  Result Date: 08/26/2021 CLINICAL DATA:  Increasing shortness of breath EXAM: PORTABLE CHEST 1 VIEW COMPARISON:  08/23/2021 FINDINGS: Cardiac shadow is stable. Right-sided chest wall port is noted. Increasing density in the right hemithorax is noted consistent with recurrent pleural effusion. Left lung remains clear. No bony abnormality is noted. IMPRESSION: Increasing density in the right hemithorax consistent with recurrent pleural effusion. Electronically Signed   By: Inez Catalina M.D.   On: 08/26/2021 00:46   DG CHEST PORT 1 VIEW  Result Date: 08/22/2021 CLINICAL DATA:  81 year old male with increase shortness of breath. 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 Small pleural effusion not feasible for thoracentesis on 08/19/2021. EXAM: PORTABLE CHEST 1 VIEW COMPARISON:  Chest CT 08/08/2021 and earlier. FINDINGS: Portable AP semi upright view at 0843 hours. Stable right chest Port-A-Cath, power port which is currently accessed. Mildly lower lung volumes. Progressive failing and confluent opacity in the right lower lung and about the hilum. Stable visible mediastinal contours. Left lung appears stable. No pneumothorax or pulmonary edema. IMPRESSION: Progressive right airspace disease and/or pleural effusion since the CT on 07/30/2021. Repeat Chest Ultrasound for feasibility of thoracentesis now might  be valuable. Lower lung volumes, left lung remains clear.  Electronically Signed   By: Genevie Ann M.D.   On: 08/22/2021 08:57   VAS [place] LOWER EXTREMITY VENOUS (DVT) (7a-7p)  Result Date: 08/19/2021  Lower Venous DVT Study Patient Name:  Derek [name]  Date of Exam:   07/25/2021 Medical Rec #: [id number]    Accession #:    [id number] Date of Birth: Jan 26, 1941    Patient Gender: M Patient Age:   27 years Exam Location:  [facility] Procedure:      VAS [place] LOWER EXTREMITY VENOUS (DVT) Referring Phys: Fredia Sorrow --------------------------------------------------------------------------------  Indications: Edema, and Palpable Cord.  Comparison Study: No prior study Performing Technologist: Maudry Mayhew MHA, RDMS, RVT, RDCS  Examination Guidelines: A complete evaluation includes B-mode imaging, spectral Doppler, color Doppler, and power Doppler as needed of all accessible portions of each vessel. Bilateral testing is considered an integral part of a complete examination. Limited examinations for reoccurring indications may be performed as noted. The reflux portion of the exam is performed with the patient in reverse Trendelenburg.  +-----+---------------+---------+-----------+----------+--------------+ RIGHTCompressibilityPhasicitySpontaneityPropertiesThrombus Aging +-----+---------------+---------+-----------+----------+--------------+ CFV  Full           Yes      Yes                                 +-----+---------------+---------+-----------+----------+--------------+   +---------+---------------+---------+-----------+----------+--------------+ LEFT     CompressibilityPhasicitySpontaneityPropertiesThrombus Aging +---------+---------------+---------+-----------+----------+--------------+ CFV      Full           Yes      Yes                                 +---------+---------------+---------+-----------+----------+--------------+ SFJ      Full                                                         +---------+---------------+---------+-----------+----------+--------------+ FV Prox  Full                                                        +---------+---------------+---------+-----------+----------+--------------+ FV Mid   Full                                                        +---------+---------------+---------+-----------+----------+--------------+ FV DistalFull                                                        +---------+---------------+---------+-----------+----------+--------------+ PFV      Full                                                        +---------+---------------+---------+-----------+----------+--------------+  POP      Full           Yes      Yes                                 +---------+---------------+---------+-----------+----------+--------------+ PTV      Full                                                        +---------+---------------+---------+-----------+----------+--------------+ PERO     Full                                                        +---------+---------------+---------+-----------+----------+--------------+ Gastroc  Full                                                        +---------+---------------+---------+-----------+----------+--------------+     Summary: RIGHT: - No evidence of common femoral vein obstruction.  LEFT: - There is no evidence of deep vein thrombosis in the lower extremity.  - No cystic structure found in the popliteal fossa.  *See table(s) above for measurements and observations. Electronically signed by Monica Martinez MD on 08/19/2021 at 5:23:22 PM.    Final    ECHOCARDIOGRAM LIMITED  Result Date: 08/22/2021    ECHOCARDIOGRAM LIMITED REPORT   Patient Name:   Derek [name] Date of Exam: 08/22/2021 Medical Rec #:  [id number]   Height:       76.0 in Accession #:    [id number]  Weight:       267.9 lb Date of Birth:  06/19/1941   BSA:          2.508 m Patient Age:    64  years    BP:           115/71 mmHg Patient Gender: M           HR:           80 bpm. Exam Location:  Inpatient Procedure: 2D Echo, Limited Echo and Intracardiac Opacification Agent Indications:    CHF  History:        Patient has prior history of Echocardiogram examinations. CAD,                 COPD; Risk Factors:Hypertension and Diabetes.  Sonographer:    Jyl Heinz Referring Phys: [id number] PAULA V ROSS IMPRESSIONS  1. Windows still challenging. With contrast, LV function is mildly reduced. Hypokinesis of the inferior, inferoseptal wall ; as well as anteroseptum as well. RV function is normal. IVC is normal and collapses RA pressure 3. FINDINGS  Left Ventricle: Windows still challenging. With contrast, LV function is mildly reduced. Hypokinesis of the inferior, inferoseptal wall ; as well as anteroseptum as well. RV function is normal. IVC is normal and collapses RA pressure 3.  LV Volumes (MOD) LV vol d, MOD A2C: 125.0 ml LV vol d, MOD A4C: 127.0 ml LV vol s, MOD  A2C: 53.0 ml LV vol s, MOD A4C: 53.3 ml LV SV MOD A2C:     72.0 ml LV SV MOD A4C:     127.0 ml LV SV MOD BP:      75.6 ml IVC IVC diam: 1.30 cm Phineas Inches Electronically signed by Phineas Inches Signature Date/Time: 08/22/2021/1:49:49 PM    Final    ECHOCARDIOGRAM LIMITED  Result Date: 08/21/2021    ECHOCARDIOGRAM LIMITED REPORT   Patient Name:   Derek [name] Date of Exam: 08/21/2021 Medical Rec #:  [id number]   Height:       76.0 in Accession #:    [id number]  Weight:       272.0 lb Date of Birth:  Jan 01, 1941   BSA:          2.524 m Patient Age:    37 years    BP:           127/64 mmHg Patient Gender: M           HR:           83 bpm. Exam Location:  Inpatient Procedure: Limited Echo, Limited Color Doppler and Cardiac Doppler Indications:    CHF-Acute Diastolic G28.36  History:        Patient has prior history of Echocardiogram examinations, most                 recent 06/07/2021. Cardiomyopathy; Risk Factors:Hypertension and                 Diabetes.   Sonographer:    Bernadene Person RDCS Referring Phys: [id number] AMRIT ADHIKARI IMPRESSIONS  1. Poor acoustic windows limt study LVEF may be mildly depressed REcomm limited echo with Defiinity to confirm wall motion Images suggestive that inferior/inferoseptal walls are hypokinetic . There is moderate left ventricular hypertrophy.  2. Right ventricular systolic function is low normal. The right ventricular size is mildly enlarged. There is normal pulmonary artery systolic pressure.  3. MR is eccentric, directed posterior into LA. Probable moderate . Moderate mitral valve regurgitation.  4. The aortic valve is tricuspid. Aortic valve regurgitation is not visualized. Aortic valve sclerosis/calcification is present, without any evidence of aortic stenosis.  5. Aortic dilatation noted. There is mild dilatation of the aortic root, measuring 41 mm. There is mild dilatation of the ascending aorta, measuring 38 mm.  6. The inferior vena cava is normal in size with greater than 50% respiratory variability, suggesting right atrial pressure of 3 mmHg. FINDINGS  Left Ventricle: Poor acoustic windows limt study LVEF may be mildly depressed REcomm limited echo with Defiinity to confirm wall motion Images suggestive that inferior/inferoseptal walls are hypokinetic. The left ventricular internal cavity size was normal in size. There is moderate left ventricular hypertrophy. Right Ventricle: The right ventricular size is mildly enlarged. Right ventricular systolic function is low normal. There is normal pulmonary artery systolic pressure. The tricuspid regurgitant velocity is 2.38 m/s, and with an assumed right atrial pressure of 3 mmHg, the estimated right ventricular systolic pressure is 46.5 mmHg. Left Atrium: Left atrial size was normal in size. Right Atrium: Right atrial size was normal in size. Pericardium: There is no evidence of pericardial effusion. Mitral Valve: MR is eccentric, directed posterior into LA. Probable moderate.  There is mild thickening of the mitral valve leaflet(s). Moderate mitral valve regurgitation. Tricuspid Valve: The tricuspid valve is grossly normal. Tricuspid valve regurgitation is mild. Aortic Valve: The aortic valve is tricuspid. Aortic valve regurgitation is not visualized.  Aortic valve sclerosis/calcification is present, without any evidence of aortic stenosis. Pulmonic Valve: The pulmonic valve was not well visualized. Pulmonic valve regurgitation is not visualized. Aorta: Aortic dilatation noted. There is mild dilatation of the aortic root, measuring 41 mm. There is mild dilatation of the ascending aorta, measuring 38 mm. Venous: The inferior vena cava is normal in size with greater than 50% respiratory variability, suggesting right atrial pressure of 3 mmHg. IAS/Shunts: The interatrial septum was not assessed. LEFT VENTRICLE PLAX 2D LVIDd:         4.50 cm     Diastology LVIDs:         2.60 cm     LV e' medial:    3.38 cm/s LV PW:         1.50 cm     LV E/e' medial:  19.7 LV IVS:        1.30 cm     LV e' lateral:   6.08 cm/s LVOT diam:     2.10 cm     LV E/e' lateral: 10.9 LV SV:         62 LV SV Index:   25 LVOT Area:     3.46 cm  LV Volumes (MOD) LV vol d, MOD A2C: 96.0 ml LV vol d, MOD A4C: 81.0 ml LV vol s, MOD A2C: 51.6 ml LV vol s, MOD A4C: 41.9 ml LV SV MOD A2C:     44.4 ml LV SV MOD A4C:     81.0 ml LV SV MOD BP:      41.0 ml RIGHT VENTRICLE RV S prime:     13.50 cm/s TAPSE (M-mode): 2.5 cm LEFT ATRIUM           Index LA diam:      3.90 cm 1.54 cm/m LA Vol (A4C): 46.2 ml 18.30 ml/m  AORTIC VALVE LVOT Vmax:   103.00 cm/s LVOT Vmean:  69.400 cm/s LVOT VTI:    0.180 m  AORTA Ao Root diam: 4.10 cm Ao Asc diam:  3.80 cm MITRAL VALVE                  TRICUSPID VALVE MV Area (PHT): 3.46 cm       TR Peak grad:   22.7 mmHg MV Decel Time: 219 msec       TR Vmax:        238.00 cm/s MR Peak grad:    118.8 mmHg MR Mean grad:    81.0 mmHg    SHUNTS MR Vmax:         545.00 cm/s  Systemic VTI:  0.18 m MR Vmean:         423.0 cm/s   Systemic Diam: 2.10 cm MR PISA:         1.57 cm MR PISA Eff ROA: 11 mm MR PISA Radius:  0.50 cm MV E velocity: 66.50 cm/s MV A velocity: 48.00 cm/s MV E/A ratio:  1.39 Dorris Carnes MD Electronically signed by Dorris Carnes MD Signature Date/Time: 08/21/2021/4:03:52 PM    Final    US THORACENTESIS ASP PLEURAL SPACE W/IMG GUIDE  Result Date: 08/23/2021 INDICATION: Patient with a history of large B-cell lymphoma and hospital admission for respiratory failure secondary to COVID pneumonia. Patient found to have right pleural effusion. Interventional radiology asked to perform a diagnostic and therapeutic thoracentesis. EXAM: ULTRASOUND GUIDED THORACENTESIS MEDICATIONS: 1% lidocaine 10 mL COMPLICATIONS: None immediate. PROCEDURE: An ultrasound guided thoracentesis was thoroughly discussed with the patient and questions answered.  The benefits, risks, alternatives and complications were also discussed. The patient understands and wishes to proceed with the procedure. Written consent was obtained. Ultrasound was performed to localize and mark an adequate pocket of fluid in the right chest. The area was then prepped and draped in the normal sterile fashion. 1% Lidocaine was used for local anesthesia. Under ultrasound guidance a 6 Fr Safe-T-Centesis catheter was introduced. Thoracentesis was performed. The catheter was removed and a dressing applied. FINDINGS: A total of approximately 500 mL of light brown fluid was removed. Samples were sent to the laboratory as requested by the clinical team. IMPRESSION: Successful ultrasound guided right thoracentesis yielding 500 mL of pleural fluid. Read by: Soyla Dryer, NP Electronically Signed   By: Markus Daft M.D.   On: 08/23/2021 15:45

## 2021-09-19 NOTE — Progress Notes (Signed)
PMT brief progress note.  Patient seen earlier this morning, not awake, not alert.  Actively dying, no distress.  Chart reviewed, medication history noted, discussed with PMT colleague Ms Elicia Lamp NP.  Anticipate hospital death.  Patient noted to have been pronounced dead on 09-23-21 at 16. 15 minutes spent Greater than 50%  of this time was spent counseling and coordinating care related to the above assessment and plan. Loistine Chance MD [place] palliative.

## 2021-09-19 NOTE — Progress Notes (Signed)
   2021-08-28 1100  Clinical Encounter Type  Visited With Patient and family together  Visit Type Initial;Spiritual support;Psychological support;Social support;Death  Referral From Nurse  Consult/Referral To Chaplain  Spiritual Encounters  Spiritual Needs Sacred text;Brochure;Ritual;Emotional;Grief support  Stress Factors  Patient Stress Factors None identified  Family Stress Factors Exhausted;Loss;Major life changes    Chaplain was present for patient and family as patient died.  Four family members present and Chaplain provided spiritual comfort and care to each member.  Provided anointing of oil and commendation of Derek [name] into spiritual care of their God.  At family's request - prayed the Lords Prayer as well.  Family gathered at bedside and spoke words of love and admiration or the life of Derek [name].    Respectfully Submitted,   Rev. Mammie Lorenzo, M. Divinity [facility], [facility], Palliative Care/Hospice

## 2021-09-19 NOTE — Progress Notes (Signed)
RN noted slowed HR on monitor at approx 1040 and immediately when in room. Family at bedside. Pt passed away at 1044. Josph Macho, RN assisted with confirmation. MD made aware.

## 2021-09-19 NOTE — Death Summary Note (Signed)
DEATH SUMMARY   Patient Details  Name: Derek [name] MRN: [id number] DOB: Apr 06, 1941  Admission/Discharge Information   Admit Date:  Aug 26, 2021  Date of Death: Date of Death: 2021-09-04  Time of Death: Time of Death: 12-11-1042  Length of Stay: 12-04-22  Referring Physician: Wenda Low, MD   Reason(s) for Hospitalization  Progressive dyspnea  Diagnoses  Preliminary cause of death: Acute hypoxic respiratory failure, multifactorial  Secondary Diagnoses (including complications and co-morbidities):  Principal Problem:   Acute respiratory failure with hypoxia ([place]) Active Problems:   Diabetes mellitus, type II ([place])   Diffuse large B cell lymphoma ([place])   Thrombocytopenia ([place])   Chronic kidney disease (CKD), 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HCC)   PAF (paroxysmal atrial fibrillation) (HCC)   COPD (chronic obstructive pulmonary disease) ([place])   Community acquired pneumonia   Pleural effusion   Leukopenia   Neutropenia (HCC)   Hypothyroidism   Acute respiratory acidosis (HCC)   Malnutrition of moderate degree   Pressure injury of skin   Acquired hypogammaglobulinemia [facility])   [place] Hospital Course (including significant findings, care, treatment, and services provided and events leading to death)  Derek [name] is a 81 y.o. year old male with history of COPD, large B-cell lymphoma on chemotherapy, A. fib/flutter on Xarelto, CKD stage IV, hypertension, coronary artery disease, type 2 diabetes, hypothyroidism, hyperlipidemia who presented from home with increased shortness of breath for the last 3 months. Patient reported fever of 103 Fahrenheit at home.  He was diagnosed with COVID back in February but continues to be positive.  He was off chemotherapy for the last 3 months due to persistent respiratory issues, history of recent weight loss.  On presentation he was afebrile, normotensive but hypoxic on room air with 85% saturation.  He was placed on 4 L of nasal cannula.  Lab work showed pancytopenia. CT  chest showed new moderate right pleural effusion, diffuse right peripheral solid and groundglass nodular densities representing infectious versus neoplastic process.  Started on azithromycin and ceftriaxone for pneumonia.  Also started on Lasix IV for severe volume overload. Hospital course remarkable for worsening respiratory status, requiring 15 L of oxygen in the AM of 08/22/21.  Patient  transferred to stepdown unit.  PCCM and cardiology consulted. Oncology also following.  Palliative care also consulted for goals of care, pt chose to be DNR. Due to worsening respiratory status, overall declining of medical condition, and poor prognosis, pt was made comfort care on 09/03/21. Pt passed away on 09-04-2021.   Hospital course as follows: Acute hypoxic respiratory failure Multifactorial  Hypoxic on presentation, not on home O2, required about 15L of O2 Thought to be secondary to possible right-sided pneumonia/right-sided pleural effusion vs radiation/covid pneumonitis versus progression of lymphoma.  Also has history of COPD CT chest also showed diffuse right peripheral solid and groundglass nodular densities representing infectious versus neoplastic process High-resolution CT scan showed findings of interstitial lung disease.  Also showed moderate right pleural effusion with atelectasis of the lower lobe. Completed ceftriaxone, azithromycin Due to declining respiratory status, patient was transitioned to comfort care   Lymphoma/hypogammaglobinemia His pulmonary findings could be secondary to progression of lymphoma.  Received IVIG   Congestive heart failure/volume overload Had severe bilateral lower extremity edema. Elevated  BNP, started on  Lasix 80 mg IV twice a day.  Echo done here showed inferior/inferoseptal wall hypokinesis, moderate left ventricular hypertrophy, low normal right ventricular systolic function, moderate mitral valve regurgitation Cardiology consulted, lasix discontinued due to  worsening renal  status   AKI on CKD stage IV Creatinine progressively worsened  Baseline creatinine around 2.2   Diabetes type 2 with hyperglycemia  Worsened by steroids Received insulin regimen which was adjusted prn   Hyponatremia Multifactorial   Pancytopenia Secondary to history of large B-cell lymphoma/chemotherapy   History of paroxysmal A. fib/a flutter Was on Xarelto, metoprolol and amiodarone, d/ced   History of coronary artery disease   Hypothyroidism   Debility/deconditioning/weakness   Obesity: BMI 32.6     Pertinent Labs and Studies  Significant Diagnostic Studies DG Chest 1 View  Result Date: 08/23/2021 CLINICAL DATA:  Status post right thoracentesis EXAM: CHEST  1 VIEW COMPARISON:  08/22/2021 FINDINGS: Power port on the right has its tip at the SVC RA junction. There is some reduction of the amount of pleural fluid on the right. Moderate pleural effusion does persist with right lower lung atelectasis and or pneumonia. Left lower lobe atelectasis remains visible as well. No pneumothorax. IMPRESSION: No pneumothorax following thoracentesis. Some persistent pleural fluid on the right with right lower lobe atelectasis and or pneumonia. Electronically Signed   By: Nelson Chimes M.D.   On: 08/23/2021 15:38   DG Chest 2 View  Result Date: 07/29/2021 CLINICAL DATA:  Shortness of breath over the last 3 months with productive cough and dyspnea with exertion. Non-Hodgkin's lymphoma. EXAM: CHEST - 2 VIEW COMPARISON:  CT chest 08/04/2021 and chest radiograph 08/17/2021 FINDINGS: Continued substantial consolidation in the right lower lobe, potentially from pneumonia or pulmonary lymphoma. Mild indistinct airspace opacity in the left lower lobe as well, with slight obscuration of left hemidiaphragm. Power injectable right Port-A-Cath tip: SVC. Atherosclerotic calcification of the aortic arch. Prominence of mediastinal and pericardial adipose tissues as on recent CT. Trace fluid  in the right lower major fissure. A component of pleural effusion on the right is not excluded. IMPRESSION: 1. Continued consolidation in the right lower lobe. The appearance could be from pneumonia or pulmonary involvement by lymphoma. 2. Mild left lower lobe airspace opacity, nonspecific, pneumonia not excluded. 3. At least a trace right pleural effusion with fluid along the lower portion of the major fissure. 4.  Aortic Atherosclerosis (ICD10-I70.0). Electronically Signed   By: Van Clines M.D.   On: 08/13/2021 14:33   CT Chest Wo Contrast  Result Date: 07/30/2021 CLINICAL DATA:  Pneumonia.  Cough. EXAM: CT CHEST WITHOUT CONTRAST TECHNIQUE: Multidetector CT imaging of the chest was performed following the standard protocol without IV contrast. COMPARISON:  PET-CT 06/11/2021. FINDINGS: Cardiovascular: Heart and aorta are normal in size. There is no pericardial effusion. There are atherosclerotic calcifications of the aorta and coronary arteries. Right chest port catheter tip ends in the distal SVC. Superior vena cava is small in size, similar to the prior exam. Mediastinum/Nodes: The visualized thyroid gland is within normal limits. There is an enlarged subcarinal lymph node measuring 13 mm short axis similar to the prior study. Additional nonenlarged mediastinal lymph nodes are unchanged. Difficult to assess for right hilar adenopathy secondary to lack of contrast. Esophagus is unremarkable. Lungs/Pleura: There is a new moderate-sized right pleural effusion. There is some peripheral ill-defined ground-glass and solid nodular densities throughout the right lung measuring up to 8 mm. There is compressive atelectasis and small amount of airspace disease in the right lower lobe containing air bronchograms. There is some ground-glass and interstitial opacities in the left lower lobe is well. No pneumothorax. Trachea and central airways are patent. Upper Abdomen: Gallbladder surgically absent. Limited  evaluation of the  upper abdomen is otherwise within normal limits. Musculoskeletal: Multilevel degenerative changes affect the spine. No acute fractures are seen. IMPRESSION: 1. New moderate-sized right pleural effusion. 2. Right lower lobe atelectasis and airspace disease worrisome for infection. 3. New diffuse right lung peripheral solid and ground-glass nodular densities, indeterminate. Findings may be infectious/inflammatory or neoplastic. 4. Ground-glass and interstitial opacities in the left lung base favors as infection/inflammation. 5. Stable enlarged subcarinal lymph node. Electronically Signed   By: Ronney Asters M.D.   On: 08/17/2021 18:43   CT CHEST WO CONTRAST  Result Date: 08/05/2021 CLINICAL DATA:  Shortness of breath for 2 months. History of CLL with chemo. Former smoker. EXAM: CT CHEST WITHOUT CONTRAST TECHNIQUE: Multidetector CT imaging of the chest was performed following the standard protocol without IV contrast. COMPARISON:  CT studies without contrast 02/05/2021 and 08/31/2020 FINDINGS: Cardiovascular: Borderline prominent heart. No pericardial effusion. Heavy three-vessel calcific CAD. Scattered calcifications in the aorta with ectatic ascending segment measuring 4.2 cm, unchanged. Upper limit of normal pulmonary trunk caliber. Mediastinum/Nodes: On the last CT there was a 1.5 cm soft tissue nodule anteriorly in the left axilla which is not seen today. There is a prominent posterior right hilar lymph node of 1.5 cm in short axis, previously 6 mm short axis. No other intrathoracic adenopathy is seen. Thyroid, trachea and esophagus are unremarkable without contrast. Lungs/Pleura: Mild features of subpleural fibrosis are again noted predominating in the lower zones, but present from the apices inferiorly. This has shown no progression. There is early subpleural microcystic honeycombing in both lateral bases. There is increased subpleural micronodular interstitial change in the anterolateral  aspect left upper lobe, level of the aortic arch concerning for pneumonitis/bronchiolitis. There is interval enlargement of a large pleural-based opacity with air bronchograms in the posterior basilar right lower lobe which is now 10.6 x 7.4 cm, previously 7.1 x 4.4 cm. The previously noted pleural-based mass in the lateral base of the lingula has improved however , now measuring 2.1 x 1.5 cm, previously 3.3 x 1.8 cm. There is new hazy interstitial change in the anterior basal left lower lobe concerning for pneumonitis as well as peribronchovascular ground-glass and micronodular disease in the lateral aspect left lower lobe on series 8 axial images 87-90. Rest of the lungs are generally clear. There is no pleural effusion or pneumothorax. Musculoskeletal: There is osteopenia and degenerative changes of the spine. No suspicious chest wall lesions or destructive bone lesions. Right chest port with IJ approach catheter again noted with the tip in the SVC. Upper abdomen: 8 mm calculus again noted in the dependent right renal pelvis, with atrophic kidneys and moderate perinephric stranding as well as small nonobstructive caliceal stones in the right kidney. Soft tissue thickening posterior to left kidney now measures 3.7 x 1.4 cm with increased fatty lucency, previously 4.7 x 2.0 cm. IMPRESSION: 1. Enlarging area of consolidation with air bronchograms in the posterior basal right lower lobe but decreased size of the pre-existing lingular nodule and of a posterior left perinephric soft tissue mass. 2. Increased prominence of a single posterior right hilar lymph node of 1.6 cm short axis previously 0.6 cm without further adenopathy visible without contrast. 3. Areas of increased opacity in the left upper and lower lobes concerning for interval pneumonitis. Follow-up as indicated. 4. Basal-predominant subpleural fibrosis. 5. Coronary artery and aortic atherosclerosis. Borderline cardiomegaly. Electronically Signed   By:  Telford Nab M.D.   On: 08/05/2021 02:15   [place] CHEST (PLEURAL EFFUSION)  Result Date:  08/19/2021 CLINICAL DATA:  Evaluate for thoracentesis. EXAM: CHEST ULTRASOUND COMPARISON:  Chest CT 07/24/2021 FINDINGS: Small amount of right pleural fluid but there is not a good percutaneous window. IMPRESSION: Small right pleural effusion. Thoracentesis not performed due to small volume and lack of safe percutaneous window. Electronically Signed   By: Markus Daft M.D.   On: 08/19/2021 13:40   CT Chest High Resolution  Result Date: 08/23/2021 CLINICAL DATA:  81 year old male with history of shortness of breath. Evaluate for interstitial lung disease. EXAM: CT CHEST WITHOUT CONTRAST TECHNIQUE: Multidetector CT imaging of the chest was performed following the standard protocol without intravenous contrast. High resolution imaging of the lungs, as well as inspiratory and expiratory imaging, was performed. COMPARISON:  No priors. FINDINGS: Cardiovascular: Heart size is normal. There is no significant pericardial fluid, thickening or pericardial calcification. There is aortic atherosclerosis, as well as atherosclerosis of the great vessels of the mediastinum and the coronary arteries, including calcified atherosclerotic plaque in the left main, left anterior descending, left circumflex and right coronary arteries. Mild calcifications of the aortic valve. Right internal jugular single-lumen porta cath with tip terminating in the distal superior vena cava. Mediastinum/Nodes: No pathologically enlarged mediastinal or hilar lymph nodes. Please note that accurate exclusion of hilar adenopathy is limited on noncontrast CT scans. Esophagus is unremarkable in appearance. No axillary lymphadenopathy. Lungs/Pleura: High-resolution imaging is limited by presence of a moderate-sized right-sided pleural effusion which is associated with near complete atelectasis and consolidation of the right lower lobe. With these limitations in mind, in  the well aerated portions of the lungs there are patchy areas of ground-glass attenuation, septal thickening, small amount of subpleural reticulation and thickening of the peribronchovascular interstitium with some regional architectural distortion. No definite traction bronchiectasis or frank honeycombing confidently identified. Inspiratory and expiratory imaging is remarkable for very mild air trapping indicative of small airways disease. Upper Abdomen: Esophagus is unremarkable in appearance. No axillary lymphadenopathy. Musculoskeletal: There are no aggressive appearing lytic or blastic lesions noted in the visualized portions of the skeleton. IMPRESSION: 1. There is evidence of interstitial lung disease, with a spectrum of findings considered indeterminate for usual interstitial pneumonia (UIP) per current ATS guidelines. Follow-up high-resolution chest CT is recommended in 12 months to assess for temporal changes in the appearance of the lung parenchyma. 2. Moderate right pleural effusion with a combination of airspace consolidation and atelectasis in the right lower lobe, concerning for right lower lobe pneumonia. 3. Aortic atherosclerosis, in addition to left main and 3 vessel coronary artery disease. Assessment for potential risk factor modification, dietary therapy or pharmacologic therapy may be warranted, if clinically indicated. 4. There are calcifications of the aortic valve. Echocardiographic correlation for evaluation of potential valvular dysfunction may be warranted if clinically indicated. Aortic Atherosclerosis (ICD10-I70.0). Electronically Signed   By: Vinnie Langton M.D.   On: 08/23/2021 08:18   DG CHEST PORT 1 VIEW  Result Date: 08/26/2021 CLINICAL DATA:  Increasing shortness of breath EXAM: PORTABLE CHEST 1 VIEW COMPARISON:  08/23/2021 FINDINGS: Cardiac shadow is stable. Right-sided chest wall port is noted. Increasing density in the right hemithorax is noted consistent with recurrent  pleural effusion. Left lung remains clear. No bony abnormality is noted. IMPRESSION: Increasing density in the right hemithorax consistent with recurrent pleural effusion. Electronically Signed   By: Inez Catalina M.D.   On: 08/26/2021 00:46   DG CHEST PORT 1 VIEW  Result Date: 08/22/2021 CLINICAL DATA:  81 year old male with increase shortness of breath. Large B-cell  lymphoma on chemotherapy. Small pleural effusion not feasible for thoracentesis on 08/19/2021. EXAM: PORTABLE CHEST 1 VIEW COMPARISON:  Chest CT 08/09/2021 and earlier. FINDINGS: Portable AP semi upright view at 0843 hours. Stable right chest Port-A-Cath, power port which is currently accessed. Mildly lower lung volumes. Progressive failing and confluent opacity in the right lower lung and about the hilum. Stable visible mediastinal contours. Left lung appears stable. No pneumothorax or pulmonary edema. IMPRESSION: Progressive right airspace disease and/or pleural effusion since the CT on 08/09/2021. Repeat Chest Ultrasound for feasibility of thoracentesis now might be valuable. Lower lung volumes, left lung remains clear. Electronically Signed   By: Genevie Ann M.D.   On: 08/22/2021 08:57   VAS [place] LOWER EXTREMITY VENOUS (DVT) (7a-7p)  Result Date: 08/19/2021  Lower Venous DVT Study Patient Name:  Derek [name]  Date of Exam:   07/22/2021 Medical Rec #: [id number]    Accession #:    [id number] Date of Birth: Oct 21, 1940    Patient Gender: M Patient Age:   72 years Exam Location:  [facility] Procedure:      VAS [place] LOWER EXTREMITY VENOUS (DVT) Referring Phys: Fredia Sorrow --------------------------------------------------------------------------------  Indications: Edema, and Palpable Cord.  Comparison Study: No prior study Performing Technologist: Maudry Mayhew MHA, RDMS, RVT, RDCS  Examination Guidelines: A complete evaluation includes B-mode imaging, spectral Doppler, color Doppler, and power Doppler as needed of all accessible  portions of each vessel. Bilateral testing is considered an integral part of a complete examination. Limited examinations for reoccurring indications may be performed as noted. The reflux portion of the exam is performed with the patient in reverse Trendelenburg.  +-----+---------------+---------+-----------+----------+--------------+ RIGHTCompressibilityPhasicitySpontaneityPropertiesThrombus Aging +-----+---------------+---------+-----------+----------+--------------+ CFV  Full           Yes      Yes                                 +-----+---------------+---------+-----------+----------+--------------+   +---------+---------------+---------+-----------+----------+--------------+ LEFT     CompressibilityPhasicitySpontaneityPropertiesThrombus Aging +---------+---------------+---------+-----------+----------+--------------+ CFV      Full           Yes      Yes                                 +---------+---------------+---------+-----------+----------+--------------+ SFJ      Full                                                        +---------+---------------+---------+-----------+----------+--------------+ FV Prox  Full                                                        +---------+---------------+---------+-----------+----------+--------------+ FV Mid   Full                                                        +---------+---------------+---------+-----------+----------+--------------+ FV DistalFull                                                        +---------+---------------+---------+-----------+----------+--------------+  PFV      Full                                                        +---------+---------------+---------+-----------+----------+--------------+ POP      Full           Yes      Yes                                 +---------+---------------+---------+-----------+----------+--------------+ PTV      Full                                                         +---------+---------------+---------+-----------+----------+--------------+ PERO     Full                                                        +---------+---------------+---------+-----------+----------+--------------+ Gastroc  Full                                                        +---------+---------------+---------+-----------+----------+--------------+     Summary: RIGHT: - No evidence of common femoral vein obstruction.  LEFT: - There is no evidence of deep vein thrombosis in the lower extremity.  - No cystic structure found in the popliteal fossa.  *See table(s) above for measurements and observations. Electronically signed by Monica Martinez MD on 08/19/2021 at 5:23:22 PM.    Final    ECHOCARDIOGRAM LIMITED  Result Date: 08/22/2021    ECHOCARDIOGRAM LIMITED REPORT   Patient Name:   Derek [name] Date of Exam: 08/22/2021 Medical Rec #:  [id number]   Height:       76.0 in Accession #:    [id number]  Weight:       267.9 lb Date of Birth:  [birth year]   BSA:          2.508 m Patient Age:    73 years    BP:           115/71 mmHg Patient Gender: M           HR:           80 bpm. Exam Location:  Inpatient Procedure: 2D Echo, Limited Echo and Intracardiac Opacification Agent Indications:    CHF  History:        Patient has prior history of Echocardiogram examinations. CAD,                 COPD; Risk Factors:Hypertension and Diabetes.  Sonographer:    Jyl Heinz Referring Phys: [id number] PAULA V ROSS IMPRESSIONS  1. Windows still challenging. With contrast, LV function is mildly reduced. Hypokinesis of the inferior, inferoseptal wall ; as well as anteroseptum as well. RV function is normal. IVC is normal and collapses RA  pressure 3. FINDINGS  Left Ventricle: Windows still challenging. With contrast, LV function is mildly reduced. Hypokinesis of the inferior, inferoseptal wall ; as well as anteroseptum as well. RV function is normal. IVC is normal and  collapses RA pressure 3.  LV Volumes (MOD) LV vol d, MOD A2C: 125.0 ml LV vol d, MOD A4C: 127.0 ml LV vol s, MOD A2C: 53.0 ml LV vol s, MOD A4C: 53.3 ml LV SV MOD A2C:     72.0 ml LV SV MOD A4C:     127.0 ml LV SV MOD BP:      75.6 ml IVC IVC diam: 1.30 cm Phineas Inches Electronically signed by Phineas Inches Signature Date/Time: 08/22/2021/1:49:49 PM    Final    ECHOCARDIOGRAM LIMITED  Result Date: 08/21/2021    ECHOCARDIOGRAM LIMITED REPORT   Patient Name:   Derek [name] Date of Exam: 08/21/2021 Medical Rec #:  [id number]   Height:       76.0 in Accession #:    [id number]  Weight:       272.0 lb Date of Birth:  Dec 20, 1940   BSA:          2.524 m Patient Age:    4 years    BP:           127/64 mmHg Patient Gender: M           HR:           83 bpm. Exam Location:  Inpatient Procedure: Limited Echo, Limited Color Doppler and Cardiac Doppler Indications:    CHF-Acute Diastolic Y40.34  History:        Patient has prior history of Echocardiogram examinations, most                 recent 06/07/2021. Cardiomyopathy; Risk Factors:Hypertension and                 Diabetes.  Sonographer:    Bernadene Person RDCS Referring Phys: [id number] AMRIT ADHIKARI IMPRESSIONS  1. Poor acoustic windows limt study LVEF may be mildly depressed REcomm limited echo with Defiinity to confirm wall motion Images suggestive that inferior/inferoseptal walls are hypokinetic . There is moderate left ventricular hypertrophy.  2. Right ventricular systolic function is low normal. The right ventricular size is mildly enlarged. There is normal pulmonary artery systolic pressure.  3. MR is eccentric, directed posterior into LA. Probable moderate . Moderate mitral valve regurgitation.  4. The aortic valve is tricuspid. Aortic valve regurgitation is not visualized. Aortic valve sclerosis/calcification is present, without any evidence of aortic stenosis.  5. Aortic dilatation noted. There is mild dilatation of the aortic root, measuring 41 mm. There is mild  dilatation of the ascending aorta, measuring 38 mm.  6. The inferior vena cava is normal in size with greater than 50% respiratory variability, suggesting right atrial pressure of 3 mmHg. FINDINGS  Left Ventricle: Poor acoustic windows limt study LVEF may be mildly depressed REcomm limited echo with Defiinity to confirm wall motion Images suggestive that inferior/inferoseptal walls are hypokinetic. The left ventricular internal cavity size was normal in size. There is moderate left ventricular hypertrophy. Right Ventricle: The right ventricular size is mildly enlarged. Right ventricular systolic function is low normal. There is normal pulmonary artery systolic pressure. The tricuspid regurgitant velocity is 2.38 m/s, and with an assumed right atrial pressure of 3 mmHg, the estimated right ventricular systolic pressure is 38.7 mmHg. Left Atrium: Left atrial size was normal in size. Right Atrium: Right atrial  size was normal in size. Pericardium: There is no evidence of pericardial effusion. Mitral Valve: MR is eccentric, directed posterior into LA. Probable moderate. There is mild thickening of the mitral valve leaflet(s). Moderate mitral valve regurgitation. Tricuspid Valve: The tricuspid valve is grossly normal. Tricuspid valve regurgitation is mild. Aortic Valve: The aortic valve is tricuspid. Aortic valve regurgitation is not visualized. Aortic valve sclerosis/calcification is present, without any evidence of aortic stenosis. Pulmonic Valve: The pulmonic valve was not well visualized. Pulmonic valve regurgitation is not visualized. Aorta: Aortic dilatation noted. There is mild dilatation of the aortic root, measuring 41 mm. There is mild dilatation of the ascending aorta, measuring 38 mm. Venous: The inferior vena cava is normal in size with greater than 50% respiratory variability, suggesting right atrial pressure of 3 mmHg. IAS/Shunts: The interatrial septum was not assessed. LEFT VENTRICLE PLAX 2D LVIDd:          4.50 cm     Diastology LVIDs:         2.60 cm     LV e' medial:    3.38 cm/s LV PW:         1.50 cm     LV E/e' medial:  19.7 LV IVS:        1.30 cm     LV e' lateral:   6.08 cm/s LVOT diam:     2.10 cm     LV E/e' lateral: 10.9 LV SV:         62 LV SV Index:   25 LVOT Area:     3.46 cm  LV Volumes (MOD) LV vol d, MOD A2C: 96.0 ml LV vol d, MOD A4C: 81.0 ml LV vol s, MOD A2C: 51.6 ml LV vol s, MOD A4C: 41.9 ml LV SV MOD A2C:     44.4 ml LV SV MOD A4C:     81.0 ml LV SV MOD BP:      41.0 ml RIGHT VENTRICLE RV S prime:     13.50 cm/s TAPSE (M-mode): 2.5 cm LEFT ATRIUM           Index LA diam:      3.90 cm 1.54 cm/m LA Vol (A4C): 46.2 ml 18.30 ml/m  AORTIC VALVE LVOT Vmax:   103.00 cm/s LVOT Vmean:  69.400 cm/s LVOT VTI:    0.180 m  AORTA Ao Root diam: 4.10 cm Ao Asc diam:  3.80 cm MITRAL VALVE                  TRICUSPID VALVE MV Area (PHT): 3.46 cm       TR Peak grad:   22.7 mmHg MV Decel Time: 219 msec       TR Vmax:        238.00 cm/s MR Peak grad:    118.8 mmHg MR Mean grad:    81.0 mmHg    SHUNTS MR Vmax:         545.00 cm/s  Systemic VTI:  0.18 m MR Vmean:        423.0 cm/s   Systemic Diam: 2.10 cm MR PISA:         1.57 cm MR PISA Eff ROA: 11 mm MR PISA Radius:  0.50 cm MV E velocity: 66.50 cm/s MV A velocity: 48.00 cm/s MV E/A ratio:  1.39 Dorris Carnes MD Electronically signed by Dorris Carnes MD Signature Date/Time: 08/21/2021/4:03:52 PM    Final    US THORACENTESIS ASP PLEURAL SPACE W/IMG GUIDE  Result Date:  08/23/2021 INDICATION: Patient with a history of large B-cell lymphoma and hospital admission for respiratory failure secondary to COVID pneumonia. Patient found to have right pleural effusion. Interventional radiology asked to perform a diagnostic and therapeutic thoracentesis. EXAM: ULTRASOUND GUIDED THORACENTESIS MEDICATIONS: 1% lidocaine 10 mL COMPLICATIONS: None immediate. PROCEDURE: An ultrasound guided thoracentesis was thoroughly discussed with the patient and questions answered. The  benefits, risks, alternatives and complications were also discussed. The patient understands and wishes to proceed with the procedure. Written consent was obtained. Ultrasound was performed to localize and mark an adequate pocket of fluid in the right chest. The area was then prepped and draped in the normal sterile fashion. 1% Lidocaine was used for local anesthesia. Under ultrasound guidance a 6 Fr Safe-T-Centesis catheter was introduced. Thoracentesis was performed. The catheter was removed and a dressing applied. FINDINGS: A total of approximately 500 mL of light brown fluid was removed. Samples were sent to the laboratory as requested by the clinical team. IMPRESSION: Successful ultrasound guided right thoracentesis yielding 500 mL of pleural fluid. Read by: Soyla Dryer, NP Electronically Signed   By: Markus Daft M.D.   On: 08/23/2021 15:45    Microbiology Recent Results (from the past 240 hour(s))  Resp Panel by RT-PCR (Flu A&B, Covid) Nasopharyngeal Swab     Status: Abnormal   Collection Time: 08/06/2021  3:52 PM   Specimen: Nasopharyngeal Swab; Nasopharyngeal(NP) swabs in vial transport medium  Result Value Ref Range Status   SARS Coronavirus 2 by RT PCR POSITIVE (A) NEGATIVE Final    Comment: CRITICAL RESULT CALLED TO, READ BACK BY AND VERIFIED WITH: RN N WHEATLEY AT 1957 07/25/2021 CRUICKSHANK A (NOTE) SARS-CoV-2 target nucleic acids are DETECTED.  The SARS-CoV-2 RNA is generally detectable in upper respiratory specimens during the acute phase of infection. Positive results are indicative of the presence of the identified virus, but do not rule out bacterial infection or co-infection with other pathogens not detected by the test. Clinical correlation with patient history and other diagnostic information is necessary to determine patient infection status. The expected result is Negative.  Fact Sheet for Patients: EntrepreneurPulse.com.au  Fact Sheet for Healthcare  Providers: IncredibleEmployment.be  This test is not yet approved or cleared by the [place] FDA and  has been authorized for detection and/or diagnosis of SARS-CoV-2 by FDA under an Emergency Use Authorization (EUA).  This EUA will remain in effect (mean ing this test can be used) for the duration of  the COVID-19 declaration under Section 564(b)(1) of the Act, 21 U.S.C. section 360bbb-3(b)(1), unless the authorization is terminated or revoked sooner.     Influenza A by PCR NEGATIVE NEGATIVE Final   Influenza B by PCR NEGATIVE NEGATIVE Final    Comment: (NOTE) The Xpert Xpress SARS-CoV-2/FLU/RSV plus assay is intended as an aid in the diagnosis of influenza from Nasopharyngeal swab specimens and should not be used as a sole basis for treatment. Nasal washings and aspirates are unacceptable for Xpert Xpress SARS-CoV-2/FLU/RSV testing.  Fact Sheet for Patients: EntrepreneurPulse.com.au  Fact Sheet for Healthcare Providers: IncredibleEmployment.be  This test is not yet approved or cleared by the [place] FDA and has been authorized for detection and/or diagnosis of SARS-CoV-2 by FDA under an Emergency Use Authorization (EUA). This EUA will remain in effect (meaning this test can be used) for the duration of the COVID-19 declaration under Section 564(b)(1) of the Act, 21 U.S.C. section 360bbb-3(b)(1), unless the authorization is terminated or revoked.  Performed at [facility],  [place] [address] [place], [place] [address]   Culture, blood (Routine X 2) w Reflex to ID Panel     Status: None   Collection Time: 08/17/2021 10:02 PM   Specimen: BLOOD  Result Value Ref Range Status   Specimen Description   Final    BLOOD PORTA CATH Performed at [facility] [address], [place], [place] [address]    Special Requests   Final    BOTTLES DRAWN AEROBIC AND ANAEROBIC Blood Culture  adequate volume Performed at [facility] [address] [place], [place] [address]    Culture   Final    NO GROWTH 5 DAYS Performed at [facility], [place] [address] [place], [place] [address]    Report Status 08/24/2021 FINAL  Final  Culture, blood (Routine X 2) w Reflex to ID Panel     Status: None   Collection Time: 08/19/21  4:24 AM   Specimen: BLOOD  Result Value Ref Range Status   Specimen Description   Final    BLOOD PORTA CATH Performed at [facility] [address], [place], [place] [address]    Special Requests   Final    BOTTLES DRAWN AEROBIC AND ANAEROBIC Blood Culture results may not be optimal due to an excessive volume of blood received in culture bottles Performed at [facility] [address] [place], [place] [address]    Culture   Final    NO GROWTH 5 DAYS Performed at [facility], [place] [address] [place], [place] [address]    Report Status 08/24/2021 FINAL  Final  MRSA Next Gen by PCR, Nasal     Status: None   Collection Time: 08/22/21  9:27 AM   Specimen: Nasal Mucosa; Nasal Swab  Result Value Ref Range Status   MRSA by PCR Next Gen NOT DETECTED NOT DETECTED Final    Comment: (NOTE) The GeneXpert MRSA Assay (FDA approved for NASAL specimens only), is one component of a comprehensive MRSA colonization surveillance program. It is not intended to diagnose MRSA infection nor to guide or monitor treatment for MRSA infections. Test performance is not FDA approved in patients less than 73 years old. Performed at [facility], [place] [address], [place], [place] [address]   Respiratory (~20 pathogens) panel by PCR     Status: None   Collection Time: 08/22/21  1:40 PM   Specimen: Nasopharyngeal Swab; Respiratory  Result Value Ref Range Status   Adenovirus NOT DETECTED NOT DETECTED Final   Coronavirus 229E NOT DETECTED NOT DETECTED Final    Comment: (NOTE) The  Coronavirus on the Respiratory Panel, DOES NOT test for the novel  Coronavirus (2019 nCoV)    Coronavirus HKU1 NOT DETECTED NOT DETECTED Final   Coronavirus NL63 NOT DETECTED NOT DETECTED Final   Coronavirus OC43 NOT DETECTED NOT DETECTED Final   Metapneumovirus NOT DETECTED NOT DETECTED Final   Rhinovirus / Enterovirus NOT DETECTED NOT DETECTED Final   Influenza A NOT DETECTED NOT DETECTED Final   Influenza B NOT DETECTED NOT DETECTED Final   Parainfluenza Virus 1 NOT DETECTED NOT DETECTED Final   Parainfluenza Virus 2 NOT DETECTED NOT DETECTED Final   Parainfluenza Virus 3 NOT DETECTED NOT DETECTED Final   Parainfluenza Virus 4 NOT DETECTED NOT DETECTED Final   Respiratory Syncytial Virus NOT DETECTED NOT DETECTED Final   Bordetella pertussis NOT DETECTED NOT DETECTED Final   Bordetella Parapertussis NOT DETECTED NOT DETECTED  Final   Chlamydophila pneumoniae NOT DETECTED NOT DETECTED Final   Mycoplasma pneumoniae NOT DETECTED NOT DETECTED Final    Comment: Performed at [facility], [place] [address] [place], [place] [address]  Body fluid culture w Gram Stain     Status: None   Collection Time: 08/23/21  3:04 PM   Specimen: PATH Cytology Pleural fluid  Result Value Ref Range Status   Specimen Description   Final    PLEURAL Performed at [facility] [address], [place], [place] [address]    Special Requests   Final    NONE Performed at [facility], [place] [address] [place], [place] [address]    Gram Stain   Final    RARE WBC PRESENT, PREDOMINANTLY MONONUCLEAR NO ORGANISMS SEEN    Culture   Final    NO GROWTH 3 DAYS Performed at [facility], [place] [address] [place], [place] [address]    Report Status 2021-09-05 FINAL  Final    Lab Basic Metabolic Panel: Recent Labs  Lab 08/24/21 0343 08/24/21 2227 08/25/21 0400 08/25/21 1023 08/26/21 0020  NA 127* 128* 129* 128* 130*  K 4.3 4.5 4.6 4.2 4.2  CL 92* 92* 94* 93*  96*  CO2 23 21* 21* 21* 22  GLUCOSE 391* 479* 511* 424* 218*  BUN 64* 78* 88* 91* 105*  CREATININE 2.55* 3.05* 3.38* 3.58* 3.93*  CALCIUM 8.0* 8.1* 8.1* 8.2* 8.4*  MG  --   --   --   --  2.9*   Liver Function Tests: No results for input(s): AST, ALT, ALKPHOS, BILITOT, PROT, ALBUMIN in the last 168 hours. No results for input(s): LIPASE, AMYLASE in the last 168 hours. No results for input(s): AMMONIA in the last 168 hours. CBC: Recent Labs  Lab 08/21/21 0432 08/22/21 0409 08/23/21 0330 08/24/21 0343 08/26/21 0141  WBC 0.9* 1.8* 3.7* 5.3 6.6  NEUTROABS 0.2* 0.9* 2.2 4.5 5.1  HGB 10.2* 10.4* 11.0* 10.2* 11.5*  HCT 31.8* 32.7* 34.0* 31.7* 34.9*  MCV 85.5 84.9 83.1 84.1 83.3  PLT 115* 107* 104* 93* 94*   Cardiac Enzymes: No results for input(s): CKTOTAL, CKMB, CKMBINDEX, TROPONINI in the last 168 hours. Sepsis Labs: Recent Labs  Lab 08/22/21 0409 08/22/21 1340 08/23/21 0330 08/24/21 0343 08/26/21 0141  PROCALCITON  --  0.21 0.33 0.43  --   WBC 1.8*  --  3.7* 5.3 6.6    Procedures/Operations  None   Alma Friendly Sep 05, 2021, 1:53 PM

## 2021-09-19 DEATH — deceased

## 2022-10-09 IMAGING — XA IR IMAGING GUIDED PORT INSERTION
1 series · 1 of 1 positions shown · non-contrast
Comparison: PET-CT-02/25/2021

INDICATION: Recurrent lymphoma. In need of durable intravenous access for
chemotherapy administration.

EXAM:
IMPLANTED PORT A CATH PLACEMENT WITH ULTRASOUND AND FLUOROSCOPIC
GUIDANCE

[Series 1: care single · 1 of 1 slices shown]
[im 1/1]
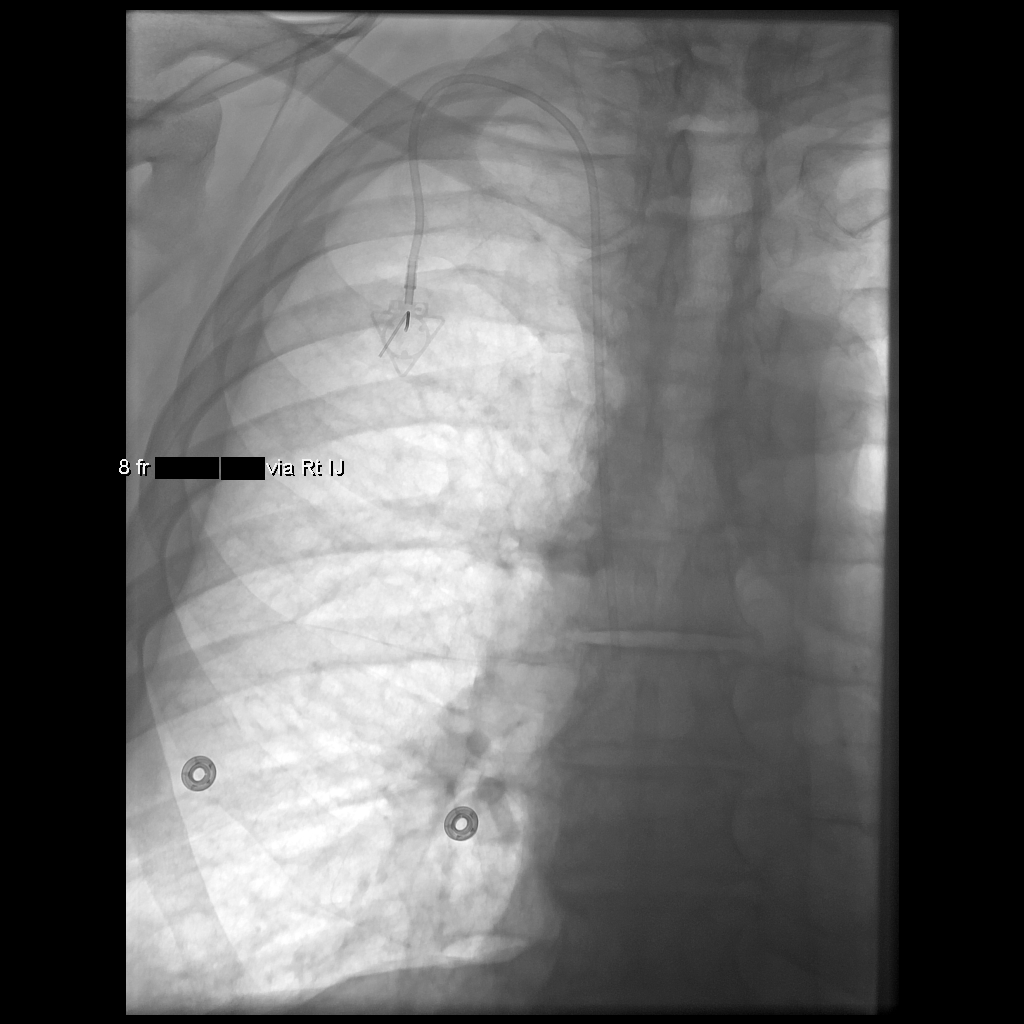

[1 of 1 positions shown; findings below may reference images not displayed]

MEDICATIONS:
None

ANESTHESIA/SEDATION:
Moderate (conscious) sedation was employed during this procedure. A
total of Versed 2 mg and Fentanyl 100 mcg was administered
intravenously.

Moderate Sedation Time: 28 minutes. The patient's level of
consciousness and vital signs were monitored continuously by
radiology nursing throughout the procedure under my direct
supervision.

CONTRAST:  None

FLUOROSCOPY TIME:  30 seconds (23 mGy)

COMPLICATIONS:
None immediate.

PROCEDURE:
The procedure, risks, benefits, and alternatives were explained to
the patient. Questions regarding the procedure were encouraged and
answered. The patient understands and consents to the procedure.

The right neck and chest were prepped with chlorhexidine in a
sterile fashion, and a sterile drape was applied covering the
operative field. Maximum barrier sterile technique with sterile
gowns and gloves were used for the procedure. A timeout was
performed prior to the initiation of the procedure. Local anesthesia
was provided with 1% lidocaine with epinephrine.

After creating a small venotomy incision, a micropuncture kit was
utilized to access the internal jugular vein. Real-time ultrasound
guidance was utilized for vascular access including the acquisition
of a permanent ultrasound image documenting patency of the accessed
vessel. The microwire was utilized to measure appropriate catheter
length.

A subcutaneous port pocket was then created along the upper chest
wall utilizing a combination of sharp and blunt dissection. The
pocket was irrigated with sterile saline. A single lumen "ISP" sized
power injectable port was chosen for placement. The 8 Fr catheter
was tunneled from the port pocket site to the venotomy incision. The
port was placed in the pocket. The external catheter was trimmed to
appropriate length. At the venotomy, an 8 Fr peel-away sheath was
placed over a guidewire under fluoroscopic guidance. The catheter
was then placed through the sheath and the sheath was removed. Final
catheter positioning was confirmed and documented with a
fluoroscopic spot radiograph. The port was accessed with Aisha Umar Grema
needle, aspirated and flushed with heparinized saline.

The venotomy site was closed with an interrupted 4-0 Vicryl suture.
The port pocket incision was closed with interrupted 2-0 Vicryl
suture. Dermabond and Tiger were applied to both incisions.
Dressings were applied. The patient tolerated the procedure well
without immediate post procedural complication.
FINDINGS: After catheter placement, the tip lies within the superior
cavoatrial junction. The catheter aspirates and flushes normally and
is ready for immediate use.
IMPRESSION: Successful placement of a right internal jugular approach power
injectable Port-A-Cath. The catheter is ready for immediate use.

## 2022-12-07 IMAGING — CR DG CHEST 2V
2 series · 2 of 2 positions shown · non-contrast
Comparison: April 19, 2021

CLINICAL DATA: Cough and dyspnea upon exertion.

EXAM:
CHEST - 2 VIEW

[w chest pa]
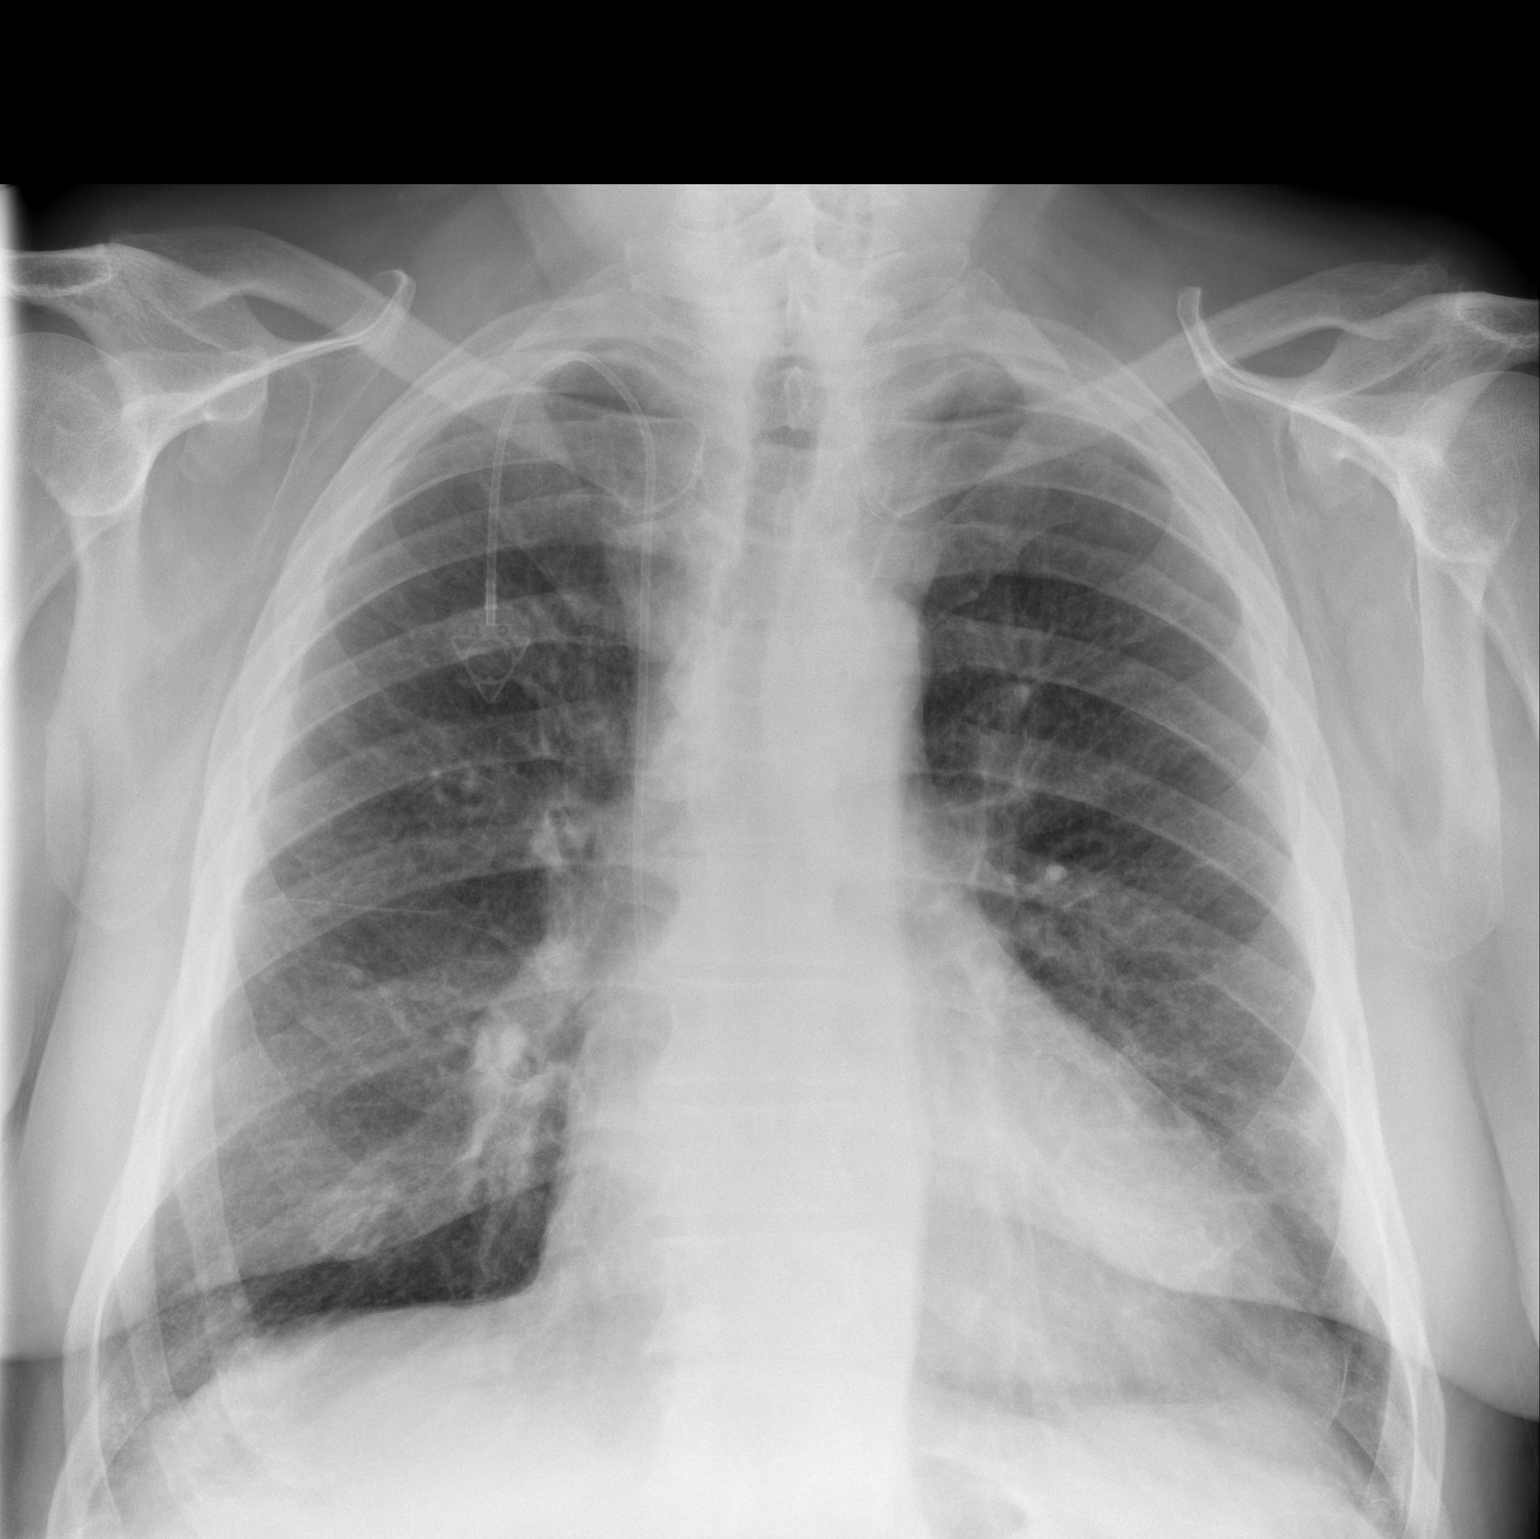

[w chest lat]
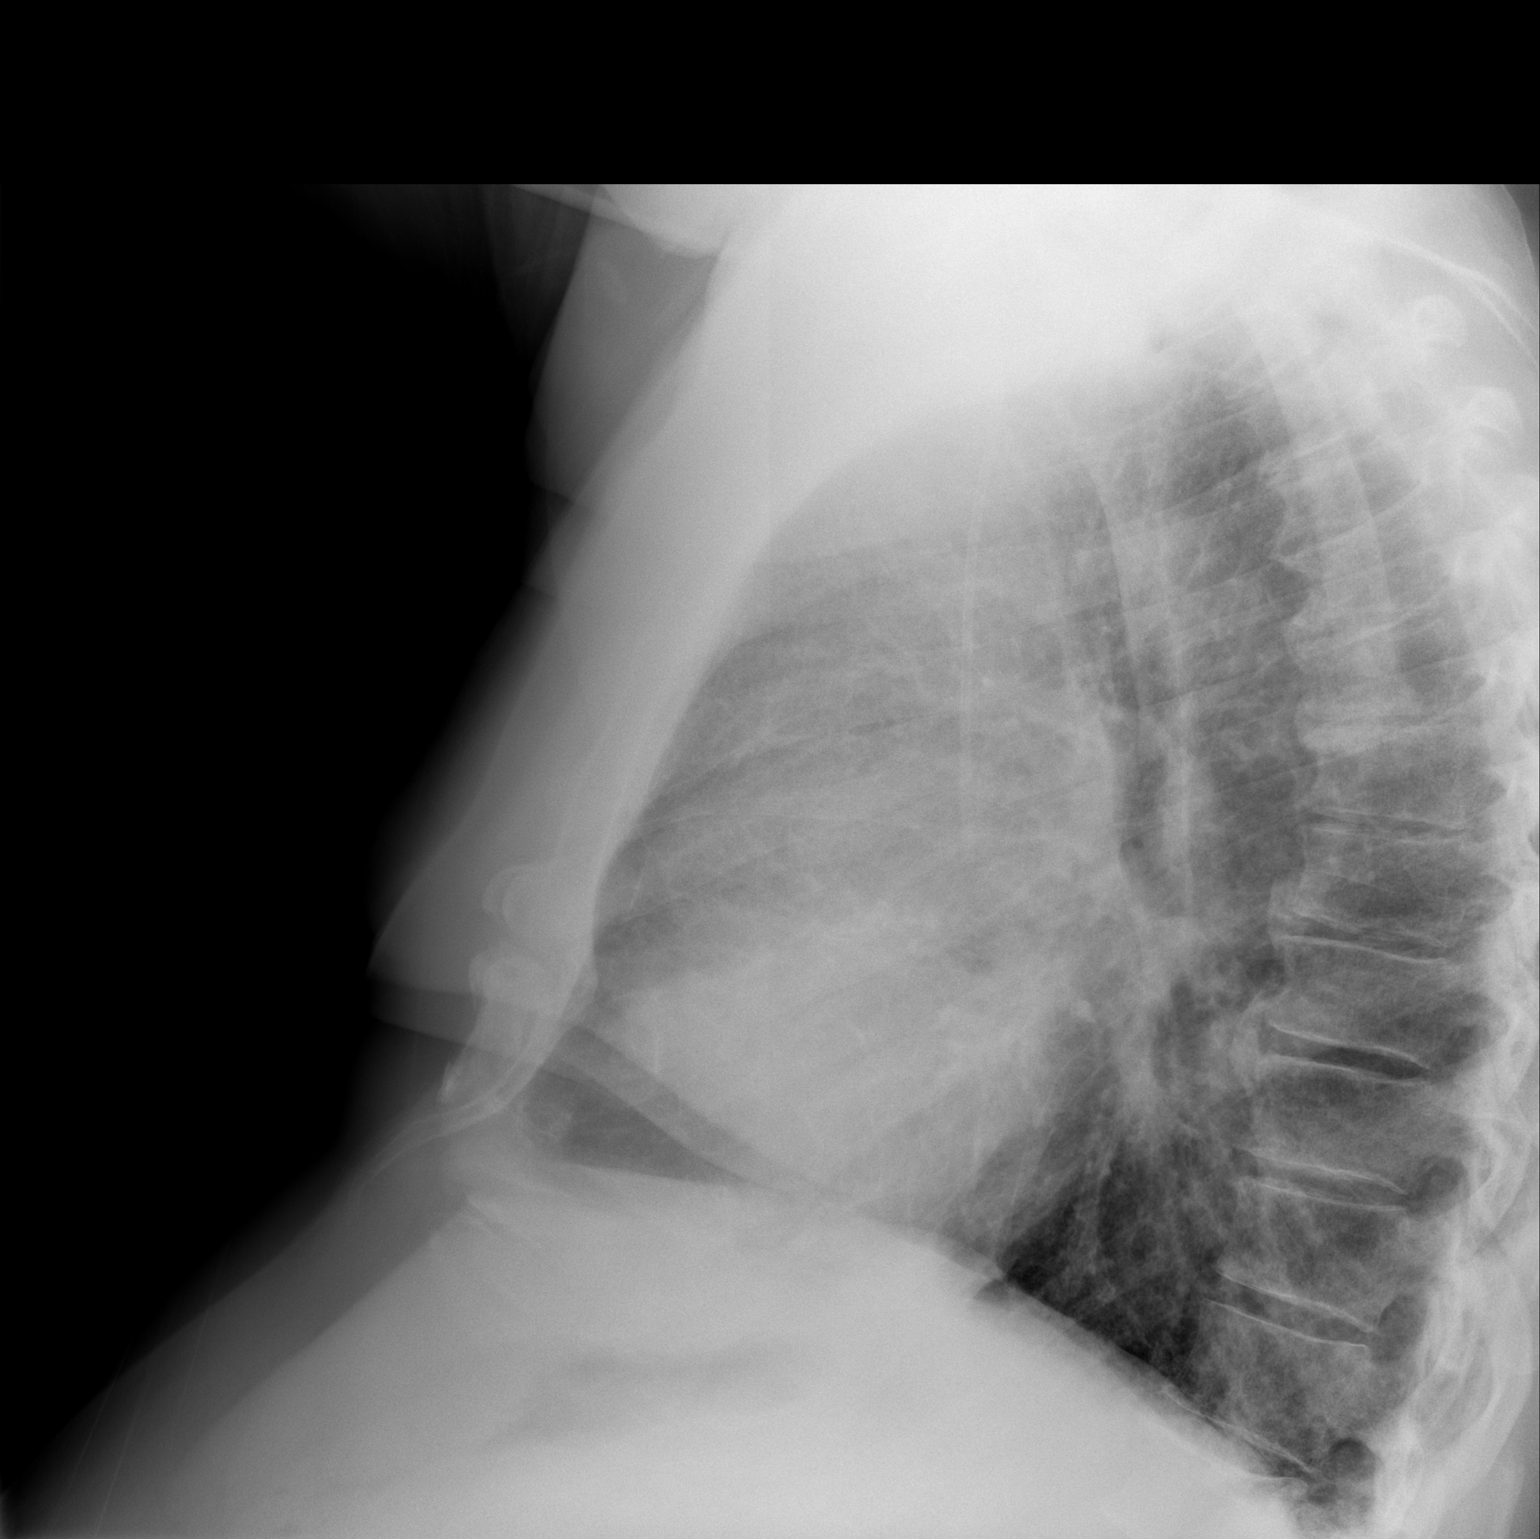

[2 of 2 positions shown; findings below may reference images not displayed]

FINDINGS: There is stable right-sided venous Port-A-Cath positioning. There is
no evidence of acute infiltrate, pleural effusion or pneumothorax.
Stable, ill-defined nodular opacities are seen within the lateral
aspect of the right lung base. The cardiac silhouette is borderline
in size and unchanged in appearance. The visualized skeletal
structures are unremarkable.
IMPRESSION: Stable exam without active cardiopulmonary disease.
